# Patient Record
Sex: Female | Born: 1940
Health system: Southern US, Community
[De-identification: ages and names within clinical notes are randomized; demographics above are authoritative.]

## PROBLEM LIST (undated history)

## (undated) DIAGNOSIS — I219 Acute myocardial infarction, unspecified: Secondary | ICD-10-CM

## (undated) DIAGNOSIS — C799 Secondary malignant neoplasm of unspecified site: Secondary | ICD-10-CM

## (undated) DIAGNOSIS — M1990 Unspecified osteoarthritis, unspecified site: Secondary | ICD-10-CM

## (undated) DIAGNOSIS — I2510 Atherosclerotic heart disease of native coronary artery without angina pectoris: Secondary | ICD-10-CM

## (undated) DIAGNOSIS — C50412 Malignant neoplasm of upper-outer quadrant of left female breast: Secondary | ICD-10-CM

## (undated) DIAGNOSIS — I10 Essential (primary) hypertension: Secondary | ICD-10-CM

## (undated) DIAGNOSIS — E785 Hyperlipidemia, unspecified: Secondary | ICD-10-CM

## (undated) DIAGNOSIS — I739 Peripheral vascular disease, unspecified: Secondary | ICD-10-CM

## (undated) HISTORY — PX: KNEE ARTHROSCOPY: SUR90

## (undated) HISTORY — DX: Malignant neoplasm of upper-outer quadrant of left female breast: C50.412

## (undated) HISTORY — DX: Atherosclerotic heart disease of native coronary artery without angina pectoris: I25.10

## (undated) HISTORY — DX: Unspecified osteoarthritis, unspecified site: M19.90

---

## 1980-11-25 HISTORY — PX: ABDOMINAL HYSTERECTOMY: SHX81

## 1998-11-25 HISTORY — PX: NEPHRECTOMY LIVING DONOR: SUR877

## 2000-08-26 ENCOUNTER — Ambulatory Visit (HOSPITAL_COMMUNITY): Admission: RE | Admit: 2000-08-26 | Discharge: 2000-08-26 | Payer: Self-pay | Admitting: Gastroenterology

## 2000-08-26 ENCOUNTER — Encounter (INDEPENDENT_AMBULATORY_CARE_PROVIDER_SITE_OTHER): Payer: Self-pay

## 2003-09-26 ENCOUNTER — Emergency Department (HOSPITAL_COMMUNITY): Admission: EM | Admit: 2003-09-26 | Discharge: 2003-09-27 | Payer: Self-pay | Admitting: Emergency Medicine

## 2003-10-01 ENCOUNTER — Emergency Department (HOSPITAL_COMMUNITY): Admission: EM | Admit: 2003-10-01 | Discharge: 2003-10-01 | Payer: Self-pay | Admitting: Emergency Medicine

## 2003-11-25 ENCOUNTER — Inpatient Hospital Stay (HOSPITAL_COMMUNITY): Admission: EM | Admit: 2003-11-25 | Discharge: 2003-11-29 | Payer: Self-pay

## 2003-11-26 HISTORY — PX: PERCUTANEOUS CORONARY STENT INTERVENTION (PCI-S): SHX6016

## 2004-04-07 ENCOUNTER — Encounter: Admission: RE | Admit: 2004-04-07 | Discharge: 2004-04-07 | Payer: Self-pay | Admitting: Orthopedic Surgery

## 2004-11-22 DIAGNOSIS — I219 Acute myocardial infarction, unspecified: Secondary | ICD-10-CM

## 2004-11-22 HISTORY — DX: Acute myocardial infarction, unspecified: I21.9

## 2008-12-27 ENCOUNTER — Ambulatory Visit: Payer: Self-pay | Admitting: Vascular Surgery

## 2010-05-01 ENCOUNTER — Emergency Department (HOSPITAL_COMMUNITY): Admission: EM | Admit: 2010-05-01 | Discharge: 2010-05-01 | Payer: Self-pay | Admitting: Emergency Medicine

## 2011-01-07 ENCOUNTER — Other Ambulatory Visit (INDEPENDENT_AMBULATORY_CARE_PROVIDER_SITE_OTHER): Payer: BC Managed Care – PPO

## 2011-01-07 DIAGNOSIS — I6529 Occlusion and stenosis of unspecified carotid artery: Secondary | ICD-10-CM

## 2011-01-15 NOTE — Procedures (Unsigned)
CAROTID DUPLEX EXAM  INDICATION:  Followup carotid disease.  HISTORY: Diabetes:  No. Cardiac:  Yes. Hypertension:  Yes. Smoking:  Previous. Previous Surgery:  No. CV History: Amaurosis Fugax No, Paresthesias No, Hemiparesis No                                      RIGHT             LEFT Brachial systolic pressure:         108               104 Brachial Doppler waveforms:         WNL               WNL Vertebral direction of flow:        Antegrade         Antegrade DUPLEX VELOCITIES (cm/sec) CCA peak systolic                   84                83 ECA peak systolic                   295               297 ICA peak systolic                   95                79 ICA end diastolic                   32                27 PLAQUE MORPHOLOGY:                  Heterogeneous     Heterogeneous PLAQUE AMOUNT:                      Moderate          Moderate PLAQUE LOCATION:                    CCA/ECA           CCA/ECA  IMPRESSION: 1. 1% to 39% bilateral internal carotid artery plaquing. 2. Abnormal bilateral external carotid artery. 3. Bilateral vertebral arteries are within normal limits.  ___________________________________________ V. Charlena Cross, MD  LT/MEDQ  D:  01/07/2011  T:  01/07/2011  Job:  [id number]

## 2011-02-11 LAB — CBC
HCT: 43.1 % (ref 36.0–46.0)
Hemoglobin: 14.4 g/dL (ref 12.0–15.0)
MCHC: 33.5 g/dL (ref 30.0–36.0)
MCV: 90.5 fL (ref 78.0–100.0)
Platelets: 167 10*3/uL (ref 150–400)
RBC: 4.77 MIL/uL (ref 3.87–5.11)
RDW: 13.5 % (ref 11.5–15.5)
WBC: 6.1 10*3/uL (ref 4.0–10.5)

## 2011-02-11 LAB — DIFFERENTIAL
Basophils Absolute: 0.1 10*3/uL (ref 0.0–0.1)
Basophils Relative: 1 % (ref 0–1)
Eosinophils Absolute: 0.1 10*3/uL (ref 0.0–0.7)
Eosinophils Relative: 1 % (ref 0–5)
Lymphocytes Relative: 26 % (ref 12–46)
Lymphs Abs: 1.6 10*3/uL (ref 0.7–4.0)
Monocytes Absolute: 0.5 10*3/uL (ref 0.1–1.0)
Monocytes Relative: 9 % (ref 3–12)
Neutro Abs: 3.8 10*3/uL (ref 1.7–7.7)
Neutrophils Relative %: 64 % (ref 43–77)

## 2011-02-11 LAB — COMPREHENSIVE METABOLIC PANEL
ALT: 28 U/L (ref 0–35)
AST: 30 U/L (ref 0–37)
Albumin: 3.7 g/dL (ref 3.5–5.2)
Alkaline Phosphatase: 52 U/L (ref 39–117)
BUN: 25 mg/dL — ABNORMAL HIGH (ref 6–23)
CO2: 24 mEq/L (ref 19–32)
Calcium: 8.9 mg/dL (ref 8.4–10.5)
Chloride: 108 mEq/L (ref 96–112)
Creatinine, Ser: 0.97 mg/dL (ref 0.4–1.2)
GFR calc Af Amer: >60 mL/min (ref >60)
GFR calc non Af Amer: 57 mL/min — ABNORMAL LOW (ref >60)
Glucose, Bld: 115 mg/dL — ABNORMAL HIGH (ref 70–99)
Potassium: 4.1 mEq/L (ref 3.5–5.1)
Sodium: 138 mEq/L (ref 135–145)
Total Bilirubin: 1 mg/dL (ref 0.3–1.2)
Total Protein: 6.2 g/dL (ref 6.0–8.3)

## 2011-02-11 LAB — CK: Total CK: 174 U/L (ref 7–177)

## 2011-04-09 NOTE — Procedures (Signed)
CAROTID DUPLEX EXAM   INDICATION:  Follow-up evaluation of known carotid artery disease.   HISTORY:  Diabetes:  No.  Cardiac:  MI with PTCA in 11/2003.  Hypertension:  Medically controlled.  Smoking:  No.  Previous Surgery:  No.  CV History:  Previous duplex on 11/19/06 revealed less than 40% ICA  stenosis bilaterally.  Amaurosis Fugax No, Paresthesias No, Hemiparesis No.                                       RIGHT             LEFT  Brachial systolic pressure:         120               116  Brachial Doppler waveforms:         Triphasic         Triphasic  Vertebral direction of flow:        Antegrade         Antegrade  DUPLEX VELOCITIES (cm/sec)  CCA peak systolic                   66                140  ECA peak systolic                   360               202  ICA peak systolic                   104               127  ICA end diastolic                   42                43  PLAQUE MORPHOLOGY:                  Mixed, irregular  Calcified  PLAQUE AMOUNT:                      Mild-to-moderate  Moderate  PLAQUE LOCATION:                    Proximal ICA      Proximal ICA, ECA   IMPRESSION:  1. Bilateral external carotid artery stenosis, right worse than left.  2. 20-39% right internal carotid artery stenosis.  3. 40-59% left internal carotid artery stenosis.   ___________________________________________  Wendy [name] [place], M.D.   MC/MEDQ  D:  12/27/2008  T:  12/27/2008  Job:  [id number]

## 2011-04-12 NOTE — Cardiovascular Report (Signed)
Wendy [name], Wendy [name] NO.:  [id number]   MEDICAL RECORD NO.:  [id number]                   PATIENT TYPE:  OBV   LOCATION:  2023                                 FACILITY:  [facility]   PHYSICIAN:  W. Ashley Royalty., M.D.         DATE OF BIRTH:  October 03, 1941   DATE OF PROCEDURE:  11/28/2003  DATE OF DISCHARGE:                              CARDIAC CATHETERIZATION   HISTORY:  A 70 year old female with unstable angina.  She also has  significant peripheral vascular disease with previous hyperlipidemia and  coronary artery disease and hypertension.   PROCEDURE:  Left heart catheterization with coronary angiograms and left  ventriculogram, stenting of the right coronary artery.   DESCRIPTION OF PROCEDURE:  The patient was brought to the cath lab and was  prepped and draped in the usual manner.  After Xylocaine anesthesia, a 6-  French sheath was placed in the right femoral artery percutaneously.  Angiograms were made using 6-French catheters and a 30-mL ventriculogram was  performed.  She was found to have two severe stenoses present within the  right coronary artery and arrangements were made to do stenting.  The sheath  was upgraded to a 7-French sheath and a second IV was started when the  previously placed heparin well would not work.  She was treated with  Angiomax achieving an ACT of greater than 200.  IV nitroglycerin was  administered.  Catheters used were a 7-French JR-3.5 guiding catheter with  side holes, HTF __________ guide wire that crossed the lesion easily.  The  two tandem lesions in the right coronary artery were predilated with a 2.5 x  12-mm Voyager balloon.  A 2.75 x 32-mm stent was placed across both stenoses  and was dilated to 9 atmospheres twice.  She had significant chest  discomfort that persisted throughout this.  The vessel was patent at that  point and the lesions were predilated with a 3.25 x 8--mm balloon at the  proximal  portion of the stent.  A 3.25 x 15-mm balloon was deployed to only  7 atmospheres in the proximal  mid portion of the stent and then a 2.75 x 15-  mm balloon was deployed through the length of the vessel to 14 atmospheres.  She was then administered IV nitroglycerin.  Her pain improved and post  dilatation angiograms were obtained.  She was thought to have a small  occlusion of the very distal right coronary artery that was noticed on the  post dilatation angiograms which may have been due to an embolus and she was  treated with Integrilin with single bolus and then constant infusion  following the procedure.  300 mg of Plavix was administered prior to the  procedure.  She left the catheterization lab in stable condition.   HEMODYNAMIC DATA:  1. Aorta postcontrast:  117/60.  2. LV postcontrast:  117/11-14.   ANGIOGRAPHIC DATA:  Left ventriculogram:  Performed in the  30 degree RAO  projection.  The aortic valve was normal.  The mitral valve was normal.  The  left ventricle appears normal in size.  Estimated ejection fraction was 60%.  Coronary arteries arise and distribute normally.  Mild calcification is  noted at the ostium of the right coronary artery and very mild calcification  noted in the left coronary system.  Left main coronary artery is normal.   Left anterior descending:  There is  a segmental 70-80% stenosis in a  somewhat small vessel after the diagonal branch.  Distal vessel is somewhat  small and is likely less than 2.5 mm.   The circumflex coronary artery has two marginal branches which appear free  of disease and contains only scattered irregularities.   The right coronary artery is a dominant vessel and contains a severe  segmental 10-12 mm proximal stenosis estimated at 80%.  This is followed by  a tandem 95% stenosis.  There is mild disease noted in the distal vessel.   Post dilatation angiograms reveal very minimal 10% narrowing at the very  proximal portion of  the vessel where the stent was overdilated and 0%  residual of the distal vessel.  Previous lesions sites are also 0%.  There  appears to be a very distal occlusion of the posterior descending artery  very distal at the apex.   IMPRESSION:  1. Successful stenting of the right coronary artery tandem lesions with     stenosis going from 80% to 0% and 95% to 0%.  2. Normal left ventricular function.  3. Residual atherosclerotic disease involving the left anterior descending     which likely will be addressed at a later date.                                               Darden Palmer., M.D.    WST/MEDQ  D:  11/28/2003  T:  11/28/2003  Job:  [id number]

## 2011-04-12 NOTE — Op Note (Signed)
[facility]  Patient:    Wendy [name], TROUTEN                       MRN: [id number] Proc. Date: 08/26/00 Adm. Date:  [id number] Attending:  Deneen Harts CC:         Elfredia Nevins, M.D., [place]   Operative Report  PROCEDURE:  Colonoscopic polypectomy  ENDOSCOPIST:  Griffith Citron, M.D.  INDICATIONS:  A 70 year old white female undergoing colonoscopy for surveillance purposes.  She has a sister diagnosed with colon cancer 21 years ago at the age of 75.  The patient has not undergone surveillance, asymptomatic.  DESCRIPTION OF PROCEDURE:  After reviewing the nature of the procedure with the patient including potential risks and complications, and after discussing alternative methods of diagnosis and treatment, informed consent was signed.  The patient was premedicated, receiving IV sedation totalling Versed 12 mg, fentanyl 125 mcg administered in divided doses prior to and during the course of the procedure.  The procedure was initiated using an Olympus pediatric PCF-140L video colonoscope.  This as inserted in the rectum after normal digital examination which revealed no evidence of perianal or intrarectal pathology.   The scope was could not be advanced beyond the splenic flexure due to the very large caliber of the colon, the patients obesity, and looping.  After 15 minutes, it was elected to exchange the pediatric for a standard caliber CF-140 scope. The intubation was repeated, and the scope could be advanced around the entire length of the colon to the cecum, although it was extremely difficulty, again due to the large caliber of the colon lumen, difficulty applying external pressure because of obesity, and a very tortuous colon.  The scope was then slowly withdrawn.  Multiple polyps were identified, three sessile polyps located in the hepatic flexure, splenic flexure, and sigmoid colon, each approximately 8 mm in diameter, benign  adenomatous in appearance.  These were resected with electrocautery snare, recovered, and submitted to pathology. Three additional diminutive polyps, hyperplastic appearing, were resected using hot biopsy forceps.  These, too, were distributed in the hepatic and splenic flexures and sigmoid colon.  No additional abnormality was noted. Retroflex view of the rectal vault was normal.  The colon was decompressed, scope withdrawn.  Procedure time was 1 hour and 15 minutes.  The patient tolerated this without difficulty, being maintained on Datascope monitor and low-flow oxygen.  She returned to the recovery room in stable condition.  Time 3, technical 3, preparation 1, total score equal 7.  ASSESSMENT:  Polyps, benign appearing, all resected and sent to pathology.  RECOMMENDATIONS: 1. Post polypectomy instructions were reviewed with the patient. 2. Follow up pathology. 3. Repeat colonoscopy in three years if adenomatous polyp identified on    pathology. DD:  08/26/00 TD:  08/26/00 Job: [id number]

## 2011-04-12 NOTE — H&P (Signed)
NAME:  Wendy [name], Wendy [name] NO.:  [id number]   MEDICAL RECORD NO.:  [id number]                   PATIENT TYPE:  OBV   LOCATION:  6524                                 FACILITY:  [facility]   PHYSICIAN:  Georga Hacking, M.D.             DATE OF BIRTH:  03/11/1941   DATE OF ADMISSION:  11/24/2003  DATE OF DISCHARGE:                                HISTORY & PHYSICAL   HISTORY OF PRESENT ILLNESS:  Wendy [name] is a 70 year old white woman  who is admitted to [facility] for further evaluation of chest pain.   The patient, who has no past history of cardiac disease, presented to the  emergency department this evening after experiencing an episode of chest  pain.  This was actually her fifth episode of chest pain in the last two  weeks.  The first three episodes occurred during the last two weeks.  They  were precipitated by activity such as walking.  In each instance, she  stopped the activity and the chest pain resolved within 15 minutes.  Yesterday, she experienced an episode of chest pain at rest.  It resolved  within 15 minutes.  Tonight, she experienced a fifth episode of chest pain;  this also occurred at rest, but took approximately 45 minutes to resolve.  It was somewhat more intense than the other episodes.  The chest pain is  described as a pressure in the left anterior chest.  It radiates to the left  arm.  It is also associated with a discomfort in her teeth.  There are no  exacerbating or ameliorating factors.  It appears not to be related to  position, meals, or respirations.  She is free of chest pain at this time.  There has been no associated dyspnea, diaphoresis, or nausea.  Tonight's  episode was ameliorated by the two nitroglycerin tablets administered by  EMS.   As noted above, the patient has no past history of cardiac disease,  including no history of chest pain, myocardial infarction, coronary artery  disease, congestive heart  failure, or arrhythmia.  She has a history of  hypertension and is on hydrochlorothiazide and Diovan.  There is no history  of hyperlipidemia, diabetes mellitus, or smoking.  There is a family history  of early coronary artery disease; her father died of a myocardial infarction  in his early 23s.   MEDICATIONS:  The patient takes Diovan and hydrochlorothiazide as described  above.   ALLERGIES:  She is not allergic to any medications.   SOCIAL HISTORY:  The patient lives alone.  She works at [employer].  She does not smoke, as described above.  She drinks alcohol very  rarely.   PAST SURGICAL HISTORY:  She has previously undergone a hysterectomy.  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   SIGNIFICANT INJURIES:  None.   FAMILY HISTORY:  Heart disease  in her father, as described above.  Her  mother died of emphysema.  She has two sisters who have diabetes mellitus.   REVIEW OF SYSTEMS:  Review of systems reveals no problems related to her  head, eyes, ears, nose, mouth, throat, lungs, gastrointestinal system,  genitourinary system, or extremities.  There is no history of neurologic or  psychiatric disorder.  There is no history of fever, chills, or weight loss.   PHYSICAL EXAMINATION:  VITAL SIGNS:  Blood pressure 117/43.  Pulse 88 and  regular.  Respirations 20.  Temperature 97.1.  GENERAL:  The patient was an older white woman in no discomfort.  She was  alert, oriented, appropriate, and responsive.  HEENT:  Head, eyes, nose, and mouth were normal.  NECK:  The neck was without thyromegaly or adenopathy.  Carotid pulses were  palpable bilaterally; soft bruits were heard bilaterally.  CARDIAC:  Examination revealed a normal S1 and S2.  There was no S3, S4,  murmur, rub, or click.  Cardiac rhythm was regular.  No chest wall  tenderness was noted.  LUNGS:  The lungs were clear.  ABDOMEN:  The abdomen was soft and nontender.  There was no mass,   hepatosplenomegaly, bruit, distention, rebound, guarding or rigidity.  Bowel  sounds were normal.  BREASTS, PELVIC, AND RECTAL:  Examinations were not performed as they were  not pertinent to the reason for acute care hospitalization.  EXTREMITIES:  The extremities were without edema, deviation, or deformity.  Radial and dorsalis pedal pulses were palpable bilaterally.  NEUROLOGIC:  Brief screening neurologic survey was unremarkable.   LABORATORY AND ACCESSORY CLINICAL DATA:  The electrocardiogram was normal.   The chest radiograph, according to the radiologist, demonstrated no evidence  of acute disease.   The initial set of cardiac markers revealed a myoglobin of 80.4, CK-MB 2.8,  and troponin less than 0.05.  The second set revealed a myoglobin of 83.0,  CK-MB 2.6, and troponin less than 0.05.  Potassium was 3.4, BUN 20, and  creatinine 1.0.  White count was 5.3 with a hemoglobin of 13.0 and  hematocrit of 38.0.  The remaining studies were pending at the time of this  dictation.   IMPRESSION:  1. Chest pain, rule out unstable angina.  The history is consistent with     cardiac ischemia.  2. Bilateral carotid bruits.  3. Hypertension.   PLAN:  1. Telemetry.  2. Serial cardiac enzymes.  3. Aspirin.  4. Heparin.  5. Intravenous nitroglycerin.  6. Metoprolol.  7. Fasting lipid profile.  8. Carotid Dopplers.  9. Further measures per Dr. Lacretia Nicks. Viann Fish.      Quita Skye. Waldon Reining, MD                   Georga Hacking, M.D.    MSC/MEDQ  D:  11/25/2003  T:  11/25/2003  Job:  [id number]   cc:   W. Ashley Royalty., M.D.  [address]  [place]  [place] [address]  Fax: [phone]

## 2011-04-12 NOTE — Discharge Summary (Signed)
NAMEARIELLAH, Wendy [name]                        ACCOUNT NO.:  [id number]   MEDICAL RECORD NO.:  [id number]                   PATIENT TYPE:  INP   LOCATION:  6525                                 FACILITY:  [facility]   PHYSICIAN:  Darden Palmer., M.D.         DATE OF BIRTH:  November 06, 1941   DATE OF ADMISSION:  11/24/2003  DATE OF DISCHARGE:  11/29/2003                                 DISCHARGE SUMMARY   FINAL DIAGNOSES:  1. 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     a. Status post stenting of the right coronary artery with a 2.75 x 32 mm        TAXUS drug-eluting stent.     b. Residual disease involving the left anterior descending of 80% after        the first diagonal branch.  2. Hypertension.  3. Hyperlipidemia.  4. Glucose intolerance.  5. Obesity.  6. 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  7. Peripheral vascular disease with carotid bruits-carotid Dopplers done     this admission.   PROCEDURE:  Cardiac catheterization with stenting of the right coronary  artery.   HISTORY:  A 70 year old female who has had recently diagnosed hypertension.  She developed chest discomfort with exertion, described as pressure-type  with some heaviness and some radiation to the left arm and her teeth.  She  had discomfort at rest.  It took 45 minutes to resolve and was more intense  in other episodes and presented to the emergency room where she was admitted  by the covering doctor for our call group.  Please see the previously  dictated History and Physical for the remainder of the details.   LABORATORY DATA:  Cholesterol 203, HDL 31, LDL 119.  Glucohemoglobin was  5.8.  Hemoglobin 13, hematocrit 38, platelet count was normal.  Potassium  3.4 on admission with a glucose of 128.  Subsequent glucoses remained about  100 at fasting levels.  CPK was 253 on admission with an MB of 6.6, troponin  was 0.08.  Troponin rose to 0.19.   HOSPITAL COURSE:  She  was placed on Lovenox as well as IV nitroglycerin and  did have some recurrent chest discomfort.  EKG remains normal.  She was  observed in the hospital and underwent cardiac catheterization on November 28, 2003.  Left ventricular function was normal.  Left main coronary was normal.  Left anterior descending had a segmental 70% stenosis after the diagonal  branch.  The vessel was somewhat small.  The diagonal branch had a 50%  proximal stenosis.  Circumflex was free of disease.  Right coronary artery  had a segmental 80% proximal stenosis and a 95% midvessel stenosis.  It was  subtotal.  The artery was a large dominant vessel.  She had stenting with a  32 x 2.75 mm TAXUS drug-eluting stent and then was postdilated.  She had an  excellent angiographic result.  She was found to have occlusion of the very  distal portion of the posterior descending artery which likely was a very  small embolus occurring at the time of the stent or angioplasty which she  tolerated well and was pain-free at the end of the procedure.  She had very  minimal elevation of MB at 5 units the next day.  EKG remained normal.  She  was asymptomatic and ambulatory in the hall.  She was treated with Plavix  and continued on Diovan and begun on Lipitor while in the hospital.  It was  elected to discharge her home and continue medical therapy to the LAD  stenosis, but if she is ischemic or has recurrent symptoms, consider  stenting of this vessel also.  She was seen by the financial counselor and  the social worker as her insurance is unknown.   DISCHARGE CONDITION:  She was discharged in improved condition the next day.  Cauterization site was normal.   DISCHARGE INSTRUCTIONS:  She was to stay out of work for one week.  She is  to report any recurrence of angina and is to call if there are problems.   DISCHARGE MEDICATIONS:  1. Lipitor 20 mg daily.  2. Aspirin 81 mg daily.  3. Plavix 75 mg daily.  4. Diovan/HCTZ  80/12.5 mg daily.  5. Nitroglycerin 1/150 sublingual p.r.n.                                                W. Ashley Royalty., M.D.    WST/MEDQ  D:  11/29/2003  T:  11/29/2003  Job:  [id number]

## 2013-09-28 ENCOUNTER — Other Ambulatory Visit (HOSPITAL_COMMUNITY): Payer: Self-pay | Admitting: Family Medicine

## 2013-09-28 DIAGNOSIS — Z139 Encounter for screening, unspecified: Secondary | ICD-10-CM

## 2013-10-05 ENCOUNTER — Ambulatory Visit (HOSPITAL_COMMUNITY)
Admission: RE | Admit: 2013-10-05 | Discharge: 2013-10-05 | Disposition: A | Discharge status: Home / Self Care | Payer: 59 | Source: Ambulatory Visit | Attending: Family Medicine | Admitting: Family Medicine

## 2013-10-05 ENCOUNTER — Other Ambulatory Visit (HOSPITAL_COMMUNITY): Payer: Self-pay | Admitting: Family Medicine

## 2013-10-05 DIAGNOSIS — Z139 Encounter for screening, unspecified: Secondary | ICD-10-CM

## 2013-10-05 DIAGNOSIS — N63 Unspecified lump in unspecified breast: Secondary | ICD-10-CM

## 2013-10-27 ENCOUNTER — Other Ambulatory Visit (HOSPITAL_COMMUNITY): Payer: Self-pay | Admitting: Family Medicine

## 2013-10-27 ENCOUNTER — Ambulatory Visit (HOSPITAL_COMMUNITY)
Admission: RE | Admit: 2013-10-27 | Discharge: 2013-10-27 | Disposition: A | Discharge status: Home / Self Care | Payer: 59 | Source: Ambulatory Visit | Attending: Family Medicine | Admitting: Family Medicine

## 2013-10-27 ENCOUNTER — Encounter (HOSPITAL_COMMUNITY): Payer: Self-pay

## 2013-10-27 DIAGNOSIS — Z139 Encounter for screening, unspecified: Secondary | ICD-10-CM

## 2013-10-27 DIAGNOSIS — N6320 Unspecified lump in the left breast, unspecified quadrant: Secondary | ICD-10-CM

## 2013-10-27 DIAGNOSIS — C50919 Malignant neoplasm of unspecified site of unspecified female breast: Secondary | ICD-10-CM | POA: Insufficient documentation

## 2013-10-27 DIAGNOSIS — C773 Secondary and unspecified malignant neoplasm of axilla and upper limb lymph nodes: Secondary | ICD-10-CM | POA: Insufficient documentation

## 2013-10-27 DIAGNOSIS — D0590 Unspecified type of carcinoma in situ of unspecified breast: Secondary | ICD-10-CM | POA: Insufficient documentation

## 2013-10-27 DIAGNOSIS — N63 Unspecified lump in unspecified breast: Secondary | ICD-10-CM

## 2013-10-27 HISTORY — DX: Essential (primary) hypertension: I10

## 2013-10-27 MED ORDER — LIDOCAINE HCL (PF) 2 % IJ SOLN
INTRAMUSCULAR | Status: AC
  Filled 2013-10-27: qty 10

## 2013-10-27 MED ORDER — LIDOCAINE HCL (PF) 1 % IJ SOLN
INTRAMUSCULAR | Status: AC
  Administered 2013-10-27: 5 mL
  Filled 2013-10-27: qty 5

## 2013-10-27 MED ORDER — LIDOCAINE HCL 1 % IJ SOLN
5.0000 mL | Freq: Once | INTRAMUSCULAR | Status: AC
  Filled 2013-10-27: qty 5

## 2013-10-27 MED ORDER — LIDOCAINE HCL (PF) 2 % IJ SOLN
10.0000 mL | Freq: Once | INTRAMUSCULAR | Status: AC
  Administered 2013-10-27: 10 mL

## 2013-10-28 ENCOUNTER — Other Ambulatory Visit (HOSPITAL_COMMUNITY): Payer: 59

## 2013-10-29 ENCOUNTER — Telehealth: Payer: Self-pay | Admitting: *Deleted

## 2013-10-29 ENCOUNTER — Other Ambulatory Visit: Payer: Self-pay | Admitting: Family Medicine

## 2013-10-29 DIAGNOSIS — C50412 Malignant neoplasm of upper-outer quadrant of left female breast: Secondary | ICD-10-CM | POA: Insufficient documentation

## 2013-10-29 DIAGNOSIS — C50919 Malignant neoplasm of unspecified site of unspecified female breast: Secondary | ICD-10-CM

## 2013-10-29 DIAGNOSIS — Z170 Estrogen receptor positive status [ER+]: Secondary | ICD-10-CM | POA: Insufficient documentation

## 2013-10-29 NOTE — Telephone Encounter (Signed)
Spoke to pt and gave appt date and time to see Drs. Welton Flakes and [place] on 11/03/13 at 3:00pm.  Requested pt to be here at 2:30 for registration and lab.  Received verbal understanding.  Gave pt instructions and directions for cancer center.  Gave pt contact information.  Pt denies further needs at this time.

## 2013-11-01 ENCOUNTER — Encounter: Payer: Self-pay | Admitting: *Deleted

## 2013-11-01 NOTE — Progress Notes (Signed)
Received paperwork on my desk for pt to be seen Wed. 12/10 in a mini [facility].  Created charts for both Drs. Welton Flakes and [place].

## 2013-11-02 ENCOUNTER — Encounter (INDEPENDENT_AMBULATORY_CARE_PROVIDER_SITE_OTHER): Payer: 59 | Admitting: General Surgery

## 2013-11-03 ENCOUNTER — Ambulatory Visit: Payer: 59 | Admitting: Oncology

## 2013-11-03 ENCOUNTER — Other Ambulatory Visit (HOSPITAL_BASED_OUTPATIENT_CLINIC_OR_DEPARTMENT_OTHER): Payer: 59

## 2013-11-03 ENCOUNTER — Ambulatory Visit: Payer: 59

## 2013-11-03 ENCOUNTER — Encounter: Payer: Self-pay | Admitting: Oncology

## 2013-11-03 ENCOUNTER — Ambulatory Visit (HOSPITAL_BASED_OUTPATIENT_CLINIC_OR_DEPARTMENT_OTHER): Payer: 59 | Admitting: Oncology

## 2013-11-03 ENCOUNTER — Ambulatory Visit
Admission: RE | Admit: 2013-11-03 | Discharge: 2013-11-03 | Disposition: A | Discharge status: Home / Self Care | Payer: 59 | Source: Ambulatory Visit | Attending: Radiation Oncology | Admitting: Radiation Oncology

## 2013-11-03 VITALS — BP 119/66 | HR 89 | Temp 97.7°F | Resp 18 | Ht 68.0 in | Wt 203.0 lb

## 2013-11-03 DIAGNOSIS — C50412 Malignant neoplasm of upper-outer quadrant of left female breast: Secondary | ICD-10-CM

## 2013-11-03 DIAGNOSIS — C50419 Malignant neoplasm of upper-outer quadrant of unspecified female breast: Secondary | ICD-10-CM

## 2013-11-03 DIAGNOSIS — Z170 Estrogen receptor positive status [ER+]: Secondary | ICD-10-CM

## 2013-11-03 DIAGNOSIS — C7931 Secondary malignant neoplasm of brain: Secondary | ICD-10-CM

## 2013-11-03 DIAGNOSIS — C50919 Malignant neoplasm of unspecified site of unspecified female breast: Secondary | ICD-10-CM

## 2013-11-03 LAB — COMPREHENSIVE METABOLIC PANEL (CC13)
ALT: 45 U/L (ref 0–55)
AST: 38 U/L — ABNORMAL HIGH (ref 5–34)
Albumin: 4.2 g/dL (ref 3.5–5.0)
Alkaline Phosphatase: 59 U/L (ref 40–150)
Anion Gap: 13 mEq/L — ABNORMAL HIGH (ref 3–11)
BUN: 25.2 mg/dL (ref 7.0–26.0)
CO2: 27 mEq/L (ref 22–29)
Calcium: 10.4 mg/dL (ref 8.4–10.4)
Chloride: 103 mEq/L (ref 98–109)
Creatinine: 1.1 mg/dL (ref 0.6–1.1)
Glucose: 93 mg/dl (ref 70–140)
Potassium: 4.1 mEq/L (ref 3.5–5.1)
Sodium: 142 mEq/L (ref 136–145)
Total Bilirubin: 0.77 mg/dL (ref 0.20–1.20)
Total Protein: 6.9 g/dL (ref 6.4–8.3)

## 2013-11-03 LAB — CBC WITH DIFFERENTIAL/PLATELET
BASO%: 1 % (ref 0.0–2.0)
Basophils Absolute: 0.1 10*3/uL (ref 0.0–0.1)
EOS%: 4.6 % (ref 0.0–7.0)
Eosinophils Absolute: 0.3 10*3/uL (ref 0.0–0.5)
HCT: 38.3 % (ref 34.8–46.6)
HGB: 13 g/dL (ref 11.6–15.9)
LYMPH%: 33.4 % (ref 14.0–49.7)
MCH: 30.9 pg (ref 25.1–34.0)
MCHC: 34 g/dL (ref 31.5–36.0)
MCV: 91 fL (ref 79.5–101.0)
MONO#: 0.7 10*3/uL (ref 0.1–0.9)
MONO%: 12.3 % (ref 0.0–14.0)
NEUT#: 2.7 10*3/uL (ref 1.5–6.5)
NEUT%: 48.7 % (ref 38.4–76.8)
Platelets: 211 10*3/uL (ref 145–400)
RBC: 4.21 10*6/uL (ref 3.70–5.45)
RDW: 14.1 % (ref 11.2–14.5)
WBC: 5.5 10*3/uL (ref 3.9–10.3)
lymph#: 1.8 10*3/uL (ref 0.9–3.3)

## 2013-11-03 NOTE — Progress Notes (Signed)
Checked in new patient with no financial issues. She has appt card and I gave her a [facility] packet. °

## 2013-11-03 NOTE — Progress Notes (Signed)
Wendy [name] [id number] 23-Oct-1941 72 y.o. 11/03/2013 5:19 PM  CC  Delorse Lek, MD [address] [place] [address] Dr. Lurline Hare Dr. [name]  REASON FOR CONSULTATION:  72 year old female with new diagnosis of invasive ductal carcinoma of the left breast found on self examination. Patient is seen in the multidisciplinary breast clinic for discussion of treatment options.  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 [name]  HISTORY OF PRESENT ILLNESS:  Wendy [name] is a 73 y.o. female.  Without significant past medical history who palpated a mass in her left breast. Because of this she had a diagnostic mammogram and ultrasound performed on 10/27/2013. This showed a spiculated mass in the 6:00 position measuring approximately 3 cm. Ultrasound confirmed the hypoechoic mass at the 6:00 position measuring 2.6 x 2.1 x 1.5 cm. There was also noted to be a second mass in the 12:00 position measuring 2.1 x 1.8 x 2.1 cm. 2 abnormal lymph nodes were noted in the left axilla largest one measuring 1.2 cm. Patient had a biopsy of the primary breast masses. She also had a biopsy of the lymph node. All 3 biopsies were positive for invasive ductal carcinoma. Tumor was ER 100% PR 100% positive HER-2/neu negative with a proliferation marker Ki-67 low at 14% and low. Patient was seen in the multidisciplinary breast clinic for discussion of treatment options. She is otherwise without any complaints. Her pathology has been reviewed as well as radiology.   Past Medical History: Past Medical History  Diagnosis Date  . Hypertension     Past Surgical History: History reviewed. No pertinent past surgical history.  Family History: History reviewed. No pertinent family history.  Social History History  Substance Use Topics  . Smoking status: Never Smoker   .  Smokeless tobacco: Never Used  . Alcohol Use: No    Allergies: No Known Allergies  Current Medications: No current outpatient prescriptions on file.   No current facility-administered medications for this visit.    OB/GYN History: Menarche at age 5 patient has been on hormone replacement therapy for about 6 months in 1982. First live birth at 33 she said 3 term births. She did take oral contraceptive pills.  Fertility Discussion: Not applicable Prior History of Cancer: No  Health Maintenance:  Colonoscopy yes 2013 Bone Density no Last PAP smear 1982  ECOG PERFORMANCE STATUS: 0 - Asymptomatic  Genetic Counseling/testing: no  REVIEW OF SYSTEMS:  A comprehensive review of systems was negative.  PHYSICAL EXAMINATION: Blood pressure 119/66, pulse 89, temperature 97.7 F (36.5 C), temperature source Oral, resp. rate 18, height 5\' 8"  (1.727 m), weight 203 lb (92.08 kg).  NWG:NFAOZ, healthy, no distress, well nourished and well developed SKIN: skin color, texture, turgor are normal HEAD: Normocephalic EYES: PERRLA, EOMI, Conjunctiva are pink and non-injected EARS: External ears normal OROPHARYNX:no exudate, no erythema and lips, buccal mucosa, and tongue normal  NECK: supple, no adenopathy LYMPH:  no palpable lymphadenopathy, no hepatosplenomegaly BREAST:right breast normal without mass, skin or nipple changes or axillary nodes, abnormal mass palpable in the 6:00 position there is some tenderness as well secondary to the biopsy LUNGS: clear to auscultation and percussion HEART: regular rate & rhythm ABDOMEN:abdomen soft, non-tender, normal bowel sounds and no masses or organomegaly BACK: No CVA tenderness EXTREMITIES:less then 2 second capillary refill  NEURO: alert & oriented x 3 with fluent speech, no focal  motor/sensory deficits, gait normal, reflexes normal and symmetric     STUDIES/RESULTS: US Breast Left  10/30/13   CLINICAL DATA:  Patient with left breast  lump.  EXAM: DIGITAL DIAGNOSTIC  BILATERAL MAMMOGRAM WITH CAD  ULTRASOUND LEFT BREAST  COMPARISON:  08/08/2000  ACR Breast Density Category c: The breast tissue is heterogeneously dense, which may obscure small masses.  FINDINGS: A spiculated mass is identified at 6 o'clock in left breast cyst, measuring up to 3 cm. There is associated architectural distortion and nipple inversion. Additionally, the patient has bilateral circumscribed masses, which are similar to the prior examination.  Mammographic images were processed with CAD.  On physical exam,both nipples are inverted, which the patient states she has had for over 30 years. Firm tissue is palpated at the 6 o'clock, retroareolar, and 12 o'clock positions in the left breast.  Targeted ultrasound demonstrates an irregular hypoechoic mass at 6 o'clock, 2 cm from the nipple. This measures 2.6 x 2.1 x 1.5 cm. At 12 o'clock, 2 cm from the nipple, there is an ill-defined mixed echogenicity mass measuring 2.1 x 1.8 x 2.1 cm. Ultrasound examination of the left axilla demonstrates 2 morphologically abnormal lymph nodes, without fatty hila. The larger node measures up to 1.2 cm in short axis.  Following ultrasound guided biopsy of the left breast, the wing shaped clip at 12 o'clock an the ribbon shaped clip at 6 o'clock are in the expected locations.  IMPRESSION: 1. Suspicious left breast mass at 6:00. 2. Ill-defined mixed echogenicity mass in the left breast at 12 o'clock. 3. Abnormal left axillary lymph nodes. 4. Post biopsy clip images attached to this examination show the clips to be in the anticipated locations at 12:00 and 6:00 in the left breast.  RECOMMENDATION: Ultrasound guided core biopsy of the left breast and left axillary lymph node. This will be performed on 30-Oct-2013.  I have discussed the findings and recommendations with the patient. Results were also provided in writing at the conclusion of the visit.  BI-RADS CATEGORY  5: Highly suggestive of  malignancy - appropriate action should be taken.   Electronically Signed   By: Jerene Dilling M.D.   On: October 30, 2013 17:36   [place] Core Biopsy  10/29/2013   ADDENDUM REPORT: 10/29/2013 13:13  ADDENDUM: Pathology demonstrates the left breast mass at 6 o'clock is invasive ductal carcinoma and DCIS. 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 The biopsied left axillary lymph node is positive for metastatic disease. These results are concordant. Surgical consultation and a breast MRI are recommended. The patient is scheduled to see Dr. Abbey Chatters on 11/02/2013 at 10:30 a.m. The patient's breast MRI will be scheduled. The patient states that she has bruising and soreness of the left breast at the biopsy sites, which has improved.   Electronically Signed   By: Jerene Dilling M.D.   On: 10/29/2013 13:13   10/29/2013   CLINICAL DATA:  Left breast masses and abnormal left axillary lymph nodes.  EXAM: ULTRASOUND GUIDED LEFT BREAST CORE NEEDLE BIOPSY WITH VACUUM ASSIST  COMPARISON:  Previous exams.  PROCEDURE: I met with the patient and we discussed the procedure of ultrasound-guided biopsy, including benefits and alternatives. We discussed the high likelihood of a successful procedure. We discussed the risks of the procedure including infection, bleeding, tissue injury, clip migration, and inadequate sampling. Informed written consent was given. The usual time-out protocol was performed immediately prior to the procedure.  Using  sterile technique and 2% Lidocaine as local anesthetic, under direct ultrasound visualization, a 12 gauge vacuum-assisteddevice was used to perform biopsy of a left breast mass at 6 o'clock, a left breast mass at 12 o'clock, and a left axillary lymph node using lateral to medial approaches. At the conclusion of the procedure, a wing shaped clip was deployed in the biopsy cavity at 12 o'clock and a ribbon shaped clip was  deployed in the biopsy cavity at 6 o'clock. The post biopsy mammogram has been attached to the diagnostic bilateral mammogram of the same date.  IMPRESSION: Ultrasound-guided biopsy of 2 left breast masses and a left axillary lymph node. No apparent complications.  Electronically Signed: By: Jerene Dilling M.D. On: 10/27/2013 17:44   Mm Digital Diagnostic Bilat  10/27/2013   CLINICAL DATA:  Patient with left breast lump.  EXAM: DIGITAL DIAGNOSTIC  BILATERAL MAMMOGRAM WITH CAD  ULTRASOUND LEFT BREAST  COMPARISON:  08/08/2000  ACR Breast Density Category c: The breast tissue is heterogeneously dense, which may obscure small masses.  FINDINGS: A spiculated mass is identified at 6 o'clock in left breast cyst, measuring up to 3 cm. There is associated architectural distortion and nipple inversion. Additionally, the patient has bilateral circumscribed masses, which are similar to the prior examination.  Mammographic images were processed with CAD.  On physical exam,both nipples are inverted, which the patient states she has had for over 30 years. Firm tissue is palpated at the 6 o'clock, retroareolar, and 12 o'clock positions in the left breast.  Targeted ultrasound demonstrates an irregular hypoechoic mass at 6 o'clock, 2 cm from the nipple. This measures 2.6 x 2.1 x 1.5 cm. At 12 o'clock, 2 cm from the nipple, there is an ill-defined mixed echogenicity mass measuring 2.1 x 1.8 x 2.1 cm. Ultrasound examination of the left axilla demonstrates 2 morphologically abnormal lymph nodes, without fatty hila. The larger node measures up to 1.2 cm in short axis.  Following ultrasound guided biopsy of the left breast, the wing shaped clip at 12 o'clock an the ribbon shaped clip at 6 o'clock are in the expected locations.  IMPRESSION: 1. Suspicious left breast mass at 6:00. 2. Ill-defined mixed echogenicity mass in the left breast at 12 o'clock. 3. Abnormal left axillary lymph nodes. 4. Post biopsy clip images attached to  this examination show the clips to be in the anticipated locations at 12:00 and 6:00 in the left breast.  RECOMMENDATION: Ultrasound guided core biopsy of the left breast and left axillary lymph node. This will be performed on 10/27/2013.  I have discussed the findings and recommendations with the patient. Results were also provided in writing at the conclusion of the visit.  BI-RADS CATEGORY  5: Highly suggestive of malignancy - appropriate action should be taken.   Electronically Signed   By: Jerene Dilling M.D.   On: 10/27/2013 17:36   [place] Lt Breast Bx W Loc Dev 1st Lesion Img Bx Spec US Guide  10/29/2013   ADDENDUM REPORT: 10/29/2013 13:13  ADDENDUM: Pathology demonstrates the left breast mass at 6 o'clock is invasive ductal carcinoma and DCIS. 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 The biopsied left axillary lymph node is positive for metastatic disease. These results are concordant. Surgical consultation and a breast MRI are recommended. The patient is scheduled to see Dr. Abbey Chatters on 11/02/2013 at 10:30 a.m. The patient's breast MRI will be scheduled. The patient states that she has bruising  and soreness of the left breast at the biopsy sites, which has improved.   Electronically Signed   By: Jerene Dilling M.D.   On: 10/29/2013 13:13   10/29/2013   CLINICAL DATA:  Left breast masses and abnormal left axillary lymph nodes.  EXAM: ULTRASOUND GUIDED LEFT BREAST CORE NEEDLE BIOPSY WITH VACUUM ASSIST  COMPARISON:  Previous exams.  PROCEDURE: I met with the patient and we discussed the procedure of ultrasound-guided biopsy, including benefits and alternatives. We discussed the high likelihood of a successful procedure. We discussed the risks of the procedure including infection, bleeding, tissue injury, clip migration, and inadequate sampling. Informed written consent was given. The usual time-out protocol was performed  immediately prior to the procedure.  Using sterile technique and 2% Lidocaine as local anesthetic, under direct ultrasound visualization, a 12 gauge vacuum-assisteddevice was used to perform biopsy of a left breast mass at 6 o'clock, a left breast mass at 12 o'clock, and a left axillary lymph node using lateral to medial approaches. At the conclusion of the procedure, a wing shaped clip was deployed in the biopsy cavity at 12 o'clock and a ribbon shaped clip was deployed in the biopsy cavity at 6 o'clock. The post biopsy mammogram has been attached to the diagnostic bilateral mammogram of the same date.  IMPRESSION: Ultrasound-guided biopsy of 2 left breast masses and a left axillary lymph node. No apparent complications.  Electronically Signed: By: Jerene Dilling M.D. On: 10/27/2013 17:44   [place] Lt Breast Bx W Loc Dev Ea Add Lesion Img Bx Spec US Guide  10/29/2013   ADDENDUM REPORT: 10/29/2013 13:13  ADDENDUM: Pathology demonstrates the left breast mass at 6 o'clock is invasive ductal carcinoma and DCIS. 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 The biopsied left axillary lymph node is positive for metastatic disease. These results are concordant. Surgical consultation and a breast MRI are recommended. The patient is scheduled to see Dr. Abbey Chatters on 11/02/2013 at 10:30 a.m. The patient's breast MRI will be scheduled. The patient states that she has bruising and soreness of the left breast at the biopsy sites, which has improved.   Electronically Signed   By: Jerene Dilling M.D.   On: 10/29/2013 13:13   10/29/2013   CLINICAL DATA:  Left breast masses and abnormal left axillary lymph nodes.  EXAM: ULTRASOUND GUIDED LEFT BREAST CORE NEEDLE BIOPSY WITH VACUUM ASSIST  COMPARISON:  Previous exams.  PROCEDURE: I met with the patient and we discussed the procedure of ultrasound-guided biopsy, including benefits and alternatives. We  discussed the high likelihood of a successful procedure. We discussed the risks of the procedure including infection, bleeding, tissue injury, clip migration, and inadequate sampling. Informed written consent was given. The usual time-out protocol was performed immediately prior to the procedure.  Using sterile technique and 2% Lidocaine as local anesthetic, under direct ultrasound visualization, a 12 gauge vacuum-assisteddevice was used to perform biopsy of a left breast mass at 6 o'clock, a left breast mass at 12 o'clock, and a left axillary lymph node using lateral to medial approaches. At the conclusion of the procedure, a wing shaped clip was deployed in the biopsy cavity at 12 o'clock and a ribbon shaped clip was deployed in the biopsy cavity at 6 o'clock. The post biopsy mammogram has been attached to the diagnostic bilateral mammogram of the same date.  IMPRESSION: Ultrasound-guided biopsy of 2 left breast masses and a left axillary lymph node.  No apparent complications.  Electronically Signed: By: Jerene Dilling M.D. On: 10/27/2013 17:44     LABS:    Chemistry      Component Value Date/Time   NA 142 11/03/2013 1426   NA 138 05/01/2010 0215   K 4.1 11/03/2013 1426   K 4.1 05/01/2010 0215   CL 108 05/01/2010 0215   CO2 27 11/03/2013 1426   CO2 24 05/01/2010 0215   BUN 25.2 11/03/2013 1426   BUN 25* 05/01/2010 0215   CREATININE 1.1 11/03/2013 1426   CREATININE 0.97 05/01/2010 0215      Component Value Date/Time   CALCIUM 10.4 11/03/2013 1426   CALCIUM 8.9 05/01/2010 0215   ALKPHOS 59 11/03/2013 1426   ALKPHOS 52 05/01/2010 0215   AST 38* 11/03/2013 1426   AST 30 05/01/2010 0215   ALT 45 11/03/2013 1426   ALT 28 05/01/2010 0215   BILITOT 0.77 11/03/2013 1426   BILITOT 1.0 05/01/2010 0215      Lab Results  Component Value Date   WBC 5.5 11/03/2013   HGB 13.0 11/03/2013   HCT 38.3 11/03/2013   MCV 91.0 11/03/2013   PLT 211 11/03/2013       PATHOLOGY: ADDITIONAL INFORMATION: 1.  PROGNOSTIC INDICATORS - ACIS Results: IMMUNOHISTOCHEMICAL AND MORPHOMETRIC ANALYSIS BY THE AUTOMATED CELLULAR IMAGING SYSTEM (ACIS) Estrogen Receptor: 100%, POSITIVE, STRONG STAINING INTENSITY Progesterone Receptor: 100%, POSITIVE, STRONG STAINING INTENSITY Proliferation Marker Ki67: 14% REFERENCE RANGE ESTROGEN RECEPTOR NEGATIVE <1% POSITIVE =>1% PROGESTERONE RECEPTOR NEGATIVE <1% POSITIVE =>1% All controls stained appropriately Pecola Leisure MD Pathologist, Electronic Signature ( Signed 11/03/2013) 1. CHROMOGENIC IN-SITU HYBRIDIZATION Results: HER-2/NEU BY CISH - NO AMPLIFICATION OF HER-2 DETECTED. RESULT RATIO OF HER2: CEP 17 SIGNALS 1.55 AVERAGE HER2 COPY NUMBER PER CELL 1.70 REFERENCE RANGE 1 of 3 FINAL for Wendy [name], Wendy [name] ([id number]) ADDITIONAL INFORMATION:(continued) NEGATIVE HER2/Chr17 Ratio <2.0 and Average HER2 copy number <4.0 EQUIVOCAL HER2/Chr17 Ratio <2.0 and Average HER2 copy number 4.0 and <6.0 POSITIVE HER2/Chr17 Ratio >=2.0 and/or Average HER2 copy number >=6.0 Pecola Leisure MD Pathologist, Electronic Signature ( Signed 11/02/2013) FINAL DIAGNOSIS Diagnosis 1. Breast, left, needle core biopsy, @ 12:00; 2 cm from nipple - INVASIVE DUCTAL CARCINOMA. - DUCTAL CARCINOMA IN SITU WITH CALCIFICATIONS. - FIBROCYSTIC CHANGES WITH CALCIFICATIONS. - SEE COMMENT. 2. Breast, left, needle core biopsy, 6:00 2 cm from nipple - INVASIVE DUCTAL CARCINOMA. - DUCTAL CARCINOMA IN SITU WITH CALCIFICATIONS. - LYMPHOVASCULAR INVASION IS IDENTIFIED. - FIBROCYSTIC CHANGES WITH CALCIFICATIONS. - SEE COMMENT. 3. Lymph node, needle/core biopsy, left axilla - METASTATIC CARCINOMA IN 1 OF 1 LYMPH NODE (1/1), WITH EXTRACAPSULAR EXTENSION. Microscopic Comment 1. , 2. The carcinoma in parts 1 and 2 is morphologically similar and appears Grade I-II. A prognostic profile will only be performed on part 1 unless otherwise requested. The results were called to [facility] on 10/29/2013. (JBK:ecj 10/29/2013) Pecola Leisure MD Pathologist, Electronic Signature (Case signed 10/29/2013) Specimen Gross and Clinical Information Specimen(s) Obtained: 1. Breast, left, needle core biopsy, @ 12:00; 2 cm from nipple 2. Breast, left, needle core biopsy, 6:00 2 cm from nipple 3. Lymph node, needle/core biopsy, left axilla Specimen Clinical Information 1. mass 2. mass; ? IMC 3. left breast mass and abnormal left axilla  ASSESSMENT    72 year old female with new diagnosis of stage II (T2 N1) invasive ductal carcinoma of the left breast found on self breast examination area patient has multifocal disease. Because of this she has elected to proceed with a mastectomy and axillary  lymph node dissection. This will be performed by Dr. [name]. Patient does understand that. Patient understands that she may receive chemotherapy as well as antiestrogen therapy as well radiation therapy.  Patient and I discussed role of adjuvant therapy for prevention of breast cancer recurrence distally. She would be a good candidate for antiestrogen therapy as her tumor be estrogen receptor positive. I do think that she will need chemotherapy since she does have no positive breast cancer. We will discuss this further after her final surgery and final pathology are available.  Clinical Trial Eligibility:no Multidisciplinary conference discussion yes     PLAN:    #1 proceed with mastectomy and axillary lymph node dissection.  #2 I will plan on seeing the patient back after her surgery to discuss adjuvant therapy       Discussion: Patient is being treated per NCCN breast cancer care guidelines appropriate for stage.II   Thank you so much for allowing me to participate in the care of Wendy [name]. I will continue to follow up the patient with you and assist in her care.  All questions were answered. The patient knows to call the clinic with any problems, questions or  concerns. We can certainly see the patient much sooner if necessary.  I spent 30 minutes counseling the patient face to face. The total time spent in the appointment was 60 minutes.  Drue Second, MD Medical/Oncology [facility] [phone] (beeper) [phone] (Office)  11/03/2013, 5:19 PM

## 2013-11-04 ENCOUNTER — Encounter (INDEPENDENT_AMBULATORY_CARE_PROVIDER_SITE_OTHER): Payer: Self-pay | Admitting: General Surgery

## 2013-11-04 ENCOUNTER — Ambulatory Visit (HOSPITAL_BASED_OUTPATIENT_CLINIC_OR_DEPARTMENT_OTHER): Payer: 59 | Admitting: General Surgery

## 2013-11-04 DIAGNOSIS — C50419 Malignant neoplasm of upper-outer quadrant of unspecified female breast: Secondary | ICD-10-CM

## 2013-11-04 NOTE — Progress Notes (Signed)
Patient ID: Wendy [name], female   DOB: 06/30/1941, 72 y.o.   MRN: [id number]  Chief Complaint  Patient presents with  . Other    HPI Wendy [name] is a 72 y.o. female.  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  She palpated a left breast mass the size of a pea near areola recently that led her to undergo evaluation with mammography.  She has no nipple discharge presents or any other symptoms. She has undergone a mm that shows 2 masses in the left breast. Her density category is c.  She underwent u/s as well and there are two separate masses measuring over 2 cm at 6 oclock and 12 oclock.  There are also abnormal lymph nodes present. She has undergone three biopsies at this point.  One of node and of each breast mass.  The node is positive for metastatic carcinoma.  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  She comes in today to discuss her options. She is being seen with Dr Khan and Dr Wentworth today.  Past Medical History  Diagnosis Date  . Hypertension   . CAD (coronary artery disease)     Past Surgical History  Procedure Laterality Date  . Percutaneous coronary stent intervention (pci-s)    . Abdominal hysterectomy      History reviewed. No pertinent family history.  Social History History  Substance Use Topics  . Smoking status: Former Smoker  . Smokeless tobacco: Never Used  . Alcohol Use: No    No Known Allergies  Medications reviewed, is on aspirin  Review of Systems Review of Systems  Constitutional: Negative for fever, chills and unexpected weight change.  HENT: Negative for congestion, hearing loss, sore throat, trouble swallowing and voice change.   Eyes: Negative for visual disturbance.  Respiratory: Positive for cough. Negative for wheezing.   Cardiovascular: Negative for chest pain, palpitations and leg swelling.    Gastrointestinal: Negative for nausea, vomiting, abdominal pain, diarrhea, constipation, blood in stool, abdominal distention and anal bleeding.  Genitourinary: Negative for hematuria, vaginal bleeding and difficulty urinating.  Musculoskeletal: Positive for back pain. Negative for arthralgias.  Skin: Negative for rash and wound.  Neurological: Negative for seizures, syncope and headaches.  Hematological: Negative for adenopathy. Bruises/bleeds easily.  Psychiatric/Behavioral: Negative for confusion.    There were no vitals taken for this visit.  Physical Exam Physical Exam  Vitals reviewed. Constitutional: She appears well-developed and well-nourished.  Eyes: No scleral icterus.  Neck: Neck supple.  Cardiovascular: Normal rate, regular rhythm and normal heart sounds.   Pulmonary/Chest: Effort normal and breath sounds normal. She has no wheezes. She has no rales. 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 Right breast exhibits no mass, no nipple discharge, no skin change and no tenderness. Left breast exhibits inverted nipple (over 20 years) and mass. Left breast exhibits no nipple discharge, no skin change and no tenderness.    Abdominal: Soft.  Lymphadenopathy:    She has no cervical adenopathy.    She has axillary adenopathy.       Right axillary: No pectoral and no lateral adenopathy present.       Left axillary: Lateral adenopathy present. No pectoral adenopathy present.      Right: No supraclavicular adenopathy present.       Left: No supraclavicular adenopathy present.      Data REXAM:  DIGITAL DIAGNOSTIC BILATERAL MAMMOGRAM WITH CAD  ULTRASOUND LEFT BREAST  COMPARISON: 08/08/2000  ACR Breast Density Category c: The breast tissue is heterogeneously  dense, which may obscure small masses.  FINDINGS:  A spiculated mass is identified at 6 o'clock in left breast cyst,  measuring up to 3 cm. There is associated architectural distortion  and nipple inversion.  Additionally, the patient has bilateral  circumscribed masses, which are similar to the prior examination.  Mammographic images were processed with CAD.  On physical exam,both nipples are inverted, which the patient states  she has had for over 30 years. Firm tissue is palpated at the 6  o'clock, retroareolar, and 12 o'clock positions in the left breast.  Targeted ultrasound demonstrates an irregular hypoechoic mass at 6  o'clock, 2 cm from the nipple. This measures 2.6 x 2.1 x 1.5 cm. At  12 o'clock, 2 cm from the nipple, there is an ill-defined mixed  echogenicity mass measuring 2.1 x 1.8 x 2.1 cm. Ultrasound  examination of the left axilla demonstrates 2 morphologically  abnormal lymph nodes, without fatty hila. The larger node measures  up to 1.2 cm in short axis.  Following ultrasound guided biopsy of the left breast, the wing  shaped clip at 12 o'clock an the ribbon shaped clip at 6 o'clock are  in the expected locations.  IMPRESSION:  1. Suspicious left breast mass at 6:00.  2. Ill-defined mixed echogenicity mass in the left breast at 12  o'clock.  3. Abnormal left axillary lymph nodes.  4. Post biopsy clip images attached to this examination show the  clips to be in the anticipated locations at 12:00 and 6:00 in the  left breast. eviewed   Assessment    Stage 2 left breast cancer    Plan   Left MRM    We discussed the staging and pathophysiology of breast cancer. We discussed all of the different options for treatment for breast cancer including surgery, chemotherapy, radiation therapy, Herceptin, and antiestrogen therapy. We discussed all of these would be options pending surgery.  We discussed primary systemic therapy but decided against that as I don't think it will change her eventual therapy. Furthermore even with positive node she may not need chemotherapy.  We discussed her positive node and the studies for nodes currently underway.  I dont think she need primary  chemotherapy and the standard still for palpable nodes is an axillary dissection which I recommended to her today.  I discussed procedure and risks including chronic shoulder pain, numbness, risk of lymphedema which is up to 30-50%. I will have her see a pt/lymphedema specialist postop also.   We discussed the options for treatment of the breast cancer which included lumpectomy versus a mastectomy. We discussed the performance of the lumpectomy with a wire placement. I don't think she is good candidate for this given two areas.  I don't think she is also good candidate based on exam today or her breast size.  I think if enrolled in double lumpectomy study this would give a poor cosmetic result . We discussed the mastectomy and the postoperative care for that as well. I dont' think she is candidate for NSM.  Would be standard with removal of nipple and areola.  I also offered her to see plastic surgery but she declined.  I will plan on scheduling her for left mrm next week. We discussed the risks of operation including bleeding, infection, possible reoperation. She understands her further therapy will   be based on what her stages at the time of her operation.     Owain Eckerman 11/04/2013, 8:58 PM    

## 2013-11-04 NOTE — Progress Notes (Signed)
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 female  who palpated a left breast mass. She sought medical attention and received a diagnostic mammogram and ultrasound on 10/27/2013. This showed a spiculated mass in the 6:00 position of the left breast measuring 3 cm. Ultrasound confirmed a hypoechoic mass at the 6:00 position measuring 2.6 x 2.1 x 1.5 cm. A second mass was noted at the 12:00 position measuring 2.1 x 1.8 x 2.1 cm. Ultrasound of the left axilla showed 2 abnormal lymph nodes the larger measuring 1.2 cm. A biopsy was performed of the 12 and 6:00 masses as well as the lymph node. All were positive for invasive ductal carcinoma. The tumor was ER/PR positive at 100% HER-2 negative with a Ki-67 of 14%. The lymph node biopsy did show extracapsular extension. She was referred to see myself, Dr. Dwain Sarna, and Dr. Welton Flakes today. She has some soreness and bruising after her biopsy. She has no personal history of breast cancer. She is accompanied by family member today. She had menses at 11 and used hormone replacement for 6 months in 1982.   PREVIOUS RADIATION THERAPY: No  PAST MEDICAL HISTORY:  has a past medical history of Hypertension.    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 She is also status post stent in 2005 a hysterectomy in 1982. A arthroscopic knee surgery in 2004 and bilateral tubal ligation in 1967.  FAMILY HISTORY: She has a family history of a sister at 82 with colon cancer and a half brother with prostate cancer.  SOCIAL HISTORY: She works at a [employer] at [employer] of [place]. She is divorced. She smoked a half a pack a day but  quit 8 years ago. History  Substance Use Topics  . Smoking status: Never Smoker   . Smokeless tobacco: Never Used  . Alcohol Use: No    ALLERGIES: Review of patient's allergies indicates no known allergies.  MEDICATIONS:  No current outpatient prescriptions on file.   No current facility-administered medications for this encounter.    REVIEW OF SYSTEMS:  A 15 point review of systems is documented in the electronic medical record. This was obtained by the nursing staff. However, I reviewed this with the patient to discuss relevant findings and make appropriate changes.  Pertinent items are noted in HPI. she wears glasses she has 2 pillow orthopnea as well as a dry cough, heartburn, easy bruising, back pain, forgetfulness and bilateral chronically inverted nipples.  PHYSICAL EXAM: There were no vitals filed for this visit.. . She is a pleasant female in no distress sitting comfortably on examining room table. She has palpable masses in the superior to inferior aspect of the left breast associated with bruising over the most of the left breast. She has a palpable left axillary lymph node which is large and likely complicated by hematoma. She has no palpable abnormalities of the right breast. No palpable right axillary adenopathy.  LABORATORY DATA:  Lab Results  Component Value Date   WBC 5.5 11/03/2013   HGB 13.0 11/03/2013   HCT 38.3 11/03/2013   MCV 91.0 11/03/2013   PLT 211 11/03/2013   Lab Results  Component Value Date  NA 142 11/03/2013   K 4.1 11/03/2013   CL 108 05/01/2010   CO2 27 11/03/2013   Lab Results  Component Value Date   ALT 45 11/03/2013   AST 38* 11/03/2013   ALKPHOS 59 11/03/2013   BILITOT 0.77 11/03/2013     RADIOGRAPHY: US Breast Left  10/27/2013   CLINICAL DATA:  Patient with left breast lump.  EXAM: DIGITAL DIAGNOSTIC  BILATERAL MAMMOGRAM WITH CAD  ULTRASOUND LEFT BREAST  COMPARISON:  08/08/2000  ACR Breast Density Category c: The breast tissue is  heterogeneously dense, which may obscure small masses.  FINDINGS: A spiculated mass is identified at 6 o'clock in left breast cyst, measuring up to 3 cm. There is associated architectural distortion and nipple inversion. Additionally, the patient has bilateral circumscribed masses, which are similar to the prior examination.  Mammographic images were processed with CAD.  On physical exam,both nipples are inverted, which the patient states she has had for over 30 years. Firm tissue is palpated at the 6 o'clock, retroareolar, and 12 o'clock positions in the left breast.  Targeted ultrasound demonstrates an irregular hypoechoic mass at 6 o'clock, 2 cm from the nipple. This measures 2.6 x 2.1 x 1.5 cm. At 12 o'clock, 2 cm from the nipple, there is an ill-defined mixed echogenicity mass measuring 2.1 x 1.8 x 2.1 cm. Ultrasound examination of the left axilla demonstrates 2 morphologically abnormal lymph nodes, without fatty hila. The larger node measures up to 1.2 cm in short axis.  Following ultrasound guided biopsy of the left breast, the wing shaped clip at 12 o'clock an the ribbon shaped clip at 6 o'clock are in the expected locations.  IMPRESSION: 1. Suspicious left breast mass at 6:00. 2. Ill-defined mixed echogenicity mass in the left breast at 12 o'clock. 3. Abnormal left axillary lymph nodes. 4. Post biopsy clip images attached to this examination show the clips to be in the anticipated locations at 12:00 and 6:00 in the left breast.  RECOMMENDATION: Ultrasound guided core biopsy of the left breast and left axillary lymph node. This will be performed on 10/27/2013.  I have discussed the findings and recommendations with the patient. Results were also provided in writing at the conclusion of the visit.  BI-RADS CATEGORY  5: Highly suggestive of malignancy - appropriate action should be taken.   Electronically Signed   By: Jerene Dilling M.D.   On: 10/27/2013 17:36   [place] Core Biopsy  10/29/2013   ADDENDUM  REPORT: 10/29/2013 13:13  ADDENDUM: Pathology demonstrates the left breast mass at 6 o'clock is invasive ductal carcinoma and DCIS. 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 The biopsied left axillary lymph node is positive for metastatic disease. These results are concordant. Surgical consultation and a breast MRI are recommended. The patient is scheduled to see Dr. Abbey Chatters on 11/02/2013 at 10:30 a.m. The patient's breast MRI will be scheduled. The patient states that she has bruising and soreness of the left breast at the biopsy sites, which has improved.   Electronically Signed   By: Jerene Dilling M.D.   On: 10/29/2013 13:13   10/29/2013   CLINICAL DATA:  Left breast masses and abnormal left axillary lymph nodes.  EXAM: ULTRASOUND GUIDED LEFT BREAST CORE NEEDLE BIOPSY WITH VACUUM ASSIST  COMPARISON:  Previous exams.  PROCEDURE: I met with the patient and we discussed the procedure of ultrasound-guided biopsy, including benefits and alternatives. We discussed the high likelihood of  a successful procedure. We discussed the risks of the procedure including infection, bleeding, tissue injury, clip migration, and inadequate sampling. Informed written consent was given. The usual time-out protocol was performed immediately prior to the procedure.  Using sterile technique and 2% Lidocaine as local anesthetic, under direct ultrasound visualization, a 12 gauge vacuum-assisteddevice was used to perform biopsy of a left breast mass at 6 o'clock, a left breast mass at 12 o'clock, and a left axillary lymph node using lateral to medial approaches. At the conclusion of the procedure, a wing shaped clip was deployed in the biopsy cavity at 12 o'clock and a ribbon shaped clip was deployed in the biopsy cavity at 6 o'clock. The post biopsy mammogram has been attached to the diagnostic bilateral mammogram of the same date.  IMPRESSION:  Ultrasound-guided biopsy of 2 left breast masses and a left axillary lymph node. No apparent complications.  Electronically Signed: By: Jerene Dilling M.D. On: 10/27/2013 17:44   Mm Digital Diagnostic Bilat  10/27/2013   CLINICAL DATA:  Patient with left breast lump.  EXAM: DIGITAL DIAGNOSTIC  BILATERAL MAMMOGRAM WITH CAD  ULTRASOUND LEFT BREAST  COMPARISON:  08/08/2000  ACR Breast Density Category c: The breast tissue is heterogeneously dense, which may obscure small masses.  FINDINGS: A spiculated mass is identified at 6 o'clock in left breast cyst, measuring up to 3 cm. There is associated architectural distortion and nipple inversion. Additionally, the patient has bilateral circumscribed masses, which are similar to the prior examination.  Mammographic images were processed with CAD.  On physical exam,both nipples are inverted, which the patient states she has had for over 30 years. Firm tissue is palpated at the 6 o'clock, retroareolar, and 12 o'clock positions in the left breast.  Targeted ultrasound demonstrates an irregular hypoechoic mass at 6 o'clock, 2 cm from the nipple. This measures 2.6 x 2.1 x 1.5 cm. At 12 o'clock, 2 cm from the nipple, there is an ill-defined mixed echogenicity mass measuring 2.1 x 1.8 x 2.1 cm. Ultrasound examination of the left axilla demonstrates 2 morphologically abnormal lymph nodes, without fatty hila. The larger node measures up to 1.2 cm in short axis.  Following ultrasound guided biopsy of the left breast, the wing shaped clip at 12 o'clock an the ribbon shaped clip at 6 o'clock are in the expected locations.  IMPRESSION: 1. Suspicious left breast mass at 6:00. 2. Ill-defined mixed echogenicity mass in the left breast at 12 o'clock. 3. Abnormal left axillary lymph nodes. 4. Post biopsy clip images attached to this examination show the clips to be in the anticipated locations at 12:00 and 6:00 in the left breast.  RECOMMENDATION: Ultrasound guided core biopsy of the  left breast and left axillary lymph node. This will be performed on 10/27/2013.  I have discussed the findings and recommendations with the patient. Results were also provided in writing at the conclusion of the visit.  BI-RADS CATEGORY  5: Highly suggestive of malignancy - appropriate action should be taken.   Electronically Signed   By: Jerene Dilling M.D.   On: 10/27/2013 17:36   [place] Lt Breast Bx W Loc Dev 1st Lesion Img Bx Spec US Guide  10/29/2013   ADDENDUM REPORT: 10/29/2013 13:13  ADDENDUM: Pathology demonstrates the left breast mass at 6 o'clock is invasive ductal carcinoma and DCIS. 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 The biopsied left axillary lymph node is positive for metastatic disease. These  results are concordant. Surgical consultation and a breast MRI are recommended. The patient is scheduled to see Dr. Abbey Chatters on 11/02/2013 at 10:30 a.m. The patient's breast MRI will be scheduled. The patient states that she has bruising and soreness of the left breast at the biopsy sites, which has improved.   Electronically Signed   By: Jerene Dilling M.D.   On: 10/29/2013 13:13   10/29/2013   CLINICAL DATA:  Left breast masses and abnormal left axillary lymph nodes.  EXAM: ULTRASOUND GUIDED LEFT BREAST CORE NEEDLE BIOPSY WITH VACUUM ASSIST  COMPARISON:  Previous exams.  PROCEDURE: I met with the patient and we discussed the procedure of ultrasound-guided biopsy, including benefits and alternatives. We discussed the high likelihood of a successful procedure. We discussed the risks of the procedure including infection, bleeding, tissue injury, clip migration, and inadequate sampling. Informed written consent was given. The usual time-out protocol was performed immediately prior to the procedure.  Using sterile technique and 2% Lidocaine as local anesthetic, under direct ultrasound visualization, a 12 gauge  vacuum-assisteddevice was used to perform biopsy of a left breast mass at 6 o'clock, a left breast mass at 12 o'clock, and a left axillary lymph node using lateral to medial approaches. At the conclusion of the procedure, a wing shaped clip was deployed in the biopsy cavity at 12 o'clock and a ribbon shaped clip was deployed in the biopsy cavity at 6 o'clock. The post biopsy mammogram has been attached to the diagnostic bilateral mammogram of the same date.  IMPRESSION: Ultrasound-guided biopsy of 2 left breast masses and a left axillary lymph node. No apparent complications.  Electronically Signed: By: Jerene Dilling M.D. On: 10/27/2013 17:44   [place] Lt Breast Bx W Loc Dev Ea Add Lesion Img Bx Spec US Guide  10/29/2013   ADDENDUM REPORT: 10/29/2013 13:13  ADDENDUM: Pathology demonstrates the left breast mass at 6 o'clock is invasive ductal carcinoma and DCIS. 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 The biopsied left axillary lymph node is positive for metastatic disease. These results are concordant. Surgical consultation and a breast MRI are recommended. The patient is scheduled to see Dr. Abbey Chatters on 11/02/2013 at 10:30 a.m. The patient's breast MRI will be scheduled. The patient states that she has bruising and soreness of the left breast at the biopsy sites, which has improved.   Electronically Signed   By: Jerene Dilling M.D.   On: 10/29/2013 13:13   10/29/2013   CLINICAL DATA:  Left breast masses and abnormal left axillary lymph nodes.  EXAM: ULTRASOUND GUIDED LEFT BREAST CORE NEEDLE BIOPSY WITH VACUUM ASSIST  COMPARISON:  Previous exams.  PROCEDURE: I met with the patient and we discussed the procedure of ultrasound-guided biopsy, including benefits and alternatives. We discussed the high likelihood of a successful procedure. We discussed the risks of the procedure including infection, bleeding, tissue injury, clip  migration, and inadequate sampling. Informed written consent was given. The usual time-out protocol was performed immediately prior to the procedure.  Using sterile technique and 2% Lidocaine as local anesthetic, under direct ultrasound visualization, a 12 gauge vacuum-assisteddevice was used to perform biopsy of a left breast mass at 6 o'clock, a left breast mass at 12 o'clock, and a left axillary lymph node using lateral to medial approaches. At the conclusion of the procedure, a wing shaped clip was deployed in the biopsy cavity at 12 o'clock and a ribbon shaped clip was deployed  in the biopsy cavity at 6 o'clock. The post biopsy mammogram has been attached to the diagnostic bilateral mammogram of the same date.  IMPRESSION: Ultrasound-guided biopsy of 2 left breast masses and a left axillary lymph node. No apparent complications.  Electronically Signed: By: Jerene Dilling M.D. On: 10/27/2013 17:44      IMPRESSION: mT2 N1 invasive ductal carcinoma of the left breast  PLAN: After discussion with Dr. Dwain Sarna she has elected to proceed forward with mastectomy and axillary lymph node dissection. She understands she may need chemotherapy following this. We discussed postmastectomy radiation given her lymph node positivity. We discussed the process of simulation the placement tattoos. We discussed 6 weeks of treatment as an outpatient. I will meet with her after surgery and after she has met with Dr. Welton Flakes to discuss systemic treatment. We discussed skin redness and fatigue as possible side effects. We discussed the use of breath hold technique for cardiac sparing.  I spent 30 minutes  face to face with the patient and more than 50% of that time was spent in counseling and/or coordination of care.   ------------------------------------------------  Lurline Hare, MD

## 2013-11-08 ENCOUNTER — Encounter: Payer: Self-pay | Admitting: *Deleted

## 2013-11-08 ENCOUNTER — Encounter (HOSPITAL_COMMUNITY): Payer: Self-pay

## 2013-11-08 ENCOUNTER — Other Ambulatory Visit: Payer: 59

## 2013-11-08 ENCOUNTER — Other Ambulatory Visit (INDEPENDENT_AMBULATORY_CARE_PROVIDER_SITE_OTHER): Payer: Self-pay | Admitting: General Surgery

## 2013-11-08 NOTE — Progress Notes (Signed)
OV with EKG 1/14 Dr Donnie Aho on chart, CBC with diff, CMP 11/03/13 EPIC

## 2013-11-08 NOTE — Progress Notes (Signed)
Faxed Care Plan to Leigh at [facility] & PCP and took a copy to Med Rec to scan.

## 2013-11-08 NOTE — Progress Notes (Signed)
Dr Dwain Sarna-  NEED PRE OP ORDERS PLEASE---has appt PST 11/09/13 [facility]

## 2013-11-08 NOTE — Patient Instructions (Addendum)
20 Wendy [name]  11/08/2013   Your procedure is scheduled on:  11/12/13 FRIDAY  Report to [facility] at 1030      AM.  Call this number if you have problems the morning of surgery: [phone]       Remember:   Do not eat food :After Midnight. Thursday NIGHT-- MAY HAVE CLEAR LIQUIDS Friday MORNING UNTIL 0700 AM-  THEN NOTHING BY MOUTH   Take these medicines the morning of surgery with A SIP OF WATER: NO REGULAR MEDS-- MAY HAVE NITROGLYCERIN IF NEEDED   .  Contacts, dentures or partial plates can not be worn to surgery  Leave suitcase in the car. After surgery it may be brought to your room.  For patients admitted to the hospital, checkout time is 11:00 AM day of  discharge.             SPECIAL INSTRUCTIONS- SEE [facility] PREPARING FOR SURGERY INSTRUCTION SHEET-     DO NOT WEAR JEWELRY, LOTIONS, POWDERS, OR PERFUMES.  WOMEN-- DO NOT SHAVE LEGS OR UNDERARMS FOR 12 HOURS BEFORE SHOWERS. MEN MAY SHAVE FACE.  Patients discharged the day of surgery will not be allowed to drive home. IF going home the day of surgery, you must have a driver and someone to stay with you for the first 24 hours  Name and phone number of your driver:     son                                                                                                   Deliah Goody  [phone]                 FAILURE TO FOLLOW THESE INSTRUCTIONS MAY RESULT IN  CANCELLATION   OF YOUR SURGERY                                                  Patient Signature _____________________________

## 2013-11-09 ENCOUNTER — Encounter: Payer: Self-pay | Admitting: *Deleted

## 2013-11-09 ENCOUNTER — Encounter (HOSPITAL_COMMUNITY): Payer: Self-pay | Admitting: Pharmacy Technician

## 2013-11-09 ENCOUNTER — Ambulatory Visit (HOSPITAL_COMMUNITY)
Admission: RE | Admit: 2013-11-09 | Discharge: 2013-11-09 | Disposition: A | Discharge status: Home / Self Care | Payer: 59 | Source: Ambulatory Visit | Attending: General Surgery | Admitting: General Surgery

## 2013-11-09 ENCOUNTER — Encounter (HOSPITAL_COMMUNITY): Payer: Self-pay

## 2013-11-09 ENCOUNTER — Encounter (HOSPITAL_COMMUNITY)
Admission: RE | Admit: 2013-11-09 | Discharge: 2013-11-09 | Disposition: A | Discharge status: Home / Self Care | Payer: 59 | Source: Ambulatory Visit | Attending: General Surgery | Admitting: General Surgery

## 2013-11-09 DIAGNOSIS — Z01818 Encounter for other preprocedural examination: Secondary | ICD-10-CM | POA: Insufficient documentation

## 2013-11-09 DIAGNOSIS — C50919 Malignant neoplasm of unspecified site of unspecified female breast: Secondary | ICD-10-CM | POA: Insufficient documentation

## 2013-11-09 HISTORY — DX: Peripheral vascular disease, unspecified: I73.9

## 2013-11-09 HISTORY — DX: Hyperlipidemia, unspecified: E78.5

## 2013-11-09 NOTE — Progress Notes (Signed)
Dr Dwain Sarna-   At PST visit patient stated she was not aware of any right axillary nodes being removed during surgery 11/12/13.  Oper ative permit states left mastectomy with removal right axillary  nodes. Please do not think   I am not overstepping by bounds to ask you to clarify this-  I have read your office notes, and dont see any mention of the right nodes.  Her consent will be signed morning of surgery  Thank you much

## 2013-11-09 NOTE — Progress Notes (Signed)
Mailed after appt letter to pt. 

## 2013-11-11 ENCOUNTER — Other Ambulatory Visit (INDEPENDENT_AMBULATORY_CARE_PROVIDER_SITE_OTHER): Payer: Self-pay | Admitting: General Surgery

## 2013-11-11 ENCOUNTER — Telehealth: Payer: Self-pay | Admitting: Oncology

## 2013-11-11 NOTE — Progress Notes (Signed)
Spoke with Elease Hashimoto at Dr Kerry Dory  Office who stated she would clarify the consent order with him today

## 2013-11-12 ENCOUNTER — Encounter (HOSPITAL_COMMUNITY)
Admission: RE | Disposition: A | Discharge status: Home / Self Care | Payer: Self-pay | Source: Ambulatory Visit | Attending: General Surgery

## 2013-11-12 ENCOUNTER — Ambulatory Visit (HOSPITAL_COMMUNITY): Payer: 59 | Admitting: Anesthesiology

## 2013-11-12 ENCOUNTER — Encounter (HOSPITAL_COMMUNITY): Payer: Self-pay | Admitting: *Deleted

## 2013-11-12 ENCOUNTER — Encounter (HOSPITAL_COMMUNITY): Payer: 59 | Admitting: Anesthesiology

## 2013-11-12 ENCOUNTER — Encounter: Payer: Self-pay | Admitting: *Deleted

## 2013-11-12 ENCOUNTER — Observation Stay (HOSPITAL_COMMUNITY)
Admission: RE | Admit: 2013-11-12 | Discharge: 2013-11-13 | Disposition: A | Discharge status: Home / Self Care | Payer: 59 | Source: Ambulatory Visit | Attending: General Surgery | Admitting: General Surgery

## 2013-11-12 DIAGNOSIS — I10 Essential (primary) hypertension: Secondary | ICD-10-CM | POA: Insufficient documentation

## 2013-11-12 DIAGNOSIS — Z87891 Personal history of nicotine dependence: Secondary | ICD-10-CM | POA: Insufficient documentation

## 2013-11-12 DIAGNOSIS — C50919 Malignant neoplasm of unspecified site of unspecified female breast: Principal | ICD-10-CM | POA: Insufficient documentation

## 2013-11-12 DIAGNOSIS — C50412 Malignant neoplasm of upper-outer quadrant of left female breast: Secondary | ICD-10-CM

## 2013-11-12 DIAGNOSIS — R599 Enlarged lymph nodes, unspecified: Secondary | ICD-10-CM | POA: Insufficient documentation

## 2013-11-12 DIAGNOSIS — D0590 Unspecified type of carcinoma in situ of unspecified breast: Secondary | ICD-10-CM

## 2013-11-12 DIAGNOSIS — I2510 Atherosclerotic heart disease of native coronary artery without angina pectoris: Secondary | ICD-10-CM | POA: Insufficient documentation

## 2013-11-12 HISTORY — PX: MASTECTOMY MODIFIED RADICAL: SHX5962

## 2013-11-12 SURGERY — MASTECTOMY, MODIFIED RADICAL
Anesthesia: General | Site: Breast | Laterality: Left

## 2013-11-12 MED ORDER — MIDAZOLAM HCL 2 MG/2ML IJ SOLN
INTRAMUSCULAR | Status: AC
Start: 2013-11-12 — End: 2013-11-12
  Filled 2013-11-12: qty 2

## 2013-11-12 MED ORDER — VALSARTAN-HYDROCHLOROTHIAZIDE 80-12.5 MG PO TABS: 1.0000 | ORAL_TABLET | Freq: Every morning | ORAL | Status: DC

## 2013-11-12 MED ORDER — OXYCODONE HCL 5 MG PO TABS
5.0000 mg | ORAL_TABLET | ORAL | Status: DC | PRN
  Administered 2013-11-13 (×2): 5 mg via ORAL
  Filled 2013-11-12 (×2): qty 1

## 2013-11-12 MED ORDER — PROPOFOL 10 MG/ML IV BOLUS
INTRAVENOUS | Status: AC
  Filled 2013-11-12: qty 20

## 2013-11-12 MED ORDER — MORPHINE SULFATE 2 MG/ML IJ SOLN
2.0000 mg | INTRAMUSCULAR | Status: DC | PRN
  Administered 2013-11-12 (×2): 2 mg via INTRAVENOUS
  Filled 2013-11-12 (×2): qty 1

## 2013-11-12 MED ORDER — PROPOFOL INFUSION 10 MG/ML OPTIME
INTRAVENOUS | Status: DC | PRN
  Administered 2013-11-12: 50 mL via INTRAVENOUS

## 2013-11-12 MED ORDER — HYDROMORPHONE HCL PF 1 MG/ML IJ SOLN
INTRAMUSCULAR | Status: DC | PRN
  Administered 2013-11-12 (×2): 0.5 mg via INTRAVENOUS

## 2013-11-12 MED ORDER — CEFAZOLIN SODIUM 1-5 GM-% IV SOLN
1.0000 g | Freq: Three times a day (TID) | INTRAVENOUS | Status: AC
  Administered 2013-11-12 – 2013-11-13 (×2): 1 g via INTRAVENOUS
  Filled 2013-11-12 (×2): qty 50

## 2013-11-12 MED ORDER — ONDANSETRON HCL 4 MG/2ML IJ SOLN
INTRAMUSCULAR | Status: AC
  Filled 2013-11-12: qty 2

## 2013-11-12 MED ORDER — HYDROMORPHONE HCL PF 2 MG/ML IJ SOLN
INTRAMUSCULAR | Status: AC
  Filled 2013-11-12: qty 1

## 2013-11-12 MED ORDER — KETAMINE HCL 50 MG/ML IJ SOLN
INTRAMUSCULAR | Status: AC
  Filled 2013-11-12: qty 10

## 2013-11-12 MED ORDER — DEXAMETHASONE SODIUM PHOSPHATE 4 MG/ML IJ SOLN
INTRAMUSCULAR | Status: DC | PRN
  Administered 2013-11-12: 10 mg via INTRAVENOUS

## 2013-11-12 MED ORDER — SUCCINYLCHOLINE CHLORIDE 20 MG/ML IJ SOLN
INTRAMUSCULAR | Status: DC | PRN
  Administered 2013-11-12: 100 mg via INTRAVENOUS

## 2013-11-12 MED ORDER — FENTANYL CITRATE 0.05 MG/ML IJ SOLN
INTRAMUSCULAR | Status: DC | PRN
  Administered 2013-11-12: 125 ug via INTRAVENOUS
  Administered 2013-11-12: 25 ug via INTRAVENOUS
  Administered 2013-11-12 (×2): 50 ug via INTRAVENOUS

## 2013-11-12 MED ORDER — KETAMINE HCL 10 MG/ML IJ SOLN
INTRAMUSCULAR | Status: DC | PRN
  Administered 2013-11-12: 25 mg via INTRAVENOUS

## 2013-11-12 MED ORDER — ACETAMINOPHEN 325 MG PO TABS: 650.0000 mg | ORAL_TABLET | Freq: Four times a day (QID) | ORAL | Status: DC | PRN

## 2013-11-12 MED ORDER — LIDOCAINE HCL (CARDIAC) 20 MG/ML IV SOLN
INTRAVENOUS | Status: AC
  Filled 2013-11-12: qty 5

## 2013-11-12 MED ORDER — ONDANSETRON HCL 4 MG/2ML IJ SOLN: 4.0000 mg | Freq: Four times a day (QID) | INTRAMUSCULAR | Status: DC | PRN

## 2013-11-12 MED ORDER — LACTATED RINGERS IV SOLN
INTRAVENOUS | Status: DC | PRN
  Administered 2013-11-12 (×2): via INTRAVENOUS

## 2013-11-12 MED ORDER — SODIUM CHLORIDE 0.9 % IV SOLN
INTRAVENOUS | Status: DC
  Administered 2013-11-13: 02:00:00 via INTRAVENOUS

## 2013-11-12 MED ORDER — HYDROCHLOROTHIAZIDE 12.5 MG PO CAPS
12.5000 mg | ORAL_CAPSULE | Freq: Every day | ORAL | Status: DC
  Administered 2013-11-13: 12.5 mg via ORAL
  Filled 2013-11-12: qty 1

## 2013-11-12 MED ORDER — ONDANSETRON HCL 4 MG/2ML IJ SOLN
INTRAMUSCULAR | Status: DC | PRN
  Administered 2013-11-12 (×2): 2 mg via INTRAVENOUS

## 2013-11-12 MED ORDER — SODIUM CHLORIDE 0.9 % IJ SOLN
INTRAMUSCULAR | Status: AC
  Filled 2013-11-12: qty 10

## 2013-11-12 MED ORDER — LACTATED RINGERS IV SOLN: INTRAVENOUS | Status: DC

## 2013-11-12 MED ORDER — PHENYLEPHRINE 40 MCG/ML (10ML) SYRINGE FOR IV PUSH (FOR BLOOD PRESSURE SUPPORT)
PREFILLED_SYRINGE | INTRAVENOUS | Status: AC
Start: 2013-11-12 — End: 2013-11-12
  Filled 2013-11-12: qty 10

## 2013-11-12 MED ORDER — CEFAZOLIN SODIUM-DEXTROSE 2-3 GM-% IV SOLR
2.0000 g | INTRAVENOUS | Status: AC
  Administered 2013-11-12: 2 g via INTRAVENOUS

## 2013-11-12 MED ORDER — MIDAZOLAM HCL 5 MG/5ML IJ SOLN
INTRAMUSCULAR | Status: DC | PRN
  Administered 2013-11-12: 0.5 mg via INTRAVENOUS

## 2013-11-12 MED ORDER — HYDROMORPHONE HCL PF 1 MG/ML IJ SOLN
INTRAMUSCULAR | Status: AC
  Filled 2013-11-12: qty 1

## 2013-11-12 MED ORDER — SUCCINYLCHOLINE CHLORIDE 20 MG/ML IJ SOLN
INTRAMUSCULAR | Status: AC
  Filled 2013-11-12: qty 1

## 2013-11-12 MED ORDER — CEFAZOLIN SODIUM-DEXTROSE 2-3 GM-% IV SOLR
INTRAVENOUS | Status: AC
  Filled 2013-11-12: qty 50

## 2013-11-12 MED ORDER — EPHEDRINE SULFATE 50 MG/ML IJ SOLN
INTRAMUSCULAR | Status: DC | PRN
  Administered 2013-11-12: 10 mg via INTRAVENOUS
  Administered 2013-11-12: 5 mg via INTRAVENOUS
  Administered 2013-11-12: 10 mg via INTRAVENOUS

## 2013-11-12 MED ORDER — HYDROMORPHONE HCL PF 1 MG/ML IJ SOLN
0.2500 mg | INTRAMUSCULAR | Status: DC | PRN
  Administered 2013-11-12 (×2): 0.5 mg via INTRAVENOUS

## 2013-11-12 MED ORDER — FENTANYL CITRATE 0.05 MG/ML IJ SOLN
INTRAMUSCULAR | Status: AC
  Filled 2013-11-12: qty 5

## 2013-11-12 MED ORDER — PHENYLEPHRINE HCL 10 MG/ML IJ SOLN
INTRAMUSCULAR | Status: DC | PRN
  Administered 2013-11-12: 40 ug via INTRAVENOUS
  Administered 2013-11-12: 20 ug via INTRAVENOUS

## 2013-11-12 MED ORDER — LIDOCAINE HCL (CARDIAC) 20 MG/ML IV SOLN
INTRAVENOUS | Status: DC | PRN
  Administered 2013-11-12: 30 mg via INTRAVENOUS

## 2013-11-12 MED ORDER — IRBESARTAN 75 MG PO TABS
75.0000 mg | ORAL_TABLET | Freq: Every day | ORAL | Status: DC
  Administered 2013-11-13: 75 mg via ORAL
  Filled 2013-11-12: qty 1

## 2013-11-12 MED ORDER — ACETAMINOPHEN 650 MG RE SUPP: 650.0000 mg | Freq: Four times a day (QID) | RECTAL | Status: DC | PRN

## 2013-11-12 SURGICAL SUPPLY — 47 items
APPLIER CLIP 11 MED OPEN (CLIP)
BENZOIN TINCTURE PRP APPL 2/3 (GAUZE/BANDAGES/DRESSINGS) IMPLANT
BINDER BREAST LRG (GAUZE/BANDAGES/DRESSINGS) IMPLANT
BINDER BREAST XLRG (GAUZE/BANDAGES/DRESSINGS) ×2 IMPLANT
BIOPATCH WHT 1IN DISK W/4.0 H (GAUZE/BANDAGES/DRESSINGS) ×4 IMPLANT
BLADE HEX COATED 2.75 (ELECTRODE) ×2 IMPLANT
BNDG COHESIVE 4X5 TAN STRL (GAUZE/BANDAGES/DRESSINGS) IMPLANT
CANISTER SUCTION 2500CC (MISCELLANEOUS) IMPLANT
CHLORAPREP W/TINT 26ML (MISCELLANEOUS) ×2 IMPLANT
CLIP APPLIE 11 MED OPEN (CLIP) IMPLANT
COVER MAYO STAND STRL (DRAPES) IMPLANT
DERMABOND ADVANCED (GAUZE/BANDAGES/DRESSINGS) ×2
DERMABOND ADVANCED .7 DNX12 (GAUZE/BANDAGES/DRESSINGS) ×2 IMPLANT
DRAIN CHANNEL 19F RND (DRAIN) ×4 IMPLANT
DRAPE LAPAROSCOPIC ABDOMINAL (DRAPES) ×2 IMPLANT
DRAPE LG THREE QUARTER DISP (DRAPES) ×2 IMPLANT
DRAPE ORTHO SPLIT 77X108 STRL (DRAPES)
DRAPE SURG ORHT 6 SPLT 77X108 (DRAPES) IMPLANT
DRAPE UTILITY XL STRL (DRAPES) IMPLANT
ELECT BLADE TIP CTD 4 INCH (ELECTRODE) ×2 IMPLANT
ELECT REM PT RETURN 9FT ADLT (ELECTROSURGICAL) ×2
ELECTRODE REM PT RTRN 9FT ADLT (ELECTROSURGICAL) ×1 IMPLANT
EVACUATOR SILICONE 100CC (DRAIN) ×4 IMPLANT
GLOVE BIO SURGEON STRL SZ7 (GLOVE) ×12 IMPLANT
GLOVE BIOGEL PI IND STRL 7.5 (GLOVE) ×1 IMPLANT
GLOVE BIOGEL PI INDICATOR 7.5 (GLOVE) ×1
GOWN PREVENTION PLUS LG XLONG (DISPOSABLE) IMPLANT
GOWN STRL REIN XL XLG (GOWN DISPOSABLE) ×6 IMPLANT
KIT BASIN OR (CUSTOM PROCEDURE TRAY) ×2 IMPLANT
PACK GENERAL/GYN (CUSTOM PROCEDURE TRAY) ×2 IMPLANT
PAD ABD 8X10 STRL (GAUZE/BANDAGES/DRESSINGS) IMPLANT
SPONGE DRAIN TRACH 4X4 STRL 2S (GAUZE/BANDAGES/DRESSINGS) IMPLANT
SPONGE GAUZE 4X4 12PLY (GAUZE/BANDAGES/DRESSINGS) IMPLANT
SPONGE LAP 18X18 X RAY DECT (DISPOSABLE) IMPLANT
STAPLER VISISTAT 35W (STAPLE) ×2 IMPLANT
STOCKINETTE 6  STRL (DRAPES)
STOCKINETTE 6 STRL (DRAPES) IMPLANT
STOCKINETTE 8 INCH (MISCELLANEOUS) IMPLANT
STRIP CLOSURE SKIN 1/2X4 (GAUZE/BANDAGES/DRESSINGS) IMPLANT
SUT ETHILON 2 0 PS N (SUTURE) ×4 IMPLANT
SUT MNCRL AB 4-0 PS2 18 (SUTURE) ×4 IMPLANT
SUT MON AB 5-0 PS2 18 (SUTURE) ×2 IMPLANT
SUT VIC AB 2-0 BRD 54 (SUTURE) ×2 IMPLANT
SUT VIC AB 3-0 SH 8-18 (SUTURE) ×4 IMPLANT
TOWEL OR 17X26 10 PK STRL BLUE (TOWEL DISPOSABLE) ×2 IMPLANT
TOWEL OR NON WOVEN STRL DISP B (DISPOSABLE) ×2 IMPLANT
TRAY FOLEY CATH 14FRSI W/METER (CATHETERS) IMPLANT

## 2013-11-12 NOTE — H&P (View-Only) (Signed)
Patient ID: Wendy [name], female   DOB: July 14, 1941, 72 y.o.   MRN: [id number]  Chief Complaint  Patient presents with  . Other    HPI Wendy [name] is a 72 y.o. female.  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  She palpated a left breast mass the size of a pea near areola recently that led her to undergo evaluation with mammography.  She has no nipple discharge presents or any other symptoms. She has undergone a mm that shows 2 masses in the left breast. Her density category is c.  She underwent u/s as well and there are two separate masses measuring over 2 cm at 6 oclock and 12 oclock.  There are also abnormal lymph nodes present. She has undergone three biopsies at this point.  One of node and of each breast mass.  The node is positive for metastatic carcinoma.  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  She comes in today to discuss her options. She is being seen with Dr Welton Flakes and Dr Michell Heinrich today.  Past Medical History  Diagnosis Date  . Hypertension   . CAD (coronary artery disease)     Past Surgical History  Procedure Laterality Date  . Percutaneous coronary stent intervention (pci-s)    . Abdominal hysterectomy      History reviewed. No pertinent family history.  Social History History  Substance Use Topics  . Smoking status: Former Games developer  . Smokeless tobacco: Never Used  . Alcohol Use: No    No Known Allergies  Medications reviewed, is on aspirin  Review of Systems Review of Systems  Constitutional: Negative for fever, chills and unexpected weight change.  HENT: Negative for congestion, hearing loss, sore throat, trouble swallowing and voice change.   Eyes: Negative for visual disturbance.  Respiratory: Positive for cough. Negative for wheezing.   Cardiovascular: Negative for chest pain, palpitations and leg swelling.    Gastrointestinal: Negative for nausea, vomiting, abdominal pain, diarrhea, constipation, blood in stool, abdominal distention and anal bleeding.  Genitourinary: Negative for hematuria, vaginal bleeding and difficulty urinating.  Musculoskeletal: Positive for back pain. Negative for arthralgias.  Skin: Negative for rash and wound.  Neurological: Negative for seizures, syncope and headaches.  Hematological: Negative for adenopathy. Bruises/bleeds easily.  Psychiatric/Behavioral: Negative for confusion.    There were no vitals taken for this visit.  Physical Exam Physical Exam  Vitals reviewed. Constitutional: She appears well-developed and well-nourished.  Eyes: No scleral icterus.  Neck: Neck supple.  Cardiovascular: Normal rate, regular rhythm and normal heart sounds.   Pulmonary/Chest: Effort normal and breath sounds normal. She has no wheezes. She has no rales. 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 Right breast exhibits no mass, no nipple discharge, no skin change and no tenderness. Left breast exhibits inverted nipple (over 20 years) and mass. Left breast exhibits no nipple discharge, no skin change and no tenderness.    Abdominal: Soft.  Lymphadenopathy:    She has no cervical adenopathy.    She has axillary adenopathy.       Right axillary: No pectoral and no lateral adenopathy present.       Left axillary: Lateral adenopathy present. No pectoral adenopathy present.      Right: No supraclavicular adenopathy present.       Left: No supraclavicular adenopathy present.  Data REXAM:  DIGITAL DIAGNOSTIC BILATERAL MAMMOGRAM WITH CAD  ULTRASOUND LEFT BREAST  COMPARISON: 08/08/2000  ACR Breast Density Category c: The breast tissue is heterogeneously  dense, which may obscure small masses.  FINDINGS:  A spiculated mass is identified at 6 o'clock in left breast cyst,  measuring up to 3 cm. There is associated architectural distortion  and nipple inversion.  Additionally, the patient has bilateral  circumscribed masses, which are similar to the prior examination.  Mammographic images were processed with CAD.  On physical exam,both nipples are inverted, which the patient states  she has had for over 30 years. Firm tissue is palpated at the 6  o'clock, retroareolar, and 12 o'clock positions in the left breast.  Targeted ultrasound demonstrates an irregular hypoechoic mass at 6  o'clock, 2 cm from the nipple. This measures 2.6 x 2.1 x 1.5 cm. At  12 o'clock, 2 cm from the nipple, there is an ill-defined mixed  echogenicity mass measuring 2.1 x 1.8 x 2.1 cm. Ultrasound  examination of the left axilla demonstrates 2 morphologically  abnormal lymph nodes, without fatty hila. The larger node measures  up to 1.2 cm in short axis.  Following ultrasound guided biopsy of the left breast, the wing  shaped clip at 12 o'clock an the ribbon shaped clip at 6 o'clock are  in the expected locations.  IMPRESSION:  1. Suspicious left breast mass at 6:00.  2. Ill-defined mixed echogenicity mass in the left breast at 12  o'clock.  3. Abnormal left axillary lymph nodes.  4. Post biopsy clip images attached to this examination show the  clips to be in the anticipated locations at 12:00 and 6:00 in the  left breast. eviewed   Assessment    Stage 2 left breast cancer    Plan   Left MRM    We discussed the staging and pathophysiology of breast cancer. We discussed all of the different options for treatment for breast cancer including surgery, chemotherapy, radiation therapy, Herceptin, and antiestrogen therapy. We discussed all of these would be options pending surgery.  We discussed primary systemic therapy but decided against that as I don't think it will change her eventual therapy. Furthermore even with positive node she may not need chemotherapy.  We discussed her positive node and the studies for nodes currently underway.  I dont think she need primary  chemotherapy and the standard still for palpable nodes is an axillary dissection which I recommended to her today.  I discussed procedure and risks including chronic shoulder pain, numbness, risk of lymphedema which is up to 30-50%. I will have her see a pt/lymphedema specialist postop also.   We discussed the options for treatment of the breast cancer which included lumpectomy versus a mastectomy. We discussed the performance of the lumpectomy with a wire placement. I don't think she is good candidate for this given two areas.  I don't think she is also good candidate based on exam today or her breast size.  I think if enrolled in double lumpectomy study this would give a poor cosmetic result . We discussed the mastectomy and the postoperative care for that as well. I dont' think she is candidate for NSM.  Would be standard with removal of nipple and areola.  I also offered her to see plastic surgery but she declined.  I will plan on scheduling her for left mrm next week. We discussed the risks of operation including bleeding, infection, possible reoperation. She understands her further therapy will  be based on what her stages at the time of her operation.     Donnivan Villena 11/04/2013, 8:58 PM

## 2013-11-12 NOTE — Anesthesia Postprocedure Evaluation (Signed)
  Anesthesia Post-op Note  Patient: Wendy [name]  Procedure(s) Performed: Procedure(s) (LRB): MASTECTOMY MODIFIED RADICAL (Left)  Patient Location: PACU  Anesthesia Type: General  Level of Consciousness: awake and alert   Airway and Oxygen Therapy: Patient Spontanous Breathing  Post-op Pain: mild  Post-op Assessment: Post-op Vital signs reviewed, Patient's Cardiovascular Status Stable, Respiratory Function Stable, Patent Airway and No signs of Nausea or vomiting  Last Vitals:  Filed Vitals:   11/12/13 1545  BP: 152/77  Pulse: 87  Temp:   Resp: 12    Post-op Vital Signs: stable   Complications: No apparent anesthesia complications

## 2013-11-12 NOTE — Progress Notes (Signed)
Mailed after appt letter to pt. 

## 2013-11-12 NOTE — Interval H&P Note (Signed)
History and Physical Interval Note:  11/12/2013 12:45 PM  Wendy [name]  has presented today for surgery, with the diagnosis of left breast cancer   The various methods of treatment have been discussed with the patient and family. After consideration of risks, benefits and other options for treatment, the patient has consented to  Procedure(s): MASTECTOMY MODIFIED RADICAL (Left) as a surgical intervention .  The patient's history has been reviewed, patient examined, no change in status, stable for surgery.  I have reviewed the patient's chart and labs.  Questions were answered to the patient's satisfaction.     [name]

## 2013-11-12 NOTE — Op Note (Signed)
Preoperative diagnosis: Clinical stage II left breast cancer Postoperative diagnosis: Same as above Procedure: Left modified radical mastectomy Surgeon: Dr. Harden Mo Anesthesia: Gen. 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 She underwent evaluation with mammography and ultrasound that showed 2 distinct left breast masses they were both biopsied and shown to be invasive ductal carcinoma. She also had an axillary lymph node that was biopsied and was ductal carcinoma as well. We discussed all of her options in a multidisciplinary fashion. After discussion about all of the different options she elected to undergo left modified radical mastectomy.I offered her a visit for possible reconstruction which she declined.  Procedure: After informed consent was obtained the patient was taken to the operating room. She was given cefazolin. Sequential compression devices were on her legs. She was then placed under general anesthesia without complication. Her left breast and axilla were then prepped and draped in the standard sterile surgical fashion. A surgical timeout was then performed.  I made an elliptical incision encompassing the nipple areolar complex as well as the tumors that were palpable. I then created flaps superiorly to near the clavicle, medially to the sternum inferiorly to the inframammary crease and laterally to the latissimus. I then removed the breast from the pectoralis muscle including the pectoralis fascia. This was then rolled laterally. She had a large hematoma and multiple palpable lymph nodes in her axilla. I entered  into the axilla and was able to identify her axillary vein. I then swept the contents of the axilla caudad from the axillary vein after identifying the long thoracic nerve and the thoracodorsal bundle. These were not injured. I used a combination of clips as well as ties to divide the attachements. Again there were several large lymph nodes that were present. I then released this laterally. This was then passed off the table as a specimen. I placed several stitches on some small pectoral bleeders. Hemostasis was then obtained. I irrigated. I inserted 2 19 [place] Blake drains secured these with 2-0 nylon suture. I then closed the dermis with multiple 3-0 Vicryl sutures. The skin was closed with 4 Monocryl. Dermabond and Steri-Strips are placed. A breast binder was placed. She tolerated this well was extubated and transferred to recovery stable.

## 2013-11-12 NOTE — Anesthesia Preprocedure Evaluation (Addendum)
Anesthesia Evaluation  Patient identified by MRN, date of birth, ID band Patient awake    Reviewed: Allergy & Precautions, H&P , NPO status , Patient's Chart, lab work & pertinent test results  Airway Mallampati: II TM Distance: >3 FB Neck ROM: full    Dental no notable dental hx. (+) Teeth Intact and Dental Advisory Given   Pulmonary neg pulmonary ROS, former smoker,  breath sounds clear to auscultation  Pulmonary exam normal       Cardiovascular Exercise Tolerance: Good hypertension, Pt. on medications + CAD and + Cardiac Stents Rhythm:regular Rate:Normal     Neuro/Psych negative neurological ROS  negative psych ROS   GI/Hepatic negative GI ROS, Neg liver ROS,   Endo/Other  negative endocrine ROS  Renal/GU negative Renal ROS  negative genitourinary   Musculoskeletal   Abdominal   Peds  Hematology negative hematology ROS (+)   Anesthesia Other Findings Breast cancer  Reproductive/Obstetrics negative OB ROS                         Anesthesia Physical Anesthesia Plan  ASA: III  Anesthesia Plan: General   Post-op Pain Management:    Induction: Intravenous  Airway Management Planned: Oral ETT  Additional Equipment:   Intra-op Plan:   Post-operative Plan: Extubation in OR  Informed Consent: I have reviewed the patients History and Physical, chart, labs and discussed the procedure including the risks, benefits and alternatives for the proposed anesthesia with the patient or authorized representative who has indicated his/her understanding and acceptance.   Dental Advisory Given  Plan Discussed with: CRNA and Surgeon  Anesthesia Plan Comments:         Anesthesia Quick Evaluation

## 2013-11-12 NOTE — Transfer of Care (Signed)
Immediate Anesthesia Transfer of Care Note  Patient: Wendy [name]  Procedure(s) Performed: Procedure(s) (LRB): MASTECTOMY MODIFIED RADICAL (Left)  Patient Location: PACU  Anesthesia Type: General  Level of Consciousness: sedated, patient cooperative and responds to stimulation  Airway & Oxygen Therapy: Patient Spontanous Breathing and Patient connected to face mask oxgen  Post-op Assessment: Report given to PACU RN and Post -op Vital signs reviewed and stable  Post vital signs: Reviewed and stable  Complications: No apparent anesthesia complications

## 2013-11-13 LAB — BASIC METABOLIC PANEL
BUN: 16 mg/dL (ref 6–23)
CO2: 25 mEq/L (ref 19–32)
Calcium: 8.6 mg/dL (ref 8.4–10.5)
Chloride: 101 mEq/L (ref 96–112)
Creatinine, Ser: 0.83 mg/dL (ref 0.50–1.10)
GFR calc Af Amer: 80 mL/min — ABNORMAL LOW (ref >90)
GFR calc non Af Amer: 69 mL/min — ABNORMAL LOW (ref >90)
Glucose, Bld: 137 mg/dL — ABNORMAL HIGH (ref 70–99)
Potassium: 4.4 mEq/L (ref 3.5–5.1)
Sodium: 134 mEq/L — ABNORMAL LOW (ref 135–145)

## 2013-11-13 MED ORDER — OXYCODONE HCL 5 MG PO TABS: 5.0000 mg | ORAL_TABLET | ORAL | Status: DC | PRN

## 2013-11-13 NOTE — Progress Notes (Signed)
Patient ID: Wendy [name], female   DOB: [birth year], 72 y.o.   MRN: [id number] 1 Day Post-Op  Subjective: No complaints. Denies pain or any other concerns.  Objective: Vital signs in last 24 hours: Temp:  [97.3 F (36.3 C)-98.2 F (36.8 C)] 97.6 F (36.4 C) (12/20 0449) Pulse Rate:  [75-90] 83 (12/20 0449) Resp:  [11-24] 18 (12/20 0449) BP: (112-172)/(67-87) 112/67 mmHg (12/20 0449) SpO2:  [97 %-100 %] 98 % (12/20 0449) Weight:  [201 lb (91.173 kg)] 201 lb (91.173 kg) (12/19 1619) Last BM Date: 11/11/13  Intake/Output from previous day: 12/19 0701 - 12/20 0700 In: 1493.8 [I.V.:1393.8; IV Piggyback:100] Out: 185 [Drains:135; Blood:50] Intake/Output this shift:    General appearance: alert, cooperative and no distress Incision/Wound: incision clean and dry without swelling or bruising. JP drainage is minimal and serosanguineous  Lab Results:  No results found for this basename: WBC, HGB, HCT, PLT,  in the last 72 hours BMET No results found for this basename: NA, K, CL, CO2, GLUCOSE, BUN, CREATININE, CALCIUM,  in the last 72 hours   Studies/Results: No results found.  Anti-infectives: Anti-infectives   Start     Dose/Rate Route Frequency Ordered Stop   11/12/13 2200  ceFAZolin (ANCEF) IVPB 1 g/50 mL premix     1 g 100 mL/hr over 30 Minutes Intravenous 3 times per day 11/12/13 1623 11/13/13 0613   11/12/13 1039  ceFAZolin (ANCEF) IVPB 2 g/50 mL premix     2 g 100 mL/hr over 30 Minutes Intravenous On call to O.R. 11/12/13 1039 11/12/13 1245      Assessment/Plan: s/p Procedure(s): MASTECTOMY MODIFIED RADICAL Doing well postoperatively. Ready for discharge.   LOS: 1 day    Jurell Basista T 11/13/2013

## 2013-11-13 NOTE — Progress Notes (Signed)
Utilization Review completed.  

## 2013-11-15 ENCOUNTER — Encounter (HOSPITAL_COMMUNITY): Payer: Self-pay | Admitting: General Surgery

## 2013-11-15 NOTE — Discharge Summary (Signed)
Physician Discharge Summary  Patient ID: Wendy [name] MRN: [id number] DOB/AGE: 1941-02-26 72 y.o.  Admit date: 11/12/2013 Discharge date: 11/13/2013  Admission Diagnoses: Clinical stage II left breast cancer Discharge Diagnoses:  Active Problems:   Breast cancer   Discharged Condition: good  Hospital Course: 61 yof underwent left modified radical mastectomy which was uneventful.  She was discharged home following day doing well  Consults: none  Significant Diagnostic Studies: none  Treatments: surgery: left mrm    Disposition: 01-Home or Self Care  Discharge Orders   Future Appointments Provider Department Dept Phone   11/22/2013 11:45 AM [name], MD [facility], [place] [phone]   12/06/2013 1:30 PM [name], MD [facility] [phone]   Future Orders Complete By Expires   Discharge patient  As directed    Comments:     Please instruct the patient and family in JP care prior to discharge       Medication List         aspirin EC 81 MG tablet  Take 81 mg by mouth daily.     atorvastatin 40 MG tablet  Commonly known as:  LIPITOR  Take 20 mg by mouth at bedtime.     CALCIUM 600 + D PO  Take 1 tablet by mouth daily.     multivitamin with minerals Tabs tablet  Take 1 tablet by mouth daily.     niacin 500 MG CR tablet  Commonly known as:  NIASPAN  Take 500 mg by mouth every other day. At bedtime     omega-3 acid ethyl esters 1 G capsule  Commonly known as:  LOVAZA  Take 1 g by mouth daily.     oxyCODONE 5 MG immediate release tablet  Commonly known as:  Oxy IR/ROXICODONE  Take 1 tablet (5 mg total) by mouth every 4 (four) hours as needed for moderate pain.     Potassium 99 MG Tabs  Take 1 tablet by mouth daily.     valsartan-hydrochlorothiazide 80-12.5 MG per tablet  Commonly known as:  DIOVAN-HCT  Take 1 tablet by mouth every morning.           Follow-up Information   Follow up  with [facility], [place]. Schedule an appointment as soon as possible for a visit on 11/22/2013.   Specialty:  General Surgery   Contact information:   [address] [place] [address] [phone]       Signed: [name] 11/15/2013, 8:29 PM

## 2013-11-22 ENCOUNTER — Encounter (INDEPENDENT_AMBULATORY_CARE_PROVIDER_SITE_OTHER): Payer: Self-pay | Admitting: General Surgery

## 2013-11-22 ENCOUNTER — Ambulatory Visit (INDEPENDENT_AMBULATORY_CARE_PROVIDER_SITE_OTHER): Payer: 59 | Admitting: General Surgery

## 2013-11-22 VITALS — BP 120/64 | HR 81 | Temp 98.0°F | Resp 18 | Ht 65.0 in | Wt 195.0 lb

## 2013-11-22 DIAGNOSIS — Z09 Encounter for follow-up examination after completed treatment for conditions other than malignant neoplasm: Secondary | ICD-10-CM

## 2013-11-22 NOTE — Progress Notes (Signed)
Subjective:     Patient ID: Wendy [name], female   DOB: [birth year], 72 y.o.   MRN: [id number]  HPI 72 year old female status post a left modified radical mastectomy. It was thought that she had 2 separate tumors that ends up on her final pathology she has a single 9 cm tumor with 5 of 10 nodes are positive for metastatic carcinoma. This is ER PR positive at 100%. This is HER-2/neu nonamplified. Her proliferation markers 14%. She's doing well and comes in today without any real complaints. She has over 30 cc per day out of drain #2 but drain #1 as been less than 30 for several days.  Review of Systems     Objective:   Physical Exam Incision is clean without evidence of infection, flaps are viable, drains both have some serous fluid present    Assessment:     Stage III left breast cancer status post left modified radical mastectomy     Plan:     We discussed her pathology today. I do think she will end up needing chemotherapy now. I will wait for her to see medical oncology before we discussed placing a port. I removed one of her drains. I think it should be ready by next week and I will see her back in one week.

## 2013-11-23 ENCOUNTER — Encounter: Payer: Self-pay | Admitting: *Deleted

## 2013-11-23 ENCOUNTER — Other Ambulatory Visit (INDEPENDENT_AMBULATORY_CARE_PROVIDER_SITE_OTHER): Payer: Self-pay | Admitting: *Deleted

## 2013-11-23 MED ORDER — UNABLE TO FIND: Status: DC

## 2013-11-23 NOTE — Progress Notes (Signed)
Per Dawn - No Oncotype Dx is needed to be ordered.

## 2013-11-29 ENCOUNTER — Encounter (INDEPENDENT_AMBULATORY_CARE_PROVIDER_SITE_OTHER): Payer: Self-pay | Admitting: General Surgery

## 2013-11-29 ENCOUNTER — Ambulatory Visit (INDEPENDENT_AMBULATORY_CARE_PROVIDER_SITE_OTHER): Payer: 59 | Admitting: General Surgery

## 2013-11-29 VITALS — BP 160/94 | HR 72 | Temp 98.7°F | Resp 14 | Ht 68.0 in | Wt 199.4 lb

## 2013-11-29 DIAGNOSIS — C50919 Malignant neoplasm of unspecified site of unspecified female breast: Secondary | ICD-10-CM

## 2013-11-29 DIAGNOSIS — Z09 Encounter for follow-up examination after completed treatment for conditions other than malignant neoplasm: Secondary | ICD-10-CM

## 2013-11-29 DIAGNOSIS — C50912 Malignant neoplasm of unspecified site of left female breast: Secondary | ICD-10-CM

## 2013-11-29 NOTE — Progress Notes (Signed)
Subjective:     Patient ID: Wendy [name], female   DOB: Nov 01, 1941, 73 y.o.   MRN: [id number]  HPI 73 year old female status post a left modified radical mastectomy. It was thought that she had 2 separate tumors that ends up on her final pathology she has a single 9 cm tumor with 5 of 10 nodes are positive for metastatic carcinoma. This is ER and PR positive at 100%. This is HER-2/neu nonamplified. Her proliferation marker is 14%. I have removed one drain. She returns today doing well. Her other drain has been putting out less than 30 for several days.   Review of Systems     Objective:   Physical Exam Left mastectomy incision healing well. No infection. There is some swelling and lateral portion it should go down some time. Drain is just some serous fluid.    Assessment:     Status post left MRM for stage III left breast cancer     Plan:     Are removed her remaining drain today. I referred her to physical therapy. I did give her prescription for postmastectomy supplies. We discussed her pathology a little bit again today. I think she is certainly going to need chemotherapy. This is due to the size as well as the nodes. I discussed with her a port today and I will schedule her for port placement based on the day when she is due to see Dr. Humphrey Rolls with medical oncology. I will wait for her to confirm this.

## 2013-11-30 ENCOUNTER — Ambulatory Visit: Payer: 59 | Attending: General Surgery | Admitting: Physical Therapy

## 2013-11-30 DIAGNOSIS — M24519 Contracture, unspecified shoulder: Secondary | ICD-10-CM | POA: Insufficient documentation

## 2013-11-30 DIAGNOSIS — M25519 Pain in unspecified shoulder: Secondary | ICD-10-CM | POA: Insufficient documentation

## 2013-11-30 DIAGNOSIS — I10 Essential (primary) hypertension: Secondary | ICD-10-CM | POA: Insufficient documentation

## 2013-11-30 DIAGNOSIS — IMO0001 Reserved for inherently not codable concepts without codable children: Secondary | ICD-10-CM | POA: Insufficient documentation

## 2013-11-30 DIAGNOSIS — I2510 Atherosclerotic heart disease of native coronary artery without angina pectoris: Secondary | ICD-10-CM | POA: Insufficient documentation

## 2013-11-30 DIAGNOSIS — Z853 Personal history of malignant neoplasm of breast: Secondary | ICD-10-CM | POA: Insufficient documentation

## 2013-12-01 NOTE — Progress Notes (Signed)
Dr. [name] - Please enter preop orders in Epic for [facility].  Thanks.

## 2013-12-02 ENCOUNTER — Encounter: Payer: Medicare Other | Admitting: Physical Therapy

## 2013-12-06 ENCOUNTER — Ambulatory Visit (HOSPITAL_BASED_OUTPATIENT_CLINIC_OR_DEPARTMENT_OTHER): Payer: 59 | Admitting: Oncology

## 2013-12-06 ENCOUNTER — Telehealth: Payer: Self-pay | Admitting: Oncology

## 2013-12-06 ENCOUNTER — Other Ambulatory Visit (INDEPENDENT_AMBULATORY_CARE_PROVIDER_SITE_OTHER): Payer: Self-pay | Admitting: General Surgery

## 2013-12-06 ENCOUNTER — Encounter: Payer: Self-pay | Admitting: Oncology

## 2013-12-06 VITALS — BP 157/82 | HR 85 | Temp 98.3°F | Resp 20 | Ht 68.0 in | Wt 197.6 lb

## 2013-12-06 DIAGNOSIS — Z170 Estrogen receptor positive status [ER+]: Secondary | ICD-10-CM

## 2013-12-06 DIAGNOSIS — C50919 Malignant neoplasm of unspecified site of unspecified female breast: Secondary | ICD-10-CM

## 2013-12-06 DIAGNOSIS — C50412 Malignant neoplasm of upper-outer quadrant of left female breast: Secondary | ICD-10-CM

## 2013-12-06 MED ORDER — LORAZEPAM 0.5 MG PO TABS: 0.5000 mg | ORAL_TABLET | Freq: Four times a day (QID) | ORAL | Status: DC | PRN

## 2013-12-06 MED ORDER — LIDOCAINE-PRILOCAINE 2.5-2.5 % EX CREA: TOPICAL_CREAM | CUTANEOUS | Status: DC | PRN

## 2013-12-06 MED ORDER — ONDANSETRON HCL 8 MG PO TABS: 8.0000 mg | ORAL_TABLET | Freq: Two times a day (BID) | ORAL | Status: DC

## 2013-12-06 MED ORDER — PROCHLORPERAZINE MALEATE 10 MG PO TABS: 10.0000 mg | ORAL_TABLET | Freq: Four times a day (QID) | ORAL | Status: DC | PRN

## 2013-12-06 MED ORDER — DEXAMETHASONE 4 MG PO TABS: 8.0000 mg | ORAL_TABLET | Freq: Two times a day (BID) | ORAL | Status: DC

## 2013-12-07 ENCOUNTER — Telehealth: Payer: Self-pay | Admitting: *Deleted

## 2013-12-07 ENCOUNTER — Ambulatory Visit: Payer: 59

## 2013-12-07 NOTE — Telephone Encounter (Signed)
Per staff message and POF I have scheduled appts.  JMW  

## 2013-12-07 NOTE — Telephone Encounter (Signed)
Per staff message and POF I have tried to schedule appts. Patient's first treatment unable to schedule due to late MD appt, scheduler notified that we need earlier appt. Second treatment scheduled

## 2013-12-08 ENCOUNTER — Telehealth: Payer: Self-pay | Admitting: *Deleted

## 2013-12-08 ENCOUNTER — Encounter (HOSPITAL_COMMUNITY): Payer: Self-pay | Admitting: Pharmacy Technician

## 2013-12-08 ENCOUNTER — Telehealth: Payer: Self-pay | Admitting: Oncology

## 2013-12-08 NOTE — Telephone Encounter (Signed)
Per staff message and POF I have scheduled appts.  JMW  

## 2013-12-09 ENCOUNTER — Ambulatory Visit: Payer: 59 | Admitting: Physical Therapy

## 2013-12-10 ENCOUNTER — Encounter (HOSPITAL_COMMUNITY): Payer: Self-pay

## 2013-12-10 ENCOUNTER — Other Ambulatory Visit: Payer: Self-pay | Admitting: Emergency Medicine

## 2013-12-10 ENCOUNTER — Encounter (HOSPITAL_COMMUNITY)
Admission: RE | Admit: 2013-12-10 | Discharge: 2013-12-10 | Disposition: A | Discharge status: Home / Self Care | Payer: 59 | Source: Ambulatory Visit | Attending: General Surgery | Admitting: General Surgery

## 2013-12-10 DIAGNOSIS — Z01810 Encounter for preprocedural cardiovascular examination: Secondary | ICD-10-CM | POA: Insufficient documentation

## 2013-12-10 DIAGNOSIS — Z01812 Encounter for preprocedural laboratory examination: Secondary | ICD-10-CM | POA: Insufficient documentation

## 2013-12-10 LAB — COMPREHENSIVE METABOLIC PANEL
ALT: 39 U/L — ABNORMAL HIGH (ref 0–35)
AST: 26 U/L (ref 0–37)
Albumin: 3.9 g/dL (ref 3.5–5.2)
Alkaline Phosphatase: 60 U/L (ref 39–117)
BUN: 23 mg/dL (ref 6–23)
CO2: 28 mEq/L (ref 19–32)
Calcium: 9.6 mg/dL (ref 8.4–10.5)
Chloride: 102 mEq/L (ref 96–112)
Creatinine, Ser: 0.99 mg/dL (ref 0.50–1.10)
GFR calc Af Amer: 64 mL/min — ABNORMAL LOW (ref >90)
GFR calc non Af Amer: 56 mL/min — ABNORMAL LOW (ref >90)
Glucose, Bld: 90 mg/dL (ref 70–99)
Potassium: 3.7 mEq/L (ref 3.7–5.3)
Sodium: 143 mEq/L (ref 137–147)
Total Bilirubin: 0.3 mg/dL (ref 0.3–1.2)
Total Protein: 6.6 g/dL (ref 6.0–8.3)

## 2013-12-10 LAB — CBC WITH DIFFERENTIAL/PLATELET
Basophils Absolute: 0 10*3/uL (ref 0.0–0.1)
Basophils Relative: 1 % (ref 0–1)
Eosinophils Absolute: 0.1 10*3/uL (ref 0.0–0.7)
Eosinophils Relative: 3 % (ref 0–5)
HCT: 39.7 % (ref 36.0–46.0)
Hemoglobin: 13.5 g/dL (ref 12.0–15.0)
Lymphocytes Relative: 35 % (ref 12–46)
Lymphs Abs: 1.6 10*3/uL (ref 0.7–4.0)
MCH: 31 pg (ref 26.0–34.0)
MCHC: 34 g/dL (ref 30.0–36.0)
MCV: 91.1 fL (ref 78.0–100.0)
Monocytes Absolute: 0.6 10*3/uL (ref 0.1–1.0)
Monocytes Relative: 14 % — ABNORMAL HIGH (ref 3–12)
Neutro Abs: 2.1 10*3/uL (ref 1.7–7.7)
Neutrophils Relative %: 47 % (ref 43–77)
Platelets: 191 10*3/uL (ref 150–400)
RBC: 4.36 MIL/uL (ref 3.87–5.11)
RDW: 13.6 % (ref 11.5–15.5)
WBC: 4.5 10*3/uL (ref 4.0–10.5)

## 2013-12-10 NOTE — Pre-Procedure Instructions (Addendum)
12-10-13 1130 Spoke with "Ailene Ravel" -Dr. Laurelyn Sickle RN- to coordinate pre-Chemo labs with pre surgical labs. Pt. Will need repeat CBC/d AM of 12-16-13 on arrival to Short Stay. Pt. Has appointment with Dr. Humphrey Rolls 12-16-13 1400. Pt. Aware. EKG done today.

## 2013-12-10 NOTE — Patient Instructions (Signed)
[place]  12/10/2013   Your procedure is scheduled on:  1-22 -2015  Report to [facility] at    [place]    AM .  Call this number if you have problems the morning of surgery: [phone]  Or Presurgical Testing [phone](Lyza Houseworth) For Living Will and/or Health Care Power Attorney Forms: please provide copy for your medical record,may bring AM of surgery(Forms should be already notarized -we do not provide this service).      Do not eat food:After Midnight.    Take these medicines the morning of surgery with A SIP OF WATER: none   Do not wear jewelry, make-up or nail polish.  Do not wear lotions, powders, or perfumes. You may wear deodorant.  Do not shave 12 hours prior to first CHG shower(legs and under arms).(face and neck okay.)  Do not bring valuables to the hospital.(Hospital is not responsible for lost valuables).  Contacts, dentures or removable bridgework, body piercing, hair pins may not be worn into surgery.  Leave suitcase in the car. After surgery it may be brought to your room.  For patients admitted to the hospital, checkout time is 11:00 AM the day of discharge.(Restricted visitors-Persons, age 16 or younger - may not visit at this time.)    Patients discharged the day of surgery will not be allowed to drive home. Must have responsible person with you x 24 hours once discharged.  Name and phone number of your driver: Windell Moment -daughter-in-law [phone] cell  Special Instructions: CHG(Chlorhedine 4%-"Hibiclens","Betasept","Aplicare") Shower Use Special Wash: see special instructions.(avoid face and genitals)   Failure to follow these instructions may result in Cancellation of your surgery.   Patient signature_______________________________________________________

## 2013-12-12 ENCOUNTER — Encounter: Payer: Self-pay | Admitting: Oncology

## 2013-12-12 ENCOUNTER — Telehealth: Payer: Self-pay | Admitting: Oncology

## 2013-12-12 NOTE — Progress Notes (Signed)
OFFICE PROGRESS NOTE  CC**  Stephens Shire, MD [address] [name] [address] Dr. Ty Hilts Dr. Thea Silversmith  DIAGNOSIS:  73 year old female with new diagnosis of invasive ductal carcinoma of the left breast found on self examination  Breast cancer of upper-outer quadrant of left female breast   Primary site: Breast   Staging method: AJCC 7th Edition   Clinical: Stage IIB (T2, N1, cM0) signed by Thea Silversmith, MD on 11/04/2013 11:07 AM   Summary: Stage IIB (T2, N1, cM0)  PRIOR THERAPY: 1. palpated a mass in her left breast. Because of this she had a diagnostic mammogram and ultrasound performed on 10/27/2013. This showed a spiculated mass in the 6:00 position measuring approximately 3 cm. Ultrasound confirmed the hypoechoic mass at the 6:00 position measuring 2.6 x 2.1 x 1.5 cm. There was also noted to be a second mass in the 12:00 position measuring 2.1 x 1.8 x 2.1 cm. 2 abnormal lymph nodes were noted in the left axilla largest one measuring 1.2 cm. Patient had a biopsy of the primary breast masses. She also had a biopsy of the lymph node. All 3 biopsies were positive for invasive ductal carcinoma. Tumor was ER 100% PR 100% positive HER-2/neu negative with a proliferation marker Ki-67 low at 14% and low  #2 patient is status post left mastectomy  Performed on 11/12/2013. The final pathology revealed: Breast, modified radical mastectomy , left - INVASIVE DUCTAL CARCINOMA. - DUCTAL CARCINOMA IN SITU. - MARGINS NOT INVOLVED. 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 - METASTATIC CARCINOMA IN 5 OF 10 LYMPH NODES (5/10).  #3 adjuvant chemotherapy consisting of Taxotere Cytoxan every 3 weeks for a total of 6 cycles curative intent beginning 12/17/2013  CURRENT THERAPY:  Taxotere Cytoxan cycle 1 beginning 12/17/2013  INTERVAL HISTORY: Wendy [name] 73 y.o. female returns for followup visit after mastectomy. We went over her final pathology in  detail. She is at high-risk for recurrence. I have recommended adjuvant chemotherapy followed by radiation and then antiestrogen therapy. We discussed side effects of chemotherapy in detail. We discussed rationale. Clinically today patient seems to be doing well she has minimal tenderness. All of her drains are out. She does not have a Port-A-Cath in but this will be done in the next few weeks. She denies any headaches double vision fevers chills night sweats shortness of breath chest pains palpitations no myalgias and arthralgias. Remainder of the 10 point review of systems is negative.  MEDICAL HISTORY: Past Medical History  Diagnosis Date  . Hypertension   . CAD (coronary artery disease)   . Hyperlipidemia   . Peripheral vascular disease     atherosclerosis carotid artery-mild  . History of blood transfusion   . Cancer     left breast    ALLERGIES:  has No Known Allergies.  MEDICATIONS:  Current Outpatient Prescriptions  Medication Sig Dispense Refill  . aspirin EC 81 MG tablet Take 81 mg by mouth daily.      [name] atorvastatin (LIPITOR) 40 MG tablet Take 20 mg by mouth at bedtime.      . Calcium Carb-Cholecalciferol (CALCIUM 600 + D PO) Take 1 tablet by mouth daily.      [name] dexamethasone (DECADRON) 4 MG tablet Take 8 mg by mouth 2 (two) times daily with a meal. Take two times a day the day before Taxotere. Then take two times a day starting the day after chemo for 3 days.      [name]  lidocaine-prilocaine (EMLA) cream Apply topically as needed (port).      . LORazepam (ATIVAN) 0.5 MG tablet Take 0.5 mg by mouth every 6 (six) hours as needed (Nausea or vomiting).      . Multiple Vitamin (MULTIVITAMIN WITH MINERALS) TABS tablet Take 1 tablet by mouth daily.      . niacin (NIASPAN) 500 MG CR tablet Take 500 mg by mouth every other day. At bedtime      . omega-3 acid ethyl esters (LOVAZA) 1 G capsule Take 1 g by mouth daily.      . ondansetron (ZOFRAN) 8 MG tablet Take 8 mg by mouth 2 (two) times  daily. Take two times a day starting the day after chemo for 3 days. Then take two times a day as needed for nausea or vomiting.      . Potassium 99 MG TABS Take 1 tablet by mouth daily.      . prochlorperazine (COMPAZINE) 10 MG tablet Take 10 mg by mouth every 6 (six) hours as needed for nausea or vomiting.      [name] UNABLE TO FIND Rx: L8000- Mastectomy Bra (Quantity: 2) Dx: 174.9; Left Mastectomy      . valsartan-hydrochlorothiazide (DIOVAN-HCT) 80-12.5 MG per tablet Take 1 tablet by mouth every morning.       No current facility-administered medications for this visit.    SURGICAL HISTORY:  Past Surgical History  Procedure Laterality Date  . Percutaneous coronary stent intervention (pci-s)    . Abdominal hysterectomy    . Nephrectomy living donor Left 2000  . Knee arthroscopy      knee  . Mastectomy modified radical Left 11/12/2013    Procedure: MASTECTOMY MODIFIED RADICAL;  Surgeon: Rolm Bookbinder, MD;  Location: [facility];  Service: General;  Laterality: Left;    REVIEW OF SYSTEMS:  Pertinent items are noted in HPI.   HEALTH MAINTENANCE:    PHYSICAL EXAMINATION: Blood pressure 157/82, pulse 85, temperature 98.3 F (36.8 C), temperature source Oral, resp. rate 20, height 5' 8"  (1.727 m), weight 197 lb 9.6 oz (89.631 kg). Body mass index is 30.05 kg/(m^2). ECOG PERFORMANCE STATUS: 0 - Asymptomatic   General appearance: alert, cooperative and appears stated age Lymph nodes: Cervical, supraclavicular, and axillary nodes normal. Resp: clear to auscultation bilaterally Back: symmetric, no curvature. ROM normal. No CVA tenderness. Cardio: regular rate and rhythm GI: soft, non-tender; bowel sounds normal; no masses,  no organomegaly Extremities: extremities normal, atraumatic, no cyanosis or edema Neurologic: Grossly normal   LABORATORY DATA: Lab Results  Component Value Date   WBC 4.5 12/10/2013   HGB 13.5 12/10/2013   HCT 39.7 12/10/2013   MCV 91.1 12/10/2013   PLT 191  12/10/2013      Chemistry      Component Value Date/Time   NA 143 12/10/2013 1150   NA 142 11/03/2013 1426   K 3.7 12/10/2013 1150   K 4.1 11/03/2013 1426   CL 102 12/10/2013 1150   CO2 28 12/10/2013 1150   CO2 27 11/03/2013 1426   BUN 23 12/10/2013 1150   BUN 25.2 11/03/2013 1426   CREATININE 0.99 12/10/2013 1150   CREATININE 1.1 11/03/2013 1426      Component Value Date/Time   CALCIUM 9.6 12/10/2013 1150   CALCIUM 10.4 11/03/2013 1426   ALKPHOS 60 12/10/2013 1150   ALKPHOS 59 11/03/2013 1426   AST 26 12/10/2013 1150   AST 38* 11/03/2013 1426   ALT 39* 12/10/2013 1150   ALT 45 11/03/2013 1426  BILITOT 0.3 12/10/2013 1150   BILITOT 0.77 11/03/2013 1426       RADIOGRAPHIC STUDIES:  No results found.  ASSESSMENT: 73 year old female with  #1 stage II invasive ductal carcinoma node positive status post left mastectomy on 11/12/2013. Tumor was ER positive. Patient is a good candidate for adjuvant chemotherapy. I discussed rationale for this. We discussed treatment with Taxotere Cytoxan given every 3 weeks with day 2 Neulasta for a total of 6 cycles. She understands this is curative intent.  #2 because patient is no positive she would be a candidate for postmastectomy radiation therapy. She will be referred back to Dr. Thea Silversmith once she has had her chemotherapy completed.  #3 patient will need a Port-A-Cath placed as well as chemotherapy class and I will get her set up for this. For the port she will need to see Dr. Rolm Bookbinder. We discussed the port in detail.  #4 patient will need staging scans to define better she has any evidence of distant rate disease. We discussed this. She is agreeable to this   PLAN:   #1 proceed with staging scans.  #2 proceed with Port-A-Cath placement, chemotherapy teaching class.  #3 I will plan on starting her on chemotherapy on 12/17/2013.   All questions were answered. The patient knows to call the clinic with any problems,  questions or concerns. We can certainly see the patient much sooner if necessary.  I spent 40 minutes counseling the patient face to face. The total time spent in the appointment was 59 minutes   Marcy Panning, MD Medical/Oncology [facility] [phone] (beeper) [phone] (Office)

## 2013-12-12 NOTE — Telephone Encounter (Signed)
[facility] scheduling will contact pt re pet scan per 1/18 pof. order in epic. no other orders.

## 2013-12-13 ENCOUNTER — Ambulatory Visit: Payer: 59

## 2013-12-14 ENCOUNTER — Other Ambulatory Visit: Payer: 59

## 2013-12-14 ENCOUNTER — Encounter: Payer: Self-pay | Admitting: *Deleted

## 2013-12-15 ENCOUNTER — Ambulatory Visit: Payer: 59 | Admitting: Physical Therapy

## 2013-12-16 ENCOUNTER — Encounter: Payer: Self-pay | Admitting: Oncology

## 2013-12-16 ENCOUNTER — Ambulatory Visit (HOSPITAL_COMMUNITY): Payer: 59

## 2013-12-16 ENCOUNTER — Other Ambulatory Visit: Payer: Medicare Other

## 2013-12-16 ENCOUNTER — Encounter (HOSPITAL_COMMUNITY): Payer: 59 | Admitting: Certified Registered Nurse Anesthetist

## 2013-12-16 ENCOUNTER — Encounter (HOSPITAL_COMMUNITY)
Admission: RE | Disposition: A | Discharge status: Home / Self Care | Payer: Self-pay | Source: Ambulatory Visit | Attending: General Surgery

## 2013-12-16 ENCOUNTER — Encounter (HOSPITAL_COMMUNITY): Payer: Self-pay

## 2013-12-16 ENCOUNTER — Encounter: Payer: Medicare Other | Admitting: Physical Therapy

## 2013-12-16 ENCOUNTER — Ambulatory Visit (HOSPITAL_COMMUNITY): Payer: 59 | Admitting: Certified Registered Nurse Anesthetist

## 2013-12-16 ENCOUNTER — Ambulatory Visit (HOSPITAL_COMMUNITY)
Admission: RE | Admit: 2013-12-16 | Discharge: 2013-12-16 | Disposition: A | Discharge status: Home / Self Care | Payer: 59 | Source: Ambulatory Visit | Attending: General Surgery | Admitting: General Surgery

## 2013-12-16 ENCOUNTER — Ambulatory Visit (HOSPITAL_BASED_OUTPATIENT_CLINIC_OR_DEPARTMENT_OTHER): Payer: 59 | Admitting: Oncology

## 2013-12-16 VITALS — BP 153/76 | HR 75 | Temp 97.3°F | Resp 18 | Ht 68.0 in | Wt 199.7 lb

## 2013-12-16 DIAGNOSIS — Z79899 Other long term (current) drug therapy: Secondary | ICD-10-CM | POA: Insufficient documentation

## 2013-12-16 DIAGNOSIS — Z7982 Long term (current) use of aspirin: Secondary | ICD-10-CM | POA: Insufficient documentation

## 2013-12-16 DIAGNOSIS — Z170 Estrogen receptor positive status [ER+]: Secondary | ICD-10-CM

## 2013-12-16 DIAGNOSIS — I739 Peripheral vascular disease, unspecified: Secondary | ICD-10-CM | POA: Insufficient documentation

## 2013-12-16 DIAGNOSIS — I10 Essential (primary) hypertension: Secondary | ICD-10-CM | POA: Insufficient documentation

## 2013-12-16 DIAGNOSIS — C773 Secondary and unspecified malignant neoplasm of axilla and upper limb lymph nodes: Secondary | ICD-10-CM

## 2013-12-16 DIAGNOSIS — I2510 Atherosclerotic heart disease of native coronary artery without angina pectoris: Secondary | ICD-10-CM

## 2013-12-16 DIAGNOSIS — C50412 Malignant neoplasm of upper-outer quadrant of left female breast: Secondary | ICD-10-CM

## 2013-12-16 DIAGNOSIS — C50919 Malignant neoplasm of unspecified site of unspecified female breast: Secondary | ICD-10-CM

## 2013-12-16 DIAGNOSIS — Z9010 Acquired absence of unspecified breast and nipple: Secondary | ICD-10-CM | POA: Insufficient documentation

## 2013-12-16 HISTORY — PX: PORTACATH PLACEMENT: SHX2246

## 2013-12-16 LAB — CBC WITH DIFFERENTIAL/PLATELET
Basophils Absolute: 0 10*3/uL (ref 0.0–0.1)
Basophils Relative: 1 % (ref 0–1)
Eosinophils Absolute: 0.2 10*3/uL (ref 0.0–0.7)
Eosinophils Relative: 4 % (ref 0–5)
HCT: 40.8 % (ref 36.0–46.0)
Hemoglobin: 13.1 g/dL (ref 12.0–15.0)
Lymphocytes Relative: 41 % (ref 12–46)
Lymphs Abs: 1.9 10*3/uL (ref 0.7–4.0)
MCH: 29.6 pg (ref 26.0–34.0)
MCHC: 32.1 g/dL (ref 30.0–36.0)
MCV: 92.3 fL (ref 78.0–100.0)
Monocytes Absolute: 0.6 10*3/uL (ref 0.1–1.0)
Monocytes Relative: 13 % — ABNORMAL HIGH (ref 3–12)
Neutro Abs: 1.9 10*3/uL (ref 1.7–7.7)
Neutrophils Relative %: 41 % — ABNORMAL LOW (ref 43–77)
Platelets: 169 10*3/uL (ref 150–400)
RBC: 4.42 MIL/uL (ref 3.87–5.11)
RDW: 13.8 % (ref 11.5–15.5)
WBC: 4.6 10*3/uL (ref 4.0–10.5)

## 2013-12-16 SURGERY — INSERTION, TUNNELED CENTRAL VENOUS DEVICE, WITH PORT
Anesthesia: General | Site: Chest | Laterality: Right

## 2013-12-16 MED ORDER — LACTATED RINGERS IV SOLN
INTRAVENOUS | Status: DC | PRN
  Administered 2013-12-16 (×2): via INTRAVENOUS

## 2013-12-16 MED ORDER — LIDOCAINE HCL (CARDIAC) 20 MG/ML IV SOLN
INTRAVENOUS | Status: DC | PRN
  Administered 2013-12-16: 50 mg via INTRAVENOUS

## 2013-12-16 MED ORDER — MEPERIDINE HCL 50 MG/ML IJ SOLN: 6.2500 mg | INTRAMUSCULAR | Status: DC | PRN

## 2013-12-16 MED ORDER — FENTANYL CITRATE 0.05 MG/ML IJ SOLN: 25.0000 ug | INTRAMUSCULAR | Status: DC | PRN

## 2013-12-16 MED ORDER — FENTANYL CITRATE 0.05 MG/ML IJ SOLN
INTRAMUSCULAR | Status: DC | PRN
  Administered 2013-12-16 (×2): 25 ug via INTRAVENOUS

## 2013-12-16 MED ORDER — HEPARIN SOD (PORK) LOCK FLUSH 100 UNIT/ML IV SOLN
INTRAVENOUS | Status: AC
  Filled 2013-12-16: qty 10

## 2013-12-16 MED ORDER — OXYCODONE-ACETAMINOPHEN 10-325 MG PO TABS: 1.0000 | ORAL_TABLET | Freq: Four times a day (QID) | ORAL | Status: DC | PRN

## 2013-12-16 MED ORDER — PROPOFOL 10 MG/ML IV BOLUS
INTRAVENOUS | Status: AC
  Filled 2013-12-16: qty 20

## 2013-12-16 MED ORDER — FENTANYL CITRATE 0.05 MG/ML IJ SOLN
INTRAMUSCULAR | Status: AC
  Filled 2013-12-16: qty 2

## 2013-12-16 MED ORDER — SODIUM CHLORIDE 0.9 % IR SOLN
Status: DC | PRN
  Administered 2013-12-16: 1

## 2013-12-16 MED ORDER — ONDANSETRON HCL 4 MG/2ML IJ SOLN
INTRAMUSCULAR | Status: DC | PRN
  Administered 2013-12-16: 4 mg via INTRAVENOUS

## 2013-12-16 MED ORDER — BUPIVACAINE HCL (PF) 0.25 % IJ SOLN
INTRAMUSCULAR | Status: DC | PRN
  Administered 2013-12-16: 10 mL

## 2013-12-16 MED ORDER — SODIUM CHLORIDE 0.9 % IR SOLN
Freq: Once | Status: AC
  Administered 2013-12-16: 08:00:00
  Filled 2013-12-16: qty 1.2

## 2013-12-16 MED ORDER — LIDOCAINE HCL (CARDIAC) 20 MG/ML IV SOLN
INTRAVENOUS | Status: AC
  Filled 2013-12-16: qty 5

## 2013-12-16 MED ORDER — PROMETHAZINE HCL 25 MG/ML IJ SOLN: 6.2500 mg | INTRAMUSCULAR | Status: DC | PRN

## 2013-12-16 MED ORDER — CEFAZOLIN SODIUM-DEXTROSE 2-3 GM-% IV SOLR
2.0000 g | INTRAVENOUS | Status: AC
  Administered 2013-12-16: 2 g via INTRAVENOUS

## 2013-12-16 MED ORDER — LIDOCAINE HCL 1 % IJ SOLN
INTRAMUSCULAR | Status: AC
  Filled 2013-12-16: qty 20

## 2013-12-16 MED ORDER — CEFAZOLIN SODIUM-DEXTROSE 2-3 GM-% IV SOLR
INTRAVENOUS | Status: AC
  Filled 2013-12-16: qty 50

## 2013-12-16 MED ORDER — HEPARIN SOD (PORK) LOCK FLUSH 100 UNIT/ML IV SOLN
INTRAVENOUS | Status: DC | PRN
  Administered 2013-12-16: 500 [IU]

## 2013-12-16 MED ORDER — PROPOFOL 10 MG/ML IV BOLUS
INTRAVENOUS | Status: DC | PRN
  Administered 2013-12-16: 150 mg via INTRAVENOUS

## 2013-12-16 SURGICAL SUPPLY — 44 items
BAG DECANTER FOR FLEXI CONT (MISCELLANEOUS) ×2 IMPLANT
BENZOIN TINCTURE PRP APPL 2/3 (GAUZE/BANDAGES/DRESSINGS) IMPLANT
BLADE HEX COATED 2.75 (ELECTRODE) ×2 IMPLANT
BLADE SURG 15 STRL LF DISP TIS (BLADE) ×1 IMPLANT
BLADE SURG 15 STRL SS (BLADE) ×1
BLADE SURG SZ11 CARB STEEL (BLADE) ×2 IMPLANT
CHLORAPREP W/TINT 26ML (MISCELLANEOUS) ×4 IMPLANT
DECANTER SPIKE VIAL GLASS SM (MISCELLANEOUS) ×2 IMPLANT
DERMABOND ADVANCED (GAUZE/BANDAGES/DRESSINGS) ×1
DERMABOND ADVANCED .7 DNX12 (GAUZE/BANDAGES/DRESSINGS) ×1 IMPLANT
DRAPE C-ARM 42X120 X-RAY (DRAPES) ×2 IMPLANT
DRAPE LAPAROSCOPIC ABDOMINAL (DRAPES) ×2 IMPLANT
DRSG TEGADERM 2-3/8X2-3/4 SM (GAUZE/BANDAGES/DRESSINGS) ×2 IMPLANT
DRSG TEGADERM 4X4.75 (GAUZE/BANDAGES/DRESSINGS) ×2 IMPLANT
ELECT REM PT RETURN 9FT ADLT (ELECTROSURGICAL) ×2
ELECTRODE REM PT RTRN 9FT ADLT (ELECTROSURGICAL) ×1 IMPLANT
GAUZE SPONGE 4X4 16PLY XRAY LF (GAUZE/BANDAGES/DRESSINGS) ×2 IMPLANT
GLOVE BIO SURGEON STRL SZ7 (GLOVE) ×2 IMPLANT
GLOVE BIOGEL PI IND STRL 7.0 (GLOVE) IMPLANT
GLOVE BIOGEL PI IND STRL 7.5 (GLOVE) ×1 IMPLANT
GLOVE BIOGEL PI INDICATOR 7.0 (GLOVE)
GLOVE BIOGEL PI INDICATOR 7.5 (GLOVE) ×1
GOWN STRL REUS W/ TWL XL LVL3 (GOWN DISPOSABLE) IMPLANT
GOWN STRL REUS W/TWL LRG LVL3 (GOWN DISPOSABLE) ×4 IMPLANT
GOWN STRL REUS W/TWL XL LVL3 (GOWN DISPOSABLE) IMPLANT
KIT BASIN OR (CUSTOM PROCEDURE TRAY) ×2 IMPLANT
KIT PORT POWER 8FR ISP CVUE (Catheter) ×2 IMPLANT
KIT PORT POWER ISP 8FR (Catheter) IMPLANT
KIT POWER CATH 8FR (Catheter) IMPLANT
NEEDLE HYPO 25X1 1.5 SAFETY (NEEDLE) ×2 IMPLANT
NS IRRIG 1000ML POUR BTL (IV SOLUTION) IMPLANT
PACK BASIC VI WITH GOWN DISP (CUSTOM PROCEDURE TRAY) ×2 IMPLANT
PENCIL BUTTON HOLSTER BLD 10FT (ELECTRODE) ×2 IMPLANT
SPONGE GAUZE 4X4 12PLY (GAUZE/BANDAGES/DRESSINGS) ×2 IMPLANT
STRIP CLOSURE SKIN 1/2X4 (GAUZE/BANDAGES/DRESSINGS) ×2 IMPLANT
SUT MNCRL AB 4-0 PS2 18 (SUTURE) ×2 IMPLANT
SUT PROLENE 2 0 SH DA (SUTURE) ×2 IMPLANT
SUT SILK 2 0 (SUTURE)
SUT SILK 2-0 30XBRD TIE 12 (SUTURE) IMPLANT
SUT VIC AB 3-0 SH 27 (SUTURE) ×1
SUT VIC AB 3-0 SH 27XBRD (SUTURE) ×1 IMPLANT
SYR CONTROL 10ML LL (SYRINGE) ×2 IMPLANT
SYRINGE 10CC LL (SYRINGE) ×2 IMPLANT
TOWEL OR 17X26 10 PK STRL BLUE (TOWEL DISPOSABLE) ×2 IMPLANT

## 2013-12-16 NOTE — Progress Notes (Signed)
Portable Upright Chest X-ray done.

## 2013-12-16 NOTE — Anesthesia Preprocedure Evaluation (Signed)
Anesthesia Evaluation  Patient identified by MRN, date of birth, ID band Patient awake    Reviewed: Allergy & Precautions, H&P , NPO status , Patient's Chart, lab work & pertinent test results  Airway Mallampati: II TM Distance: >3 FB Neck ROM: full    Dental no notable dental hx. (+) Teeth Intact and Dental Advisory Given   Pulmonary neg pulmonary ROS, former smoker,  breath sounds clear to auscultation  Pulmonary exam normal       Cardiovascular Exercise Tolerance: Good hypertension, Pt. on medications + CAD and + Cardiac Stents Rhythm:regular Rate:Normal     Neuro/Psych negative neurological ROS  negative psych ROS   GI/Hepatic negative GI ROS, Neg liver ROS,   Endo/Other  negative endocrine ROS  Renal/GU negative Renal ROS  negative genitourinary   Musculoskeletal   Abdominal   Peds  Hematology negative hematology ROS (+)   Anesthesia Other Findings Breast cancer  Reproductive/Obstetrics negative OB ROS                           Anesthesia Physical  Anesthesia Plan  ASA: III  Anesthesia Plan: General   Post-op Pain Management:    Induction: Intravenous  Airway Management Planned: LMA  Additional Equipment:   Intra-op Plan:   Post-operative Plan: Extubation in OR  Informed Consent: I have reviewed the patients History and Physical, chart, labs and discussed the procedure including the risks, benefits and alternatives for the proposed anesthesia with the patient or authorized representative who has indicated his/her understanding and acceptance.   Dental Advisory Given  Plan Discussed with: CRNA and Surgeon  Anesthesia Plan Comments:         Anesthesia Quick Evaluation

## 2013-12-16 NOTE — Progress Notes (Signed)
OFFICE PROGRESS NOTE  CC**  Stephens Shire, MD [address] [name] [address] Dr. Ty Hilts Dr. Thea Silversmith  DIAGNOSIS:  73 year old female with new diagnosis of invasive ductal carcinoma of the left breast found on self examination  Breast cancer of upper-outer quadrant of left female breast   Primary site: Breast   Staging method: AJCC 7th Edition   Clinical: Stage IIB (T2, N1, cM0) signed by Thea Silversmith, MD on 11/04/2013 11:07 AM   Summary: Stage IIB (T2, N1, cM0)  PRIOR THERAPY: 1. palpated a mass in her left breast. Because of this she had a diagnostic mammogram and ultrasound performed on 10/27/2013. This showed a spiculated mass in the 6:00 position measuring approximately 3 cm. Ultrasound confirmed the hypoechoic mass at the 6:00 position measuring 2.6 x 2.1 x 1.5 cm. There was also noted to be a second mass in the 12:00 position measuring 2.1 x 1.8 x 2.1 cm. 2 abnormal lymph nodes were noted in the left axilla largest one measuring 1.2 cm. Patient had a biopsy of the primary breast masses. She also had a biopsy of the lymph node. All 3 biopsies were positive for invasive ductal carcinoma. Tumor was ER 100% PR 100% positive HER-2/neu negative with a proliferation marker Ki-67 low at 14% and low  #2 patient is status post left mastectomy  Performed on 11/12/2013. The final pathology revealed: Breast, modified radical mastectomy , left - INVASIVE DUCTAL CARCINOMA. - DUCTAL CARCINOMA IN SITU. - MARGINS NOT INVOLVED. 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 - METASTATIC CARCINOMA IN 5 OF 10 LYMPH NODES (5/10).  #3 adjuvant chemotherapy consisting of Taxotere Cytoxan every 3 weeks for a total of 6 cycles curative intent beginning 12/17/2013  CURRENT THERAPY:  Taxotere/ Cytoxan cycle 1DAY1  INTERVAL HISTORY: [name] 73 y.o. female returns for followup visit for cycle 1 day 1 of her chemotherapy. Overall she feels well. She has  minimal tenderness at the surgical site. She has no nausea or vomiting no abdominal pain no diarrhea or constipation no rashes. She did take her Decadron prior to getting Taxotere. She denies any headaches double vision fevers chills night sweats shortness of breath chest pains palpitations no myalgias and arthralgias. Remainder of the 10 point review of systems is negative.  MEDICAL HISTORY: Past Medical History  Diagnosis Date  . Hypertension   . CAD (coronary artery disease)   . Hyperlipidemia   . Peripheral vascular disease     atherosclerosis carotid artery-mild  . History of blood transfusion   . Cancer     left breast    ALLERGIES:  has No Known Allergies.  MEDICATIONS:  No current facility-administered medications for this visit.   Current Outpatient Prescriptions  Medication Sig Dispense Refill  . oxyCODONE-acetaminophen (PERCOCET) 10-325 MG per tablet Take 1 tablet by mouth every 6 (six) hours as needed for pain.  20 tablet  0   Facility-Administered Medications Ordered in Other Visits  Medication Dose Route Frequency Provider Last Rate Last Dose  . fentaNYL (SUBLIMAZE) injection 25-50 mcg  25-50 mcg Intravenous Q5 min PRN Montez Hageman, MD      . meperidine (DEMEROL) injection 6.25 mg  6.25 mg Intravenous Q5 min PRN Montez Hageman, MD      . promethazine (PHENERGAN) injection 6.25 mg  6.25 mg Intravenous Q15 min PRN Montez Hageman, MD        SURGICAL HISTORY:  Past Surgical History  Procedure Laterality Date  . Percutaneous coronary stent  intervention (pci-s)    . Abdominal hysterectomy    . Nephrectomy living donor Left 2000  . Knee arthroscopy      knee  . Mastectomy modified radical Left 11/12/2013    Procedure: MASTECTOMY MODIFIED RADICAL;  Surgeon: Rolm Bookbinder, MD;  Location: [facility];  Service: General;  Laterality: Left;    REVIEW OF SYSTEMS:  Pertinent items are noted in HPI.   HEALTH MAINTENANCE:    PHYSICAL EXAMINATION: Blood pressure  153/76, pulse 75, temperature 97.3 F (36.3 C), temperature source Oral, resp. rate 18, height 5' 8"  (1.727 m), weight 199 lb 11.2 oz (90.583 kg). Body mass index is 30.37 kg/(m^2). ECOG PERFORMANCE STATUS: 0 - Asymptomatic   General appearance: alert, cooperative and appears stated age Lymph nodes: Cervical, supraclavicular, and axillary nodes normal. Resp: clear to auscultation bilaterally Back: symmetric, no curvature. ROM normal. No CVA tenderness. Cardio: regular rate and rhythm GI: soft, non-tender; bowel sounds normal; no masses,  no organomegaly Extremities: extremities normal, atraumatic, no cyanosis or edema Neurologic: Grossly normal   LABORATORY DATA: Lab Results  Component Value Date   WBC 4.6 12/16/2013   HGB 13.1 12/16/2013   HCT 40.8 12/16/2013   MCV 92.3 12/16/2013   PLT 169 12/16/2013      Chemistry      Component Value Date/Time   NA 143 12/10/2013 1150   NA 142 11/03/2013 1426   K 3.7 12/10/2013 1150   K 4.1 11/03/2013 1426   CL 102 12/10/2013 1150   CO2 28 12/10/2013 1150   CO2 27 11/03/2013 1426   BUN 23 12/10/2013 1150   BUN 25.2 11/03/2013 1426   CREATININE 0.99 12/10/2013 1150   CREATININE 1.1 11/03/2013 1426      Component Value Date/Time   CALCIUM 9.6 12/10/2013 1150   CALCIUM 10.4 11/03/2013 1426   ALKPHOS 60 12/10/2013 1150   ALKPHOS 59 11/03/2013 1426   AST 26 12/10/2013 1150   AST 38* 11/03/2013 1426   ALT 39* 12/10/2013 1150   ALT 45 11/03/2013 1426   BILITOT 0.3 12/10/2013 1150   BILITOT 0.77 11/03/2013 1426       RADIOGRAPHIC STUDIES:  No results found.  ASSESSMENT/PLAN: 73 year old female with  #1 stage II invasive ductal carcinoma node positive status post left mastectomy on 11/12/2013. Tumor was ER positive. Patient is a good candidate for adjuvant chemotherapy. I discussed rationale for this. We discussed treatment with Taxotere Cytoxan given every 3 weeks with day 2 Neulasta for a total of 6 cycles. She understands this is curative  intent.  #2 patient will proceed with cycle 1 day 1 of Taxotere Cytoxan. She has taken her dexamethasone premedications as recommended. She has all of her antiemetics as well.  #3 patient has a PET scan scheduled for 12/20/2013. We will discuss the results when she returns in 1 week.  #4 patient will be seen back in one week followup for interim lab and office visit. However she knows to call with any problems questions or concerns in the meantime.  All questions were answered. The patient knows to call the clinic with any problems, questions or concerns. We can certainly see the patient much sooner if necessary.  I spent 20 minutes counseling the patient face to face. The total time spent in the appointment was 95 minutes   Marcy Panning, MD Medical/Oncology [facility] [phone] (beeper) [phone] (Office)

## 2013-12-16 NOTE — Anesthesia Postprocedure Evaluation (Signed)
  Anesthesia Post-op Note  Patient: Wendy [name]  Procedure(s) Performed: Procedure(s) (LRB): INSERTION PORT-A-CATH (Right)  Patient Location: PACU  Anesthesia Type: General  Level of Consciousness: awake and alert   Airway and Oxygen Therapy: Patient Spontanous Breathing  Post-op Pain: mild  Post-op Assessment: Post-op Vital signs reviewed, Patient's Cardiovascular Status Stable, Respiratory Function Stable, Patent Airway and No signs of Nausea or vomiting  Last Vitals:  Filed Vitals:   12/16/13 1027  BP: 151/73  Pulse:   Temp:   Resp: 16    Post-op Vital Signs: stable   Complications: No apparent anesthesia complications

## 2013-12-16 NOTE — Discharge Instructions (Signed)
    PORT-A-CATH: POST OP INSTRUCTIONS  Always review your discharge instruction sheet given to you by the facility where your surgery was performed.   1. A prescription for pain medication may be given to you upon discharge. Take your pain medication as prescribed, if needed. If narcotic pain medicine is not needed, then you make take acetaminophen (Tylenol) or ibuprofen (Advil) as needed.  2. Take your usually prescribed medications unless otherwise directed. 3. If you need a refill on your pain medication, please contact our office. All narcotic pain medicine now requires a paper prescription.  Phoned in and fax refills are no longer allowed by law.  Prescriptions will not be filled after 5 pm or on weekends.  4. You should follow a light diet for the remainder of the day after your procedure. 5. Most patients will experience some mild swelling and/or bruising in the area of the incision. It may take several days to resolve. 6. It is common to experience some constipation if taking pain medication after surgery. Increasing fluid intake and taking a stool softener (such as Colace) will usually help or prevent this problem from occurring. A mild laxative (Milk of Magnesia or Miralax) should be taken according to package directions if there are no bowel movements after 48 hours.  7. Unless discharge instructions indicate otherwise, you may remove your bandages 48 hours after surgery, and you may shower at that time. You may have steri-strips (small white skin tapes) in place directly over the incision.  These strips should be left on the skin for 7-10 days.  If your surgeon used Dermabond (skin glue) on the incision, you may shower in 24 hours.  The glue will flake off over the next 2-3 weeks.  8. If your port is left accessed at the end of surgery (needle left in port), the dressing cannot get wet and should only by changed by a healthcare professional. When the port is no longer accessed (when the  needle has been removed), follow step 7.   9. ACTIVITIES:  Limit activity involving your arms for the next 72 hours. Do no strenuous exercise or activity for 1 week. You may drive when you are no longer taking prescription pain medication, you can comfortably wear a seatbelt, and you can maneuver your car. 10.You may need to see your doctor in the office for a follow-up appointment.  Please       check with your doctor.  11.When you receive a new Port-a-Cath, you will get a product guide and        ID card.  Please keep them in case you need them.  WHEN TO CALL YOUR DOCTOR ([phone]): 1. Fever over 101.0 2. Chills 3. Continued bleeding from incision 4. Increased redness and tenderness at the site 5. Shortness of breath, difficulty breathing   The clinic staff is available to answer your questions during regular business hours. Please don't hesitate to call and ask to speak to one of the nurses or medical assistants for clinical concerns. If you have a medical emergency, go to the nearest emergency room or call 911.  A surgeon from [facility] is always on call at the hospital.     For further information, please visit [email or web address]      

## 2013-12-16 NOTE — Transfer of Care (Signed)
Immediate Anesthesia Transfer of Care Note  Patient: Wendy [name]  Procedure(s) Performed: Procedure(s): INSERTION PORT-A-CATH (Right)  Patient Location: PACU  Anesthesia Type:General  Level of Consciousness: awake and oriented  Airway & Oxygen Therapy: Patient Spontanous Breathing and Patient connected to face mask oxygen  Post-op Assessment: Report given to PACU RN and Post -op Vital signs reviewed and stable  Post vital signs: Reviewed and stable  Complications: No apparent anesthesia complications

## 2013-12-16 NOTE — H&P (Signed)
Wendy [name] is an 73 y.o. female.   Chief Complaint: breast cancer HPI: 47 yof s/p mrm for breast cancer, now needs port for access during chemotherapy.    Past Medical History  Diagnosis Date  . Hypertension   . CAD (coronary artery disease)   . Hyperlipidemia   . Peripheral vascular disease     atherosclerosis carotid artery-mild  . History of blood transfusion   . Cancer     left breast    Past Surgical History  Procedure Laterality Date  . Percutaneous coronary stent intervention (pci-s)    . Abdominal hysterectomy    . Nephrectomy living donor Left 2000  . Knee arthroscopy      knee  . Mastectomy modified radical Left 11/12/2013    Procedure: MASTECTOMY MODIFIED RADICAL;  Surgeon: Rolm Bookbinder, MD;  Location: [facility];  Service: General;  Laterality: Left;    History reviewed. No pertinent family history. Social History:  reports that she quit smoking about 30 years ago. She has never used smokeless tobacco. She reports that she drinks alcohol. She reports that she does not use illicit drugs.  Allergies: No Known Allergies  Medications Prior to Admission  Medication Sig Dispense Refill  . aspirin EC 81 MG tablet Take 81 mg by mouth daily.      [name] atorvastatin (LIPITOR) 40 MG tablet Take 20 mg by mouth at bedtime.      . Calcium Carb-Cholecalciferol (CALCIUM 600 + D PO) Take 1 tablet by mouth daily.      [name] dexamethasone (DECADRON) 4 MG tablet Take 8 mg by mouth 2 (two) times daily with a meal. Take two times a day the day before Taxotere. Then take two times a day starting the day after chemo for 3 days.      [name] lidocaine-prilocaine (EMLA) cream Apply topically as needed (port).      . LORazepam (ATIVAN) 0.5 MG tablet Take 0.5 mg by mouth every 6 (six) hours as needed (Nausea or vomiting).      . Multiple Vitamin (MULTIVITAMIN WITH MINERALS) TABS tablet Take 1 tablet by mouth daily.      . niacin (NIASPAN) 500 MG CR tablet Take 500 mg by mouth every other day. At  bedtime      . omega-3 acid ethyl esters (LOVAZA) 1 G capsule Take 1 g by mouth daily.      . ondansetron (ZOFRAN) 8 MG tablet Take 8 mg by mouth 2 (two) times daily. Take two times a day starting the day after chemo for 3 days. Then take two times a day as needed for nausea or vomiting.      . Potassium 99 MG TABS Take 1 tablet by mouth daily.      . prochlorperazine (COMPAZINE) 10 MG tablet Take 10 mg by mouth every 6 (six) hours as needed for nausea or vomiting.      [name] UNABLE TO FIND Rx: L8000- Mastectomy Bra (Quantity: 2) Dx: 174.9; Left Mastectomy      . valsartan-hydrochlorothiazide (DIOVAN-HCT) 80-12.5 MG per tablet Take 1 tablet by mouth every morning.        Results for orders placed during the hospital encounter of 12/16/13 (from the past 48 hour(s))  CBC WITH DIFFERENTIAL     Status: Abnormal   Collection Time    12/16/13  5:50 AM      Result Value Range   WBC 4.6  4.0 - 10.5 K/uL   RBC 4.42  3.87 - 5.11 MIL/uL  Hemoglobin 13.1  12.0 - 15.0 g/dL   HCT 40.8  36.0 - 46.0 %   MCV 92.3  78.0 - 100.0 fL   MCH 29.6  26.0 - 34.0 pg   MCHC 32.1  30.0 - 36.0 g/dL   RDW 13.8  11.5 - 15.5 %   Platelets 169  150 - 400 K/uL   Neutrophils Relative % 41 (*) 43 - 77 %   Neutro Abs 1.9  1.7 - 7.7 K/uL   Lymphocytes Relative 41  12 - 46 %   Lymphs Abs 1.9  0.7 - 4.0 K/uL   Monocytes Relative 13 (*) 3 - 12 %   Monocytes Absolute 0.6  0.1 - 1.0 K/uL   Eosinophils Relative 4  0 - 5 %   Eosinophils Absolute 0.2  0.0 - 0.7 K/uL   Basophils Relative 1  0 - 1 %   Basophils Absolute 0.0  0.0 - 0.1 K/uL   No results found.  Review of Systems  Constitutional: Negative for fever.  Respiratory: Negative for shortness of breath.   Cardiovascular: Negative for chest pain.    Blood pressure 142/60, pulse 80, temperature 97.4 F (36.3 C), temperature source Oral, resp. rate 18, SpO2 98.00%. Physical Exam  Vitals reviewed. Constitutional: She appears well-developed and well-nourished.   Cardiovascular: Normal rate, regular rhythm and normal heart sounds.   Respiratory: Effort normal and breath sounds normal.  Surgical site without infection, healing well   Lymphadenopathy:    She has no cervical adenopathy.     Assessment/Plan Stage III breast cancer, port placement  [facility] 12/16/2013, 7:27 AM

## 2013-12-16 NOTE — Op Note (Signed)
Preoperative diagnosis: Clinical stage III left breast cancer status post modified radical mastectomy Postoperative diagnosis: Same as above Procedure: Right subclavian power port insertion Surgeon: Dr. Serita Grammes Anesthesia: Gen. Estimated blood loss: Minimal Complications: None Drains: None Specimens: None Sponge needle count was correct at completion Disposition to recovery in stable condition  Indications: This is a 73 year old female is recently undergone a left modified radical mastectomy for stage II breast cancer. She is due to get chemotherapy we discussed port placement.  Procedure: After informed consent was obtained the patient was taken to the operating room. She was given cefazolin. Sequential compression devices were on her legs. She was placed under anesthesia with an LMA. She then was prepped and draped in the standard sterile surgical fashion. A surgical timeout was then performed.  I infiltrated Marcaine throughout her right chest. I then accessed the subclavian vein on the third pass. The wire was placed. This was confirmed to be in correct position by fluoroscopy. I then made an incision and developed a pocket inferior to this. I then tunneled the line between the 2 spots. I then dilated the tract. I then inserted the dilator assembly under fluoroscopic vision. I then removed the wire. The line was placed. The peel-away sheath was removed. I then pulled the line to be back in the distal cava. I then attached the port. I then sutured this into 2 positions with 2-0 Prolene suture. This flushed easily and aspirated blood. I then accessed this and she is due to begin chemotherapy. I flushed this again. I then placed heparin in the port. I then closed with 3-0 Vicryl, 4-0 Monocryl, and Dermabond, and Steri-Strips. Dressing was placed over this. She tolerated this well was extubated and transferred to recovery stable.

## 2013-12-16 NOTE — Progress Notes (Signed)
X-ray  Results noted.

## 2013-12-17 ENCOUNTER — Other Ambulatory Visit: Payer: Medicare Other

## 2013-12-17 ENCOUNTER — Ambulatory Visit (HOSPITAL_BASED_OUTPATIENT_CLINIC_OR_DEPARTMENT_OTHER): Payer: 59

## 2013-12-17 ENCOUNTER — Ambulatory Visit: Payer: Medicare Other | Admitting: Oncology

## 2013-12-17 ENCOUNTER — Encounter (HOSPITAL_COMMUNITY): Payer: Self-pay | Admitting: General Surgery

## 2013-12-17 VITALS — BP 117/58 | HR 75 | Temp 97.8°F | Resp 18

## 2013-12-17 DIAGNOSIS — C50412 Malignant neoplasm of upper-outer quadrant of left female breast: Secondary | ICD-10-CM

## 2013-12-17 DIAGNOSIS — Z5111 Encounter for antineoplastic chemotherapy: Secondary | ICD-10-CM

## 2013-12-17 DIAGNOSIS — C50919 Malignant neoplasm of unspecified site of unspecified female breast: Secondary | ICD-10-CM

## 2013-12-17 MED ORDER — SODIUM CHLORIDE 0.9 % IJ SOLN
10.0000 mL | INTRAMUSCULAR | Status: DC | PRN
  Administered 2013-12-17: 10 mL
  Filled 2013-12-17: qty 10

## 2013-12-17 MED ORDER — SODIUM CHLORIDE 0.9 % IV SOLN
Freq: Once | INTRAVENOUS | Status: AC
  Administered 2013-12-17: 09:00:00 via INTRAVENOUS

## 2013-12-17 MED ORDER — ONDANSETRON 16 MG/50ML IVPB (CHCC)
16.0000 mg | Freq: Once | INTRAVENOUS | Status: AC
  Administered 2013-12-17: 16 mg via INTRAVENOUS

## 2013-12-17 MED ORDER — SODIUM CHLORIDE 0.9 % IV SOLN
75.0000 mg/m2 | Freq: Once | INTRAVENOUS | Status: AC
  Administered 2013-12-17: 160 mg via INTRAVENOUS
  Filled 2013-12-17: qty 16

## 2013-12-17 MED ORDER — DEXAMETHASONE SODIUM PHOSPHATE 20 MG/5ML IJ SOLN
20.0000 mg | Freq: Once | INTRAMUSCULAR | Status: AC
  Administered 2013-12-17: 20 mg via INTRAVENOUS

## 2013-12-17 MED ORDER — HEPARIN SOD (PORK) LOCK FLUSH 100 UNIT/ML IV SOLN
500.0000 [IU] | Freq: Once | INTRAVENOUS | Status: AC | PRN
  Administered 2013-12-17: 500 [IU]
  Filled 2013-12-17: qty 5

## 2013-12-17 MED ORDER — CYCLOPHOSPHAMIDE CHEMO INJECTION 1 GM
600.0000 mg/m2 | Freq: Once | INTRAMUSCULAR | Status: AC
  Administered 2013-12-17: 1240 mg via INTRAVENOUS
  Filled 2013-12-17: qty 62

## 2013-12-17 MED ORDER — ONDANSETRON 16 MG/50ML IVPB (CHCC)
INTRAVENOUS | Status: AC
  Filled 2013-12-17: qty 16

## 2013-12-17 MED ORDER — DEXAMETHASONE SODIUM PHOSPHATE 20 MG/5ML IJ SOLN
INTRAMUSCULAR | Status: AC
  Filled 2013-12-17: qty 5

## 2013-12-17 NOTE — Patient Instructions (Signed)
[facility] Discharge Instructions for Patients Receiving Chemotherapy  Today you received the following chemotherapy agents taxotere and cytoxan.  To help prevent nausea and vomiting after your treatment, we encourage you to take your nausea medication zofran and compazine.   If you develop nausea and vomiting that is not controlled by your nausea medication, call the clinic.   BELOW ARE SYMPTOMS THAT SHOULD BE REPORTED IMMEDIATELY:  *FEVER GREATER THAN 100.5 F  *CHILLS WITH OR WITHOUT FEVER  NAUSEA AND VOMITING THAT IS NOT CONTROLLED WITH YOUR NAUSEA MEDICATION  *UNUSUAL SHORTNESS OF BREATH  *UNUSUAL BRUISING OR BLEEDING  TENDERNESS IN MOUTH AND THROAT WITH OR WITHOUT PRESENCE OF ULCERS  *URINARY PROBLEMS  *BOWEL PROBLEMS  UNUSUAL RASH Items with * indicate a potential emergency and should be followed up as soon as possible.  Feel free to call the clinic you have any questions or concerns. The clinic phone number is [phone].

## 2013-12-18 ENCOUNTER — Ambulatory Visit (HOSPITAL_BASED_OUTPATIENT_CLINIC_OR_DEPARTMENT_OTHER): Payer: 59

## 2013-12-18 VITALS — BP 110/85 | HR 78 | Temp 97.4°F | Resp 18

## 2013-12-18 DIAGNOSIS — C50412 Malignant neoplasm of upper-outer quadrant of left female breast: Secondary | ICD-10-CM

## 2013-12-18 DIAGNOSIS — C50919 Malignant neoplasm of unspecified site of unspecified female breast: Secondary | ICD-10-CM

## 2013-12-18 DIAGNOSIS — Z5189 Encounter for other specified aftercare: Secondary | ICD-10-CM

## 2013-12-18 MED ORDER — PEGFILGRASTIM INJECTION 6 MG/0.6ML
6.0000 mg | Freq: Once | SUBCUTANEOUS | Status: AC
  Administered 2013-12-18: 6 mg via SUBCUTANEOUS

## 2013-12-18 NOTE — Patient Instructions (Signed)

## 2013-12-20 ENCOUNTER — Telehealth: Payer: Self-pay

## 2013-12-20 ENCOUNTER — Ambulatory Visit (HOSPITAL_COMMUNITY)
Admission: RE | Admit: 2013-12-20 | Discharge: 2013-12-20 | Disposition: A | Discharge status: Home / Self Care | Payer: 59 | Source: Ambulatory Visit | Attending: Oncology | Admitting: Oncology

## 2013-12-20 DIAGNOSIS — Z9010 Acquired absence of unspecified breast and nipple: Secondary | ICD-10-CM | POA: Insufficient documentation

## 2013-12-20 DIAGNOSIS — C50412 Malignant neoplasm of upper-outer quadrant of left female breast: Secondary | ICD-10-CM

## 2013-12-20 DIAGNOSIS — R911 Solitary pulmonary nodule: Secondary | ICD-10-CM | POA: Insufficient documentation

## 2013-12-20 DIAGNOSIS — C50919 Malignant neoplasm of unspecified site of unspecified female breast: Secondary | ICD-10-CM | POA: Insufficient documentation

## 2013-12-20 DIAGNOSIS — I700 Atherosclerosis of aorta: Secondary | ICD-10-CM | POA: Insufficient documentation

## 2013-12-20 DIAGNOSIS — Z905 Acquired absence of kidney: Secondary | ICD-10-CM | POA: Insufficient documentation

## 2013-12-20 DIAGNOSIS — I059 Rheumatic mitral valve disease, unspecified: Secondary | ICD-10-CM | POA: Insufficient documentation

## 2013-12-20 DIAGNOSIS — I2510 Atherosclerotic heart disease of native coronary artery without angina pectoris: Secondary | ICD-10-CM | POA: Insufficient documentation

## 2013-12-20 DIAGNOSIS — Z90710 Acquired absence of both cervix and uterus: Secondary | ICD-10-CM | POA: Insufficient documentation

## 2013-12-20 LAB — GLUCOSE, CAPILLARY: Glucose-Capillary: 101 mg/dL — ABNORMAL HIGH (ref 70–99)

## 2013-12-20 MED ORDER — FLUDEOXYGLUCOSE F - 18 (FDG) INJECTION
18.9000 | Freq: Once | INTRAVENOUS | Status: AC | PRN
  Administered 2013-12-20: 18.9 via INTRAVENOUS

## 2013-12-20 NOTE — Telephone Encounter (Signed)
Message copied by Baruch Merl on Mon Dec 20, 2013  3:18 PM ------      Message from: Ignacia Felling      Created: Fri Dec 17, 2013 10:41 AM      Regarding: chemo follow up call       1st taxotere/cytoxan   Dr. Humphrey Rolls   [phone] ------

## 2013-12-20 NOTE — Telephone Encounter (Signed)
Spoke with Wendy [name].  She is doing very well after her treatment Friday. No nausea, vomiting , diarrhea or body aches from neulasta.  She know to call the office at [phone] if she has any issues arise prior to appointment 12-24-13.

## 2013-12-22 NOTE — Progress Notes (Signed)
Quick Note:  Please call patient: PET scan looks good ______

## 2013-12-23 ENCOUNTER — Ambulatory Visit: Payer: 59 | Admitting: Physical Therapy

## 2013-12-24 ENCOUNTER — Encounter: Payer: Self-pay | Admitting: Oncology

## 2013-12-24 ENCOUNTER — Ambulatory Visit (HOSPITAL_BASED_OUTPATIENT_CLINIC_OR_DEPARTMENT_OTHER): Payer: 59 | Admitting: Oncology

## 2013-12-24 ENCOUNTER — Telehealth: Payer: Self-pay | Admitting: *Deleted

## 2013-12-24 ENCOUNTER — Telehealth: Payer: Self-pay | Admitting: Oncology

## 2013-12-24 ENCOUNTER — Other Ambulatory Visit (HOSPITAL_BASED_OUTPATIENT_CLINIC_OR_DEPARTMENT_OTHER): Payer: 59

## 2013-12-24 ENCOUNTER — Ambulatory Visit (HOSPITAL_BASED_OUTPATIENT_CLINIC_OR_DEPARTMENT_OTHER): Payer: 59

## 2013-12-24 VITALS — BP 95/57 | HR 106 | Temp 98.1°F | Resp 18 | Ht 68.0 in | Wt 193.5 lb

## 2013-12-24 DIAGNOSIS — C50919 Malignant neoplasm of unspecified site of unspecified female breast: Secondary | ICD-10-CM

## 2013-12-24 DIAGNOSIS — C50412 Malignant neoplasm of upper-outer quadrant of left female breast: Secondary | ICD-10-CM

## 2013-12-24 DIAGNOSIS — E860 Dehydration: Secondary | ICD-10-CM

## 2013-12-24 DIAGNOSIS — C773 Secondary and unspecified malignant neoplasm of axilla and upper limb lymph nodes: Secondary | ICD-10-CM

## 2013-12-24 DIAGNOSIS — Z170 Estrogen receptor positive status [ER+]: Secondary | ICD-10-CM

## 2013-12-24 LAB — CBC WITH DIFFERENTIAL/PLATELET
BASO%: 0.6 % (ref 0.0–2.0)
Basophils Absolute: 0.1 10*3/uL (ref 0.0–0.1)
EOS%: 0.3 % (ref 0.0–7.0)
Eosinophils Absolute: 0 10*3/uL (ref 0.0–0.5)
HCT: 42 % (ref 34.8–46.6)
HGB: 14 g/dL (ref 11.6–15.9)
LYMPH%: 14.5 % (ref 14.0–49.7)
MCH: 30.2 pg (ref 25.1–34.0)
MCHC: 33.3 g/dL (ref 31.5–36.0)
MCV: 90.7 fL (ref 79.5–101.0)
MONO#: 3.8 10*3/uL — ABNORMAL HIGH (ref 0.1–0.9)
MONO%: 30.3 % — ABNORMAL HIGH (ref 0.0–14.0)
NEUT#: 6.8 10*3/uL — ABNORMAL HIGH (ref 1.5–6.5)
NEUT%: 54.3 % (ref 38.4–76.8)
Platelets: 164 10*3/uL (ref 145–400)
RBC: 4.63 10*6/uL (ref 3.70–5.45)
RDW: 14 % (ref 11.2–14.5)
WBC: 12.5 10*3/uL — ABNORMAL HIGH (ref 3.9–10.3)
lymph#: 1.8 10*3/uL (ref 0.9–3.3)

## 2013-12-24 LAB — COMPREHENSIVE METABOLIC PANEL (CC13)
ALT: 33 U/L (ref 0–55)
AST: 23 U/L (ref 5–34)
Albumin: 3.7 g/dL (ref 3.5–5.0)
Alkaline Phosphatase: 73 U/L (ref 40–150)
Anion Gap: 13 mEq/L — ABNORMAL HIGH (ref 3–11)
BUN: 22.3 mg/dL (ref 7.0–26.0)
CO2: 25 mEq/L (ref 22–29)
Calcium: 10.3 mg/dL (ref 8.4–10.4)
Chloride: 100 mEq/L (ref 98–109)
Creatinine: 1.2 mg/dL — ABNORMAL HIGH (ref 0.6–1.1)
Glucose: 112 mg/dl (ref 70–140)
Potassium: 4.5 mEq/L (ref 3.5–5.1)
Sodium: 138 mEq/L (ref 136–145)
Total Bilirubin: 0.49 mg/dL (ref 0.20–1.20)
Total Protein: 6.4 g/dL (ref 6.4–8.3)

## 2013-12-24 MED ORDER — ONDANSETRON 16 MG/50ML IVPB (CHCC)
INTRAVENOUS | Status: AC
  Filled 2013-12-24: qty 16

## 2013-12-24 MED ORDER — ONDANSETRON 16 MG/50ML IVPB (CHCC)
16.0000 mg | Freq: Once | INTRAVENOUS | Status: AC
  Administered 2013-12-24: 16 mg via INTRAVENOUS

## 2013-12-24 MED ORDER — HEPARIN SOD (PORK) LOCK FLUSH 100 UNIT/ML IV SOLN
500.0000 [IU] | Freq: Once | INTRAVENOUS | Status: AC
  Administered 2013-12-24: 500 [IU] via INTRAVENOUS
  Filled 2013-12-24: qty 5

## 2013-12-24 MED ORDER — SODIUM CHLORIDE 0.9 % IV SOLN
Freq: Once | INTRAVENOUS | Status: AC
  Administered 2013-12-24: 14:00:00 via INTRAVENOUS

## 2013-12-24 MED ORDER — ONDANSETRON 8 MG/NS 50 ML IVPB
INTRAVENOUS | Status: AC
  Filled 2013-12-24: qty 8

## 2013-12-24 MED ORDER — SODIUM CHLORIDE 0.9 % IJ SOLN
10.0000 mL | Freq: Once | INTRAMUSCULAR | Status: AC
  Administered 2013-12-24: 10 mL via INTRAVENOUS
  Filled 2013-12-24: qty 10

## 2013-12-24 NOTE — Telephone Encounter (Signed)
Per staff message and POF I have scheduled appts.  JMW  

## 2013-12-24 NOTE — Patient Instructions (Signed)
Dehydration, Adult Dehydration is when you lose more fluids from the body than you take in. Vital organs like the kidneys, brain, and heart cannot function without a proper amount of fluids and salt. Any loss of fluids from the body can cause dehydration.  CAUSES   Vomiting.  Diarrhea.  Excessive sweating.  Excessive urine output.  Fever. SYMPTOMS  Mild dehydration  Thirst.  Dry lips.  Slightly dry mouth. Moderate dehydration  Very dry mouth.  Sunken eyes.  Skin does not bounce back quickly when lightly pinched and released.  Dark urine and decreased urine production.  Decreased tear production.  Headache. Severe dehydration  Very dry mouth.  Extreme thirst.  Rapid, weak pulse (more than 100 beats per minute at rest).  Cold hands and feet.  Not able to sweat in spite of heat and temperature.  Rapid breathing.  Blue lips.  Confusion and lethargy.  Difficulty being awakened.  Minimal urine production.  No tears. DIAGNOSIS  Your caregiver will diagnose dehydration based on your symptoms and your exam. Blood and urine tests will help confirm the diagnosis. The diagnostic evaluation should also identify the cause of dehydration. TREATMENT  Treatment of mild or moderate dehydration can often be done at home by increasing the amount of fluids that you drink. It is best to drink small amounts of fluid more often. Drinking too much at one time can make vomiting worse. Refer to the home care instructions below. Severe dehydration needs to be treated at the hospital where you will probably be given intravenous (IV) fluids that contain water and electrolytes. HOME CARE INSTRUCTIONS   Ask your caregiver about specific rehydration instructions.  Drink enough fluids to keep your urine clear or pale yellow.  Drink small amounts frequently if you have nausea and vomiting.  Eat as you normally do.  Avoid:  Foods or drinks high in sugar.  Carbonated  drinks.  Juice.  Extremely hot or cold fluids.  Drinks with caffeine.  Fatty, greasy foods.  Alcohol.  Tobacco.  Overeating.  Gelatin desserts.  Wash your hands well to avoid spreading bacteria and viruses.  Only take over-the-counter or prescription medicines for pain, discomfort, or fever as directed by your caregiver.  Ask your caregiver if you should continue all prescribed and over-the-counter medicines.  Keep all follow-up appointments with your caregiver. SEEK MEDICAL CARE IF:  You have abdominal pain and it increases or stays in one area (localizes).  You have a rash, stiff neck, or severe headache.  You are irritable, sleepy, or difficult to awaken.  You are weak, dizzy, or extremely thirsty. SEEK IMMEDIATE MEDICAL CARE IF:   You are unable to keep fluids down or you get worse despite treatment.  You have frequent episodes of vomiting or diarrhea.  You have blood or green matter (bile) in your vomit.  You have blood in your stool or your stool looks black and tarry.  You have not urinated in 6 to 8 hours, or you have only urinated a small amount of very dark urine.  You have a fever.  You faint. MAKE SURE YOU:   Understand these instructions.  Will watch your condition.  Will get help right away if you are not doing well or get worse. Document Released: 11/11/2005 Document Revised: 02/03/2012 Document Reviewed: 07/01/2011 [employer] Patient Information 2014 [employer].  

## 2013-12-24 NOTE — Progress Notes (Signed)
OFFICE PROGRESS NOTE  CC**  Stephens Shire, MD [address] [name] [address] Dr. Ty Hilts Dr. Thea Silversmith  DIAGNOSIS:  73 year old female with new diagnosis of invasive ductal carcinoma of the left breast found on self examination  Breast cancer of upper-outer quadrant of left female breast   Primary site: Breast   Staging method: AJCC 7th Edition   Clinical: Stage IIB (T2, N1, cM0) signed by Thea Silversmith, MD on 11/04/2013 11:07 AM   Summary: Stage IIB (T2, N1, cM0)  PRIOR THERAPY: 1. palpated a mass in her left breast. Because of this she had a diagnostic mammogram and ultrasound performed on 10/27/2013. This showed a spiculated mass in the 6:00 position measuring approximately 3 cm. Ultrasound confirmed the hypoechoic mass at the 6:00 position measuring 2.6 x 2.1 x 1.5 cm. There was also noted to be a second mass in the 12:00 position measuring 2.1 x 1.8 x 2.1 cm. 2 abnormal lymph nodes were noted in the left axilla largest one measuring 1.2 cm. Patient had a biopsy of the primary breast masses. She also had a biopsy of the lymph node. All 3 biopsies were positive for invasive ductal carcinoma. Tumor was ER 100% PR 100% positive HER-2/neu negative with a proliferation marker Ki-67 low at 14% and low  #2 patient is status post left mastectomy  Performed on 11/12/2013. The final pathology revealed: Breast, modified radical mastectomy , left - INVASIVE DUCTAL CARCINOMA. - DUCTAL CARCINOMA IN SITU. - MARGINS NOT INVOLVED. 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 - METASTATIC CARCINOMA IN 5 OF 10 LYMPH NODES (5/10).  #3 adjuvant chemotherapy consisting of Taxotere Cytoxan every 3 weeks for a total of 6 cycles curative intent beginning 12/17/2013  CURRENT THERAPY:  Taxotere Cytoxan cycle 1 DAY8  INTERVAL HISTORY: Wendy [name] 73 y.o. female returns for followup visit  She denies any headaches double vision fevers chills night sweats shortness  of breath chest pains palpitations no myalgias and arthralgias. She is weak tired and has some dizziness. She is somewhat dehydrated. Urine output has slowed down.Remainder of the 10 point review of systems is negative.  MEDICAL HISTORY: Past Medical History  Diagnosis Date  . Hypertension   . CAD (coronary artery disease)   . Hyperlipidemia   . Peripheral vascular disease     atherosclerosis carotid artery-mild  . History of blood transfusion   . Cancer     left breast    ALLERGIES:  has No Known Allergies.  MEDICATIONS:  Current Outpatient Prescriptions  Medication Sig Dispense Refill  . aspirin EC 81 MG tablet Take 81 mg by mouth daily.      [name] atorvastatin (LIPITOR) 40 MG tablet Take 20 mg by mouth at bedtime.      . Calcium Carb-Cholecalciferol (CALCIUM 600 + D PO) Take 1 tablet by mouth daily.      [name] dexamethasone (DECADRON) 4 MG tablet Take 8 mg by mouth 2 (two) times daily with a meal. Take two times a day the day before Taxotere. Then take two times a day starting the day after chemo for 3 days.      [name] lidocaine-prilocaine (EMLA) cream Apply topically as needed (port).      . LORazepam (ATIVAN) 0.5 MG tablet Take 0.5 mg by mouth every 6 (six) hours as needed (Nausea or vomiting).      . Multiple Vitamin (MULTIVITAMIN WITH MINERALS) TABS tablet Take 1 tablet by mouth daily.      [name]  niacin (NIASPAN) 500 MG CR tablet Take 500 mg by mouth every other day. At bedtime      . omega-3 acid ethyl esters (LOVAZA) 1 G capsule Take 1 g by mouth daily.      . ondansetron (ZOFRAN) 8 MG tablet Take 8 mg by mouth 2 (two) times daily. Take two times a day starting the day after chemo for 3 days. Then take two times a day as needed for nausea or vomiting.      [name] oxyCODONE (OXY IR/ROXICODONE) 5 MG immediate release tablet       . oxyCODONE-acetaminophen (PERCOCET) 10-325 MG per tablet Take 1 tablet by mouth every 6 (six) hours as needed for pain.  20 tablet  0  . Potassium 99 MG TABS Take 1  tablet by mouth daily.      . prochlorperazine (COMPAZINE) 10 MG tablet Take 10 mg by mouth every 6 (six) hours as needed for nausea or vomiting.      [name] UNABLE TO FIND Rx: L8000- Mastectomy Bra (Quantity: 2) Dx: 174.9; Left Mastectomy      . valsartan-hydrochlorothiazide (DIOVAN-HCT) 80-12.5 MG per tablet Take 1 tablet by mouth every morning.       No current facility-administered medications for this visit.    SURGICAL HISTORY:  Past Surgical History  Procedure Laterality Date  . Percutaneous coronary stent intervention (pci-s)    . Abdominal hysterectomy    . Nephrectomy living donor Left 2000  . Knee arthroscopy      knee  . Mastectomy modified radical Left 11/12/2013    Procedure: MASTECTOMY MODIFIED RADICAL;  Surgeon: Rolm Bookbinder, MD;  Location: [facility];  Service: General;  Laterality: Left;  . Portacath placement Right 12/16/2013    Procedure: INSERTION PORT-A-CATH;  Surgeon: Rolm Bookbinder, MD;  Location: [facility];  Service: General;  Laterality: Right;    REVIEW OF SYSTEMS:  Pertinent items are noted in HPI.   HEALTH MAINTENANCE:    PHYSICAL EXAMINATION: Blood pressure 95/57, pulse 106, temperature 98.1 F (36.7 C), temperature source Oral, resp. rate 18, height _0  (1.727 m), weight 193 lb 8 oz (87.771 kg). Body mass index is 29.43 kg/(m^2). ECOG PERFORMANCE STATUS: 0 - Asymptomatic   General appearance: alert, cooperative and appears stated age Lymph nodes: Cervical, supraclavicular, and axillary nodes normal. Resp: clear to auscultation bilaterally Back: symmetric, no curvature. ROM normal. No CVA tenderness. Cardio: regular rate and rhythm GI: soft, non-tender; bowel sounds normal; no masses,  no organomegaly Extremities: extremities normal, atraumatic, no cyanosis or edema Neurologic: Grossly normal   LABORATORY DATA: Lab Results  Component Value Date   WBC 12.5* 12/24/2013   HGB 14.0 12/24/2013   HCT 42.0 12/24/2013   MCV 90.7 12/24/2013   PLT 164  12/24/2013      Chemistry      Component Value Date/Time   NA 143 12/10/2013 1150   NA 142 11/03/2013 1426   K 3.7 12/10/2013 1150   K 4.1 11/03/2013 1426   CL 102 12/10/2013 1150   CO2 28 12/10/2013 1150   CO2 27 11/03/2013 1426   BUN 23 12/10/2013 1150   BUN 25.2 11/03/2013 1426   CREATININE 0.99 12/10/2013 1150   CREATININE 1.1 11/03/2013 1426      Component Value Date/Time   CALCIUM 9.6 12/10/2013 1150   CALCIUM 10.4 11/03/2013 1426   ALKPHOS 60 12/10/2013 1150   ALKPHOS 59 11/03/2013 1426   AST 26 12/10/2013 1150   AST 38* 11/03/2013 1426  ALT 39* 12/10/2013 1150   ALT 45 11/03/2013 1426   BILITOT 0.3 12/10/2013 1150   BILITOT 0.77 11/03/2013 1426       RADIOGRAPHIC STUDIES:  No results found.  ASSESSMENT/PLAN: 73 year old female with  #1 stage II invasive ductal carcinoma node positive status post left mastectomy on 11/12/2013. Tumor was ER positive, node positive.  #2 patient had PET/CT performed which revealed no evidence of distant metastatic disease.  #3 Patient receive cycle 1 day 1 of Taxotere Cytoxan 1 week ago. Overall she tolerated it well.  #4 dehydration: We will give her IV fluids today. She is encouraged to continue good oral intake.  #5 patient will be seen back in 2 weeks' time for cycle 2 of chemotherapy   All questions were answered. The patient knows to call the clinic with any problems, questions or concerns. We can certainly see the patient much sooner if necessary.  I spent 20 minutes counseling the patient face to face. The total time spent in the appointment was 62 minutes   Marcy Panning, MD Medical/Oncology [facility] [phone] (beeper) [phone] (Office)

## 2013-12-25 ENCOUNTER — Ambulatory Visit: Payer: Medicare Other

## 2013-12-26 ENCOUNTER — Encounter: Payer: Self-pay | Admitting: Oncology

## 2014-01-06 ENCOUNTER — Encounter: Payer: Self-pay | Admitting: Cardiology

## 2014-01-06 DIAGNOSIS — I739 Peripheral vascular disease, unspecified: Secondary | ICD-10-CM

## 2014-01-06 DIAGNOSIS — I10 Essential (primary) hypertension: Secondary | ICD-10-CM | POA: Insufficient documentation

## 2014-01-06 DIAGNOSIS — I779 Disorder of arteries and arterioles, unspecified: Secondary | ICD-10-CM | POA: Insufficient documentation

## 2014-01-06 DIAGNOSIS — Z9861 Coronary angioplasty status: Secondary | ICD-10-CM

## 2014-01-06 DIAGNOSIS — E669 Obesity, unspecified: Secondary | ICD-10-CM | POA: Insufficient documentation

## 2014-01-06 DIAGNOSIS — I2510 Atherosclerotic heart disease of native coronary artery without angina pectoris: Secondary | ICD-10-CM | POA: Insufficient documentation

## 2014-01-06 DIAGNOSIS — E785 Hyperlipidemia, unspecified: Secondary | ICD-10-CM | POA: Insufficient documentation

## 2014-01-06 NOTE — Progress Notes (Unsigned)
Patient ID: [name], female   DOB: 1941/10/23, 73 y.o.   MRN: [id number]   Radha, Coggins  Date of visit:  01/06/2014 DOB:  [birth year]    Age:  73 yrs. Medical record number:  [id number]     Account number:  [id number] Primary Care Provider: Jerilynn Birkenhead ____________________________ CURRENT DIAGNOSES  1. CAD,Native  2. Hypertension-Essential (Benign)  3. Atherosclerosis-Carotid Artery  4. Malignant Neoplasm Of Central Portion Of Left Female Breast  5. Stent Placement  6. Hyperlipidemia ____________________________ ALLERGIES  No Known Allergies ____________________________ MEDICATIONS  1. Calcium 600 600 mg (1,500 mg) tablet, 1 p.o. q.d.  2. Fish Oil 300-1,000 mg capsule, 1 p.o. q.d.  3. multivitamin tablet, 1 p.o. q.d.  4. potassium 99 mg tablet, 1 p.o. daily  5. vitamin E 400 unit capsule, 1 p.o. daily  6. Omega 3 350-400 mg capsule, 1 p.o. daily  7. atorvastatin 40 mg tablet, 1 p.o. daily  8. Niaspan Extended-Release 500 mg tablet extended release 24 hr, 1 p.o. daily  9. Diovan HCT 80-12.5 mg tablet, 1 p.o. q.d.  10. aspirin 81 mg chewable tablet, 1 p.o. daily  11. dexamethasone 4 mg tablet, Take as directed  12. lidocaine-tetracaine 7 %-7 % topical cream, PRN  13. lorazepam 0.5 mg tablet, PRN  14. Zofran 8 mg tablet, Take as directed  15. oxycodone 5 mg tablet, PRN  16. Compazine 10 mg tablet, PRN  17. nitroglycerin 0.4 mg sublingual tablet, PRN ____________________________ CHIEF COMPLAINTS  Followup of CAD,Native  Followup of Hyperlipidemia  Followup of Hypertension-Essential (Benign) ____________________________ HISTORY OF PRESENT ILLNESS Patient seen for cardiac followup. Since she was here last year she was diagnosed with breast cancer and had a mastectomy in December. She is now undergoing chemotherapy with Taxotere and Cytoxan and will have radiation therapy. She is tolerating this fairly well but has lost her hair and has significant nausea. She denies  angina and has no PND, orthopnea, syncope, palpitations, or claudication. Her lipids were evaluated today and are under good control. ____________________________ PAST HISTORY  Past Medical Illnesses:  hypertension, hyperlipidemia, peripheral vascular disease, obesity, breast cancer treated ith chemo radiation and surgery;  Cardiovascular Illnesses:  CAD, Carotid artery disease;  Surgical Procedures:  hysterectomy, Kidney donated to pts daughter, arthroscopic rt knee surgery, breast mastectomy left;  Cardiology Procedures-Invasive:  cardiac cath (left), Taxus stent RCA January 2005;  Cardiology Procedures-Noninvasive:  treadmill cardiolite January 2008;  Cardiac Cath Results:  normal Left main, 70% stenosis proximal LAD, scattered irregularities CFX, 80% stenosis proximal RCA, 95% stenosis mid RCA;  Peripheral Vascular Procedures:  carotid doppler January 2010 (22-02% RICA, 54-27% LICA);  LVEF of 06% documented via nuclear study on 11/28/2007,   ____________________________ CARDIO-PULMONARY TEST DATES EKG Date:  12/22/2012;   Cardiac Cath Date:  11/28/2003;  Stent Placement Date: 11/28/2003;  Nuclear Study Date:  11/27/2006;   ____________________________ SOCIAL HISTORY Alcohol Use:  socially;  Smoking:  used to smoke but quit 1984;  Diet:  regular diet;  Lifestyle:  divorced, 2 sons and 1 daughter;  Exercise:  no regular exercise;  Occupation:  Surveyor, 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 Respiratory: denies dyspnea, cough, wheezing or hemoptysis. Cardiovascular:  please review HPI Abdominal: nausea unrelated to mealsGenitourinary-Female: no dysuria, urgency, frequency, UTIs, or  stress incontinence Musculoskeletal:  denies arthritis,  venous insufficiency, or muscle weakness. Neurological:  denies headaches, stroke, or TIA  ____________________________ PHYSICAL EXAMINATION VITAL SIGNS  Blood Pressure:  132/70 Sitting, Right arm, regular cuff  , 128/72 Standing, Right arm and regular cuff   Pulse:  100/min. Weight:  195.00 lbs. Height:  68"BMI: 29  Constitutional:  pleasant white female, in no acute distress, mildly obese Skin:  warm and dry to touch, no apparent skin lesions, or masses noted. Head:  normocephalic, normal hair pattern, no masses or tenderness ENT:  ears, nose and throat reveal no gross abnormalities.  Dentition good. Neck:  bilateral carotid bruits, R>L, no JVD, no masses, non-tender Chest:  normal symmetry, clear to auscultation., left mastectomy Cardiac:  regular rhythm, normal S1 and S2, No S3 or S4, no murmurs, gallops or rubs detected. Peripheral Pulses:  the femoral,dorsalis pedis, and posterior tibial pulses are full and equal bilaterally with no bruits auscultated. Extremities & Back:  no deformities, clubbing, cyanosis, erythema or edema observed. Normal muscle strength and tone. Neurological:  no gross motor or sensory deficits noted, affect appropriate, oriented x3. ____________________________ MOST RECENT LIPID PANEL 01/06/14  CHOL TOTL 160 mg/dl, LDL 82 NM, HDL 43 mg/dl, TRIGLYCER 174 mg/dl and CHOL/HDL 3.7 (Calc) ____________________________ IMPRESSIONS/PLAN  1. Coronary artery disease with previous stenting of the right coronary artery and residual LAD disease 2. Hypertension controlled 3. Hyperlipidemia under treatment 4. Obesity with need to lose weight  5. Breast cancer currently undergoing chemotherapy following mastectomy  Recommendations:  She clinically looks good today. She is doing well from a cardiac viewpoint and recommended followup in one year. Talked about importance of weight loss with her. ____________________________ TODAYS ORDERS  1. Return Visit: 1 year  2. 12  Lead EKG: 1 year                       ____________________________ Cardiology Physician:  Kerry Hough MD [facility]

## 2014-01-07 ENCOUNTER — Ambulatory Visit (HOSPITAL_BASED_OUTPATIENT_CLINIC_OR_DEPARTMENT_OTHER): Payer: 59

## 2014-01-07 ENCOUNTER — Telehealth: Payer: Self-pay | Admitting: Oncology

## 2014-01-07 ENCOUNTER — Encounter: Payer: Self-pay | Admitting: Oncology

## 2014-01-07 ENCOUNTER — Ambulatory Visit (HOSPITAL_BASED_OUTPATIENT_CLINIC_OR_DEPARTMENT_OTHER): Payer: 59 | Admitting: Oncology

## 2014-01-07 ENCOUNTER — Other Ambulatory Visit (HOSPITAL_BASED_OUTPATIENT_CLINIC_OR_DEPARTMENT_OTHER): Payer: 59

## 2014-01-07 VITALS — BP 121/66 | HR 91 | Temp 98.0°F | Resp 18 | Ht 68.0 in | Wt 194.5 lb

## 2014-01-07 DIAGNOSIS — C50412 Malignant neoplasm of upper-outer quadrant of left female breast: Secondary | ICD-10-CM

## 2014-01-07 DIAGNOSIS — Z5111 Encounter for antineoplastic chemotherapy: Secondary | ICD-10-CM

## 2014-01-07 DIAGNOSIS — C773 Secondary and unspecified malignant neoplasm of axilla and upper limb lymph nodes: Secondary | ICD-10-CM

## 2014-01-07 DIAGNOSIS — I10 Essential (primary) hypertension: Secondary | ICD-10-CM

## 2014-01-07 DIAGNOSIS — E860 Dehydration: Secondary | ICD-10-CM

## 2014-01-07 DIAGNOSIS — C50919 Malignant neoplasm of unspecified site of unspecified female breast: Secondary | ICD-10-CM

## 2014-01-07 LAB — COMPREHENSIVE METABOLIC PANEL (CC13)
ALT: 40 U/L (ref 0–55)
AST: 23 U/L (ref 5–34)
Albumin: 4.2 g/dL (ref 3.5–5.0)
Alkaline Phosphatase: 69 U/L (ref 40–150)
Anion Gap: 11 mEq/L (ref 3–11)
BUN: 27.8 mg/dL — ABNORMAL HIGH (ref 7.0–26.0)
CO2: 25 mEq/L (ref 22–29)
Calcium: 10.5 mg/dL — ABNORMAL HIGH (ref 8.4–10.4)
Chloride: 104 mEq/L (ref 98–109)
Creatinine: 0.9 mg/dL (ref 0.6–1.1)
Glucose: 106 mg/dl (ref 70–140)
Potassium: 4 mEq/L (ref 3.5–5.1)
Sodium: 140 mEq/L (ref 136–145)
Total Bilirubin: 0.35 mg/dL (ref 0.20–1.20)
Total Protein: 7.1 g/dL (ref 6.4–8.3)

## 2014-01-07 LAB — CBC WITH DIFFERENTIAL/PLATELET
BASO%: 0.1 % (ref 0.0–2.0)
Basophils Absolute: 0 10*3/uL (ref 0.0–0.1)
EOS%: 0.1 % (ref 0.0–7.0)
Eosinophils Absolute: 0 10*3/uL (ref 0.0–0.5)
HCT: 38.5 % (ref 34.8–46.6)
HGB: 12.7 g/dL (ref 11.6–15.9)
LYMPH%: 8.2 % — ABNORMAL LOW (ref 14.0–49.7)
MCH: 30 pg (ref 25.1–34.0)
MCHC: 33 g/dL (ref 31.5–36.0)
MCV: 90.8 fL (ref 79.5–101.0)
MONO#: 1.7 10*3/uL — ABNORMAL HIGH (ref 0.1–0.9)
MONO%: 12 % (ref 0.0–14.0)
NEUT#: 11.4 10*3/uL — ABNORMAL HIGH (ref 1.5–6.5)
NEUT%: 79.6 % — ABNORMAL HIGH (ref 38.4–76.8)
Platelets: 304 10*3/uL (ref 145–400)
RBC: 4.24 10*6/uL (ref 3.70–5.45)
RDW: 14.7 % — ABNORMAL HIGH (ref 11.2–14.5)
WBC: 14.4 10*3/uL — ABNORMAL HIGH (ref 3.9–10.3)
lymph#: 1.2 10*3/uL (ref 0.9–3.3)

## 2014-01-07 MED ORDER — SODIUM CHLORIDE 0.9 % IV SOLN
1000.0000 mL | INTRAVENOUS | Status: AC
  Administered 2014-01-07: 1000 mL via INTRAVENOUS

## 2014-01-07 MED ORDER — DEXAMETHASONE SODIUM PHOSPHATE 20 MG/5ML IJ SOLN
INTRAMUSCULAR | Status: AC
  Filled 2014-01-07: qty 5

## 2014-01-07 MED ORDER — DEXAMETHASONE SODIUM PHOSPHATE 20 MG/5ML IJ SOLN
20.0000 mg | Freq: Once | INTRAMUSCULAR | Status: AC
  Administered 2014-01-07: 20 mg via INTRAVENOUS

## 2014-01-07 MED ORDER — SODIUM CHLORIDE 0.9 % IV SOLN
Freq: Once | INTRAVENOUS | Status: AC
  Administered 2014-01-07: 12:00:00 via INTRAVENOUS

## 2014-01-07 MED ORDER — SODIUM CHLORIDE 0.9 % IV SOLN
600.0000 mg/m2 | Freq: Once | INTRAVENOUS | Status: AC
  Administered 2014-01-07: 1240 mg via INTRAVENOUS
  Filled 2014-01-07: qty 62

## 2014-01-07 MED ORDER — ONDANSETRON 16 MG/50ML IVPB (CHCC)
16.0000 mg | Freq: Once | INTRAVENOUS | Status: AC
  Administered 2014-01-07: 16 mg via INTRAVENOUS

## 2014-01-07 MED ORDER — SODIUM CHLORIDE 0.9 % IJ SOLN
10.0000 mL | INTRAMUSCULAR | Status: DC | PRN
  Administered 2014-01-07: 10 mL
  Filled 2014-01-07: qty 10

## 2014-01-07 MED ORDER — DOCETAXEL CHEMO INJECTION 160 MG/16ML
75.0000 mg/m2 | Freq: Once | INTRAVENOUS | Status: AC
  Administered 2014-01-07: 160 mg via INTRAVENOUS
  Filled 2014-01-07: qty 16

## 2014-01-07 MED ORDER — HEPARIN SOD (PORK) LOCK FLUSH 100 UNIT/ML IV SOLN
500.0000 [IU] | Freq: Once | INTRAVENOUS | Status: AC | PRN
  Administered 2014-01-07: 500 [IU]
  Filled 2014-01-07: qty 5

## 2014-01-07 MED ORDER — ONDANSETRON 16 MG/50ML IVPB (CHCC)
INTRAVENOUS | Status: AC
  Filled 2014-01-07: qty 16

## 2014-01-07 NOTE — Patient Instructions (Signed)
[facility] Discharge Instructions for Patients Receiving Chemotherapy  Today you received the following chemotherapy agents:  Taxotere, cytoxan  To help prevent nausea and vomiting after your treatment, we encourage you to take your nausea medication.  Take it as often as prescribed.     If you develop nausea and vomiting that is not controlled by your nausea medication, call the clinic. If it is after clinic hours your family physician or the after hours number for the clinic or go to the Emergency Department.   BELOW ARE SYMPTOMS THAT SHOULD BE REPORTED IMMEDIATELY:  *FEVER GREATER THAN 100.5 F  *CHILLS WITH OR WITHOUT FEVER  NAUSEA AND VOMITING THAT IS NOT CONTROLLED WITH YOUR NAUSEA MEDICATION  *UNUSUAL SHORTNESS OF BREATH  *UNUSUAL BRUISING OR BLEEDING  TENDERNESS IN MOUTH AND THROAT WITH OR WITHOUT PRESENCE OF ULCERS  *URINARY PROBLEMS  *BOWEL PROBLEMS  UNUSUAL RASH Items with * indicate a potential emergency and should be followed up as soon as possible.  Feel free to call the clinic you have any questions or concerns. The clinic phone number is [phone].   I have been informed and understand all the instructions given to me. I know to contact the clinic, my physician, or go to the Emergency Department if any problems should occur. I do not have any questions at this time, but understand that I may call the clinic during office hours   should I have any questions or need assistance in obtaining follow up care.    __________________________________________  _____________  __________ Signature of Patient or Authorized Representative            Date                   Time    __________________________________________ Nurse's Signature

## 2014-01-07 NOTE — Telephone Encounter (Signed)
added IVF's for 2/20 after f/u - start time has not changed. pt already on schedule for tomorrow.

## 2014-01-07 NOTE — Progress Notes (Signed)
OFFICE PROGRESS NOTE  CC**  Stephens Shire, MD [address] [name] [address] Dr. Ty Hilts Dr. Thea Silversmith  DIAGNOSIS:  73 year old female with new diagnosis of invasive ductal carcinoma of the left breast found on self examination  Breast cancer of upper-outer quadrant of left female breast   Primary site: Breast   Staging method: AJCC 7th Edition   Clinical: Stage IIB (T2, N1, cM0) signed by Thea Silversmith, MD on 11/04/2013 11:07 AM   Summary: Stage IIB (T2, N1, cM0)  PRIOR THERAPY: 1. palpated a mass in her left breast. Because of this she had a diagnostic mammogram and ultrasound performed on 10/27/2013. This showed a spiculated mass in the 6:00 position measuring approximately 3 cm. Ultrasound confirmed the hypoechoic mass at the 6:00 position measuring 2.6 x 2.1 x 1.5 cm. There was also noted to be a second mass in the 12:00 position measuring 2.1 x 1.8 x 2.1 cm. 2 abnormal lymph nodes were noted in the left axilla largest one measuring 1.2 cm. Patient had a biopsy of the primary breast masses. She also had a biopsy of the lymph node. All 3 biopsies were positive for invasive ductal carcinoma. Tumor was ER 100% PR 100% positive HER-2/neu negative with a proliferation marker Ki-67 low at 14% and low  #2 patient is status post left mastectomy  Performed on 11/12/2013. The final pathology revealed: Breast, modified radical mastectomy , left - INVASIVE DUCTAL CARCINOMA. - DUCTAL CARCINOMA IN SITU. - MARGINS NOT INVOLVED. 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 - METASTATIC CARCINOMA IN 5 OF 10 LYMPH NODES (5/10).  #3 adjuvant chemotherapy consisting of Taxotere Cytoxan every 3 weeks for a total of 6 cycles curative intent beginning 12/17/2013  CURRENT THERAPY:  Taxotere Cytoxan cycle 2 DAY1  INTERVAL HISTORY: [name] 73 y.o. female returns for followup visit  She denies any headaches double vision fevers chills night sweats shortness  of breath chest pains palpitations no myalgias and arthralgias. She is weak tired and has some dizziness. She is somewhat dehydrated. Urine output has slowed down.Remainder of the 10 point review of systems is negative.  MEDICAL HISTORY: Past Medical History  Diagnosis Date  . Hypertension   . CAD (coronary artery disease)   . Hyperlipidemia   . Peripheral vascular disease     atherosclerosis carotid artery-mild  . Breast cancer     left breast    ALLERGIES:  has No Known Allergies.  MEDICATIONS:  Current Outpatient Prescriptions  Medication Sig Dispense Refill  . aspirin EC 81 MG tablet Take 81 mg by mouth daily.      [name] atorvastatin (LIPITOR) 40 MG tablet Take 20 mg by mouth at bedtime.      . Calcium Carb-Cholecalciferol (CALCIUM 600 + D PO) Take 1 tablet by mouth daily.      [name] dexamethasone (DECADRON) 4 MG tablet Take 8 mg by mouth 2 (two) times daily with a meal. Take two times a day the day before Taxotere. Then take two times a day starting the day after chemo for 3 days.      [name] lidocaine-prilocaine (EMLA) cream Apply topically as needed (port).      . Multiple Vitamin (MULTIVITAMIN WITH MINERALS) TABS tablet Take 1 tablet by mouth daily.      . niacin (NIASPAN) 500 MG CR tablet Take 500 mg by mouth every other day. At bedtime      . omega-3 acid ethyl esters (LOVAZA) 1 G capsule Take  1 g by mouth daily.      . Potassium 99 MG TABS Take 1 tablet by mouth daily.      [name] UNABLE TO FIND Rx: L8000- Mastectomy Bra (Quantity: 2) Dx: 174.9; Left Mastectomy      . valsartan-hydrochlorothiazide (DIOVAN-HCT) 80-12.5 MG per tablet Take 1 tablet by mouth every morning.      [name] LORazepam (ATIVAN) 0.5 MG tablet Take 0.5 mg by mouth every 6 (six) hours as needed (Nausea or vomiting).      [name] NITROSTAT 0.4 MG SL tablet       . ondansetron (ZOFRAN) 8 MG tablet Take 8 mg by mouth 2 (two) times daily. Take two times a day starting the day after chemo for 3 days. Then take two times a day as needed  for nausea or vomiting.      [name] oxyCODONE (OXY IR/ROXICODONE) 5 MG immediate release tablet       . oxyCODONE-acetaminophen (PERCOCET) 10-325 MG per tablet Take 1 tablet by mouth every 6 (six) hours as needed for pain.  20 tablet  0  . prochlorperazine (COMPAZINE) 10 MG tablet Take 10 mg by mouth every 6 (six) hours as needed for nausea or vomiting.       No current facility-administered medications for this visit.    SURGICAL HISTORY:  Past Surgical History  Procedure Laterality Date  . Percutaneous coronary stent intervention (pci-s)    . Abdominal hysterectomy    . Nephrectomy living donor Left 2000  . Knee arthroscopy      knee  . Mastectomy modified radical Left 11/12/2013    Procedure: MASTECTOMY MODIFIED RADICAL;  Surgeon: Rolm Bookbinder, MD;  Location: [facility];  Service: General;  Laterality: Left;  . Portacath placement Right 12/16/2013    Procedure: INSERTION PORT-A-CATH;  Surgeon: Rolm Bookbinder, MD;  Location: [facility];  Service: General;  Laterality: Right;    REVIEW OF SYSTEMS:  Pertinent items are noted in HPI.   HEALTH MAINTENANCE:    PHYSICAL EXAMINATION: Blood pressure 121/66, pulse 91, temperature 98 F (36.7 C), temperature source Oral, resp. rate 18, height 5' 8"  (1.727 m), weight 194 lb 8 oz (88.225 kg). Body mass index is 29.58 kg/(m^2). ECOG PERFORMANCE STATUS: 0 - Asymptomatic   General appearance: alert, cooperative and appears stated age Lymph nodes: Cervical, supraclavicular, and axillary nodes normal. Resp: clear to auscultation bilaterally Back: symmetric, no curvature. ROM normal. No CVA tenderness. Cardio: regular rate and rhythm GI: soft, non-tender; bowel sounds normal; no masses,  no organomegaly Extremities: extremities normal, atraumatic, no cyanosis or edema Neurologic: Grossly normal   LABORATORY DATA: Lab Results  Component Value Date   WBC 14.4* 01/07/2014   HGB 12.7 01/07/2014   HCT 38.5 01/07/2014   MCV 90.8 01/07/2014   PLT  304 01/07/2014      Chemistry      Component Value Date/Time   NA 138 12/24/2013 1118   NA 143 12/10/2013 1150   K 4.5 12/24/2013 1118   K 3.7 12/10/2013 1150   CL 102 12/10/2013 1150   CO2 25 12/24/2013 1118   CO2 28 12/10/2013 1150   BUN 22.3 12/24/2013 1118   BUN 23 12/10/2013 1150   CREATININE 1.2* 12/24/2013 1118   CREATININE 0.99 12/10/2013 1150      Component Value Date/Time   CALCIUM 10.3 12/24/2013 1118   CALCIUM 9.6 12/10/2013 1150   ALKPHOS 73 12/24/2013 1118   ALKPHOS 60 12/10/2013 1150   AST 23 12/24/2013 1118  AST 26 12/10/2013 1150   ALT 33 12/24/2013 1118   ALT 39* 12/10/2013 1150   BILITOT 0.49 12/24/2013 1118   BILITOT 0.3 12/10/2013 1150       RADIOGRAPHIC STUDIES:  No results found.  ASSESSMENT/PLAN: 73 year old female with  #1 stage II invasive ductal carcinoma node positive status post left mastectomy on 11/12/2013. Tumor was ER positive, node positive.  #2 patient had PET/CT performed which revealed no evidence of distant metastatic disease.  #3 Here for cycle 2 day 1 of Taxotere Cytoxan 1 week ago.   #4 dehydration: we will give her IVF today and tomorrow as well as on day 8 of treatment  #5 patient will be seen back in 1 weeks' time for follow up and blood work   All questions were answered. The patient knows to call the clinic with any problems, questions or concerns. We can certainly see the patient much sooner if necessary.  I spent 20 minutes counseling the patient face to face. The total time spent in the appointment was 44 minutes   Marcy Panning, MD Medical/Oncology [facility] [phone] (beeper) [phone] (Office)

## 2014-01-08 ENCOUNTER — Ambulatory Visit (HOSPITAL_BASED_OUTPATIENT_CLINIC_OR_DEPARTMENT_OTHER): Payer: 59

## 2014-01-08 ENCOUNTER — Ambulatory Visit: Payer: 59

## 2014-01-08 VITALS — BP 135/65 | HR 74 | Temp 97.3°F | Resp 18

## 2014-01-08 DIAGNOSIS — Z5189 Encounter for other specified aftercare: Secondary | ICD-10-CM

## 2014-01-08 DIAGNOSIS — C773 Secondary and unspecified malignant neoplasm of axilla and upper limb lymph nodes: Secondary | ICD-10-CM

## 2014-01-08 DIAGNOSIS — C50919 Malignant neoplasm of unspecified site of unspecified female breast: Secondary | ICD-10-CM

## 2014-01-08 DIAGNOSIS — C50412 Malignant neoplasm of upper-outer quadrant of left female breast: Secondary | ICD-10-CM

## 2014-01-08 DIAGNOSIS — E860 Dehydration: Secondary | ICD-10-CM

## 2014-01-08 MED ORDER — SODIUM CHLORIDE 0.9 % IV SOLN
INTRAVENOUS | Status: DC
  Administered 2014-01-08: 10:00:00 via INTRAVENOUS

## 2014-01-08 MED ORDER — HEPARIN SOD (PORK) LOCK FLUSH 100 UNIT/ML IV SOLN
500.0000 [IU] | Freq: Once | INTRAVENOUS | Status: AC
  Administered 2014-01-08: 500 [IU]
  Filled 2014-01-08: qty 5

## 2014-01-08 MED ORDER — SODIUM CHLORIDE 0.9 % IJ SOLN
10.0000 mL | Freq: Once | INTRAMUSCULAR | Status: AC
  Administered 2014-01-08: 10 mL
  Filled 2014-01-08: qty 10

## 2014-01-08 MED ORDER — PEGFILGRASTIM INJECTION 6 MG/0.6ML
6.0000 mg | Freq: Once | SUBCUTANEOUS | Status: AC
  Administered 2014-01-08: 6 mg via SUBCUTANEOUS

## 2014-01-08 NOTE — Progress Notes (Signed)
Duplicate appt.

## 2014-01-08 NOTE — Patient Instructions (Signed)
Dehydration, Adult Dehydration is when you lose more fluids from the body than you take in. Vital organs like the kidneys, brain, and heart cannot function without a proper amount of fluids and salt. Any loss of fluids from the body can cause dehydration.  CAUSES   Vomiting.  Diarrhea.  Excessive sweating.  Excessive urine output.  Fever. SYMPTOMS  Mild dehydration  Thirst.  Dry lips.  Slightly dry mouth. Moderate dehydration  Very dry mouth.  Sunken eyes.  Skin does not bounce back quickly when lightly pinched and released.  Dark urine and decreased urine production.  Decreased tear production.  Headache. Severe dehydration  Very dry mouth.  Extreme thirst.  Rapid, weak pulse (more than 100 beats per minute at rest).  Cold hands and feet.  Not able to sweat in spite of heat and temperature.  Rapid breathing.  Blue lips.  Confusion and lethargy.  Difficulty being awakened.  Minimal urine production.  No tears. DIAGNOSIS  Your caregiver will diagnose dehydration based on your symptoms and your exam. Blood and urine tests will help confirm the diagnosis. The diagnostic evaluation should also identify the cause of dehydration. TREATMENT  Treatment of mild or moderate dehydration can often be done at home by increasing the amount of fluids that you drink. It is best to drink small amounts of fluid more often. Drinking too much at one time can make vomiting worse. Refer to the home care instructions below. Severe dehydration needs to be treated at the hospital where you will probably be given intravenous (IV) fluids that contain water and electrolytes. HOME CARE INSTRUCTIONS   Ask your caregiver about specific rehydration instructions.  Drink enough fluids to keep your urine clear or pale yellow.  Drink small amounts frequently if you have nausea and vomiting.  Eat as you normally do.  Avoid:  Foods or drinks high in sugar.  Carbonated  drinks.  Juice.  Extremely hot or cold fluids.  Drinks with caffeine.  Fatty, greasy foods.  Alcohol.  Tobacco.  Overeating.  Gelatin desserts.  Wash your hands well to avoid spreading bacteria and viruses.  Only take over-the-counter or prescription medicines for pain, discomfort, or fever as directed by your caregiver.  Ask your caregiver if you should continue all prescribed and over-the-counter medicines.  Keep all follow-up appointments with your caregiver. SEEK MEDICAL CARE IF:  You have abdominal pain and it increases or stays in one area (localizes).  You have a rash, stiff neck, or severe headache.  You are irritable, sleepy, or difficult to awaken.  You are weak, dizzy, or extremely thirsty. SEEK IMMEDIATE MEDICAL CARE IF:   You are unable to keep fluids down or you get worse despite treatment.  You have frequent episodes of vomiting or diarrhea.  You have blood or green matter (bile) in your vomit.  You have blood in your stool or your stool looks black and tarry.  You have not urinated in 6 to 8 hours, or you have only urinated a small amount of very dark urine.  You have a fever.  You faint. MAKE SURE YOU:   Understand these instructions.  Will watch your condition.  Will get help right away if you are not doing well or get worse. Document Released: 11/11/2005 Document Revised: 02/03/2012 Document Reviewed: 07/01/2011 [employer] Patient Information 2014 [employer].  

## 2014-01-14 ENCOUNTER — Ambulatory Visit (HOSPITAL_BASED_OUTPATIENT_CLINIC_OR_DEPARTMENT_OTHER): Payer: 59

## 2014-01-14 ENCOUNTER — Encounter: Payer: Self-pay | Admitting: Oncology

## 2014-01-14 ENCOUNTER — Ambulatory Visit (HOSPITAL_BASED_OUTPATIENT_CLINIC_OR_DEPARTMENT_OTHER): Payer: 59 | Admitting: Oncology

## 2014-01-14 ENCOUNTER — Other Ambulatory Visit (HOSPITAL_BASED_OUTPATIENT_CLINIC_OR_DEPARTMENT_OTHER): Payer: 59

## 2014-01-14 VITALS — BP 117/45 | HR 88 | Temp 97.4°F | Resp 18

## 2014-01-14 VITALS — BP 132/70 | HR 100 | Temp 97.9°F | Resp 20 | Ht 68.0 in | Wt 189.6 lb

## 2014-01-14 DIAGNOSIS — C50919 Malignant neoplasm of unspecified site of unspecified female breast: Secondary | ICD-10-CM

## 2014-01-14 DIAGNOSIS — E860 Dehydration: Secondary | ICD-10-CM

## 2014-01-14 DIAGNOSIS — C773 Secondary and unspecified malignant neoplasm of axilla and upper limb lymph nodes: Secondary | ICD-10-CM

## 2014-01-14 DIAGNOSIS — C50412 Malignant neoplasm of upper-outer quadrant of left female breast: Secondary | ICD-10-CM

## 2014-01-14 DIAGNOSIS — Z170 Estrogen receptor positive status [ER+]: Secondary | ICD-10-CM

## 2014-01-14 LAB — CBC WITH DIFFERENTIAL/PLATELET
BASO%: 0.5 % (ref 0.0–2.0)
Basophils Absolute: 0.1 10*3/uL (ref 0.0–0.1)
EOS%: 0.8 % (ref 0.0–7.0)
Eosinophils Absolute: 0.1 10*3/uL (ref 0.0–0.5)
HCT: 38.6 % (ref 34.8–46.6)
HGB: 12.9 g/dL (ref 11.6–15.9)
LYMPH%: 10.9 % — ABNORMAL LOW (ref 14.0–49.7)
MCH: 29.9 pg (ref 25.1–34.0)
MCHC: 33.3 g/dL (ref 31.5–36.0)
MCV: 89.8 fL (ref 79.5–101.0)
MONO#: 1.5 10*3/uL — ABNORMAL HIGH (ref 0.1–0.9)
MONO%: 14.8 % — ABNORMAL HIGH (ref 0.0–14.0)
NEUT#: 7.6 10*3/uL — ABNORMAL HIGH (ref 1.5–6.5)
NEUT%: 73 % (ref 38.4–76.8)
Platelets: 210 10*3/uL (ref 145–400)
RBC: 4.3 10*6/uL (ref 3.70–5.45)
RDW: 14.5 % (ref 11.2–14.5)
WBC: 10.4 10*3/uL — ABNORMAL HIGH (ref 3.9–10.3)
lymph#: 1.1 10*3/uL (ref 0.9–3.3)

## 2014-01-14 LAB — COMPREHENSIVE METABOLIC PANEL (CC13)
ALT: 33 U/L (ref 0–55)
AST: 24 U/L (ref 5–34)
Albumin: 3.7 g/dL (ref 3.5–5.0)
Alkaline Phosphatase: 79 U/L (ref 40–150)
Anion Gap: 11 mEq/L (ref 3–11)
BUN: 30.3 mg/dL — ABNORMAL HIGH (ref 7.0–26.0)
CO2: 24 mEq/L (ref 22–29)
Calcium: 9.5 mg/dL (ref 8.4–10.4)
Chloride: 103 mEq/L (ref 98–109)
Creatinine: 1 mg/dL (ref 0.6–1.1)
Glucose: 105 mg/dl (ref 70–140)
Potassium: 3.6 mEq/L (ref 3.5–5.1)
Sodium: 137 mEq/L (ref 136–145)
Total Bilirubin: 0.54 mg/dL (ref 0.20–1.20)
Total Protein: 6.2 g/dL — ABNORMAL LOW (ref 6.4–8.3)

## 2014-01-14 MED ORDER — SODIUM CHLORIDE 0.9 % IV SOLN
Freq: Once | INTRAVENOUS | Status: AC
  Administered 2014-01-14: 14:00:00 via INTRAVENOUS

## 2014-01-14 MED ORDER — HEPARIN SOD (PORK) LOCK FLUSH 100 UNIT/ML IV SOLN
500.0000 [IU] | Freq: Once | INTRAVENOUS | Status: AC
  Administered 2014-01-14: 500 [IU] via INTRAVENOUS
  Filled 2014-01-14: qty 5

## 2014-01-14 MED ORDER — SODIUM CHLORIDE 0.9 % IJ SOLN
10.0000 mL | Freq: Once | INTRAMUSCULAR | Status: AC
  Administered 2014-01-14: 10 mL via INTRAVENOUS
  Filled 2014-01-14: qty 10

## 2014-01-14 NOTE — Progress Notes (Signed)
OFFICE PROGRESS NOTE  CC**  Stephens Shire, MD [address] [name] [address] Dr. Ty Hilts Dr. Thea Silversmith  DIAGNOSIS:  73 year old female with new diagnosis of invasive ductal carcinoma of the left breast found on self examination  Breast cancer of upper-outer quadrant of left female breast   Primary site: Breast   Staging method: AJCC 7th Edition   Clinical: Stage IIB (T2, N1, cM0) signed by Thea Silversmith, MD on 11/04/2013 11:07 AM   Summary: Stage IIB (T2, N1, cM0)  PRIOR THERAPY: 1. palpated a mass in her left breast. Because of this she had a diagnostic mammogram and ultrasound performed on 10/27/2013. This showed a spiculated mass in the 6:00 position measuring approximately 3 cm. Ultrasound confirmed the hypoechoic mass at the 6:00 position measuring 2.6 x 2.1 x 1.5 cm. There was also noted to be a second mass in the 12:00 position measuring 2.1 x 1.8 x 2.1 cm. 2 abnormal lymph nodes were noted in the left axilla largest one measuring 1.2 cm. Patient had a biopsy of the primary breast masses. She also had a biopsy of the lymph node. All 3 biopsies were positive for invasive ductal carcinoma. Tumor was ER 100% PR 100% positive HER-2/neu negative with a proliferation marker Ki-67 low at 14% and low  #2 patient is status post left mastectomy  Performed on 11/12/2013. The final pathology revealed: Breast, modified radical mastectomy , left - INVASIVE DUCTAL CARCINOMA. - DUCTAL CARCINOMA IN SITU. - MARGINS NOT INVOLVED. 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 - METASTATIC CARCINOMA IN 5 OF 10 LYMPH NODES (5/10).  #3 adjuvant chemotherapy consisting of Taxotere Cytoxan every 3 weeks for a total of 6 cycles curative intent beginning 12/17/2013  CURRENT THERAPY:  S/p Taxotere Cytoxan cycle 2 DAY 8  INTERVAL HISTORY: [name] 73 y.o. female returns for followup visit  She denies any headaches double vision fevers chills night sweats  shortness of breath chest pains palpitations no myalgias and arthralgias. She is weak tired and has some dizziness. She is somewhat dehydrated. Urine output has slowed down. I will plan to give her IVF today. Remainder of the 10 point review of systems is negative.  MEDICAL HISTORY: Past Medical History  Diagnosis Date  . Hypertension   . CAD (coronary artery disease)   . Hyperlipidemia   . Peripheral vascular disease     atherosclerosis carotid artery-mild  . Breast cancer     left breast    ALLERGIES:  has No Known Allergies.  MEDICATIONS:  Current Outpatient Prescriptions  Medication Sig Dispense Refill  . aspirin EC 81 MG tablet Take 81 mg by mouth daily.      [name] atorvastatin (LIPITOR) 40 MG tablet Take 20 mg by mouth at bedtime.      . Calcium Carb-Cholecalciferol (CALCIUM 600 + D PO) Take 1 tablet by mouth daily.      [name] dexamethasone (DECADRON) 4 MG tablet Take 8 mg by mouth 2 (two) times daily with a meal. Take two times a day the day before Taxotere. Then take two times a day starting the day after chemo for 3 days.      [name] lidocaine-prilocaine (EMLA) cream Apply topically as needed (port).      . LORazepam (ATIVAN) 0.5 MG tablet Take 0.5 mg by mouth every 6 (six) hours as needed (Nausea or vomiting).      . Multiple Vitamin (MULTIVITAMIN WITH MINERALS) TABS tablet Take 1 tablet by mouth daily.      [name]  niacin (NIASPAN) 500 MG CR tablet Take 500 mg by mouth every other day. At bedtime      . NITROSTAT 0.4 MG SL tablet       . omega-3 acid ethyl esters (LOVAZA) 1 G capsule Take 1 g by mouth daily.      . ondansetron (ZOFRAN) 8 MG tablet Take 8 mg by mouth 2 (two) times daily. Take two times a day starting the day after chemo for 3 days. Then take two times a day as needed for nausea or vomiting.      [name] oxyCODONE (OXY IR/ROXICODONE) 5 MG immediate release tablet       . oxyCODONE-acetaminophen (PERCOCET) 10-325 MG per tablet Take 1 tablet by mouth every 6 (six) hours as needed for  pain.  20 tablet  0  . Potassium 99 MG TABS Take 1 tablet by mouth daily.      . prochlorperazine (COMPAZINE) 10 MG tablet Take 10 mg by mouth every 6 (six) hours as needed for nausea or vomiting.      [name] UNABLE TO FIND Rx: L8000- Mastectomy Bra (Quantity: 2) Dx: 174.9; Left Mastectomy      . valsartan-hydrochlorothiazide (DIOVAN-HCT) 80-12.5 MG per tablet Take 1 tablet by mouth every morning.       No current facility-administered medications for this visit.    SURGICAL HISTORY:  Past Surgical History  Procedure Laterality Date  . Percutaneous coronary stent intervention (pci-s)    . Abdominal hysterectomy    . Nephrectomy living donor Left 2000  . Knee arthroscopy      knee  . Mastectomy modified radical Left 11/12/2013    Procedure: MASTECTOMY MODIFIED RADICAL;  Surgeon: Rolm Bookbinder, MD;  Location: [facility];  Service: General;  Laterality: Left;  . Portacath placement Right 12/16/2013    Procedure: INSERTION PORT-A-CATH;  Surgeon: Rolm Bookbinder, MD;  Location: [facility];  Service: General;  Laterality: Right;    REVIEW OF SYSTEMS:  Pertinent items are noted in HPI.   HEALTH MAINTENANCE:    PHYSICAL EXAMINATION: Blood pressure 132/70, pulse 100, temperature 97.9 F (36.6 C), temperature source Oral, resp. rate 20, height 5' 8"  (1.727 m), weight 189 lb 9.6 oz (86.002 kg). Body mass index is 28.84 kg/(m^2). ECOG PERFORMANCE STATUS: 0 - Asymptomatic   General appearance: alert, cooperative and appears stated age Lymph nodes: Cervical, supraclavicular, and axillary nodes normal. Resp: clear to auscultation bilaterally Back: symmetric, no curvature. ROM normal. No CVA tenderness. Cardio: regular rate and rhythm GI: soft, non-tender; bowel sounds normal; no masses,  no organomegaly Extremities: extremities normal, atraumatic, no cyanosis or edema Neurologic: Grossly normal Left mastectomy site: looks healed, no evidence of infection or inflammation Breasts: right breast  normal without mass, skin or nipple changes or axillary nodes.    LABORATORY DATA: Lab Results  Component Value Date   WBC 10.4* 01/14/2014   HGB 12.9 01/14/2014   HCT 38.6 01/14/2014   MCV 89.8 01/14/2014   PLT 210 01/14/2014      Chemistry      Component Value Date/Time   NA 140 01/07/2014 1048   NA 143 12/10/2013 1150   K 4.0 01/07/2014 1048   K 3.7 12/10/2013 1150   CL 102 12/10/2013 1150   CO2 25 01/07/2014 1048   CO2 28 12/10/2013 1150   BUN 27.8* 01/07/2014 1048   BUN 23 12/10/2013 1150   CREATININE 0.9 01/07/2014 1048   CREATININE 0.99 12/10/2013 1150      Component Value Date/Time  CALCIUM 10.5* 01/07/2014 1048   CALCIUM 9.6 12/10/2013 1150   ALKPHOS 69 01/07/2014 1048   ALKPHOS 60 12/10/2013 1150   AST 23 01/07/2014 1048   AST 26 12/10/2013 1150   ALT 40 01/07/2014 1048   ALT 39* 12/10/2013 1150   BILITOT 0.35 01/07/2014 1048   BILITOT 0.3 12/10/2013 1150       RADIOGRAPHIC STUDIES:  No results found.  ASSESSMENT/PLAN: 73 year old female with  #1 stage II invasive ductal carcinoma node positive status post left mastectomy on 11/12/2013. Tumor was ER positive, node positive.  #2 patient had PET/CT performed which revealed no evidence of distant metastatic disease.  #3 s/p cycle 2 day 8 of Taxotere Cytoxan overall tolerated well. Blood counts look terrific she is not neutropenic  #4 Dehydration: we will give her IVF today.  #5 follow up: patient will be seen back in 2 weeks' time for follow up and blood work and cycle 3 of TC   All questions were answered. The patient knows to call the clinic with any problems, questions or concerns. We can certainly see the patient much sooner if necessary.  I spent 20 minutes counseling the patient face to face. The total time spent in the appointment was 48 minutes   Marcy Panning, MD Medical/Oncology [facility] [phone] (beeper) [phone] (Office)

## 2014-01-14 NOTE — Patient Instructions (Signed)
Dehydration, Adult Dehydration is when you lose more fluids from the body than you take in. Vital organs like the kidneys, brain, and heart cannot function without a proper amount of fluids and salt. Any loss of fluids from the body can cause dehydration.  CAUSES   Vomiting.  Diarrhea.  Excessive sweating.  Excessive urine output.  Fever. SYMPTOMS  Mild dehydration  Thirst.  Dry lips.  Slightly dry mouth. Moderate dehydration  Very dry mouth.  Sunken eyes.  Skin does not bounce back quickly when lightly pinched and released.  Dark urine and decreased urine production.  Decreased tear production.  Headache. Severe dehydration  Very dry mouth.  Extreme thirst.  Rapid, weak pulse (more than 100 beats per minute at rest).  Cold hands and feet.  Not able to sweat in spite of heat and temperature.  Rapid breathing.  Blue lips.  Confusion and lethargy.  Difficulty being awakened.  Minimal urine production.  No tears. DIAGNOSIS  Your caregiver will diagnose dehydration based on your symptoms and your exam. Blood and urine tests will help confirm the diagnosis. The diagnostic evaluation should also identify the cause of dehydration. TREATMENT  Treatment of mild or moderate dehydration can often be done at home by increasing the amount of fluids that you drink. It is best to drink small amounts of fluid more often. Drinking too much at one time can make vomiting worse. Refer to the home care instructions below. Severe dehydration needs to be treated at the hospital where you will probably be given intravenous (IV) fluids that contain water and electrolytes. HOME CARE INSTRUCTIONS   Ask your caregiver about specific rehydration instructions.  Drink enough fluids to keep your urine clear or pale yellow.  Drink small amounts frequently if you have nausea and vomiting.  Eat as you normally do.  Avoid:  Foods or drinks high in sugar.  Carbonated  drinks.  Juice.  Extremely hot or cold fluids.  Drinks with caffeine.  Fatty, greasy foods.  Alcohol.  Tobacco.  Overeating.  Gelatin desserts.  Wash your hands well to avoid spreading bacteria and viruses.  Only take over-the-counter or prescription medicines for pain, discomfort, or fever as directed by your caregiver.  Ask your caregiver if you should continue all prescribed and over-the-counter medicines.  Keep all follow-up appointments with your caregiver. SEEK MEDICAL CARE IF:  You have abdominal pain and it increases or stays in one area (localizes).  You have a rash, stiff neck, or severe headache.  You are irritable, sleepy, or difficult to awaken.  You are weak, dizzy, or extremely thirsty. SEEK IMMEDIATE MEDICAL CARE IF:   You are unable to keep fluids down or you get worse despite treatment.  You have frequent episodes of vomiting or diarrhea.  You have blood or green matter (bile) in your vomit.  You have blood in your stool or your stool looks black and tarry.  You have not urinated in 6 to 8 hours, or you have only urinated a small amount of very dark urine.  You have a fever.  You faint. MAKE SURE YOU:   Understand these instructions.  Will watch your condition.  Will get help right away if you are not doing well or get worse. Document Released: 11/11/2005 Document Revised: 02/03/2012 Document Reviewed: 07/01/2011 [employer] Patient Information 2014 [employer].  

## 2014-01-28 ENCOUNTER — Encounter: Payer: Self-pay | Admitting: Oncology

## 2014-01-28 ENCOUNTER — Ambulatory Visit (HOSPITAL_BASED_OUTPATIENT_CLINIC_OR_DEPARTMENT_OTHER): Payer: 59 | Admitting: Oncology

## 2014-01-28 ENCOUNTER — Other Ambulatory Visit (HOSPITAL_BASED_OUTPATIENT_CLINIC_OR_DEPARTMENT_OTHER): Payer: Medicare Other

## 2014-01-28 ENCOUNTER — Ambulatory Visit (HOSPITAL_BASED_OUTPATIENT_CLINIC_OR_DEPARTMENT_OTHER): Payer: Medicare Other

## 2014-01-28 ENCOUNTER — Telehealth: Payer: Self-pay | Admitting: Oncology

## 2014-01-28 ENCOUNTER — Telehealth: Payer: Self-pay | Admitting: *Deleted

## 2014-01-28 VITALS — BP 101/63 | HR 92 | Temp 97.6°F | Resp 18 | Ht 68.0 in | Wt 189.4 lb

## 2014-01-28 DIAGNOSIS — E860 Dehydration: Secondary | ICD-10-CM

## 2014-01-28 DIAGNOSIS — Z5111 Encounter for antineoplastic chemotherapy: Secondary | ICD-10-CM

## 2014-01-28 DIAGNOSIS — C50412 Malignant neoplasm of upper-outer quadrant of left female breast: Secondary | ICD-10-CM

## 2014-01-28 DIAGNOSIS — C50919 Malignant neoplasm of unspecified site of unspecified female breast: Secondary | ICD-10-CM

## 2014-01-28 DIAGNOSIS — Z171 Estrogen receptor negative status [ER-]: Secondary | ICD-10-CM

## 2014-01-28 LAB — CBC WITH DIFFERENTIAL/PLATELET
BASO%: 0.5 % (ref 0.0–2.0)
Basophils Absolute: 0.1 10*3/uL (ref 0.0–0.1)
EOS%: 0.1 % (ref 0.0–7.0)
Eosinophils Absolute: 0 10*3/uL (ref 0.0–0.5)
HCT: 37.7 % (ref 34.8–46.6)
HGB: 12.4 g/dL (ref 11.6–15.9)
LYMPH%: 6.5 % — ABNORMAL LOW (ref 14.0–49.7)
MCH: 29.8 pg (ref 25.1–34.0)
MCHC: 33 g/dL (ref 31.5–36.0)
MCV: 90.4 fL (ref 79.5–101.0)
MONO#: 1.2 10*3/uL — ABNORMAL HIGH (ref 0.1–0.9)
MONO%: 7.8 % (ref 0.0–14.0)
NEUT#: 13 10*3/uL — ABNORMAL HIGH (ref 1.5–6.5)
NEUT%: 85.1 % — ABNORMAL HIGH (ref 38.4–76.8)
Platelets: 326 10*3/uL (ref 145–400)
RBC: 4.17 10*6/uL (ref 3.70–5.45)
RDW: 15.5 % — ABNORMAL HIGH (ref 11.2–14.5)
WBC: 15.3 10*3/uL — ABNORMAL HIGH (ref 3.9–10.3)
lymph#: 1 10*3/uL (ref 0.9–3.3)

## 2014-01-28 LAB — COMPREHENSIVE METABOLIC PANEL (CC13)
ALT: 34 U/L (ref 0–55)
AST: 22 U/L (ref 5–34)
Albumin: 3.8 g/dL (ref 3.5–5.0)
Alkaline Phosphatase: 75 U/L (ref 40–150)
Anion Gap: 11 mEq/L (ref 3–11)
BUN: 23.2 mg/dL (ref 7.0–26.0)
CO2: 24 mEq/L (ref 22–29)
Calcium: 10.2 mg/dL (ref 8.4–10.4)
Chloride: 107 mEq/L (ref 98–109)
Creatinine: 0.9 mg/dL (ref 0.6–1.1)
Glucose: 102 mg/dl (ref 70–140)
Potassium: 4.1 mEq/L (ref 3.5–5.1)
Sodium: 142 mEq/L (ref 136–145)
Total Bilirubin: 0.38 mg/dL (ref 0.20–1.20)
Total Protein: 6.9 g/dL (ref 6.4–8.3)

## 2014-01-28 MED ORDER — HEPARIN SOD (PORK) LOCK FLUSH 100 UNIT/ML IV SOLN
500.0000 [IU] | Freq: Once | INTRAVENOUS | Status: AC | PRN
  Administered 2014-01-28: 500 [IU]
  Filled 2014-01-28: qty 5

## 2014-01-28 MED ORDER — SODIUM CHLORIDE 0.9 % IV SOLN
600.0000 mg/m2 | Freq: Once | INTRAVENOUS | Status: AC
  Administered 2014-01-28: 1240 mg via INTRAVENOUS
  Filled 2014-01-28: qty 62

## 2014-01-28 MED ORDER — ONDANSETRON 16 MG/50ML IVPB (CHCC)
INTRAVENOUS | Status: AC
  Filled 2014-01-28: qty 16

## 2014-01-28 MED ORDER — SODIUM CHLORIDE 0.9 % IJ SOLN
10.0000 mL | INTRAMUSCULAR | Status: DC | PRN
  Administered 2014-01-28: 10 mL
  Filled 2014-01-28: qty 10

## 2014-01-28 MED ORDER — ONDANSETRON 16 MG/50ML IVPB (CHCC)
16.0000 mg | Freq: Once | INTRAVENOUS | Status: AC
  Administered 2014-01-28: 16 mg via INTRAVENOUS

## 2014-01-28 MED ORDER — SODIUM CHLORIDE 0.9 % IV SOLN
Freq: Once | INTRAVENOUS | Status: AC
  Administered 2014-01-28: 13:00:00 via INTRAVENOUS

## 2014-01-28 MED ORDER — SODIUM CHLORIDE 0.9 % IV SOLN
75.0000 mg/m2 | Freq: Once | INTRAVENOUS | Status: AC
  Administered 2014-01-28: 160 mg via INTRAVENOUS
  Filled 2014-01-28: qty 16

## 2014-01-28 MED ORDER — DEXAMETHASONE SODIUM PHOSPHATE 20 MG/5ML IJ SOLN
INTRAMUSCULAR | Status: AC
  Filled 2014-01-28: qty 5

## 2014-01-28 MED ORDER — DEXAMETHASONE SODIUM PHOSPHATE 20 MG/5ML IJ SOLN
20.0000 mg | Freq: Once | INTRAMUSCULAR | Status: AC
  Administered 2014-01-28: 20 mg via INTRAVENOUS

## 2014-01-28 NOTE — Telephone Encounter (Signed)
, °

## 2014-01-28 NOTE — Patient Instructions (Signed)
[facility] Discharge Instructions for Patients Receiving Chemotherapy  Today you received the following chemotherapy agents: taxotere, cytoxan  To help prevent nausea and vomiting after your treatment, we encourage you to take your nausea medication as prescribed.   If you develop nausea and vomiting that is not controlled by your nausea medication, call the clinic.   BELOW ARE SYMPTOMS THAT SHOULD BE REPORTED IMMEDIATELY:  *FEVER GREATER THAN 100.5 F  *CHILLS WITH OR WITHOUT FEVER  NAUSEA AND VOMITING THAT IS NOT CONTROLLED WITH YOUR NAUSEA MEDICATION  *UNUSUAL SHORTNESS OF BREATH  *UNUSUAL BRUISING OR BLEEDING  TENDERNESS IN MOUTH AND THROAT WITH OR WITHOUT PRESENCE OF ULCERS  *URINARY PROBLEMS  *BOWEL PROBLEMS  UNUSUAL RASH Items with * indicate a potential emergency and should be followed up as soon as possible.  Feel free to call the clinic you have any questions or concerns. The clinic phone number is [phone].

## 2014-01-28 NOTE — Telephone Encounter (Signed)
Per staff message and POF I have scheduled appts.  JMW  

## 2014-01-28 NOTE — Patient Instructions (Signed)
Proceed with chemotherapy cycle 3 of TC  IVF on 3/7 and 3/13  I will see you back on 3/13

## 2014-01-29 ENCOUNTER — Ambulatory Visit: Payer: Medicare Other

## 2014-01-29 ENCOUNTER — Ambulatory Visit (HOSPITAL_BASED_OUTPATIENT_CLINIC_OR_DEPARTMENT_OTHER): Payer: 59

## 2014-01-29 VITALS — BP 101/62 | HR 80 | Temp 95.0°F | Resp 18

## 2014-01-29 DIAGNOSIS — C50919 Malignant neoplasm of unspecified site of unspecified female breast: Secondary | ICD-10-CM

## 2014-01-29 DIAGNOSIS — Z5189 Encounter for other specified aftercare: Secondary | ICD-10-CM

## 2014-01-29 DIAGNOSIS — C50412 Malignant neoplasm of upper-outer quadrant of left female breast: Secondary | ICD-10-CM

## 2014-01-29 DIAGNOSIS — E860 Dehydration: Secondary | ICD-10-CM

## 2014-01-29 MED ORDER — SODIUM CHLORIDE 0.9 % IV SOLN
INTRAVENOUS | Status: DC
  Administered 2014-01-29: 10:00:00 via INTRAVENOUS

## 2014-01-29 MED ORDER — PEGFILGRASTIM INJECTION 6 MG/0.6ML
6.0000 mg | Freq: Once | SUBCUTANEOUS | Status: AC
  Administered 2014-01-29: 6 mg via SUBCUTANEOUS

## 2014-01-31 NOTE — Progress Notes (Signed)
OFFICE PROGRESS NOTE  CC**  Stephens Shire, MD [address] [name] [address] Dr. Ty Hilts Dr. Thea Silversmith  DIAGNOSIS:  73 year old female with new diagnosis of invasive ductal carcinoma of the left breast found on self examination  Breast cancer of upper-outer quadrant of left female breast   Primary site: Breast   Staging method: AJCC 7th Edition   Clinical: Stage IIB (T2, N1, cM0) signed by Thea Silversmith, MD on 11/04/2013 11:07 AM   Summary: Stage IIB (T2, N1, cM0)  PRIOR THERAPY: 1. palpated a mass in her left breast. Because of this she had a diagnostic mammogram and ultrasound performed on 10/27/2013. This showed a spiculated mass in the 6:00 position measuring approximately 3 cm. Ultrasound confirmed the hypoechoic mass at the 6:00 position measuring 2.6 x 2.1 x 1.5 cm. There was also noted to be a second mass in the 12:00 position measuring 2.1 x 1.8 x 2.1 cm. 2 abnormal lymph nodes were noted in the left axilla largest one measuring 1.2 cm. Patient had a biopsy of the primary breast masses. She also had a biopsy of the lymph node. All 3 biopsies were positive for invasive ductal carcinoma. Tumor was ER 100% PR 100% positive HER-2/neu negative with a proliferation marker Ki-67 low at 14% and low  #2 patient is status post left mastectomy  Performed on 11/12/2013. The final pathology revealed: Breast, modified radical mastectomy , left - INVASIVE DUCTAL CARCINOMA. - DUCTAL CARCINOMA IN SITU. - MARGINS NOT INVOLVED. 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 - METASTATIC CARCINOMA IN 5 OF 10 LYMPH NODES (5/10).  #3 adjuvant chemotherapy consisting of Taxotere Cytoxan every 3 weeks for a total of 6 cycles curative intent beginning 12/17/2013  CURRENT THERAPY:   Taxotere Cytoxan cycle 3 DAY 1 on 01/28/14  INTERVAL HISTORY: Drucilla Chalet Ohman 73 y.o. female returns for followup visit prior to cycle 3 of TC. She looks good today. She states that  this is the best she has felt in such a long team. Today she denies any headaches double vision blurring of vision fevers chills night sweats. No shortness of breath chest pains palpitations. No abdominal pain no diarrhea or constipation. She has no easy bruising or bleeding. She has no myalgias and arthralgias. No peripheral paresthesias or gait disturbances. Remainder of the 10 point review of systems is negative.   MEDICAL HISTORY: Past Medical History  Diagnosis Date  . Hypertension   . CAD (coronary artery disease)   . Hyperlipidemia   . Peripheral vascular disease     atherosclerosis carotid artery-mild  . Breast cancer     left breast    ALLERGIES:  has No Known Allergies.  MEDICATIONS:  Current Outpatient Prescriptions  Medication Sig Dispense Refill  . aspirin EC 81 MG tablet Take 81 mg by mouth daily.      [name] atorvastatin (LIPITOR) 40 MG tablet Take 20 mg by mouth at bedtime.      . Calcium Carb-Cholecalciferol (CALCIUM 600 + D PO) Take 1 tablet by mouth daily.      [name] dexamethasone (DECADRON) 4 MG tablet Take 8 mg by mouth 2 (two) times daily with a meal. Take two times a day the day before Taxotere. Then take two times a day starting the day after chemo for 3 days.      [name] lidocaine-prilocaine (EMLA) cream Apply topically as needed (port).      . LORazepam (ATIVAN) 0.5 MG tablet Take 0.5 mg by mouth  every 6 (six) hours as needed (Nausea or vomiting).      . Multiple Vitamin (MULTIVITAMIN WITH MINERALS) TABS tablet Take 1 tablet by mouth daily.      . niacin (NIASPAN) 500 MG CR tablet Take 500 mg by mouth every other day. At bedtime      . NITROSTAT 0.4 MG SL tablet       . omega-3 acid ethyl esters (LOVAZA) 1 G capsule Take 1 g by mouth daily.      . ondansetron (ZOFRAN) 8 MG tablet Take 8 mg by mouth 2 (two) times daily. Take two times a day starting the day after chemo for 3 days. Then take two times a day as needed for nausea or vomiting.      [name] oxyCODONE (OXY  IR/ROXICODONE) 5 MG immediate release tablet       . oxyCODONE-acetaminophen (PERCOCET) 10-325 MG per tablet Take 1 tablet by mouth every 6 (six) hours as needed for pain.  20 tablet  0  . Potassium 99 MG TABS Take 1 tablet by mouth daily.      . prochlorperazine (COMPAZINE) 10 MG tablet Take 10 mg by mouth every 6 (six) hours as needed for nausea or vomiting.      [name] UNABLE TO FIND Rx: L8000- Mastectomy Bra (Quantity: 2) Dx: 174.9; Left Mastectomy      . valsartan-hydrochlorothiazide (DIOVAN-HCT) 80-12.5 MG per tablet Take 1 tablet by mouth every morning.       No current facility-administered medications for this visit.    SURGICAL HISTORY:  Past Surgical History  Procedure Laterality Date  . Percutaneous coronary stent intervention (pci-s)    . Abdominal hysterectomy    . Nephrectomy living donor Left 2000  . Knee arthroscopy      knee  . Mastectomy modified radical Left 11/12/2013    Procedure: MASTECTOMY MODIFIED RADICAL;  Surgeon: Rolm Bookbinder, MD;  Location: [facility];  Service: General;  Laterality: Left;  . Portacath placement Right 12/16/2013    Procedure: INSERTION PORT-A-CATH;  Surgeon: Rolm Bookbinder, MD;  Location: [facility];  Service: General;  Laterality: Right;    REVIEW OF SYSTEMS:  Pertinent items are noted in HPI.   HEALTH MAINTENANCE:    PHYSICAL EXAMINATION: Blood pressure 101/63, pulse 92, temperature 97.6 F (36.4 C), temperature source Oral, resp. rate 18, height 5' 8"  (1.727 m), weight 189 lb 6.4 oz (85.911 kg). Body mass index is 28.8 kg/(m^2). ECOG PERFORMANCE STATUS: 0 - Asymptomatic   General appearance: alert, cooperative and appears stated age Lymph nodes: Cervical, supraclavicular, and axillary nodes normal. Resp: clear to auscultation bilaterally Back: symmetric, no curvature. ROM normal. No CVA tenderness. Cardio: regular rate and rhythm GI: soft, non-tender; bowel sounds normal; no masses,  no organomegaly Extremities: extremities normal,  atraumatic, no cyanosis or edema Neurologic: Grossly normal Left mastectomy site: looks healed, no evidence of infection or inflammation Breasts: right breast normal without mass, skin or nipple changes or axillary nodes.    LABORATORY DATA: Lab Results  Component Value Date   WBC 15.3* 01/28/2014   HGB 12.4 01/28/2014   HCT 37.7 01/28/2014   MCV 90.4 01/28/2014   PLT 326 01/28/2014      Chemistry      Component Value Date/Time   NA 142 01/28/2014 1211   NA 143 12/10/2013 1150   K 4.1 01/28/2014 1211   K 3.7 12/10/2013 1150   CL 102 12/10/2013 1150   CO2 24 01/28/2014 1211   CO2 28 12/10/2013  1150   BUN 23.2 01/28/2014 1211   BUN 23 12/10/2013 1150   CREATININE 0.9 01/28/2014 1211   CREATININE 0.99 12/10/2013 1150      Component Value Date/Time   CALCIUM 10.2 01/28/2014 1211   CALCIUM 9.6 12/10/2013 1150   ALKPHOS 75 01/28/2014 1211   ALKPHOS 60 12/10/2013 1150   AST 22 01/28/2014 1211   AST 26 12/10/2013 1150   ALT 34 01/28/2014 1211   ALT 39* 12/10/2013 1150   BILITOT 0.38 01/28/2014 1211   BILITOT 0.3 12/10/2013 1150       RADIOGRAPHIC STUDIES:  No results found.  ASSESSMENT/PLAN: 73 year old female with  #1 stage II invasive ductal carcinoma node positive status post left mastectomy on 11/12/2013. Tumor was ER positive, node positive.  #2 patient had PET/CT performed which revealed no evidence of distant metastatic disease.  #3 Here for  cycle 3 day 1 of Taxotere Cytoxan overall tolerated well. Blood counts look terrific. She will proceed with her scheduled treatment. She will return in 1 day for neulasta and IVF.  #4 Dehydration: patient does tend to get dehydrated so we will plan on giving her IVF pre-emptively to prevent this and keep the patient on schedule.  #5 follow up: patient will be seen back in 1 weeks' time for follow up and blood work and chemotherapy toxicity check as well as IVF if needed.   All questions were answered. The patient knows to call the clinic with any problems,  questions or concerns. We can certainly see the patient much sooner if necessary.  I spent 20 minutes counseling the patient face to face. The total time spent in the appointment was 34 minutes   Marcy Panning, MD Medical/Oncology [facility] [phone] (beeper) [phone] (Office)

## 2014-02-02 ENCOUNTER — Telehealth: Payer: Self-pay | Admitting: *Deleted

## 2014-02-02 ENCOUNTER — Ambulatory Visit (HOSPITAL_BASED_OUTPATIENT_CLINIC_OR_DEPARTMENT_OTHER): Payer: 59

## 2014-02-02 ENCOUNTER — Telehealth: Payer: Self-pay

## 2014-02-02 ENCOUNTER — Ambulatory Visit (HOSPITAL_BASED_OUTPATIENT_CLINIC_OR_DEPARTMENT_OTHER): Payer: 59 | Admitting: Adult Health

## 2014-02-02 ENCOUNTER — Encounter: Payer: Self-pay | Admitting: Adult Health

## 2014-02-02 VITALS — BP 91/51 | HR 88 | Temp 97.7°F | Resp 18 | Ht 68.0 in | Wt 184.4 lb

## 2014-02-02 DIAGNOSIS — C50412 Malignant neoplasm of upper-outer quadrant of left female breast: Secondary | ICD-10-CM

## 2014-02-02 DIAGNOSIS — L0390 Cellulitis, unspecified: Secondary | ICD-10-CM

## 2014-02-02 DIAGNOSIS — I959 Hypotension, unspecified: Secondary | ICD-10-CM

## 2014-02-02 DIAGNOSIS — C50419 Malignant neoplasm of upper-outer quadrant of unspecified female breast: Secondary | ICD-10-CM

## 2014-02-02 DIAGNOSIS — L988 Other specified disorders of the skin and subcutaneous tissue: Secondary | ICD-10-CM

## 2014-02-02 DIAGNOSIS — E860 Dehydration: Secondary | ICD-10-CM

## 2014-02-02 DIAGNOSIS — Z170 Estrogen receptor positive status [ER+]: Secondary | ICD-10-CM

## 2014-02-02 LAB — CBC WITH DIFFERENTIAL/PLATELET
BASO%: 0.7 % (ref 0.0–2.0)
Basophils Absolute: 0 10*3/uL (ref 0.0–0.1)
EOS%: 3.4 % (ref 0.0–7.0)
Eosinophils Absolute: 0.2 10*3/uL (ref 0.0–0.5)
HCT: 37.4 % (ref 34.8–46.6)
HGB: 12.5 g/dL (ref 11.6–15.9)
LYMPH%: 14.6 % (ref 14.0–49.7)
MCH: 29.9 pg (ref 25.1–34.0)
MCHC: 33.4 g/dL (ref 31.5–36.0)
MCV: 89.5 fL (ref 79.5–101.0)
MONO#: 0.2 10*3/uL (ref 0.1–0.9)
MONO%: 3.1 % (ref 0.0–14.0)
NEUT#: 4 10*3/uL (ref 1.5–6.5)
NEUT%: 78.2 % — ABNORMAL HIGH (ref 38.4–76.8)
Platelets: 184 10*3/uL (ref 145–400)
RBC: 4.18 10*6/uL (ref 3.70–5.45)
RDW: 15.7 % — ABNORMAL HIGH (ref 11.2–14.5)
WBC: 5.1 10*3/uL (ref 3.9–10.3)
lymph#: 0.8 10*3/uL — ABNORMAL LOW (ref 0.9–3.3)

## 2014-02-02 LAB — COMPREHENSIVE METABOLIC PANEL (CC13)
ALT: 33 U/L (ref 0–55)
AST: 20 U/L (ref 5–34)
Albumin: 3.6 g/dL (ref 3.5–5.0)
Alkaline Phosphatase: 98 U/L (ref 40–150)
Anion Gap: 9 mEq/L (ref 3–11)
BUN: 37.7 mg/dL — ABNORMAL HIGH (ref 7.0–26.0)
CO2: 26 mEq/L (ref 22–29)
Calcium: 9.6 mg/dL (ref 8.4–10.4)
Chloride: 103 mEq/L (ref 98–109)
Creatinine: 1 mg/dL (ref 0.6–1.1)
Glucose: 98 mg/dl (ref 70–140)
Potassium: 4.2 mEq/L (ref 3.5–5.1)
Sodium: 138 mEq/L (ref 136–145)
Total Bilirubin: 0.93 mg/dL (ref 0.20–1.20)
Total Protein: 6.1 g/dL — ABNORMAL LOW (ref 6.4–8.3)

## 2014-02-02 MED ORDER — SODIUM CHLORIDE 0.9 % IV SOLN
INTRAVENOUS | Status: DC
  Administered 2014-02-02: 14:00:00 via INTRAVENOUS

## 2014-02-02 MED ORDER — CEPHALEXIN 500 MG PO CAPS: 500.0000 mg | ORAL_CAPSULE | Freq: Three times a day (TID) | ORAL | Status: DC

## 2014-02-02 NOTE — Patient Instructions (Signed)
Dehydration, Adult  Dehydration means your body does not have as much fluid as it needs. Your kidneys, brain, and heart will not work properly without the right amount of fluids and salt.   HOME CARE   Ask your doctor how to replace body fluid losses (rehydrate).   Drink enough fluids to keep your pee (urine) clear or pale yellow.   Drink small amounts of fluids often if you feel sick to your stomach (nauseous) or throw up (vomit).   Eat like you normally do.   Avoid:   Foods or drinks high in sugar.   Bubbly (carbonated) drinks.   Juice.   Very hot or cold fluids.   Drinks with caffeine.   Fatty, greasy foods.   Alcohol.   Tobacco.   Eating too much.   Gelatin desserts.   Wash your hands to avoid spreading germs (bacteria, viruses).   Only take medicine as told by your doctor.   Keep all doctor visits as told.  GET HELP RIGHT AWAY IF:    You cannot drink something without throwing up.   You get worse even with treatment.   Your vomit has blood in it or looks greenish.   Your poop (stool) has blood in it or looks black and tarry.   You have not peed in 6 to 8 hours.   You pee a small amount of very dark pee.   You have a fever.   You pass out (faint).   You have belly (abdominal) pain that gets worse or stays in one spot (localizes).   You have a rash, stiff neck, or bad headache.   You get easily annoyed, sleepy, or are hard to wake up.   You feel weak, dizzy, or very thirsty.  MAKE SURE YOU:    Understand these instructions.   Will watch your condition.   Will get help right away if you are not doing well or get worse.  Document Released: 09/07/2009 Document Revised: 02/03/2012 Document Reviewed: 07/01/2011  [employer] Patient Information 2014 [employer].

## 2014-02-02 NOTE — Telephone Encounter (Signed)
appts made and printed...td 

## 2014-02-02 NOTE — Telephone Encounter (Signed)
Pt reports inner upper left arm is red and splotchy.  No swelling or pain.  Per LC - pt needs to be seen today.  POF sent.  Pt notified.

## 2014-02-02 NOTE — Progress Notes (Signed)
OFFICE PROGRESS NOTE  CC**  Stephens Shire, MD [address] [name] [address] Dr. Ty Hilts Dr. Thea Silversmith  DIAGNOSIS:  73 year old female with diagnosis of invasive ductal carcinoma of the left breast found on self examination  Breast cancer of upper-outer quadrant of left female breast   Primary site: Breast   Staging method: AJCC 7th Edition   Clinical: Stage IIB (T2, N1, cM0) signed by Thea Silversmith, MD on 11/04/2013 11:07 AM   Summary: Stage IIB (T2, N1, cM0)  PRIOR THERAPY: 1. palpated a mass in her left breast. Because of this she had a diagnostic mammogram and ultrasound performed on 10/27/2013. This showed a spiculated mass in the 6:00 position measuring approximately 3 cm. Ultrasound confirmed the hypoechoic mass at the 6:00 position measuring 2.6 x 2.1 x 1.5 cm. There was also noted to be a second mass in the 12:00 position measuring 2.1 x 1.8 x 2.1 cm. 2 abnormal lymph nodes were noted in the left axilla largest one measuring 1.2 cm. Patient had a biopsy of the primary breast masses. She also had a biopsy of the lymph node. All 3 biopsies were positive for invasive ductal carcinoma. Tumor was ER 100% PR 100% positive HER-2/neu negative with a proliferation marker Ki-67 low at 14% and low  #2 patient is status post left mastectomy  Performed on 11/12/2013. The final pathology revealed: Breast, modified radical mastectomy , left - INVASIVE DUCTAL CARCINOMA. - DUCTAL CARCINOMA IN SITU. - MARGINS NOT INVOLVED. 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 - METASTATIC CARCINOMA IN 5 OF 10 LYMPH NODES (5/10).  #3 adjuvant chemotherapy consisting of Taxotere Cytoxan every 3 weeks for a total of 6 cycles curative intent beginning 12/17/2013  CURRENT THERAPY:   Taxotere Cytoxan cycle 3 day 6 on 01/28/14  INTERVAL HISTORY: CAMYRA VAETH 73 y.o. female returns for an urgent visit today.  She received her third cycles of taxotere/cytoxan last  week.  She called today and reported red splotches on her left arm, the side of her surgery and axillary node dissection.  This started this morning.  Her arm is slightly swollen, red, and she denies pain, in the area or itching.  She denies fevers, chills, dizziness, chest pain, palpitations, nausea, vomiting diarrhea, or any other concerns.  A 10 point ROS is otherwise negative.    MEDICAL HISTORY: Past Medical History  Diagnosis Date  . Hypertension   . CAD (coronary artery disease)   . Hyperlipidemia   . Peripheral vascular disease     atherosclerosis carotid artery-mild  . Breast cancer     left breast    ALLERGIES:  has No Known Allergies.  MEDICATIONS:  Current Outpatient Prescriptions  Medication Sig Dispense Refill  . aspirin EC 81 MG tablet Take 81 mg by mouth daily.      [name] atorvastatin (LIPITOR) 40 MG tablet Take 20 mg by mouth at bedtime.      . Calcium Carb-Cholecalciferol (CALCIUM 600 + D PO) Take 1 tablet by mouth daily.      [name] lidocaine-prilocaine (EMLA) cream Apply topically as needed (port).      . Multiple Vitamin (MULTIVITAMIN WITH MINERALS) TABS tablet Take 1 tablet by mouth daily.      . niacin (NIASPAN) 500 MG CR tablet Take 500 mg by mouth every other day. At bedtime      . omega-3 acid ethyl esters (LOVAZA) 1 G capsule Take 1 g by mouth daily.      [name]  Potassium 99 MG TABS Take 1 tablet by mouth daily.      [name] UNABLE TO FIND Rx: L8000- Mastectomy Bra (Quantity: 2) Dx: 174.9; Left Mastectomy      . valsartan-hydrochlorothiazide (DIOVAN-HCT) 80-12.5 MG per tablet Take 1 tablet by mouth every morning.      [name] dexamethasone (DECADRON) 4 MG tablet Take 8 mg by mouth 2 (two) times daily with a meal. Take two times a day the day before Taxotere. Then take two times a day starting the day after chemo for 3 days.      [name] LORazepam (ATIVAN) 0.5 MG tablet Take 0.5 mg by mouth every 6 (six) hours as needed (Nausea or vomiting).      [name] NITROSTAT 0.4 MG SL tablet       .  ondansetron (ZOFRAN) 8 MG tablet Take 8 mg by mouth 2 (two) times daily. Take two times a day starting the day after chemo for 3 days. Then take two times a day as needed for nausea or vomiting.      [name] oxyCODONE (OXY IR/ROXICODONE) 5 MG immediate release tablet       . oxyCODONE-acetaminophen (PERCOCET) 10-325 MG per tablet Take 1 tablet by mouth every 6 (six) hours as needed for pain.  20 tablet  0  . prochlorperazine (COMPAZINE) 10 MG tablet Take 10 mg by mouth every 6 (six) hours as needed for nausea or vomiting.       No current facility-administered medications for this visit.    SURGICAL HISTORY:  Past Surgical History  Procedure Laterality Date  . Percutaneous coronary stent intervention (pci-s)    . Abdominal hysterectomy    . Nephrectomy living donor Left 2000  . Knee arthroscopy      knee  . Mastectomy modified radical Left 11/12/2013    Procedure: MASTECTOMY MODIFIED RADICAL;  Surgeon: Rolm Bookbinder, MD;  Location: [facility];  Service: General;  Laterality: Left;  . Portacath placement Right 12/16/2013    Procedure: INSERTION PORT-A-CATH;  Surgeon: Rolm Bookbinder, MD;  Location: [facility];  Service: General;  Laterality: Right;    REVIEW OF SYSTEMS:  A 10 point review of systems was conducted and is otherwise negative except for what is noted above.     PHYSICAL EXAMINATION: Blood pressure 91/51, pulse 88, temperature 97.7 F (36.5 C), temperature source Oral, resp. rate 18, height _0  (1.727 m), weight 184 lb 6.4 oz (83.643 kg). Body mass index is 28.04 kg/(m^2). Rechecked BP at 90/50 GENERAL: Patient is a well appearing female in no acute distress HEENT:  Sclerae anicteric.  Oropharynx clear and moist. No ulcerations or evidence of oropharyngeal candidiasis. Neck is supple.  NODES:  No cervical, supraclavicular, or axillary lymphadenopathy palpated.  BREAST EXAM:  Deferred. LUNGS:  Clear to auscultation bilaterally.  No wheezes or rhonchi. HEART:  Regular rate and  rhythm. No murmur appreciated. ABDOMEN:  Soft, nontender.  Positive, normoactive bowel sounds. No organomegaly palpated. MSK:  No focal spinal tenderness to palpation. Full range of motion bilaterally in the upper extremities. EXTREMITIES:  No peripheral edema.   SKIN:  Left upper arm with confluent erythema, slight swelling and warmth on the interior.  No tenderness. No nail dyscrasia. NEURO:  Nonfocal. Well oriented.  Appropriate affect. ECOG PERFORMANCE STATUS: 0 - Asymptomatic       LABORATORY DATA: Lab Results  Component Value Date   WBC 15.3* 01/28/2014   HGB 12.4 01/28/2014   HCT 37.7 01/28/2014   MCV 90.4 01/28/2014  PLT 326 01/28/2014      Chemistry      Component Value Date/Time   NA 142 01/28/2014 1211   NA 143 12/10/2013 1150   K 4.1 01/28/2014 1211   K 3.7 12/10/2013 1150   CL 102 12/10/2013 1150   CO2 24 01/28/2014 1211   CO2 28 12/10/2013 1150   BUN 23.2 01/28/2014 1211   BUN 23 12/10/2013 1150   CREATININE 0.9 01/28/2014 1211   CREATININE 0.99 12/10/2013 1150      Component Value Date/Time   CALCIUM 10.2 01/28/2014 1211   CALCIUM 9.6 12/10/2013 1150   ALKPHOS 75 01/28/2014 1211   ALKPHOS 60 12/10/2013 1150   AST 22 01/28/2014 1211   AST 26 12/10/2013 1150   ALT 34 01/28/2014 1211   ALT 39* 12/10/2013 1150   BILITOT 0.38 01/28/2014 1211   BILITOT 0.3 12/10/2013 1150       RADIOGRAPHIC STUDIES:  No results found.  ASSESSMENT/PLAN: 73 year old female with  #1 stage II invasive ductal carcinoma node positive status post left mastectomy on 11/12/2013. Tumor was ER positive, node positive.  #2 patient had PET/CT performed which revealed no evidence of distant metastatic disease.  #3 Patient has likely cellulitis of the left upper arm.  I prescribed Keflex TID for the patient to take x 10 days.  I gave her detailed information about this medication in her AVS.  I also reviewed additional reasons for her to call/contact us.  I added on a CBC and CMP to today's visit.  This is pending.    #4  Due to her slightly decreased BP she will receive IV fluids today.  She denies dizziness.  I encouraged her to increase her PO intake.    #5  She will return on Friday, 02/04/14 for f/u with Dr. Humphrey Rolls and IV fluids as well.     All questions were answered. The patient knows to call the clinic with any problems, questions or concerns. We can certainly see the patient much sooner if necessary.  I spent 25 minutes counseling the patient face to face. The total time spent in the appointment was 30 minutes  Minette Headland, [place] [phone]

## 2014-02-02 NOTE — Patient Instructions (Signed)
Cephalexin tablets or capsules What is this medicine? CEPHALEXIN (sef a LEX in) is a cephalosporin antibiotic. It is used to treat certain kinds of bacterial infections It will not work for colds, flu, or other viral infections. This medicine may be used for other purposes; ask your health care provider or pharmacist if you have questions. COMMON BRAND NAME(S): Biocef, Keflex, Keftab What should I tell my health care provider before I take this medicine? They need to know if you have any of these conditions: -kidney disease -stomach or intestine problems, especially colitis -an unusual or allergic reaction to cephalexin, other cephalosporins, penicillins, other antibiotics, medicines, foods, dyes or preservatives -pregnant or trying to get pregnant -breast-feeding How should I use this medicine? Take this medicine by mouth with a full glass of water. Follow the directions on the prescription label. This medicine can be taken with or without food. Take your medicine at regular intervals. Do not take your medicine more often than directed. Take all of your medicine as directed even if you think you are better. Do not skip doses or stop your medicine early. Talk to your pediatrician regarding the use of this medicine in children. While this drug may be prescribed for selected conditions, precautions do apply. Overdosage: If you think you have taken too much of this medicine contact a poison control center or emergency room at once. NOTE: This medicine is only for you. Do not share this medicine with others. What if I miss a dose? If you miss a dose, take it as soon as you can. If it is almost time for your next dose, take only that dose. Do not take double or extra doses. There should be at least 4 to 6 hours between doses. What may interact with this medicine? -probenecid -some other antibiotics This list may not describe all possible interactions. Give your health care provider a list of all the  medicines, herbs, non-prescription drugs, or dietary supplements you use. Also tell them if you smoke, drink alcohol, or use illegal drugs. Some items may interact with your medicine. What should I watch for while using this medicine? Tell your doctor or health care professional if your symptoms do not begin to improve in a few days. Do not treat diarrhea with over the counter products. Contact your doctor if you have diarrhea that lasts more than 2 days or if it is severe and watery. If you have diabetes, you may get a false-positive result for sugar in your urine. Check with your doctor or health care professional. What side effects may I notice from receiving this medicine? Side effects that you should report to your doctor or health care professional as soon as possible: -allergic reactions like skin rash, itching or hives, swelling of the face, lips, or tongue -breathing problems -pain or trouble passing urine -redness, blistering, peeling or loosening of the skin, including inside the mouth -severe or watery diarrhea -unusually weak or tired -yellowing of the eyes, skin Side effects that usually do not require medical attention (report to your doctor or health care professional if they continue or are bothersome): -gas or heartburn -genital or anal irritation -headache -joint or muscle pain -nausea, vomiting This list may not describe all possible side effects. Call your doctor for medical advice about side effects. You may report side effects to FDA at 1-800-FDA-1088. Where should I keep my medicine? Keep out of the reach of children. Store at room temperature between 59 and 86 degrees F (15 and 30  degrees C). Throw away any unused medicine after the expiration date. NOTE: This sheet is a summary. It may not cover all possible information. If you have questions about this medicine, talk to your doctor, pharmacist, or health care provider.  2014, [employer]. (2008-02-15  17:09:13)

## 2014-02-04 ENCOUNTER — Other Ambulatory Visit (HOSPITAL_BASED_OUTPATIENT_CLINIC_OR_DEPARTMENT_OTHER): Payer: Medicare Other

## 2014-02-04 ENCOUNTER — Ambulatory Visit (HOSPITAL_BASED_OUTPATIENT_CLINIC_OR_DEPARTMENT_OTHER): Payer: 59 | Admitting: Oncology

## 2014-02-04 ENCOUNTER — Encounter: Payer: Self-pay | Admitting: Oncology

## 2014-02-04 ENCOUNTER — Ambulatory Visit (HOSPITAL_BASED_OUTPATIENT_CLINIC_OR_DEPARTMENT_OTHER): Payer: 59

## 2014-02-04 VITALS — BP 95/56 | HR 101 | Temp 97.6°F | Resp 20 | Ht 68.0 in | Wt 184.9 lb

## 2014-02-04 VITALS — BP 102/40 | HR 92 | Resp 18

## 2014-02-04 DIAGNOSIS — I959 Hypotension, unspecified: Secondary | ICD-10-CM

## 2014-02-04 DIAGNOSIS — C50419 Malignant neoplasm of upper-outer quadrant of unspecified female breast: Secondary | ICD-10-CM

## 2014-02-04 DIAGNOSIS — R5381 Other malaise: Secondary | ICD-10-CM

## 2014-02-04 DIAGNOSIS — C50412 Malignant neoplasm of upper-outer quadrant of left female breast: Secondary | ICD-10-CM

## 2014-02-04 DIAGNOSIS — C50919 Malignant neoplasm of unspecified site of unspecified female breast: Secondary | ICD-10-CM

## 2014-02-04 DIAGNOSIS — R5383 Other fatigue: Secondary | ICD-10-CM

## 2014-02-04 DIAGNOSIS — E860 Dehydration: Secondary | ICD-10-CM

## 2014-02-04 DIAGNOSIS — R112 Nausea with vomiting, unspecified: Secondary | ICD-10-CM

## 2014-02-04 DIAGNOSIS — N972 Female infertility of uterine origin: Secondary | ICD-10-CM

## 2014-02-04 DIAGNOSIS — Z170 Estrogen receptor positive status [ER+]: Secondary | ICD-10-CM

## 2014-02-04 DIAGNOSIS — C773 Secondary and unspecified malignant neoplasm of axilla and upper limb lymph nodes: Secondary | ICD-10-CM

## 2014-02-04 LAB — CBC WITH DIFFERENTIAL/PLATELET
BASO%: 0.6 % (ref 0.0–2.0)
Basophils Absolute: 0 10*3/uL (ref 0.0–0.1)
EOS%: 1.1 % (ref 0.0–7.0)
Eosinophils Absolute: 0.1 10*3/uL (ref 0.0–0.5)
HCT: 36.1 % (ref 34.8–46.6)
HGB: 12 g/dL (ref 11.6–15.9)
LYMPH%: 20.7 % (ref 14.0–49.7)
MCH: 29.7 pg (ref 25.1–34.0)
MCHC: 33.2 g/dL (ref 31.5–36.0)
MCV: 89.4 fL (ref 79.5–101.0)
MONO#: 1.6 10*3/uL — ABNORMAL HIGH (ref 0.1–0.9)
MONO%: 29.5 % — ABNORMAL HIGH (ref 0.0–14.0)
NEUT#: 2.6 10*3/uL (ref 1.5–6.5)
NEUT%: 48.1 % (ref 38.4–76.8)
Platelets: 196 10*3/uL (ref 145–400)
RBC: 4.04 10*6/uL (ref 3.70–5.45)
RDW: 15.9 % — ABNORMAL HIGH (ref 11.2–14.5)
WBC: 5.4 10*3/uL (ref 3.9–10.3)
lymph#: 1.1 10*3/uL (ref 0.9–3.3)
nRBC: 1 % — ABNORMAL HIGH (ref 0–0)

## 2014-02-04 LAB — COMPREHENSIVE METABOLIC PANEL (CC13)
ALT: 28 U/L (ref 0–55)
AST: 18 U/L (ref 5–34)
Albumin: 3.4 g/dL — ABNORMAL LOW (ref 3.5–5.0)
Alkaline Phosphatase: 80 U/L (ref 40–150)
Anion Gap: 12 mEq/L — ABNORMAL HIGH (ref 3–11)
BUN: 20.7 mg/dL (ref 7.0–26.0)
CO2: 25 mEq/L (ref 22–29)
Calcium: 9.1 mg/dL (ref 8.4–10.4)
Chloride: 103 mEq/L (ref 98–109)
Creatinine: 1 mg/dL (ref 0.6–1.1)
Glucose: 125 mg/dl (ref 70–140)
Potassium: 3.8 mEq/L (ref 3.5–5.1)
Sodium: 140 mEq/L (ref 136–145)
Total Bilirubin: 0.52 mg/dL (ref 0.20–1.20)
Total Protein: 5.9 g/dL — ABNORMAL LOW (ref 6.4–8.3)

## 2014-02-04 MED ORDER — SODIUM CHLORIDE 0.9 % IJ SOLN
10.0000 mL | Freq: Once | INTRAMUSCULAR | Status: AC
  Administered 2014-02-04: 10 mL via INTRAVENOUS
  Filled 2014-02-04: qty 10

## 2014-02-04 MED ORDER — SODIUM CHLORIDE 0.9 % IV SOLN
INTRAVENOUS | Status: DC
  Administered 2014-02-04: 13:00:00 via INTRAVENOUS

## 2014-02-04 MED ORDER — HEPARIN SOD (PORK) LOCK FLUSH 100 UNIT/ML IV SOLN
500.0000 [IU] | Freq: Once | INTRAVENOUS | Status: AC
  Administered 2014-02-04: 500 [IU] via INTRAVENOUS
  Filled 2014-02-04: qty 5

## 2014-02-04 NOTE — Progress Notes (Signed)
1 L NS complete, BP 102/40, patient states she is feeling better and denies lightheadedness or dizziness. She drank 2 cups po fluids while receiving IVF today. Patient instructed to continue to drink po fluids and to call clinic if unable to drink or if she notices changes in mental status. Pt verbalizes understanding.

## 2014-02-04 NOTE — Patient Instructions (Signed)
Hypotension As your heart beats, it forces blood through your arteries. This force is your blood pressure. If your blood pressure is too low for you to go about your normal activities or to support the organs of your body, you have hypotension. Hypotension is also referred to as low blood pressure. When your blood pressure becomes too low, you may not get enough blood to your brain. As a result, you may feel weak, feel lightheaded, or develop a rapid heart rate. In a more severe case, you may faint. CAUSES Various conditions can cause hypotension. These include:  Blood loss.  Dehydration.  Heart or endocrine problems.  Pregnancy.  Severe infection.  Not having a well-balanced diet filled with needed nutrients.  Severe allergic reactions (anaphylaxis). Some medicines, such as blood pressure medicine or water pills (diuretics), may lower your blood pressure below normal. Sometimes taking too much medicine or taking medicine not as directed can cause hypotension. TREATMENT  Hospitalization is sometimes required for hypotension if fluid or blood replacement is needed, if time is needed for medicines to wear off, or if further monitoring is needed. Treatment might include changing your diet, changing your medicines (including medicines aimed at raising your blood pressure), and use of support stockings. HOME CARE INSTRUCTIONS   Drink enough fluids to keep your urine clear or pale yellow.  Take your medicines as directed by your health care provider.  Get up slowly from reclining or sitting positions. This gives your blood pressure a chance to adjust.  Wear support stockings as directed by your health care provider.  Maintain a healthy diet by including nutritious food, such as fruits, vegetables, nuts, whole grains, and lean meats. SEEK MEDICAL CARE IF:  You have vomiting or diarrhea.  You have a fever for more than 2 3 days.  You feel more thirsty than usual.  You feel weak and  tired. SEEK IMMEDIATE MEDICAL CARE IF:   You have chest pain or a fast or irregular heartbeat.  You have a loss of feeling in some part of your body, or you lose movement in your arms or legs.  You have trouble speaking.  You become sweaty or feel lightheaded.  You faint. MAKE SURE YOU:   Understand these instructions.  Will watch your condition.  Will get help right away if you are not doing well or get worse. Document Released: 11/11/2005 Document Revised: 09/01/2013 Document Reviewed: 05/14/2013 [facility] Patient Information 2014 [place].

## 2014-02-09 NOTE — Progress Notes (Signed)
OFFICE PROGRESS NOTE  CC**  Stephens Shire, MD [address] [name] [address] Dr. Ty Hilts Dr. Thea Silversmith  DIAGNOSIS:  73 year old female with diagnosis of invasive ductal carcinoma of the left breast found on self examination  Breast cancer of upper-outer quadrant of left female breast   Primary site: Breast   Staging method: AJCC 7th Edition   Clinical: Stage IIB (T2, N1, cM0) signed by Thea Silversmith, MD on 11/04/2013 11:07 AM   Summary: Stage IIB (T2, N1, cM0)  PRIOR THERAPY: 1. palpated a mass in her left breast. Because of this she had a diagnostic mammogram and ultrasound performed on 10/27/2013. This showed a spiculated mass in the 6:00 position measuring approximately 3 cm. Ultrasound confirmed the hypoechoic mass at the 6:00 position measuring 2.6 x 2.1 x 1.5 cm. There was also noted to be a second mass in the 12:00 position measuring 2.1 x 1.8 x 2.1 cm. 2 abnormal lymph nodes were noted in the left axilla largest one measuring 1.2 cm. Patient had a biopsy of the primary breast masses. She also had a biopsy of the lymph node. All 3 biopsies were positive for invasive ductal carcinoma. Tumor was ER 100% PR 100% positive HER-2/neu negative with a proliferation marker Ki-67 low at 14% and low  #2 patient is status post left mastectomy  Performed on 11/12/2013. The final pathology revealed: Breast, modified radical mastectomy , left - INVASIVE DUCTAL CARCINOMA. - DUCTAL CARCINOMA IN SITU. - MARGINS NOT INVOLVED. 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 - METASTATIC CARCINOMA IN 5 OF 10 LYMPH NODES (5/10).  #3 adjuvant chemotherapy consisting of Taxotere Cytoxan every 3 weeks for a total of 6 cycles curative intent beginning 12/17/2013  CURRENT THERAPY:   Status post Taxotere Cytoxan cycle 3 day  on 01/28/14  INTERVAL HISTORY: Wendy [name] 73 y.o. female returns for followup today. Today is day 8 post cycle 3 of chemotherapy. She tells  me that she has not done well at all. She had significant nausea vomiting and diarrhea off and on. But the most significant was the nausea that she experienced with cycle 3 of chemotherapy. She could hardly get to do anything. She is not working. She is feeling fatigued. I have recommended that we give her IV fluids. I do think that this is gong to continue for quite some time. She is having a little bit of referral paresthesias but is not incapacitated with this. She has no shortness of breath or chest pains. She does get aches and pains with the Neulasta injection. Her right upper extremity cellulitis is improving she is on Keflex still. Remainder of the 10 point review of systems is negative  MEDICAL HISTORY: Past Medical History  Diagnosis Date  . Hypertension   . CAD (coronary artery disease)   . Hyperlipidemia   . Peripheral vascular disease     atherosclerosis carotid artery-mild  . Breast cancer     left breast    ALLERGIES:  has No Known Allergies.  MEDICATIONS:  Current Outpatient Prescriptions  Medication Sig Dispense Refill  . aspirin EC 81 MG tablet Take 81 mg by mouth daily.      [name] atorvastatin (LIPITOR) 40 MG tablet Take 20 mg by mouth at bedtime.      . Calcium Carb-Cholecalciferol (CALCIUM 600 + D PO) Take 1 tablet by mouth daily.      . cephALEXin (KEFLEX) 500 MG capsule Take 1 capsule (500 mg total) by mouth 3 (  three) times daily.  30 capsule  0  . dexamethasone (DECADRON) 4 MG tablet Take 8 mg by mouth 2 (two) times daily with a meal. Take two times a day the day before Taxotere. Then take two times a day starting the day after chemo for 3 days.      [name] lidocaine-prilocaine (EMLA) cream Apply topically as needed (port).      . LORazepam (ATIVAN) 0.5 MG tablet Take 0.5 mg by mouth every 6 (six) hours as needed (Nausea or vomiting).      . Multiple Vitamin (MULTIVITAMIN WITH MINERALS) TABS tablet Take 1 tablet by mouth daily.      . niacin (NIASPAN) 500 MG CR tablet Take  500 mg by mouth every other day. At bedtime      . NITROSTAT 0.4 MG SL tablet       . omega-3 acid ethyl esters (LOVAZA) 1 G capsule Take 1 g by mouth daily.      . ondansetron (ZOFRAN) 8 MG tablet Take 8 mg by mouth 2 (two) times daily. Take two times a day starting the day after chemo for 3 days. Then take two times a day as needed for nausea or vomiting.      [name] oxyCODONE (OXY IR/ROXICODONE) 5 MG immediate release tablet       . oxyCODONE-acetaminophen (PERCOCET) 10-325 MG per tablet Take 1 tablet by mouth every 6 (six) hours as needed for pain.  20 tablet  0  . Potassium 99 MG TABS Take 1 tablet by mouth daily.      . prochlorperazine (COMPAZINE) 10 MG tablet Take 10 mg by mouth every 6 (six) hours as needed for nausea or vomiting.      [name] UNABLE TO FIND Rx: L8000- Mastectomy Bra (Quantity: 2) Dx: 174.9; Left Mastectomy      . valsartan-hydrochlorothiazide (DIOVAN-HCT) 80-12.5 MG per tablet Take 1 tablet by mouth every morning.       No current facility-administered medications for this visit.    SURGICAL HISTORY:  Past Surgical History  Procedure Laterality Date  . Percutaneous coronary stent intervention (pci-s)    . Abdominal hysterectomy    . Nephrectomy living donor Left 2000  . Knee arthroscopy      knee  . Mastectomy modified radical Left 11/12/2013    Procedure: MASTECTOMY MODIFIED RADICAL;  Surgeon: Rolm Bookbinder, MD;  Location: [facility];  Service: General;  Laterality: Left;  . Portacath placement Right 12/16/2013    Procedure: INSERTION PORT-A-CATH;  Surgeon: Rolm Bookbinder, MD;  Location: [facility];  Service: General;  Laterality: Right;    REVIEW OF SYSTEMS:  A 10 point review of systems was conducted and is otherwise negative except for what is noted above.     PHYSICAL EXAMINATION: Blood pressure 95/56, pulse 101, temperature 97.6 F (36.4 C), temperature source Oral, resp. rate 20, height 5' 8" (1.727 m), weight 184 lb 14.4 oz (83.87 kg). Body mass index is 28.12  kg/(m^2). Rechecked BP at 90/50 GENERAL: Patient is a well appearing female in no acute distress HEENT:  Sclerae anicteric.  Oropharynx clear and moist. No ulcerations or evidence of oropharyngeal candidiasis. Neck is supple.  NODES:  No cervical, supraclavicular, or axillary lymphadenopathy palpated.  BREAST EXAM:  Deferred. LUNGS:  Clear to auscultation bilaterally.  No wheezes or rhonchi. HEART:  Regular rate and rhythm. No murmur appreciated. ABDOMEN:  Soft, nontender.  Positive, normoactive bowel sounds. No organomegaly palpated. MSK:  No focal spinal tenderness to palpation. Full range  of motion bilaterally in the upper extremities. EXTREMITIES:  No peripheral edema.   SKIN:  Left upper arm with confluent erythema, slight swelling and warmth on the interior.  No tenderness. No nail dyscrasia. NEURO:  Nonfocal. Well oriented.  Appropriate affect. ECOG PERFORMANCE STATUS: 0 - Asymptomatic       LABORATORY DATA: Lab Results  Component Value Date   WBC 5.4 02/04/2014   HGB 12.0 02/04/2014   HCT 36.1 02/04/2014   MCV 89.4 02/04/2014   PLT 196 02/04/2014      Chemistry      Component Value Date/Time   NA 140 02/04/2014 1107   NA 143 12/10/2013 1150   K 3.8 02/04/2014 1107   K 3.7 12/10/2013 1150   CL 102 12/10/2013 1150   CO2 25 02/04/2014 1107   CO2 28 12/10/2013 1150   BUN 20.7 02/04/2014 1107   BUN 23 12/10/2013 1150   CREATININE 1.0 02/04/2014 1107   CREATININE 0.99 12/10/2013 1150      Component Value Date/Time   CALCIUM 9.1 02/04/2014 1107   CALCIUM 9.6 12/10/2013 1150   ALKPHOS 80 02/04/2014 1107   ALKPHOS 60 12/10/2013 1150   AST 18 02/04/2014 1107   AST 26 12/10/2013 1150   ALT 28 02/04/2014 1107   ALT 39* 12/10/2013 1150   BILITOT 0.52 02/04/2014 1107   BILITOT 0.3 12/10/2013 1150       RADIOGRAPHIC STUDIES:  No results found.  ASSESSMENT/PLAN: 73 year old female with  #1 stage II invasive ductal carcinoma node positive status post left mastectomy on 11/12/2013. Tumor  was ER positive, node positive.  #2 patient had PET/CT performed which revealed no evidence of distant metastatic disease.  #3  cellulitis of the left upper arm:  She will continue the Keflex. Her arm is getting better.   #4  she will receive IV fluids today.  #5 she'll return on 02/18/2014 for cycle 4 of her chemotherapy. I do think that due to to significant nausea and vomiting she will need Aloxi and Emend. She will also possibly need to change her chemotherapy day to a Monday so that she can be aggressively hydrated throughout the week.  All questions were answered. The patient knows to call the clinic with any problems, questions or concerns. We can certainly see the patient much sooner if necessary.  I spent 20 minutes counseling the patient face to face. The total time spent in the appointment was 23 minutes  Marcy Panning, MD Medical/Oncology [facility] [phone] (beeper) [phone] (Office)

## 2014-02-18 ENCOUNTER — Ambulatory Visit (HOSPITAL_BASED_OUTPATIENT_CLINIC_OR_DEPARTMENT_OTHER): Payer: 59

## 2014-02-18 ENCOUNTER — Encounter: Payer: Self-pay | Admitting: Oncology

## 2014-02-18 ENCOUNTER — Ambulatory Visit (HOSPITAL_BASED_OUTPATIENT_CLINIC_OR_DEPARTMENT_OTHER): Payer: 59 | Admitting: Oncology

## 2014-02-18 ENCOUNTER — Other Ambulatory Visit (HOSPITAL_BASED_OUTPATIENT_CLINIC_OR_DEPARTMENT_OTHER): Payer: 59

## 2014-02-18 ENCOUNTER — Telehealth: Payer: Self-pay | Admitting: Oncology

## 2014-02-18 ENCOUNTER — Telehealth: Payer: Self-pay | Admitting: *Deleted

## 2014-02-18 VITALS — BP 123/67 | HR 92 | Temp 97.3°F | Resp 18 | Ht 68.0 in | Wt 190.4 lb

## 2014-02-18 DIAGNOSIS — C50412 Malignant neoplasm of upper-outer quadrant of left female breast: Secondary | ICD-10-CM

## 2014-02-18 DIAGNOSIS — G589 Mononeuropathy, unspecified: Secondary | ICD-10-CM

## 2014-02-18 DIAGNOSIS — C50919 Malignant neoplasm of unspecified site of unspecified female breast: Secondary | ICD-10-CM

## 2014-02-18 DIAGNOSIS — C773 Secondary and unspecified malignant neoplasm of axilla and upper limb lymph nodes: Secondary | ICD-10-CM

## 2014-02-18 DIAGNOSIS — E860 Dehydration: Secondary | ICD-10-CM

## 2014-02-18 DIAGNOSIS — Z5111 Encounter for antineoplastic chemotherapy: Secondary | ICD-10-CM

## 2014-02-18 DIAGNOSIS — Z170 Estrogen receptor positive status [ER+]: Secondary | ICD-10-CM

## 2014-02-18 LAB — CBC WITH DIFFERENTIAL/PLATELET
BASO%: 0.2 % (ref 0.0–2.0)
Basophils Absolute: 0 10*3/uL (ref 0.0–0.1)
EOS%: 0 % (ref 0.0–7.0)
Eosinophils Absolute: 0 10*3/uL (ref 0.0–0.5)
HCT: 36.3 % (ref 34.8–46.6)
HGB: 12 g/dL (ref 11.6–15.9)
LYMPH%: 5.7 % — ABNORMAL LOW (ref 14.0–49.7)
MCH: 29.9 pg (ref 25.1–34.0)
MCHC: 33 g/dL (ref 31.5–36.0)
MCV: 90.8 fL (ref 79.5–101.0)
MONO#: 0.8 10*3/uL (ref 0.1–0.9)
MONO%: 7.2 % (ref 0.0–14.0)
NEUT#: 9.1 10*3/uL — ABNORMAL HIGH (ref 1.5–6.5)
NEUT%: 86.9 % — ABNORMAL HIGH (ref 38.4–76.8)
Platelets: 240 10*3/uL (ref 145–400)
RBC: 4 10*6/uL (ref 3.70–5.45)
RDW: 17.3 % — ABNORMAL HIGH (ref 11.2–14.5)
WBC: 10.5 10*3/uL — ABNORMAL HIGH (ref 3.9–10.3)
lymph#: 0.6 10*3/uL — ABNORMAL LOW (ref 0.9–3.3)

## 2014-02-18 LAB — COMPREHENSIVE METABOLIC PANEL (CC13)
ALT: 39 U/L (ref 0–55)
AST: 23 U/L (ref 5–34)
Albumin: 3.7 g/dL (ref 3.5–5.0)
Alkaline Phosphatase: 76 U/L (ref 40–150)
Anion Gap: 16 mEq/L — ABNORMAL HIGH (ref 3–11)
BUN: 13.2 mg/dL (ref 7.0–26.0)
CO2: 19 mEq/L — ABNORMAL LOW (ref 22–29)
Calcium: 9.3 mg/dL (ref 8.4–10.4)
Chloride: 109 mEq/L (ref 98–109)
Creatinine: 0.9 mg/dL (ref 0.6–1.1)
Glucose: 172 mg/dl — ABNORMAL HIGH (ref 70–140)
Potassium: 3.8 mEq/L (ref 3.5–5.1)
Sodium: 144 mEq/L (ref 136–145)
Total Bilirubin: 0.48 mg/dL (ref 0.20–1.20)
Total Protein: 6.3 g/dL — ABNORMAL LOW (ref 6.4–8.3)

## 2014-02-18 MED ORDER — SODIUM CHLORIDE 0.9 % IV SOLN
75.0000 mg/m2 | Freq: Once | INTRAVENOUS | Status: AC
  Administered 2014-02-18: 160 mg via INTRAVENOUS
  Filled 2014-02-18: qty 16

## 2014-02-18 MED ORDER — SODIUM CHLORIDE 0.9 % IV SOLN
600.0000 mg/m2 | Freq: Once | INTRAVENOUS | Status: AC
  Administered 2014-02-18: 1240 mg via INTRAVENOUS
  Filled 2014-02-18: qty 62

## 2014-02-18 MED ORDER — DEXAMETHASONE SODIUM PHOSPHATE 20 MG/5ML IJ SOLN
INTRAMUSCULAR | Status: AC
  Filled 2014-02-18: qty 5

## 2014-02-18 MED ORDER — DEXAMETHASONE SODIUM PHOSPHATE 20 MG/5ML IJ SOLN
20.0000 mg | Freq: Once | INTRAMUSCULAR | Status: AC
  Administered 2014-02-18: 20 mg via INTRAVENOUS

## 2014-02-18 MED ORDER — SODIUM CHLORIDE 0.9 % IV SOLN
Freq: Once | INTRAVENOUS | Status: AC
  Administered 2014-02-18: 14:00:00 via INTRAVENOUS

## 2014-02-18 MED ORDER — SODIUM CHLORIDE 0.9 % IJ SOLN
10.0000 mL | INTRAMUSCULAR | Status: DC | PRN
  Administered 2014-02-18: 10 mL
  Filled 2014-02-18: qty 10

## 2014-02-18 MED ORDER — ONDANSETRON 16 MG/50ML IVPB (CHCC)
INTRAVENOUS | Status: AC
  Filled 2014-02-18: qty 16

## 2014-02-18 MED ORDER — HEPARIN SOD (PORK) LOCK FLUSH 100 UNIT/ML IV SOLN
500.0000 [IU] | Freq: Once | INTRAVENOUS | Status: AC | PRN
  Administered 2014-02-18: 500 [IU]
  Filled 2014-02-18: qty 5

## 2014-02-18 MED ORDER — ONDANSETRON 16 MG/50ML IVPB (CHCC)
16.0000 mg | Freq: Once | INTRAVENOUS | Status: AC
  Administered 2014-02-18: 16 mg via INTRAVENOUS

## 2014-02-18 NOTE — Telephone Encounter (Signed)
Moved appt on Monday from 8am to 830am. Appt amoved due to called staff meeting.

## 2014-02-18 NOTE — Patient Instructions (Signed)
[facility] Discharge Instructions for Patients Receiving Chemotherapy  Today you received the following chemotherapy agents: taxotere, cytoxan  To help prevent nausea and vomiting after your treatment, we encourage you to take your nausea medication.  Take it as often as prescribed.     If you develop nausea and vomiting that is not controlled by your nausea medication, call the clinic. If it is after clinic hours your family physician or the after hours number for the clinic or go to the Emergency Department.   BELOW ARE SYMPTOMS THAT SHOULD BE REPORTED IMMEDIATELY:  *FEVER GREATER THAN 100.5 F  *CHILLS WITH OR WITHOUT FEVER  NAUSEA AND VOMITING THAT IS NOT CONTROLLED WITH YOUR NAUSEA MEDICATION  *UNUSUAL SHORTNESS OF BREATH  *UNUSUAL BRUISING OR BLEEDING  TENDERNESS IN MOUTH AND THROAT WITH OR WITHOUT PRESENCE OF ULCERS  *URINARY PROBLEMS  *BOWEL PROBLEMS  UNUSUAL RASH Items with * indicate a potential emergency and should be followed up as soon as possible.  Feel free to call the clinic you have any questions or concerns. The clinic phone number is [phone].   I have been informed and understand all the instructions given to me. I know to contact the clinic, my physician, or go to the Emergency Department if any problems should occur. I do not have any questions at this time, but understand that I may call the clinic during office hours   should I have any questions or need assistance in obtaining follow up care.    __________________________________________  _____________  __________ Signature of Patient or Authorized Representative            Date                   Time    __________________________________________ Nurse's Signature

## 2014-02-18 NOTE — Telephone Encounter (Signed)
, °

## 2014-02-18 NOTE — Patient Instructions (Signed)
Proceed with chemotherapy today  Return on 3/28 for neulasta injection and IVF  IVF on 3/30, 3/31  Follow up on 02/25/14

## 2014-02-18 NOTE — Telephone Encounter (Signed)
Per staff message and POF I have scheduled appts.  JMW  

## 2014-02-19 ENCOUNTER — Ambulatory Visit (HOSPITAL_BASED_OUTPATIENT_CLINIC_OR_DEPARTMENT_OTHER): Payer: 59

## 2014-02-19 ENCOUNTER — Ambulatory Visit: Payer: Medicare Other

## 2014-02-19 VITALS — BP 147/79 | HR 64 | Temp 97.6°F | Resp 16

## 2014-02-19 DIAGNOSIS — C773 Secondary and unspecified malignant neoplasm of axilla and upper limb lymph nodes: Secondary | ICD-10-CM

## 2014-02-19 DIAGNOSIS — C50412 Malignant neoplasm of upper-outer quadrant of left female breast: Secondary | ICD-10-CM

## 2014-02-19 DIAGNOSIS — E860 Dehydration: Secondary | ICD-10-CM

## 2014-02-19 DIAGNOSIS — C50919 Malignant neoplasm of unspecified site of unspecified female breast: Secondary | ICD-10-CM

## 2014-02-19 DIAGNOSIS — Z5189 Encounter for other specified aftercare: Secondary | ICD-10-CM

## 2014-02-19 MED ORDER — HEPARIN SOD (PORK) LOCK FLUSH 100 UNIT/ML IV SOLN
500.0000 [IU] | Freq: Once | INTRAVENOUS | Status: AC
  Administered 2014-02-19: 500 [IU] via INTRAVENOUS
  Filled 2014-02-19: qty 5

## 2014-02-19 MED ORDER — SODIUM CHLORIDE 0.9 % IV SOLN
INTRAVENOUS | Status: DC
  Administered 2014-02-19: 10:00:00 via INTRAVENOUS

## 2014-02-19 MED ORDER — SODIUM CHLORIDE 0.9 % IJ SOLN
10.0000 mL | INTRAMUSCULAR | Status: DC | PRN
  Administered 2014-02-19: 10 mL via INTRAVENOUS
  Filled 2014-02-19: qty 10

## 2014-02-19 MED ORDER — PEGFILGRASTIM INJECTION 6 MG/0.6ML
6.0000 mg | Freq: Once | SUBCUTANEOUS | Status: AC
  Administered 2014-02-19: 6 mg via SUBCUTANEOUS

## 2014-02-19 NOTE — Patient Instructions (Signed)
Dehydration, Adult Dehydration is when you lose more fluids from the body than you take in. Vital organs like the kidneys, brain, and heart cannot function without a proper amount of fluids and salt. Any loss of fluids from the body can cause dehydration.  CAUSES   Vomiting.  Diarrhea.  Excessive sweating.  Excessive urine output.  Fever. SYMPTOMS  Mild dehydration  Thirst.  Dry lips.  Slightly dry mouth. Moderate dehydration  Very dry mouth.  Sunken eyes.  Skin does not bounce back quickly when lightly pinched and released.  Dark urine and decreased urine production.  Decreased tear production.  Headache. Severe dehydration  Very dry mouth.  Extreme thirst.  Rapid, weak pulse (more than 100 beats per minute at rest).  Cold hands and feet.  Not able to sweat in spite of heat and temperature.  Rapid breathing.  Blue lips.  Confusion and lethargy.  Difficulty being awakened.  Minimal urine production.  No tears. DIAGNOSIS  Your caregiver will diagnose dehydration based on your symptoms and your exam. Blood and urine tests will help confirm the diagnosis. The diagnostic evaluation should also identify the cause of dehydration. TREATMENT  Treatment of mild or moderate dehydration can often be done at home by increasing the amount of fluids that you drink. It is best to drink small amounts of fluid more often. Drinking too much at one time can make vomiting worse. Refer to the home care instructions below. Severe dehydration needs to be treated at the hospital where you will probably be given intravenous (IV) fluids that contain water and electrolytes. HOME CARE INSTRUCTIONS   Ask your caregiver about specific rehydration instructions.  Drink enough fluids to keep your urine clear or pale yellow.  Drink small amounts frequently if you have nausea and vomiting.  Eat as you normally do.  Avoid:  Foods or drinks high in sugar.  Carbonated  drinks.  Juice.  Extremely hot or cold fluids.  Drinks with caffeine.  Fatty, greasy foods.  Alcohol.  Tobacco.  Overeating.  Gelatin desserts.  Wash your hands well to avoid spreading bacteria and viruses.  Only take over-the-counter or prescription medicines for pain, discomfort, or fever as directed by your caregiver.  Ask your caregiver if you should continue all prescribed and over-the-counter medicines.  Keep all follow-up appointments with your caregiver. SEEK MEDICAL CARE IF:  You have abdominal pain and it increases or stays in one area (localizes).  You have a rash, stiff neck, or severe headache.  You are irritable, sleepy, or difficult to awaken.  You are weak, dizzy, or extremely thirsty. SEEK IMMEDIATE MEDICAL CARE IF:   You are unable to keep fluids down or you get worse despite treatment.  You have frequent episodes of vomiting or diarrhea.  You have blood or green matter (bile) in your vomit.  You have blood in your stool or your stool looks black and tarry.  You have not urinated in 6 to 8 hours, or you have only urinated a small amount of very dark urine.  You have a fever.  You faint. MAKE SURE YOU:   Understand these instructions.  Will watch your condition.  Will get help right away if you are not doing well or get worse. Document Released: 11/11/2005 Document Revised: 02/03/2012 Document Reviewed: 07/01/2011 [facility] Patient Information 2014 [place], [place].  Pegfilgrastim injection What is this medicine? PEGFILGRASTIM (peg fil GRA stim) helps the body make more white blood cells. It is used to prevent infection in people  with low amounts of white blood cells following cancer treatment. This medicine may be used for other purposes; ask your health care provider or pharmacist if you have questions. COMMON BRAND NAME(S): Neulasta What should I tell my health care provider before I take this medicine? They need to know if you have  any of these conditions: -sickle cell disease -an unusual or allergic reaction to pegfilgrastim, filgrastim, E.coli protein, other medicines, foods, dyes, or preservatives -pregnant or trying to get pregnant -breast-feeding How should I use this medicine? This medicine is for injection under the skin. It is usually given by a health care professional in a hospital or clinic setting. If you get this medicine at home, you will be taught how to prepare and give this medicine. Do not shake this medicine. Use exactly as directed. Take your medicine at regular intervals. Do not take your medicine more often than directed. It is important that you put your used needles and syringes in a special sharps container. Do not put them in a trash can. If you do not have a sharps container, call your pharmacist or healthcare provider to get one. Talk to your pediatrician regarding the use of this medicine in children. While this drug may be prescribed for children who weigh more than 45 kg for selected conditions, precautions do apply Overdosage: If you think you have taken too much of this medicine contact a poison control center or emergency room at once. NOTE: This medicine is only for you. Do not share this medicine with others. What if I miss a dose? If you miss a dose, take it as soon as you can. If it is almost time for your next dose, take only that dose. Do not take double or extra doses. What may interact with this medicine? -lithium -medicines for growth therapy This list may not describe all possible interactions. Give your health care provider a list of all the medicines, herbs, non-prescription drugs, or dietary supplements you use. Also tell them if you smoke, drink alcohol, or use illegal drugs. Some items may interact with your medicine. What should I watch for while using this medicine? Visit your doctor for regular check ups. You will need important blood work done while you are taking this  medicine. What side effects may I notice from receiving this medicine? Side effects that you should report to your doctor or health care professional as soon as possible: -allergic reactions like skin rash, itching or hives, swelling of the face, lips, or tongue -breathing problems -fever -pain, redness, or swelling where injected -shoulder pain -stomach or side pain Side effects that usually do not require medical attention (report to your doctor or health care professional if they continue or are bothersome): -aches, pains -headache -loss of appetite -nausea, vomiting -unusually tired This list may not describe all possible side effects. Call your doctor for medical advice about side effects. You may report side effects to FDA at 1-800-FDA-1088. Where should I keep my medicine? Keep out of the reach of children. Store in a refrigerator between 2 and 8 degrees C (36 and 46 degrees F). Do not freeze. Keep in carton to protect from light. Throw away this medicine if it is left out of the refrigerator for more than 48 hours. Throw away any unused medicine after the expiration date. NOTE: This sheet is a summary. It may not cover all possible information. If you have questions about this medicine, talk to your doctor, pharmacist, or health care provider.  2014, [employer]. (2008-06-13 15:41:44)

## 2014-02-19 NOTE — Progress Notes (Signed)
[facility] OFFICE PROGRESS NOTE  Patient Care Team: Stephens Shire, MD as PCP - General (Family Medicine) Dr. Ty Hilts  Dr. Thea Silversmith  DIAGNOSIS: 73 year old female with diagnosis of invasive ductal carcinoma of the left breast found on self examination  Breast cancer of upper-outer quadrant of left female breast  Primary site: Breast  Staging method: AJCC 7th Edition  Clinical: Stage IIB (T2, N1, cM0) signed by Thea Silversmith, MD on 11/04/2013 11:07 AM  Summary: Stage IIB (T2, N1, cM0)     SUMMARY OF ONCOLOGIC HISTORY: 1. palpated a mass in her left breast. Because of this she had a diagnostic mammogram and ultrasound performed on 10/27/2013. This showed a spiculated mass in the 6:00 position measuring approximately 3 cm. Ultrasound confirmed the hypoechoic mass at the 6:00 position measuring 2.6 x 2.1 x 1.5 cm. There was also noted to be a second mass in the 12:00 position measuring 2.1 x 1.8 x 2.1 cm. 2 abnormal lymph nodes were noted in the left axilla largest one measuring 1.2 cm. Patient had a biopsy of the primary breast masses. She also had a biopsy of the lymph node. All 3 biopsies were positive for invasive ductal carcinoma. Tumor was ER 100% PR 100% positive HER-2/neu negative with a proliferation marker Ki-67 low at 14% and low  #2 patient is status post left mastectomy Performed on 11/12/2013. The final pathology revealed:  Breast, modified radical mastectomy , left  - INVASIVE DUCTAL CARCINOMA.  - DUCTAL CARCINOMA IN SITU.  - MARGINS NOT INVOLVED.  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  - METASTATIC CARCINOMA IN 5 OF 10 LYMPH NODES (5/10).  #3 adjuvant chemotherapy consisting of Taxotere Cytoxan every 3 weeks for a total of 6 cycles curative intent beginning 12/17/2013   CURRENT THERAPY: Status post Taxotere Cytoxan cycle 4 day 1 on 02/18/14   INTERVAL HISTORY: [name] 73 y.o. female returns for followup visit for adjuvant  curative intent Taxotere Cytoxan cycle 4. She feels much better. She and I discussed the possibility of changing her chemotherapy to Mondays however patient feels that she wants to continue on Fridays instead. She is feeling much stronger. She thinks she tolerated the chemotherapy a whole lot better. She also feels that the IV fluids certainly do help her. She is encouraged to continue to come in on Saturday Monday and Tuesday of next week to receive IV fluids. I think she is doing well she has no headaches no double vision no nausea today. No fevers chills or night sweats no difficulty swallowing. She has very minimal numbness in her fingertips. It is not interfering with activities of daily living. Remainder of the 10 point review of systems is negative and as below.  I have reviewed the past medical history, past surgical history, social history and family history with the patient and they are unchanged from previous note.  ALLERGIES:  has No Known Allergies.  MEDICATIONS:  Current Outpatient Prescriptions  Medication Sig Dispense Refill  . aspirin EC 81 MG tablet Take 81 mg by mouth daily.      [name] atorvastatin (LIPITOR) 40 MG tablet Take 20 mg by mouth at bedtime.      . Calcium Carb-Cholecalciferol (CALCIUM 600 + D PO) Take 1 tablet by mouth daily.      [name] dexamethasone (DECADRON) 4 MG tablet Take 8 mg by mouth 2 (two) times daily with a meal. Take two times a day the day before Taxotere. Then take two times  a day starting the day after chemo for 3 days.      [name] lidocaine-prilocaine (EMLA) cream Apply topically as needed (port).      . LORazepam (ATIVAN) 0.5 MG tablet Take 0.5 mg by mouth every 6 (six) hours as needed (Nausea or vomiting).      . Multiple Vitamin (MULTIVITAMIN WITH MINERALS) TABS tablet Take 1 tablet by mouth daily.      . niacin (NIASPAN) 500 MG CR tablet Take 500 mg by mouth every other day. At bedtime      . NITROSTAT 0.4 MG SL tablet       . omega-3 acid ethyl esters (LOVAZA)  1 G capsule Take 1 g by mouth daily.      . ondansetron (ZOFRAN) 8 MG tablet Take 8 mg by mouth 2 (two) times daily. Take two times a day starting the day after chemo for 3 days. Then take two times a day as needed for nausea or vomiting.      [name] oxyCODONE (OXY IR/ROXICODONE) 5 MG immediate release tablet       . oxyCODONE-acetaminophen (PERCOCET) 10-325 MG per tablet Take 1 tablet by mouth every 6 (six) hours as needed for pain.  20 tablet  0  . Potassium 99 MG TABS Take 1 tablet by mouth daily.      . prochlorperazine (COMPAZINE) 10 MG tablet Take 10 mg by mouth every 6 (six) hours as needed for nausea or vomiting.      [name] UNABLE TO FIND Rx: L8000- Mastectomy Bra (Quantity: 2) Dx: 174.9; Left Mastectomy      . valsartan-hydrochlorothiazide (DIOVAN-HCT) 80-12.5 MG per tablet Take 1 tablet by mouth every morning.       No current facility-administered medications for this visit.    REVIEW OF SYSTEMS:   Constitutional: Denies fevers, chills or abnormal weight loss Eyes: Denies blurriness of vision Ears, nose, mouth, throat, and face: Denies mucositis or sore throat Respiratory: Denies cough, dyspnea or wheezes Cardiovascular: Denies palpitation, chest discomfort or lower extremity swelling Gastrointestinal:  Denies nausea, heartburn or change in bowel habits Skin: Denies abnormal skin rashes Lymphatics: Denies new lymphadenopathy or easy bruising Neurological:minimum numbness, tingling, no  weaknesses Behavioral/Psych: Mood is stable, no new changes  All other systems were reviewed with the patient and are negative.  PHYSICAL EXAMINATION: ECOG PERFORMANCE STATUS: 1 - Symptomatic but completely ambulatory  Filed Vitals:   02/18/14 1133  BP: 123/67  Pulse: 92  Temp: 97.3 F (36.3 C)  Resp: 18   Filed Weights   02/18/14 1133  Weight: 190 lb 6.4 oz (86.365 kg)    GENERAL:alert, no distress and comfortable SKIN: skin color, texture, turgor are normal, no rashes or significant  lesions EYES: normal, Conjunctiva are pink and non-injected, sclera clear OROPHARYNX:no exudate, no erythema and lips, buccal mucosa, and tongue normal  NECK: supple, thyroid normal size, non-tender, without nodularity LYMPH:  no palpable lymphadenopathy in the cervical, axillary or inguinal LUNGS: clear to auscultation and percussion with normal breathing effort HEART: regular rate & rhythm and no murmurs and no lower extremity edema ABDOMEN:abdomen soft, non-tender and normal bowel sounds Musculoskeletal:no cyanosis of digits and no clubbing  NEURO: alert & oriented x 3 with fluent speech, no focal motor/sensory deficits  LABORATORY DATA:  I have reviewed the data as listed    Component Value Date/Time   NA 144 02/18/2014 1120   NA 143 12/10/2013 1150   K 3.8 02/18/2014 1120   K 3.7 12/10/2013 1150  CL 102 12/10/2013 1150   CO2 19* 02/18/2014 1120   CO2 28 12/10/2013 1150   GLUCOSE 172* 02/18/2014 1120   GLUCOSE 90 12/10/2013 1150   BUN 13.2 02/18/2014 1120   BUN 23 12/10/2013 1150   CREATININE 0.9 02/18/2014 1120   CREATININE 0.99 12/10/2013 1150   CALCIUM 9.3 02/18/2014 1120   CALCIUM 9.6 12/10/2013 1150   PROT 6.3* 02/18/2014 1120   PROT 6.6 12/10/2013 1150   ALBUMIN 3.7 02/18/2014 1120   ALBUMIN 3.9 12/10/2013 1150   AST 23 02/18/2014 1120   AST 26 12/10/2013 1150   ALT 39 02/18/2014 1120   ALT 39* 12/10/2013 1150   ALKPHOS 76 02/18/2014 1120   ALKPHOS 60 12/10/2013 1150   BILITOT 0.48 02/18/2014 1120   BILITOT 0.3 12/10/2013 1150   GFRNONAA 56* 12/10/2013 1150   GFRAA 64* 12/10/2013 1150    No results found for this basename: SPEP, UPEP,  kappa and lambda light chains    Lab Results  Component Value Date   WBC 10.5* 02/18/2014   NEUTROABS 9.1* 02/18/2014   HGB 12.0 02/18/2014   HCT 36.3 02/18/2014   MCV 90.8 02/18/2014   PLT 240 02/18/2014      Chemistry      Component Value Date/Time   NA 144 02/18/2014 1120   NA 143 12/10/2013 1150   K 3.8 02/18/2014 1120   K 3.7 12/10/2013 1150    CL 102 12/10/2013 1150   CO2 19* 02/18/2014 1120   CO2 28 12/10/2013 1150   BUN 13.2 02/18/2014 1120   BUN 23 12/10/2013 1150   CREATININE 0.9 02/18/2014 1120   CREATININE 0.99 12/10/2013 1150      Component Value Date/Time   CALCIUM 9.3 02/18/2014 1120   CALCIUM 9.6 12/10/2013 1150   ALKPHOS 76 02/18/2014 1120   ALKPHOS 60 12/10/2013 1150   AST 23 02/18/2014 1120   AST 26 12/10/2013 1150   ALT 39 02/18/2014 1120   ALT 39* 12/10/2013 1150   BILITOT 0.48 02/18/2014 1120   BILITOT 0.3 12/10/2013 1150       RADIOGRAPHIC STUDIES: I have personally reviewed the radiological images as listed and agreed with the findings in the report. No results found.    ASSESSMENT & PLAN:  73 year old female with  #1 stage II invasive ductal carcinoma node positive status post left mastectomy on 11/12/2013. Tumor was ER positive HER-2/neu negative. Patient did have staging studies performed which showed no evidence of distant disease.  #2 patient was begun on adjuvant chemotherapy with curative intent consisting of Taxotere Cytoxan  beginning 12/17/2013. Thus far she has completed 3 cycles. She is here for cycle 4 day 1 of her treatment.  her blood work looks good we will proceed with scheduled chemotherapy. She does receive G-CSF support with Neulasta.   #3 Neulasta:  She is instructed to take Claritin and Tylenol to prevent aches and pains from the Neulasta.  #4 grade 1 neuropathy: If the neuropathy progresses we certainly can start around super B complex as well as Neurontin.  #5 dehydration: Patient will receive IV fluids on Saturday, Monday, Tuesday. She will also be scheduled for IV fluids on the day of her visit in 1 week for interim lab and chemotherapy toxicity evaluation. Certainly all of these interventions will help to keep the patient out of the hospital.   No orders of the defined types were placed in this encounter.   All questions were answered. The patient knows to call the clinic with  any  problems, questions or concerns. No barriers to learning was detected. I spent 20 minutes counseling the patient face to face. The total time spent in the appointment was 30 minutes and more than 50% was on counseling and review of test results     Marcy Panning, MD 02/19/2014 10:43 PM

## 2014-02-19 NOTE — Patient Instructions (Signed)
Dehydration, Adult Dehydration is when you lose more fluids from the body than you take in. Vital organs like the kidneys, brain, and heart cannot function without a proper amount of fluids and salt. Any loss of fluids from the body can cause dehydration.  CAUSES   Vomiting.  Diarrhea.  Excessive sweating.  Excessive urine output.  Fever. SYMPTOMS  Mild dehydration  Thirst.  Dry lips.  Slightly dry mouth. Moderate dehydration  Very dry mouth.  Sunken eyes.  Skin does not bounce back quickly when lightly pinched and released.  Dark urine and decreased urine production.  Decreased tear production.  Headache. Severe dehydration  Very dry mouth.  Extreme thirst.  Rapid, weak pulse (more than 100 beats per minute at rest).  Cold hands and feet.  Not able to sweat in spite of heat and temperature.  Rapid breathing.  Blue lips.  Confusion and lethargy.  Difficulty being awakened.  Minimal urine production.  No tears. DIAGNOSIS  Your caregiver will diagnose dehydration based on your symptoms and your exam. Blood and urine tests will help confirm the diagnosis. The diagnostic evaluation should also identify the cause of dehydration. TREATMENT  Treatment of mild or moderate dehydration can often be done at home by increasing the amount of fluids that you drink. It is best to drink small amounts of fluid more often. Drinking too much at one time can make vomiting worse. Refer to the home care instructions below. Severe dehydration needs to be treated at the hospital where you will probably be given intravenous (IV) fluids that contain water and electrolytes. HOME CARE INSTRUCTIONS   Ask your caregiver about specific rehydration instructions.  Drink enough fluids to keep your urine clear or pale yellow.  Drink small amounts frequently if you have nausea and vomiting.  Eat as you normally do.  Avoid:  Foods or drinks high in sugar.  Carbonated  drinks.  Juice.  Extremely hot or cold fluids.  Drinks with caffeine.  Fatty, greasy foods.  Alcohol.  Tobacco.  Overeating.  Gelatin desserts.  Wash your hands well to avoid spreading bacteria and viruses.  Only take over-the-counter or prescription medicines for pain, discomfort, or fever as directed by your caregiver.  Ask your caregiver if you should continue all prescribed and over-the-counter medicines.  Keep all follow-up appointments with your caregiver. SEEK MEDICAL CARE IF:  You have abdominal pain and it increases or stays in one area (localizes).  You have a rash, stiff neck, or severe headache.  You are irritable, sleepy, or difficult to awaken.  You are weak, dizzy, or extremely thirsty. SEEK IMMEDIATE MEDICAL CARE IF:   You are unable to keep fluids down or you get worse despite treatment.  You have frequent episodes of vomiting or diarrhea.  You have blood or green matter (bile) in your vomit.  You have blood in your stool or your stool looks black and tarry.  You have not urinated in 6 to 8 hours, or you have only urinated a small amount of very dark urine.  You have a fever.  You faint. MAKE SURE YOU:   Understand these instructions.  Will watch your condition.  Will get help right away if you are not doing well or get worse. Document Released: 11/11/2005 Document Revised: 02/03/2012 Document Reviewed: 07/01/2011 [facility] Patient Information 2014 [place], [place].  Pegfilgrastim injection What is this medicine? PEGFILGRASTIM (peg fil GRA stim) helps the body make more white blood cells. It is used to prevent infection in people  with low amounts of white blood cells following cancer treatment. This medicine may be used for other purposes; ask your health care provider or pharmacist if you have questions. COMMON BRAND NAME(S): Neulasta What should I tell my health care provider before I take this medicine? They need to know if you have  any of these conditions: -sickle cell disease -an unusual or allergic reaction to pegfilgrastim, filgrastim, E.coli protein, other medicines, foods, dyes, or preservatives -pregnant or trying to get pregnant -breast-feeding How should I use this medicine? This medicine is for injection under the skin. It is usually given by a health care professional in a hospital or clinic setting. If you get this medicine at home, you will be taught how to prepare and give this medicine. Do not shake this medicine. Use exactly as directed. Take your medicine at regular intervals. Do not take your medicine more often than directed. It is important that you put your used needles and syringes in a special sharps container. Do not put them in a trash can. If you do not have a sharps container, call your pharmacist or healthcare provider to get one. Talk to your pediatrician regarding the use of this medicine in children. While this drug may be prescribed for children who weigh more than 45 kg for selected conditions, precautions do apply Overdosage: If you think you have taken too much of this medicine contact a poison control center or emergency room at once. NOTE: This medicine is only for you. Do not share this medicine with others. What if I miss a dose? If you miss a dose, take it as soon as you can. If it is almost time for your next dose, take only that dose. Do not take double or extra doses. What may interact with this medicine? -lithium -medicines for growth therapy This list may not describe all possible interactions. Give your health care provider a list of all the medicines, herbs, non-prescription drugs, or dietary supplements you use. Also tell them if you smoke, drink alcohol, or use illegal drugs. Some items may interact with your medicine. What should I watch for while using this medicine? Visit your doctor for regular check ups. You will need important blood work done while you are taking this  medicine. What side effects may I notice from receiving this medicine? Side effects that you should report to your doctor or health care professional as soon as possible: -allergic reactions like skin rash, itching or hives, swelling of the face, lips, or tongue -breathing problems -fever -pain, redness, or swelling where injected -shoulder pain -stomach or side pain Side effects that usually do not require medical attention (report to your doctor or health care professional if they continue or are bothersome): -aches, pains -headache -loss of appetite -nausea, vomiting -unusually tired This list may not describe all possible side effects. Call your doctor for medical advice about side effects. You may report side effects to FDA at 1-800-FDA-1088. Where should I keep my medicine? Keep out of the reach of children. Store in a refrigerator between 2 and 8 degrees C (36 and 46 degrees F). Do not freeze. Keep in carton to protect from light. Throw away this medicine if it is left out of the refrigerator for more than 48 hours. Throw away any unused medicine after the expiration date. NOTE: This sheet is a summary. It may not cover all possible information. If you have questions about this medicine, talk to your doctor, pharmacist, or health care provider.  2014, [employer] Standard. (2008-06-13 15:41:44)

## 2014-02-21 ENCOUNTER — Ambulatory Visit (HOSPITAL_BASED_OUTPATIENT_CLINIC_OR_DEPARTMENT_OTHER): Payer: 59

## 2014-02-21 VITALS — BP 117/59 | HR 79 | Temp 97.4°F | Resp 18

## 2014-02-21 DIAGNOSIS — E860 Dehydration: Secondary | ICD-10-CM

## 2014-02-21 DIAGNOSIS — C50919 Malignant neoplasm of unspecified site of unspecified female breast: Secondary | ICD-10-CM

## 2014-02-21 DIAGNOSIS — C773 Secondary and unspecified malignant neoplasm of axilla and upper limb lymph nodes: Secondary | ICD-10-CM

## 2014-02-21 MED ORDER — SODIUM CHLORIDE 0.9 % IV SOLN: INTRAVENOUS | Status: DC

## 2014-02-21 MED ORDER — SODIUM CHLORIDE 0.9 % IV SOLN
Freq: Once | INTRAVENOUS | Status: AC
  Administered 2014-02-21: 1 mL via INTRAVENOUS
  Administered 2014-02-21: 10:00:00 via INTRAVENOUS

## 2014-02-22 ENCOUNTER — Ambulatory Visit (HOSPITAL_BASED_OUTPATIENT_CLINIC_OR_DEPARTMENT_OTHER): Payer: 59

## 2014-02-22 VITALS — BP 150/70 | HR 75 | Temp 97.5°F | Resp 20

## 2014-02-22 DIAGNOSIS — E860 Dehydration: Secondary | ICD-10-CM

## 2014-02-22 DIAGNOSIS — C50919 Malignant neoplasm of unspecified site of unspecified female breast: Secondary | ICD-10-CM

## 2014-02-22 DIAGNOSIS — C773 Secondary and unspecified malignant neoplasm of axilla and upper limb lymph nodes: Secondary | ICD-10-CM

## 2014-02-22 MED ORDER — SODIUM CHLORIDE 0.9 % IJ SOLN
10.0000 mL | INTRAMUSCULAR | Status: DC | PRN
  Administered 2014-02-22: 10 mL via INTRAVENOUS
  Filled 2014-02-22: qty 10

## 2014-02-22 MED ORDER — HEPARIN SOD (PORK) LOCK FLUSH 100 UNIT/ML IV SOLN
500.0000 [IU] | Freq: Once | INTRAVENOUS | Status: AC
  Administered 2014-02-22: 500 [IU] via INTRAVENOUS
  Filled 2014-02-22: qty 5

## 2014-02-22 MED ORDER — SODIUM CHLORIDE 0.9 % IV SOLN
Freq: Once | INTRAVENOUS | Status: AC
  Administered 2014-02-22: 14:00:00 via INTRAVENOUS

## 2014-02-22 NOTE — Patient Instructions (Signed)
Dehydration, Adult  Dehydration means your body does not have as much fluid as it needs. Your kidneys, brain, and heart will not work properly without the right amount of fluids and salt.   HOME CARE   Ask your doctor how to replace body fluid losses (rehydrate).   Drink enough fluids to keep your pee (urine) clear or pale yellow.   Drink small amounts of fluids often if you feel sick to your stomach (nauseous) or throw up (vomit).   Eat like you normally do.   Avoid:   Foods or drinks high in sugar.   Bubbly (carbonated) drinks.   Juice.   Very hot or cold fluids.   Drinks with caffeine.   Fatty, greasy foods.   Alcohol.   Tobacco.   Eating too much.   Gelatin desserts.   Wash your hands to avoid spreading germs (bacteria, viruses).   Only take medicine as told by your doctor.   Keep all doctor visits as told.  GET HELP RIGHT AWAY IF:    You cannot drink something without throwing up.   You get worse even with treatment.   Your vomit has blood in it or looks greenish.   Your poop (stool) has blood in it or looks black and tarry.   You have not peed in 6 to 8 hours.   You pee a small amount of very dark pee.   You have a fever.   You pass out (faint).   You have belly (abdominal) pain that gets worse or stays in one spot (localizes).   You have a rash, stiff neck, or bad headache.   You get easily annoyed, sleepy, or are hard to wake up.   You feel weak, dizzy, or very thirsty.  MAKE SURE YOU:    Understand these instructions.   Will watch your condition.   Will get help right away if you are not doing well or get worse.  Document Released: 09/07/2009 Document Revised: 02/03/2012 Document Reviewed: 07/01/2011  [employer] Patient Information 2014 [employer].

## 2014-02-24 NOTE — Progress Notes (Signed)
Hematology and Oncology Follow Up Visit  Wendy [name] [id number] 10/28/1941 73 y.o. 02/26/2014 12:39 PM     Principle Diagnosis:Wendy [name] 73 y.o. 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     Prior Therapy:  1.  The patient palpated a mass in her left breast. Because of this she had a diagnostic mammogram and ultrasound performed on 10/27/2013. This showed a spiculated mass in the 6:00 position measuring approximately 3 cm. Ultrasound confirmed the hypoechoic mass at the 6:00 position measuring 2.6 x 2.1 x 1.5 cm. There was also noted to be a second mass in the 12:00 position measuring 2.1 x 1.8 x 2.1 cm. 2 abnormal lymph nodes were noted in the left axilla largest one measuring 1.2 cm. Patient had a biopsy of the primary breast masses. She also had a biopsy of the lymph node. All 3 biopsies were positive for invasive ductal carcinoma. Tumor was ER 100% PR 100% positive HER-2/neu negative with a proliferation marker Ki-67 low at 14% and low   #2 patient is status post left mastectomy Performed on 11/12/2013. The final pathology revealed:  Breast, modified radical mastectomy , left  - INVASIVE DUCTAL CARCINOMA.  - DUCTAL CARCINOMA IN SITU.  - MARGINS NOT INVOLVED.  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  - METASTATIC CARCINOMA IN 5 OF 10 LYMPH NODES (5/10).   #3 adjuvant chemotherapy consisting of Taxotere Cytoxan every 3 weeks for a total of 6 cycles curative intent beginning 12/17/2013    Current therapy:  Taxotere/Cytoxan cycle 4 day 8  Interim History: Wendy [name] 73 y.o. female with stage IIB ER/PR positive invasive ductal carcinoma of the left breast.  She receives Adjuvant Taxotere/Cytoxan given on day 1 of a 21 day cycle.  She receives Neulasta support on day 2.  She feels moderately well today.  She is experiencing fatigue, decreased appetite, nasal drainage, decreased ability to sleep, nausea  and weakness.  Her nasal drainage is clear.  She thinks its related to her allergies.  It usually occurs the first week following chemotherapy and resolves by the Sunday of the second week following treatment.  Her sleeplessness occurs occasionally.  She did sleep better last night with taking Lorazepam.  She has had some nausea that is intermittent.  She hasn't thrown up.  It started on Sunday evening and was worse on Monday and Tuesday.  She does endorse taste changes.  She is drinking 64 ounces of fluid per day at least.  Her fingernails are currently painful and are getting a bluish tent.  She denies fevers, chills, constipation, diarrhea, numbness tingling, mouth pain,or any further concerns.    Medications:  Current Outpatient Prescriptions  Medication Sig Dispense Refill  . aspirin EC 81 MG tablet Take 81 mg by mouth daily.      [name] atorvastatin (LIPITOR) 40 MG tablet Take 20 mg by mouth at bedtime.      . Calcium Carb-Cholecalciferol (CALCIUM 600 + D PO) Take 1 tablet by mouth daily.      [name] lidocaine-prilocaine (EMLA) cream Apply topically as needed (port).      . LORazepam (ATIVAN) 0.5 MG tablet Take 0.5 mg by mouth every 6 (six) hours as needed (Nausea or vomiting).      . Multiple Vitamin (MULTIVITAMIN WITH MINERALS) TABS tablet Take 1 tablet by mouth daily.      . niacin (NIASPAN) 500 MG CR tablet Take 500 mg by mouth every  other day. At bedtime      . NITROSTAT 0.4 MG SL tablet       . omega-3 acid ethyl esters (LOVAZA) 1 G capsule Take 1 g by mouth daily.      [name] UNABLE TO FIND Rx: L8000- Mastectomy Bra (Quantity: 2) Dx: 174.9; Left Mastectomy      . dexamethasone (DECADRON) 4 MG tablet Take 8 mg by mouth 2 (two) times daily with a meal. Take two times a day the day before Taxotere. Then take two times a day starting the day after chemo for 3 days.      . ondansetron (ZOFRAN) 8 MG tablet Take 8 mg by mouth 2 (two) times daily. Take two times a day starting the day after chemo for 3 days.  Then take two times a day as needed for nausea or vomiting.      [name] oxyCODONE (OXY IR/ROXICODONE) 5 MG immediate release tablet       . oxyCODONE-acetaminophen (PERCOCET) 10-325 MG per tablet Take 1 tablet by mouth every 6 (six) hours as needed for pain.  20 tablet  0  . Potassium 99 MG TABS Take 1 tablet by mouth daily.      . prochlorperazine (COMPAZINE) 10 MG tablet Take 10 mg by mouth every 6 (six) hours as needed for nausea or vomiting.      . valsartan-hydrochlorothiazide (DIOVAN-HCT) 80-12.5 MG per tablet Take 1 tablet by mouth every morning.       No current facility-administered medications for this visit.     Allergies: No Known Allergies  Medical History: Past Medical History  Diagnosis Date  . Hypertension   . CAD (coronary artery disease)   . Hyperlipidemia   . Peripheral vascular disease     atherosclerosis carotid artery-mild  . Breast cancer     left breast    Surgical History:  Past Surgical History  Procedure Laterality Date  . Percutaneous coronary stent intervention (pci-s)    . Abdominal hysterectomy    . Nephrectomy living donor Left 2000  . Knee arthroscopy      knee  . Mastectomy modified radical Left 11/12/2013    Procedure: MASTECTOMY MODIFIED RADICAL;  Surgeon: Rolm Bookbinder, MD;  Location: [facility];  Service: General;  Laterality: Left;  . Portacath placement Right 12/16/2013    Procedure: INSERTION PORT-A-CATH;  Surgeon: Rolm Bookbinder, MD;  Location: [facility];  Service: General;  Laterality: Right;     Review of Systems: A 10 point review of systems was conducted and is otherwise negative except for what is noted above.     Physical Exam: Blood pressure 114/65, pulse 93, temperature 98 F (36.7 C), temperature source Oral, resp. rate 18, height 5' 8"  (1.727 m), weight 184 lb 1.6 oz (83.507 kg), SpO2 98.00%. GENERAL: Patient is a well appearing female in no acute distress HEENT:  Sclerae anicteric.  Oropharynx clear and moist. No  ulcerations or evidence of oropharyngeal candidiasis. Neck is supple.  NODES:  No cervical, supraclavicular, or axillary lymphadenopathy palpated.  BREAST EXAM:  Deferred. LUNGS:  Clear to auscultation bilaterally.  No wheezes or rhonchi. HEART:  Regular rate and rhythm. No murmur appreciated. ABDOMEN:  Soft, nontender.  Positive, normoactive bowel sounds. No organomegaly palpated. MSK:  No focal spinal tenderness to palpation. Full range of motion bilaterally in the upper extremities. EXTREMITIES:  No peripheral edema.   SKIN:  Clear with no obvious rashes or skin changes. Slightly brittle fingernails.  No discoloration noted.  NEURO:  Nonfocal. Well oriented.  Appropriate affect. ECOG PERFORMANCE STATUS: 1 - Symptomatic but completely ambulatory   Lab Results: Lab Results  Component Value Date   WBC 6.9 02/25/2014   HGB 12.1 02/25/2014   HCT 36.3 02/25/2014   MCV 89.9 02/25/2014   PLT 149 02/25/2014     Chemistry      Component Value Date/Time   NA 139 02/25/2014 1113   NA 143 12/10/2013 1150   K 3.5 02/25/2014 1113   K 3.7 12/10/2013 1150   CL 102 12/10/2013 1150   CO2 25 02/25/2014 1113   CO2 28 12/10/2013 1150   BUN 16.0 02/25/2014 1113   BUN 23 12/10/2013 1150   CREATININE 0.8 02/25/2014 1113   CREATININE 0.99 12/10/2013 1150      Component Value Date/Time   CALCIUM 9.6 02/25/2014 1113   CALCIUM 9.6 12/10/2013 1150   ALKPHOS 79 02/25/2014 1113   ALKPHOS 60 12/10/2013 1150   AST 18 02/25/2014 1113   AST 26 12/10/2013 1150   ALT 25 02/25/2014 1113   ALT 39* 12/10/2013 1150   BILITOT 0.73 02/25/2014 1113   BILITOT 0.3 12/10/2013 1150      Assessment and Plan: Wendy [name] 73 y.o. female with  1. Stage IIB ER/PR positive invasive ductal carcinoma of the left breast.  The patient is s/p mastectomy.  She is now receiving adjuvant Taxotere and Cytoxan given on day 1 of a 21 day cycle with Neulasta support given on day 2.  She is currently cycle 4 day 8 of 6 planned treatment cycles.  Her labs are  stable today.  She does not feel dehydrated and wants to cancel her IV hydration appointment which I did.    2. Nail dyscrasia.  The patient has the beginnings of brittle fingernails.  I recommended tea tree oil soaks daily.    The patient will return in 2 weeks for labs, evaluation, and cycle 5 day 1 of Taxotere/Cytoxan chemotherapy.  She knows to call us in the interim should she have any questions or concerns.  We can certainly see her sooner if needed.   I spent 25 minutes counseling the patient face to face.  The total time spent in the appointment was 30 minutes.  Minette Headland, [place] [phone] 02/26/2014 12:39 PM

## 2014-02-25 ENCOUNTER — Other Ambulatory Visit (HOSPITAL_BASED_OUTPATIENT_CLINIC_OR_DEPARTMENT_OTHER): Payer: 59

## 2014-02-25 ENCOUNTER — Ambulatory Visit (HOSPITAL_BASED_OUTPATIENT_CLINIC_OR_DEPARTMENT_OTHER): Payer: 59 | Admitting: Adult Health

## 2014-02-25 ENCOUNTER — Encounter: Payer: Self-pay | Admitting: Adult Health

## 2014-02-25 ENCOUNTER — Ambulatory Visit: Payer: 59

## 2014-02-25 ENCOUNTER — Other Ambulatory Visit: Payer: Self-pay

## 2014-02-25 VITALS — BP 114/65 | HR 93 | Temp 98.0°F | Resp 18 | Ht 68.0 in | Wt 184.1 lb

## 2014-02-25 DIAGNOSIS — C773 Secondary and unspecified malignant neoplasm of axilla and upper limb lymph nodes: Secondary | ICD-10-CM

## 2014-02-25 DIAGNOSIS — Z170 Estrogen receptor positive status [ER+]: Secondary | ICD-10-CM

## 2014-02-25 DIAGNOSIS — C50412 Malignant neoplasm of upper-outer quadrant of left female breast: Secondary | ICD-10-CM

## 2014-02-25 DIAGNOSIS — C50919 Malignant neoplasm of unspecified site of unspecified female breast: Secondary | ICD-10-CM

## 2014-02-25 LAB — CBC WITH DIFFERENTIAL/PLATELET
BASO%: 0.4 % (ref 0.0–2.0)
Basophils Absolute: 0 10*3/uL (ref 0.0–0.1)
EOS%: 1 % (ref 0.0–7.0)
Eosinophils Absolute: 0.1 10*3/uL (ref 0.0–0.5)
HCT: 36.3 % (ref 34.8–46.6)
HGB: 12.1 g/dL (ref 11.6–15.9)
LYMPH%: 17.5 % (ref 14.0–49.7)
MCH: 30 pg (ref 25.1–34.0)
MCHC: 33.3 g/dL (ref 31.5–36.0)
MCV: 89.9 fL (ref 79.5–101.0)
MONO#: 2.1 10*3/uL — ABNORMAL HIGH (ref 0.1–0.9)
MONO%: 30.4 % — ABNORMAL HIGH (ref 0.0–14.0)
NEUT#: 3.5 10*3/uL (ref 1.5–6.5)
NEUT%: 50.7 % (ref 38.4–76.8)
Platelets: 149 10*3/uL (ref 145–400)
RBC: 4.04 10*6/uL (ref 3.70–5.45)
RDW: 17.4 % — ABNORMAL HIGH (ref 11.2–14.5)
WBC: 6.9 10*3/uL (ref 3.9–10.3)
lymph#: 1.2 10*3/uL (ref 0.9–3.3)
nRBC: 0 % (ref 0–0)

## 2014-02-25 LAB — COMPREHENSIVE METABOLIC PANEL (CC13)
ALT: 25 U/L (ref 0–55)
AST: 18 U/L (ref 5–34)
Albumin: 3.6 g/dL (ref 3.5–5.0)
Alkaline Phosphatase: 79 U/L (ref 40–150)
Anion Gap: 10 mEq/L (ref 3–11)
BUN: 16 mg/dL (ref 7.0–26.0)
CO2: 25 mEq/L (ref 22–29)
Calcium: 9.6 mg/dL (ref 8.4–10.4)
Chloride: 103 mEq/L (ref 98–109)
Creatinine: 0.8 mg/dL (ref 0.6–1.1)
Glucose: 83 mg/dl (ref 70–140)
Potassium: 3.5 mEq/L (ref 3.5–5.1)
Sodium: 139 mEq/L (ref 136–145)
Total Bilirubin: 0.73 mg/dL (ref 0.20–1.20)
Total Protein: 5.9 g/dL — ABNORMAL LOW (ref 6.4–8.3)

## 2014-02-25 MED ORDER — SODIUM CHLORIDE 0.9 % IJ SOLN
10.0000 mL | INTRAMUSCULAR | Status: DC | PRN
  Filled 2014-02-25: qty 10

## 2014-02-25 MED ORDER — HEPARIN SOD (PORK) LOCK FLUSH 100 UNIT/ML IV SOLN
500.0000 [IU] | Freq: Once | INTRAVENOUS | Status: DC
  Filled 2014-02-25: qty 5

## 2014-02-28 ENCOUNTER — Telehealth: Payer: Self-pay | Admitting: Dietician

## 2014-02-28 ENCOUNTER — Other Ambulatory Visit: Payer: Self-pay

## 2014-02-28 NOTE — Telephone Encounter (Signed)
Brief Outpatient Oncology Nutrition Note  Patient has been identified to be at risk on malnutrition screen.  Wt Readings from Last 10 Encounters:  02/25/14 184 lb 1.6 oz (83.507 kg)  02/18/14 190 lb 6.4 oz (86.365 kg)  02/04/14 184 lb 14.4 oz (83.87 kg)  02/02/14 184 lb 6.4 oz (83.643 kg)  01/28/14 189 lb 6.4 oz (85.911 kg)  01/14/14 189 lb 9.6 oz (86.002 kg)  01/07/14 194 lb 8 oz (88.225 kg)  12/24/13 193 lb 8 oz (87.771 kg)  12/16/13 199 lb 11.2 oz (90.583 kg)  12/10/13 198 lb (89.812 kg)    Dx:  Breast Cancer   Patient of Dr. Humphrey Rolls.    Called patient due to weight loss.  Patient reports that she is doing better today and has eaten 2 meals.  "First week of chemo is always the worst." Experiences taste changes, decreased appetite and nausea which are now improved on week 2.    Patient had no questions at this time.  Has been drinking gatorade to maintain hydration and protein shakes as able when unable to eat well.  Lyndon RD available as needed.  Antonieta Iba, RD, LDN

## 2014-03-08 ENCOUNTER — Other Ambulatory Visit: Payer: Self-pay | Admitting: *Deleted

## 2014-03-08 DIAGNOSIS — C50419 Malignant neoplasm of upper-outer quadrant of unspecified female breast: Secondary | ICD-10-CM

## 2014-03-08 MED ORDER — DEXAMETHASONE 4 MG PO TABS: 8.0000 mg | ORAL_TABLET | Freq: Two times a day (BID) | ORAL | Status: DC

## 2014-03-10 ENCOUNTER — Encounter: Payer: Self-pay | Admitting: Oncology

## 2014-03-10 NOTE — Progress Notes (Signed)
Insurance is paying Neulasta and Cytoxan.

## 2014-03-11 ENCOUNTER — Other Ambulatory Visit (HOSPITAL_BASED_OUTPATIENT_CLINIC_OR_DEPARTMENT_OTHER): Payer: 59

## 2014-03-11 ENCOUNTER — Ambulatory Visit (HOSPITAL_BASED_OUTPATIENT_CLINIC_OR_DEPARTMENT_OTHER): Payer: 59 | Admitting: Adult Health

## 2014-03-11 ENCOUNTER — Other Ambulatory Visit: Payer: Self-pay | Admitting: *Deleted

## 2014-03-11 ENCOUNTER — Ambulatory Visit (HOSPITAL_BASED_OUTPATIENT_CLINIC_OR_DEPARTMENT_OTHER): Payer: 59

## 2014-03-11 ENCOUNTER — Encounter: Payer: Self-pay | Admitting: Adult Health

## 2014-03-11 VITALS — BP 101/63 | HR 102 | Temp 98.4°F | Resp 18 | Ht 68.0 in | Wt 187.2 lb

## 2014-03-11 DIAGNOSIS — C773 Secondary and unspecified malignant neoplasm of axilla and upper limb lymph nodes: Secondary | ICD-10-CM

## 2014-03-11 DIAGNOSIS — C50412 Malignant neoplasm of upper-outer quadrant of left female breast: Secondary | ICD-10-CM

## 2014-03-11 DIAGNOSIS — L609 Nail disorder, unspecified: Secondary | ICD-10-CM

## 2014-03-11 DIAGNOSIS — C50919 Malignant neoplasm of unspecified site of unspecified female breast: Secondary | ICD-10-CM

## 2014-03-11 DIAGNOSIS — Z170 Estrogen receptor positive status [ER+]: Secondary | ICD-10-CM

## 2014-03-11 DIAGNOSIS — Z5111 Encounter for antineoplastic chemotherapy: Secondary | ICD-10-CM

## 2014-03-11 LAB — COMPREHENSIVE METABOLIC PANEL (CC13)
ALT: 28 U/L (ref 0–55)
AST: 21 U/L (ref 5–34)
Albumin: 4 g/dL (ref 3.5–5.0)
Alkaline Phosphatase: 76 U/L (ref 40–150)
Anion Gap: 11 mEq/L (ref 3–11)
BUN: 16.5 mg/dL (ref 7.0–26.0)
CO2: 26 mEq/L (ref 22–29)
Calcium: 10.5 mg/dL — ABNORMAL HIGH (ref 8.4–10.4)
Chloride: 108 mEq/L (ref 98–109)
Creatinine: 0.9 mg/dL (ref 0.6–1.1)
Glucose: 185 mg/dl — ABNORMAL HIGH (ref 70–140)
Potassium: 4.1 mEq/L (ref 3.5–5.1)
Sodium: 144 mEq/L (ref 136–145)
Total Bilirubin: 0.41 mg/dL (ref 0.20–1.20)
Total Protein: 6.7 g/dL (ref 6.4–8.3)

## 2014-03-11 LAB — CBC WITH DIFFERENTIAL/PLATELET
BASO%: 0.1 % (ref 0.0–2.0)
Basophils Absolute: 0 10*3/uL (ref 0.0–0.1)
EOS%: 0 % (ref 0.0–7.0)
Eosinophils Absolute: 0 10*3/uL (ref 0.0–0.5)
HCT: 36.4 % (ref 34.8–46.6)
HGB: 11.8 g/dL (ref 11.6–15.9)
LYMPH%: 6.2 % — ABNORMAL LOW (ref 14.0–49.7)
MCH: 29.7 pg (ref 25.1–34.0)
MCHC: 32.4 g/dL (ref 31.5–36.0)
MCV: 91.7 fL (ref 79.5–101.0)
MONO#: 1.1 10*3/uL — ABNORMAL HIGH (ref 0.1–0.9)
MONO%: 7.9 % (ref 0.0–14.0)
NEUT#: 11.3 10*3/uL — ABNORMAL HIGH (ref 1.5–6.5)
NEUT%: 85.8 % — ABNORMAL HIGH (ref 38.4–76.8)
Platelets: 264 10*3/uL (ref 145–400)
RBC: 3.97 10*6/uL (ref 3.70–5.45)
RDW: 17.8 % — ABNORMAL HIGH (ref 11.2–14.5)
WBC: 13.2 10*3/uL — ABNORMAL HIGH (ref 3.9–10.3)
lymph#: 0.8 10*3/uL — ABNORMAL LOW (ref 0.9–3.3)

## 2014-03-11 MED ORDER — HEPARIN SOD (PORK) LOCK FLUSH 100 UNIT/ML IV SOLN
500.0000 [IU] | Freq: Once | INTRAVENOUS | Status: AC | PRN
  Administered 2014-03-11: 500 [IU]
  Filled 2014-03-11: qty 5

## 2014-03-11 MED ORDER — ONDANSETRON 16 MG/50ML IVPB (CHCC)
16.0000 mg | Freq: Once | INTRAVENOUS | Status: AC
  Administered 2014-03-11: 16 mg via INTRAVENOUS

## 2014-03-11 MED ORDER — SODIUM CHLORIDE 0.9 % IJ SOLN
10.0000 mL | INTRAMUSCULAR | Status: DC | PRN
  Administered 2014-03-11: 10 mL
  Filled 2014-03-11: qty 10

## 2014-03-11 MED ORDER — DEXAMETHASONE SODIUM PHOSPHATE 20 MG/5ML IJ SOLN
INTRAMUSCULAR | Status: AC
  Filled 2014-03-11: qty 5

## 2014-03-11 MED ORDER — SODIUM CHLORIDE 0.9 % IV SOLN
75.0000 mg/m2 | Freq: Once | INTRAVENOUS | Status: AC
  Administered 2014-03-11: 160 mg via INTRAVENOUS
  Filled 2014-03-11: qty 16

## 2014-03-11 MED ORDER — DEXAMETHASONE SODIUM PHOSPHATE 20 MG/5ML IJ SOLN
20.0000 mg | Freq: Once | INTRAMUSCULAR | Status: AC
  Administered 2014-03-11: 20 mg via INTRAVENOUS

## 2014-03-11 MED ORDER — ONDANSETRON 16 MG/50ML IVPB (CHCC)
INTRAVENOUS | Status: AC
  Filled 2014-03-11: qty 16

## 2014-03-11 MED ORDER — SODIUM CHLORIDE 0.9 % IV SOLN
Freq: Once | INTRAVENOUS | Status: AC
  Administered 2014-03-11: 15:00:00 via INTRAVENOUS

## 2014-03-11 MED ORDER — CYCLOPHOSPHAMIDE CHEMO INJECTION 1 GM
600.0000 mg/m2 | Freq: Once | INTRAMUSCULAR | Status: AC
  Administered 2014-03-11: 1240 mg via INTRAVENOUS
  Filled 2014-03-11: qty 62

## 2014-03-11 NOTE — Progress Notes (Signed)
Hematology and Oncology Follow Up Visit  Wendy [name] [id number] 10/03/1941 73 y.o. 03/13/2014 9:29 AM     Principle Diagnosis:Wendy [name] 73 y.o. 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     Prior Therapy:  1.  The patient palpated a mass in her left breast. Because of this she had a diagnostic mammogram and ultrasound performed on 10/27/2013. This showed a spiculated mass in the 6:00 position measuring approximately 3 cm. Ultrasound confirmed the hypoechoic mass at the 6:00 position measuring 2.6 x 2.1 x 1.5 cm. There was also noted to be a second mass in the 12:00 position measuring 2.1 x 1.8 x 2.1 cm. 2 abnormal lymph nodes were noted in the left axilla largest one measuring 1.2 cm. Patient had a biopsy of the primary breast masses. She also had a biopsy of the lymph node. All 3 biopsies were positive for invasive ductal carcinoma. Tumor was ER 100% PR 100% positive HER-2/neu negative with a proliferation marker Ki-67 low at 14% and low   #2 patient is status post left mastectomy Performed on 11/12/2013. The final pathology revealed:  Breast, modified radical mastectomy , left  - INVASIVE DUCTAL CARCINOMA.  - DUCTAL CARCINOMA IN SITU.  - MARGINS NOT INVOLVED.  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  - METASTATIC CARCINOMA IN 5 OF 10 LYMPH NODES (5/10).   #3 adjuvant chemotherapy consisting of Taxotere Cytoxan every 3 weeks for a total of 6 cycles curative intent beginning 12/17/2013    Current therapy:  Taxotere/Cytoxan cycle 5 day 1  Interim History: Wendy [name] 73 y.o. female with stage IIB ER/PR positive invasive ductal carcinoma of the left breast.  She receives Adjuvant Taxotere/Cytoxan given on day 1 of a 21 day cycle.  She receives Neulasta support on day 2.   She is doing well today.  She has had some significant seasonal allergies this past week and she is taking her allergy medication.   She does have some mild dry skin, brittle nails.  She couldn't tolerate the smell of tea tree oil after she used it.  Otherwise, she denies fevers, chills, nuasea, vomiting, constipation, diarrhea, numbness/tingling or any other concerns.  A 10 point ROS is neg.    Medications:  Current Outpatient Prescriptions  Medication Sig Dispense Refill  . aspirin EC 81 MG tablet Take 81 mg by mouth daily.      [name] atorvastatin (LIPITOR) 40 MG tablet Take 20 mg by mouth at bedtime.      . Calcium Carb-Cholecalciferol (CALCIUM 600 + D PO) Take 1 tablet by mouth daily.      [name] dexamethasone (DECADRON) 4 MG tablet Take 2 tablets (8 mg total) by mouth 2 (two) times daily with a meal. Take two times a day the day before Taxotere. Then take two times a day starting the day after chemo for 3 days.  30 tablet  1  . lidocaine-prilocaine (EMLA) cream Apply topically as needed (port).      . Multiple Vitamin (MULTIVITAMIN WITH MINERALS) TABS tablet Take 1 tablet by mouth daily.      . niacin (NIASPAN) 500 MG CR tablet Take 500 mg by mouth every other day. At bedtime      . omega-3 acid ethyl esters (LOVAZA) 1 G capsule Take 1 g by mouth daily.      . Potassium 99 MG TABS Take 1 tablet by mouth daily.      [name]  UNABLE TO FIND Rx: L8000- Mastectomy Bra (Quantity: 2) Dx: 174.9; Left Mastectomy      . LORazepam (ATIVAN) 0.5 MG tablet Take 0.5 mg by mouth every 6 (six) hours as needed (Nausea or vomiting).      [name] NITROSTAT 0.4 MG SL tablet       . ondansetron (ZOFRAN) 8 MG tablet Take 8 mg by mouth 2 (two) times daily. Take two times a day starting the day after chemo for 3 days. Then take two times a day as needed for nausea or vomiting.      [name] oxyCODONE (OXY IR/ROXICODONE) 5 MG immediate release tablet       . oxyCODONE-acetaminophen (PERCOCET) 10-325 MG per tablet Take 1 tablet by mouth every 6 (six) hours as needed for pain.  20 tablet  0  . prochlorperazine (COMPAZINE) 10 MG tablet Take 10 mg by mouth every 6 (six) hours  as needed for nausea or vomiting.      . valsartan-hydrochlorothiazide (DIOVAN-HCT) 80-12.5 MG per tablet Take 1 tablet by mouth every morning.       No current facility-administered medications for this visit.     Allergies: No Known Allergies  Medical History: Past Medical History  Diagnosis Date  . Hypertension   . CAD (coronary artery disease)   . Hyperlipidemia   . Peripheral vascular disease     atherosclerosis carotid artery-mild  . Breast cancer     left breast    Surgical History:  Past Surgical History  Procedure Laterality Date  . Percutaneous coronary stent intervention (pci-s)    . Abdominal hysterectomy    . Nephrectomy living donor Left 2000  . Knee arthroscopy      knee  . Mastectomy modified radical Left 11/12/2013    Procedure: MASTECTOMY MODIFIED RADICAL;  Surgeon: Rolm Bookbinder, MD;  Location: [facility];  Service: General;  Laterality: Left;  . Portacath placement Right 12/16/2013    Procedure: INSERTION PORT-A-CATH;  Surgeon: Rolm Bookbinder, MD;  Location: [facility];  Service: General;  Laterality: Right;     Review of Systems: A 10 point review of systems was conducted and is otherwise negative except for what is noted above.     Physical Exam: Blood pressure 101/63, pulse 102, temperature 98.4 F (36.9 C), temperature source Oral, resp. rate 18, height 5' 8"  (1.727 m), weight 187 lb 3.2 oz (84.913 kg). GENERAL: Patient is a well appearing female in no acute distress HEENT:  Sclerae anicteric.  Oropharynx clear and moist. No ulcerations or evidence of oropharyngeal candidiasis. Neck is supple.  NODES:  No cervical, supraclavicular, or axillary lymphadenopathy palpated.  BREAST EXAM:  Deferred. LUNGS:  Clear to auscultation bilaterally.  No wheezes or rhonchi. HEART:  Regular rate and rhythm. No murmur appreciated. ABDOMEN:  Soft, nontender.  Positive, normoactive bowel sounds. No organomegaly palpated. MSK:  No focal spinal tenderness to  palpation. Full range of motion bilaterally in the upper extremities. EXTREMITIES:  No peripheral edema.   SKIN:  Clear with no obvious rashes or skin changes. Slightly brittle fingernails.  No discoloration noted.  NEURO:  Nonfocal. Well oriented.  Appropriate affect. ECOG PERFORMANCE STATUS: 1 - Symptomatic but completely ambulatory   Lab Results: Lab Results  Component Value Date   WBC 13.2* 03/11/2014   HGB 11.8 03/11/2014   HCT 36.4 03/11/2014   MCV 91.7 03/11/2014   PLT 264 03/11/2014     Chemistry      Component Value Date/Time   NA 144 03/11/2014 1301  NA 143 12/10/2013 1150   K 4.1 03/11/2014 1301   K 3.7 12/10/2013 1150   CL 102 12/10/2013 1150   CO2 26 03/11/2014 1301   CO2 28 12/10/2013 1150   BUN 16.5 03/11/2014 1301   BUN 23 12/10/2013 1150   CREATININE 0.9 03/11/2014 1301   CREATININE 0.99 12/10/2013 1150      Component Value Date/Time   CALCIUM 10.5* 03/11/2014 1301   CALCIUM 9.6 12/10/2013 1150   ALKPHOS 76 03/11/2014 1301   ALKPHOS 60 12/10/2013 1150   AST 21 03/11/2014 1301   AST 26 12/10/2013 1150   ALT 28 03/11/2014 1301   ALT 39* 12/10/2013 1150   BILITOT 0.41 03/11/2014 1301   BILITOT 0.3 12/10/2013 1150      Assessment and Plan: Wendy [name] 73 y.o. female with  1. Stage IIB ER/PR positive invasive ductal carcinoma of the left breast.  She has underwent left mastectomy and began adjuvant chemotherapy consisting of Taxotere and Cytoxan on 12/17/13.  She is currently cycle 5 day 1 of chemotherapy.  She will proceed with treatment today.  Her CBC is stable, I reviewed this with her in detail. Her CMP is pending.    2. Nail dyscrasia.  She will continue to moisturize her nails and use cuticle cream BID.  She couldn't tolerate soaking her nails in tea tree oil.    The patient will return tomorrow for Neulasta and in 1 week for labs and evaluation for chemotoxicities.  She knows to call us in the interim should she have any questions or concerns.  We can certainly see  her sooner if needed.   I spent 25 minutes counseling the patient face to face.  The total time spent in the appointment was 30 minutes.  Wendy [name], [place] [phone] 03/13/2014 9:29 AM

## 2014-03-11 NOTE — Patient Instructions (Signed)
[facility] Discharge Instructions for Patients Receiving Chemotherapy  Today you received the following chemotherapy agents taxotere/cytoxan.   To help prevent nausea and vomiting after your treatment, we encourage you to take your nausea medication as directed.    If you develop nausea and vomiting that is not controlled by your nausea medication, call the clinic.   BELOW ARE SYMPTOMS THAT SHOULD BE REPORTED IMMEDIATELY:  *FEVER GREATER THAN 100.5 F  *CHILLS WITH OR WITHOUT FEVER  NAUSEA AND VOMITING THAT IS NOT CONTROLLED WITH YOUR NAUSEA MEDICATION  *UNUSUAL SHORTNESS OF BREATH  *UNUSUAL BRUISING OR BLEEDING  TENDERNESS IN MOUTH AND THROAT WITH OR WITHOUT PRESENCE OF ULCERS  *URINARY PROBLEMS  *BOWEL PROBLEMS  UNUSUAL RASH Items with * indicate a potential emergency and should be followed up as soon as possible.  Feel free to call the clinic you have any questions or concerns. The clinic phone number is [phone].

## 2014-03-12 ENCOUNTER — Ambulatory Visit (HOSPITAL_BASED_OUTPATIENT_CLINIC_OR_DEPARTMENT_OTHER): Payer: 59

## 2014-03-12 VITALS — BP 132/61 | HR 77 | Temp 97.6°F | Resp 18

## 2014-03-12 DIAGNOSIS — Z5189 Encounter for other specified aftercare: Secondary | ICD-10-CM

## 2014-03-12 DIAGNOSIS — C773 Secondary and unspecified malignant neoplasm of axilla and upper limb lymph nodes: Secondary | ICD-10-CM

## 2014-03-12 DIAGNOSIS — C50919 Malignant neoplasm of unspecified site of unspecified female breast: Secondary | ICD-10-CM

## 2014-03-12 MED ORDER — PEGFILGRASTIM INJECTION 6 MG/0.6ML
6.0000 mg | Freq: Once | SUBCUTANEOUS | Status: AC
  Administered 2014-03-12: 6 mg via SUBCUTANEOUS

## 2014-03-18 ENCOUNTER — Other Ambulatory Visit (HOSPITAL_BASED_OUTPATIENT_CLINIC_OR_DEPARTMENT_OTHER): Payer: 59

## 2014-03-18 ENCOUNTER — Ambulatory Visit (HOSPITAL_BASED_OUTPATIENT_CLINIC_OR_DEPARTMENT_OTHER): Payer: 59 | Admitting: Adult Health

## 2014-03-18 ENCOUNTER — Ambulatory Visit (HOSPITAL_BASED_OUTPATIENT_CLINIC_OR_DEPARTMENT_OTHER): Payer: 59

## 2014-03-18 ENCOUNTER — Encounter: Payer: Self-pay | Admitting: Adult Health

## 2014-03-18 VITALS — BP 113/68 | HR 92 | Temp 98.5°F | Resp 18 | Ht 68.0 in | Wt 181.3 lb

## 2014-03-18 VITALS — BP 122/51 | HR 88 | Temp 97.5°F | Resp 18

## 2014-03-18 DIAGNOSIS — C50419 Malignant neoplasm of upper-outer quadrant of unspecified female breast: Secondary | ICD-10-CM

## 2014-03-18 DIAGNOSIS — R110 Nausea: Secondary | ICD-10-CM

## 2014-03-18 DIAGNOSIS — C50919 Malignant neoplasm of unspecified site of unspecified female breast: Secondary | ICD-10-CM

## 2014-03-18 DIAGNOSIS — C50412 Malignant neoplasm of upper-outer quadrant of left female breast: Secondary | ICD-10-CM

## 2014-03-18 DIAGNOSIS — C773 Secondary and unspecified malignant neoplasm of axilla and upper limb lymph nodes: Secondary | ICD-10-CM

## 2014-03-18 DIAGNOSIS — D702 Other drug-induced agranulocytosis: Secondary | ICD-10-CM

## 2014-03-18 DIAGNOSIS — E860 Dehydration: Secondary | ICD-10-CM

## 2014-03-18 LAB — CBC WITH DIFFERENTIAL/PLATELET
BASO%: 0.9 % (ref 0.0–2.0)
Basophils Absolute: 0 10*3/uL (ref 0.0–0.1)
EOS%: 2.3 % (ref 0.0–7.0)
Eosinophils Absolute: 0.1 10*3/uL (ref 0.0–0.5)
HCT: 36.6 % (ref 34.8–46.6)
HGB: 12 g/dL (ref 11.6–15.9)
LYMPH%: 25 % (ref 14.0–49.7)
MCH: 30.1 pg (ref 25.1–34.0)
MCHC: 32.8 g/dL (ref 31.5–36.0)
MCV: 91.7 fL (ref 79.5–101.0)
MONO#: 0.8 10*3/uL (ref 0.1–0.9)
MONO%: 35.5 % — ABNORMAL HIGH (ref 0.0–14.0)
NEUT#: 0.8 10*3/uL — ABNORMAL LOW (ref 1.5–6.5)
NEUT%: 36.3 % — ABNORMAL LOW (ref 38.4–76.8)
Platelets: 161 10*3/uL (ref 145–400)
RBC: 3.99 10*6/uL (ref 3.70–5.45)
RDW: 17.5 % — ABNORMAL HIGH (ref 11.2–14.5)
WBC: 2.2 10*3/uL — ABNORMAL LOW (ref 3.9–10.3)
lymph#: 0.6 10*3/uL — ABNORMAL LOW (ref 0.9–3.3)
nRBC: 1 % — ABNORMAL HIGH (ref 0–0)

## 2014-03-18 LAB — COMPREHENSIVE METABOLIC PANEL (CC13)
ALT: 28 U/L (ref 0–55)
AST: 20 U/L (ref 5–34)
Albumin: 3.5 g/dL (ref 3.5–5.0)
Alkaline Phosphatase: 88 U/L (ref 40–150)
Anion Gap: 11 mEq/L (ref 3–11)
BUN: 21 mg/dL (ref 7.0–26.0)
CO2: 24 mEq/L (ref 22–29)
Calcium: 9.3 mg/dL (ref 8.4–10.4)
Chloride: 107 mEq/L (ref 98–109)
Creatinine: 1 mg/dL (ref 0.6–1.1)
Glucose: 132 mg/dl (ref 70–140)
Potassium: 3.7 mEq/L (ref 3.5–5.1)
Sodium: 142 mEq/L (ref 136–145)
Total Bilirubin: 0.69 mg/dL (ref 0.20–1.20)
Total Protein: 5.9 g/dL — ABNORMAL LOW (ref 6.4–8.3)

## 2014-03-18 MED ORDER — SODIUM CHLORIDE 0.9 % IJ SOLN
10.0000 mL | INTRAMUSCULAR | Status: DC | PRN
  Administered 2014-03-18: 10 mL via INTRAVENOUS
  Filled 2014-03-18: qty 10

## 2014-03-18 MED ORDER — ONDANSETRON 16 MG/50ML IVPB (CHCC)
16.0000 mg | Freq: Once | INTRAVENOUS | Status: AC
  Administered 2014-03-18: 16 mg via INTRAVENOUS

## 2014-03-18 MED ORDER — CIPROFLOXACIN HCL 500 MG PO TABS: 500.0000 mg | ORAL_TABLET | Freq: Two times a day (BID) | ORAL | Status: DC

## 2014-03-18 MED ORDER — ONDANSETRON 16 MG/50ML IVPB (CHCC)
INTRAVENOUS | Status: AC
  Filled 2014-03-18: qty 16

## 2014-03-18 MED ORDER — HEPARIN SOD (PORK) LOCK FLUSH 100 UNIT/ML IV SOLN
500.0000 [IU] | Freq: Once | INTRAVENOUS | Status: AC
  Administered 2014-03-18: 500 [IU] via INTRAVENOUS
  Filled 2014-03-18: qty 5

## 2014-03-18 MED ORDER — SODIUM CHLORIDE 0.9 % IV SOLN
Freq: Once | INTRAVENOUS | Status: AC
  Administered 2014-03-18: 13:00:00 via INTRAVENOUS

## 2014-03-18 NOTE — Patient Instructions (Signed)
  Patient Neutropenia Instruction Sheet  Diagnosis: Breast Cancer      Treating Physician: Marcy Panning, MD  Treatment: 1. Type of chemotherapy: Taxotere, Cytoxan 2. Date of last treatment: 03/11/14  Last Blood Counts: Lab Results  Component Value Date   WBC 2.2* 03/18/2014   HGB 12.0 03/18/2014   HCT 36.6 03/18/2014   MCV 91.7 03/18/2014   PLT 161 03/18/2014        Prophylactic Antibiotics: Cipro 500 mg by mouth twice a day Instructions: 1. Monitor temperature and call if fever  greater than 100.5, chills, shaking chills ([place]) 2. Call Physician on-call at [phone] 3. Give him/her symptoms and list of medications that you are taking and your last blood count.

## 2014-03-18 NOTE — Progress Notes (Signed)
Hematology and Oncology Follow Up Visit  ALENE BERGERSON [id number] Aug 10, 1941 73 y.o. 03/18/2014 12:22 PM     Principle Diagnosis:Laycie L Sonnenberg 73 y.o. 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     Prior Therapy:  1.  The patient palpated a mass in her left breast. Because of this she had a diagnostic mammogram and ultrasound performed on 10/27/2013. This showed a spiculated mass in the 6:00 position measuring approximately 3 cm. Ultrasound confirmed the hypoechoic mass at the 6:00 position measuring 2.6 x 2.1 x 1.5 cm. There was also noted to be a second mass in the 12:00 position measuring 2.1 x 1.8 x 2.1 cm. 2 abnormal lymph nodes were noted in the left axilla largest one measuring 1.2 cm. Patient had a biopsy of the primary breast masses. She also had a biopsy of the lymph node. All 3 biopsies were positive for invasive ductal carcinoma. Tumor was ER 100% PR 100% positive HER-2/neu negative with a proliferation marker Ki-67 low at 14% and low   #2 patient is status post left mastectomy Performed on 11/12/2013. The final pathology revealed:  Breast, modified radical mastectomy , left  - INVASIVE DUCTAL CARCINOMA.  - DUCTAL CARCINOMA IN SITU.  - MARGINS NOT INVOLVED.  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  - METASTATIC CARCINOMA IN 5 OF 10 LYMPH NODES (5/10).   #3 adjuvant chemotherapy consisting of Taxotere Cytoxan every 3 weeks for a total of 6 cycles curative intent beginning 12/17/2013    Current therapy:  Taxotere/Cytoxan cycle 5 day 8  Interim History: [name] 73 y.o. female with stage IIB ER/PR positive invasive ductal carcinoma of the left breast.  She receives Adjuvant Taxotere/Cytoxan given on day 1 of a 21 day cycle.  She receives Neulasta support on day 2.   Ms. Amero is here for evaluation on cycle 5 day 8 of taxotere and cytoxan.  She is feeling very dehydrated toeday.  She would  like IV fluids.  She has been nauseated, increased taste changes and has decreased PO intake due to this.  She is taking Compazine and queasy drops for her nausea.  The queasy drops in fact work for her nausea.  However, she still does feel weak and tiered, and has decreased PO intake.  Otherwise she is well and a 10 point ROS is neg.     Medications:  Current Outpatient Prescriptions  Medication Sig Dispense Refill  . aspirin EC 81 MG tablet Take 81 mg by mouth daily.      [name] atorvastatin (LIPITOR) 40 MG tablet Take 20 mg by mouth at bedtime.      . Calcium Carb-Cholecalciferol (CALCIUM 600 + D PO) Take 1 tablet by mouth daily.      [name] lidocaine-prilocaine (EMLA) cream Apply topically as needed (port).      . Multiple Vitamin (MULTIVITAMIN WITH MINERALS) TABS tablet Take 1 tablet by mouth daily.      . niacin (NIASPAN) 500 MG CR tablet Take 500 mg by mouth every other day. At bedtime      . omega-3 acid ethyl esters (LOVAZA) 1 G capsule Take 1 g by mouth daily.      . Potassium 99 MG TABS Take 1 tablet by mouth daily.      [name] UNABLE TO FIND Rx: L8000- Mastectomy Bra (Quantity: 2) Dx: 174.9; Left Mastectomy      . valsartan-hydrochlorothiazide (DIOVAN-HCT) 80-12.5 MG per tablet Take 1  tablet by mouth every morning.      [name] dexamethasone (DECADRON) 4 MG tablet Take 2 tablets (8 mg total) by mouth 2 (two) times daily with a meal. Take two times a day the day before Taxotere. Then take two times a day starting the day after chemo for 3 days.  30 tablet  1  . LORazepam (ATIVAN) 0.5 MG tablet Take 0.5 mg by mouth every 6 (six) hours as needed (Nausea or vomiting).      [name] NITROSTAT 0.4 MG SL tablet       . ondansetron (ZOFRAN) 8 MG tablet Take 8 mg by mouth 2 (two) times daily. Take two times a day starting the day after chemo for 3 days. Then take two times a day as needed for nausea or vomiting.      [name] oxyCODONE (OXY IR/ROXICODONE) 5 MG immediate release tablet       . oxyCODONE-acetaminophen  (PERCOCET) 10-325 MG per tablet Take 1 tablet by mouth every 6 (six) hours as needed for pain.  20 tablet  0  . prochlorperazine (COMPAZINE) 10 MG tablet Take 10 mg by mouth every 6 (six) hours as needed for nausea or vomiting.       No current facility-administered medications for this visit.     Allergies: No Known Allergies  Medical History: Past Medical History  Diagnosis Date  . Hypertension   . CAD (coronary artery disease)   . Hyperlipidemia   . Peripheral vascular disease     atherosclerosis carotid artery-mild  . Breast cancer     left breast    Surgical History:  Past Surgical History  Procedure Laterality Date  . Percutaneous coronary stent intervention (pci-s)    . Abdominal hysterectomy    . Nephrectomy living donor Left 2000  . Knee arthroscopy      knee  . Mastectomy modified radical Left 11/12/2013    Procedure: MASTECTOMY MODIFIED RADICAL;  Surgeon: Rolm Bookbinder, MD;  Location: [facility];  Service: General;  Laterality: Left;  . Portacath placement Right 12/16/2013    Procedure: INSERTION PORT-A-CATH;  Surgeon: Rolm Bookbinder, MD;  Location: [facility];  Service: General;  Laterality: Right;     Review of Systems: A 10 point review of systems was conducted and is otherwise negative except for what is noted above.     Physical Exam: Blood pressure 113/68, pulse 92, temperature 98.5 F (36.9 C), temperature source Oral, resp. rate 18, height _0  (1.727 m), weight 181 lb 4.8 oz (82.237 kg). GENERAL: Patient is a well appearing female in no acute distress HEENT:  Sclerae anicteric.  Oropharynx clear and slightly dry. No ulcerations or evidence of oropharyngeal candidiasis. Neck is supple.  NODES:  No cervical, supraclavicular, or axillary lymphadenopathy palpated.  BREAST EXAM:  Deferred. LUNGS:  Clear to auscultation bilaterally.  No wheezes or rhonchi. HEART:  Regular rate and rhythm. No murmur appreciated. ABDOMEN:  Soft, nontender.  Positive,  normoactive bowel sounds. No organomegaly palpated. MSK:  No focal spinal tenderness to palpation. Full range of motion bilaterally in the upper extremities. EXTREMITIES:  No peripheral edema.   SKIN:  Clear with no obvious rashes or skin changes. Slightly brittle fingernails.  No discoloration noted.  NEURO:  Nonfocal. Well oriented.  Appropriate affect. ECOG PERFORMANCE STATUS: 1 - Symptomatic but completely ambulatory   Lab Results: Lab Results  Component Value Date   WBC 2.2* 03/18/2014   HGB 12.0 03/18/2014   HCT 36.6 03/18/2014   MCV 91.7 03/18/2014  PLT 161 03/18/2014     Chemistry      Component Value Date/Time   NA 142 03/18/2014 1051   NA 143 12/10/2013 1150   K 3.7 03/18/2014 1051   K 3.7 12/10/2013 1150   CL 102 12/10/2013 1150   CO2 24 03/18/2014 1051   CO2 28 12/10/2013 1150   BUN 21.0 03/18/2014 1051   BUN 23 12/10/2013 1150   CREATININE 1.0 03/18/2014 1051   CREATININE 0.99 12/10/2013 1150      Component Value Date/Time   CALCIUM 9.3 03/18/2014 1051   CALCIUM 9.6 12/10/2013 1150   ALKPHOS 88 03/18/2014 1051   ALKPHOS 60 12/10/2013 1150   AST 20 03/18/2014 1051   AST 26 12/10/2013 1150   ALT 28 03/18/2014 1051   ALT 39* 12/10/2013 1150   BILITOT 0.69 03/18/2014 1051   BILITOT 0.3 12/10/2013 1150      Assessment and Plan: Drucilla Chalet Nile 73 y.o. female with  1. Stage IIB ER/PR positive invasive ductal carcinoma of the left breast.  She has underwent left mastectomy and began adjuvant chemotherapy consisting of Taxotere and Cytoxan on 12/17/13.  She is currently cycle 5 day 8.  Her labs are stable.  I reviewed them with her in detail.    2. Nail dyscrasia.  She will continue to moisturize her nails and use cuticle cream BID.  She couldn't tolerate soaking her nails in tea tree oil.    3. Dehydration and nausea:  The patient will receive IV fluids today and Zofran IV.  I reviewed her anti-emetics with her in detail.  I also encouraged increased PO intake.    The patient will  return in 2 weeks for labs, evaluation, and her final cycle of Taxotere/Cytoxan.  She knows to call us in the interim should she have any questions or concerns.  We can certainly see her sooner if needed.   I spent 25 minutes counseling the patient face to face.  The total time spent in the appointment was 30 minutes.  Minette Headland, [place] [phone] 03/18/2014 12:22 PM

## 2014-03-18 NOTE — Patient Instructions (Signed)
Dehydration, Adult Dehydration is when you lose more fluids from the body than you take in. Vital organs like the kidneys, brain, and heart cannot function without a proper amount of fluids and salt. Any loss of fluids from the body can cause dehydration.  CAUSES   Vomiting.  Diarrhea.  Excessive sweating.  Excessive urine output.  Fever. SYMPTOMS  Mild dehydration  Thirst.  Dry lips.  Slightly dry mouth. Moderate dehydration  Very dry mouth.  Sunken eyes.  Skin does not bounce back quickly when lightly pinched and released.  Dark urine and decreased urine production.  Decreased tear production.  Headache. Severe dehydration  Very dry mouth.  Extreme thirst.  Rapid, weak pulse (more than 100 beats per minute at rest).  Cold hands and feet.  Not able to sweat in spite of heat and temperature.  Rapid breathing.  Blue lips.  Confusion and lethargy.  Difficulty being awakened.  Minimal urine production.  No tears. DIAGNOSIS  Your caregiver will diagnose dehydration based on your symptoms and your exam. Blood and urine tests will help confirm the diagnosis. The diagnostic evaluation should also identify the cause of dehydration. TREATMENT  Treatment of mild or moderate dehydration can often be done at home by increasing the amount of fluids that you drink. It is best to drink small amounts of fluid more often. Drinking too much at one time can make vomiting worse. Refer to the home care instructions below. Severe dehydration needs to be treated at the hospital where you will probably be given intravenous (IV) fluids that contain water and electrolytes. HOME CARE INSTRUCTIONS   Ask your caregiver about specific rehydration instructions.  Drink enough fluids to keep your urine clear or pale yellow.  Drink small amounts frequently if you have nausea and vomiting.  Eat as you normally do.  Avoid:  Foods or drinks high in sugar.  Carbonated  drinks.  Juice.  Extremely hot or cold fluids.  Drinks with caffeine.  Fatty, greasy foods.  Alcohol.  Tobacco.  Overeating.  Gelatin desserts.  Wash your hands well to avoid spreading bacteria and viruses.  Only take over-the-counter or prescription medicines for pain, discomfort, or fever as directed by your caregiver.  Ask your caregiver if you should continue all prescribed and over-the-counter medicines.  Keep all follow-up appointments with your caregiver. SEEK MEDICAL CARE IF:  You have abdominal pain and it increases or stays in one area (localizes).  You have a rash, stiff neck, or severe headache.  You are irritable, sleepy, or difficult to awaken.  You are weak, dizzy, or extremely thirsty. SEEK IMMEDIATE MEDICAL CARE IF:   You are unable to keep fluids down or you get worse despite treatment.  You have frequent episodes of vomiting or diarrhea.  You have blood or green matter (bile) in your vomit.  You have blood in your stool or your stool looks black and tarry.  You have not urinated in 6 to 8 hours, or you have only urinated a small amount of very dark urine.  You have a fever.  You faint. MAKE SURE YOU:   Understand these instructions.  Will watch your condition.  Will get help right away if you are not doing well or get worse. Document Released: 11/11/2005 Document Revised: 02/03/2012 Document Reviewed: 07/01/2011 [employer] Patient Information 2014 [employer].  

## 2014-03-18 NOTE — Progress Notes (Signed)
Pt nearing end of IVF infusion. BP 122/51, pt denies lightheadedness or dizziness. Pt states "i don't feel near as yucky as i did when i came in." she tolerated food and drink while in infusion room. Patient discharged to home in stable condition. Pt instructed to call [facility] with any further questions or concerns. She verbalizes understanding.

## 2014-03-21 ENCOUNTER — Other Ambulatory Visit: Payer: Self-pay | Admitting: *Deleted

## 2014-04-01 ENCOUNTER — Other Ambulatory Visit (HOSPITAL_BASED_OUTPATIENT_CLINIC_OR_DEPARTMENT_OTHER): Payer: 59

## 2014-04-01 ENCOUNTER — Other Ambulatory Visit: Payer: Self-pay | Admitting: *Deleted

## 2014-04-01 ENCOUNTER — Encounter: Payer: Self-pay | Admitting: Adult Health

## 2014-04-01 ENCOUNTER — Other Ambulatory Visit: Payer: Self-pay | Admitting: Oncology

## 2014-04-01 ENCOUNTER — Ambulatory Visit (HOSPITAL_BASED_OUTPATIENT_CLINIC_OR_DEPARTMENT_OTHER): Payer: 59 | Admitting: Adult Health

## 2014-04-01 ENCOUNTER — Ambulatory Visit (HOSPITAL_BASED_OUTPATIENT_CLINIC_OR_DEPARTMENT_OTHER): Payer: 59

## 2014-04-01 VITALS — BP 144/68 | HR 78 | Temp 97.8°F | Resp 18 | Ht 68.0 in | Wt 186.1 lb

## 2014-04-01 DIAGNOSIS — C773 Secondary and unspecified malignant neoplasm of axilla and upper limb lymph nodes: Secondary | ICD-10-CM

## 2014-04-01 DIAGNOSIS — C50919 Malignant neoplasm of unspecified site of unspecified female breast: Secondary | ICD-10-CM

## 2014-04-01 DIAGNOSIS — Z5111 Encounter for antineoplastic chemotherapy: Secondary | ICD-10-CM

## 2014-04-01 DIAGNOSIS — C50412 Malignant neoplasm of upper-outer quadrant of left female breast: Secondary | ICD-10-CM

## 2014-04-01 DIAGNOSIS — Z170 Estrogen receptor positive status [ER+]: Secondary | ICD-10-CM

## 2014-04-01 LAB — CBC WITH DIFFERENTIAL/PLATELET
BASO%: 0.6 % (ref 0.0–2.0)
Basophils Absolute: 0 10*3/uL (ref 0.0–0.1)
EOS%: 1.7 % (ref 0.0–7.0)
Eosinophils Absolute: 0.1 10*3/uL (ref 0.0–0.5)
HCT: 36.3 % (ref 34.8–46.6)
HGB: 11.6 g/dL (ref 11.6–15.9)
LYMPH%: 16.3 % (ref 14.0–49.7)
MCH: 30.1 pg (ref 25.1–34.0)
MCHC: 32 g/dL (ref 31.5–36.0)
MCV: 94.3 fL (ref 79.5–101.0)
MONO#: 0.8 10*3/uL (ref 0.1–0.9)
MONO%: 14.2 % — ABNORMAL HIGH (ref 0.0–14.0)
NEUT#: 3.6 10*3/uL (ref 1.5–6.5)
NEUT%: 67.2 % (ref 38.4–76.8)
Platelets: 182 10*3/uL (ref 145–400)
RBC: 3.85 10*6/uL (ref 3.70–5.45)
RDW: 18.6 % — ABNORMAL HIGH (ref 11.2–14.5)
WBC: 5.4 10*3/uL (ref 3.9–10.3)
lymph#: 0.9 10*3/uL (ref 0.9–3.3)

## 2014-04-01 LAB — COMPREHENSIVE METABOLIC PANEL (CC13)
ALT: 31 U/L (ref 0–55)
AST: 30 U/L (ref 5–34)
Albumin: 3.6 g/dL (ref 3.5–5.0)
Alkaline Phosphatase: 71 U/L (ref 40–150)
Anion Gap: 8 mEq/L (ref 3–11)
BUN: 13.5 mg/dL (ref 7.0–26.0)
CO2: 25 mEq/L (ref 22–29)
Calcium: 9.8 mg/dL (ref 8.4–10.4)
Chloride: 110 mEq/L — ABNORMAL HIGH (ref 98–109)
Creatinine: 0.9 mg/dL (ref 0.6–1.1)
Glucose: 91 mg/dl (ref 70–140)
Potassium: 3.9 mEq/L (ref 3.5–5.1)
Sodium: 143 mEq/L (ref 136–145)
Total Bilirubin: 0.53 mg/dL (ref 0.20–1.20)
Total Protein: 6.1 g/dL — ABNORMAL LOW (ref 6.4–8.3)

## 2014-04-01 MED ORDER — DOCETAXEL CHEMO INJECTION 160 MG/16ML
75.0000 mg/m2 | Freq: Once | INTRAVENOUS | Status: AC
  Administered 2014-04-01: 160 mg via INTRAVENOUS
  Filled 2014-04-01: qty 16

## 2014-04-01 MED ORDER — SODIUM CHLORIDE 0.9 % IV SOLN
Freq: Once | INTRAVENOUS | Status: AC
  Administered 2014-04-01: 13:00:00 via INTRAVENOUS

## 2014-04-01 MED ORDER — ONDANSETRON 16 MG/50ML IVPB (CHCC)
INTRAVENOUS | Status: AC
  Filled 2014-04-01: qty 16

## 2014-04-01 MED ORDER — SODIUM CHLORIDE 0.9 % IV SOLN
600.0000 mg/m2 | Freq: Once | INTRAVENOUS | Status: AC
  Administered 2014-04-01: 1240 mg via INTRAVENOUS
  Filled 2014-04-01: qty 62

## 2014-04-01 MED ORDER — DEXAMETHASONE SODIUM PHOSPHATE 20 MG/5ML IJ SOLN
20.0000 mg | Freq: Once | INTRAMUSCULAR | Status: AC
  Administered 2014-04-01: 20 mg via INTRAVENOUS

## 2014-04-01 MED ORDER — DEXAMETHASONE SODIUM PHOSPHATE 20 MG/5ML IJ SOLN
INTRAMUSCULAR | Status: AC
  Filled 2014-04-01: qty 5

## 2014-04-01 MED ORDER — ONDANSETRON 16 MG/50ML IVPB (CHCC)
16.0000 mg | Freq: Once | INTRAVENOUS | Status: AC
  Administered 2014-04-01: 16 mg via INTRAVENOUS

## 2014-04-01 MED ORDER — HEPARIN SOD (PORK) LOCK FLUSH 100 UNIT/ML IV SOLN
500.0000 [IU] | Freq: Once | INTRAVENOUS | Status: AC | PRN
  Administered 2014-04-01: 500 [IU]
  Filled 2014-04-01: qty 5

## 2014-04-01 MED ORDER — SODIUM CHLORIDE 0.9 % IJ SOLN
10.0000 mL | INTRAMUSCULAR | Status: DC | PRN
  Administered 2014-04-01: 10 mL
  Filled 2014-04-01: qty 10

## 2014-04-01 NOTE — Progress Notes (Signed)
Hematology and Oncology Follow Up Visit  Wendy [name] [id number] [birth year] 73 y.o. 04/05/2014 10:23 PM     Principal Diagnosis:Wendy [name] 73 y.o. 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     Prior Therapy:  1.  The patient palpated a mass in her left breast. Because of this she had a diagnostic mammogram and ultrasound performed on 10/27/2013. This showed a spiculated mass in the 6:00 position measuring approximately 3 cm. Ultrasound confirmed the hypoechoic mass at the 6:00 position measuring 2.6 x 2.1 x 1.5 cm. There was also noted to be a second mass in the 12:00 position measuring 2.1 x 1.8 x 2.1 cm. 2 abnormal lymph nodes were noted in the left axilla largest one measuring 1.2 cm. Patient had a biopsy of the primary breast masses. She also had a biopsy of the lymph node. All 3 biopsies were positive for invasive ductal carcinoma. Tumor was ER 100% PR 100% positive HER-2/neu negative with a proliferation marker Ki-67 low at 14% and low   #2 patient is status post left mastectomy Performed on 11/12/2013. The final pathology revealed:  Breast, modified radical mastectomy , left  - INVASIVE DUCTAL CARCINOMA.  - DUCTAL CARCINOMA IN SITU.  - MARGINS NOT INVOLVED.  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  - METASTATIC CARCINOMA IN 5 OF 10 LYMPH NODES (5/10).   #3 adjuvant chemotherapy consisting of Taxotere Cytoxan every 3 weeks for a total of 6 cycles curative intent beginning 12/17/2013    Current therapy:  Taxotere/Cytoxan cycle 6 day 1  Interim History: Wendy [name] 73 y.o. female with stage IIB ER/PR positive invasive ductal carcinoma of the left breast.  She receives Adjuvant Taxotere/Cytoxan given on day 1 of a 21 day cycle.  She receives Neulasta support on day 2.   [name] is here for evaluation on cycle 6 day 1 of taxotere and cytoxan. She is very happy to be proceeding with her final  cycle of chemotherapy today.  She denies fevers, chills, nausea, vomiting, constipation, diarrhea, numbness/tingling, skin changes, or any further concerns.  A 10 point ROS is neg.    Medications:  Current Outpatient Prescriptions  Medication Sig Dispense Refill  . aspirin EC 81 MG tablet Take 81 mg by mouth daily.      [name] atorvastatin (LIPITOR) 40 MG tablet Take 20 mg by mouth at bedtime.      . Calcium Carb-Cholecalciferol (CALCIUM 600 + D PO) Take 1 tablet by mouth daily.      [name] dexamethasone (DECADRON) 4 MG tablet Take 2 tablets (8 mg total) by mouth 2 (two) times daily with a meal. Take two times a day the day before Taxotere. Then take two times a day starting the day after chemo for 3 days.  30 tablet  1  . lidocaine-prilocaine (EMLA) cream Apply topically as needed (port).      . Multiple Vitamin (MULTIVITAMIN WITH MINERALS) TABS tablet Take 1 tablet by mouth daily.      . niacin (NIASPAN) 500 MG CR tablet Take 500 mg by mouth every other day. At bedtime      . omega-3 acid ethyl esters (LOVAZA) 1 G capsule Take 1 g by mouth daily.      . Potassium 99 MG TABS Take 1 tablet by mouth daily.      [name] UNABLE TO FIND Rx: L8000- Mastectomy Bra (Quantity: 2) Dx: 174.9; Left Mastectomy      .  LORazepam (ATIVAN) 0.5 MG tablet Take 0.5 mg by mouth every 6 (six) hours as needed (Nausea or vomiting).      [name] NITROSTAT 0.4 MG SL tablet       . ondansetron (ZOFRAN) 8 MG tablet Take 8 mg by mouth 2 (two) times daily. Take two times a day starting the day after chemo for 3 days. Then take two times a day as needed for nausea or vomiting.      [name] oxyCODONE (OXY IR/ROXICODONE) 5 MG immediate release tablet       . oxyCODONE-acetaminophen (PERCOCET) 10-325 MG per tablet Take 1 tablet by mouth every 6 (six) hours as needed for pain.  20 tablet  0  . prochlorperazine (COMPAZINE) 10 MG tablet Take 10 mg by mouth every 6 (six) hours as needed for nausea or vomiting.       No current facility-administered  medications for this visit.     Allergies: No Known Allergies  Medical History: Past Medical History  Diagnosis Date  . Hypertension   . CAD (coronary artery disease)   . Hyperlipidemia   . Peripheral vascular disease     atherosclerosis carotid artery-mild  . Breast cancer     left breast    Surgical History:  Past Surgical History  Procedure Laterality Date  . Percutaneous coronary stent intervention (pci-s)    . Abdominal hysterectomy    . Nephrectomy living donor Left 2000  . Knee arthroscopy      knee  . Mastectomy modified radical Left 11/12/2013    Procedure: MASTECTOMY MODIFIED RADICAL;  Surgeon: Rolm Bookbinder, MD;  Location: [facility];  Service: General;  Laterality: Left;  . Portacath placement Right 12/16/2013    Procedure: INSERTION PORT-A-CATH;  Surgeon: Rolm Bookbinder, MD;  Location: [facility];  Service: General;  Laterality: Right;     Review of Systems: A 10 point review of systems was conducted and is otherwise negative except for what is noted above.     Physical Exam: Blood pressure 144/68, pulse 78, temperature 97.8 F (36.6 C), temperature source Oral, resp. rate 18, height 5' 8"  (1.727 m), weight 186 lb 1.6 oz (84.414 kg). GENERAL: Patient is a well appearing female in no acute distress HEENT:  Sclerae anicteric.  Oropharynx clear and slightly dry. No ulcerations or evidence of oropharyngeal candidiasis. Neck is supple.  NODES:  No cervical, supraclavicular, or axillary lymphadenopathy palpated.  BREAST EXAM:  Deferred. LUNGS:  Clear to auscultation bilaterally.  No wheezes or rhonchi. HEART:  Regular rate and rhythm. No murmur appreciated. ABDOMEN:  Soft, nontender.  Positive, normoactive bowel sounds. No organomegaly palpated. MSK:  No focal spinal tenderness to palpation. Full range of motion bilaterally in the upper extremities. EXTREMITIES:  No peripheral edema.   SKIN:  Clear with no obvious rashes or skin changes. Slightly brittle  fingernails.  No discoloration noted.  NEURO:  Nonfocal. Well oriented.  Appropriate affect. ECOG PERFORMANCE STATUS: 1 - Symptomatic but completely ambulatory   Lab Results: Lab Results  Component Value Date   WBC 5.4 04/01/2014   HGB 11.6 04/01/2014   HCT 36.3 04/01/2014   MCV 94.3 04/01/2014   PLT 182 04/01/2014     Chemistry      Component Value Date/Time   NA 143 04/01/2014 1031   NA 143 12/10/2013 1150   K 3.9 04/01/2014 1031   K 3.7 12/10/2013 1150   CL 102 12/10/2013 1150   CO2 25 04/01/2014 1031   CO2 28 12/10/2013 1150  BUN 13.5 04/01/2014 1031   BUN 23 12/10/2013 1150   CREATININE 0.9 04/01/2014 1031   CREATININE 0.99 12/10/2013 1150      Component Value Date/Time   CALCIUM 9.8 04/01/2014 1031   CALCIUM 9.6 12/10/2013 1150   ALKPHOS 71 04/01/2014 1031   ALKPHOS 60 12/10/2013 1150   AST 30 04/01/2014 1031   AST 26 12/10/2013 1150   ALT 31 04/01/2014 1031   ALT 39* 12/10/2013 1150   BILITOT 0.53 04/01/2014 1031   BILITOT 0.3 12/10/2013 1150      Assessment and Plan: Wendy [name] 73 y.o. female with  1. Stage IIB ER/PR positive invasive ductal carcinoma of the left breast.  She has underwent left mastectomy and began adjuvant chemotherapy consisting of Taxotere and Cytoxan on 12/17/13.  She is currently cycle 6 day 1.  Her labs are stable today.  She will proceed with treatment.  The patient will return tomorrow for Neulasta and in one week for labs and evaluation.  She knows to call us in the interim should she have any questions or concerns.  We can certainly see her sooner if needed.   I spent 15 minutes counseling the patient face to face.  The total time spent in the appointment was 30 minutes.  Minette Headland, NP Medical Oncology [facility] [phone] 04/05/2014 10:23 PM

## 2014-04-01 NOTE — Patient Instructions (Signed)
[facility] Discharge Instructions for Patients Receiving Chemotherapy  Today you received the following chemotherapy agents taxotere/cytoxan.   To help prevent nausea and vomiting after your treatment, we encourage you to take your nausea medication as directed.    If you develop nausea and vomiting that is not controlled by your nausea medication, call the clinic.   BELOW ARE SYMPTOMS THAT SHOULD BE REPORTED IMMEDIATELY:  *FEVER GREATER THAN 100.5 F  *CHILLS WITH OR WITHOUT FEVER  NAUSEA AND VOMITING THAT IS NOT CONTROLLED WITH YOUR NAUSEA MEDICATION  *UNUSUAL SHORTNESS OF BREATH  *UNUSUAL BRUISING OR BLEEDING  TENDERNESS IN MOUTH AND THROAT WITH OR WITHOUT PRESENCE OF ULCERS  *URINARY PROBLEMS  *BOWEL PROBLEMS  UNUSUAL RASH Items with * indicate a potential emergency and should be followed up as soon as possible.  Feel free to call the clinic you have any questions or concerns. The clinic phone number is [phone].

## 2014-04-02 ENCOUNTER — Other Ambulatory Visit: Payer: Self-pay | Admitting: Hematology and Oncology

## 2014-04-02 ENCOUNTER — Ambulatory Visit (HOSPITAL_BASED_OUTPATIENT_CLINIC_OR_DEPARTMENT_OTHER): Payer: 59

## 2014-04-02 ENCOUNTER — Telehealth: Payer: Self-pay | Admitting: Adult Health

## 2014-04-02 VITALS — BP 126/58 | HR 99 | Temp 97.5°F | Resp 20

## 2014-04-02 DIAGNOSIS — Z5189 Encounter for other specified aftercare: Secondary | ICD-10-CM

## 2014-04-02 DIAGNOSIS — C773 Secondary and unspecified malignant neoplasm of axilla and upper limb lymph nodes: Secondary | ICD-10-CM

## 2014-04-02 DIAGNOSIS — C50919 Malignant neoplasm of unspecified site of unspecified female breast: Secondary | ICD-10-CM

## 2014-04-02 MED ORDER — PEGFILGRASTIM INJECTION 6 MG/0.6ML
6.0000 mg | Freq: Once | SUBCUTANEOUS | Status: AC
  Administered 2014-04-02: 6 mg via SUBCUTANEOUS

## 2014-04-07 ENCOUNTER — Ambulatory Visit
Admission: RE | Admit: 2014-04-07 | Discharge: 2014-04-07 | Disposition: A | Discharge status: Home / Self Care | Payer: Medicare Other | Source: Ambulatory Visit | Attending: Radiation Oncology | Admitting: Radiation Oncology

## 2014-04-07 ENCOUNTER — Encounter: Payer: Self-pay | Admitting: Radiation Oncology

## 2014-04-07 VITALS — BP 111/54 | HR 85 | Temp 98.3°F | Wt 181.5 lb

## 2014-04-07 DIAGNOSIS — Z510 Encounter for antineoplastic radiation therapy: Secondary | ICD-10-CM | POA: Diagnosis not present

## 2014-04-07 DIAGNOSIS — C50412 Malignant neoplasm of upper-outer quadrant of left female breast: Secondary | ICD-10-CM

## 2014-04-07 DIAGNOSIS — C50419 Malignant neoplasm of upper-outer quadrant of unspecified female breast: Secondary | ICD-10-CM | POA: Insufficient documentation

## 2014-04-07 HISTORY — DX: Acute myocardial infarction, unspecified: I21.9

## 2014-04-07 NOTE — Progress Notes (Signed)
Location of Breast Cancer:Left breast, invasive ductal ca @ 12:00.   Histology per Pathology Report: 11/12/13 Breast, modified radical mastectomy , left - INVASIVE DUCTAL CARCINOMA. - DUCTAL CARCINOMA IN SITU. - MARGINS NOT INVOLVED. 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 - METASTATIC CARCINOMA IN 5 OF 10 LYMPH NODES (5/10). Microscopic Comment BREAST, INVASIVE  Receptor Status: ER(+), PR (+), Her2-neu (-)  Did patient present with symptoms (if so, please note symptoms) or was this found on screening mammography?:Patient palpated nodule during self exam.  Past/Anticipated interventions by surgeon, if WVP:XTGG  Modified radical mastectomy on 11/12/13 by Dr.Wakefield.  Past/Anticipated interventions by medical oncology, if any: Chemotherapy:Last chemotherpy treatment on Apr 01, 2014.  Lymphedema issues, if any:No  Pain issues, if any:No  SAFETY ISSUES:  Prior radiation?No  Pacemaker/ICD?No  Possible current pregnancy?No  Is the patient on methotrexate? No  Current Complaints / other details: Single, menarche age age 73.First full term birth at age 70.Hysterectomy.Took premarin about 6 months .3 vaginal birhts.    Stewart Sasaki Edison Nasuti, RN 04/07/2014,10:36 AM

## 2014-04-07 NOTE — Progress Notes (Signed)
Department of Radiation Oncology  Phone:  [phone] Fax:        [phone]   Name: Wendy [name] MRN: [id number]  DOB: 03/09/1941  Date: 04/07/2014  Follow Up Visit Note  Diagnosis: T3N2 Invasive Ductal Carcinoma of the Left Breast  Interval History: Wendy [name] presents today for routine followup.  She has done well with chemotherapy. She received her dose last week. She states for the week after her chemotherapy she feels pretty down and then is back to normal by the third week. She is ready to proceed on with radiation. She has good range of motion of her left arm. She has no lymphedema. She would like to proceed forward as soon as possible.  Allergies: No Known Allergies  Medications:  Current Outpatient Prescriptions  Medication Sig Dispense Refill  . aspirin EC 81 MG tablet Take 81 mg by mouth daily.      [name] atorvastatin (LIPITOR) 40 MG tablet Take 20 mg by mouth at bedtime.      . Calcium Carb-Cholecalciferol (CALCIUM 600 + D PO) Take 1 tablet by mouth daily.      [name] lidocaine-prilocaine (EMLA) cream Apply topically as needed (port).      . LORazepam (ATIVAN) 0.5 MG tablet Take 0.5 mg by mouth every 6 (six) hours as needed (Nausea or vomiting).      . Multiple Vitamin (MULTIVITAMIN WITH MINERALS) TABS tablet Take 1 tablet by mouth daily.      . niacin (NIASPAN) 500 MG CR tablet Take 500 mg by mouth every other day. At bedtime      . NITROSTAT 0.4 MG SL tablet       . omega-3 acid ethyl esters (LOVAZA) 1 G capsule Take 1 g by mouth daily.      . ondansetron (ZOFRAN) 8 MG tablet Take 8 mg by mouth 2 (two) times daily. Take two times a day starting the day after chemo for 3 days. Then take two times a day as needed for nausea or vomiting.      . prochlorperazine (COMPAZINE) 10 MG tablet Take 10 mg by mouth every 6 (six) hours as needed for nausea or vomiting.      [name] dexamethasone (DECADRON) 4 MG tablet Take 2 tablets (8 mg total) by mouth 2 (two) times daily with a meal. Take  two times a day the day before Taxotere. Then take two times a day starting the day after chemo for 3 days.  30 tablet  1  . oxyCODONE (OXY IR/ROXICODONE) 5 MG immediate release tablet       . oxyCODONE-acetaminophen (PERCOCET) 10-325 MG per tablet Take 1 tablet by mouth every 6 (six) hours as needed for pain.  20 tablet  0  . Potassium 99 MG TABS Take 1 tablet by mouth daily.      [name] UNABLE TO FIND Rx: L8000- Mastectomy Bra (Quantity: 2) Dx: 174.9; Left Mastectomy       No current facility-administered medications for this encounter.    Physical Exam:  Filed Vitals:   04/07/14 1028  BP: 111/54  Pulse: 85  Temp: 98.3 F (36.8 C)  Weight: 181 lb 8 oz (82.328 kg)   pleasant female in no distress sitting comfortably on examining room chair  IMPRESSION: Wendy [name] is a 73 y.o. female with postmastectomy radiation indications including tumor size greater than 5 cm and multiple positive lymph nodes  PLAN:  I discussed with Wendy [name] the indications for radiation in postmastectomy setting. We discussed the process  of simulation the placement tattoos. We discussed 6 weeks of treatment as an outpatient. We discussed treatment of the left chest wall and left supraclavicular fossa. We discussed skin redness and fatigue as possible side effects. We discussed the use of breath will technique for cardiac sparing. We discussed the increased complications that can be seen after radiation if the patient chooses reconstruction. She is not interested in that at this time. I will go ahead and set her up for simulation the couple weeks to give her time to recover from her last dose of chemotherapy. She has not yet decided whether she would like to wait and begin work or if she would like to go back at this time. I encouraged her to just wait and see how she did with treatment and she can always go back part-time.Wendy Silversmith, MD

## 2014-04-07 NOTE — Progress Notes (Signed)
Please see the Nurse Progress Note in the MD Initial Consult Encounter for this patient. 

## 2014-04-08 ENCOUNTER — Ambulatory Visit (HOSPITAL_BASED_OUTPATIENT_CLINIC_OR_DEPARTMENT_OTHER): Payer: 59

## 2014-04-08 ENCOUNTER — Telehealth: Payer: Self-pay | Admitting: Adult Health

## 2014-04-08 ENCOUNTER — Ambulatory Visit (HOSPITAL_BASED_OUTPATIENT_CLINIC_OR_DEPARTMENT_OTHER): Payer: 59 | Admitting: Adult Health

## 2014-04-08 ENCOUNTER — Encounter: Payer: Self-pay | Admitting: Adult Health

## 2014-04-08 ENCOUNTER — Other Ambulatory Visit (HOSPITAL_BASED_OUTPATIENT_CLINIC_OR_DEPARTMENT_OTHER): Payer: 59

## 2014-04-08 VITALS — BP 108/66 | HR 90 | Temp 98.3°F | Resp 18 | Ht 68.0 in | Wt 181.2 lb

## 2014-04-08 DIAGNOSIS — Z9010 Acquired absence of unspecified breast and nipple: Secondary | ICD-10-CM

## 2014-04-08 DIAGNOSIS — C50919 Malignant neoplasm of unspecified site of unspecified female breast: Secondary | ICD-10-CM

## 2014-04-08 DIAGNOSIS — R110 Nausea: Secondary | ICD-10-CM

## 2014-04-08 DIAGNOSIS — C50412 Malignant neoplasm of upper-outer quadrant of left female breast: Secondary | ICD-10-CM

## 2014-04-08 DIAGNOSIS — E860 Dehydration: Secondary | ICD-10-CM

## 2014-04-08 DIAGNOSIS — Z170 Estrogen receptor positive status [ER+]: Secondary | ICD-10-CM

## 2014-04-08 LAB — CBC WITH DIFFERENTIAL/PLATELET
BASO%: 0.8 % (ref 0.0–2.0)
Basophils Absolute: 0 10*3/uL (ref 0.0–0.1)
EOS%: 1.8 % (ref 0.0–7.0)
Eosinophils Absolute: 0 10*3/uL (ref 0.0–0.5)
HCT: 36.5 % (ref 34.8–46.6)
HGB: 12 g/dL (ref 11.6–15.9)
LYMPH%: 23.1 % (ref 14.0–49.7)
MCH: 30.4 pg (ref 25.1–34.0)
MCHC: 32.8 g/dL (ref 31.5–36.0)
MCV: 92.6 fL (ref 79.5–101.0)
MONO#: 1 10*3/uL — ABNORMAL HIGH (ref 0.1–0.9)
MONO%: 39.7 % — ABNORMAL HIGH (ref 0.0–14.0)
NEUT#: 0.9 10*3/uL — ABNORMAL LOW (ref 1.5–6.5)
NEUT%: 34.6 % — ABNORMAL LOW (ref 38.4–76.8)
Platelets: 151 10*3/uL (ref 145–400)
RBC: 3.94 10*6/uL (ref 3.70–5.45)
RDW: 17.5 % — ABNORMAL HIGH (ref 11.2–14.5)
WBC: 2.6 10*3/uL — ABNORMAL LOW (ref 3.9–10.3)
lymph#: 0.6 10*3/uL — ABNORMAL LOW (ref 0.9–3.3)

## 2014-04-08 LAB — COMPREHENSIVE METABOLIC PANEL (CC13)
ALT: 25 U/L (ref 0–55)
AST: 20 U/L (ref 5–34)
Albumin: 3.6 g/dL (ref 3.5–5.0)
Alkaline Phosphatase: 83 U/L (ref 40–150)
Anion Gap: 10 mEq/L (ref 3–11)
BUN: 15.6 mg/dL (ref 7.0–26.0)
CO2: 25 mEq/L (ref 22–29)
Calcium: 10.1 mg/dL (ref 8.4–10.4)
Chloride: 105 mEq/L (ref 98–109)
Creatinine: 0.9 mg/dL (ref 0.6–1.1)
Glucose: 99 mg/dl (ref 70–140)
Potassium: 4.1 mEq/L (ref 3.5–5.1)
Sodium: 140 mEq/L (ref 136–145)
Total Bilirubin: 0.82 mg/dL (ref 0.20–1.20)
Total Protein: 6 g/dL — ABNORMAL LOW (ref 6.4–8.3)

## 2014-04-08 MED ORDER — ONDANSETRON 16 MG/50ML IVPB (CHCC)
INTRAVENOUS | Status: AC
  Filled 2014-04-08: qty 16

## 2014-04-08 MED ORDER — ONDANSETRON 16 MG/50ML IVPB (CHCC)
16.0000 mg | Freq: Once | INTRAVENOUS | Status: AC
  Administered 2014-04-08: 16 mg via INTRAVENOUS

## 2014-04-08 MED ORDER — SODIUM CHLORIDE 0.9 % IJ SOLN
10.0000 mL | INTRAMUSCULAR | Status: DC | PRN
  Administered 2014-04-08: 10 mL via INTRAVENOUS
  Filled 2014-04-08: qty 10

## 2014-04-08 MED ORDER — HEPARIN SOD (PORK) LOCK FLUSH 100 UNIT/ML IV SOLN
500.0000 [IU] | Freq: Once | INTRAVENOUS | Status: AC
  Administered 2014-04-08: 500 [IU] via INTRAVENOUS
  Filled 2014-04-08: qty 5

## 2014-04-08 MED ORDER — SODIUM CHLORIDE 0.9 % IV SOLN
Freq: Once | INTRAVENOUS | Status: AC
  Administered 2014-04-08: 14:00:00 via INTRAVENOUS

## 2014-04-08 NOTE — Telephone Encounter (Signed)
per pof to sch appt-gave pt copy of sch °

## 2014-04-08 NOTE — Patient Instructions (Signed)
Dehydration, Adult Dehydration is when you lose more fluids from the body than you take in. Vital organs like the kidneys, brain, and heart cannot function without a proper amount of fluids and salt. Any loss of fluids from the body can cause dehydration.  CAUSES   Vomiting.  Diarrhea.  Excessive sweating.  Excessive urine output.  Fever. SYMPTOMS  Mild dehydration  Thirst.  Dry lips.  Slightly dry mouth. Moderate dehydration  Very dry mouth.  Sunken eyes.  Skin does not bounce back quickly when lightly pinched and released.  Dark urine and decreased urine production.  Decreased tear production.  Headache. Severe dehydration  Very dry mouth.  Extreme thirst.  Rapid, weak pulse (more than 100 beats per minute at rest).  Cold hands and feet.  Not able to sweat in spite of heat and temperature.  Rapid breathing.  Blue lips.  Confusion and lethargy.  Difficulty being awakened.  Minimal urine production.  No tears. DIAGNOSIS  Your caregiver will diagnose dehydration based on your symptoms and your exam. Blood and urine tests will help confirm the diagnosis. The diagnostic evaluation should also identify the cause of dehydration. TREATMENT  Treatment of mild or moderate dehydration can often be done at home by increasing the amount of fluids that you drink. It is best to drink small amounts of fluid more often. Drinking too much at one time can make vomiting worse. Refer to the home care instructions below. Severe dehydration needs to be treated at the hospital where you will probably be given intravenous (IV) fluids that contain water and electrolytes. HOME CARE INSTRUCTIONS   Ask your caregiver about specific rehydration instructions.  Drink enough fluids to keep your urine clear or pale yellow.  Drink small amounts frequently if you have nausea and vomiting.  Eat as you normally do.  Avoid:  Foods or drinks high in sugar.  Carbonated  drinks.  Juice.  Extremely hot or cold fluids.  Drinks with caffeine.  Fatty, greasy foods.  Alcohol.  Tobacco.  Overeating.  Gelatin desserts.  Wash your hands well to avoid spreading bacteria and viruses.  Only take over-the-counter or prescription medicines for pain, discomfort, or fever as directed by your caregiver.  Ask your caregiver if you should continue all prescribed and over-the-counter medicines.  Keep all follow-up appointments with your caregiver. SEEK MEDICAL CARE IF:  You have abdominal pain and it increases or stays in one area (localizes).  You have a rash, stiff neck, or severe headache.  You are irritable, sleepy, or difficult to awaken.  You are weak, dizzy, or extremely thirsty. SEEK IMMEDIATE MEDICAL CARE IF:   You are unable to keep fluids down or you get worse despite treatment.  You have frequent episodes of vomiting or diarrhea.  You have blood or green matter (bile) in your vomit.  You have blood in your stool or your stool looks black and tarry.  You have not urinated in 6 to 8 hours, or you have only urinated a small amount of very dark urine.  You have a fever.  You faint. MAKE SURE YOU:   Understand these instructions.  Will watch your condition.  Will get help right away if you are not doing well or get worse. Document Released: 11/11/2005 Document Revised: 02/03/2012 Document Reviewed: 07/01/2011 [employer] Patient Information 2014 [employer].  

## 2014-04-08 NOTE — Progress Notes (Signed)
Hematology and Oncology Follow Up Visit  Wendy [name] [id number] [birth year] 73 y.o. 04/09/2014 8:16 AM     Principal Diagnosis:Wendy [name] 73 y.o. 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     Prior Therapy:  1.  The patient palpated a mass in her left breast. Because of this she had a diagnostic mammogram and ultrasound performed on 10/27/2013. This showed a spiculated mass in the 6:00 position measuring approximately 3 cm. Ultrasound confirmed the hypoechoic mass at the 6:00 position measuring 2.6 x 2.1 x 1.5 cm. There was also noted to be a second mass in the 12:00 position measuring 2.1 x 1.8 x 2.1 cm. 2 abnormal lymph nodes were noted in the left axilla largest one measuring 1.2 cm. Patient had a biopsy of the primary breast masses. She also had a biopsy of the lymph node. All 3 biopsies were positive for invasive ductal carcinoma. Tumor was ER 100% PR 100% positive HER-2/neu negative with a proliferation marker Ki-67 low at 14% and low   #2 patient is status post left mastectomy Performed on 11/12/2013. The final pathology revealed:  Breast, modified radical mastectomy , left  - INVASIVE DUCTAL CARCINOMA.  - DUCTAL CARCINOMA IN SITU.  - MARGINS NOT INVOLVED.  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  - METASTATIC CARCINOMA IN 5 OF 10 LYMPH NODES (5/10).   #3 adjuvant chemotherapy consisting of Taxotere Cytoxan every 3 weeks for a total of 6 cycles curative intent beginning 12/17/2013    Current therapy:  Taxotere/Cytoxan cycle 6 day 8  Interim History: Wendy [name] 73 y.o. female with stage IIB ER/PR positive invasive ductal carcinoma of the left breast.  She receives Adjuvant Taxotere/Cytoxan given on day 1 of a 21 day cycle.  She receives Neulasta support on day 2.   [name] is here for evaluation on cycle 6 day 8 of taxotere and cytoxan. She is feeling weak, tired, and nauseated today.  She  has had a rough week.  She has had a decreased appetite and fluid intake as well.  Otherwise, she denies fevers, chills, constipation, diarrhea, numbness/tingling, or any further concerns.     Medications:  Current Outpatient Prescriptions  Medication Sig Dispense Refill  . aspirin EC 81 MG tablet Take 81 mg by mouth daily.      [name] atorvastatin (LIPITOR) 40 MG tablet Take 20 mg by mouth at bedtime.      . Calcium Carb-Cholecalciferol (CALCIUM 600 + D PO) Take 1 tablet by mouth daily.      . Multiple Vitamin (MULTIVITAMIN WITH MINERALS) TABS tablet Take 1 tablet by mouth daily.      . niacin (NIASPAN) 500 MG CR tablet Take 500 mg by mouth every other day. At bedtime      . omega-3 acid ethyl esters (LOVAZA) 1 G capsule Take 1 g by mouth daily.      . Potassium 99 MG TABS Take 1 tablet by mouth daily.      [name] UNABLE TO FIND Rx: L8000- Mastectomy Bra (Quantity: 2) Dx: 174.9; Left Mastectomy      . lidocaine-prilocaine (EMLA) cream Apply topically as needed (port).      . LORazepam (ATIVAN) 0.5 MG tablet Take 0.5 mg by mouth every 6 (six) hours as needed (Nausea or vomiting).      [name] NITROSTAT 0.4 MG SL tablet       . ondansetron (ZOFRAN) 8 MG tablet Take 8  mg by mouth 2 (two) times daily. Take two times a day starting the day after chemo for 3 days. Then take two times a day as needed for nausea or vomiting.      [name] oxyCODONE (OXY IR/ROXICODONE) 5 MG immediate release tablet       . prochlorperazine (COMPAZINE) 10 MG tablet Take 10 mg by mouth every 6 (six) hours as needed for nausea or vomiting.       Current Facility-Administered Medications  Medication Dose Route Frequency Provider Last Rate Last Dose  . sodium chloride 0.9 % injection 10 mL  10 mL Intravenous PRN Ladell Pier, MD   10 mL at 04/08/14 1542     Allergies: No Known Allergies  Medical History: Past Medical History  Diagnosis Date  . Hypertension   . CAD (coronary artery disease)   . Hyperlipidemia   . Peripheral  vascular disease     atherosclerosis carotid artery-mild  . Breast cancer     left breast  . Myocardial infarction 11/22/04    Surgical History:  Past Surgical History  Procedure Laterality Date  . Percutaneous coronary stent intervention (pci-s)  2005  . Abdominal hysterectomy  1982  . 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 Knee arthroscopy      knee  . Mastectomy modified radical Left 11/12/2013    Procedure: MASTECTOMY MODIFIED RADICAL;  Surgeon: Rolm Bookbinder, MD;  Location: [facility];  Service: General;  Laterality: Left;  . Portacath placement Right 12/16/2013    Procedure: INSERTION PORT-A-CATH;  Surgeon: Rolm Bookbinder, MD;  Location: [facility];  Service: General;  Laterality: Right;     Review of Systems: A 10 point review of systems was conducted and is otherwise negative except for what is noted above.     Physical Exam: Blood pressure 108/66, pulse 90, temperature 98.3 F (36.8 C), temperature source Oral, resp. rate 18, height _0  (1.727 m), weight 181 lb 3.2 oz (82.192 kg). GENERAL: Patient is a well appearing female in no acute distress HEENT:  Sclerae anicteric.  Oropharynx clear and slightly dry. No ulcerations or evidence of oropharyngeal candidiasis. Neck is supple.  NODES:  No cervical, supraclavicular, or axillary lymphadenopathy palpated.  BREAST EXAM:  Deferred. LUNGS:  Clear to auscultation bilaterally.  No wheezes or rhonchi. HEART:  Regular rate and rhythm. No murmur appreciated. ABDOMEN:  Soft, nontender.  Positive, normoactive bowel sounds. No organomegaly palpated. MSK:  No focal spinal tenderness to palpation. Full range of motion bilaterally in the upper extremities. EXTREMITIES:  No peripheral edema.   SKIN:  Clear with no obvious rashes or skin changes. Slightly brittle fingernails.  No discoloration noted.  NEURO:  Nonfocal. Well oriented.  Appropriate affect. ECOG PERFORMANCE STATUS: 1 - Symptomatic but completely  ambulatory   Lab Results: Lab Results  Component Value Date   WBC 2.6* 04/08/2014   HGB 12.0 04/08/2014   HCT 36.5 04/08/2014   MCV 92.6 04/08/2014   PLT 151 04/08/2014     Chemistry      Component Value Date/Time   NA 140 04/08/2014 1053   NA 143 12/10/2013 1150   K 4.1 04/08/2014 1053   K 3.7 12/10/2013 1150   CL 102 12/10/2013 1150   CO2 25 04/08/2014 1053   CO2 28 12/10/2013 1150   BUN 15.6 04/08/2014 1053   BUN 23 12/10/2013 1150   CREATININE 0.9 04/08/2014 1053   CREATININE 0.99 12/10/2013 1150  Component Value Date/Time   CALCIUM 10.1 04/08/2014 1053   CALCIUM 9.6 12/10/2013 1150   ALKPHOS 83 04/08/2014 1053   ALKPHOS 60 12/10/2013 1150   AST 20 04/08/2014 1053   AST 26 12/10/2013 1150   ALT 25 04/08/2014 1053   ALT 39* 12/10/2013 1150   BILITOT 0.82 04/08/2014 1053   BILITOT 0.3 12/10/2013 1150      Assessment and Plan: Drucilla Chalet Sindoni 73 y.o. female with  1. Stage IIB ER/PR positive invasive ductal carcinoma of the left breast.  She has underwent 6 cycles of adjuvant taxotere and cytoxan and tolerated treatment well.  Her labs are stable, I reviewed them with her in detail.  She has seen Dr. Pablo Ledger in radiation oncology, and will proceed with adjuvant radiation.  Her CT simulation is planned for 05/15/14.   2. Dehydration:  The patient is subsequently dehydrated following treatment and she will receive IV fluids and anti-emetics today.    The patient will return in two to three months for labs and follow up.  She knows to call us in the interim should she have any questions or concerns.  We can certainly see her sooner if needed.   I spent 25 minutes counseling the patient face to face.  The total time spent in the appointment was 30 minutes.  Minette Headland, [place] [phone] 04/09/2014 8:16 AM

## 2014-04-24 NOTE — Addendum Note (Signed)
Encounter addended by: Deirdre Evener, RN on: 04/24/2014  9:05 PM<BR>     Documentation filed: Inpatient Patient Education

## 2014-05-03 ENCOUNTER — Ambulatory Visit
Admission: RE | Admit: 2014-05-03 | Discharge: 2014-05-03 | Disposition: A | Discharge status: Home / Self Care | Payer: Medicare Other | Source: Ambulatory Visit | Attending: Radiation Oncology | Admitting: Radiation Oncology

## 2014-05-03 DIAGNOSIS — Z510 Encounter for antineoplastic radiation therapy: Secondary | ICD-10-CM | POA: Diagnosis not present

## 2014-05-03 DIAGNOSIS — C50412 Malignant neoplasm of upper-outer quadrant of left female breast: Secondary | ICD-10-CM

## 2014-05-03 NOTE — Progress Notes (Signed)
Radiation Oncology         (336) [phone] ________________________________  Name: Wendy [name]      MRN: [id number]          Date: 05/03/2014              DOB: 1941-03-05  Optical Surface Tracking Plan:  Since intensity modulated radiotherapy (IMRT) and 3D conformal radiation treatment methods are predicated on accurate and precise positioning for treatment, intrafraction motion monitoring is medically necessary to ensure accurate and safe treatment delivery.  The ability to quantify intrafraction motion without excessive ionizing radiation dose can only be performed with optical surface tracking. Accordingly, surface imaging offers the opportunity to obtain 3D measurements of patient position throughout IMRT and 3D treatments without excessive radiation exposure.  I am ordering optical surface tracking for this patient's upcoming course of radiotherapy. ________________________________ Signature   Reference:   Ursula Alert, J, et al. Surface imaging-based analysis of intrafraction motion for breast radiotherapy patients.Journal of [place], n. 6, nov. 2014. ISSN [id number].   Available at: <http://www.jacmp.org/index.php/jacmp/article/view/4957>.

## 2014-05-03 NOTE — Progress Notes (Signed)
Name: KYLIEANN EAGLES   MRN: [id number]  Date:  05/03/2014  DOB: September 25, 1941  Status:outpatient   DIAGNOSIS: Left Breast cancer.  CONSENT VERIFIED: yes SET UP: Patient is setup supine  IMMOBILIZATION:  The following immobilization was used:Custom Moldable Pillow, breast board.  NARRATIVE: Ms. Bolinger was brought to the [facility].  Identity was confirmed.  All relevant records and images related to the planned course of therapy were reviewed.  Then, the patient was positioned in a stable reproducible clinical set-up for radiation therapy.  Wires were placed to delineate the clinical extent of breast tissue. A wire was placed on the scar as well.  CT images were obtained.  An isocenter was placed. Skin markings were placed.  The position of the heart was then analyzed.  Due to the proximity of the heart to the chest wall, I felt she would benefit from deep inspiration breath hold for cardiac sparing.  She was then coached and rescanned in the breath hold position.  Acceptable cardiac sparing was achieved. The CT images were loaded into the planning software where the target and avoidance structures were contoured.  The radiation prescription was entered and confirmed. The patient was discharged in stable condition and tolerated simulation well.    TREATMENT PLANNING NOTE/3D Simulation Note Treatment planning then occurred. I have requested : MLC's, isodose plan, basic dose calculation  3D simulation was performed.  I personally supervised and oversaw the construction of 5 medically necessary complex treatment devices in the form of MLCs which will be used for beam modification purposes and to protect critical structures including the heart and lung as well as the immobilization devices which will be used to ensure reproducible set up.  I have requested a dose volume histogram of the heart lung and tumor cavity.    RESPIRATORY MOTION MANAGEMENT SIMULATION - Deep Inspiration Breath  Hold  NARRATIVE:  In order to account for effect of respiratory motion on target structures and other organs in the planning and delivery of radiotherapy, this patient underwent respiratory motion management simulation.  To accomplish this, when the patient was brought to the CT simulation planning suite, a bellows was placed on the her abdomen.  Wave forms of the patient's breathing were obtained. Coaching was performed and practice sessions initiated to monitor her ability to obtain and maintain deep inspiration breath hold.  The CT images were loaded into the planning software and fused with her free breathing images by physics.  Acceptable cardiac sparing was achieved through the use of deep inspiration breath hold.  Planning will be performed on her breath hold scan

## 2014-05-05 DIAGNOSIS — Z510 Encounter for antineoplastic radiation therapy: Secondary | ICD-10-CM | POA: Diagnosis not present

## 2014-05-10 ENCOUNTER — Ambulatory Visit
Admission: RE | Admit: 2014-05-10 | Discharge: 2014-05-10 | Disposition: A | Discharge status: Home / Self Care | Payer: Medicare Other | Source: Ambulatory Visit | Attending: Radiation Oncology | Admitting: Radiation Oncology

## 2014-05-10 DIAGNOSIS — C50412 Malignant neoplasm of upper-outer quadrant of left female breast: Secondary | ICD-10-CM

## 2014-05-10 DIAGNOSIS — Z510 Encounter for antineoplastic radiation therapy: Secondary | ICD-10-CM | POA: Diagnosis not present

## 2014-05-10 NOTE — Progress Notes (Signed)
  Radiation Oncology         (336) [phone] ________________________________  Name: Wendy [name] MRN: [id number]  Date: 05/10/2014  DOB: 31-Dec-1940  Simulation Verification Note  Status: outpatient  NARRATIVE: The patient was brought to the treatment unit and placed in the planned treatment position. The clinical setup was verified. Then port films were obtained and uploaded to the radiation oncology medical record software.  The treatment beams were carefully compared against the planned radiation fields. The position location and shape of the radiation fields was reviewed. The targeted volume of tissue appears appropriately covered by the radiation beams. Organs at risk appear to be excluded as planned.  Based on my personal review, I approved the simulation verification. The patient's treatment will proceed as planned.  ------------------------------------------------  Thea Silversmith, MD

## 2014-05-11 ENCOUNTER — Ambulatory Visit
Admission: RE | Admit: 2014-05-11 | Discharge: 2014-05-11 | Disposition: A | Discharge status: Home / Self Care | Payer: Medicare Other | Source: Ambulatory Visit | Attending: Radiation Oncology | Admitting: Radiation Oncology

## 2014-05-11 DIAGNOSIS — Z510 Encounter for antineoplastic radiation therapy: Secondary | ICD-10-CM | POA: Diagnosis not present

## 2014-05-12 ENCOUNTER — Ambulatory Visit
Admission: RE | Admit: 2014-05-12 | Discharge: 2014-05-12 | Disposition: A | Discharge status: Home / Self Care | Payer: Medicare Other | Source: Ambulatory Visit | Attending: Radiation Oncology | Admitting: Radiation Oncology

## 2014-05-12 DIAGNOSIS — Z510 Encounter for antineoplastic radiation therapy: Secondary | ICD-10-CM | POA: Diagnosis not present

## 2014-05-13 ENCOUNTER — Ambulatory Visit
Admission: RE | Admit: 2014-05-13 | Discharge: 2014-05-13 | Disposition: A | Discharge status: Home / Self Care | Payer: Medicare Other | Source: Ambulatory Visit | Attending: Radiation Oncology | Admitting: Radiation Oncology

## 2014-05-13 DIAGNOSIS — Z510 Encounter for antineoplastic radiation therapy: Secondary | ICD-10-CM | POA: Diagnosis not present

## 2014-05-16 ENCOUNTER — Ambulatory Visit
Admission: RE | Admit: 2014-05-16 | Discharge: 2014-05-16 | Disposition: A | Discharge status: Home / Self Care | Payer: Medicare Other | Source: Ambulatory Visit | Attending: Radiation Oncology | Admitting: Radiation Oncology

## 2014-05-16 DIAGNOSIS — C50412 Malignant neoplasm of upper-outer quadrant of left female breast: Secondary | ICD-10-CM

## 2014-05-16 DIAGNOSIS — Z510 Encounter for antineoplastic radiation therapy: Secondary | ICD-10-CM | POA: Diagnosis not present

## 2014-05-16 MED ORDER — RADIAPLEXRX EX GEL
Freq: Once | CUTANEOUS | Status: AC
  Administered 2014-05-16: 12:00:00 via TOPICAL

## 2014-05-16 MED ORDER — ALRA NON-METALLIC DEODORANT (RAD-ONC)
1.0000 | Freq: Once | TOPICAL | Status: AC
Start: 2014-05-16 — End: 2014-05-16
  Administered 2014-05-16: 1 via TOPICAL

## 2014-05-16 NOTE — Progress Notes (Signed)
Patient given the Radiation Therapy and You book.  Discussed potential side effects including pain, fatigue and skin changes.  She was also given the skin care handout.  She was given alra Deoderant and radiaplex gel. She was instructed to apply the radiaplex to the treatment area twice a day after treatment and at bedtime.  She was educated about under treat day with Dr. Pablo Ledger on Tuesdays.  She was advised to call with any questions or concerns.

## 2014-05-17 ENCOUNTER — Ambulatory Visit
Admission: RE | Admit: 2014-05-17 | Discharge: 2014-05-17 | Disposition: A | Discharge status: Home / Self Care | Payer: Medicare Other | Source: Ambulatory Visit | Attending: Radiation Oncology | Admitting: Radiation Oncology

## 2014-05-17 DIAGNOSIS — C50412 Malignant neoplasm of upper-outer quadrant of left female breast: Secondary | ICD-10-CM

## 2014-05-17 DIAGNOSIS — Z510 Encounter for antineoplastic radiation therapy: Secondary | ICD-10-CM | POA: Diagnosis not present

## 2014-05-17 NOTE — Progress Notes (Signed)
Weekly Management Note Current Dose:  9 Gy  Projected Dose:61  Gy   Narrative:  The patient presents for routine under treatment assessment.  CBCT/MVCT images/Port film x-rays were reviewed.  The chart was checked. Doing well. No irritation . Needs port flush.   Physical Findings: No skin irritation.  Impression:  The patient is tolerating radiation.  Plan:  Continue treatment as planned. Using radiaplex. Will arrange port flush.

## 2014-05-17 NOTE — Progress Notes (Signed)
Weekly assessment of radiation to left chestwall and supraclavicular region.Completed 5 radiation treatments.No skin changes.Continue application of radiation twice daily.States she has had a dull ache over left chest wall at times that lasts about 1 hour then goes away.Noticed this on last 2 Sundays.Blood pressure low today 97/46, heart rate 87.Patient to hold medication today and come for blood pressure check on Thursday.Patient resumd taking blood pressure medications about 2 weeks ago after being on hold wile taking chemotherapy.I will call to schedule flush of port as it was flushed last on Apr 01, 2014.

## 2014-05-18 ENCOUNTER — Ambulatory Visit
Admission: RE | Admit: 2014-05-18 | Discharge: 2014-05-18 | Disposition: A | Discharge status: Home / Self Care | Payer: Medicare Other | Source: Ambulatory Visit | Attending: Radiation Oncology | Admitting: Radiation Oncology

## 2014-05-18 DIAGNOSIS — Z510 Encounter for antineoplastic radiation therapy: Secondary | ICD-10-CM | POA: Diagnosis not present

## 2014-05-19 ENCOUNTER — Ambulatory Visit
Admission: RE | Admit: 2014-05-19 | Discharge: 2014-05-19 | Disposition: A | Discharge status: Home / Self Care | Payer: Medicare Other | Source: Ambulatory Visit | Attending: Radiation Oncology | Admitting: Radiation Oncology

## 2014-05-19 ENCOUNTER — Ambulatory Visit (HOSPITAL_BASED_OUTPATIENT_CLINIC_OR_DEPARTMENT_OTHER): Payer: Medicare Other

## 2014-05-19 VITALS — BP 119/56 | HR 85 | Temp 97.0°F

## 2014-05-19 DIAGNOSIS — Z95828 Presence of other vascular implants and grafts: Secondary | ICD-10-CM

## 2014-05-19 DIAGNOSIS — Z452 Encounter for adjustment and management of vascular access device: Secondary | ICD-10-CM

## 2014-05-19 DIAGNOSIS — Z510 Encounter for antineoplastic radiation therapy: Secondary | ICD-10-CM | POA: Diagnosis not present

## 2014-05-19 DIAGNOSIS — C50919 Malignant neoplasm of unspecified site of unspecified female breast: Secondary | ICD-10-CM

## 2014-05-19 MED ORDER — SODIUM CHLORIDE 0.9 % IJ SOLN
10.0000 mL | INTRAMUSCULAR | Status: DC | PRN
  Administered 2014-05-19: 10 mL via INTRAVENOUS
  Filled 2014-05-19: qty 10

## 2014-05-19 MED ORDER — HEPARIN SOD (PORK) LOCK FLUSH 100 UNIT/ML IV SOLN
500.0000 [IU] | Freq: Once | INTRAVENOUS | Status: AC
  Administered 2014-05-19: 500 [IU] via INTRAVENOUS
  Filled 2014-05-19: qty 5

## 2014-05-19 NOTE — Patient Instructions (Signed)

## 2014-05-20 ENCOUNTER — Ambulatory Visit
Admission: RE | Admit: 2014-05-20 | Discharge: 2014-05-20 | Disposition: A | Discharge status: Home / Self Care | Payer: Medicare Other | Source: Ambulatory Visit | Attending: Radiation Oncology | Admitting: Radiation Oncology

## 2014-05-20 DIAGNOSIS — Z510 Encounter for antineoplastic radiation therapy: Secondary | ICD-10-CM | POA: Diagnosis not present

## 2014-05-23 ENCOUNTER — Ambulatory Visit
Admission: RE | Admit: 2014-05-23 | Discharge: 2014-05-23 | Disposition: A | Discharge status: Home / Self Care | Payer: Medicare Other | Source: Ambulatory Visit | Attending: Radiation Oncology | Admitting: Radiation Oncology

## 2014-05-23 DIAGNOSIS — Z510 Encounter for antineoplastic radiation therapy: Secondary | ICD-10-CM | POA: Diagnosis not present

## 2014-05-24 ENCOUNTER — Ambulatory Visit
Admission: RE | Admit: 2014-05-24 | Discharge: 2014-05-24 | Disposition: A | Discharge status: Home / Self Care | Payer: Medicare Other | Source: Ambulatory Visit | Attending: Radiation Oncology | Admitting: Radiation Oncology

## 2014-05-24 VITALS — BP 108/55 | HR 83 | Temp 97.6°F | Wt 187.5 lb

## 2014-05-24 DIAGNOSIS — C50412 Malignant neoplasm of upper-outer quadrant of left female breast: Secondary | ICD-10-CM

## 2014-05-24 DIAGNOSIS — Z510 Encounter for antineoplastic radiation therapy: Secondary | ICD-10-CM | POA: Diagnosis not present

## 2014-05-24 NOTE — Progress Notes (Signed)
Weekly assessment of radiation to left breast.Completed 10 treatments.Denies pain.Skin is pink in mid chest otherwise no changes.Nail beds with ridges and discoloration is growing out.Patient questions if nails are going to come off.Has some numbness of toes which I informed isn't unusual post completion of chemotherapy.Mild fatigue.

## 2014-05-24 NOTE — Progress Notes (Signed)
Weekly Management Note Current Dose:  18 Gy  Projected Dose: 60.4 Gy   Narrative:  The patient presents for routine under treatment assessment.  CBCT/MVCT images/Port film x-rays were reviewed.  The chart was checked. Nail changes. No treatment related complaints. Using radiaplex.  Physical Findings: Weight: 187 lb 8 oz (85.049 kg). Slightly pink chest wall.  Impression:  The patient is tolerating radiation.  Plan:  Continue treatment as planned. Continue RT.

## 2014-05-25 ENCOUNTER — Ambulatory Visit
Admission: RE | Admit: 2014-05-25 | Discharge: 2014-05-25 | Disposition: A | Discharge status: Home / Self Care | Payer: Medicare Other | Source: Ambulatory Visit | Attending: Radiation Oncology | Admitting: Radiation Oncology

## 2014-05-25 DIAGNOSIS — Z510 Encounter for antineoplastic radiation therapy: Secondary | ICD-10-CM | POA: Diagnosis not present

## 2014-05-26 ENCOUNTER — Ambulatory Visit
Admission: RE | Admit: 2014-05-26 | Discharge: 2014-05-26 | Disposition: A | Discharge status: Home / Self Care | Payer: Medicare Other | Source: Ambulatory Visit | Attending: Radiation Oncology | Admitting: Radiation Oncology

## 2014-05-26 DIAGNOSIS — Z510 Encounter for antineoplastic radiation therapy: Secondary | ICD-10-CM | POA: Diagnosis not present

## 2014-05-30 ENCOUNTER — Ambulatory Visit
Admission: RE | Admit: 2014-05-30 | Discharge: 2014-05-30 | Disposition: A | Discharge status: Home / Self Care | Payer: Medicare Other | Source: Ambulatory Visit | Attending: Radiation Oncology | Admitting: Radiation Oncology

## 2014-05-30 DIAGNOSIS — Z510 Encounter for antineoplastic radiation therapy: Secondary | ICD-10-CM | POA: Diagnosis not present

## 2014-05-31 ENCOUNTER — Telehealth: Payer: Self-pay | Admitting: Adult Health

## 2014-05-31 ENCOUNTER — Ambulatory Visit
Admission: RE | Admit: 2014-05-31 | Discharge: 2014-05-31 | Disposition: A | Discharge status: Home / Self Care | Payer: Medicare Other | Source: Ambulatory Visit | Attending: Radiation Oncology | Admitting: Radiation Oncology

## 2014-05-31 VITALS — BP 127/62 | HR 75 | Temp 97.5°F | Wt 187.5 lb

## 2014-05-31 DIAGNOSIS — C50412 Malignant neoplasm of upper-outer quadrant of left female breast: Secondary | ICD-10-CM

## 2014-05-31 DIAGNOSIS — Z510 Encounter for antineoplastic radiation therapy: Secondary | ICD-10-CM | POA: Diagnosis not present

## 2014-05-31 NOTE — Progress Notes (Signed)
Weekly Management Note Current Dose:  25.2 Gy  Projected Dose: 60.4 Gy   Narrative:  The patient presents for routine under treatment assessment.  CBCT/MVCT images/Port film x-rays were reviewed.  The chart was checked. Skin irritated. Wondering when she can go back to work  Physical Findings: Weight: 187 lb 8 oz (85.049 kg). Dermatitis medially and along SCLV fossa No moist desquamation  Impression:  The patient is tolerating radiation.  Plan:  Continue treatment as planned. Continue radiaplex. Discussed differences in job performance activities and what she would need to do. Discusse d"easing" back in to work. Discussed increasing her activity slowly. It sounds like she needs to be fully functional to go back to work so she may require a month or so of recuperation after RT to allow her skin time to heal.

## 2014-05-31 NOTE — Progress Notes (Signed)
Weekly assessment of radiation to left chestwall and supraclavicular region.Mild redness with rash but no itching.Continue application of radiaplex.tolerating radiation well.

## 2014-05-31 NOTE — Telephone Encounter (Signed)
, °

## 2014-06-01 ENCOUNTER — Ambulatory Visit
Admission: RE | Admit: 2014-06-01 | Discharge: 2014-06-01 | Disposition: A | Discharge status: Home / Self Care | Payer: Medicare Other | Source: Ambulatory Visit | Attending: Radiation Oncology | Admitting: Radiation Oncology

## 2014-06-01 DIAGNOSIS — Z510 Encounter for antineoplastic radiation therapy: Secondary | ICD-10-CM | POA: Diagnosis not present

## 2014-06-02 ENCOUNTER — Ambulatory Visit
Admission: RE | Admit: 2014-06-02 | Discharge: 2014-06-02 | Disposition: A | Discharge status: Home / Self Care | Payer: Medicare Other | Source: Ambulatory Visit | Attending: Radiation Oncology | Admitting: Radiation Oncology

## 2014-06-02 DIAGNOSIS — Z510 Encounter for antineoplastic radiation therapy: Secondary | ICD-10-CM | POA: Diagnosis not present

## 2014-06-03 ENCOUNTER — Ambulatory Visit
Admission: RE | Admit: 2014-06-03 | Discharge: 2014-06-03 | Disposition: A | Discharge status: Home / Self Care | Payer: Medicare Other | Source: Ambulatory Visit | Attending: Radiation Oncology | Admitting: Radiation Oncology

## 2014-06-03 DIAGNOSIS — Z510 Encounter for antineoplastic radiation therapy: Secondary | ICD-10-CM | POA: Diagnosis not present

## 2014-06-06 ENCOUNTER — Ambulatory Visit
Admission: RE | Admit: 2014-06-06 | Discharge: 2014-06-06 | Disposition: A | Discharge status: Home / Self Care | Payer: Medicare Other | Source: Ambulatory Visit | Attending: Radiation Oncology | Admitting: Radiation Oncology

## 2014-06-06 DIAGNOSIS — Z510 Encounter for antineoplastic radiation therapy: Secondary | ICD-10-CM | POA: Diagnosis not present

## 2014-06-07 ENCOUNTER — Ambulatory Visit
Admission: RE | Admit: 2014-06-07 | Discharge: 2014-06-07 | Disposition: A | Discharge status: Home / Self Care | Payer: Medicare Other | Source: Ambulatory Visit | Attending: Radiation Oncology | Admitting: Radiation Oncology

## 2014-06-07 VITALS — BP 148/65 | HR 70 | Temp 97.4°F | Wt 187.9 lb

## 2014-06-07 DIAGNOSIS — C50412 Malignant neoplasm of upper-outer quadrant of left female breast: Secondary | ICD-10-CM

## 2014-06-07 DIAGNOSIS — Z510 Encounter for antineoplastic radiation therapy: Secondary | ICD-10-CM | POA: Diagnosis not present

## 2014-06-07 MED ORDER — BIAFINE EX EMUL
CUTANEOUS | Status: DC | PRN
  Administered 2014-06-07: 11:00:00 via TOPICAL

## 2014-06-07 NOTE — Progress Notes (Signed)
Weekly Management Note Current Dose:  34.2 Gy  Projected Dose: 60.4 Gy   Narrative:  The patient presents for routine under treatment assessment.  CBCT/MVCT images/Port film x-rays were reviewed.  The chart was checked. Itchy red rash over clavicle. No pain. Questions about follow up and imaging tests/survivorship.   Physical Findings: Weight: 187 lb 14.4 oz (85.231 kg). Dermatitis medially and in supraclavicular fossa.  Impression:  The patient is tolerating radiation.  Plan:  Continue treatment as planned. Add biafene. Discussed [facility]

## 2014-06-07 NOTE — Progress Notes (Signed)
Weekly assessment of radiation to left chest wall.Has red rash more prominent over clavicle.Will give biafine to apply 2 to 3 times daily.Mild fatigue.

## 2014-06-08 ENCOUNTER — Ambulatory Visit
Admission: RE | Admit: 2014-06-08 | Discharge: 2014-06-08 | Disposition: A | Discharge status: Home / Self Care | Payer: Medicare Other | Source: Ambulatory Visit | Attending: Radiation Oncology | Admitting: Radiation Oncology

## 2014-06-08 DIAGNOSIS — Z510 Encounter for antineoplastic radiation therapy: Secondary | ICD-10-CM | POA: Diagnosis not present

## 2014-06-09 ENCOUNTER — Ambulatory Visit
Admission: RE | Admit: 2014-06-09 | Discharge: 2014-06-09 | Disposition: A | Discharge status: Home / Self Care | Payer: Medicare Other | Source: Ambulatory Visit | Attending: Radiation Oncology | Admitting: Radiation Oncology

## 2014-06-09 DIAGNOSIS — Z510 Encounter for antineoplastic radiation therapy: Secondary | ICD-10-CM | POA: Diagnosis not present

## 2014-06-10 ENCOUNTER — Ambulatory Visit
Admission: RE | Admit: 2014-06-10 | Discharge: 2014-06-10 | Disposition: A | Discharge status: Home / Self Care | Payer: Medicare Other | Source: Ambulatory Visit | Attending: Radiation Oncology | Admitting: Radiation Oncology

## 2014-06-10 DIAGNOSIS — Z510 Encounter for antineoplastic radiation therapy: Secondary | ICD-10-CM | POA: Diagnosis not present

## 2014-06-13 ENCOUNTER — Ambulatory Visit
Admission: RE | Admit: 2014-06-13 | Discharge: 2014-06-13 | Disposition: A | Discharge status: Home / Self Care | Payer: Medicare Other | Source: Ambulatory Visit | Attending: Radiation Oncology | Admitting: Radiation Oncology

## 2014-06-13 DIAGNOSIS — Z510 Encounter for antineoplastic radiation therapy: Secondary | ICD-10-CM | POA: Diagnosis not present

## 2014-06-14 ENCOUNTER — Ambulatory Visit
Admission: RE | Admit: 2014-06-14 | Discharge: 2014-06-14 | Disposition: A | Discharge status: Home / Self Care | Payer: Medicare Other | Source: Ambulatory Visit | Attending: Radiation Oncology | Admitting: Radiation Oncology

## 2014-06-14 ENCOUNTER — Encounter: Payer: Self-pay | Admitting: Radiation Oncology

## 2014-06-14 ENCOUNTER — Ambulatory Visit: Payer: 59 | Admitting: Adult Health

## 2014-06-14 VITALS — BP 134/71 | HR 71 | Temp 97.5°F | Resp 20 | Wt 189.3 lb

## 2014-06-14 DIAGNOSIS — C50412 Malignant neoplasm of upper-outer quadrant of left female breast: Secondary | ICD-10-CM

## 2014-06-14 DIAGNOSIS — Z510 Encounter for antineoplastic radiation therapy: Secondary | ICD-10-CM | POA: Diagnosis not present

## 2014-06-14 NOTE — Progress Notes (Signed)
Hydro Gel pads  box given along with 2 mesh tube tops with instructions of use pf gel pads, patiwnt gave verbal understqanding 10:07 AM

## 2014-06-14 NOTE — Progress Notes (Signed)
Weekly Management Note Current Dose: 43.2  Gy  Projected Dose: 60.4 Gy   Narrative:  The patient presents for routine under treatment assessment.  CBCT/MVCT images/Port film x-rays were reviewed.  The chart was checked. Saw on tx machine to markout electron boost. Pain in axilla due to skin irritation.   Physical Findings: Skin is pink and irritated.  Impression:  The patient is tolerating radiation.  Plan:  Continue treatment as planned. Continue baifene. Add hydrogel.

## 2014-06-14 NOTE — Progress Notes (Addendum)
Weekly rad txs left chest wall, 22 completed, rash,red over subclavicular ,dermatitis , , using biafine cream bid, did say at times burns and itchy on subclavicular region and under left chest area, mild fatigue 9:55 AM

## 2014-06-14 NOTE — Progress Notes (Signed)
Weekly Management Note Current Dose:  43.2 Gy  Projected Dose: 60.4 Gy   Narrative:  The patient presents for routine under treatment assessment.  CBCT/MVCT images/Port film x-rays were reviewed.  The chart was checked. Doing well. Irritation in axilla.   Physical Findings: Weight: 189 lb 4.8 oz (85.866 kg). Skin is pink. More irritation medially and laterally. No moist desquamation.   Impression:  The patient is tolerating radiation.  Plan:  Continue treatment as planned.continue RT. Continue biafene. Add hydrogel in axilla.

## 2014-06-15 ENCOUNTER — Ambulatory Visit
Admission: RE | Admit: 2014-06-15 | Discharge: 2014-06-15 | Disposition: A | Discharge status: Home / Self Care | Payer: Medicare Other | Source: Ambulatory Visit | Attending: Radiation Oncology | Admitting: Radiation Oncology

## 2014-06-15 DIAGNOSIS — Z510 Encounter for antineoplastic radiation therapy: Secondary | ICD-10-CM | POA: Diagnosis not present

## 2014-06-16 ENCOUNTER — Ambulatory Visit
Admission: RE | Admit: 2014-06-16 | Discharge: 2014-06-16 | Disposition: A | Discharge status: Home / Self Care | Payer: Medicare Other | Source: Ambulatory Visit | Attending: Radiation Oncology | Admitting: Radiation Oncology

## 2014-06-16 DIAGNOSIS — Z510 Encounter for antineoplastic radiation therapy: Secondary | ICD-10-CM | POA: Diagnosis not present

## 2014-06-17 ENCOUNTER — Ambulatory Visit
Admission: RE | Admit: 2014-06-17 | Discharge: 2014-06-17 | Disposition: A | Discharge status: Home / Self Care | Payer: Medicare Other | Source: Ambulatory Visit | Attending: Radiation Oncology | Admitting: Radiation Oncology

## 2014-06-17 DIAGNOSIS — Z510 Encounter for antineoplastic radiation therapy: Secondary | ICD-10-CM | POA: Diagnosis not present

## 2014-06-20 ENCOUNTER — Ambulatory Visit
Admission: RE | Admit: 2014-06-20 | Discharge: 2014-06-20 | Disposition: A | Discharge status: Home / Self Care | Payer: Medicare Other | Source: Ambulatory Visit | Attending: Radiation Oncology | Admitting: Radiation Oncology

## 2014-06-20 DIAGNOSIS — Z510 Encounter for antineoplastic radiation therapy: Secondary | ICD-10-CM | POA: Diagnosis not present

## 2014-06-21 ENCOUNTER — Ambulatory Visit
Admission: RE | Admit: 2014-06-21 | Discharge: 2014-06-21 | Disposition: A | Discharge status: Home / Self Care | Payer: Medicare Other | Source: Ambulatory Visit | Attending: Radiation Oncology | Admitting: Radiation Oncology

## 2014-06-21 VITALS — BP 103/43 | HR 56 | Temp 97.5°F | Wt 189.0 lb

## 2014-06-21 DIAGNOSIS — C50412 Malignant neoplasm of upper-outer quadrant of left female breast: Secondary | ICD-10-CM

## 2014-06-21 DIAGNOSIS — Z510 Encounter for antineoplastic radiation therapy: Secondary | ICD-10-CM | POA: Diagnosis not present

## 2014-06-21 NOTE — Progress Notes (Signed)
  Radiation Oncology         (336) [phone] ________________________________  Name: Wendy [name] MRN: [id number]  Date: 06/21/2014  DOB: 1941/09/27  Weekly Radiation Therapy Management  Breast cancer of upper-outer quadrant of left female breast   Primary site: Breast   Staging method: AJCC 7th Edition   Clinical: Stage IIB (T2, N1, cM0)    Summary: Stage IIB (T2, N1, cM0)   Current Dose: 52.4 Gy     Planned Dose:  60.4 Gy  Narrative . . . . . . . . The patient presents for routine under treatment assessment.                                   The patient is without complaint except for soreness in the treatment area where she has the most significant skin reaction                                 Set-up films were reviewed.                                 The chart was checked. Physical Findings. . .  weight is 189 lb (85.73 kg). Her temperature is 97.5 F (36.4 C). Her blood pressure is 103/43 and her pulse is 56. . Weight essentially stable. The lungs are clear. The heart has regular rhythm and rate. Patient has erythema and dry desquamation in the supraclavicular region. She has brisk erythema in the chest wall area but no moist desquamation at this time. Impression . . . . . . . The patient is tolerating radiation. Plan . . . . . . . . . . . . Continue treatment as planned. Advised patient to obtain triple antibiotic ointment as she may need this in the near future.  ________________________________   Blair Promise, PhD, MD

## 2014-06-21 NOTE — Progress Notes (Signed)
Weekly assessment of radiation to left chest wall.Patient has more treatments of boost remaining.Increased redness and discomfort of left supraclavicular region.Will give another tube of biafine today.Stopped niaspan x 1 week as caused redness to start in face and go down body and cause a "prickly pain.Patient see Dr.Tilley for cardiac isssues.

## 2014-06-22 ENCOUNTER — Ambulatory Visit
Admission: RE | Admit: 2014-06-22 | Discharge: 2014-06-22 | Disposition: A | Discharge status: Home / Self Care | Payer: Medicare Other | Source: Ambulatory Visit | Attending: Radiation Oncology | Admitting: Radiation Oncology

## 2014-06-22 DIAGNOSIS — Z510 Encounter for antineoplastic radiation therapy: Secondary | ICD-10-CM | POA: Diagnosis not present

## 2014-06-23 ENCOUNTER — Ambulatory Visit
Admission: RE | Admit: 2014-06-23 | Discharge: 2014-06-23 | Disposition: A | Discharge status: Home / Self Care | Payer: Medicare Other | Source: Ambulatory Visit | Attending: Radiation Oncology | Admitting: Radiation Oncology

## 2014-06-23 DIAGNOSIS — Z510 Encounter for antineoplastic radiation therapy: Secondary | ICD-10-CM | POA: Diagnosis not present

## 2014-06-24 ENCOUNTER — Ambulatory Visit
Admission: RE | Admit: 2014-06-24 | Discharge: 2014-06-24 | Disposition: A | Discharge status: Home / Self Care | Payer: Medicare Other | Source: Ambulatory Visit | Attending: Radiation Oncology | Admitting: Radiation Oncology

## 2014-06-24 DIAGNOSIS — Z510 Encounter for antineoplastic radiation therapy: Secondary | ICD-10-CM | POA: Diagnosis not present

## 2014-06-27 ENCOUNTER — Encounter: Payer: Self-pay | Admitting: Radiation Oncology

## 2014-06-27 ENCOUNTER — Ambulatory Visit
Admission: RE | Admit: 2014-06-27 | Discharge: 2014-06-27 | Disposition: A | Discharge status: Home / Self Care | Payer: Medicare Other | Source: Ambulatory Visit | Attending: Radiation Oncology | Admitting: Radiation Oncology

## 2014-06-27 VITALS — BP 113/54 | HR 83 | Temp 97.5°F | Wt 190.7 lb

## 2014-06-27 DIAGNOSIS — Z510 Encounter for antineoplastic radiation therapy: Secondary | ICD-10-CM | POA: Diagnosis not present

## 2014-06-27 DIAGNOSIS — C50412 Malignant neoplasm of upper-outer quadrant of left female breast: Secondary | ICD-10-CM

## 2014-06-27 NOTE — Progress Notes (Signed)
   Weekly Management Note  Outpatient   ICD-9-CM  1. Breast cancer of upper-outer quadrant of left female breast 174.4    Completed Radiotherapy. Total Dose:60.4 Gy   Narrative:  The patient presents for routine under treatment assessment on last day of radiotherapy.  CBCT/MVCT images/Port film x-rays were reviewed.  The chart was checked.    Physical Findings:  weight is 190 lb 11.2 oz (86.501 kg). Her temperature is 97.5 F (36.4 C). Her blood pressure is 113/54 and her pulse is 83.   Skin over left chest wall erythematous with moist desquamation in skin folds, no signs of infection  Impression:  The patient has tolerated radiotherapy.  Plan:  Routine follow-up in 2 weeks - card given to schedule.  Continue neosporin and Biafine as directed ________________________________   Eppie Gibson, M.D.

## 2014-06-27 NOTE — Progress Notes (Signed)
Patient has completed radiation to left chest wall and supraclavicular region for breast cancer.Skin is red with a few areas of wet desquamation.to continue applying neosporin to open area and biafine over there remaining treatment field.Will return in 2 weeks for skin check.Patient has an appointment in med/onc next week and knows she may come down to see me if she has questions regarding her skin.Generalized fatigue.

## 2014-06-30 NOTE — Progress Notes (Signed)
  Radiation Oncology         [phone]) [phone] ________________________________  Name: Wendy [name] MRN: [id number]  Date: 06/27/2014  DOB: April 25, 1941  End of Treatment Note  Diagnosis:   T2N1 Invasive Ductal Carcinoma of the Left Breast     Indication for treatment:  Curative       Radiation treatment dates:  06/10/2014-06/27/2014  Site/dose:   Left chest wall / 50.4 Gray @ 1.8 [place] Cables per fraction x 28 fractions Left Supraclavicular fossa / 45 Gray @1 .8 Gray per fraction x 25 fractions Left PAB / 45 Gy at 1.8 Gray per fraction x 25 fractions Left scar / 10 Gray at [employer] per fraction x 5 fractions  Beams/Energies: Opposed tangents/ 6 and 10 MV photons RAO / 15 MV photons LPO / 6 MV photons En face with 6 MeV electrons  Narrative: The patient tolerated radiation treatment relatively well.   She had skin irritation in the treatment field with moist desquamation in the skin folds of her scar and axilla.   Plan: The patient has completed radiation treatment. The patient will return to radiation oncology clinic for routine followup in one month. I advised them to call or return sooner if they have any questions or concerns related to their recovery or treatment.  ------------------------------------------------  Thea Silversmith, MD

## 2014-07-06 ENCOUNTER — Telehealth: Payer: Self-pay | Admitting: Adult Health

## 2014-07-06 ENCOUNTER — Ambulatory Visit (HOSPITAL_BASED_OUTPATIENT_CLINIC_OR_DEPARTMENT_OTHER): Payer: Medicare Other | Admitting: Adult Health

## 2014-07-06 ENCOUNTER — Encounter: Payer: Self-pay | Admitting: Adult Health

## 2014-07-06 VITALS — BP 124/57 | HR 79 | Temp 97.7°F | Resp 18 | Ht 68.0 in | Wt 190.4 lb

## 2014-07-06 DIAGNOSIS — C50412 Malignant neoplasm of upper-outer quadrant of left female breast: Secondary | ICD-10-CM

## 2014-07-06 DIAGNOSIS — Z170 Estrogen receptor positive status [ER+]: Secondary | ICD-10-CM

## 2014-07-06 DIAGNOSIS — C50919 Malignant neoplasm of unspecified site of unspecified female breast: Secondary | ICD-10-CM

## 2014-07-06 DIAGNOSIS — E2839 Other primary ovarian failure: Secondary | ICD-10-CM

## 2014-07-06 MED ORDER — ANASTROZOLE 1 MG PO TABS: 1.0000 mg | ORAL_TABLET | Freq: Every day | ORAL | Status: DC

## 2014-07-06 NOTE — Patient Instructions (Signed)

## 2014-07-06 NOTE — Progress Notes (Addendum)
Hematology and Oncology Follow Up Visit  ZI SEK [id number] 09-23-1941 73 y.o. 07/06/2014 11:52 AM     Principal Diagnosis:Wendy [name] 73 y.o. 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     Prior Oncologic History:  1.  The patient palpated a mass in her left breast. Because of this she had a diagnostic mammogram and ultrasound performed on 10/27/2013. This showed a spiculated mass in the 6:00 position measuring approximately 3 cm. Ultrasound confirmed the hypoechoic mass at the 6:00 position measuring 2.6 x 2.1 x 1.5 cm. There was also noted to be a second mass in the 12:00 position measuring 2.1 x 1.8 x 2.1 cm. 2 abnormal lymph nodes were noted in the left axilla largest one measuring 1.2 cm. Patient had a biopsy of the primary breast masses. She also had a biopsy of the lymph node. All 3 biopsies were positive for invasive ductal carcinoma. Tumor was ER 100% PR 100% positive HER-2/neu negative with a proliferation marker Ki-67 low at 14% and low   2. patient is status post left mastectomy Performed by Dr. [name] on 11/12/2013. The final pathology revealed: T3 N2, stage IIIA invasive ductal carcinoma, grade I, ER 100%, PR 100%, Ki-67 14%, HER-2/neu negative. PET scan on 12/20/2013 was negative for metastases.    3. Patient received adjuvant chemotherapy consisting of Taxotere Cytoxan every 3 weeks for a total of 6 cycles curative intent from 12/17/2013 through 04/02/2014.  4. Adjuvant radiation under the care of Dr. Pablo Ledger from 05/11/2014 through 06/27/2014  5. Arimidex 36m started on 07/06/14.   Interval History: Wendy ROLFSON89y.o. female with stage IIB ER/PR positive invasive ductal carcinoma of the left breast.  She is here today following adjuvant radiation therapy with Dr. WPablo Ledgerto discuss adjuvant anti estrogen therapy.  She is feeling well today.  She does still have some residual peeling on  her left breast but otherwise generally has no questions or concerns.  She continues to have some mild numbness underneath her left axilla that has been present since surgery.  During the physical exam, she did report difficulty with ROM of her left arm.  Seh was seen by PT following surgery and given a list of exercises to perform, she just hasn't performed them.  She has had She denies fevers, chills, nausea, vomiting, bowel/bladder changes, headaches, new pain, peripheral neuropathy, or any further concerns.    Medications:  Current Outpatient Prescriptions  Medication Sig Dispense Refill  . aspirin EC 81 MG tablet Take 81 mg by mouth daily.      .Marland Kitchenatorvastatin (LIPITOR) 40 MG tablet Take 20 mg by mouth at bedtime.      . Calcium Carb-Cholecalciferol (CALCIUM 600 + D PO) Take 1 tablet by mouth daily.      .Marland Kitchenemollient (BIAFINE) cream Apply 1 application topically 2 (two) times daily. Apply to affected area after rad txs and bedtime daily and on weekends,& prn      . Multiple Vitamin (MULTIVITAMIN WITH MINERALS) TABS tablet Take 1 tablet by mouth daily.      . niacin (NIASPAN) 500 MG CR tablet Take 500 mg by mouth every other day. At bedtime      . NITROSTAT 0.4 MG SL tablet       . omega-3 acid ethyl esters (LOVAZA) 1 G capsule Take 1 g by mouth daily.      . Potassium 99 MG TABS Take 1 tablet  by mouth daily.      [name] UNABLE TO FIND Rx: L8000- Mastectomy Bra (Quantity: 2) Dx: 174.9; Left Mastectomy       No current facility-administered medications for this visit.     Allergies: No Known Allergies  Medical History: Past Medical History  Diagnosis Date  . Hypertension   . CAD (coronary artery disease)   . Hyperlipidemia   . Peripheral vascular disease     atherosclerosis carotid artery-mild  . Breast cancer     left breast  . Myocardial infarction 11/22/04    Surgical History:  Past Surgical History  Procedure Laterality Date  . Percutaneous coronary stent intervention (pci-s)   2005  . Abdominal hysterectomy  1982  . 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 Knee arthroscopy      knee  . Mastectomy modified radical Left 11/12/2013    Procedure: MASTECTOMY MODIFIED RADICAL;  Surgeon: Rolm Bookbinder, MD;  Location: [facility];  Service: General;  Laterality: Left;  . Portacath placement Right 12/16/2013    Procedure: INSERTION PORT-A-CATH;  Surgeon: Rolm Bookbinder, MD;  Location: [facility];  Service: General;  Laterality: Right;     Review of Systems: A 10 point review of systems was conducted and is otherwise negative except for what is noted above.    Health Maintenance  Mammogram: 10/2013 Colonoscopy: has had one, however cannot recall date or physician name Bone Density Scan: never Pap Smear: "a long time ago" s/p TAH Eye Exam: 05/2014 Vitamin D Level: has never been checked Lipid Panel: followed by Dr. Wynonia Lawman annually  Physical Exam: Blood pressure 124/57, pulse 79, temperature 97.7 F (36.5 C), temperature source Oral, resp. rate 18, height _0  (1.727 m), weight 190 lb 6.4 oz (86.365 kg). GENERAL: Patient is a well appearing female in no acute distress HEENT:  Sclerae anicteric.  Oropharynx clear and slightly dry. No ulcerations or evidence of oropharyngeal candidiasis. Neck is supple.  NODES:  No cervical, supraclavicular, or axillary lymphadenopathy palpated.  BREAST EXAM: left chest wall with desquamation and a small scab, erythematous present, no sign of warmth, tenderness, or infection, right breast with longstanding inverted nipple, no masses, nodules or skin changes. LUNGS:  Clear to auscultation bilaterally.  No wheezes or rhonchi. HEART:  Regular rate and rhythm. No murmur appreciated. ABDOMEN:  Soft, nontender.  Positive, normoactive bowel sounds. No organomegaly palpated. MSK:  No focal spinal tenderness to palpation. Full range of motion bilaterally in the upper extremities. EXTREMITIES:  No peripheral edema.   SKIN:   Clear with no obvious rashes or skin changes. Slightly brittle fingernails.  No discoloration noted.  NEURO:  Nonfocal. Well oriented.  Appropriate affect. ECOG PERFORMANCE STATUS: 1 - Symptomatic but completely ambulatory   Lab Results: Lab Results  Component Value Date   WBC 2.6* 04/08/2014   HGB 12.0 04/08/2014   HCT 36.5 04/08/2014   MCV 92.6 04/08/2014   PLT 151 04/08/2014     Chemistry      Component Value Date/Time   NA 140 04/08/2014 1053   NA 143 12/10/2013 1150   K 4.1 04/08/2014 1053   K 3.7 12/10/2013 1150   CL 102 12/10/2013 1150   CO2 25 04/08/2014 1053   CO2 28 12/10/2013 1150   BUN 15.6 04/08/2014 1053   BUN 23 12/10/2013 1150   CREATININE 0.9 04/08/2014 1053   CREATININE 0.99 12/10/2013 1150      Component Value Date/Time   CALCIUM 10.1 04/08/2014 1053  CALCIUM 9.6 12/10/2013 1150   ALKPHOS 83 04/08/2014 1053   ALKPHOS 60 12/10/2013 1150   AST 20 04/08/2014 1053   AST 26 12/10/2013 1150   ALT 25 04/08/2014 1053   ALT 39* 12/10/2013 1150   BILITOT 0.82 04/08/2014 1053   BILITOT 0.3 12/10/2013 1150      Assessment: Wendy [name] 73 y.o. female with T3 N2, stage IIIA invasive ductal carcinoma, grade I, ER 100%, PR 100%, Ki-67 14%, HER-2/neu negative. PET scan in 11/2013 was neg. For mets.  1.  Patient was started on adjuvant chemotherapy consisting of Taxotere Cytoxan every 3 weeks for a total of 6 cycles curative intent from 12/17/2013 through 04/02/2014.  2.  Adjuvant radiation was performed from 05/11/14 through 06/27/2014.    3. Arimidex 76m started on 07/06/2014.  Plan:   OCassideeis doing well today.  She has just completed radiation and tolerated it relatively well.  She will continue to apply the biafine cream to her skin and f/u with Dr. WPablo Ledgernext week.    We discussed her breast cancer history, staging, and ER/PR positivity in detail today.  We discussed starting anti-estrogen therapy in detail today.  We updated her health maintenance above and she will take  Arimidex 140mdaily.  I reviewed this medication with her in detail and gave her detailed information about it in her AVS.  She will need a bone density and this has been ordered.  We discussed bone health including calcium and vitamin d intake, along with weight bearing exercises.    She still has her port from chemotherapy, and she can have this removed.  I sent Dr. WaDonne Hazel message regarding this today.   OtKarolill return in one month for a CBC, CMP and Vitamin D level along with f/u with Dr. GuLindi Adie The bone density report should have resulted by then for him to review with the patient.  Should her port not be removed by this time, she will need her port flushed.  The above plan was reviewed with the patient in detail and she was in agreement.   She knows to call usKorean the interim for any questions or concerns.  We can certainly see her sooner if needed.  I spent 25 minutes counseling the patient face to face.  The total time spent in the appointment was 30 minutes.  LiMinette [phone]/10/2014 11:52 AM

## 2014-07-06 NOTE — Telephone Encounter (Signed)
, °

## 2014-07-11 ENCOUNTER — Other Ambulatory Visit (HOSPITAL_COMMUNITY): Payer: Medicare Other

## 2014-07-14 ENCOUNTER — Ambulatory Visit: Payer: Medicare Other | Admitting: Radiation Oncology

## 2014-07-15 ENCOUNTER — Encounter: Payer: Self-pay | Admitting: Radiation Oncology

## 2014-07-15 NOTE — Progress Notes (Signed)
On 7/23 Ms. Luckenbaugh was brought to the treatment unit and placed in the planned treatment position. The clinical setup was verified. Then port films were obtained and uploaded to the radiation oncology medical record software.  The treatment beams were carefully compared against the planned radiation fields. The position location and shape of the radiation fields was reviewed. The targeted volume of tissue appears appropriately covered by the radiation beams. Organs at risk appear to be excluded as planned.  Based on my personal review, I approved the simulation verification.   ------------------------------------------------  Thea Silversmith, MD

## 2014-07-20 ENCOUNTER — Telehealth (INDEPENDENT_AMBULATORY_CARE_PROVIDER_SITE_OTHER): Payer: Self-pay

## 2014-07-20 NOTE — Telephone Encounter (Signed)
Message copied by Illene Regulus on Wed Jul 20, 2014  2:22 PM ------      Message from: [employer], MATTHEW      Created: Wed Jul 06, 2014 12:29 PM       She can either come in or schedule. Local or sedation. Not emergency.       ----- Message -----         From: Minette Headland, NP         Sent: 07/06/2014  12:19 PM           To: Rolm Bookbinder, MD            Patient can have port removed.  Thanks!!!!!            Hope you are doing well.            LC       ------

## 2014-07-20 NOTE — Telephone Encounter (Signed)
LMOM at home and work # for pt to call me back so I can figure out about the [facility] removal.

## 2014-07-21 ENCOUNTER — Ambulatory Visit
Admission: RE | Admit: 2014-07-21 | Discharge: 2014-07-21 | Disposition: A | Discharge status: Home / Self Care | Payer: Medicare Other | Source: Ambulatory Visit | Attending: Radiation Oncology | Admitting: Radiation Oncology

## 2014-07-21 VITALS — BP 127/58 | HR 75 | Temp 97.5°F | Wt 189.9 lb

## 2014-07-21 DIAGNOSIS — C50412 Malignant neoplasm of upper-outer quadrant of left female breast: Secondary | ICD-10-CM

## 2014-07-21 NOTE — Progress Notes (Signed)
Patient for routine one month follow up for left chest wall radiation (breast)Skin is pink.continue application of lotion with vitamin e.Given number to call Forestine Na to reschedule bone density test.Started  arimidex on 07/08/14.States she had a tingling sensation of head on the first day but that has dissipated.Denies pain.

## 2014-07-21 NOTE — Telephone Encounter (Signed)
Returned pt's call. The pt would like the PAC removal under just local as out pt at the hospital. The pt is fine with just scheduling the procedure she does not want an appt to see Dr [name] to discuss the [facility] removal. i advised pt that I would get surgical orders completed and turned into coding. Pt advised me that she no longer has [employer] as primary it's Medicare now as her Primary.

## 2014-07-22 NOTE — Progress Notes (Signed)
   Department of Radiation Oncology  Phone:  [phone] Fax:        [phone]   Name: Wendy [name] MRN: [id number]  DOB: 1941-08-12  Date: 07/21/2014  Follow Up Visit Note  Diagnosis: T2N1 Left Breast  Summary and Interval since last radiation: 1 month from 60.4 Gy to the left chest wall   Interval History: Wendy [name] presents today for routine followup.  She is doing well. She is taking her Arimidex and tolerating that well. She has contacted Dr. [name] regarding removal of her portacath. Her skin has healed up well. She has not returned to work but is thinking about doing that in the next few weeks.   Allergies: No Known Allergies  Medications:  Current Outpatient Prescriptions  Medication Sig Dispense Refill  . anastrozole (ARIMIDEX) 1 MG tablet Take 1 tablet (1 mg total) by mouth daily.  30 tablet  1  . aspirin EC 81 MG tablet Take 81 mg by mouth daily.      [name] atorvastatin (LIPITOR) 40 MG tablet Take 20 mg by mouth at bedtime.      . Calcium Carb-Cholecalciferol (CALCIUM 600 + D PO) Take 1 tablet by mouth daily.      . Multiple Vitamin (MULTIVITAMIN WITH MINERALS) TABS tablet Take 1 tablet by mouth daily.      . niacin (NIASPAN) 500 MG CR tablet Take 500 mg by mouth every other day. At bedtime      . NITROSTAT 0.4 MG SL tablet       . omega-3 acid ethyl esters (LOVAZA) 1 G capsule Take 1 g by mouth daily.      . Potassium 99 MG TABS Take 1 tablet by mouth daily.      [name] UNABLE TO FIND Rx: L8000- Mastectomy Bra (Quantity: 2) Dx: 174.9; Left Mastectomy       No current facility-administered medications for this encounter.    Physical Exam:  Filed Vitals:   07/21/14 1509  BP: 127/58  Pulse: 75  Temp: 97.5 F (36.4 C)  Weight: 189 lb 14.4 oz (86.138 kg)   Pink skin over left chest well with some skin darkening. Good range of motion of left arm  IMPRESSION: Wendy [name] is a 73 y.o. female s/p radiaiton with resolving acute effects of radiation  PLAN:  She is doing  well. We discussed the need for follow up every 4-6 months which she has scheduled.  She should continue her AI. We discussed the need for yearly mammograms of the right breast which she can schedule with her OBGYN or with medical oncology. We discussed the need for sun protection in the treated area.  She can always call me with questions.  I will follow up with her on an as needed basis.     Thea Silversmith, MD

## 2014-07-26 ENCOUNTER — Ambulatory Visit (HOSPITAL_COMMUNITY)
Admission: RE | Admit: 2014-07-26 | Discharge: 2014-07-26 | Disposition: A | Discharge status: Home / Self Care | Payer: Medicare Other | Source: Ambulatory Visit | Attending: Adult Health | Admitting: Adult Health

## 2014-07-26 DIAGNOSIS — E2839 Other primary ovarian failure: Secondary | ICD-10-CM | POA: Diagnosis not present

## 2014-07-27 ENCOUNTER — Telehealth: Payer: Self-pay | Admitting: *Deleted

## 2014-07-27 NOTE — Telephone Encounter (Signed)
Called pt unable to reach. LMOVM with results, recommended pt begin Calcium and Vitamin D and weight bearing exercise. Request pt call back to confirm message was received.

## 2014-07-27 NOTE — Telephone Encounter (Signed)
Message copied by GARNER, Aletha Halim on Wed Jul 27, 2014 10:12 AM ------      Message from: Minette Headland      Created: Tue Jul 26, 2014 10:10 AM       Please call patient with results.  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  I recommend Calcium Vitamin D and weight bearing exercise.            Thanks,       Dooly      ----- Message -----         From: Rad Results In Interface         Sent: 07/26/2014   9:59 AM           To: Minette Headland, NP                   ------

## 2014-08-08 ENCOUNTER — Other Ambulatory Visit: Payer: Self-pay

## 2014-08-08 ENCOUNTER — Encounter: Payer: Self-pay | Admitting: Hematology and Oncology

## 2014-08-08 ENCOUNTER — Telehealth: Payer: Self-pay | Admitting: Hematology and Oncology

## 2014-08-08 ENCOUNTER — Other Ambulatory Visit (HOSPITAL_BASED_OUTPATIENT_CLINIC_OR_DEPARTMENT_OTHER): Payer: Medicare Other

## 2014-08-08 ENCOUNTER — Ambulatory Visit (HOSPITAL_BASED_OUTPATIENT_CLINIC_OR_DEPARTMENT_OTHER): Payer: Medicare Other | Admitting: Hematology and Oncology

## 2014-08-08 VITALS — BP 137/56 | HR 70 | Temp 97.5°F | Resp 18 | Ht 68.0 in | Wt 190.0 lb

## 2014-08-08 DIAGNOSIS — C50419 Malignant neoplasm of upper-outer quadrant of unspecified female breast: Secondary | ICD-10-CM

## 2014-08-08 DIAGNOSIS — C50412 Malignant neoplasm of upper-outer quadrant of left female breast: Secondary | ICD-10-CM

## 2014-08-08 DIAGNOSIS — Z170 Estrogen receptor positive status [ER+]: Secondary | ICD-10-CM

## 2014-08-08 DIAGNOSIS — C50919 Malignant neoplasm of unspecified site of unspecified female breast: Secondary | ICD-10-CM

## 2014-08-08 DIAGNOSIS — C773 Secondary and unspecified malignant neoplasm of axilla and upper limb lymph nodes: Secondary | ICD-10-CM

## 2014-08-08 DIAGNOSIS — M899 Disorder of bone, unspecified: Secondary | ICD-10-CM

## 2014-08-08 DIAGNOSIS — M949 Disorder of cartilage, unspecified: Secondary | ICD-10-CM

## 2014-08-08 LAB — CBC WITH DIFFERENTIAL/PLATELET
BASO%: 0.8 % (ref 0.0–2.0)
Basophils Absolute: 0 10*3/uL (ref 0.0–0.1)
EOS%: 3.7 % (ref 0.0–7.0)
Eosinophils Absolute: 0.1 10*3/uL (ref 0.0–0.5)
HCT: 42.7 % (ref 34.8–46.6)
HGB: 14 g/dL (ref 11.6–15.9)
LYMPH%: 17.9 % (ref 14.0–49.7)
MCH: 29.2 pg (ref 25.1–34.0)
MCHC: 32.7 g/dL (ref 31.5–36.0)
MCV: 89.3 fL (ref 79.5–101.0)
MONO#: 0.4 10*3/uL (ref 0.1–0.9)
MONO%: 14.3 % — ABNORMAL HIGH (ref 0.0–14.0)
NEUT#: 2 10*3/uL (ref 1.5–6.5)
NEUT%: 63.3 % (ref 38.4–76.8)
Platelets: 168 10*3/uL (ref 145–400)
RBC: 4.78 10*6/uL (ref 3.70–5.45)
RDW: 15.6 % — ABNORMAL HIGH (ref 11.2–14.5)
WBC: 3.1 10*3/uL — ABNORMAL LOW (ref 3.9–10.3)
lymph#: 0.6 10*3/uL — ABNORMAL LOW (ref 0.9–3.3)

## 2014-08-08 LAB — COMPREHENSIVE METABOLIC PANEL (CC13)
ALT: 32 U/L (ref 0–55)
AST: 24 U/L (ref 5–34)
Albumin: 3.9 g/dL (ref 3.5–5.0)
Alkaline Phosphatase: 74 U/L (ref 40–150)
Anion Gap: 9 mEq/L (ref 3–11)
BUN: 17.6 mg/dL (ref 7.0–26.0)
CO2: 26 mEq/L (ref 22–29)
Calcium: 9.6 mg/dL (ref 8.4–10.4)
Chloride: 106 mEq/L (ref 98–109)
Creatinine: 0.9 mg/dL (ref 0.6–1.1)
Glucose: 102 mg/dl (ref 70–140)
Potassium: 4.4 mEq/L (ref 3.5–5.1)
Sodium: 141 mEq/L (ref 136–145)
Total Bilirubin: 0.4 mg/dL (ref 0.20–1.20)
Total Protein: 6.6 g/dL (ref 6.4–8.3)

## 2014-08-08 NOTE — Telephone Encounter (Signed)
, °

## 2014-08-08 NOTE — Assessment & Plan Note (Signed)
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 She had radiation therapy and has been on antiestrogen therapy with Arimidex since 06/28/2014. She has been tolerating the antiestrogen therapy very well without any major problems or concerns. She had recent bone density test which showed osteopenia involving the femur but normal bones and the radius. She had previous hysterectomy and hence she does not need Pap test.  Return to clinic in December of undergoing surveillance mammograms. Today's breast exam did not reveal any abnormalities.  Discussed the importance of physical exercise in decreasing the likelihood of breast cancer recurrence. Recommended 30 mins daily 6 days a week of either brisk walking or cycling or swimming. Encouraged patient to eat more fruits and vegetables and decrease red meat.  Patient does not do much physical activity but will stop to increase her activity and try to exercise for 30 minutes every day.

## 2014-08-08 NOTE — Progress Notes (Signed)
Patient Care Team: Stephens Shire, MD as PCP - General (Family Medicine)  DIAGNOSIS: Breast cancer of upper-outer quadrant of left female breast   Primary site: Breast   Staging method: AJCC 7th Edition   Clinical: Stage IIB (T2, N1, cM0) signed by Thea Silversmith, MD on 11/04/2013 11:07 AM   Summary: Stage IIB (T2, N1, cM0)   SUMMARY OF ONCOLOGIC HISTORY:   Breast cancer of upper-outer quadrant of left female breast   07/28/2013 Mammogram Spiculated mass at 6:00 position 3 cm, ultrasound revealed 2.6 x 2.1 x 1.5 cm second mass at 12:00 2.1 x 1.8 x 2.1 cm: 2 abnormal lymph nodes left axilla   10/29/2013 Initial Diagnosis 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 [name], E3 10 2 stage IIIa IDC grade 1, ER/PR 100%, TSH 14% HER-2 negative PET/CT 1/26 of 15 negative for metastases   12/17/2013 - 04/02/2014 Chemotherapy Taxotere Cytoxan every 3 weeks for 6 cycles   05/11/2014 - 06/27/2014 Radiation Therapy Adjuvant radiation therapy to chest wall and lymph nodes   07/06/2014 -  Anti-estrogen oral therapy Arimidex 1 mg daily    CHIEF COMPLIANT: Six-month followup of history of breast cancer  INTERVAL HISTORY: Wendy [name] is a 73 year old Caucasian lady with above-mentioned history  of left-sided breast cancer treated with mastectomy. She has been on antiestrogen therapy but with Arimidex since early August 2015. She has been tolerating it very well without any major problems or concerns. She does not have any hot flashes or muscle aches or pains. She had previous hysterectomy so she does not need Pap test. She has not been exercising or doing any outdoor activities since radiation therapy because of fatigue and issues. But mostly because she reports that she is "lazy".    REVIEW OF SYSTEMS:   Constitutional: Denies fevers, chills or abnormal weight loss Eyes: Denies  blurriness of vision Ears, nose, mouth, throat, and face: Denies mucositis or sore throat Respiratory: Denies cough, dyspnea or wheezes Cardiovascular: Denies palpitation, chest discomfort or lower extremity swelling Gastrointestinal:  Denies nausea, heartburn or change in bowel habits Skin: Denies abnormal skin rashes Lymphatics: Denies new lymphadenopathy or easy bruising Neurological:Denies numbness, tingling or new weaknesses Behavioral/Psych: Mood is stable, no new changes  Breast:  denies any pain or lumps or nodules in right breast  All other systems were reviewed with the patient and are negative.  I have reviewed the past medical history, past surgical history, social history and family history with the patient and they are unchanged from previous note.  ALLERGIES:  has No Known Allergies.  MEDICATIONS:  Current Outpatient Prescriptions  Medication Sig Dispense Refill  . anastrozole (ARIMIDEX) 1 MG tablet Take 1 tablet (1 mg total) by mouth daily.  30 tablet  1  . aspirin EC 81 MG tablet Take 81 mg by mouth daily.      [name] atorvastatin (LIPITOR) 40 MG tablet Take 20 mg by mouth at bedtime.      . Calcium Carb-Cholecalciferol (CALCIUM 600 + D PO) Take 1 tablet by mouth daily.      . Multiple Vitamin (MULTIVITAMIN WITH MINERALS) TABS tablet Take 1 tablet by mouth daily.      . niacin (NIASPAN) 500 MG CR tablet Take 500 mg by mouth every other day. At bedtime      . NITROSTAT 0.4 MG SL tablet       .  omega-3 acid ethyl esters (LOVAZA) 1 G capsule Take 1 g by mouth daily.      . Potassium 99 MG TABS Take 1 tablet by mouth daily.      [name] UNABLE TO FIND Rx: L8000- Mastectomy Bra (Quantity: 2) Dx: 174.9; Left Mastectomy       No current facility-administered medications for this visit.    PHYSICAL EXAMINATION: ECOG PERFORMANCE STATUS: 0 - Asymptomatic  Filed Vitals:   08/08/14 0946  BP: 137/56  Pulse: 70  Temp: 97.5 F (36.4 C)  Resp: 18   Filed Weights   08/08/14 0946   Weight: 190 lb (86.183 kg)    GENERAL:alert, no distress and comfortable SKIN: skin color, texture, turgor are normal, no rashes or significant lesions EYES: normal, Conjunctiva are pink and non-injected, sclera clear OROPHARYNX:no exudate, no erythema and lips, buccal mucosa, and tongue normal  NECK: supple, thyroid normal size, non-tender, without nodularity LYMPH:  no palpable lymphadenopathy in the cervical, axillary or inguinal LUNGS: clear to auscultation and percussion with normal breathing effort HEART: regular rate & rhythm and no murmurs and no lower extremity edema ABDOMEN:abdomen soft, non-tender and normal bowel sounds Musculoskeletal:no cyanosis of digits and no clubbing  NEURO: alert & oriented x 3 with fluent speech, no focal motor/sensory deficits BREAST: No palpable masses or nodules in either right breast. No palpable axillary supraclavicular or infraclavicular adenopathy no breast tenderness or nipple discharge. left breast scar is intact.    LABORATORY DATA:  I have reviewed the data as listed   Chemistry      Component Value Date/Time   NA 141 08/08/2014 0939   NA 143 12/10/2013 1150   K 4.4 08/08/2014 0939   K 3.7 12/10/2013 1150   CL 102 12/10/2013 1150   CO2 26 08/08/2014 0939   CO2 28 12/10/2013 1150   BUN 17.6 08/08/2014 0939   BUN 23 12/10/2013 1150   CREATININE 0.9 08/08/2014 0939   CREATININE 0.99 12/10/2013 1150      Component Value Date/Time   CALCIUM 9.6 08/08/2014 0939   CALCIUM 9.6 12/10/2013 1150   ALKPHOS 74 08/08/2014 0939   ALKPHOS 60 12/10/2013 1150   AST 24 08/08/2014 0939   AST 26 12/10/2013 1150   ALT 32 08/08/2014 0939   ALT 39* 12/10/2013 1150   BILITOT 0.40 08/08/2014 0939   BILITOT 0.3 12/10/2013 1150       Lab Results  Component Value Date   WBC 3.1* 08/08/2014   HGB 14.0 08/08/2014   HCT 42.7 08/08/2014   MCV 89.3 08/08/2014   PLT 168 08/08/2014   NEUTROABS 2.0 08/08/2014     RADIOGRAPHIC STUDIES: I have personally reviewed the  radiology reports and agreed with their findings. No results found.   ASSESSMENT & PLAN:  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 She had radiation therapy and has been on antiestrogen therapy with Arimidex since 06/28/2014. She has been tolerating the antiestrogen therapy very well without any major problems or concerns. She had recent bone density test which showed osteopenia involving the femur but normal bones and the radius. She had previous hysterectomy and hence she does not need Pap test.  Return to clinic in December of undergoing surveillance mammograms. Today's breast exam did not reveal any abnormalities.  Discussed the importance of  physical exercise in decreasing the likelihood of breast cancer recurrence. Recommended 30 mins daily 6 days a week of either brisk walking or cycling or swimming. Encouraged patient to eat more fruits and vegetables and decrease red meat.  Patient does not do much physical activity but will stop to increase her activity and try to exercise for 30 minutes every day.   Orders Placed This Encounter  Procedures  . MM Digital Diagnostic Unilat R    Standing Status: Future     Number of Occurrences:      Standing Expiration Date: 08/08/2015    Order Specific Question:  Reason for Exam (SYMPTOM  OR DIAGNOSIS REQUIRED)    Answer:  H/o left breast cancer; mastectomy; Annual follow up of Right breast    Order Specific Question:  Preferred imaging location?    Answer:  [facility]  . CBC with Differential    Standing Status: Future     Number of Occurrences:      Standing Expiration Date: 08/08/2015   The patient has a good understanding of the overall plan. she agrees with it. She will call with any problems that may develop before her  next visit here.  I spent 25 minutes counseling the patient face to face. The total time spent in the appointment was 30 minutes and more than 50% was on counseling and review of test results    Wendy Eisenmenger, MD 08/08/2014 10:36 AM

## 2014-08-24 ENCOUNTER — Telehealth: Payer: Self-pay

## 2014-08-24 DIAGNOSIS — C50412 Malignant neoplasm of upper-outer quadrant of left female breast: Secondary | ICD-10-CM

## 2014-08-24 NOTE — Telephone Encounter (Signed)
Pt called requesting prescription for lymphedema sleeve.  Intermittent swelling in left arm only.  Denies pain, change in temp or color.  Referral order sent to lymphedema cancer rehab,

## 2014-08-26 ENCOUNTER — Ambulatory Visit: Payer: Medicare Other | Attending: Hematology and Oncology | Admitting: Physical Therapy

## 2014-08-26 DIAGNOSIS — C969 Malignant neoplasm of lymphoid, hematopoietic and related tissue, unspecified: Secondary | ICD-10-CM | POA: Insufficient documentation

## 2014-08-26 DIAGNOSIS — Z923 Personal history of irradiation: Secondary | ICD-10-CM | POA: Diagnosis not present

## 2014-08-26 DIAGNOSIS — I890 Lymphedema, not elsewhere classified: Secondary | ICD-10-CM | POA: Diagnosis not present

## 2014-08-26 DIAGNOSIS — Z5189 Encounter for other specified aftercare: Secondary | ICD-10-CM | POA: Diagnosis not present

## 2014-08-26 DIAGNOSIS — Z9012 Acquired absence of left breast and nipple: Secondary | ICD-10-CM | POA: Diagnosis not present

## 2014-08-26 DIAGNOSIS — C50919 Malignant neoplasm of unspecified site of unspecified female breast: Secondary | ICD-10-CM | POA: Diagnosis not present

## 2014-08-26 DIAGNOSIS — Z9221 Personal history of antineoplastic chemotherapy: Secondary | ICD-10-CM | POA: Insufficient documentation

## 2014-08-30 ENCOUNTER — Ambulatory Visit: Payer: Medicare Other | Admitting: Physical Therapy

## 2014-09-02 ENCOUNTER — Ambulatory Visit: Payer: Medicare Other | Admitting: Physical Therapy

## 2014-09-02 DIAGNOSIS — Z5189 Encounter for other specified aftercare: Secondary | ICD-10-CM | POA: Diagnosis not present

## 2014-09-05 ENCOUNTER — Ambulatory Visit: Payer: Medicare Other | Admitting: Physical Therapy

## 2014-09-05 DIAGNOSIS — Z5189 Encounter for other specified aftercare: Secondary | ICD-10-CM | POA: Diagnosis not present

## 2014-09-07 ENCOUNTER — Ambulatory Visit: Payer: Medicare Other | Admitting: Physical Therapy

## 2014-09-07 DIAGNOSIS — Z5189 Encounter for other specified aftercare: Secondary | ICD-10-CM | POA: Diagnosis not present

## 2014-09-13 ENCOUNTER — Ambulatory Visit: Payer: Medicare Other | Admitting: Physical Therapy

## 2014-09-13 DIAGNOSIS — Z5189 Encounter for other specified aftercare: Secondary | ICD-10-CM | POA: Diagnosis not present

## 2014-09-14 ENCOUNTER — Encounter: Payer: Medicare Other | Admitting: Physical Therapy

## 2014-09-19 ENCOUNTER — Ambulatory Visit: Payer: Medicare Other

## 2014-09-20 ENCOUNTER — Telehealth: Payer: Self-pay

## 2014-09-20 NOTE — Telephone Encounter (Signed)
Discharge summary rcvd from [facility] outpatient rehab.  Copy to Dr Verdie Drown.  Original to scan.

## 2014-09-22 ENCOUNTER — Other Ambulatory Visit: Payer: Self-pay | Admitting: *Deleted

## 2014-09-22 DIAGNOSIS — C50412 Malignant neoplasm of upper-outer quadrant of left female breast: Secondary | ICD-10-CM

## 2014-09-22 MED ORDER — ANASTROZOLE 1 MG PO TABS: 1.0000 mg | ORAL_TABLET | Freq: Every day | ORAL | Status: DC

## 2014-10-11 ENCOUNTER — Telehealth: Payer: Self-pay

## 2014-10-11 NOTE — Telephone Encounter (Signed)
Office notes rcvd from [facility] dtd 10/07/14 Dr [name].  Copy to Dr. [name].  Original to scan.

## 2014-11-01 ENCOUNTER — Other Ambulatory Visit (HOSPITAL_COMMUNITY): Payer: Self-pay | Admitting: Family Medicine

## 2014-11-01 DIAGNOSIS — Z1231 Encounter for screening mammogram for malignant neoplasm of breast: Secondary | ICD-10-CM

## 2014-11-08 ENCOUNTER — Encounter (HOSPITAL_COMMUNITY): Payer: Medicare Other

## 2014-11-09 ENCOUNTER — Ambulatory Visit (HOSPITAL_COMMUNITY)
Admission: RE | Admit: 2014-11-09 | Discharge: 2014-11-09 | Disposition: A | Discharge status: Home / Self Care | Payer: Medicare Other | Source: Ambulatory Visit | Attending: Family Medicine | Admitting: Family Medicine

## 2014-11-09 DIAGNOSIS — Z1231 Encounter for screening mammogram for malignant neoplasm of breast: Secondary | ICD-10-CM

## 2014-11-09 DIAGNOSIS — Z9012 Acquired absence of left breast and nipple: Secondary | ICD-10-CM | POA: Insufficient documentation

## 2014-11-10 ENCOUNTER — Telehealth: Payer: Self-pay | Admitting: Hematology and Oncology

## 2014-11-10 ENCOUNTER — Ambulatory Visit (HOSPITAL_BASED_OUTPATIENT_CLINIC_OR_DEPARTMENT_OTHER): Payer: Medicare Other | Admitting: Hematology and Oncology

## 2014-11-10 ENCOUNTER — Other Ambulatory Visit (HOSPITAL_BASED_OUTPATIENT_CLINIC_OR_DEPARTMENT_OTHER): Payer: Medicare Other

## 2014-11-10 DIAGNOSIS — C50412 Malignant neoplasm of upper-outer quadrant of left female breast: Secondary | ICD-10-CM | POA: Diagnosis not present

## 2014-11-10 DIAGNOSIS — Z170 Estrogen receptor positive status [ER+]: Secondary | ICD-10-CM | POA: Diagnosis not present

## 2014-11-10 DIAGNOSIS — C773 Secondary and unspecified malignant neoplasm of axilla and upper limb lymph nodes: Secondary | ICD-10-CM

## 2014-11-10 DIAGNOSIS — C50812 Malignant neoplasm of overlapping sites of left female breast: Secondary | ICD-10-CM

## 2014-11-10 LAB — CBC WITH DIFFERENTIAL/PLATELET
BASO%: 1.1 % (ref 0.0–2.0)
Basophils Absolute: 0 10*3/uL (ref 0.0–0.1)
EOS%: 3.9 % (ref 0.0–7.0)
Eosinophils Absolute: 0.2 10*3/uL (ref 0.0–0.5)
HCT: 42.3 % (ref 34.8–46.6)
HGB: 13.8 g/dL (ref 11.6–15.9)
LYMPH%: 21.3 % (ref 14.0–49.7)
MCH: 29.5 pg (ref 25.1–34.0)
MCHC: 32.6 g/dL (ref 31.5–36.0)
MCV: 90.5 fL (ref 79.5–101.0)
MONO#: 0.6 10*3/uL (ref 0.1–0.9)
MONO%: 13.4 % (ref 0.0–14.0)
NEUT#: 2.5 10*3/uL (ref 1.5–6.5)
NEUT%: 60.3 % (ref 38.4–76.8)
Platelets: 184 10*3/uL (ref 145–400)
RBC: 4.67 10*6/uL (ref 3.70–5.45)
RDW: 14.5 % (ref 11.2–14.5)
WBC: 4.2 10*3/uL (ref 3.9–10.3)
lymph#: 0.9 10*3/uL (ref 0.9–3.3)

## 2014-11-10 NOTE — Telephone Encounter (Signed)
per pof to sch pt appt-gave pt copy of sch °

## 2014-11-10 NOTE — Assessment & Plan Note (Signed)
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 She had radiation therapy and has been on antiestrogen therapy with Arimidex since 06/28/2014  Arimidex toxicities: Occasional hot flashes but otherwise no problems or symptoms.  Breast cancer surveillance: 1. Breast exam 08/08/2014 normal 2. Mammogram 11/09/2014 normal  Follow-up in 6 months with labs.

## 2014-11-10 NOTE — Progress Notes (Signed)
Patient Care Team: Stephens Shire, MD as PCP - General (Family Medicine)  DIAGNOSIS: Breast cancer of upper-outer quadrant of left female breast   Staging form: Breast, AJCC 7th Edition     Clinical: Stage IIB (T2, N1, cM0) - Signed by Thea Silversmith, MD on 11/04/2013   SUMMARY OF ONCOLOGIC HISTORY:   Breast cancer of upper-outer quadrant of left female breast   07/28/2013 Mammogram Spiculated mass at 6:00 position 3 cm, ultrasound revealed 2.6 x 2.1 x 1.5 cm second mass at 12:00 2.1 x 1.8 x 2.1 cm: 2 abnormal lymph nodes left axilla   10/29/2013 Initial Diagnosis 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 [name], E3 10 2 stage IIIa IDC grade 1, ER/PR 100%, TSH 14% HER-2 negative PET/CT 1/26 of 15 negative for metastases   12/17/2013 - 04/02/2014 Chemotherapy Taxotere Cytoxan every 3 weeks for 6 cycles   05/11/2014 - 06/27/2014 Radiation Therapy Adjuvant radiation therapy to chest wall and lymph nodes   07/06/2014 -  Anti-estrogen oral therapy Arimidex 1 mg daily    CHIEF COMPLIANT: Follow-up on Arimidex  INTERVAL HISTORY: Wendy [name] is a 73 year old Caucasian female with above-mentioned history of left-sided breast cancer treated with mastectomy followed by adjuvant chemotherapy and radiation started Arimidex in August 2015. She is tolerating Arimidex extremely well without any major problems. This is a three-month follow-up. She reports no hot flashes or muscle aches or pains.  REVIEW OF SYSTEMS:   Constitutional: Denies fevers, chills or abnormal weight loss Eyes: Denies blurriness of vision Ears, nose, mouth, throat, and face: Denies mucositis or sore throat Respiratory: Denies cough, dyspnea or wheezes Cardiovascular: Denies palpitation, chest discomfort or lower extremity swelling Gastrointestinal:  Denies nausea, heartburn or change in bowel  habits Skin: Denies abnormal skin rashes Lymphatics: Denies new lymphadenopathy or easy bruising Neurological:Denies numbness, tingling or new weaknesses Behavioral/Psych: Mood is stable, no new changes  Breast:  denies any pain or lumps or nodules in either breasts All other systems were reviewed with the patient and are negative.  I have reviewed the past medical history, past surgical history, social history and family history with the patient and they are unchanged from previous note.  ALLERGIES:  has No Known Allergies.  MEDICATIONS:  Current Outpatient Prescriptions  Medication Sig Dispense Refill  . anastrozole (ARIMIDEX) 1 MG tablet Take 1 tablet (1 mg total) by mouth daily. 30 tablet 12  . aspirin EC 81 MG tablet Take 81 mg by mouth daily.    [name] atorvastatin (LIPITOR) 40 MG tablet Take 20 mg by mouth at bedtime.    . Calcium Carb-Cholecalciferol (CALCIUM 600 + D PO) Take 1 tablet by mouth daily.    . Multiple Vitamin (MULTIVITAMIN WITH MINERALS) TABS tablet Take 1 tablet by mouth daily.    . niacin (NIASPAN) 500 MG CR tablet Take 500 mg by mouth every other day. At bedtime    . NITROSTAT 0.4 MG SL tablet     . omega-3 acid ethyl esters (LOVAZA) 1 G capsule Take 1 g by mouth daily.    . Potassium 99 MG TABS Take 1 tablet by mouth daily.    [name] UNABLE TO FIND Rx: L8000- Mastectomy Bra (Quantity: 2) Dx: 174.9; Left Mastectomy     No current facility-administered medications for this visit.    PHYSICAL EXAMINATION: ECOG PERFORMANCE STATUS: 0 - Asymptomatic  Filed Vitals:  11/10/14 1114  BP: 140/97  Pulse: 87  Temp: 98.2 F (36.8 C)  Resp: 18   Filed Weights   11/10/14 1114  Weight: 189 lb 3.2 oz (85.821 kg)    GENERAL:alert, no distress and comfortable SKIN: skin color, texture, turgor are normal, no rashes or significant lesions EYES: normal, Conjunctiva are pink and non-injected, sclera clear OROPHARYNX:no exudate, no erythema and lips, buccal mucosa, and tongue  normal  NECK: supple, thyroid normal size, non-tender, without nodularity LYMPH:  no palpable lymphadenopathy in the cervical, axillary or inguinal LUNGS: clear to auscultation and percussion with normal breathing effort HEART: regular rate & rhythm and no murmurs and no lower extremity edema ABDOMEN:abdomen soft, non-tender and normal bowel sounds Musculoskeletal:no cyanosis of digits and no clubbing  NEURO: alert & oriented x 3 with fluent speech, no focal motor/sensory deficits  LABORATORY DATA:  I have reviewed the data as listed   Chemistry      Component Value Date/Time   NA 141 08/08/2014 0939   NA 143 12/10/2013 1150   K 4.4 08/08/2014 0939   K 3.7 12/10/2013 1150   CL 102 12/10/2013 1150   CO2 26 08/08/2014 0939   CO2 28 12/10/2013 1150   BUN 17.6 08/08/2014 0939   BUN 23 12/10/2013 1150   CREATININE 0.9 08/08/2014 0939   CREATININE 0.99 12/10/2013 1150      Component Value Date/Time   CALCIUM 9.6 08/08/2014 0939   CALCIUM 9.6 12/10/2013 1150   ALKPHOS 74 08/08/2014 0939   ALKPHOS 60 12/10/2013 1150   AST 24 08/08/2014 0939   AST 26 12/10/2013 1150   ALT 32 08/08/2014 0939   ALT 39* 12/10/2013 1150   BILITOT 0.40 08/08/2014 0939   BILITOT 0.3 12/10/2013 1150       Lab Results  Component Value Date   WBC 4.2 11/10/2014   HGB 13.8 11/10/2014   HCT 42.3 11/10/2014   MCV 90.5 11/10/2014   PLT 184 11/10/2014   NEUTROABS 2.5 11/10/2014     RADIOGRAPHIC STUDIES: I have personally reviewed the radiology reports and agreed with their findings. Mm Digital Screening Unilat R  11/09/2014   CLINICAL DATA:  Screening.  EXAM: DIGITAL SCREENING UNILATERAL RIGHT MAMMOGRAM WITH CAD  COMPARISON:  Previous exam(s)  ACR Breast Density Category c: The breast tissue is heterogeneously dense, which may obscure small masses.  FINDINGS: The patient has had a left mastectomy. There are no findings suspicious for malignancy. Images were processed with CAD.  IMPRESSION: No  mammographic evidence of malignancy. A result letter of this screening mammogram will be mailed directly to the patient.  RECOMMENDATION: Screening mammogram in one year. (Code:SM-B-01Y)  BI-RADS CATEGORY  1: Negative.   Electronically Signed   By: Pamelia Hoit M.D.   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 She had radiation therapy and has been on antiestrogen therapy with Arimidex since 06/28/2014  Arimidex toxicities: Occasional hot flashes but otherwise no problems or symptoms.  Breast cancer surveillance: 1. Breast exam 08/08/2014 normal 2. Mammogram 11/09/2014 normal  Follow-up in 6 months with labs.   Orders Placed This Encounter  Procedures  . CBC with Differential    Standing Status: Future     Number of Occurrences:  Standing Expiration Date: 11/10/2015  . Comprehensive metabolic panel (Cmet) - [facility]    Standing Status: Future     Number of Occurrences:      Standing Expiration Date: 11/10/2015   The patient has a good understanding of the overall plan. she agrees with it. She will call with any problems that may develop before her next visit here.   Rulon Eisenmenger, MD 11/10/2014 12:50 PM

## 2015-05-15 ENCOUNTER — Ambulatory Visit (HOSPITAL_BASED_OUTPATIENT_CLINIC_OR_DEPARTMENT_OTHER): Payer: Medicare Other | Admitting: Hematology and Oncology

## 2015-05-15 ENCOUNTER — Other Ambulatory Visit (HOSPITAL_BASED_OUTPATIENT_CLINIC_OR_DEPARTMENT_OTHER): Payer: Medicare Other

## 2015-05-15 ENCOUNTER — Telehealth: Payer: Self-pay | Admitting: Hematology and Oncology

## 2015-05-15 VITALS — BP 153/71 | HR 74 | Temp 97.7°F | Resp 18 | Ht 68.0 in | Wt 187.1 lb

## 2015-05-15 DIAGNOSIS — Z79811 Long term (current) use of aromatase inhibitors: Secondary | ICD-10-CM | POA: Diagnosis not present

## 2015-05-15 DIAGNOSIS — C50412 Malignant neoplasm of upper-outer quadrant of left female breast: Secondary | ICD-10-CM

## 2015-05-15 DIAGNOSIS — I10 Essential (primary) hypertension: Secondary | ICD-10-CM | POA: Diagnosis not present

## 2015-05-15 LAB — COMPREHENSIVE METABOLIC PANEL (CC13)
ALT: 48 U/L (ref 0–55)
AST: 36 U/L — ABNORMAL HIGH (ref 5–34)
Albumin: 4.2 g/dL (ref 3.5–5.0)
Alkaline Phosphatase: 82 U/L (ref 40–150)
Anion Gap: 7 mEq/L (ref 3–11)
BUN: 16.7 mg/dL (ref 7.0–26.0)
CO2: 28 mEq/L (ref 22–29)
Calcium: 10.3 mg/dL (ref 8.4–10.4)
Chloride: 106 mEq/L (ref 98–109)
Creatinine: 0.9 mg/dL (ref 0.6–1.1)
EGFR: 64 mL/min/{1.73_m2} — ABNORMAL LOW (ref >90)
Glucose: 88 mg/dl (ref 70–140)
Potassium: 4.3 mEq/L (ref 3.5–5.1)
Sodium: 142 mEq/L (ref 136–145)
Total Bilirubin: 0.66 mg/dL (ref 0.20–1.20)
Total Protein: 6.6 g/dL (ref 6.4–8.3)

## 2015-05-15 LAB — CBC WITH DIFFERENTIAL/PLATELET
BASO%: 1.1 % (ref 0.0–2.0)
Basophils Absolute: 0 10*3/uL (ref 0.0–0.1)
EOS%: 4.5 % (ref 0.0–7.0)
Eosinophils Absolute: 0.2 10*3/uL (ref 0.0–0.5)
HCT: 42 % (ref 34.8–46.6)
HGB: 13.9 g/dL (ref 11.6–15.9)
LYMPH%: 20.5 % (ref 14.0–49.7)
MCH: 30.1 pg (ref 25.1–34.0)
MCHC: 33.1 g/dL (ref 31.5–36.0)
MCV: 90.9 fL (ref 79.5–101.0)
MONO#: 0.6 10*3/uL (ref 0.1–0.9)
MONO%: 13.9 % (ref 0.0–14.0)
NEUT#: 2.5 10*3/uL (ref 1.5–6.5)
NEUT%: 60 % (ref 38.4–76.8)
Platelets: 164 10*3/uL (ref 145–400)
RBC: 4.62 10*6/uL (ref 3.70–5.45)
RDW: 14.4 % (ref 11.2–14.5)
WBC: 4.1 10*3/uL (ref 3.9–10.3)
lymph#: 0.8 10*3/uL — ABNORMAL LOW (ref 0.9–3.3)

## 2015-05-15 NOTE — Telephone Encounter (Signed)
Appointments made and avs printed for patient °

## 2015-05-15 NOTE — Assessment & Plan Note (Signed)
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 She had radiation therapy and has been on antiestrogen therapy with Arimidex since 06/28/2014  Arimidex toxicities: Occasional hot flashes but otherwise no problems or symptoms.  Breast cancer surveillance: 1. Breast exam 05/15/2015 normal, left mastectomy scar and axilla were palpated left arm has lymphedema sleeve 2. Mammogram 11/09/2014 normal on the right breast 3. Bone density 2015: Normal bone density  Hypertension: Patient will discuss with her primary care physician Dr. Tollie Pizza whether she should go back to taking her antihypertensives. 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  Follow-up in 6 months.

## 2015-05-15 NOTE — Progress Notes (Signed)
Patient Care Team: Stephens Shire, MD as PCP - General (Family Medicine)  DIAGNOSIS: Breast cancer of upper-outer quadrant of left female breast   Staging form: Breast, AJCC 7th Edition     Clinical: Stage IIB (T2, N1, cM0) - Signed by Thea Silversmith, MD on 11/04/2013   SUMMARY OF ONCOLOGIC HISTORY:   Breast cancer of upper-outer quadrant of left female breast   07/28/2013 Mammogram Spiculated mass at 6:00 position 3 cm, ultrasound revealed 2.6 x 2.1 x 1.5 cm second mass at 12:00 2.1 x 1.8 x 2.1 cm: 2 abnormal lymph nodes left axilla   10/29/2013 Initial Diagnosis 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 [name], E3 10 2 stage IIIa IDC grade 1, ER/PR 100%, TSH 14% HER-2 negative PET/CT 1/26 of 15 negative for metastases   12/17/2013 - 04/02/2014 Chemotherapy Taxotere Cytoxan every 3 weeks for 6 cycles   05/11/2014 - 06/27/2014 Radiation Therapy Adjuvant radiation therapy to chest wall and lymph nodes   07/06/2014 -  Anti-estrogen oral therapy Arimidex 1 mg daily    CHIEF COMPLIANT: Follow-up on Arimidex therapy  INTERVAL HISTORY: Wendy [name] is a 74 year old with above-mentioned history of left breast cancer treated with mastectomy followed by adjuvant chemotherapy radiation and is currently on Arimidex. She is tolerating Arimidex extremely well without any major problems. Denies any hot flashes or myalgias. She does have some tenderness in the left mastectomy area because it feels very tight. Apart from that she has no other complaints.  REVIEW OF SYSTEMS:   Constitutional: Denies fevers, chills or abnormal weight loss Eyes: Denies blurriness of vision Ears, nose, mouth, throat, and face: Denies mucositis or sore throat Respiratory: Denies cough, dyspnea or wheezes Cardiovascular: Denies palpitation, chest discomfort or lower extremity  swelling Gastrointestinal:  Denies nausea, heartburn or change in bowel habits Skin: Denies abnormal skin rashes Lymphatics: Denies new lymphadenopathy or easy bruising Neurological:Denies numbness, tingling or new weaknesses Behavioral/Psych: Mood is stable, no new changes  Breast: Tightness in the left chest wall All other systems were reviewed with the patient and are negative.  I have reviewed the past medical history, past surgical history, social history and family history with the patient and they are unchanged from previous note.  ALLERGIES:  has No Known Allergies.  MEDICATIONS:  Current Outpatient Prescriptions  Medication Sig Dispense Refill  . anastrozole (ARIMIDEX) 1 MG tablet Take 1 tablet (1 mg total) by mouth daily. 30 tablet 12  . aspirin EC 81 MG tablet Take 81 mg by mouth daily.    [name] atorvastatin (LIPITOR) 40 MG tablet Take 20 mg by mouth at bedtime.    . Calcium Carb-Cholecalciferol (CALCIUM 600 + D PO) Take 1 tablet by mouth daily.    . Multiple Vitamin (MULTIVITAMIN WITH MINERALS) TABS tablet Take 1 tablet by mouth daily.    . niacin (NIASPAN) 500 MG CR tablet Take 500 mg by mouth every other day. At bedtime    . NITROSTAT 0.4 MG SL tablet     . omega-3 acid ethyl esters (LOVAZA) 1 G capsule Take 1 g by mouth daily.    . Potassium 99 MG TABS Take 1 tablet by mouth daily.    [name] UNABLE TO FIND Rx: L8000- Mastectomy Bra (Quantity: 2) Dx: 174.9; Left Mastectomy     No current facility-administered medications for this visit.    PHYSICAL EXAMINATION: ECOG PERFORMANCE STATUS: 0 -  Asymptomatic  Filed Vitals:   05/15/15 1327  BP: 153/71  Pulse: 74  Temp: 97.7 F (36.5 C)  Resp: 18   Filed Weights   05/15/15 1327  Weight: 187 lb 1.6 oz (84.868 kg)    GENERAL:alert, no distress and comfortable SKIN: skin color, texture, turgor are normal, no rashes or significant lesions EYES: normal, Conjunctiva are pink and non-injected, sclera clear OROPHARYNX:no  exudate, no erythema and lips, buccal mucosa, and tongue normal  NECK: supple, thyroid normal size, non-tender, without nodularity LYMPH:  no palpable lymphadenopathy in the cervical, axillary or inguinal LUNGS: clear to auscultation and percussion with normal breathing effort HEART: regular rate & rhythm and no murmurs and no lower extremity edema ABDOMEN:abdomen soft, non-tender and normal bowel sounds Musculoskeletal:no cyanosis of digits and no clubbing  NEURO: alert & oriented x 3 with fluent speech, no focal motor/sensory deficits BREAST no palpable lumps or nodules in the right breast or axilla, left chest wall scar is intact without any nodularity nor left axillary lymphadenopathy. (exam performed in the presence of a chaperone)  LABORATORY DATA:  I have reviewed the data as listed   Chemistry      Component Value Date/Time   NA 142 05/15/2015 1308   NA 143 12/10/2013 1150   K 4.3 05/15/2015 1308   K 3.7 12/10/2013 1150   CL 102 12/10/2013 1150   CO2 28 05/15/2015 1308   CO2 28 12/10/2013 1150   BUN 16.7 05/15/2015 1308   BUN 23 12/10/2013 1150   CREATININE 0.9 05/15/2015 1308   CREATININE 0.99 12/10/2013 1150      Component Value Date/Time   CALCIUM 10.3 05/15/2015 1308   CALCIUM 9.6 12/10/2013 1150   ALKPHOS 82 05/15/2015 1308   ALKPHOS 60 12/10/2013 1150   AST 36* 05/15/2015 1308   AST 26 12/10/2013 1150   ALT 48 05/15/2015 1308   ALT 39* 12/10/2013 1150   BILITOT 0.66 05/15/2015 1308   BILITOT 0.3 12/10/2013 1150       Lab Results  Component Value Date   WBC 4.1 05/15/2015   HGB 13.9 05/15/2015   HCT 42.0 05/15/2015   MCV 90.9 05/15/2015   PLT 164 05/15/2015   NEUTROABS 2.5 05/15/2015   ASSESSMENT & PLAN:  Breast cancer of upper-outer quadrant of left female breast Left breast cancer T3, N2, 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 She had radiation therapy and has been on antiestrogen therapy with Arimidex since 06/28/2014  Arimidex toxicities: Occasional hot flashes but otherwise no problems or symptoms.  Breast cancer surveillance: 1. Breast exam 05/15/2015 normal, left mastectomy scar and axilla were palpated left arm has lymphedema sleeve 2. Mammogram 11/09/2014 normal on the right breast 3. Bone density 2015: Normal bone density  Hypertension: Patient will discuss with her primary care physician Dr. Tollie Pizza whether she should go back to taking her antihypertensives. 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  Follow-up in 6 months.    No orders of the defined types were placed in this encounter.   The patient has a good understanding of the overall plan. she agrees with it. she will call with any problems that may develop before the next visit here.   Rulon Eisenmenger, MD

## 2015-05-15 NOTE — Addendum Note (Signed)
Addended by: Prentiss Bells on: 05/15/2015 02:18 PM   Modules accepted: Orders, Medications

## 2015-10-10 ENCOUNTER — Other Ambulatory Visit: Payer: Self-pay | Admitting: Hematology and Oncology

## 2015-10-10 DIAGNOSIS — Z1231 Encounter for screening mammogram for malignant neoplasm of breast: Secondary | ICD-10-CM

## 2015-10-11 NOTE — Telephone Encounter (Signed)
Chart reviewed.

## 2015-11-12 NOTE — Assessment & Plan Note (Signed)
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 She had radiation therapy and has been on antiestrogen therapy with Arimidex since 06/28/2014  Arimidex toxicities: Occasional hot flashes but otherwise no problems or symptoms.  Breast cancer surveillance: 1. Breast exam 05/15/2015 normal, left mastectomy scar and axilla were palpated left arm has lymphedema sleeve 2. Mammogram 11/09/2014 normal on the right breast 3. Bone density 2015: Normal bone density  Hypertension: Patient will discuss with her primary care physician Dr. Tollie Pizza whether she should go back to taking her antihypertensives. 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  Follow-up in 6 months.

## 2015-11-13 ENCOUNTER — Encounter: Payer: Self-pay | Admitting: Hematology and Oncology

## 2015-11-13 ENCOUNTER — Telehealth: Payer: Self-pay | Admitting: Hematology and Oncology

## 2015-11-13 ENCOUNTER — Ambulatory Visit (HOSPITAL_COMMUNITY)
Admission: RE | Admit: 2015-11-13 | Discharge: 2015-11-13 | Disposition: A | Discharge status: Home / Self Care | Payer: Medicare Other | Source: Ambulatory Visit | Attending: Hematology and Oncology | Admitting: Hematology and Oncology

## 2015-11-13 ENCOUNTER — Ambulatory Visit (HOSPITAL_BASED_OUTPATIENT_CLINIC_OR_DEPARTMENT_OTHER): Payer: Medicare Other | Admitting: Hematology and Oncology

## 2015-11-13 VITALS — BP 153/64 | HR 73 | Temp 98.1°F | Resp 18 | Ht 68.0 in | Wt 188.8 lb

## 2015-11-13 DIAGNOSIS — I10 Essential (primary) hypertension: Secondary | ICD-10-CM | POA: Diagnosis not present

## 2015-11-13 DIAGNOSIS — C50412 Malignant neoplasm of upper-outer quadrant of left female breast: Secondary | ICD-10-CM | POA: Diagnosis present

## 2015-11-13 DIAGNOSIS — Z853 Personal history of malignant neoplasm of breast: Secondary | ICD-10-CM | POA: Insufficient documentation

## 2015-11-13 DIAGNOSIS — Z1231 Encounter for screening mammogram for malignant neoplasm of breast: Secondary | ICD-10-CM | POA: Diagnosis not present

## 2015-11-13 DIAGNOSIS — Z9012 Acquired absence of left breast and nipple: Secondary | ICD-10-CM | POA: Diagnosis not present

## 2015-11-13 NOTE — Progress Notes (Signed)
Patient Care Team: Stephens Shire, MD as PCP - General (Family Medicine)  DIAGNOSIS: Breast cancer of upper-outer quadrant of left female breast [facility])   Staging form: Breast, AJCC 7th Edition     Clinical: Stage IIB (T2, N1, cM0) - Signed by Thea Silversmith, MD on 11/04/2013   SUMMARY OF ONCOLOGIC HISTORY:   Breast cancer of upper-outer quadrant of left female breast ([place])   07/28/2013 Mammogram Spiculated mass at 6:00 position 3 cm, ultrasound revealed 2.6 x 2.1 x 1.5 cm second mass at 12:00 2.1 x 1.8 x 2.1 cm: 2 abnormal lymph nodes left axilla   10/29/2013 Initial Diagnosis 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 [name], E3 10 2 stage IIIa IDC grade 1, ER/PR 100%, TSH 14% HER-2 negative PET/CT 1/26 of 15 negative for metastases   12/17/2013 - 04/02/2014 Chemotherapy Taxotere Cytoxan every 3 weeks for 6 cycles   05/11/2014 - 06/27/2014 Radiation Therapy Adjuvant radiation therapy to chest wall and lymph nodes   07/06/2014 -  Anti-estrogen oral therapy Arimidex 1 mg daily    CHIEF COMPLIANT: Follow-up on Arimidex  INTERVAL HISTORY: Wendy [name] is a 74 year old with above-mentioned left breast cancer currently on Arimidex therapy and appears to be tolerating extremely well. She does not have any major side effects from it. She has occasional hot flashes. Denies any lumps or nodules in the breast. She has mild tenderness in the left breast.  REVIEW OF SYSTEMS:   Constitutional: Denies fevers, chills or abnormal weight loss Eyes: Denies blurriness of vision Ears, nose, mouth, throat, and face: Denies mucositis or sore throat Respiratory: Denies cough, dyspnea or wheezes Cardiovascular: Denies palpitation, chest discomfort or lower extremity swelling Gastrointestinal:  Denies nausea, heartburn or change in bowel habits Skin: Denies abnormal skin  rashes Lymphatics: Denies new lymphadenopathy or easy bruising Neurological:Denies numbness, tingling or new weaknesses Behavioral/Psych: Mood is stable, no new changes  Breast: Mild left breast tenderness All other systems were reviewed with the patient and are negative.  I have reviewed the past medical history, past surgical history, social history and family history with the patient and they are unchanged from previous note.  ALLERGIES:  has No Known Allergies.  MEDICATIONS:  Current Outpatient Prescriptions  Medication Sig Dispense Refill  . anastrozole (ARIMIDEX) 1 MG tablet take 1 tablet by mouth once daily 30 tablet 11  . aspirin EC 81 MG tablet Take 81 mg by mouth daily.    [name] atorvastatin (LIPITOR) 40 MG tablet Take 20 mg by mouth at bedtime.    . Calcium Carb-Cholecalciferol (CALCIUM 600 + D PO) Take 1 tablet by mouth daily.    . Multiple Vitamin (MULTIVITAMIN WITH MINERALS) TABS tablet Take 1 tablet by mouth daily.    [name] NITROSTAT 0.4 MG SL tablet     . omega-3 acid ethyl esters (LOVAZA) 1 G capsule Take 1 g by mouth daily.    . Potassium 99 MG TABS Take 1 tablet by mouth daily.    [name] UNABLE TO FIND Rx: L8000- Mastectomy Bra (Quantity: 2) Dx: 174.9; Left Mastectomy     No current facility-administered medications for this visit.    PHYSICAL EXAMINATION: ECOG PERFORMANCE STATUS: 1 - Symptomatic but completely ambulatory  Filed Vitals:   11/13/15 1115  BP: 153/64  Pulse: 73  Temp: 98.1 F (36.7 C)  Resp: 18   Filed Weights   11/13/15  1115  Weight: 188 lb 12.8 oz (85.639 kg)    GENERAL:alert, no distress and comfortable SKIN: skin color, texture, turgor are normal, no rashes or significant lesions EYES: normal, Conjunctiva are pink and non-injected, sclera clear OROPHARYNX:no exudate, no erythema and lips, buccal mucosa, and tongue normal  NECK: supple, thyroid normal size, non-tender, without nodularity LYMPH:  no palpable lymphadenopathy in the cervical,  axillary or inguinal LUNGS: clear to auscultation and percussion with normal breathing effort HEART: regular rate & rhythm and no murmurs and no lower extremity edema ABDOMEN:abdomen soft, non-tender and normal bowel sounds Musculoskeletal:no cyanosis of digits and no clubbing  NEURO: alert & oriented x 3 with fluent speech, no focal motor/sensory deficits BREAST: No palpable masses or nodules in either right or left breasts. No palpable axillary supraclavicular or infraclavicular adenopathy no breast tenderness or nipple discharge. (exam performed in the presence of a chaperone)  LABORATORY DATA:  I have reviewed the data as listed   Chemistry      Component Value Date/Time   NA 142 05/15/2015 1308   NA 143 12/10/2013 1150   K 4.3 05/15/2015 1308   K 3.7 12/10/2013 1150   CL 102 12/10/2013 1150   CO2 28 05/15/2015 1308   CO2 28 12/10/2013 1150   BUN 16.7 05/15/2015 1308   BUN 23 12/10/2013 1150   CREATININE 0.9 05/15/2015 1308   CREATININE 0.99 12/10/2013 1150      Component Value Date/Time   CALCIUM 10.3 05/15/2015 1308   CALCIUM 9.6 12/10/2013 1150   ALKPHOS 82 05/15/2015 1308   ALKPHOS 60 12/10/2013 1150   AST 36* 05/15/2015 1308   AST 26 12/10/2013 1150   ALT 48 05/15/2015 1308   ALT 39* 12/10/2013 1150   BILITOT 0.66 05/15/2015 1308   BILITOT 0.3 12/10/2013 1150       Lab Results  Component Value Date   WBC 4.1 05/15/2015   HGB 13.9 05/15/2015   HCT 42.0 05/15/2015   MCV 90.9 05/15/2015   PLT 164 05/15/2015   NEUTROABS 2.5 05/15/2015    ASSESSMENT & PLAN:  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 She had radiation therapy and has been on antiestrogen therapy with Arimidex since 06/28/2014  Arimidex toxicities: Occasional hot flashes but  otherwise no problems or symptoms.  Breast cancer surveillance: 1. Breast exam 11/13/2015 normal, left mastectomy scar and axilla were palpated left arm has lymphedema sleeve 2. Mammogram 11/13/2015 (it was just done today and I do not have the report) 3. Bone density 2015: Normal bone density  Hypertension:  Blood pressure today is 152/71 Patient stays very busy working with hospice and I instructed her that she would benefit from regular exercise.  Follow-up in 6 months  And after that we will see her once a year.   No orders of the defined types were placed in this encounter.   The patient has a good understanding of the overall plan. she agrees with it. she will call with any problems that may develop before the next visit here.   Rulon Eisenmenger, MD 11/13/2015

## 2015-11-13 NOTE — Telephone Encounter (Signed)
Appointments made and avs printed for patient °

## 2016-01-31 ENCOUNTER — Ambulatory Visit (HOSPITAL_COMMUNITY)
Admission: RE | Admit: 2016-01-31 | Discharge: 2016-01-31 | Disposition: A | Discharge status: Home / Self Care | Payer: Medicare Other | Source: Ambulatory Visit | Attending: Vascular Surgery | Admitting: Vascular Surgery

## 2016-01-31 ENCOUNTER — Other Ambulatory Visit: Payer: Self-pay | Admitting: Cardiology

## 2016-01-31 DIAGNOSIS — I6523 Occlusion and stenosis of bilateral carotid arteries: Secondary | ICD-10-CM | POA: Insufficient documentation

## 2016-01-31 DIAGNOSIS — I10 Essential (primary) hypertension: Secondary | ICD-10-CM | POA: Diagnosis not present

## 2016-01-31 DIAGNOSIS — E785 Hyperlipidemia, unspecified: Secondary | ICD-10-CM | POA: Insufficient documentation

## 2016-05-14 ENCOUNTER — Ambulatory Visit (HOSPITAL_BASED_OUTPATIENT_CLINIC_OR_DEPARTMENT_OTHER): Payer: Medicare Other | Admitting: Hematology and Oncology

## 2016-05-14 ENCOUNTER — Encounter: Payer: Self-pay | Admitting: Hematology and Oncology

## 2016-05-14 ENCOUNTER — Telehealth: Payer: Self-pay | Admitting: Hematology and Oncology

## 2016-05-14 VITALS — BP 125/58 | HR 86 | Temp 97.8°F | Resp 18 | Wt 189.9 lb

## 2016-05-14 DIAGNOSIS — I10 Essential (primary) hypertension: Secondary | ICD-10-CM | POA: Diagnosis not present

## 2016-05-14 DIAGNOSIS — C50412 Malignant neoplasm of upper-outer quadrant of left female breast: Secondary | ICD-10-CM

## 2016-05-14 NOTE — Assessment & Plan Note (Signed)
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 She had radiation therapy and has been on antiestrogen therapy with Arimidex since 06/28/2014  Arimidex toxicities: Occasional hot flashes but otherwise no problems or symptoms.  Breast cancer surveillance: 1. Breast exam 05/14/2016 benign, left mastectomy scar and axilla were palpated left arm has lymphedema sleeve 2. Mammogram 11/13/2015: Benign 3. Bone density 2015: Normal bone density  Hypertension: Blood pressure today is 152/71 Patient stays very busy working with hospice and I instructed her that she would benefit from regular exercise.  Follow-up in 1 year

## 2016-05-14 NOTE — Telephone Encounter (Signed)
appt made and avs printed °

## 2016-05-14 NOTE — Progress Notes (Signed)
Patient Care Team: Stephens Shire, MD as PCP - General (Family Medicine)  DIAGNOSIS: Breast cancer of upper-outer quadrant of left female breast [facility])   Staging form: Breast, AJCC 7th Edition     Clinical: Stage IIB (T2, N1, cM0) - Signed by Thea Silversmith, MD on 11/04/2013   SUMMARY OF ONCOLOGIC HISTORY:   Breast cancer of upper-outer quadrant of left female breast ([place])   07/28/2013 Mammogram Spiculated mass at 6:00 position 3 cm, ultrasound revealed 2.6 x 2.1 x 1.5 cm second mass at 12:00 2.1 x 1.8 x 2.1 cm: 2 abnormal lymph nodes left axilla   10/29/2013 Initial Diagnosis 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 [name], E3 10 2 stage IIIa IDC grade 1, ER/PR 100%, TSH 14% HER-2 negative PET/CT 1/26 of 15 negative for metastases   12/17/2013 - 04/02/2014 Chemotherapy Taxotere Cytoxan every 3 weeks for 6 cycles   05/11/2014 - 06/27/2014 Radiation Therapy Adjuvant radiation therapy to chest wall and lymph nodes   07/06/2014 -  Anti-estrogen oral therapy Arimidex 1 mg daily    CHIEF COMPLIANT: Follow-up on Arimidex therapy  INTERVAL HISTORY: Wendy [name] is a 16 year with above-mentioned history of left breast cancer currently on Arimidex therapy. She appears to be tolerating Arimidex extremely well. She does not have any muscle aches or pains. Denies any lumps or nodules in the breasts. Denies any hot flashes.  REVIEW OF SYSTEMS:   Constitutional: Denies fevers, chills or abnormal weight loss Eyes: Denies blurriness of vision Ears, nose, mouth, throat, and face: Denies mucositis or sore throat Respiratory: Denies cough, dyspnea or wheezes Cardiovascular: Denies palpitation, chest discomfort Gastrointestinal:  Denies nausea, heartburn or change in bowel habits Skin: Denies abnormal skin rashes Lymphatics: Denies new lymphadenopathy or easy  bruising Neurological:Denies numbness, tingling or new weaknesses Behavioral/Psych: Mood is stable, no new changes  Extremities: No lower extremity edema Breast:  denies any pain or lumps or nodules in either breasts All other systems were reviewed with the patient and are negative.  I have reviewed the past medical history, past surgical history, social history and family history with the patient and they are unchanged from previous note.  ALLERGIES:  has No Known Allergies.  MEDICATIONS:  Current Outpatient Prescriptions  Medication Sig Dispense Refill  . anastrozole (ARIMIDEX) 1 MG tablet take 1 tablet by mouth once daily 30 tablet 11  . aspirin EC 81 MG tablet Take 81 mg by mouth daily.    [name] atorvastatin (LIPITOR) 40 MG tablet Take 20 mg by mouth at bedtime.    . Calcium Carb-Cholecalciferol (CALCIUM 600 + D PO) Take 1 tablet by mouth daily.    . Multiple Vitamin (MULTIVITAMIN WITH MINERALS) TABS tablet Take 1 tablet by mouth daily.    [name] NITROSTAT 0.4 MG SL tablet     . omega-3 acid ethyl esters (LOVAZA) 1 G capsule Take 1 g by mouth daily.    . Potassium 99 MG TABS Take 1 tablet by mouth daily.    [name] UNABLE TO FIND Rx: L8000- Mastectomy Bra (Quantity: 2) Dx: 174.9; Left Mastectomy     No current facility-administered medications for this visit.    PHYSICAL EXAMINATION: ECOG PERFORMANCE STATUS: 0 - Asymptomatic  Filed Vitals:   05/14/16 0911  BP: 125/58  Pulse: 86  Temp: 97.8 F (36.6 C)  Resp: 18   Filed Weights   05/14/16  0321  Weight: 189 lb 14.4 oz (86.138 kg)    GENERAL:alert, no distress and comfortable SKIN: skin color, texture, turgor are normal, no rashes or significant lesions EYES: normal, Conjunctiva are pink and non-injected, sclera clear OROPHARYNX:no exudate, no erythema and lips, buccal mucosa, and tongue normal  NECK: supple, thyroid normal size, non-tender, without nodularity LYMPH:  no palpable lymphadenopathy in the cervical, axillary or  inguinal LUNGS: clear to auscultation and percussion with normal breathing effort HEART: regular rate & rhythm and no murmurs and no lower extremity edema ABDOMEN:abdomen soft, non-tender and normal bowel sounds MUSCULOSKELETAL:no cyanosis of digits and no clubbing  NEURO: alert & oriented x 3 with fluent speech, no focal motor/sensory deficits EXTREMITIES: No lower extremity edema BREAST: No palpable masses or nodules in either right or left breasts. No palpable axillary supraclavicular or infraclavicular adenopathy no breast tenderness or nipple discharge. (exam performed in the presence of a chaperone)  LABORATORY DATA:  I have reviewed the data as listed   Chemistry      Component Value Date/Time   NA 142 05/15/2015 1308   NA 143 12/10/2013 1150   K 4.3 05/15/2015 1308   K 3.7 12/10/2013 1150   CL 102 12/10/2013 1150   CO2 28 05/15/2015 1308   CO2 28 12/10/2013 1150   BUN 16.7 05/15/2015 1308   BUN 23 12/10/2013 1150   CREATININE 0.9 05/15/2015 1308   CREATININE 0.99 12/10/2013 1150      Component Value Date/Time   CALCIUM 10.3 05/15/2015 1308   CALCIUM 9.6 12/10/2013 1150   ALKPHOS 82 05/15/2015 1308   ALKPHOS 60 12/10/2013 1150   AST 36* 05/15/2015 1308   AST 26 12/10/2013 1150   ALT 48 05/15/2015 1308   ALT 39* 12/10/2013 1150   BILITOT 0.66 05/15/2015 1308   BILITOT 0.3 12/10/2013 1150       Lab Results  Component Value Date   WBC 4.1 05/15/2015   HGB 13.9 05/15/2015   HCT 42.0 05/15/2015   MCV 90.9 05/15/2015   PLT 164 05/15/2015   NEUTROABS 2.5 05/15/2015     ASSESSMENT & PLAN:  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 She had radiation therapy and has been on antiestrogen therapy with Arimidex since 06/28/2014  Arimidex toxicities:  Occasional hot flashes but otherwise no problems or symptoms.  Breast cancer surveillance: 1. Breast exam 05/14/2016 benign, left mastectomy scar and axilla were palpated left arm has lymphedema sleeve 2. Mammogram 11/13/2015: Benign 3. Bone density 2015: Normal bone density  Hypertension: Blood pressure Is under good control Patient stays very busy working with hospice and I instructed her that she would benefit from regular exercise.  Follow-up in 1 year   No orders of the defined types were placed in this encounter.   The patient has a good understanding of the overall plan. she agrees with it. she will call with any problems that may develop before the next visit here.   Rulon Eisenmenger, MD 05/14/2016

## 2016-08-07 ENCOUNTER — Ambulatory Visit (HOSPITAL_BASED_OUTPATIENT_CLINIC_OR_DEPARTMENT_OTHER): Payer: Medicare Other | Admitting: Hematology and Oncology

## 2016-08-07 ENCOUNTER — Ambulatory Visit (HOSPITAL_COMMUNITY)
Admission: RE | Admit: 2016-08-07 | Discharge: 2016-08-07 | Disposition: A | Discharge status: Home / Self Care | Payer: Medicare Other | Source: Ambulatory Visit | Attending: Hematology and Oncology | Admitting: Hematology and Oncology

## 2016-08-07 ENCOUNTER — Other Ambulatory Visit: Payer: Self-pay | Admitting: *Deleted

## 2016-08-07 ENCOUNTER — Encounter: Payer: Self-pay | Admitting: Hematology and Oncology

## 2016-08-07 VITALS — BP 147/68 | HR 73 | Temp 97.9°F | Resp 18 | Ht 68.0 in | Wt 189.4 lb

## 2016-08-07 DIAGNOSIS — Z79811 Long term (current) use of aromatase inhibitors: Secondary | ICD-10-CM | POA: Diagnosis not present

## 2016-08-07 DIAGNOSIS — C50412 Malignant neoplasm of upper-outer quadrant of left female breast: Secondary | ICD-10-CM

## 2016-08-07 DIAGNOSIS — I10 Essential (primary) hypertension: Secondary | ICD-10-CM

## 2016-08-07 DIAGNOSIS — M7989 Other specified soft tissue disorders: Secondary | ICD-10-CM | POA: Diagnosis not present

## 2016-08-07 DIAGNOSIS — M25112 Fistula, left shoulder: Secondary | ICD-10-CM

## 2016-08-07 NOTE — Progress Notes (Signed)
*  PRELIMINARY RESULTS* Vascular Ultrasound Left upper extremity venous duplex has been completed.  Preliminary findings: No evidence of DVT or superficial thrombosis.  Called results to Tammy.  Landry Mellow, RDMS, RVT  08/07/2016, 3:03 PM

## 2016-08-07 NOTE — Progress Notes (Signed)
Patient Care Team: Stephens Shire, MD as PCP - General (Family Medicine)  DIAGNOSIS: Breast cancer of upper-outer quadrant of left female breast [facility])   Staging form: Breast, AJCC 7th Edition   - Clinical: Stage IIB (T2, N1, cM0) - Signed by Thea Silversmith, MD on 11/04/2013  SUMMARY OF ONCOLOGIC HISTORY:   Breast cancer of upper-outer quadrant of left female breast ([place])   07/28/2013 Mammogram    Spiculated mass at 6:00 position 3 cm, ultrasound revealed 2.6 x 2.1 x 1.5 cm second mass at 12:00 2.1 x 1.8 x 2.1 cm: 2 abnormal lymph nodes left axilla      10/29/2013 Initial Diagnosis    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 [name], T3N2 (5/10 LN positive) stage IIIa IDC grade 1, ER/PR 100%, TSH 14% HER-2 negative PET/CT 12/20/2013 negative for metastases      12/17/2013 - 04/02/2014 Chemotherapy    Taxotere Cytoxan every 3 weeks for 6 cycles      05/11/2014 - 06/27/2014 Radiation Therapy    Adjuvant radiation therapy to chest wall and lymph nodes      07/06/2014 -  Anti-estrogen oral therapy    Arimidex 1 mg daily       CHIEF COMPLIANT: Rash on the left arm  INTERVAL HISTORY: Wendy [name] is a 75 year old with above-mentioned history of left breast cancer who underwent left axillary lymph node dissection and had 5 positive lymph nodes. She is here for an urgent visit because of left arm suddenly became red and swollen and she called Dr. Talmadge Chad who called me and we are seeing her today in our office. The arm is not painful. It is not hot to touch and she does not have any fevers or chills. She has pain in the left shoulder to elevate the arm above the shoulder level.  REVIEW OF SYSTEMS:   Constitutional: Denies fevers, chills or abnormal weight loss Eyes: Denies blurriness of vision Ears, nose, mouth, throat, and face: Denies  mucositis or sore throat Respiratory: Denies cough, dyspnea or wheezes Cardiovascular: Denies palpitation, chest discomfort Gastrointestinal:  Denies nausea, heartburn or change in bowel habits Skin: Denies abnormal skin rashes Lymphatics: Denies new lymphadenopathy or easy bruising Neurological:Denies numbness, tingling or new weaknesses Behavioral/Psych: Mood is stable, no new changes  Extremities: Left arm lymphedema, and edema on the left arm Breast: Left chest wall and axilla no palpable lumps or nodules. All other systems were reviewed with the patient and are negative.  I have reviewed the past medical history, past surgical history, social history and family history with the patient and they are unchanged from previous note.  ALLERGIES:  has No Known Allergies.  MEDICATIONS:  Current Outpatient Prescriptions  Medication Sig Dispense Refill  . anastrozole (ARIMIDEX) 1 MG tablet take 1 tablet by mouth once daily 30 tablet 11  . aspirin EC 81 MG tablet Take 81 mg by mouth daily.    [name] atorvastatin (LIPITOR) 40 MG tablet Take 20 mg by mouth at bedtime.    . Calcium Carb-Cholecalciferol (CALCIUM 600 + D PO) Take 1 tablet by mouth daily.    . Multiple Vitamin (MULTIVITAMIN WITH MINERALS) TABS tablet Take 1 tablet by mouth daily.    [name] NITROSTAT 0.4 MG SL tablet     . omega-3 acid ethyl esters (LOVAZA) 1 G capsule Take 1  g by mouth daily.    . Potassium 99 MG TABS Take 1 tablet by mouth daily.    [name] UNABLE TO FIND Rx: L8000- Mastectomy Bra (Quantity: 2) Dx: 174.9; Left Mastectomy     No current facility-administered medications for this visit.     PHYSICAL EXAMINATION: ECOG PERFORMANCE STATUS: 1 - Symptomatic but completely ambulatory  Vitals:   08/07/16 1359  BP: (!) 147/68  Pulse: 73  Resp: 18  Temp: 97.9 F (36.6 C)   Filed Weights   08/07/16 1359  Weight: 189 lb 6.4 oz (85.9 kg)    GENERAL:alert, no distress and comfortable SKIN: skin color, texture, turgor are  normal, no rashes or significant lesions EYES: normal, Conjunctiva are pink and non-injected, sclera clear OROPHARYNX:no exudate, no erythema and lips, buccal mucosa, and tongue normal  NECK: supple, thyroid normal size, non-tender, without nodularity LYMPH:  no palpable lymphadenopathy in the cervical, axillary or inguinal LUNGS: clear to auscultation and percussion with normal breathing effort HEART: regular rate & rhythm and no murmurs and no lower extremity edema ABDOMEN:abdomen soft, non-tender and normal bowel sounds MUSCULOSKELETAL:no cyanosis of digits and no clubbing  NEURO: alert & oriented x 3 with fluent speech, no focal motor/sensory deficits EXTREMITIES: No lower extremity edema BREAST:No palpable lumps or nodules in the left chest wall or axilla. (exam performed in the presence of a chaperone)  LABORATORY DATA:  I have reviewed the data as listed   Chemistry      Component Value Date/Time   NA 142 05/15/2015 1308   K 4.3 05/15/2015 1308   CL 102 12/10/2013 1150   CO2 28 05/15/2015 1308   BUN 16.7 05/15/2015 1308   CREATININE 0.9 05/15/2015 1308      Component Value Date/Time   CALCIUM 10.3 05/15/2015 1308   ALKPHOS 82 05/15/2015 1308   AST 36 (H) 05/15/2015 1308   ALT 48 05/15/2015 1308   BILITOT 0.66 05/15/2015 1308       Lab Results  Component Value Date   WBC 4.1 05/15/2015   HGB 13.9 05/15/2015   HCT 42.0 05/15/2015   MCV 90.9 05/15/2015   PLT 164 05/15/2015   NEUTROABS 2.5 05/15/2015     ASSESSMENT & PLAN:  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 She had radiation therapy and has been on antiestrogen therapy with Arimidex since 06/28/2014  Arimidex toxicities: Occasional hot flashes but otherwise no problems or symptoms.  Breast  cancer surveillance: 1. Breast exam 08/07/2016 benign, left mastectomy scar and axilla were palpated left arm 2. Mammogram 11/13/2015: Benign 3. Bone density 2015: Normal bone density  Left arm swelling and redness started 08/06/2016: Ultrasound of the left arm did not show any DVT. I suspect this is related to lymphedema.  Left shoulder pain: I would like to obtain a bone scan to evaluate for bone metastases because of her high risk diagnosis.  Hypertension: Blood pressure continues to be elevated. She will discuss with her PCP.  Patient stays very busy working with hospice.  Follow-up at her next regularly scheduled appointment or sooner if the bone scan shows any abnormalities that are of concern. I will call her with the result of the bone scan.   Orders Placed This Encounter  Procedures  . NM Bone Scan Whole Body  Standing Status:   Future    Standing Expiration Date:   08/07/2017    Order Specific Question:   Reason for Exam (SYMPTOM  OR DIAGNOSIS REQUIRED)    Answer:   Left shoulder pain    Order Specific Question:   Preferred imaging location?    Answer:   [facility]    Order Specific Question:   If indicated for the ordered procedure, I authorize the administration of a radiopharmaceutical per Radiology protocol    Answer:   Yes  . [place] Extrem Up Left Comp    Standing Status:   Future    Standing Expiration Date:   10/07/2017    Order Specific Question:   Reason for Exam (SYMPTOM  OR DIAGNOSIS REQUIRED)    Answer:   Left upper extremity swelling    Order Specific Question:   Preferred imaging location?    Answer:   [facility]   The patient has a good understanding of the overall plan. she agrees with it. she will call with any problems that may develop before the next visit here.   Rulon Eisenmenger, MD 08/07/16

## 2016-08-07 NOTE — Assessment & Plan Note (Signed)
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 She had radiation therapy and has been on antiestrogen therapy with Arimidex since 06/28/2014  Arimidex toxicities: Occasional hot flashes but otherwise no problems or symptoms.  Breast cancer surveillance: 1. Breast exam 08/07/2016 benign, left mastectomy scar and axilla were palpated left arm 2. Mammogram 11/13/2015: Benign 3. Bone density 2015: Normal bone density  Left arm swelling and redness started 08/06/2016: Ultrasound of the left arm did not show any DVT. I suspect this is related to lymphedema.  Left shoulder pain: I would like to obtain a bone scan to evaluate for bone metastases because of her high risk diagnosis.  Hypertension: Blood pressure continues to be elevated. She will discuss with her PCP.  Patient stays very busy working with hospice.  Follow-up in 1 year

## 2016-08-15 ENCOUNTER — Encounter (HOSPITAL_COMMUNITY)
Admission: RE | Admit: 2016-08-15 | Discharge: 2016-08-15 | Disposition: A | Discharge status: Home / Self Care | Payer: Medicare Other | Source: Ambulatory Visit | Attending: Hematology and Oncology | Admitting: Hematology and Oncology

## 2016-08-15 DIAGNOSIS — C50412 Malignant neoplasm of upper-outer quadrant of left female breast: Secondary | ICD-10-CM | POA: Diagnosis not present

## 2016-08-15 MED ORDER — TECHNETIUM TC 99M MEDRONATE IV KIT: 25.0000 | PACK | Freq: Once | INTRAVENOUS | Status: DC | PRN

## 2016-08-19 ENCOUNTER — Telehealth: Payer: Self-pay | Admitting: Hematology and Oncology

## 2016-08-19 NOTE — Telephone Encounter (Signed)
I called and discussed the result of the bone scan with the patient. She has extensive osteoarthritis. There is no evidence of metastatic disease. Patient had previous trauma to the right rib cage.

## 2016-11-01 ENCOUNTER — Other Ambulatory Visit: Payer: Self-pay | Admitting: Hematology and Oncology

## 2016-11-20 ENCOUNTER — Other Ambulatory Visit: Payer: Self-pay | Admitting: Hematology and Oncology

## 2016-11-20 DIAGNOSIS — Z1231 Encounter for screening mammogram for malignant neoplasm of breast: Secondary | ICD-10-CM

## 2016-11-21 ENCOUNTER — Ambulatory Visit (HOSPITAL_COMMUNITY)
Admission: RE | Admit: 2016-11-21 | Discharge: 2016-11-21 | Disposition: A | Discharge status: Home / Self Care | Payer: Medicare Other | Source: Ambulatory Visit | Attending: Hematology and Oncology | Admitting: Hematology and Oncology

## 2016-11-21 DIAGNOSIS — Z1231 Encounter for screening mammogram for malignant neoplasm of breast: Secondary | ICD-10-CM | POA: Diagnosis not present

## 2017-02-18 DIAGNOSIS — J0100 Acute maxillary sinusitis, unspecified: Secondary | ICD-10-CM | POA: Diagnosis not present

## 2017-02-28 DIAGNOSIS — I2510 Atherosclerotic heart disease of native coronary artery without angina pectoris: Secondary | ICD-10-CM | POA: Diagnosis not present

## 2017-02-28 DIAGNOSIS — I10 Essential (primary) hypertension: Secondary | ICD-10-CM | POA: Diagnosis not present

## 2017-02-28 DIAGNOSIS — E785 Hyperlipidemia, unspecified: Secondary | ICD-10-CM | POA: Diagnosis not present

## 2017-02-28 DIAGNOSIS — C50112 Malignant neoplasm of central portion of left female breast: Secondary | ICD-10-CM | POA: Diagnosis not present

## 2017-02-28 DIAGNOSIS — E663 Overweight: Secondary | ICD-10-CM | POA: Diagnosis not present

## 2017-02-28 DIAGNOSIS — Z955 Presence of coronary angioplasty implant and graft: Secondary | ICD-10-CM | POA: Diagnosis not present

## 2017-02-28 DIAGNOSIS — I6529 Occlusion and stenosis of unspecified carotid artery: Secondary | ICD-10-CM | POA: Diagnosis not present

## 2017-04-16 DIAGNOSIS — J22 Unspecified acute lower respiratory infection: Secondary | ICD-10-CM | POA: Diagnosis not present

## 2017-05-05 DIAGNOSIS — H35372 Puckering of macula, left eye: Secondary | ICD-10-CM | POA: Diagnosis not present

## 2017-05-05 DIAGNOSIS — H2513 Age-related nuclear cataract, bilateral: Secondary | ICD-10-CM | POA: Diagnosis not present

## 2017-05-05 DIAGNOSIS — H04123 Dry eye syndrome of bilateral lacrimal glands: Secondary | ICD-10-CM | POA: Diagnosis not present

## 2017-05-05 DIAGNOSIS — H5203 Hypermetropia, bilateral: Secondary | ICD-10-CM | POA: Diagnosis not present

## 2017-05-07 ENCOUNTER — Telehealth: Payer: Self-pay | Admitting: Hematology and Oncology

## 2017-05-07 NOTE — Telephone Encounter (Signed)
Left message re new appointments for 7/2 - moved from 6/19 due to PAL. Schedule mailed.

## 2017-05-13 ENCOUNTER — Ambulatory Visit: Payer: Medicare Other | Admitting: Hematology and Oncology

## 2017-05-20 DIAGNOSIS — L219 Seborrheic dermatitis, unspecified: Secondary | ICD-10-CM | POA: Diagnosis not present

## 2017-05-20 DIAGNOSIS — M542 Cervicalgia: Secondary | ICD-10-CM | POA: Diagnosis not present

## 2017-05-20 DIAGNOSIS — R21 Rash and other nonspecific skin eruption: Secondary | ICD-10-CM | POA: Diagnosis not present

## 2017-05-26 ENCOUNTER — Ambulatory Visit: Payer: Medicare Other | Admitting: Hematology and Oncology

## 2017-05-26 DIAGNOSIS — M542 Cervicalgia: Secondary | ICD-10-CM | POA: Diagnosis not present

## 2017-05-26 DIAGNOSIS — M47812 Spondylosis without myelopathy or radiculopathy, cervical region: Secondary | ICD-10-CM | POA: Diagnosis not present

## 2017-05-29 ENCOUNTER — Encounter: Payer: Self-pay | Admitting: Hematology and Oncology

## 2017-05-29 ENCOUNTER — Ambulatory Visit (HOSPITAL_BASED_OUTPATIENT_CLINIC_OR_DEPARTMENT_OTHER): Payer: Medicare Other | Admitting: Hematology and Oncology

## 2017-05-29 DIAGNOSIS — I890 Lymphedema, not elsewhere classified: Secondary | ICD-10-CM | POA: Diagnosis not present

## 2017-05-29 DIAGNOSIS — Z170 Estrogen receptor positive status [ER+]: Secondary | ICD-10-CM

## 2017-05-29 DIAGNOSIS — N951 Menopausal and female climacteric states: Secondary | ICD-10-CM

## 2017-05-29 DIAGNOSIS — C50412 Malignant neoplasm of upper-outer quadrant of left female breast: Secondary | ICD-10-CM | POA: Diagnosis not present

## 2017-05-29 DIAGNOSIS — I10 Essential (primary) hypertension: Secondary | ICD-10-CM | POA: Diagnosis not present

## 2017-05-29 MED ORDER — VALSARTAN-HYDROCHLOROTHIAZIDE 160-12.5 MG PO TABS: 1.0000 | ORAL_TABLET | Freq: Every day | ORAL | Status: DC

## 2017-05-29 MED ORDER — ANASTROZOLE 1 MG PO TABS: 1.0000 mg | ORAL_TABLET | Freq: Every day | ORAL | 11 refills | Status: DC

## 2017-05-29 NOTE — Progress Notes (Signed)
Patient Care Team: Stephens Shire, MD as PCP - General (Family Medicine)  DIAGNOSIS:  Encounter Diagnosis  Name Primary?  . 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 [name], T3N2 (5/10 LN positive) stage IIIa IDC grade 1, ER/PR 100%, TSH 14% HER-2 negative PET/CT 12/20/2013 negative for metastases      12/17/2013 - 04/02/2014 Chemotherapy    Taxotere Cytoxan every 3 weeks for 6 cycles      05/11/2014 - 06/27/2014 Radiation Therapy    Adjuvant radiation therapy to chest wall and lymph nodes      07/06/2014 -  Anti-estrogen oral therapy    Arimidex 1 mg daily       CHIEF COMPLIANT: Follow-up on Arimidex therapy  INTERVAL HISTORY: Wendy [name] is a 76 year old with above-mentioned history of left breast cancer treated with lumpectomy followed by adjuvant chemotherapy and radiation and is currently on Arimidex therapy. She is tolerating Arimidex fairly well except for occasional hot flashes. She has had them left arm lymphedema. Denies any other new lumps or nodules in the breast. Over the past several weeks she was having intense pain in the neck that would radiate down into the shoulder. She was seen by her primary care physician obtain x-rays of the neck and showed arthritis. The deep pain has resolved but she now has nagging aching sensation that area. Apparently she used ice at that  location and may have irritated the skin which led to a bruise.  REVIEW OF SYSTEMS:   Constitutional: Denies fevers, chills or abnormal weight loss Eyes: Denies blurriness of vision Ears, nose, mouth, throat, and face: Denies mucositis or sore throat Respiratory: Denies cough, dyspnea or wheezes Cardiovascular: Denies palpitation, chest discomfort Gastrointestinal:  Denies nausea, heartburn or change in bowel habits Skin: Denies abnormal skin rashes Lymphatics: Denies new lymphadenopathy or easy bruising Neurological:Denies numbness, tingling or new weaknesses, neck pain Behavioral/Psych: Mood is stable, no new changes  Extremities: No lower extremity edema Breast:  denies any pain or lumps or nodules in either breasts All other systems were reviewed with the patient and are negative.  I have reviewed the past medical history, past surgical history, social history and family history with the patient and they are unchanged from previous note.  ALLERGIES:  has No Known Allergies.  MEDICATIONS:  Current Outpatient Prescriptions  Medication Sig Dispense Refill  . anastrozole (ARIMIDEX) 1 MG tablet take 1 tablet by mouth once daily 30 tablet 11  . aspirin EC 81 MG tablet Take 81 mg by mouth daily.    [name] atorvastatin (LIPITOR) 40 MG tablet Take 20 mg by mouth at bedtime.    . Calcium Carb-Cholecalciferol (CALCIUM 600 + D PO) Take 1 tablet by mouth daily.    . Multiple Vitamin (MULTIVITAMIN WITH MINERALS) TABS tablet Take 1 tablet by mouth daily.    [name]  NITROSTAT 0.4 MG SL tablet     . omega-3 acid ethyl esters (LOVAZA) 1 G capsule Take 1 g by mouth daily.    . Potassium 99 MG TABS Take 1 tablet by mouth daily.    [name] UNABLE TO FIND Rx: L8000- Mastectomy Bra (Quantity: 2) Dx: 174.9; Left Mastectomy     No current facility-administered medications for this visit.     PHYSICAL EXAMINATION: ECOG PERFORMANCE STATUS: 1 - Symptomatic but completely ambulatory  Vitals:   05/29/17 0838  BP:  (!) 116/37  Pulse: 75  Resp: 18  Temp: 97.7 F (36.5 C)   Filed Weights   05/29/17 0838  Weight: 184 lb (83.5 kg)    GENERAL:alert, no distress and comfortable SKIN: skin color, texture, turgor are normal, no rashes or significant lesions EYES: normal, Conjunctiva are pink and non-injected, sclera clear OROPHARYNX:no exudate, no erythema and lips, buccal mucosa, and tongue normal  NECK: supple, thyroid normal size, non-tender, without nodularity LYMPH:  no palpable lymphadenopathy in the cervical, axillary or inguinal LUNGS: clear to auscultation and percussion with normal breathing effort HEART: regular rate & rhythm and no murmurs and no lower extremity edema ABDOMEN:abdomen soft, non-tender and normal bowel sounds MUSCULOSKELETAL:no cyanosis of digits and no clubbing  NEURO: alert & oriented x 3 with fluent speech, no focal motor/sensory deficits EXTREMITIES: No lower extremity edema BREAST: No palpable masses or nodules in either right or left breasts. No palpable axillary supraclavicular or infraclavicular adenopathy no breast tenderness or nipple discharge. (exam performed in the presence of a chaperone)  LABORATORY DATA:  I have reviewed the data as listed   Chemistry      Component Value Date/Time   NA 142 05/15/2015 1308   K 4.3 05/15/2015 1308   CL 102 12/10/2013 1150   CO2 28 05/15/2015 1308   BUN 16.7 05/15/2015 1308   CREATININE 0.9 05/15/2015 1308      Component Value Date/Time   CALCIUM 10.3 05/15/2015 1308   ALKPHOS 82 05/15/2015 1308   AST 36 (H) 05/15/2015 1308   ALT 48 05/15/2015 1308   BILITOT 0.66 05/15/2015 1308       Lab Results  Component Value Date   WBC 4.1 05/15/2015   HGB 13.9 05/15/2015   HCT 42.0 05/15/2015   MCV 90.9 05/15/2015   PLT 164 05/15/2015   NEUTROABS 2.5 05/15/2015    ASSESSMENT & PLAN:  Malignant neoplasm of upper-outer quadrant of left breast in female, estrogen receptor positive ([facility]) Left breast cancer T3, N2, M0  stage IIIa invasive ductal carcinoma ER 100% PR 100% gases on 40% HER-2 negative status post left mastectomy and adjuvant chemotherapy with Taxotere Cytoxan x6 cycles. She had radiation therapy and has been on antiestrogen therapy with Arimidex since 06/28/2014  Arimidex toxicities: Occasional hot flashes but otherwise no problems or symptoms.  Breast cancer surveillance: 1. Breast exam 05/29/2017  benign, left mastectomy scar and axilla were palpated left arm 2. Mammogram 11/22/2016: Benign 3. Bone density 2015: Normal bone density  Left arm swelling and redness started 08/06/2016: Ultrasound of the left arm did not show any DVT. I suspect this is related to lymphedema. Left shoulder pain: Bone scan 08/15/2016: Degenerative changes. Some uptake in the greater trochanters bilaterally asymmetrically greater on the right. Patient does not have any pain in that area and hence further imaging was not done. Neck pain: I discussed with her that if the pain does not improve then we will need an MRI of her neck.  Hypertension: Follows her PCP. Patient stays very busy working with hospice.  Return to clinic in 1 year for follow-up  I spent 25 minutes talking to the patient of which more than half was spent in counseling and coordination of care.  No orders of the defined types were placed in this encounter.  The patient has a good understanding of the overall plan. she agrees with it. she will call with any problems that may develop before the next visit here.   Rulon Eisenmenger, MD 05/29/17

## 2017-05-29 NOTE — Assessment & Plan Note (Signed)
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 She had radiation therapy and has been on antiestrogen therapy with Arimidex since 06/28/2014  Arimidex toxicities: Occasional hot flashes but otherwise no problems or symptoms.  Breast cancer surveillance: 1. Breast exam 05/29/2017  benign, left mastectomy scar and axilla were palpated left arm 2. Mammogram 11/22/2016: Benign 3. Bone density 2015: Normal bone density  Left arm swelling and redness started 08/06/2016: Ultrasound of the left arm did not show any DVT. I suspect this is related to lymphedema.  Left shoulder pain: Bone scan 08/15/2016: Degenerative changes. Some uptake in the greater trochanters bilaterally asymmetrically greater on the right. Patient does not have any pain in that area and hence further imaging was not done.  Hypertension: Blood pressure continues to be elevated. She will discuss with her PCP.  Patient stays very busy working with hospice.  Return to clinic in 1 year for follow-up

## 2017-06-01 ENCOUNTER — Emergency Department (HOSPITAL_COMMUNITY)
Admission: EM | Admit: 2017-06-01 | Discharge: 2017-06-01 | Disposition: A | Discharge status: Home / Self Care | Payer: Medicare Other | Attending: Emergency Medicine | Admitting: Emergency Medicine

## 2017-06-01 ENCOUNTER — Emergency Department (HOSPITAL_COMMUNITY): Payer: Medicare Other

## 2017-06-01 ENCOUNTER — Encounter (HOSPITAL_COMMUNITY): Payer: Self-pay | Admitting: *Deleted

## 2017-06-01 DIAGNOSIS — Z79811 Long term (current) use of aromatase inhibitors: Secondary | ICD-10-CM | POA: Insufficient documentation

## 2017-06-01 DIAGNOSIS — N189 Chronic kidney disease, unspecified: Secondary | ICD-10-CM | POA: Diagnosis not present

## 2017-06-01 DIAGNOSIS — Z79899 Other long term (current) drug therapy: Secondary | ICD-10-CM | POA: Diagnosis not present

## 2017-06-01 DIAGNOSIS — I129 Hypertensive chronic kidney disease with stage 1 through stage 4 chronic kidney disease, or unspecified chronic kidney disease: Secondary | ICD-10-CM | POA: Insufficient documentation

## 2017-06-01 DIAGNOSIS — R918 Other nonspecific abnormal finding of lung field: Secondary | ICD-10-CM | POA: Diagnosis not present

## 2017-06-01 DIAGNOSIS — I252 Old myocardial infarction: Secondary | ICD-10-CM | POA: Insufficient documentation

## 2017-06-01 DIAGNOSIS — Z87891 Personal history of nicotine dependence: Secondary | ICD-10-CM | POA: Insufficient documentation

## 2017-06-01 DIAGNOSIS — D696 Thrombocytopenia, unspecified: Secondary | ICD-10-CM | POA: Insufficient documentation

## 2017-06-01 DIAGNOSIS — Z7982 Long term (current) use of aspirin: Secondary | ICD-10-CM | POA: Insufficient documentation

## 2017-06-01 DIAGNOSIS — I2510 Atherosclerotic heart disease of native coronary artery without angina pectoris: Secondary | ICD-10-CM | POA: Insufficient documentation

## 2017-06-01 DIAGNOSIS — M542 Cervicalgia: Secondary | ICD-10-CM | POA: Diagnosis present

## 2017-06-01 DIAGNOSIS — N3000 Acute cystitis without hematuria: Secondary | ICD-10-CM

## 2017-06-01 DIAGNOSIS — N289 Disorder of kidney and ureter, unspecified: Secondary | ICD-10-CM

## 2017-06-01 DIAGNOSIS — R509 Fever, unspecified: Secondary | ICD-10-CM | POA: Diagnosis not present

## 2017-06-01 LAB — COMPREHENSIVE METABOLIC PANEL
ALT: 36 U/L (ref 14–54)
AST: 28 U/L (ref 15–41)
Albumin: 3.9 g/dL (ref 3.5–5.0)
Alkaline Phosphatase: 63 U/L (ref 38–126)
Anion gap: 7 (ref 5–15)
BUN: 38 mg/dL — ABNORMAL HIGH (ref 6–20)
CO2: 28 mmol/L (ref 22–32)
Calcium: 9.4 mg/dL (ref 8.9–10.3)
Chloride: 104 mmol/L (ref 101–111)
Creatinine, Ser: 1.26 mg/dL — ABNORMAL HIGH (ref 0.44–1.00)
GFR calc Af Amer: 47 mL/min — ABNORMAL LOW (ref >60)
GFR calc non Af Amer: 41 mL/min — ABNORMAL LOW (ref >60)
Glucose, Bld: 109 mg/dL — ABNORMAL HIGH (ref 65–99)
Potassium: 4 mmol/L (ref 3.5–5.1)
Sodium: 139 mmol/L (ref 135–145)
Total Bilirubin: 1.7 mg/dL — ABNORMAL HIGH (ref 0.3–1.2)
Total Protein: 6.9 g/dL (ref 6.5–8.1)

## 2017-06-01 LAB — CBC WITH DIFFERENTIAL/PLATELET
Basophils Absolute: 0 10*3/uL (ref 0.0–0.1)
Basophils Relative: 0 %
Eosinophils Absolute: 0 10*3/uL (ref 0.0–0.7)
Eosinophils Relative: 0 %
HCT: 40.6 % (ref 36.0–46.0)
Hemoglobin: 13.7 g/dL (ref 12.0–15.0)
Lymphocytes Relative: 3 %
Lymphs Abs: 0.4 10*3/uL — ABNORMAL LOW (ref 0.7–4.0)
MCH: 30.9 pg (ref 26.0–34.0)
MCHC: 33.7 g/dL (ref 30.0–36.0)
MCV: 91.6 fL (ref 78.0–100.0)
Monocytes Absolute: 0.9 10*3/uL (ref 0.1–1.0)
Monocytes Relative: 7 %
Neutro Abs: 12.6 10*3/uL — ABNORMAL HIGH (ref 1.7–7.7)
Neutrophils Relative %: 91 %
Platelets: 104 10*3/uL — ABNORMAL LOW (ref 150–400)
RBC: 4.43 MIL/uL (ref 3.87–5.11)
RDW: 15.3 % (ref 11.5–15.5)
WBC: 13.9 10*3/uL — ABNORMAL HIGH (ref 4.0–10.5)

## 2017-06-01 LAB — URINALYSIS, ROUTINE W REFLEX MICROSCOPIC
Bilirubin Urine: NEGATIVE
Glucose, UA: NEGATIVE mg/dL
Ketones, ur: NEGATIVE mg/dL
Nitrite: NEGATIVE
Protein, ur: 100 mg/dL — AB
Specific Gravity, Urine: 1.016 (ref 1.005–1.030)
pH: 5 (ref 5.0–8.0)

## 2017-06-01 MED ORDER — CEFTRIAXONE SODIUM 1 G IJ SOLR
1.0000 g | Freq: Once | INTRAMUSCULAR | Status: AC
  Administered 2017-06-01: 1 g via INTRAVENOUS
  Filled 2017-06-01: qty 10

## 2017-06-01 MED ORDER — CEPHALEXIN 250 MG PO CAPS
250.0000 mg | ORAL_CAPSULE | Freq: Four times a day (QID) | ORAL | 0 refills | Status: DC
Start: 2017-06-01 — End: 2017-09-23

## 2017-06-01 MED ORDER — SODIUM CHLORIDE 0.9 % IV BOLUS (SEPSIS)
500.0000 mL | Freq: Once | INTRAVENOUS | Status: AC
  Administered 2017-06-01: 500 mL via INTRAVENOUS

## 2017-06-01 NOTE — Discharge Instructions (Signed)
Take the antibiotics as prescribed for the urinary tract infection, follow-up with your doctor next week to make sure your symptoms are improving, return to the emergency room for fever vomiting worsening symptoms

## 2017-06-01 NOTE — ED Notes (Signed)
Pt is alert, conversant, Dr Raliegh Ip at bedside to discuss  Pt family at bedside

## 2017-06-01 NOTE — ED Provider Notes (Signed)
[facility] DEPT Provider Note   CSN: [id number] Arrival date & time: 06/01/17  9678     History   Chief Complaint Chief Complaint  Patient presents with  . Emesis    HPI Wendy [name] is a 76 y.o. female.  HPI Patient presents to the emergency room for evaluation of neck pain, aches, fevers and chills. Patient states her symptoms initially started a few weeks ago. She had the sudden onset of neck pain that was very sharp and severe. She felt like if she moved her head she might become paralyzed. She called her daughter who is a Marine scientist who evaluated her and reassured her. She ended up waiting to see her doctor the next morning. At the doctor's office she told she most likely had a pinched nerve. Patient has seen her doctor a few times since that time because her symptoms have been improving but not completely resolved. At one point she started developing some redness in her neck. She had some follow-up x-rays and blood tests showed no worrisome findings. The patient was last seen in the office on July 2.  Patient states since that time she's felt some low-grade fevers up to 100.1. She continues to have some mild aching in her neck. She also has a mild headache. She denies any vomiting although she said some intermittent nausea over the last few weeks. She denies any coughing. No trouble urinating. She denies any rashes. No recent travel. She did have a tick bite several weeks ago. Past Medical History:  Diagnosis Date  . Breast cancer    left breast  . CAD (coronary artery disease)   . Hyperlipidemia   . Hypertension   . Myocardial infarction ([place]) 11/22/04  . Peripheral vascular disease ([place])    atherosclerosis carotid artery-mild    Patient Active Problem List   Diagnosis Date Noted  . Obesity (BMI 30-39.9) 01/06/2014  . Hyperlipidemia 01/06/2014  . CAD (coronary artery disease)   . Essential hypertension   . Carotid artery disease ([place])   . Malignant neoplasm of  upper-outer quadrant of left breast in female, estrogen receptor positive ([place]) 10/29/2013    Past Surgical History:  Procedure Laterality Date  . ABDOMINAL HYSTERECTOMY  1982  . KNEE ARTHROSCOPY     knee  . MASTECTOMY MODIFIED RADICAL Left 11/12/2013   Procedure: MASTECTOMY MODIFIED RADICAL;  Surgeon: Rolm Bookbinder, MD;  Location: [facility];  Service: General;  Laterality: Left;  . 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 PERCUTANEOUS CORONARY STENT INTERVENTION (PCI-S)  2005  . PORTACATH PLACEMENT Right 12/16/2013   Procedure: INSERTION PORT-A-CATH;  Surgeon: Rolm Bookbinder, MD;  Location: [facility];  Service: General;  Laterality: Right;    OB History    No data available       Home Medications    Prior to Admission medications   Medication Sig Start Date End Date Taking? Authorizing Provider  anastrozole (ARIMIDEX) 1 MG tablet Take 1 tablet (1 mg total) by mouth daily. 05/29/17  Yes [name] Lose, MD  aspirin EC 81 MG tablet Take 81 mg by mouth daily.   Yes [provider]  atorvastatin (LIPITOR) 40 MG tablet Take 20 mg by mouth 2 (two) times daily.    Yes [provider]  Calcium Carb-Cholecalciferol (CALCIUM 600 + D PO) Take 1 tablet by mouth daily.   Yes [provider]  Multiple Vitamin (MULTIVITAMIN WITH MINERALS) TABS tablet Take 1 tablet by mouth daily.  Yes [provider]  NITROSTAT 0.4 MG SL tablet  01/06/14  Yes [provider]  omega-3 acid ethyl esters (LOVAZA) 1 G capsule Take 1 g by mouth daily.   Yes [provider]  valsartan-hydrochlorothiazide (DIOVAN HCT) 160-12.5 MG tablet Take 1 tablet by mouth daily. 1/2 tab twice daily 05/29/17  Yes [name] Lose, MD  cephALEXin (KEFLEX) 250 MG capsule Take 1 capsule (250 mg total) by mouth 4 (four) times daily. 06/01/17   Dorie Rank, MD  UNABLE TO FIND Rx: [id number]- Mastectomy Bra (Quantity: 2) Dx: 174.9; Left Mastectomy 11/23/13   Rolm Bookbinder, MD      Family History Family History  Problem Relation Age of Onset  . Emphysema Mother   . Heart attack Father     Social History Social History  Substance Use Topics  . Smoking status: Former Smoker    Packs/day: 0.30    Types: Cigarettes    Quit date: 11/09/1983  . Smokeless tobacco: Never Used  . Alcohol use Yes     Comment: socially     Allergies   Patient has no known allergies.   Review of Systems Review of Systems  All other systems reviewed and are negative.    Physical Exam Updated Vital Signs BP (!) 123/57 (BP Location: Right Arm)   Pulse (!) 107   Temp 98.5 F (36.9 C) (Oral)   Resp 18   Ht 1.727 m (5\' 8" )   Wt 83.5 kg (184 lb)   SpO2 98%   BMI 27.98 kg/m   Physical Exam  Constitutional: She appears well-developed and well-nourished. No distress.  HENT:  Head: Normocephalic and atraumatic.  Right Ear: External ear normal.  Left Ear: External ear normal.  Mouth/Throat: No oropharyngeal exudate.  Eyes: Conjunctivae are normal. Right eye exhibits no discharge. Left eye exhibits no discharge. No scleral icterus.  Neck: Normal range of motion. Neck supple. No tracheal deviation present.  Cardiovascular: Normal rate, regular rhythm and intact distal pulses.   Pulmonary/Chest: Effort normal and breath sounds normal. No stridor. No respiratory distress. She has no wheezes. She has no rales.  Abdominal: Soft. Bowel sounds are normal. She exhibits no distension. There is no tenderness. There is no rebound and no guarding.  Musculoskeletal: She exhibits no edema or tenderness.  Lymphadenopathy:    She has no cervical adenopathy.  Neurological: She is alert. She has normal strength. No cranial nerve deficit (no facial droop, extraocular movements intact, no slurred speech) or sensory deficit. She exhibits normal muscle tone. She displays no seizure activity. Coordination normal.  Skin: Skin is warm and dry. No rash noted.  Psychiatric: She has a normal mood  and affect.  Nursing note and vitals reviewed.    ED Treatments / Results  Labs (all labs ordered are listed, but only abnormal results are displayed) Labs Reviewed  CBC WITH DIFFERENTIAL/PLATELET - Abnormal; Notable for the following:       Result Value   WBC 13.9 (*)    Platelets 104 (*)    Neutro Abs 12.6 (*)    Lymphs Abs 0.4 (*)    All other components within normal limits  COMPREHENSIVE METABOLIC PANEL - Abnormal; Notable for the following:    Glucose, Bld 109 (*)    BUN 38 (*)    Creatinine, Ser 1.26 (*)    Total Bilirubin 1.7 (*)    GFR calc non Af Amer 41 (*)    GFR calc Af Amer 47 (*)    All  other components within normal limits  URINALYSIS, ROUTINE W REFLEX MICROSCOPIC - Abnormal; Notable for the following:    APPearance CLOUDY (*)    Hgb urine dipstick SMALL (*)    Protein, ur 100 (*)    Leukocytes, UA LARGE (*)    Bacteria, UA RARE (*)    Squamous Epithelial / LPF 0-5 (*)    All other components within normal limits  URINE CULTURE     Radiology Dg Chest 2 View  Result Date: 06/01/2017 CLINICAL DATA:  Patient with fever and body aches for 3 weeks. EXAM: CHEST  2 VIEW COMPARISON:  Chest radiograph 12/16/2013 FINDINGS: Stable cardiac and mediastinal contours. Mild heterogeneous opacities right lung base. No pleural effusion or pneumothorax. Thoracic spine degenerative changes. IMPRESSION: Heterogeneous opacities right lung base may be secondary to overlapping soft tissue structures. Underlying infectious process not excluded. Electronically Signed   By: Lovey Newcomer M.D.   On: 06/01/2017 09:51    Procedures Procedures (including critical care time)  Medications Ordered in ED Medications  cefTRIAXone (ROCEPHIN) 1 g in dextrose 5 % 50 mL IVPB (not administered)  sodium chloride 0.9 % bolus 500 mL (not administered)     Initial Impression / Assessment and Plan / ED Course  I have reviewed the triage vital signs and the nursing notes.  Pertinent labs &  imaging results that were available during my care of the patient were reviewed by me and considered in my medical decision making (see chart for details).   patient's laboratory tests do show a urinary tract infection.  I suspect this is the cause of her fever and generalized malaise. She is not having any significant neck pain now. I doubt this is related to her infectious symptoms. She was previously told it could be radicular neck pain .   Plan on discharge home with antibiotics. Discussed follow-up with her primary doctor. Renal insufficiency and mild thrombocytopenia noted. This is new compared to laboratory test 2 years ago.  I doubt this is clinically significant I recommend she follow up with her primary care doctor to have this rechecked.  Final Clinical Impressions(s) / ED Diagnoses   Final diagnoses:  Acute cystitis without hematuria  Renal insufficiency  Thrombocytopenia (HCC)    New Prescriptions New Prescriptions   CEPHALEXIN (KEFLEX) 250 MG CAPSULE    Take 1 capsule (250 mg total) by mouth 4 (four) times daily.     Dorie Rank, MD 06/01/17 1049

## 2017-06-01 NOTE — ED Triage Notes (Signed)
Pt states she began having n/v on Thursday accompanied by fevers. This is her primary reason for coming in.   In addition to this, pt adds that she was recently being treated for right neck pain that she was concerned about. States those symptoms have resolved.

## 2017-06-04 DIAGNOSIS — D696 Thrombocytopenia, unspecified: Secondary | ICD-10-CM | POA: Diagnosis not present

## 2017-06-04 DIAGNOSIS — N390 Urinary tract infection, site not specified: Secondary | ICD-10-CM | POA: Diagnosis not present

## 2017-06-04 LAB — URINE CULTURE: Culture: >=100000 — AB

## 2017-06-05 ENCOUNTER — Telehealth: Payer: Self-pay | Admitting: Emergency Medicine

## 2017-06-05 NOTE — Telephone Encounter (Signed)
Post ED Visit - Positive Culture Follow-up  Culture report reviewed by antimicrobial stewardship pharmacist:  []  Elenor Quinones, Pharm.D. []  Heide Guile, Pharm.D., BCPS AQ-ID []  Parks Neptune, Pharm.D., BCPS [x]  Alycia Rossetti, Pharm.D., BCPS []  [place], Pharm.D., BCPS, AAHIVP []  Legrand Como, Pharm.D., BCPS, AAHIVP []  Salome Arnt, PharmD, BCPS []  Dimitri Ped, PharmD, BCPS []  Vincenza Hews, PharmD, BCPS  Positive urine culture Treated with cephalexin, organism sensitive to the same and no further patient follow-up is required at this time.  Hazle Nordmann 06/05/2017, 11:31 AM

## 2017-06-11 DIAGNOSIS — R399 Unspecified symptoms and signs involving the genitourinary system: Secondary | ICD-10-CM | POA: Diagnosis not present

## 2017-09-02 DIAGNOSIS — Z23 Encounter for immunization: Secondary | ICD-10-CM | POA: Diagnosis not present

## 2017-09-15 DIAGNOSIS — J22 Unspecified acute lower respiratory infection: Secondary | ICD-10-CM | POA: Diagnosis not present

## 2017-09-22 ENCOUNTER — Emergency Department (HOSPITAL_COMMUNITY): Payer: Medicare Other

## 2017-09-22 ENCOUNTER — Inpatient Hospital Stay (HOSPITAL_COMMUNITY)
Admission: EM | Admit: 2017-09-22 | Discharge: 2017-09-26 | DRG: 872 | Disposition: A | Discharge status: Home / Self Care | Payer: Medicare Other | Attending: Family Medicine | Admitting: Family Medicine

## 2017-09-22 ENCOUNTER — Encounter (HOSPITAL_COMMUNITY): Payer: Self-pay | Admitting: Emergency Medicine

## 2017-09-22 DIAGNOSIS — I252 Old myocardial infarction: Secondary | ICD-10-CM

## 2017-09-22 DIAGNOSIS — Z9012 Acquired absence of left breast and nipple: Secondary | ICD-10-CM | POA: Diagnosis not present

## 2017-09-22 DIAGNOSIS — R8271 Bacteriuria: Secondary | ICD-10-CM | POA: Diagnosis not present

## 2017-09-22 DIAGNOSIS — Z79899 Other long term (current) drug therapy: Secondary | ICD-10-CM | POA: Diagnosis not present

## 2017-09-22 DIAGNOSIS — R110 Nausea: Secondary | ICD-10-CM | POA: Diagnosis not present

## 2017-09-22 DIAGNOSIS — A419 Sepsis, unspecified organism: Secondary | ICD-10-CM | POA: Diagnosis not present

## 2017-09-22 DIAGNOSIS — B9620 Unspecified Escherichia coli [E. coli] as the cause of diseases classified elsewhere: Secondary | ICD-10-CM | POA: Diagnosis present

## 2017-09-22 DIAGNOSIS — L539 Erythematous condition, unspecified: Secondary | ICD-10-CM | POA: Diagnosis not present

## 2017-09-22 DIAGNOSIS — N183 Chronic kidney disease, stage 3 unspecified: Secondary | ICD-10-CM | POA: Diagnosis present

## 2017-09-22 DIAGNOSIS — Z66 Do not resuscitate: Secondary | ICD-10-CM | POA: Diagnosis present

## 2017-09-22 DIAGNOSIS — Z170 Estrogen receptor positive status [ER+]: Secondary | ICD-10-CM

## 2017-09-22 DIAGNOSIS — R0789 Other chest pain: Secondary | ICD-10-CM | POA: Diagnosis present

## 2017-09-22 DIAGNOSIS — B955 Unspecified streptococcus as the cause of diseases classified elsewhere: Secondary | ICD-10-CM | POA: Diagnosis not present

## 2017-09-22 DIAGNOSIS — Z87891 Personal history of nicotine dependence: Secondary | ICD-10-CM | POA: Diagnosis not present

## 2017-09-22 DIAGNOSIS — R072 Precordial pain: Secondary | ICD-10-CM | POA: Diagnosis not present

## 2017-09-22 DIAGNOSIS — Z6830 Body mass index (BMI) 30.0-30.9, adult: Secondary | ICD-10-CM | POA: Diagnosis not present

## 2017-09-22 DIAGNOSIS — N189 Chronic kidney disease, unspecified: Secondary | ICD-10-CM | POA: Diagnosis not present

## 2017-09-22 DIAGNOSIS — I2510 Atherosclerotic heart disease of native coronary artery without angina pectoris: Secondary | ICD-10-CM | POA: Diagnosis present

## 2017-09-22 DIAGNOSIS — I890 Lymphedema, not elsewhere classified: Secondary | ICD-10-CM | POA: Diagnosis present

## 2017-09-22 DIAGNOSIS — I739 Peripheral vascular disease, unspecified: Secondary | ICD-10-CM | POA: Diagnosis present

## 2017-09-22 DIAGNOSIS — M7989 Other specified soft tissue disorders: Secondary | ICD-10-CM | POA: Diagnosis not present

## 2017-09-22 DIAGNOSIS — L03114 Cellulitis of left upper limb: Secondary | ICD-10-CM | POA: Diagnosis not present

## 2017-09-22 DIAGNOSIS — C50412 Malignant neoplasm of upper-outer quadrant of left female breast: Secondary | ICD-10-CM | POA: Diagnosis present

## 2017-09-22 DIAGNOSIS — R011 Cardiac murmur, unspecified: Secondary | ICD-10-CM | POA: Diagnosis not present

## 2017-09-22 DIAGNOSIS — E669 Obesity, unspecified: Secondary | ICD-10-CM | POA: Diagnosis present

## 2017-09-22 DIAGNOSIS — Z905 Acquired absence of kidney: Secondary | ICD-10-CM | POA: Diagnosis not present

## 2017-09-22 DIAGNOSIS — I129 Hypertensive chronic kidney disease with stage 1 through stage 4 chronic kidney disease, or unspecified chronic kidney disease: Secondary | ICD-10-CM | POA: Diagnosis present

## 2017-09-22 DIAGNOSIS — I25118 Atherosclerotic heart disease of native coronary artery with other forms of angina pectoris: Secondary | ICD-10-CM | POA: Diagnosis not present

## 2017-09-22 DIAGNOSIS — Z955 Presence of coronary angioplasty implant and graft: Secondary | ICD-10-CM | POA: Diagnosis not present

## 2017-09-22 DIAGNOSIS — R7881 Bacteremia: Secondary | ICD-10-CM

## 2017-09-22 DIAGNOSIS — R079 Chest pain, unspecified: Secondary | ICD-10-CM | POA: Diagnosis present

## 2017-09-22 DIAGNOSIS — Z9861 Coronary angioplasty status: Secondary | ICD-10-CM

## 2017-09-22 DIAGNOSIS — I10 Essential (primary) hypertension: Secondary | ICD-10-CM

## 2017-09-22 DIAGNOSIS — N390 Urinary tract infection, site not specified: Secondary | ICD-10-CM | POA: Diagnosis present

## 2017-09-22 DIAGNOSIS — Z7982 Long term (current) use of aspirin: Secondary | ICD-10-CM

## 2017-09-22 DIAGNOSIS — R2232 Localized swelling, mass and lump, left upper limb: Secondary | ICD-10-CM | POA: Diagnosis not present

## 2017-09-22 DIAGNOSIS — E785 Hyperlipidemia, unspecified: Secondary | ICD-10-CM | POA: Diagnosis present

## 2017-09-22 DIAGNOSIS — A46 Erysipelas: Secondary | ICD-10-CM | POA: Diagnosis not present

## 2017-09-22 DIAGNOSIS — Z853 Personal history of malignant neoplasm of breast: Secondary | ICD-10-CM | POA: Diagnosis not present

## 2017-09-22 DIAGNOSIS — I340 Nonrheumatic mitral (valve) insufficiency: Secondary | ICD-10-CM | POA: Diagnosis not present

## 2017-09-22 LAB — CBC WITH DIFFERENTIAL/PLATELET
Basophils Absolute: 0 10*3/uL (ref 0.0–0.1)
Basophils Relative: 0 %
Eosinophils Absolute: 0.1 10*3/uL (ref 0.0–0.7)
Eosinophils Relative: 1 %
HCT: 37.7 % (ref 36.0–46.0)
Hemoglobin: 12.5 g/dL (ref 12.0–15.0)
Lymphocytes Relative: 6 %
Lymphs Abs: 0.6 10*3/uL — ABNORMAL LOW (ref 0.7–4.0)
MCH: 30.6 pg (ref 26.0–34.0)
MCHC: 33.2 g/dL (ref 30.0–36.0)
MCV: 92.2 fL (ref 78.0–100.0)
Monocytes Absolute: 0.4 10*3/uL (ref 0.1–1.0)
Monocytes Relative: 4 %
Neutro Abs: 9 10*3/uL — ABNORMAL HIGH (ref 1.7–7.7)
Neutrophils Relative %: 89 %
Platelets: 185 10*3/uL (ref 150–400)
RBC: 4.09 MIL/uL (ref 3.87–5.11)
RDW: 13.9 % (ref 11.5–15.5)
WBC: 10.1 10*3/uL (ref 4.0–10.5)

## 2017-09-22 LAB — COMPREHENSIVE METABOLIC PANEL
ALT: 31 U/L (ref 14–54)
AST: 37 U/L (ref 15–41)
Albumin: 3.9 g/dL (ref 3.5–5.0)
Alkaline Phosphatase: 66 U/L (ref 38–126)
Anion gap: 12 (ref 5–15)
BUN: 22 mg/dL — ABNORMAL HIGH (ref 6–20)
CO2: 22 mmol/L (ref 22–32)
Calcium: 9.2 mg/dL (ref 8.9–10.3)
Chloride: 103 mmol/L (ref 101–111)
Creatinine, Ser: 1.25 mg/dL — ABNORMAL HIGH (ref 0.44–1.00)
GFR calc Af Amer: 47 mL/min — ABNORMAL LOW (ref >60)
GFR calc non Af Amer: 41 mL/min — ABNORMAL LOW (ref >60)
Glucose, Bld: 124 mg/dL — ABNORMAL HIGH (ref 65–99)
Potassium: 3.7 mmol/L (ref 3.5–5.1)
Sodium: 137 mmol/L (ref 135–145)
Total Bilirubin: 0.6 mg/dL (ref 0.3–1.2)
Total Protein: 6.6 g/dL (ref 6.5–8.1)

## 2017-09-22 LAB — PROTIME-INR
INR: 1.01
Prothrombin Time: 13.2 seconds (ref 11.4–15.2)

## 2017-09-22 LAB — URINALYSIS, ROUTINE W REFLEX MICROSCOPIC
Bilirubin Urine: NEGATIVE
Glucose, UA: NEGATIVE mg/dL
Hgb urine dipstick: NEGATIVE
Ketones, ur: NEGATIVE mg/dL
Nitrite: POSITIVE — AB
Protein, ur: NEGATIVE mg/dL
Specific Gravity, Urine: 1.017 (ref 1.005–1.030)
Squamous Epithelial / LPF: NONE SEEN
pH: 5 (ref 5.0–8.0)

## 2017-09-22 LAB — I-STAT CG4 LACTIC ACID, ED: Lactic Acid, Venous: 2.72 mmol/L (ref 0.5–1.9)

## 2017-09-22 LAB — I-STAT TROPONIN, ED: Troponin i, poc: 0.01 ng/mL (ref 0.00–0.08)

## 2017-09-22 MED ORDER — SODIUM CHLORIDE 0.9 % IV BOLUS (SEPSIS)
1000.0000 mL | Freq: Once | INTRAVENOUS | Status: AC
  Administered 2017-09-22: 1000 mL via INTRAVENOUS

## 2017-09-22 MED ORDER — CEFAZOLIN SODIUM-DEXTROSE 1-4 GM/50ML-% IV SOLN
1.0000 g | Freq: Once | INTRAVENOUS | Status: AC
  Administered 2017-09-22: 1 g via INTRAVENOUS
  Filled 2017-09-22: qty 50

## 2017-09-22 MED ORDER — SODIUM CHLORIDE 0.9 % IV BOLUS (SEPSIS)
500.0000 mL | Freq: Once | INTRAVENOUS | Status: AC
  Administered 2017-09-22: 500 mL via INTRAVENOUS

## 2017-09-22 NOTE — H&P (Signed)
History and Physical    Wendy [name] OPF:[id number] DOB: [birth year] DOA: 09/22/2017  Referring MD/NP/PA:   PCP: [facility]   Patient coming from:  The patient is coming from home.  At baseline, pt is independent for most of ADL.  Chief Complaint: fever, left arm erythema and swelling, urinary urgency, chest pain  HPI: Wendy [name] is a 76 y.o. female with medical history significant of hypertension, hyperlipidemia, CAD, stent placement, PVD, CKD-3, left breast cancer (s/p of mastectomy), who presents with fever, left arm erythema and swelling, urinary urgency, chest pain.  Pt states that she started having chest pain at about 5:00 PM. It was located in the left side of chest, 5 out of 10 in safety, sharp, radiating to the left arm, left shoulder and left neck. She has dry cough, but no SOB. The chest pain is not pleuritic, not aggravated by deep breath. She states that she had URI and complicated a course of Z-Pak recently. Pt states that her chest pain has resolved currently. She was given one dose of aspirin 325 mg.  Patient states that she noted her left arm is swelling and erythematous today. She states that her left arm has been intermittently swelling after she had left mastectomy for breast cancer, but today it is worse. She has fever, chills, but no significant pain in left arm. She states that she has urinary urgency, but no dysuria or burning on urination. She has nausea and vomited once, denies abdominal pain or diarrhea. No unilateral weakness.  ED Course: pt was found to have WBC 10.1, lactic acid 2.72, positive urinalysis with small amount of leukocytes and positive nitrite, INR 1.01, stable renal function, temperature 100.3, tachycardia, tachypnea, O2 sat 97% on room air, negative chest x-ray. Patient is admitted to telemetry bed as inpatient.  Review of Systems:   General: has fevers, chills, no body weight gain, has poor appetite, has  fatigue HEENT: no blurry vision, hearing changes or sore throat Respiratory: no dyspnea, has mild coughing, no wheezing CV: has chest pain, no palpitations GI: had nausea, vomiting, no abdominal pain, diarrhea, constipation GU: no dysuria, burning on urination, increased urinary frequency, hematuria. Has urgency. Ext: no leg edema Neuro: no unilateral weakness, numbness, or tingling, no vision change or hearing loss Skin: has left arm swelling and erythema. MSK: No muscle spasm, no deformity, no limitation of range of movement in spin Heme: No easy bruising.  Travel history: No recent long distant travel.  Allergy: No Known Allergies  Past Medical History:  Diagnosis Date  . Breast cancer    left breast  . CAD (coronary artery disease)   . Hyperlipidemia   . Hypertension   . Myocardial infarction ([place]) 11/22/04  . Peripheral vascular disease ([place])    atherosclerosis carotid artery-mild    Past Surgical History:  Procedure Laterality Date  . ABDOMINAL HYSTERECTOMY  1982  . KNEE ARTHROSCOPY     knee  . MASTECTOMY MODIFIED RADICAL Left 11/12/2013   Procedure: MASTECTOMY MODIFIED RADICAL;  Surgeon: Rolm Bookbinder, MD;  Location: [facility];  Service: General;  Laterality: Left;  . 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 PERCUTANEOUS CORONARY STENT INTERVENTION (PCI-S)  2005  . PORTACATH PLACEMENT Right 12/16/2013   Procedure: INSERTION PORT-A-CATH;  Surgeon: Rolm Bookbinder, MD;  Location: [facility];  Service: General;  Laterality: Right;    Social History:  reports that she quit smoking about 33 years ago. Her smoking use  included Cigarettes. She smoked 0.30 packs per day. She has never used smokeless tobacco. She reports that she drinks alcohol. She reports that she does not use drugs.  Family History:  Family History  Problem Relation Age of Onset  . Emphysema Mother   . Heart attack Father      Prior to Admission medications   Medication Sig Start  Date End Date Taking? Authorizing Provider  anastrozole (ARIMIDEX) 1 MG tablet Take 1 tablet (1 mg total) by mouth daily. 05/29/17   [name] Lose, MD  aspirin EC 81 MG tablet Take 81 mg by mouth daily.    [provider]  atorvastatin (LIPITOR) 40 MG tablet Take 20 mg by mouth 2 (two) times daily.     [provider]  Calcium Carb-Cholecalciferol (CALCIUM 600 + D PO) Take 1 tablet by mouth daily.    [provider]  cephALEXin (KEFLEX) 250 MG capsule Take 1 capsule (250 mg total) by mouth 4 (four) times daily. 06/01/17   Dorie Rank, MD  Multiple Vitamin (MULTIVITAMIN WITH MINERALS) TABS tablet Take 1 tablet by mouth daily.    [provider]  NITROSTAT 0.4 MG SL tablet  01/06/14   [provider]  omega-3 acid ethyl esters (LOVAZA) 1 G capsule Take 1 g by mouth daily.    [provider]  UNABLE TO FIND Rx: [id number]- Mastectomy Bra (Quantity: 2) Dx: 174.9; Left Mastectomy 11/23/13   Rolm Bookbinder, MD  valsartan-hydrochlorothiazide (DIOVAN HCT) 160-12.5 MG tablet Take 1 tablet by mouth daily. 1/2 tab twice daily 05/29/17   [name] Lose, MD    Physical Exam: Vitals:   09/22/17 2230 09/22/17 2300 09/22/17 2330 09/23/17 0000  BP: (!) 115/55 (!) 123/54 114/60 (!) 100/46  Pulse: 96 95 94 90  Resp: (!) 21 (!) 25 (!) 23 15  Temp:      TempSrc:      SpO2: 99% 98% 98% 96%   General: Not in acute distress HEENT:       Eyes: PERRL, EOMI, no scleral icterus.       ENT: No discharge from the ears and nose, no pharynx injection, no tonsillar enlargement.        Neck: No JVD, no bruit, no mass felt. Heme: No neck lymph node enlargement. Cardiac: S1/S2, RRR, No murmurs, No gallops or rubs. Respiratory: No rales, wheezing, rhonchi or rubs. GI: Soft, nondistended, nontender, no rebound pain, no organomegaly, BS present. GU: No hematuria Ext: No pitting leg edema bilaterally. 2+DP/PT pulse bilaterally. Musculoskeletal: No joint deformities, No joint  redness or warmth, no limitation of ROM in spin. Skin: left arm is erythematous, swelling and warmth Neuro: Alert, oriented X3, cranial nerves II-XII grossly intact, moves all extremities normally. Psych: Patient is not psychotic, no suicidal or hemocidal ideation.  Labs on Admission: I have personally reviewed following labs and imaging studies  CBC:  Recent Labs Lab 09/22/17 2003  WBC 10.1  NEUTROABS 9.0*  HGB 12.5  HCT 37.7  MCV 92.2  PLT 562   Basic Metabolic Panel:  Recent Labs Lab 09/22/17 2003  NA 137  K 3.7  CL 103  CO2 22  GLUCOSE 124*  BUN 22*  CREATININE 1.25*  CALCIUM 9.2   GFR: CrCl cannot be calculated (Unknown ideal weight.). Liver Function Tests:  Recent Labs Lab 09/22/17 2003  AST 37  ALT 31  ALKPHOS 66  BILITOT 0.6  PROT 6.6  ALBUMIN 3.9   No results for input(s): LIPASE, AMYLASE in the last  168 hours. No results for input(s): AMMONIA in the last 168 hours. Coagulation Profile:  Recent Labs Lab 09/22/17 2003  INR 1.01   Cardiac Enzymes:  Recent Labs Lab 09/23/17 0046  TROPONINI 0.04*   BNP (last 3 results) No results for input(s): PROBNP in the last 8760 hours. HbA1C:  Recent Labs  09/23/17 0337  HGBA1C 5.5   CBG: No results for input(s): GLUCAP in the last 168 hours. Lipid Profile: No results for input(s): CHOL, HDL, LDLCALC, TRIG, CHOLHDL, LDLDIRECT in the last 72 hours. Thyroid Function Tests: No results for input(s): TSH, T4TOTAL, FREET4, T3FREE, THYROIDAB in the last 72 hours. Anemia Panel: No results for input(s): VITAMINB12, FOLATE, FERRITIN, TIBC, IRON, RETICCTPCT in the last 72 hours. Urine analysis:    Component Value Date/Time   COLORURINE YELLOW 09/22/2017 2243   APPEARANCEUR CLEAR 09/22/2017 2243   LABSPEC 1.017 09/22/2017 2243   PHURINE 5.0 09/22/2017 2243   GLUCOSEU NEGATIVE 09/22/2017 2243   HGBUR NEGATIVE 09/22/2017 2243   BILIRUBINUR NEGATIVE 09/22/2017 2243   KETONESUR NEGATIVE 09/22/2017  2243   PROTEINUR NEGATIVE 09/22/2017 2243   NITRITE POSITIVE (A) 09/22/2017 2243   LEUKOCYTESUR SMALL (A) 09/22/2017 2243   Sepsis Labs: @LABRCNTIP (procalcitonin:4,lacticidven:4) )No results found for this or any previous visit (from the past 240 hour(s)).   Radiological Exams on Admission: Dg Chest 2 View  Result Date: 09/22/2017 CLINICAL DATA:  76 year old female with chest pain. EXAM: CHEST  2 VIEW COMPARISON:  Chest radiograph dated 06/01/2017 FINDINGS: Minimal left lung base atelectatic changes. There is no focal consolidation, pleural effusion, or pneumothorax. The cardiac silhouette is within normal limits. There is mild atherosclerotic calcification of the aortic arch. Surgical clips noted in the left axilla. No acute osseous pathology. IMPRESSION: No active cardiopulmonary disease. Electronically Signed   By: Anner Crete M.D.   On: 09/22/2017 21:11     EKG: Independently reviewed.  Sinus rhythm, QTC 439, tachycardia, no ischemic change.   Assessment/Plan Principal Problem:   Sepsis ([place]) Active Problems:   Malignant neoplasm of upper-outer quadrant of left breast in female, estrogen receptor positive (HCC)   CAD (coronary artery disease)   Essential hypertension   Obesity (BMI 30-39.9)   Hyperlipidemia   CKD (chronic kidney disease), stage III ([place])   Lower urinary tract infectious disease   Chest pain   Left arm cellulitis   Sepsis due to left arm cellulitis and possible UTI: Patient meets criteria for sepsis with fever, tachycardia, tachypnea. Lactic acid is elevated at 2.72-->2.3. Currently hemodynamically stable.  - will admit to tele bed as inpt - Abx: pt received one dose of Ancef, will switch to Rocephin IV - PRN Zofran for nausea, morphine and Percocet for pain - Blood cultures x 2 and Ux - ESR and CRP - will get Procalcitonin and trend lactic acid levels per sepsis protocol. - IVF: 3.0 L of NS bolus in ED, followed by 125 cc/h - F/U Left UE doppler  to r/o DVT  CAD and chest pain:  S/p of stent. Her chest pain has resolved currently. Troponin 0.04. May be due to demand ischemia secondary to sepsis. - cycle CE q6 x3 and repeat EKG in the am  - prn Nitroglycerin, Morphine, and aspirin, lipitor  - Risk factor stratification: will check FLP and A1C  - 2d echo  Malignant neoplasm of upper-outer quadrant of left breast in female, estrogen receptor positive ([place]): s/p of L mastectomy -Continue anastrozole  HLD: -Lipitor  Essential hypertension: -hold home Diovan-HCTZ since patient  is at risk of developing hypotension due to sepsis -IV hydralazine when necessary  CKD (chronic kidney disease), stage III ([place]): Stable. Baseline creatinine 1.26, her creatinine is 1.025, BUN 22 on admission. -Follow-up renal function by BMP  Lower urinary tract infectious disease: -on rocephin -f/u Ux   DVT ppx: SQ Lovenox Code Status: DNR (I discussed with pt, and explained the meaning of CODE STATUS. Patient wants to be DNR) Family Communication: None at bed side.  Disposition Plan:  Anticipate discharge back to previous home environment Consults called: none  Admission status: inpatient/tele    Date of Service 09/23/2017    Ivor Costa [facility] Pager [phone]  If 7PM-7AM, please contact night-coverage [email or web address] Password [email or web address] 09/23/2017, 4:36 AM

## 2017-09-22 NOTE — ED Triage Notes (Signed)
Per [facility], Pt from home with onset of Nausea and chest pain at 4 pm this afternoon. Pt reports chest pain that radiates to her L arm, L shoulder and side of neck. Pt had 1 episode of vomiting en route. Pt received 324 ASA and 1 nitro en route. Pt took 1000mg  of tylenol at home at around 5:30 pm today. Pt's rectal temp is 103.0. Nitro decreased pain. Pt alert and oriented x4.

## 2017-09-22 NOTE — ED Provider Notes (Signed)
[facility] EMERGENCY DEPARTMENT Provider Note   CSN: [id number] Arrival date & time: 09/22/17  1943     History   Chief Complaint Chief Complaint  Patient presents with  . Chest Pain  . Nausea    HPI Wendy [name] is a 76 y.o. female.  The history is provided by the patient.   76 year old female who presents with chest pain and vomiting. She has a history of hypertension, hyperlipidemia, CAD and peripheral vascular disease. Reports that one week ago she was treated for upper respiratory infection with Z-Pak and Mucinex, and her symptoms improved. Today states that she had a decreased appetite since this morning, around 4:00 develop chills and sweats. States that she then developed left-sided chest pain that radiated to the jaw and the back. It was associated with one episode of vomiting, which subsequently improved her pain. Now currently denies any chest pain. Denies abdominal pain, diarrhea, cough, shortness of breath, dysuria or urinary frequency. No known sick contacts. EMS called, and she received nitroglycerin and aspirin. She had a rectal temperature of 103 Fahrenheit and received 1 g of Tylenol.   Past Medical History:  Diagnosis Date  . Breast cancer    left breast  . CAD (coronary artery disease)   . Hyperlipidemia   . Hypertension   . Myocardial infarction ([place]) 11/22/04  . Peripheral vascular disease ([place])    atherosclerosis carotid artery-mild    Patient Active Problem List   Diagnosis Date Noted  . CKD (chronic kidney disease), stage III ([place]) 09/22/2017  . Sepsis ([place]) 09/22/2017  . UTI (urinary tract infection) 09/22/2017  . Chest pain 09/22/2017  . Left arm cellulitis 09/22/2017  . Obesity (BMI 30-39.9) 01/06/2014  . Hyperlipidemia 01/06/2014  . CAD (coronary artery disease)   . Essential hypertension   . Carotid artery disease ([place])   . Malignant neoplasm of upper-outer quadrant of left breast in female, estrogen receptor  positive ([place]) 10/29/2013    Past Surgical History:  Procedure Laterality Date  . ABDOMINAL HYSTERECTOMY  1982  . KNEE ARTHROSCOPY     knee  . MASTECTOMY MODIFIED RADICAL Left 11/12/2013   Procedure: MASTECTOMY MODIFIED RADICAL;  Surgeon: Rolm Bookbinder, MD;  Location: [facility];  Service: General;  Laterality: Left;  . 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 PERCUTANEOUS CORONARY STENT INTERVENTION (PCI-S)  2005  . PORTACATH PLACEMENT Right 12/16/2013   Procedure: INSERTION PORT-A-CATH;  Surgeon: Rolm Bookbinder, MD;  Location: [facility];  Service: General;  Laterality: Right;    OB History    No data available       Home Medications    Prior to Admission medications   Medication Sig Start Date End Date Taking? Authorizing Provider  anastrozole (ARIMIDEX) 1 MG tablet Take 1 tablet (1 mg total) by mouth daily. 05/29/17   [name] Lose, MD  aspirin EC 81 MG tablet Take 81 mg by mouth daily.    [provider]  atorvastatin (LIPITOR) 40 MG tablet Take 20 mg by mouth 2 (two) times daily.     [provider]  Calcium Carb-Cholecalciferol (CALCIUM 600 + D PO) Take 1 tablet by mouth daily.    [provider]  cephALEXin (KEFLEX) 250 MG capsule Take 1 capsule (250 mg total) by mouth 4 (four) times daily. 06/01/17   Dorie Rank, MD  Multiple Vitamin (MULTIVITAMIN WITH MINERALS) TABS tablet Take 1 tablet by mouth daily.    [provider]  Delilah Shan  0.4 MG SL tablet  01/06/14   [provider]  omega-3 acid ethyl esters (LOVAZA) 1 G capsule Take 1 g by mouth daily.    [provider]  UNABLE TO FIND Rx: W0981- Mastectomy Bra (Quantity: 2) Dx: 174.9; Left Mastectomy 11/23/13   Rolm Bookbinder, MD  valsartan-hydrochlorothiazide (DIOVAN HCT) 160-12.5 MG tablet Take 1 tablet by mouth daily. 1/2 tab twice daily 05/29/17   [name] Lose, MD    Family History Family History  Problem Relation Age of Onset  . Emphysema Mother    . Heart attack Father     Social History Social History  Substance Use Topics  . Smoking status: Former Smoker    Packs/day: 0.30    Types: Cigarettes    Quit date: 11/09/1983  . Smokeless tobacco: Never Used  . Alcohol use Yes     Comment: socially     Allergies   Patient has no known allergies.   Review of Systems Review of Systems  Constitutional: Negative for fever.  Respiratory: Negative for shortness of breath.   Cardiovascular: Positive for chest pain. Negative for leg swelling.  Gastrointestinal: Negative for abdominal pain.  All other systems reviewed and are negative.    Physical Exam Updated Vital Signs BP (!) 123/54   Pulse 95   Temp 100.3 F (37.9 C) (Oral)   Resp (!) 25   SpO2 98%   Physical Exam Physical Exam  Nursing note and vitals reviewed. Constitutional: non-toxic, and in no acute distress Head: Normocephalic and atraumatic.  Mouth/Throat: Oropharynx is clear and moist.  Neck: Normal range of motion. Neck supple.  Cardiovascular: Normal rate and regular rhythm.   Pulmonary/Chest: Effort normal and breath sounds normal.  Abdominal: Soft. There is no tenderness. There is no rebound and no guarding.  Musculoskeletal: Warmth induration erythema to the left upper extremity without significant tenderness Neurological: Alert, no facial droop, fluent speech, moves all extremities symmetrically Skin: Skin is warm and dry.  Psychiatric: Cooperative   ED Treatments / Results  Labs (all labs ordered are listed, but only abnormal results are displayed) Labs Reviewed  COMPREHENSIVE METABOLIC PANEL - Abnormal; Notable for the following:       Result Value   Glucose, Bld 124 (*)    BUN 22 (*)    Creatinine, Ser 1.25 (*)    GFR calc non Af Amer 41 (*)    GFR calc Af Amer 47 (*)    All other components within normal limits  CBC WITH DIFFERENTIAL/PLATELET - Abnormal; Notable for the following:    Neutro Abs 9.0 (*)    Lymphs Abs 0.6 (*)    All  other components within normal limits  URINALYSIS, ROUTINE W REFLEX MICROSCOPIC - Abnormal; Notable for the following:    Nitrite POSITIVE (*)    Leukocytes, UA SMALL (*)    Bacteria, UA MANY (*)    All other components within normal limits  I-STAT CG4 LACTIC ACID, ED - Abnormal; Notable for the following:    Lactic Acid, Venous 2.72 (*)    All other components within normal limits  CULTURE, BLOOD (ROUTINE X 2)  CULTURE, BLOOD (ROUTINE X 2)  URINE CULTURE  PROTIME-INR  I-STAT TROPONIN, ED  I-STAT CG4 LACTIC ACID, ED    EKG  EKG Interpretation None       Radiology Dg Chest 2 View  Result Date: 09/22/2017 CLINICAL DATA:  76 year old female with chest pain. EXAM: CHEST  2 VIEW COMPARISON:  Chest radiograph dated 06/01/2017 FINDINGS:  Minimal left lung base atelectatic changes. There is no focal consolidation, pleural effusion, or pneumothorax. The cardiac silhouette is within normal limits. There is mild atherosclerotic calcification of the aortic arch. Surgical clips noted in the left axilla. No acute osseous pathology. IMPRESSION: No active cardiopulmonary disease. Electronically Signed   By: Anner Crete M.D.   On: 09/22/2017 21:11    Procedures Procedures (including critical care time) CRITICAL CARE Performed by: Forde Dandy   Total critical care time: 31 minutes  Critical care time was exclusive of separately billable procedures and treating other patients.  Critical care was necessary to treat or prevent imminent or life-threatening deterioration.  Critical care was time spent personally by me on the following activities: development of treatment plan with patient and/or surrogate as well as nursing, discussions with consultants, evaluation of patient's response to treatment, examination of patient, obtaining history from patient or surrogate, ordering and performing treatments and interventions, ordering and review of laboratory studies, ordering and review of  radiographic studies, pulse oximetry and re-evaluation of patient's condition.  Medications Ordered in ED Medications  sodium chloride 0.9 % bolus 1,000 mL (0 mLs Intravenous Stopped 09/22/17 2307)  sodium chloride 0.9 % bolus 1,000 mL (0 mLs Intravenous Stopped 09/22/17 2307)  ceFAZolin (ANCEF) IVPB 1 g/50 mL premix (1 g Intravenous New Bag/Given 09/22/17 2310)  sodium chloride 0.9 % bolus 500 mL (500 mLs Intravenous New Bag/Given 09/22/17 2308)     Initial Impression / Assessment and Plan / ED Course  I have reviewed the triage vital signs and the nursing notes.  Pertinent labs & imaging results that were available during my care of the patient were reviewed by me and considered in my medical decision making (see chart for details).     76 year old female who presents with episode of chest pain, nausea and vomiting.  I am concerned about sepsis that she did have fever prior to arrival of 103F, and she's been mildly tachycardic. Normotensive in no respiratory distress.  Source of infection could be UTI versus cellulitis of the left upper extremity. She does have redness, erythema and warmth of the left upper extremity without significant tenderness. UA was nitrite positive and leukocyte. Blood cultures urine cultures are obtained. She did receive 2.5 L of IV fluids per protocol with Ancef as antibiotic coverage. Chest x-ray visualized and shows no evidence of infection.   EKG non-ischemic. Troponin normal. Chest pain free since in ED.   Discussed with Dr. Blaine Hamper who will admit.   Final Clinical Impressions(s) / ED Diagnoses   Final diagnoses:  Left arm cellulitis  Lower urinary tract infectious disease    New Prescriptions New Prescriptions   No medications on file     Forde Dandy, MD 09/22/17 2343

## 2017-09-22 NOTE — ED Notes (Signed)
PT taken to xray

## 2017-09-23 ENCOUNTER — Inpatient Hospital Stay (HOSPITAL_COMMUNITY): Payer: Medicare Other

## 2017-09-23 ENCOUNTER — Inpatient Hospital Stay (HOSPITAL_COMMUNITY)
Admit: 2017-09-23 | Discharge: 2017-09-23 | Disposition: A | Discharge status: Home / Self Care | Payer: Medicare Other | Attending: Internal Medicine | Admitting: Internal Medicine

## 2017-09-23 DIAGNOSIS — R072 Precordial pain: Secondary | ICD-10-CM

## 2017-09-23 DIAGNOSIS — M7989 Other specified soft tissue disorders: Secondary | ICD-10-CM

## 2017-09-23 DIAGNOSIS — I25118 Atherosclerotic heart disease of native coronary artery with other forms of angina pectoris: Secondary | ICD-10-CM

## 2017-09-23 LAB — BLOOD CULTURE ID PANEL (REFLEXED)

## 2017-09-23 LAB — BASIC METABOLIC PANEL
Anion gap: 8 (ref 5–15)
BUN: 16 mg/dL (ref 6–20)
CO2: 21 mmol/L — ABNORMAL LOW (ref 22–32)
Calcium: 7.8 mg/dL — ABNORMAL LOW (ref 8.9–10.3)
Chloride: 109 mmol/L (ref 101–111)
Creatinine, Ser: 0.98 mg/dL (ref 0.44–1.00)
GFR calc Af Amer: >60 mL/min (ref >60)
GFR calc non Af Amer: 55 mL/min — ABNORMAL LOW (ref >60)
Glucose, Bld: 111 mg/dL — ABNORMAL HIGH (ref 65–99)
Potassium: 3.6 mmol/L (ref 3.5–5.1)
Sodium: 138 mmol/L (ref 135–145)

## 2017-09-23 LAB — GLUCOSE, CAPILLARY: Glucose-Capillary: 115 mg/dL — ABNORMAL HIGH (ref 65–99)

## 2017-09-23 LAB — CBC
HCT: 32.5 % — ABNORMAL LOW (ref 36.0–46.0)
HCT: 30.5 % — ABNORMAL LOW (ref 36.0–46.0)
Hemoglobin: 10.4 g/dL — ABNORMAL LOW (ref 12.0–15.0)
Hemoglobin: 9.8 g/dL — ABNORMAL LOW (ref 12.0–15.0)
MCH: 29.7 pg (ref 26.0–34.0)
MCH: 29.6 pg (ref 26.0–34.0)
MCHC: 32 g/dL (ref 30.0–36.0)
MCHC: 32.1 g/dL (ref 30.0–36.0)
MCV: 92.9 fL (ref 78.0–100.0)
MCV: 92.1 fL (ref 78.0–100.0)
Platelets: 170 10*3/uL (ref 150–400)
Platelets: 153 10*3/uL (ref 150–400)
RBC: 3.5 MIL/uL — ABNORMAL LOW (ref 3.87–5.11)
RBC: 3.31 MIL/uL — ABNORMAL LOW (ref 3.87–5.11)
RDW: 14 % (ref 11.5–15.5)
RDW: 14.2 % (ref 11.5–15.5)
WBC: 12.9 10*3/uL — ABNORMAL HIGH (ref 4.0–10.5)
WBC: 11.2 10*3/uL — ABNORMAL HIGH (ref 4.0–10.5)

## 2017-09-23 LAB — TROPONIN I
Troponin I: 0.04 ng/mL (ref <0.03)
Troponin I: 0.03 ng/mL (ref <0.03)
Troponin I: 0.03 ng/mL (ref <0.03)

## 2017-09-23 LAB — PROCALCITONIN: Procalcitonin: 0.94 ng/mL

## 2017-09-23 LAB — SEDIMENTATION RATE: Sed Rate: 21 mm/hr (ref 0–22)

## 2017-09-23 LAB — LIPID PANEL
Cholesterol: 88 mg/dL (ref 0–200)
HDL: 35 mg/dL — ABNORMAL LOW (ref >40)
LDL Cholesterol: 42 mg/dL (ref 0–99)
Total CHOL/HDL Ratio: 2.5 RATIO
Triglycerides: 55 mg/dL (ref <150)
VLDL: 11 mg/dL (ref 0–40)

## 2017-09-23 LAB — ECHOCARDIOGRAM COMPLETE
Height: 68 in
Weight: 2932.8 oz

## 2017-09-23 LAB — LACTIC ACID, PLASMA
Lactic Acid, Venous: 2.3 mmol/L (ref 0.5–1.9)
Lactic Acid, Venous: 1.4 mmol/L (ref 0.5–1.9)

## 2017-09-23 LAB — C-REACTIVE PROTEIN: CRP: 2.4 mg/dL — ABNORMAL HIGH (ref <1.0)

## 2017-09-23 LAB — HEMOGLOBIN A1C
Hgb A1c MFr Bld: 5.5 % (ref 4.8–5.6)
Mean Plasma Glucose: 111.15 mg/dL

## 2017-09-23 MED ORDER — ONDANSETRON HCL 4 MG PO TABS: 4.0000 mg | ORAL_TABLET | Freq: Four times a day (QID) | ORAL | Status: DC | PRN

## 2017-09-23 MED ORDER — HYDRALAZINE HCL 20 MG/ML IJ SOLN: 5.0000 mg | INTRAMUSCULAR | Status: DC | PRN

## 2017-09-23 MED ORDER — VANCOMYCIN HCL 10 G IV SOLR
1500.0000 mg | INTRAVENOUS | Status: DC
  Administered 2017-09-23 – 2017-09-24 (×2): 1500 mg via INTRAVENOUS
  Filled 2017-09-23 (×2): qty 1500

## 2017-09-23 MED ORDER — SODIUM CHLORIDE 0.9 % IV SOLN
INTRAVENOUS | Status: DC
  Administered 2017-09-23: 01:00:00 via INTRAVENOUS

## 2017-09-23 MED ORDER — MORPHINE SULFATE (PF) 4 MG/ML IV SOLN
1.0000 mg | INTRAVENOUS | Status: DC | PRN
Start: 2017-09-23 — End: 2017-09-24

## 2017-09-23 MED ORDER — ANASTROZOLE 1 MG PO TABS
1.0000 mg | ORAL_TABLET | Freq: Every day | ORAL | Status: DC
  Administered 2017-09-23 – 2017-09-26 (×4): 1 mg via ORAL
  Filled 2017-09-23 (×4): qty 1

## 2017-09-23 MED ORDER — IRBESARTAN 150 MG PO TABS: 150.0000 mg | ORAL_TABLET | Freq: Every day | ORAL | Status: DC

## 2017-09-23 MED ORDER — ACETAMINOPHEN 325 MG PO TABS
650.0000 mg | ORAL_TABLET | Freq: Four times a day (QID) | ORAL | Status: DC | PRN
  Administered 2017-09-23 – 2017-09-24 (×3): 650 mg via ORAL
  Filled 2017-09-23 (×3): qty 2

## 2017-09-23 MED ORDER — SODIUM CHLORIDE 0.9 % IV BOLUS (SEPSIS)
500.0000 mL | Freq: Once | INTRAVENOUS | Status: AC
  Administered 2017-09-23: 500 mL via INTRAVENOUS

## 2017-09-23 MED ORDER — DEXTROSE 5 % IV SOLN
1.0000 g | INTRAVENOUS | Status: DC
  Administered 2017-09-23: 1 g via INTRAVENOUS
  Filled 2017-09-23: qty 10

## 2017-09-23 MED ORDER — ASPIRIN EC 81 MG PO TBEC
81.0000 mg | DELAYED_RELEASE_TABLET | Freq: Every day | ORAL | Status: DC
  Administered 2017-09-23 – 2017-09-26 (×4): 81 mg via ORAL
  Filled 2017-09-23 (×4): qty 1

## 2017-09-23 MED ORDER — OXYCODONE-ACETAMINOPHEN 5-325 MG PO TABS
1.0000 | ORAL_TABLET | ORAL | Status: DC | PRN
Start: 2017-09-23 — End: 2017-09-26
  Administered 2017-09-23: 1 via ORAL
  Filled 2017-09-23: qty 1

## 2017-09-23 MED ORDER — NITROGLYCERIN 0.4 MG SL SUBL: 0.4000 mg | SUBLINGUAL_TABLET | SUBLINGUAL | Status: DC | PRN

## 2017-09-23 MED ORDER — CEFTRIAXONE SODIUM 2 G IJ SOLR
2.0000 g | INTRAMUSCULAR | Status: DC
  Administered 2017-09-24 – 2017-09-26 (×3): 2 g via INTRAVENOUS
  Filled 2017-09-23 (×4): qty 2

## 2017-09-23 MED ORDER — ONDANSETRON HCL 4 MG/2ML IJ SOLN
4.0000 mg | Freq: Four times a day (QID) | INTRAMUSCULAR | Status: DC | PRN
  Administered 2017-09-23: 4 mg via INTRAVENOUS
  Filled 2017-09-23: qty 2

## 2017-09-23 MED ORDER — ATORVASTATIN CALCIUM 20 MG PO TABS
20.0000 mg | ORAL_TABLET | Freq: Every day | ORAL | Status: DC
  Administered 2017-09-23 – 2017-09-25 (×3): 20 mg via ORAL
  Filled 2017-09-23 (×3): qty 1

## 2017-09-23 MED ORDER — ENOXAPARIN SODIUM 40 MG/0.4ML ~~LOC~~ SOLN
40.0000 mg | Freq: Every day | SUBCUTANEOUS | Status: DC
  Administered 2017-09-23 – 2017-09-26 (×4): 40 mg via SUBCUTANEOUS
  Filled 2017-09-23 (×4): qty 0.4

## 2017-09-23 MED ORDER — CALCIUM CARBONATE-VITAMIN D 500-200 MG-UNIT PO TABS
1.0000 | ORAL_TABLET | Freq: Every day | ORAL | Status: DC
  Administered 2017-09-23 – 2017-09-26 (×4): 1 via ORAL
  Filled 2017-09-23 (×4): qty 1

## 2017-09-23 MED ORDER — ACETAMINOPHEN 650 MG RE SUPP: 650.0000 mg | Freq: Four times a day (QID) | RECTAL | Status: DC | PRN

## 2017-09-23 MED ORDER — OMEGA-3-ACID ETHYL ESTERS 1 G PO CAPS
1.0000 g | ORAL_CAPSULE | Freq: Every day | ORAL | Status: DC
  Administered 2017-09-23 – 2017-09-26 (×4): 1 g via ORAL
  Filled 2017-09-23 (×4): qty 1

## 2017-09-23 MED ORDER — ADULT MULTIVITAMIN W/MINERALS CH
1.0000 | ORAL_TABLET | Freq: Every day | ORAL | Status: DC
  Administered 2017-09-23 – 2017-09-26 (×4): 1 via ORAL
  Filled 2017-09-23 (×4): qty 1

## 2017-09-23 MED ORDER — ZOLPIDEM TARTRATE 5 MG PO TABS: 5.0000 mg | ORAL_TABLET | Freq: Every evening | ORAL | Status: DC | PRN

## 2017-09-23 NOTE — Plan of Care (Signed)
Problem: Skin Integrity: Goal: Skin integrity will improve Outcome: Not Met (add Reason) New admission

## 2017-09-23 NOTE — Progress Notes (Signed)
Patient provided written material on cellulitis at bedside.

## 2017-09-23 NOTE — Progress Notes (Signed)
Left Upper Extremity Venous Duplex complete, please see CV Proc or Results Review tab for preliminary report. [place], RVT 9:26 AM  09/23/2017

## 2017-09-23 NOTE — ED Notes (Signed)
No chest pain at this time

## 2017-09-23 NOTE — Progress Notes (Addendum)
Patient ID: Wendy [name], female   DOB: February 09, 1941, 76 y.o.   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 She was found to have elevated lactic acid with positive urinalysis and was started on IV antibiotics.   Assessment & Plan:   Principal Problem:   Sepsis ([place]) Active Problems:   Malignant neoplasm of upper-outer quadrant of left breast in female, estrogen receptor positive ([place])   CAD (coronary artery disease)   Essential hypertension   Obesity (BMI 30-39.9)   Hyperlipidemia   CKD (chronic kidney disease), stage III ([place])   Lower urinary tract infectious disease   Chest pain   Left arm cellulitis   Sepsis  - Probably secondary to cellulitis and UTI.  - Hemodynamically improving. Decrease normal saline to 75 mL an hour and if tolerates diet will discontinue IV fluids.  - Follow cultures. Continue Rocephin. Add vancomycin - Lactic acid improved  Left upper extremity cellulitis - In a patient with history of chronic lymphedema. Continue Rocephin. Follow left upper extremity Doppler to rule out DVT - Follow cultures  Probable UTI - Continue Rocephin. Follow cultures  Leukocytosis - Probably secondary to above. Plan as above. Repeat a.m. labs  chest pain in a patient with history of coronary artery disease and stent:   - Currently chest pain-free. Follow troponins. 2-D echo.  - Might need outpatient cardiology follow-up  - prn Nitroglycerin, Morphine; continue aspirin, lipitor   Malignant neoplasm of  upper-outer quadrant of left breast in female, estrogen receptor positive : s/p of L mastectomy -Continue anastrozole - Outpatient follow-up with oncology  HLD: -Continue Lipitor  Essential hypertension: -Monitor blood pressure. Restart Diovan-HCTZ once hemodynamics improve.  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 She denies current chest pain. No current nausea or vomiting. She feels slightly better.  Objective: Vitals:   09/23/17 0613 09/23/17 0630 09/23/17 0720 09/23/17 0806  BP: (!) 122/47 (!) 121/97 (!) 117/52 (!) 121/42  Pulse: 93 87 82 84  Resp: 19 (!) 32 15 17  Temp:  98.1 F (36.7 C)  98.6 F (37 C)  TempSrc:  Oral  Oral  SpO2: 99% 100% 98% 100%  Weight:    83.1 kg (183 lb 4.8 oz)  Height:    5\' 8"  (1.727 m)    Intake/Output Summary (Last 24 hours) at 09/23/17 1011 Last data filed at 09/23/17 0535  Gross per 24 hour  Intake             3050 ml  Output                0 ml  Net             3050 ml   Filed Weights   09/23/17 0806  Weight:  83.1 kg (183 lb 4.8 oz)    Examination:  General exam: Appears calm and comfortable  Respiratory system: Bilateral decreased breath sound at bases Cardiovascular system: S1 & S2 heard, rate controlled  Gastrointestinal system: Abdomen is nondistended, soft and nontender. Normal bowel sounds heard. Extremities: No cyanosis, clubbing; left arm and forearm have extensive erythema, swelling and warmth   Data Reviewed: I have personally reviewed following labs and imaging studies  CBC:  Recent Labs Lab 09/22/17 2003  09/23/17 0647 09/23/17 0828  WBC 10.1 12.9* 11.2*  NEUTROABS 9.0*  --   --   HGB 12.5 10.4* 9.8*  HCT 37.7 32.5* 30.5*  MCV 92.2 92.9 92.1  PLT 185 170 202   Basic Metabolic Panel:  Recent Labs Lab 09/22/17 2003 09/23/17 0828  NA 137 138  K 3.7 3.6  CL 103 109  CO2 22 21*  GLUCOSE 124* 111*  BUN 22* 16  CREATININE 1.25* 0.98  CALCIUM 9.2 7.8*   GFR: Estimated Creatinine Clearance: 55.2 mL/min (by C-G formula based on SCr of 0.98 mg/dL). Liver Function Tests:  Recent Labs Lab 09/22/17 2003  AST 37  ALT 31  ALKPHOS 66  BILITOT 0.6  PROT 6.6  ALBUMIN 3.9   No results for input(s): LIPASE, AMYLASE in the last 168 hours. No results for input(s): AMMONIA in the last 168 hours. Coagulation Profile:  Recent Labs Lab 09/22/17 2003  INR 1.01   Cardiac Enzymes:  Recent Labs Lab 09/23/17 0046 09/23/17 0828  TROPONINI 0.04* 0.03*   BNP (last 3 results) No results for input(s): PROBNP in the last 8760 hours. HbA1C:  Recent Labs  09/23/17 0337  HGBA1C 5.5   CBG:  Recent Labs Lab 09/23/17 0815  GLUCAP 115*   Lipid Profile:  Recent Labs  09/23/17 0337  CHOL 88  HDL 35*  LDLCALC 42  TRIG 55  CHOLHDL 2.5   Thyroid Function Tests: No results for input(s): TSH, T4TOTAL, FREET4, T3FREE, THYROIDAB in the last 72 hours. Anemia Panel: No results for input(s): VITAMINB12, FOLATE, FERRITIN, TIBC, IRON, RETICCTPCT in the last 72 hours. Sepsis Labs:  Recent Labs Lab 09/22/17 2148 09/23/17 0046 09/23/17 0357  PROCALCITON  --  0.94  --   LATICACIDVEN 2.72* 2.3* 1.4    No results found for this or any previous visit (from the past 240 hour(s)).       Radiology Studies: Dg Chest 2 View  Result Date: 09/22/2017 CLINICAL DATA:  76 year old female with chest pain. EXAM: CHEST  2 VIEW COMPARISON:  Chest radiograph dated 06/01/2017 FINDINGS: Minimal left lung base atelectatic changes. There is no focal consolidation, pleural effusion, or  pneumothorax. The cardiac silhouette is within normal limits. There is mild atherosclerotic calcification of the aortic arch. Surgical clips noted in the left axilla. No acute osseous pathology. IMPRESSION: No active cardiopulmonary disease. Electronically Signed   By: Anner Crete M.D.   On: 09/22/2017 21:11        Scheduled Meds: . anastrozole  1 mg Oral Daily  . aspirin EC  81 mg Oral Daily  . atorvastatin  20 mg Oral q1800  . calcium-vitamin D  1 tablet Oral Q breakfast  . enoxaparin (LOVENOX) injection  40 mg Subcutaneous Daily  . multivitamin with minerals  1 tablet Oral Daily  . omega-3 acid ethyl esters  1 g Oral Daily   Continuous Infusions: . sodium chloride 125 mL/hr at 09/23/17 0115  . cefTRIAXone (ROCEPHIN)  IV Stopped (09/23/17 0903)     LOS:  1 day        Aline August, MD [facility] Pager [phone]  If 7PM-7AM, please contact night-coverage [email or web address] Password [email or web address] 09/23/2017, 10:11 AM

## 2017-09-23 NOTE — Progress Notes (Signed)
Pharmacy Antibiotic Note  Wendy [name] is a 76 y.o. female admitted on 09/22/2017 with cellulitis.  Pharmacy has been consulted for Vancomycin  dosing.  Plan: Vancomycin 1500 mg iv Q 24 hours Follow Scr, progress, LOT  Height: 5\' 8"  (172.7 cm) Weight: 183 lb 4.8 oz (83.1 kg) IBW/kg (Calculated) : 63.9  Temp (24hrs), Avg:99 F (37.2 C), Min:98.1 F (36.7 C), Max:100.3 F (37.9 C)   Recent Labs Lab 09/22/17 2003 09/22/17 2148 09/23/17 0046 09/23/17 0357 09/23/17 0647 09/23/17 0828  WBC 10.1  --   --   --  12.9* 11.2*  CREATININE 1.25*  --   --   --   --  0.98  LATICACIDVEN  --  2.72* 2.3* 1.4  --   --     Estimated Creatinine Clearance: 55.2 mL/min (by C-G formula based on SCr of 0.98 mg/dL).    No Known Allergies   Thank you for allowing pharmacy to be a part of this patient's care. Anette Guarneri, PharmD [phone]  09/23/2017 10:33 AM

## 2017-09-23 NOTE — ED Notes (Signed)
Pt arm feels hot to touch and very red. EDP notified

## 2017-09-23 NOTE — Progress Notes (Signed)
PHARMACY - PHYSICIAN COMMUNICATION CRITICAL VALUE ALERT - BLOOD CULTURE IDENTIFICATION (BCID)  Results for orders placed or performed during the hospital encounter of 09/22/17  Blood Culture ID Panel (Reflexed) (Collected: 09/22/2017  8:08 PM)  Result Value Ref Range   Enterococcus species NOT DETECTED NOT DETECTED   Listeria monocytogenes NOT DETECTED NOT DETECTED   Staphylococcus species NOT DETECTED NOT DETECTED   Staphylococcus aureus NOT DETECTED NOT DETECTED   Streptococcus species DETECTED (A) NOT DETECTED   Streptococcus agalactiae NOT DETECTED NOT DETECTED   Streptococcus pneumoniae NOT DETECTED NOT DETECTED   Streptococcus pyogenes NOT DETECTED NOT DETECTED   Acinetobacter baumannii NOT DETECTED NOT DETECTED   Enterobacteriaceae species NOT DETECTED NOT DETECTED   Enterobacter cloacae complex NOT DETECTED NOT DETECTED   Escherichia coli NOT DETECTED NOT DETECTED   Klebsiella oxytoca NOT DETECTED NOT DETECTED   Klebsiella pneumoniae NOT DETECTED NOT DETECTED   Proteus species NOT DETECTED NOT DETECTED   Serratia marcescens NOT DETECTED NOT DETECTED   Haemophilus influenzae NOT DETECTED NOT DETECTED   Neisseria meningitidis NOT DETECTED NOT DETECTED   Pseudomonas aeruginosa NOT DETECTED NOT DETECTED   Candida albicans NOT DETECTED NOT DETECTED   Candida glabrata NOT DETECTED NOT DETECTED   Candida krusei NOT DETECTED NOT DETECTED   Candida parapsilosis NOT DETECTED NOT DETECTED   Candida tropicalis NOT DETECTED NOT DETECTED    Name of physician (or Provider) Contacted: Alekh  Changes to prescribed antibiotics required: No changes required at this time, follow up Cx results.  Carnella Gu[place]aj[place] 09/23/2017  1:59 PM

## 2017-09-23 NOTE — ED Notes (Signed)
Date and time results received: 09/23/17 1:42 AM    Test: lactic acid Critical Value: 2.3  Name of Provider Notified: Blaine Hamper, MD  Orders Received? Or Actions Taken?:

## 2017-09-23 NOTE — Progress Notes (Signed)
  Echocardiogram 2D Echocardiogram has been performed.  Wendy [name] 09/23/2017, 2:50 PM

## 2017-09-24 DIAGNOSIS — N390 Urinary tract infection, site not specified: Secondary | ICD-10-CM

## 2017-09-24 DIAGNOSIS — L03114 Cellulitis of left upper limb: Secondary | ICD-10-CM

## 2017-09-24 DIAGNOSIS — B955 Unspecified streptococcus as the cause of diseases classified elsewhere: Secondary | ICD-10-CM

## 2017-09-24 DIAGNOSIS — R7881 Bacteremia: Secondary | ICD-10-CM

## 2017-09-24 DIAGNOSIS — N183 Chronic kidney disease, stage 3 (moderate): Secondary | ICD-10-CM

## 2017-09-24 LAB — CBC
HCT: 32.5 % — ABNORMAL LOW (ref 36.0–46.0)
Hemoglobin: 10.3 g/dL — ABNORMAL LOW (ref 12.0–15.0)
MCH: 29.6 pg (ref 26.0–34.0)
MCHC: 31.7 g/dL (ref 30.0–36.0)
MCV: 93.4 fL (ref 78.0–100.0)
Platelets: 148 10*3/uL — ABNORMAL LOW (ref 150–400)
RBC: 3.48 MIL/uL — ABNORMAL LOW (ref 3.87–5.11)
RDW: 14.2 % (ref 11.5–15.5)
WBC: 6.6 10*3/uL (ref 4.0–10.5)

## 2017-09-24 LAB — COMPREHENSIVE METABOLIC PANEL
ALT: 21 U/L (ref 14–54)
AST: 25 U/L (ref 15–41)
Albumin: 3 g/dL — ABNORMAL LOW (ref 3.5–5.0)
Alkaline Phosphatase: 51 U/L (ref 38–126)
Anion gap: 7 (ref 5–15)
BUN: 10 mg/dL (ref 6–20)
CO2: 22 mmol/L (ref 22–32)
Calcium: 8.3 mg/dL — ABNORMAL LOW (ref 8.9–10.3)
Chloride: 107 mmol/L (ref 101–111)
Creatinine, Ser: 1.01 mg/dL — ABNORMAL HIGH (ref 0.44–1.00)
GFR calc Af Amer: >60 mL/min (ref >60)
GFR calc non Af Amer: 53 mL/min — ABNORMAL LOW (ref >60)
Glucose, Bld: 112 mg/dL — ABNORMAL HIGH (ref 65–99)
Potassium: 3.6 mmol/L (ref 3.5–5.1)
Sodium: 136 mmol/L (ref 135–145)
Total Bilirubin: 0.6 mg/dL (ref 0.3–1.2)
Total Protein: 5.5 g/dL — ABNORMAL LOW (ref 6.5–8.1)

## 2017-09-24 LAB — GLUCOSE, CAPILLARY: Glucose-Capillary: 104 mg/dL — ABNORMAL HIGH (ref 65–99)

## 2017-09-24 MED ORDER — BISACODYL 5 MG PO TBEC
5.0000 mg | DELAYED_RELEASE_TABLET | Freq: Once | ORAL | Status: AC
  Administered 2017-09-24: 5 mg via ORAL
  Filled 2017-09-24: qty 1

## 2017-09-24 NOTE — Evaluation (Signed)
Physical Therapy Evaluation Patient Details Name: Wendy [name] MRN: [id number] DOB: [birth year] Today's Date: 09/24/2017   History of Present Illness  76 y.o. female with medical history significant of hypertension, hyperlipidemia, CAD, stent placement, PVD, CKD-3, left breast cancer (s/p of mastectomy), who presents with fever, left arm erythema and swelling, urinary urgency, chest pain.pt was found to have WBC 10.1, lactic acid 2.72, positive urinalysis with small amount of leukocytes and positive nitrite, INR 1.01, stable renal function, temperature 100.3, tachycardia, tachypnea, O2 sat 97% on room air, negative chest x-ray.    Past Medical History:  Diagnosis Date  . Breast cancer    left breast  . CAD (coronary artery disease)   . Hyperlipidemia   . Hypertension   . Myocardial infarction ([place]) 11/22/04  . Peripheral vascular disease ([name])    atherosclerosis carotid artery-mild     Clinical Impression  Patient evaluated by Physical Therapy with no further acute PT needs identified. All education has been completed and the patient has no further questions. Pt is independent with mobility. See below for any follow-up Physical Therapy or equipment needs. PT is signing off. Thank you for this referral.     Follow Up Recommendations No PT follow up    Equipment Recommendations  None recommended by PT       Precautions / Restrictions Precautions Precautions: None Restrictions Weight Bearing Restrictions: No      Mobility  Bed Mobility Overal bed mobility: Independent                Transfers Overall transfer level: Independent Equipment used: None Transfers: Sit to/from Stand Sit to Stand: Independent         General transfer comment: good power up and steadying in standing  Ambulation/Gait Ambulation/Gait assistance: Modified independent (Device/Increase time) Ambulation Distance (Feet): 350 Feet Assistive device:  (pushed IV pole but not needed for  steadying) Gait Pattern/deviations: WFL(Within Functional Limits) Gait velocity: WFL Gait velocity interpretation: >2.62 ft/sec, indicative of independent community ambulator General Gait Details: steady gait with no instability, slightly slower cadence         Balance Overall balance assessment: No apparent balance deficits (not formally assessed)                                           Pertinent Vitals/Pain Pain Assessment: 0-10 Pain Score: 5  Pain Location: L arm Pain Descriptors / Indicators: Aching;Sore Pain Intervention(s): Limited activity within patient's tolerance;Monitored during session    [place] expects to be discharged to:: Private residence Living Arrangements: Alone Available Help at Discharge: Family;Friend(s);Available PRN/intermittently Type of Home: House Home Access: Stairs to enter Entrance Stairs-Rails: None Entrance Stairs-Number of Steps: 2 Home Layout: One level Home Equipment: Crutches;Cane - single point      Prior Function Level of Independence: Independent         Comments: works for [employer] [place] Hand: Right    Extremity/Trunk Assessment   Upper Extremity Assessment Upper Extremity Assessment: LUE deficits/detail LUE Deficits / Details: ROM limited by pain and cellulitis edema LUE: Unable to fully assess due to pain    Lower Extremity Assessment Lower Extremity Assessment: Overall WFL for tasks assessed    Cervical / Trunk Assessment Cervical / Trunk Assessment: Normal  Communication   Communication: No difficulties  Cognition Arousal/Alertness: Awake/alert Behavior  During Therapy: WFL for tasks assessed/performed Overall Cognitive Status: Within Functional Limits for tasks assessed                                        General Comments General comments (skin integrity, edema, etc.): L UE edema from cellulitis, painful  to touch        Assessment/Plan    PT Assessment [place] does not need any further PT services         PT Goals (Current goals can be found in the Care Plan section)  Acute Rehab PT Goals Patient Stated Goal: go home PT Goal Formulation: With patient     AM-PAC PT "6 Clicks" Daily Activity  Outcome Measure Difficulty turning over in bed (including adjusting bedclothes, sheets and blankets)?: None Difficulty moving from lying on back to sitting on the side of the bed? : None Difficulty sitting down on and standing up from a chair with arms (e.g., wheelchair, bedside commode, etc,.)?: None Help needed moving to and from a bed to chair (including a wheelchair)?: None Help needed walking in hospital room?: None Help needed climbing 3-5 steps with a railing? : None 6 Click Score: 24    End of Session Equipment Utilized During Treatment: Gait belt Activity Tolerance: Patient tolerated treatment well Patient left: in bed;with call bell/phone within reach Nurse Communication: Mobility status PT Visit Diagnosis: Pain Pain - Right/Left: Left Pain - part of body: Arm    Time: 6440-3474 PT Time Calculation (min) (ACUTE ONLY): 20 min   Charges:   PT Evaluation $PT Eval Low Complexity: 1 Low     PT G Codes:        Rama Mcclintock B. Migdalia Dk PT, DPT Acute Rehabilitation  [phone] Pager [phone]    [place] 09/24/2017, 11:05 AM

## 2017-09-24 NOTE — Progress Notes (Signed)
Spoke with Pt earlier this am regarding a bath. She wanted to wait and see if she could be disconnected from IV first. Spoke with RN regarding matter. Rechecked with patient and she stated that she would like a bath, but not a this time. Tech suggested an after lunch bath. Pt agreed.

## 2017-09-24 NOTE — Progress Notes (Signed)
PROGRESS NOTE  Wendy [name] OEV:[id number] DOB: [birth year] DOA: 09/22/2017 PCP: [facility]  Brief Narrative: 76 year old woman PMH essential hypertension, coronary artery disease, chronic kidney disease, left-sided breast cancer status post mastectomy, presented with fever, left arm erythema, chest pain.  Admitted for sepsis secondary to left arm cellulitis, possible UTI  Assessment/Plan Left upper extremity cellulitis.  Noted history of mastectomy on that side with intermittent swelling. -No significant improvement yet.  Continue empiric antibiotics.  No evidence of complicating features. -Left upper extremity venous Doppler was negative for DVT.  Streptococcus mitis/oralis bacteremia 2/2 bottles - Etiology unclear.  Murmur on examination. -Request transesophageal echocardiogram to rule out endocarditis. -Infectious disease consultation 11/1  Chest pain.  With radiation to left arm, shoulder, neck.  No shortness of breath.  Not pleuritic.  Recently treated for an upper respiratory tract infection with azithromycin. -Resolved.  Troponins flat.  No further evaluation.  Continue aspirin, Lipitor  E coli UTI. - Sensitivity pending.  Continue ceftriaxone.  Essential hypertension -Stable.  Continue Diovan, hydrochlorothiazide  PMH CAD status post stent.  LDL 42.  PMH left-sided breast cancer, status post mastectomy -Continue anastrozole  DVT prophylaxis: enoxparin Code Status: DNR Family Communication: none  Disposition Plan: home with no PT follow-up    Murray Hodgkins, MD  [facility] Direct contact: [phone] --[place]ia [place]  --[email or web address]; password [email or web address]  7PM-7AM contact night coverage as above 09/24/2017, 6:26 PM  LOS: 2 days   Consultants:    Procedures:  2-d echo Study Conclusions  - Left ventricle: The cavity size was normal. Wall thickness was   normal. Systolic function was vigorous. The estimated  ejection   fraction was in the range of 65% to 70%. Wall motion was normal;   there were no regional wall motion abnormalities. Peak velocity   through outflow tract was 2.43m/s. No significant outflow tract   obstruction. Doppler parameters are consistent with abnormal left   ventricular relaxation (grade 1 diastolic dysfunction). - Aortic valve: Trileaflet; mildly thickened, mildly calcified   leaflets. - Mitral valve: Calcified annulus. - Tricuspid valve: There was mild regurgitation.  Antimicrobials:  Ceftriaxone 10/30 >>  Vancomycin 10/30 >>  Interval history/Subjective: Overall feels okay.  No chest pain.  However her left arm has had no significant improvement.  Tender to touch.  Objective: Vitals: Temperature maximum 100.9.  97.4, 18, 76, 112/61, 100% on room air.  Exam:     Constitutional: Appears calm, comfortable.  Cardiovascular.  Regular rate and rhythm.  2/6 systolic murmur best heard right upper sternal border.  No rub or gallop.  No lower extremity edema.  Respiratory.  Clear to auscultation bilaterally.  No wheezes, rales or rhonchi.  Normal respiratory effort.  Skin.  Left upper extremity erythema notable over the bicep extending down to the antecubital fossa and forearm in patchy distribution.  More prominent anteriorly than posteriorly.  Warm to touch, somewhat tender.  No fluctuance or fluid collection noted.  No open wounds or drainage.  Radial pulses 2+.  Hand with normal perfusion.  Right upper extremity is edematous, 2+.  Psychiatric.  Grossly normal mood and affect.  Speech fluent and appropriate.   I have personally reviewed the following:   Labs:  Complete metabolic panel unremarkable.  Hemoglobin stable 10.3.  No leukocytosis.  Troponins flat, 0.03, 0.04  Imaging studies:  CXR no acute disease  Medical tests:  Left upper extremity venous ultrasound negative for DVT.   Scheduled Meds: . anastrozole  1 mg  Oral Daily  . aspirin EC   81 mg Oral Daily  . atorvastatin  20 mg Oral q1800  . calcium-vitamin D  1 tablet Oral Q breakfast  . enoxaparin (LOVENOX) injection  40 mg Subcutaneous Daily  . multivitamin with minerals  1 tablet Oral Daily  . omega-3 acid ethyl esters  1 g Oral Daily   Continuous Infusions: . cefTRIAXone (ROCEPHIN)  IV Stopped (09/24/17 1050)    Principal Problem:   Streptococcal bacteremia Active Problems:   Malignant neoplasm of upper-outer quadrant of left breast in female, estrogen receptor positive (HCC)   CAD (coronary artery disease)   Essential hypertension   Obesity (BMI 30-39.9)   CKD (chronic kidney disease), stage III ([place])   Lower urinary tract infectious disease   Chest pain   Left arm cellulitis   LOS: 2 days

## 2017-09-25 DIAGNOSIS — R8271 Bacteriuria: Secondary | ICD-10-CM

## 2017-09-25 DIAGNOSIS — I129 Hypertensive chronic kidney disease with stage 1 through stage 4 chronic kidney disease, or unspecified chronic kidney disease: Secondary | ICD-10-CM

## 2017-09-25 DIAGNOSIS — N189 Chronic kidney disease, unspecified: Secondary | ICD-10-CM

## 2017-09-25 DIAGNOSIS — Z9012 Acquired absence of left breast and nipple: Secondary | ICD-10-CM

## 2017-09-25 DIAGNOSIS — R011 Cardiac murmur, unspecified: Secondary | ICD-10-CM

## 2017-09-25 DIAGNOSIS — I2510 Atherosclerotic heart disease of native coronary artery without angina pectoris: Secondary | ICD-10-CM

## 2017-09-25 DIAGNOSIS — Z853 Personal history of malignant neoplasm of breast: Secondary | ICD-10-CM

## 2017-09-25 DIAGNOSIS — L539 Erythematous condition, unspecified: Secondary | ICD-10-CM

## 2017-09-25 LAB — CULTURE, BLOOD (ROUTINE X 2): Special Requests: ADEQUATE

## 2017-09-25 LAB — URINE CULTURE: Culture: >=100000 — AB

## 2017-09-25 NOTE — Anesthesia Preprocedure Evaluation (Addendum)
Anesthesia Evaluation  Patient identified by MRN, date of birth, ID band Patient awake    Reviewed: Allergy & Precautions, NPO status , Patient's Chart, lab work & pertinent test results  Airway Mallampati: II  TM Distance: >3 FB Neck ROM: Full    Dental   Pulmonary former smoker,    breath sounds clear to auscultation       Cardiovascular hypertension, Pt. on medications + CAD, + Past MI, + Cardiac Stents and + Peripheral Vascular Disease   Rhythm:Regular Rate:Normal     Neuro/Psych negative neurological ROS     GI/Hepatic negative GI ROS, Neg liver ROS,   Endo/Other  negative endocrine ROS  Renal/GU Renal disease     Musculoskeletal   Abdominal   Peds  Hematology  (+) anemia ,   Anesthesia Other Findings   Reproductive/Obstetrics                            Lab Results  Component Value Date   WBC 6.6 09/24/2017   HGB 10.3 (L) 09/24/2017   HCT 32.5 (L) 09/24/2017   MCV 93.4 09/24/2017   PLT 148 (L) 09/24/2017   Lab Results  Component Value Date   CREATININE 1.01 (H) 09/24/2017   BUN 10 09/24/2017   NA 136 09/24/2017   K 3.6 09/24/2017   CL 107 09/24/2017   CO2 22 09/24/2017    Anesthesia Physical Anesthesia Plan  ASA: III  Anesthesia Plan: MAC   Post-op Pain Management:    Induction: Intravenous  PONV Risk Score and Plan: 2 and Ondansetron, Propofol infusion and Treatment may vary due to age or medical condition  Airway Management Planned: Natural Airway and Nasal Cannula  Additional Equipment:   Intra-op Plan:   Post-operative Plan:   Informed Consent: I have reviewed the patients History and Physical, chart, labs and discussed the procedure including the risks, benefits and alternatives for the proposed anesthesia with the patient or authorized representative who has indicated his/her understanding and acceptance.     Plan Discussed with: CRNA  Anesthesia  Plan Comments:        Anesthesia Quick Evaluation

## 2017-09-25 NOTE — Progress Notes (Signed)
PROGRESS NOTE  Wendy [name]:[id number] DOB: [birth year] DOA: 09/22/2017 PCP: [facility]  Brief Narrative: 76 year old woman PMH essential hypertension, coronary artery disease, chronic kidney disease, left-sided breast cancer status post mastectomy, presented with fever, left arm erythema, chest pain.  Admitted for sepsis secondary to left arm cellulitis, possible UTI  Assessment/Plan Left upper extremity cellulitis.  Noted history of mastectomy on that side with intermittent swelling. Left upper extremity venous Doppler was negative for DVT. -improving slowly. Continue ceftriaxone. No evidence of complicating features  Streptococcus mitis/oralis bacteremia 2/2 bottles - no recent dental work or bites. Dentition on exam appears good - murmur noted. TEE requested to r/o endocarditis. - ID consulted  Chest pain.  With radiation to left arm, shoulder, neck.  No shortness of breath.  Not pleuritic.  Recently treated for an upper respiratory tract infection with azithromycin. -resolved, troponins flat. No further evaluation. Continue aspirin, Lipitor  E coli UTI, pansensitive. - continue ceftriaxone  Essential hypertension -remains stable. Continue Diovan, hydrochlorothiazide  PMH CAD status post stent.  LDL 42.  PMH left-sided breast cancer, status post mastectomy -Continue anastrozole  DVT prophylaxis: enoxparin Code Status: DNR Family Communication: none  Disposition Plan: home with no PT follow-up    Murray Hodgkins, MD  [facility] Direct contact: [phone] --Via [place]  --[email or web address]; password [email or web address]  7PM-7AM contact night coverage as above 09/25/2017, 11:42 AM  LOS: 3 days   Consultants:    Procedures:  2-d echo Study Conclusions  - Left ventricle: The cavity size was normal. Wall thickness was   normal. Systolic function was vigorous. The estimated ejection   fraction was in the range of 65% to 70%.  Wall motion was normal;   there were no regional wall motion abnormalities. Peak velocity   through outflow tract was 2.74m/s. No significant outflow tract   obstruction. Doppler parameters are consistent with abnormal left   ventricular relaxation (grade 1 diastolic dysfunction). - Aortic valve: Trileaflet; mildly thickened, mildly calcified   leaflets. - Mitral valve: Calcified annulus. - Tricuspid valve: There was mild regurgitation.  Antimicrobials:  Ceftriaxone 10/30 >>  Vancomycin 10/30 >>  Interval history/Subjective: Feels better. Left arm feels better, less painful, less warm.  Objective: Vitals:   Vitals:   09/25/17 0046 09/25/17 0613  BP: 128/68 136/68  Pulse: 93 82  Resp: 20 18  Temp: 98.2 F (36.8 C) 98.6 F (37 C)  SpO2: 100% 100%     Exam: Constitutional:  . Appears calm and comfortable Eyes:  . pupils and irises appear normal ENMT:  . grossly normal hearing  . Oropharynx: mucosa, tongue, dentition appears normal Respiratory:  . CTA bilaterally, no w/r/r.  . Respiratory effort normal.  Cardiovascular:  . RRR, no m/r/g . No LE extremity edema   Musculoskeletal:  . LUE o strength and tone normal, no atrophy, no abnormal movements o Less tenderness to palpation today\ 2+ radial pulse LUE, perfusion of hand appears intact Skin:  . Decreasing erythema, warm and edema LUE Psychiatric:  . Mental status o Mood, affect appropriate  I have personally reviewed the following:   Labs:  CBG stable   Imaging studies:     Medical tests:  Left upper extremity venous ultrasound negative for DVT.   Scheduled Meds: . anastrozole  1 mg Oral Daily  . aspirin EC  81 mg Oral Daily  . atorvastatin  20 mg Oral q1800  . calcium-vitamin D  1 tablet Oral Q breakfast  . enoxaparin (  LOVENOX) injection  40 mg Subcutaneous Daily  . multivitamin with minerals  1 tablet Oral Daily  . omega-3 acid ethyl esters  1 g Oral Daily   Continuous Infusions: .  cefTRIAXone (ROCEPHIN)  IV Stopped (09/25/17 1101)    Principal Problem:   Streptococcal bacteremia Active Problems:   Malignant neoplasm of upper-outer quadrant of left breast in female, estrogen receptor positive (HCC)   CAD (coronary artery disease)   Essential hypertension   Obesity (BMI 30-39.9)   CKD (chronic kidney disease), stage III ([place])   Lower urinary tract infectious disease   Chest pain   Left arm cellulitis   LOS: 3 days

## 2017-09-25 NOTE — Consult Note (Signed)
[facility] for Infectious Disease    Date of Admission:  09/22/2017     Total days of antibiotics 3  Ceftriaxone 10/29 >>  Vancomycin 10/30/ >> 10/31              Reason for Consult: Streptococcus bacteremia / Cellulitis / UTI    Referring Provider: Sarajane Jews Primary Care Provider: [facility]   Assessment: 76 y/o female with PMH of HTN, CAD, CKD, left sided breast cancer post mastectomy addmitted with sepsis likely related to Cellulitis, E. Coli urinary tract infection and streptococcus mitis/oralis bacteremia Repeat blood cultures from 10/31 in process and TEE is pending. Current antibiotic is ceftriaxone.   Plan:  E. Coli Bacturia = Likely asymptomatic bacturia given no urinary symptoms and previous UTI's being more symptomatic. Will continue ceftriaxone for Strep bacteremia which will cover.   Streptococcus mitis/oralis bacteremia = Continue current dosage of ceftriaxone. Repeat blood cultures are pending with no growth to date. Agree with TEE to rule out endocarditis. Will obtain CBC w/diff in the morning.   Cellulitis/Erysipelas  = Findings are consistent with possible erysipelas given sudden onset and would be reasonable given the streptococcal bacteremia.  Does appear to be improving with current ceftriaxone. Consider additional imaging if symptoms do not continue to improve.    Principal Problem:   Streptococcal bacteremia Active Problems:   Malignant neoplasm of upper-outer quadrant of left breast in female, estrogen receptor positive (HCC)   CAD (coronary artery disease)   Essential hypertension   Obesity (BMI 30-39.9)   CKD (chronic kidney disease), stage III (HCC)   Lower urinary tract infectious disease   Chest pain   Left arm cellulitis   Scheduled Meds: . anastrozole  1 mg Oral Daily  . aspirin EC  81 mg Oral Daily  . atorvastatin  20 mg Oral q1800  . calcium-vitamin D  1 tablet Oral Q breakfast  . enoxaparin  (LOVENOX) injection  40 mg Subcutaneous Daily  . multivitamin with minerals  1 tablet Oral Daily  . omega-3 acid ethyl esters  1 g Oral Daily   Continuous Infusions: . cefTRIAXone (ROCEPHIN)  IV Stopped (09/25/17 1101)   PRN Meds:.acetaminophen **OR** acetaminophen, hydrALAZINE, nitroGLYCERIN, ondansetron **OR** ondansetron (ZOFRAN) IV, oxyCODONE-acetaminophen, zolpidem  HPI: Wendy [name] is a 76 y.o. female  With PMH of HTN, CAD, CKD, and left sided breast cancer s/p mastectomy presenting with fever, left arm errythemia, and chest pain. Per chart review she had urinary urgency, however during interview she denies any urinary symptoms prior to admission. She was previously treated by her PCP in the clinic for an URI and treated with prednisone and azithromycin. Denies any oral treatments/infections. Was in her normal state of health morning prior to admission.   Hospital Course: Evaluated in the ED and noted to have a WBC of 10.1, lactic acid of 2.72 and a UA that was positive for a small amount of leukocytes and nitrites. Temperature was 100.3 and she was tachypenic and tachycardic. Imaging with no active cardiopulmonary disease. She received 1 dosage of Ancef and was changed to ceftriaxone. Blood cultures were obtained and positive for streptococcus mitis/oralis. Urine culture was positive for E. Coli. TTE showed no evidence of vegetation. Venous doppler of the upper extremity was negative for deep or superficial vein thrombosis. TEE has been requested to rule out endocarditis.     Review of Systems: Review of Systems  Constitutional: Negative for chills and fever.  Cardiovascular: Negative for  chest pain.  Gastrointestinal: Negative for constipation, diarrhea, nausea and vomiting.  Genitourinary: Negative for dysuria, flank pain, frequency, hematuria and urgency.  Skin: Positive for rash.  Neurological: Negative for dizziness, weakness and headaches.    Past Medical History:    Diagnosis Date  . Breast cancer    left breast  . CAD (coronary artery disease)   . Hyperlipidemia   . Hypertension   . Myocardial infarction ([place]) 11/22/04  . Peripheral vascular disease ([place])    atherosclerosis carotid artery-mild    Social History  Substance Use Topics  . Smoking status: Former Smoker    Packs/day: 0.30    Types: Cigarettes    Quit date: 11/09/1983  . Smokeless tobacco: Never Used  . Alcohol use Yes     Comment: socially    Family History  Problem Relation Age of Onset  . Emphysema Mother   . Heart attack Father    No Known Allergies  OBJECTIVE: Blood pressure 136/68, pulse 82, temperature 98.6 F (37 C), temperature source Oral, resp. rate 18, height 5\' 8"  (1.727 m), weight 183 lb 14.4 oz (83.4 kg), SpO2 100 %.  Physical Exam  Constitutional: She is well-developed, well-nourished, and in no distress. No distress.  Cardiovascular: Normal rate, regular rhythm and intact distal pulses.  Exam reveals no gallop and no friction rub.   Murmur heard. Pulmonary/Chest: Effort normal and breath sounds normal. No respiratory distress. She has no wheezes. She has no rales. She exhibits no tenderness.  Skin: Skin is warm and dry. Rash noted.  Rash of the left upper arm with red/purple maculopapular formation.       Lab Results Lab Results  Component Value Date   WBC 6.6 09/24/2017   HGB 10.3 (L) 09/24/2017   HCT 32.5 (L) 09/24/2017   MCV 93.4 09/24/2017   PLT 148 (L) 09/24/2017    Lab Results  Component Value Date   CREATININE 1.01 (H) 09/24/2017   BUN 10 09/24/2017   NA 136 09/24/2017   K 3.6 09/24/2017   CL 107 09/24/2017   CO2 22 09/24/2017    Lab Results  Component Value Date   ALT 21 09/24/2017   AST 25 09/24/2017   ALKPHOS 51 09/24/2017   BILITOT 0.6 09/24/2017     Microbiology: Recent Results (from the past 240 hour(s))  Culture, blood (Routine x 2)     Status: Abnormal   Collection Time: 09/22/17  8:08 PM  Result Value Ref  Range Status   Specimen Description BLOOD RIGHT ANTECUBITAL  Final   Special Requests   Final    BOTTLES DRAWN AEROBIC AND ANAEROBIC Blood Culture adequate volume   Culture  Setup Time   Final    GRAM POSITIVE COCCI IN CHAINS AEROBIC BOTTLE ONLY CRITICAL RESULT CALLED TO, READ BACK BY AND VERIFIED WITH: PHARMD J ORIET [id number] MLM    Culture STREPTOCOCCUS MITIS/ORALIS (A)  Final   Report Status 09/25/2017 FINAL  Final   Organism ID, Bacteria STREPTOCOCCUS MITIS/ORALIS  Final      Susceptibility   Streptococcus mitis/oralis - MIC*    ERYTHROMYCIN 2 RESISTANT Resistant     TETRACYCLINE 0.5 SENSITIVE Sensitive     VANCOMYCIN 0.5 SENSITIVE Sensitive     CLINDAMYCIN <=0.25 SENSITIVE Sensitive     PENICILLIN <=0.06 SENSITIVE Sensitive     CEFTRIAXONE <=0.12 SENSITIVE Sensitive     LEVOFLOXACIN 0.5 SENSITIVE Sensitive     * STREPTOCOCCUS MITIS/ORALIS  Blood Culture ID Panel (Reflexed)  Status: Abnormal   Collection Time: 09/22/17  8:08 PM  Result Value Ref Range Status   Enterococcus species NOT DETECTED NOT DETECTED Final   Listeria monocytogenes NOT DETECTED NOT DETECTED Final   Staphylococcus species NOT DETECTED NOT DETECTED Final   Staphylococcus aureus NOT DETECTED NOT DETECTED Final   Streptococcus species DETECTED (A) NOT DETECTED Final    Comment: Not Enterococcus species, Streptococcus agalactiae, Streptococcus pyogenes, or Streptococcus pneumoniae. CRITICAL RESULT CALLED TO, READ BACK BY AND VERIFIED WITH: PHARMD J ORIET [phone] MLM    Streptococcus agalactiae NOT DETECTED NOT DETECTED Final   Streptococcus pneumoniae NOT DETECTED NOT DETECTED Final   Streptococcus pyogenes NOT DETECTED NOT DETECTED Final   Acinetobacter baumannii NOT DETECTED NOT DETECTED Final   Enterobacteriaceae species NOT DETECTED NOT DETECTED Final   Enterobacter cloacae complex NOT DETECTED NOT DETECTED Final   Escherichia coli NOT DETECTED NOT DETECTED Final   Klebsiella oxytoca NOT  DETECTED NOT DETECTED Final   Klebsiella pneumoniae NOT DETECTED NOT DETECTED Final   Proteus species NOT DETECTED NOT DETECTED Final   Serratia marcescens NOT DETECTED NOT DETECTED Final   Haemophilus influenzae NOT DETECTED NOT DETECTED Final   Neisseria meningitidis NOT DETECTED NOT DETECTED Final   Pseudomonas aeruginosa NOT DETECTED NOT DETECTED Final   Candida albicans NOT DETECTED NOT DETECTED Final   Candida glabrata NOT DETECTED NOT DETECTED Final   Candida krusei NOT DETECTED NOT DETECTED Final   Candida parapsilosis NOT DETECTED NOT DETECTED Final   Candida tropicalis NOT DETECTED NOT DETECTED Final  Culture, blood (Routine x 2)     Status: Abnormal   Collection Time: 09/22/17  8:14 PM  Result Value Ref Range Status   Specimen Description BLOOD RIGHT HAND  Final   Special Requests   Final    BOTTLES DRAWN AEROBIC AND ANAEROBIC Blood Culture results may not be optimal due to an excessive volume of blood received in culture bottles   Culture  Setup Time   Final    GRAM POSITIVE COCCI AEROBIC BOTTLE ONLY CRITICAL VALUE NOTED.  VALUE IS CONSISTENT WITH PREVIOUSLY REPORTED AND CALLED VALUE.    Culture (A)  Final    STREPTOCOCCUS MITIS/ORALIS SUSCEPTIBILITIES PERFORMED ON PREVIOUS CULTURE WITHIN THE LAST 5 DAYS.    Report Status 09/25/2017 FINAL  Final  Urine culture     Status: Abnormal   Collection Time: 09/22/17 10:48 PM  Result Value Ref Range Status   Specimen Description URINE, RANDOM  Final   Special Requests NONE  Final   Culture >=100,000 COLONIES/mL ESCHERICHIA COLI (A)  Final   Report Status 09/25/2017 FINAL  Final   Organism ID, Bacteria ESCHERICHIA COLI (A)  Final      Susceptibility   Escherichia coli - MIC*    AMPICILLIN <=2 SENSITIVE Sensitive     CEFAZOLIN <=4 SENSITIVE Sensitive     CEFTRIAXONE <=1 SENSITIVE Sensitive     CIPROFLOXACIN <=0.25 SENSITIVE Sensitive     GENTAMICIN <=1 SENSITIVE Sensitive     IMIPENEM <=0.25 SENSITIVE Sensitive      NITROFURANTOIN <=16 SENSITIVE Sensitive     TRIMETH/SULFA <=20 SENSITIVE Sensitive     AMPICILLIN/SULBACTAM <=2 SENSITIVE Sensitive     PIP/TAZO <=4 SENSITIVE Sensitive     Extended ESBL NEGATIVE Sensitive     * >=100,000 COLONIES/mL ESCHERICHIA COLI   Terri Piedra, NP [facility] [place] [phone] Pager [phone] Cell   09/25/2017, 2:13 PM

## 2017-09-26 ENCOUNTER — Encounter (HOSPITAL_COMMUNITY): Payer: Self-pay | Admitting: *Deleted

## 2017-09-26 ENCOUNTER — Inpatient Hospital Stay (HOSPITAL_COMMUNITY): Payer: Medicare Other | Admitting: Anesthesiology

## 2017-09-26 ENCOUNTER — Inpatient Hospital Stay (HOSPITAL_COMMUNITY): Payer: Medicare Other

## 2017-09-26 ENCOUNTER — Encounter (HOSPITAL_COMMUNITY)
Admission: EM | Disposition: A | Discharge status: Home / Self Care | Payer: Self-pay | Source: Home / Self Care | Attending: Family Medicine

## 2017-09-26 DIAGNOSIS — I340 Nonrheumatic mitral (valve) insufficiency: Secondary | ICD-10-CM

## 2017-09-26 DIAGNOSIS — R079 Chest pain, unspecified: Secondary | ICD-10-CM

## 2017-09-26 DIAGNOSIS — A46 Erysipelas: Secondary | ICD-10-CM

## 2017-09-26 HISTORY — PX: TEE WITHOUT CARDIOVERSION: SHX5443

## 2017-09-26 LAB — CBC WITH DIFFERENTIAL/PLATELET
Basophils Absolute: 0 10*3/uL (ref 0.0–0.1)
Basophils Relative: 0 %
Eosinophils Absolute: 0.3 10*3/uL (ref 0.0–0.7)
Eosinophils Relative: 7 %
HCT: 30.9 % — ABNORMAL LOW (ref 36.0–46.0)
Hemoglobin: 10.1 g/dL — ABNORMAL LOW (ref 12.0–15.0)
Lymphocytes Relative: 26 %
Lymphs Abs: 1 10*3/uL (ref 0.7–4.0)
MCH: 29.6 pg (ref 26.0–34.0)
MCHC: 32.7 g/dL (ref 30.0–36.0)
MCV: 90.6 fL (ref 78.0–100.0)
Monocytes Absolute: 0.5 10*3/uL (ref 0.1–1.0)
Monocytes Relative: 12 %
Neutro Abs: 2 10*3/uL (ref 1.7–7.7)
Neutrophils Relative %: 55 %
Platelets: 151 10*3/uL (ref 150–400)
RBC: 3.41 MIL/uL — ABNORMAL LOW (ref 3.87–5.11)
RDW: 13.4 % (ref 11.5–15.5)
WBC: 3.7 10*3/uL — ABNORMAL LOW (ref 4.0–10.5)

## 2017-09-26 SURGERY — ECHOCARDIOGRAM, TRANSESOPHAGEAL
Anesthesia: Monitor Anesthesia Care

## 2017-09-26 MED ORDER — SODIUM CHLORIDE 0.9 % IV SOLN: INTRAVENOUS | Status: DC

## 2017-09-26 MED ORDER — AMOXICILLIN 500 MG PO CAPS: 500.0000 mg | ORAL_CAPSULE | Freq: Three times a day (TID) | ORAL | 0 refills | Status: DC

## 2017-09-26 MED ORDER — PROPOFOL 500 MG/50ML IV EMUL
INTRAVENOUS | Status: DC | PRN
  Administered 2017-09-26: 50 ug/kg/min via INTRAVENOUS

## 2017-09-26 MED ORDER — PROPOFOL 10 MG/ML IV BOLUS
INTRAVENOUS | Status: DC | PRN
  Administered 2017-09-26: 20 mg via INTRAVENOUS

## 2017-09-26 MED ORDER — LACTATED RINGERS IV SOLN
INTRAVENOUS | Status: DC | PRN
  Administered 2017-09-26: 08:00:00 via INTRAVENOUS

## 2017-09-26 MED ORDER — BUTAMBEN-TETRACAINE-BENZOCAINE 2-2-14 % EX AERO
INHALATION_SPRAY | CUTANEOUS | Status: DC | PRN
  Administered 2017-09-26: 2 via TOPICAL

## 2017-09-26 NOTE — Progress Notes (Signed)
  Echocardiogram Echocardiogram Transesophageal has been performed.  Jannett Celestine 09/26/2017, 9:02 AM

## 2017-09-26 NOTE — Transfer of Care (Signed)
Immediate Anesthesia Transfer of Care Note  Patient: [name]  Procedure(s) Performed: TRANSESOPHAGEAL ECHOCARDIOGRAM (TEE) (N/A )  Patient Location: Endoscopy Unit  Anesthesia Type:MAC  Level of Consciousness: awake, alert , oriented and patient cooperative  Airway & Oxygen Therapy: Patient Spontanous Breathing and Patient connected to nasal cannula oxygen  Post-op Assessment: Report given to RN and Post -op Vital signs reviewed and stable  Post vital signs: Reviewed and stable  Last Vitals:  Vitals:   09/26/17 0607 09/26/17 0710  BP: 133/60 (!) 161/62  Pulse: 72 69  Resp: 20 12  Temp: 36.6 C 36.4 C  SpO2: 100% 96%    Last Pain:  Vitals:   09/26/17 0710  TempSrc: Oral  PainSc:          Complications: No apparent anesthesia complications

## 2017-09-26 NOTE — CV Procedure (Signed)
   Transesophageal Echocardiogram Note  Wendy [name] [id number] [birth year]  Procedure: Transesophageal Echocardiogram Indications: bacteremia  Procedure Details Consent: Obtained Time Out: Verified patient identification, verified procedure, site/side was marked, verified correct patient position, special equipment/implants available, Radiology Safety Procedures followed,  medications/allergies/relevent history reviewed, required imaging and test results available.  Performed  Medications: Iv lidocain and propofol administered by anesthesia staff.   No endocarditis, see TEE report for full dictation.    Complications: No apparent complications Patient did tolerate procedure well.  Ena Dawley, MD, [facility] 09/26/2017, 9:46 AM

## 2017-09-26 NOTE — H&P (View-Only) (Signed)
PROGRESS NOTE  Wendy [name] INO:[id number] DOB: 03/24/1941 DOA: 09/22/2017 PCP: [facility]  Brief Narrative: 76 year old woman PMH essential hypertension, coronary artery disease, chronic kidney disease, left-sided breast cancer status post mastectomy, presented with fever, left arm erythema, chest pain.  Admitted for sepsis secondary to left arm cellulitis, possible UTI  Assessment/Plan Left upper extremity cellulitis.  Noted history of mastectomy on that side with intermittent swelling. Left upper extremity venous Doppler was negative for DVT. -improving slowly. Continue ceftriaxone. No evidence of complicating features  Streptococcus mitis/oralis bacteremia 2/2 bottles - no recent dental work or bites. Dentition on exam appears good - murmur noted. TEE requested to r/o endocarditis. - ID consulted  Chest pain.  With radiation to left arm, shoulder, neck.  No shortness of breath.  Not pleuritic.  Recently treated for an upper respiratory tract infection with azithromycin. -resolved, troponins flat. No further evaluation. Continue aspirin, Lipitor  E coli UTI, pansensitive. - continue ceftriaxone  Essential hypertension -remains stable. Continue Diovan, hydrochlorothiazide  PMH CAD status post stent.  LDL 42.  PMH left-sided breast cancer, status post mastectomy -Continue anastrozole  DVT prophylaxis: enoxparin Code Status: DNR Family Communication: none  Disposition Plan: home with no PT follow-up    Murray Hodgkins, MD  [facility] Direct contact: [phone] --[place] [place]  --[email or web address]; password [email or web address]  7PM-7AM contact night coverage as above 09/25/2017, 11:42 AM  LOS: 3 days   Consultants:    Procedures:  2-d echo Study Conclusions  - Left ventricle: The cavity size was normal. Wall thickness was   normal. Systolic function was vigorous. The estimated ejection   fraction was in the range of 65% to 70%.  Wall motion was normal;   there were no regional wall motion abnormalities. Peak velocity   through outflow tract was 2.75m/s. No significant outflow tract   obstruction. Doppler parameters are consistent with abnormal left   ventricular relaxation (grade 1 diastolic dysfunction). - Aortic valve: Trileaflet; mildly thickened, mildly calcified   leaflets. - Mitral valve: Calcified annulus. - Tricuspid valve: There was mild regurgitation.  Antimicrobials:  Ceftriaxone 10/30 >>  Vancomycin 10/30 >>  Interval history/Subjective: Feels better. Left arm feels better, less painful, less warm.  Objective: Vitals:   Vitals:   09/25/17 0046 09/25/17 0613  BP: 128/68 136/68  Pulse: 93 82  Resp: 20 18  Temp: 98.2 F (36.8 C) 98.6 F (37 C)  SpO2: 100% 100%     Exam: Constitutional:  . Appears calm and comfortable Eyes:  . pupils and irises appear normal ENMT:  . grossly normal hearing  . Oropharynx: mucosa, tongue, dentition appears normal Respiratory:  . CTA bilaterally, no w/r/r.  . Respiratory effort normal.  Cardiovascular:  . RRR, no m/r/g . No LE extremity edema   Musculoskeletal:  . LUE o strength and tone normal, no atrophy, no abnormal movements o Less tenderness to palpation today\ 2+ radial pulse LUE, perfusion of hand appears intact Skin:  . Decreasing erythema, warm and edema LUE Psychiatric:  . Mental status o Mood, affect appropriate  I have personally reviewed the following:   Labs:  CBG stable   Imaging studies:     Medical tests:  Left upper extremity venous ultrasound negative for DVT.   Scheduled Meds: . anastrozole  1 mg Oral Daily  . aspirin EC  81 mg Oral Daily  . atorvastatin  20 mg Oral q1800  . calcium-vitamin D  1 tablet Oral Q breakfast  . enoxaparin (  LOVENOX) injection  40 mg Subcutaneous Daily  . multivitamin with minerals  1 tablet Oral Daily  . omega-3 acid ethyl esters  1 g Oral Daily   Continuous Infusions: .  cefTRIAXone (ROCEPHIN)  IV Stopped (09/25/17 1101)    Principal Problem:   Streptococcal bacteremia Active Problems:   Malignant neoplasm of upper-outer quadrant of left breast in female, estrogen receptor positive (HCC)   CAD (coronary artery disease)   Essential hypertension   Obesity (BMI 30-39.9)   CKD (chronic kidney disease), stage III ([place])   Lower urinary tract infectious disease   Chest pain   Left arm cellulitis   LOS: 3 days

## 2017-09-26 NOTE — Progress Notes (Signed)
[facility]fusion Coordinator will follow pt with ID team to support home IV ABX at DC if ordered/needed.  If patient discharges after hours, please call [phone].   Larry Sierras 09/26/2017, 11:17 AM

## 2017-09-26 NOTE — Anesthesia Postprocedure Evaluation (Signed)
Anesthesia Post Note  Patient: [name]  Procedure(s) Performed: TRANSESOPHAGEAL ECHOCARDIOGRAM (TEE) (N/A )     Patient location during evaluation: PACU Anesthesia Type: MAC Level of consciousness: awake and alert Pain management: pain level controlled Vital Signs Assessment: post-procedure vital signs reviewed and stable Respiratory status: spontaneous breathing, nonlabored ventilation, respiratory function stable and patient connected to nasal cannula oxygen Cardiovascular status: stable and blood pressure returned to baseline Postop Assessment: no apparent nausea or vomiting Anesthetic complications: no    Last Vitals:  Vitals:   09/26/17 0900 09/26/17 0905  BP:  (!) 143/52  Pulse: 67 69  Resp: 19 15  Temp:    SpO2: 95% 96%    Last Pain:  Vitals:   09/26/17 0710  TempSrc: Oral  PainSc:                  Tiajuana Amass

## 2017-09-26 NOTE — Interval H&P Note (Signed)
History and Physical Interval Note:  09/26/2017 8:21 AM  [name]  has presented today for surgery, with the diagnosis of BACTEREMIA  The various methods of treatment have been discussed with the patient and family. After consideration of risks, benefits and other options for treatment, the patient has consented to  Procedure(s): TRANSESOPHAGEAL ECHOCARDIOGRAM (TEE) (N/A) as a surgical intervention .  The patient's history has been reviewed, patient examined, no change in status, stable for surgery.  I have reviewed the patient's chart and labs.  Questions were answered to the patient's satisfaction.     Ena Dawley

## 2017-09-26 NOTE — Discharge Summary (Signed)
Physician Discharge Summary  Wendy [name] ZOX:[id number] DOB: June 07, 1941 DOA: 09/22/2017  PCP: [name], [place] date: 09/22/2017 Discharge date: 09/26/2017  Recommendations for Outpatient Follow-up:  1. Resolution of erysipelas  Follow-up Information    [name], [facility]. Schedule an appointment as soon as possible for a visit on 10/07/2017.   Specialty:  Family Medicine Why:  At 11:30 AM. Patient needs to be there by 11:15 AM. Contact information: [address] [place] [id number] [phone]            Discharge Diagnoses:  1. Left upper extremity erysipelas 2. Streptococcus mitis/oralis bacteremia 2/2 bottles.  3. Chest pain 4. E coli UTI  Discharge Condition: improved Disposition: home  Diet recommendation: Regular  Filed Weights   09/24/17 0535 09/25/17 0613 09/26/17 0251  Weight: 84.4 kg (186 lb) 83.4 kg (183 lb 14.4 oz) 81.7 kg (180 lb 1.6 oz)    History of present illness:  76 year old woman PMH essential hypertension, coronary artery disease, chronic kidney disease, left-sided breast cancer status post mastectomy, presented with fever, left arm erythema, chest pain.  Admitted for sepsis secondary to left arm cellulitis, possible UTI  Hospital Course:  Patient was treated with IV abx with gradual clinical improvement. BC were positive for Strep and patient was seen by ID. Endocarditis was ruled out by TEE and ID recommended 7 additional days of amoxicillin. Hospitalization was uncomplicated, individual issues as below.  Left upper extremity erysipelas.  Noted history of mastectomy on that side with intermittent swelling. Left upper extremity venous Doppler was negative for DVT. -much improved with ceftriaxone. Change to amoxicillin as per ID for 7 additional days.  Streptococcus mitis/oralis bacteremia 2/2 bottles. No recent dental work or bites. Dentition on exam appeared good -  TEE no endocarditis - amoxicillin as above  Chest pain.  With radiation to left arm, shoulder, neck.  No shortness of breath.  Not pleuritic.  Recently treated for an upper respiratory tract infection with azithromycin. -resolved, troponins flat. No further evaluation. Continue aspirin, Lipitor  E coli UTI, pansensitive. - treated.  Consultants:  ID  Procedures:  2-d echo Study Conclusions  - Left ventricle: The cavity size was normal. Wall thickness was normal. Systolic function was vigorous. The estimated ejection fraction was in the range of 65% to 70%. Wall motion was normal; there were no regional wall motion abnormalities. Peak velocity through outflow tract was 2.41m/s. No significant outflow tract obstruction. Doppler parameters are consistent with abnormal left ventricular relaxation (grade 1 diastolic dysfunction). - Aortic valve: Trileaflet; mildly thickened, mildly calcified leaflets. - Mitral valve: Calcified annulus. - Tricuspid valve: There was mild regurgitation.  Antimicrobials:  Ceftriaxone 10/30 >>  Vancomycin 10/30 >> 11/1  Today's assessment: S: feels better, less pain in arm. O: Vitals:  Vitals:   09/26/17 0905 09/26/17 1156  BP: (!) 143/52 (!) 149/60  Pulse: 69 66  Resp: 15 18  Temp:  97.6 F (36.4 C)  SpO2: 96% 98%    Constitutional:  Appears calm and comfortable Respiratory:  CTA bilaterally, no w/r/r.  Respiratory effort normal.  Cardiovascular:  RRR, no m/r/g Skin:  Less erythema LUE. Psychiatric:  Mental status Mood, affect appropriate   Hgb stable 10.1, plts 151  Discharge Instructions  Discharge Instructions    Activity as tolerated - No restrictions    Complete by:  As directed    Diet - low sodium heart healthy    Complete by:  As directed  Discharge instructions    Complete by:  As directed    Call your physician or seek immediate medical attention for fever, swelling, pain, redness or  worsening of condition.     Allergies as of 09/26/2017   No Known Allergies     Medication List    TAKE these medications   amoxicillin 500 MG capsule Commonly known as:  AMOXIL Take 1 capsule (500 mg total) by mouth 3 (three) times daily.   anastrozole 1 MG tablet Commonly known as:  ARIMIDEX Take 1 tablet (1 mg total) by mouth daily.   aspirin EC 81 MG tablet Take 81 mg by mouth daily.   atorvastatin 40 MG tablet Commonly known as:  LIPITOR Take 20 mg by mouth at bedtime.   CALCIUM PO Take 1 tablet by mouth daily.   multivitamin with minerals Tabs tablet Take 1 tablet by mouth daily.   NITROSTAT 0.4 MG SL tablet Generic drug:  nitroGLYCERIN Place 0.4 mg under the tongue every 5 (five) minutes as needed for chest pain.   omega-3 acid ethyl esters 1 g capsule Commonly known as:  LOVAZA Take 1 g by mouth daily.   valsartan-hydrochlorothiazide 160-12.5 MG tablet Commonly known as:  DIOVAN HCT Take 1 tablet by mouth daily. 1/2 tab twice daily What changed:  how much to take  when to take this  additional instructions      No Known Allergies  The results of significant diagnostics from this hospitalization (including imaging, microbiology, ancillary and laboratory) are listed below for reference.    Significant Diagnostic Studies: Dg Chest 2 View  Result Date: 09/22/2017 CLINICAL DATA:  76 year old female with chest pain. EXAM: CHEST  2 VIEW COMPARISON:  Chest radiograph dated 06/01/2017 FINDINGS: Minimal left lung base atelectatic changes. There is no focal consolidation, pleural effusion, or pneumothorax. The cardiac silhouette is within normal limits. There is mild atherosclerotic calcification of the aortic arch. Surgical clips noted in the left axilla. No acute osseous pathology. IMPRESSION: No active cardiopulmonary disease. Electronically Signed   By: Anner Crete M.D.   On: 09/22/2017 21:11    Microbiology: Recent Results (from the past 240  hour(s))  Culture, blood (Routine x 2)     Status: Abnormal   Collection Time: 09/22/17  8:08 PM  Result Value Ref Range Status   Specimen Description BLOOD RIGHT ANTECUBITAL  Final   Special Requests   Final    BOTTLES DRAWN AEROBIC AND ANAEROBIC Blood Culture adequate volume   Culture  Setup Time   Final    GRAM POSITIVE COCCI IN CHAINS AEROBIC BOTTLE ONLY CRITICAL RESULT CALLED TO, READ BACK BY AND VERIFIED WITH: Alinda Dooms [phone] MLM    Culture STREPTOCOCCUS MITIS/ORALIS (A)  Final   Report Status 09/25/2017 FINAL  Final   Organism ID, Bacteria STREPTOCOCCUS MITIS/ORALIS  Final      Susceptibility   Streptococcus mitis/oralis - MIC*    ERYTHROMYCIN 2 RESISTANT Resistant     TETRACYCLINE 0.5 SENSITIVE Sensitive     VANCOMYCIN 0.5 SENSITIVE Sensitive     CLINDAMYCIN <=0.25 SENSITIVE Sensitive     PENICILLIN <=0.06 SENSITIVE Sensitive     CEFTRIAXONE <=0.12 SENSITIVE Sensitive     LEVOFLOXACIN 0.5 SENSITIVE Sensitive     * STREPTOCOCCUS MITIS/ORALIS  Blood Culture ID Panel (Reflexed)     Status: Abnormal   Collection Time: 09/22/17  8:08 PM  Result Value Ref Range Status   Enterococcus species NOT DETECTED NOT DETECTED Final   Listeria monocytogenes  NOT DETECTED NOT DETECTED Final   Staphylococcus species NOT DETECTED NOT DETECTED Final   Staphylococcus aureus NOT DETECTED NOT DETECTED Final   Streptococcus species DETECTED (A) NOT DETECTED Final    Comment: Not Enterococcus species, Streptococcus agalactiae, Streptococcus pyogenes, or Streptococcus pneumoniae. CRITICAL RESULT CALLED TO, READ BACK BY AND VERIFIED WITH: PHARMD J ORIET [phone] MLM    Streptococcus agalactiae NOT DETECTED NOT DETECTED Final   Streptococcus pneumoniae NOT DETECTED NOT DETECTED Final   Streptococcus pyogenes NOT DETECTED NOT DETECTED Final   Acinetobacter baumannii NOT DETECTED NOT DETECTED Final   Enterobacteriaceae species NOT DETECTED NOT DETECTED Final   Enterobacter cloacae  complex NOT DETECTED NOT DETECTED Final   Escherichia coli NOT DETECTED NOT DETECTED Final   Klebsiella oxytoca NOT DETECTED NOT DETECTED Final   Klebsiella pneumoniae NOT DETECTED NOT DETECTED Final   Proteus species NOT DETECTED NOT DETECTED Final   Serratia marcescens NOT DETECTED NOT DETECTED Final   Haemophilus influenzae NOT DETECTED NOT DETECTED Final   Neisseria meningitidis NOT DETECTED NOT DETECTED Final   Pseudomonas aeruginosa NOT DETECTED NOT DETECTED Final   Candida albicans NOT DETECTED NOT DETECTED Final   Candida glabrata NOT DETECTED NOT DETECTED Final   Candida krusei NOT DETECTED NOT DETECTED Final   Candida parapsilosis NOT DETECTED NOT DETECTED Final   Candida tropicalis NOT DETECTED NOT DETECTED Final  Culture, blood (Routine x 2)     Status: Abnormal   Collection Time: 09/22/17  8:14 PM  Result Value Ref Range Status   Specimen Description BLOOD RIGHT HAND  Final   Special Requests   Final    BOTTLES DRAWN AEROBIC AND ANAEROBIC Blood Culture results may not be optimal due to an excessive volume of blood received in culture bottles   Culture  Setup Time   Final    GRAM POSITIVE COCCI AEROBIC BOTTLE ONLY CRITICAL VALUE NOTED.  VALUE IS CONSISTENT WITH PREVIOUSLY REPORTED AND CALLED VALUE.    Culture (A)  Final    STREPTOCOCCUS MITIS/ORALIS SUSCEPTIBILITIES PERFORMED ON PREVIOUS CULTURE WITHIN THE LAST 5 DAYS.    Report Status 09/25/2017 FINAL  Final  Urine culture     Status: Abnormal   Collection Time: 09/22/17 10:48 PM  Result Value Ref Range Status   Specimen Description URINE, RANDOM  Final   Special Requests NONE  Final   Culture >=100,000 COLONIES/mL ESCHERICHIA COLI (A)  Final   Report Status 09/25/2017 FINAL  Final   Organism ID, Bacteria ESCHERICHIA COLI (A)  Final      Susceptibility   Escherichia coli - MIC*    AMPICILLIN <=2 SENSITIVE Sensitive     CEFAZOLIN <=4 SENSITIVE Sensitive     CEFTRIAXONE <=1 SENSITIVE Sensitive     CIPROFLOXACIN  <=0.25 SENSITIVE Sensitive     GENTAMICIN <=1 SENSITIVE Sensitive     IMIPENEM <=0.25 SENSITIVE Sensitive     NITROFURANTOIN <=16 SENSITIVE Sensitive     TRIMETH/SULFA <=20 SENSITIVE Sensitive     AMPICILLIN/SULBACTAM <=2 SENSITIVE Sensitive     PIP/TAZO <=4 SENSITIVE Sensitive     Extended ESBL NEGATIVE Sensitive     * >=100,000 COLONIES/mL ESCHERICHIA COLI  Culture, blood (routine x 2)     Status: None (Preliminary result)   Collection Time: 09/24/17  5:04 AM  Result Value Ref Range Status   Specimen Description BLOOD RIGHT ARM  Final   Special Requests   Final    BOTTLES DRAWN AEROBIC AND ANAEROBIC Blood Culture adequate volume   Culture NO  GROWTH 2 DAYS  Final   Report Status PENDING  Incomplete  Culture, blood (routine x 2)     Status: None (Preliminary result)   Collection Time: 09/24/17  5:08 AM  Result Value Ref Range Status   Specimen Description BLOOD RIGHT HAND  Final   Special Requests IN PEDIATRIC BOTTLE Blood Culture adequate volume  Final   Culture NO GROWTH 2 DAYS  Final   Report Status PENDING  Incomplete     Labs: Basic Metabolic Panel:  Recent Labs Lab 09/22/17 2003 09/23/17 0828 09/24/17 0503  NA 137 138 136  K 3.7 3.6 3.6  CL 103 109 107  CO2 22 21* 22  GLUCOSE 124* 111* 112*  BUN 22* 16 10  CREATININE 1.25* 0.98 1.01*  CALCIUM 9.2 7.8* 8.3*   Liver Function Tests:  Recent Labs Lab 09/22/17 2003 09/24/17 0503  AST 37 25  ALT 31 21  ALKPHOS 66 51  BILITOT 0.6 0.6  PROT 6.6 5.5*  ALBUMIN 3.9 3.0*   CBC:  Recent Labs Lab 09/22/17 2003 09/23/17 0647 09/23/17 0828 09/24/17 0503 09/26/17 0420  WBC 10.1 12.9* 11.2* 6.6 3.7*  NEUTROABS 9.0*  --   --   --  2.0  HGB 12.5 10.4* 9.8* 10.3* 10.1*  HCT 37.7 32.5* 30.5* 32.5* 30.9*  MCV 92.2 92.9 92.1 93.4 90.6  PLT 185 170 153 148* 151   Cardiac Enzymes:  Recent Labs Lab 09/23/17 0046 09/23/17 0828 09/23/17 1025  TROPONINI 0.04* 0.03* 0.03*   CBG:  Recent Labs Lab  09/23/17 0815 09/24/17 0610  GLUCAP 115* 104*    Principal Problem:   Streptococcal bacteremia Active Problems:   Malignant neoplasm of upper-outer quadrant of left breast in female, estrogen receptor positive (HCC)   CAD (coronary artery disease)   Essential hypertension   Obesity (BMI 30-39.9)   CKD (chronic kidney disease), stage III ([place])   Lower urinary tract infectious disease   Chest pain   Left arm cellulitis   Time coordinating discharge: 35 minutes  Signed:  Murray Hodgkins, MD [facility] 09/26/2017, 3:48 PM

## 2017-09-26 NOTE — Progress Notes (Addendum)
[facility] for Infectious Disease  Date of Admission:  09/22/2017   Total days of antibiotics 4  Ceftriaxone 10/29 >> d'c at discharge Vancomycin 10/30 >> 10/31    ASSESSMENT: 76 y/o female with PMH of hypertension, CAD, CKD, and left-sided breast cancer post mastectomy with streptococcal bacteremia with no evidence of endocarditis on TEE.  Also noted to have cellulitis/erysipelas rash located on the left arm which is continuing to improve.  Current antibiotic is ceftriaxone.  Has remained afebrile with repeat blood cultures on 10/31 with no growth to date.  PLAN:  Streptococcal bacteremia - No endocarditis. Change ceftriaxone to amoxicillin 500 mg tid x 7 days at discharge and follow up with primary care provider as needed.   Cellulitis/Erysipelas - Continues to improve and will be well covered with amoxicillin.   ADDENDUM: I agree with plan as above.  Amoxicillin for 7 more days for cellulitis. No endocarditis.   We are available if needed.   Scharlene Gloss, MD    Principal Problem:   Streptococcal bacteremia Active Problems:   Malignant neoplasm of upper-outer quadrant of left breast in female, estrogen receptor positive ([place])   CAD (coronary artery disease)   Essential hypertension   Obesity (BMI 30-39.9)   CKD (chronic kidney disease), stage III (HCC)   Lower urinary tract infectious disease   Chest pain   Left arm cellulitis   Scheduled Meds: . anastrozole  1 mg Oral Daily  . aspirin EC  81 mg Oral Daily  . atorvastatin  20 mg Oral q1800  . calcium-vitamin D  1 tablet Oral Q breakfast  . enoxaparin (LOVENOX) injection  40 mg Subcutaneous Daily  . multivitamin with minerals  1 tablet Oral Daily  . omega-3 acid ethyl esters  1 g Oral Daily   Continuous Infusions: . cefTRIAXone (ROCEPHIN)  IV 2 g (09/26/17 0800)   PRN Meds:.acetaminophen **OR** acetaminophen, hydrALAZINE, nitroGLYCERIN, ondansetron **OR** ondansetron (ZOFRAN) IV,  oxyCODONE-acetaminophen, zolpidem   SUBJECTIVE: No complaints overnight with no fevers, chills, or signs of infection.  Left arm improved with decreased redness and warmth. No urinary symptoms.  Hospital course: Blood cultures from 10/31 with no growth to date.  Afebrile overnight.  Review of Systems: Review of Systems  Constitutional: Negative for chills and fever.  Cardiovascular: Negative for chest pain.  Genitourinary: Negative for dysuria, flank pain, frequency, hematuria and urgency.  Skin: Positive for rash (Left arm).  Neurological: Negative for weakness.    No Known Allergies  OBJECTIVE: Vitals:   09/26/17 0710 09/26/17 0856 09/26/17 0900 09/26/17 0905  BP: (!) 161/62 (!) 122/51  (!) 143/52  Pulse: 69 69 67 69  Resp: 12 15 19 15   Temp: 97.6 F (36.4 C)     TempSrc: Oral     SpO2: 96% 98% 95% 96%  Weight:      Height:       Body mass index is 27.38 kg/m.  Physical Exam  Constitutional: She is oriented to person, place, and time and well-developed, well-nourished, and in no distress. No distress.  Cardiovascular: Normal rate, regular rhythm and intact distal pulses.  Exam reveals no gallop and no friction rub.   Murmur heard. Pulmonary/Chest: Effort normal and breath sounds normal. No respiratory distress. She has no wheezes. She has no rales. She exhibits no tenderness.  Abdominal: Soft. Bowel sounds are normal. She exhibits no distension and no mass. There is no tenderness. There is no rebound and no guarding.  Neurological: She is alert  and oriented to person, place, and time.  Skin: Skin is warm and dry.  Left arm with improved appearance and no tenderness.   Psychiatric: Mood, memory, affect and judgment normal.    Lab Results Lab Results  Component Value Date   WBC 3.7 (L) 09/26/2017   HGB 10.1 (L) 09/26/2017   HCT 30.9 (L) 09/26/2017   MCV 90.6 09/26/2017   PLT 151 09/26/2017    Lab Results  Component Value Date   CREATININE 1.01 (H) 09/24/2017    BUN 10 09/24/2017   NA 136 09/24/2017   K 3.6 09/24/2017   CL 107 09/24/2017   CO2 22 09/24/2017    Lab Results  Component Value Date   ALT 21 09/24/2017   AST 25 09/24/2017   ALKPHOS 51 09/24/2017   BILITOT 0.6 09/24/2017     Microbiology: Recent Results (from the past 240 hour(s))  Culture, blood (Routine x 2)     Status: Abnormal   Collection Time: 09/22/17  8:08 PM  Result Value Ref Range Status   Specimen Description BLOOD RIGHT ANTECUBITAL  Final   Special Requests   Final    BOTTLES DRAWN AEROBIC AND ANAEROBIC Blood Culture adequate volume   Culture  Setup Time   Final    GRAM POSITIVE COCCI IN CHAINS AEROBIC BOTTLE ONLY CRITICAL RESULT CALLED TO, READ BACK BY AND VERIFIED WITH: PHARMD J ORIET [phone] MLM    Culture STREPTOCOCCUS MITIS/ORALIS (A)  Final   Report Status 09/25/2017 FINAL  Final   Organism ID, Bacteria STREPTOCOCCUS MITIS/ORALIS  Final      Susceptibility   Streptococcus mitis/oralis - MIC*    ERYTHROMYCIN 2 RESISTANT Resistant     TETRACYCLINE 0.5 SENSITIVE Sensitive     VANCOMYCIN 0.5 SENSITIVE Sensitive     CLINDAMYCIN <=0.25 SENSITIVE Sensitive     PENICILLIN <=0.06 SENSITIVE Sensitive     CEFTRIAXONE <=0.12 SENSITIVE Sensitive     LEVOFLOXACIN 0.5 SENSITIVE Sensitive     * STREPTOCOCCUS MITIS/ORALIS  Blood Culture ID Panel (Reflexed)     Status: Abnormal   Collection Time: 09/22/17  8:08 PM  Result Value Ref Range Status   Enterococcus species NOT DETECTED NOT DETECTED Final   Listeria monocytogenes NOT DETECTED NOT DETECTED Final   Staphylococcus species NOT DETECTED NOT DETECTED Final   Staphylococcus aureus NOT DETECTED NOT DETECTED Final   Streptococcus species DETECTED (A) NOT DETECTED Final    Comment: Not Enterococcus species, Streptococcus agalactiae, Streptococcus pyogenes, or Streptococcus pneumoniae. CRITICAL RESULT CALLED TO, READ BACK BY AND VERIFIED WITH: PHARMD J ORIET [phone] MLM    Streptococcus agalactiae NOT  DETECTED NOT DETECTED Final   Streptococcus pneumoniae NOT DETECTED NOT DETECTED Final   Streptococcus pyogenes NOT DETECTED NOT DETECTED Final   Acinetobacter baumannii NOT DETECTED NOT DETECTED Final   Enterobacteriaceae species NOT DETECTED NOT DETECTED Final   Enterobacter cloacae complex NOT DETECTED NOT DETECTED Final   Escherichia coli NOT DETECTED NOT DETECTED Final   Klebsiella oxytoca NOT DETECTED NOT DETECTED Final   Klebsiella pneumoniae NOT DETECTED NOT DETECTED Final   Proteus species NOT DETECTED NOT DETECTED Final   Serratia marcescens NOT DETECTED NOT DETECTED Final   Haemophilus influenzae NOT DETECTED NOT DETECTED Final   Neisseria meningitidis NOT DETECTED NOT DETECTED Final   Pseudomonas aeruginosa NOT DETECTED NOT DETECTED Final   Candida albicans NOT DETECTED NOT DETECTED Final   Candida glabrata NOT DETECTED NOT DETECTED Final   Candida krusei NOT DETECTED NOT DETECTED Final  Candida parapsilosis NOT DETECTED NOT DETECTED Final   Candida tropicalis NOT DETECTED NOT DETECTED Final  Culture, blood (Routine x 2)     Status: Abnormal   Collection Time: 09/22/17  8:14 PM  Result Value Ref Range Status   Specimen Description BLOOD RIGHT HAND  Final   Special Requests   Final    BOTTLES DRAWN AEROBIC AND ANAEROBIC Blood Culture results may not be optimal due to an excessive volume of blood received in culture bottles   Culture  Setup Time   Final    GRAM POSITIVE COCCI AEROBIC BOTTLE ONLY CRITICAL VALUE NOTED.  VALUE IS CONSISTENT WITH PREVIOUSLY REPORTED AND CALLED VALUE.    Culture (A)  Final    STREPTOCOCCUS MITIS/ORALIS SUSCEPTIBILITIES PERFORMED ON PREVIOUS CULTURE WITHIN THE LAST 5 DAYS.    Report Status 09/25/2017 FINAL  Final  Urine culture     Status: Abnormal   Collection Time: 09/22/17 10:48 PM  Result Value Ref Range Status   Specimen Description URINE, RANDOM  Final   Special Requests NONE  Final   Culture >=100,000 COLONIES/mL ESCHERICHIA COLI  (A)  Final   Report Status 09/25/2017 FINAL  Final   Organism ID, Bacteria ESCHERICHIA COLI (A)  Final      Susceptibility   Escherichia coli - MIC*    AMPICILLIN <=2 SENSITIVE Sensitive     CEFAZOLIN <=4 SENSITIVE Sensitive     CEFTRIAXONE <=1 SENSITIVE Sensitive     CIPROFLOXACIN <=0.25 SENSITIVE Sensitive     GENTAMICIN <=1 SENSITIVE Sensitive     IMIPENEM <=0.25 SENSITIVE Sensitive     NITROFURANTOIN <=16 SENSITIVE Sensitive     TRIMETH/SULFA <=20 SENSITIVE Sensitive     AMPICILLIN/SULBACTAM <=2 SENSITIVE Sensitive     PIP/TAZO <=4 SENSITIVE Sensitive     Extended ESBL NEGATIVE Sensitive     * >=100,000 COLONIES/mL ESCHERICHIA COLI  Culture, blood (routine x 2)     Status: None (Preliminary result)   Collection Time: 09/24/17  5:04 AM  Result Value Ref Range Status   Specimen Description BLOOD RIGHT ARM  Final   Special Requests   Final    BOTTLES DRAWN AEROBIC AND ANAEROBIC Blood Culture adequate volume   Culture NO GROWTH 1 DAY  Final   Report Status PENDING  Incomplete  Culture, blood (routine x 2)     Status: None (Preliminary result)   Collection Time: 09/24/17  5:08 AM  Result Value Ref Range Status   Specimen Description BLOOD RIGHT HAND  Final   Special Requests IN PEDIATRIC BOTTLE Blood Culture adequate volume  Final   Culture NO GROWTH 1 DAY  Final   Report Status PENDING  Incomplete   Terri Piedra, NP [facility] [place] Pager [phone] Cell   09/26/2017,

## 2017-09-26 NOTE — Care Management Note (Signed)
Case Management Note  Patient Details  Name: Wendy [name] MRN: [id number] Date of Birth: 1941-09-04  Subjective/Objective:   Sepsis, Cellulitis                Action/Plan: Patient lives at home, works as a Surveyor, minerals for [employer]; independent of her ADL's, drives a vehicle; PCP is Dr Mechele Dawley with : [facility] [place]; has private insurance with Medicare; pharmacy of choice is [facility]; No needs identified at this time; CM will continue to follow for DCP.  Expected Discharge Date:  Possibly 09/28/2017            Expected Discharge Plan:  Home/Self Care  Discharge planning Services  CM Consult  Status of Service:  In process, will continue to follow  Sherrilyn Rist [phone] 09/26/2017, 10:48 AM

## 2017-09-28 ENCOUNTER — Encounter (HOSPITAL_COMMUNITY): Payer: Self-pay | Admitting: Cardiology

## 2017-09-29 LAB — CULTURE, BLOOD (ROUTINE X 2)
Culture: NO GROWTH
Culture: NO GROWTH
Special Requests: ADEQUATE
Special Requests: ADEQUATE

## 2017-10-01 NOTE — CV Procedure (Signed)
     Transesophageal Echocardiogram Note  CLEOTILDE SPADACCINI [id number] 07-21-1941  Procedure: Transesophageal Echocardiogram Indications: Bacteremia.  Procedure Details Consent: Obtained Time Out: Verified patient identification, verified procedure, site/side was marked, verified correct patient position, special equipment/implants available, Radiology Safety Procedures followed,  medications/allergies/relevent history reviewed, required imaging and test results available.  Performed  No evidence for endocarditis, see TEE report for full dictation.  Medications: IV propofol and lidocaine administered by anesthesia staff.  Complications: No apparent complications Patient did tolerate procedure well.  Ena Dawley, MD, [facility] 10/01/2017, 12:11 PM

## 2017-10-07 DIAGNOSIS — Z09 Encounter for follow-up examination after completed treatment for conditions other than malignant neoplasm: Secondary | ICD-10-CM | POA: Diagnosis not present

## 2017-10-07 DIAGNOSIS — L03114 Cellulitis of left upper limb: Secondary | ICD-10-CM | POA: Diagnosis not present

## 2017-10-07 DIAGNOSIS — N3001 Acute cystitis with hematuria: Secondary | ICD-10-CM | POA: Diagnosis not present

## 2017-10-07 DIAGNOSIS — D649 Anemia, unspecified: Secondary | ICD-10-CM | POA: Diagnosis not present

## 2017-10-24 ENCOUNTER — Other Ambulatory Visit: Payer: Self-pay | Admitting: Hematology and Oncology

## 2017-10-24 DIAGNOSIS — Z1231 Encounter for screening mammogram for malignant neoplasm of breast: Secondary | ICD-10-CM

## 2017-11-19 ENCOUNTER — Other Ambulatory Visit: Payer: Self-pay | Admitting: *Deleted

## 2017-11-19 MED ORDER — ANASTROZOLE 1 MG PO TABS: 1.0000 mg | ORAL_TABLET | Freq: Every day | ORAL | 3 refills | Status: DC

## 2017-11-26 DIAGNOSIS — J209 Acute bronchitis, unspecified: Secondary | ICD-10-CM | POA: Diagnosis not present

## 2017-11-27 ENCOUNTER — Ambulatory Visit (HOSPITAL_COMMUNITY)
Admission: RE | Admit: 2017-11-27 | Discharge: 2017-11-27 | Disposition: A | Discharge status: Home / Self Care | Payer: Medicare Other | Source: Ambulatory Visit | Attending: Hematology and Oncology | Admitting: Hematology and Oncology

## 2017-11-27 ENCOUNTER — Encounter (HOSPITAL_COMMUNITY): Payer: Self-pay

## 2017-11-27 ENCOUNTER — Other Ambulatory Visit: Payer: Self-pay | Admitting: Hematology and Oncology

## 2017-11-27 DIAGNOSIS — Z1231 Encounter for screening mammogram for malignant neoplasm of breast: Secondary | ICD-10-CM | POA: Diagnosis not present

## 2017-12-08 DIAGNOSIS — K529 Noninfective gastroenteritis and colitis, unspecified: Secondary | ICD-10-CM | POA: Diagnosis not present

## 2017-12-08 DIAGNOSIS — M79604 Pain in right leg: Secondary | ICD-10-CM | POA: Diagnosis not present

## 2017-12-08 DIAGNOSIS — R112 Nausea with vomiting, unspecified: Secondary | ICD-10-CM | POA: Diagnosis not present

## 2017-12-21 ENCOUNTER — Other Ambulatory Visit: Payer: Self-pay

## 2017-12-21 ENCOUNTER — Emergency Department (HOSPITAL_COMMUNITY)
Admission: EM | Admit: 2017-12-21 | Discharge: 2017-12-21 | Disposition: A | Discharge status: Home / Self Care | Payer: Medicare Other | Attending: Emergency Medicine | Admitting: Emergency Medicine

## 2017-12-21 ENCOUNTER — Emergency Department (HOSPITAL_COMMUNITY): Payer: Medicare Other

## 2017-12-21 ENCOUNTER — Encounter (HOSPITAL_COMMUNITY): Payer: Self-pay

## 2017-12-21 DIAGNOSIS — N183 Chronic kidney disease, stage 3 (moderate): Secondary | ICD-10-CM | POA: Diagnosis not present

## 2017-12-21 DIAGNOSIS — J90 Pleural effusion, not elsewhere classified: Secondary | ICD-10-CM | POA: Insufficient documentation

## 2017-12-21 DIAGNOSIS — M546 Pain in thoracic spine: Secondary | ICD-10-CM | POA: Insufficient documentation

## 2017-12-21 DIAGNOSIS — I252 Old myocardial infarction: Secondary | ICD-10-CM | POA: Diagnosis not present

## 2017-12-21 DIAGNOSIS — Z955 Presence of coronary angioplasty implant and graft: Secondary | ICD-10-CM | POA: Insufficient documentation

## 2017-12-21 DIAGNOSIS — R079 Chest pain, unspecified: Secondary | ICD-10-CM | POA: Diagnosis not present

## 2017-12-21 DIAGNOSIS — I129 Hypertensive chronic kidney disease with stage 1 through stage 4 chronic kidney disease, or unspecified chronic kidney disease: Secondary | ICD-10-CM | POA: Diagnosis not present

## 2017-12-21 DIAGNOSIS — Z79899 Other long term (current) drug therapy: Secondary | ICD-10-CM | POA: Diagnosis not present

## 2017-12-21 DIAGNOSIS — R0602 Shortness of breath: Secondary | ICD-10-CM | POA: Diagnosis not present

## 2017-12-21 DIAGNOSIS — R5383 Other fatigue: Secondary | ICD-10-CM | POA: Diagnosis not present

## 2017-12-21 DIAGNOSIS — Z7982 Long term (current) use of aspirin: Secondary | ICD-10-CM | POA: Diagnosis not present

## 2017-12-21 DIAGNOSIS — Z87891 Personal history of nicotine dependence: Secondary | ICD-10-CM | POA: Diagnosis not present

## 2017-12-21 DIAGNOSIS — R0789 Other chest pain: Secondary | ICD-10-CM | POA: Diagnosis present

## 2017-12-21 DIAGNOSIS — I2510 Atherosclerotic heart disease of native coronary artery without angina pectoris: Secondary | ICD-10-CM | POA: Insufficient documentation

## 2017-12-21 DIAGNOSIS — R918 Other nonspecific abnormal finding of lung field: Secondary | ICD-10-CM | POA: Diagnosis not present

## 2017-12-21 DIAGNOSIS — Z853 Personal history of malignant neoplasm of breast: Secondary | ICD-10-CM | POA: Diagnosis not present

## 2017-12-21 LAB — TROPONIN I: Troponin I: <0.03 ng/mL (ref <0.03)

## 2017-12-21 LAB — BRAIN NATRIURETIC PEPTIDE: B Natriuretic Peptide: 130 pg/mL — ABNORMAL HIGH (ref 0.0–100.0)

## 2017-12-21 LAB — CBC
HCT: 34.5 % — ABNORMAL LOW (ref 36.0–46.0)
Hemoglobin: 11 g/dL — ABNORMAL LOW (ref 12.0–15.0)
MCH: 28.1 pg (ref 26.0–34.0)
MCHC: 31.9 g/dL (ref 30.0–36.0)
MCV: 88.2 fL (ref 78.0–100.0)
Platelets: 291 10*3/uL (ref 150–400)
RBC: 3.91 MIL/uL (ref 3.87–5.11)
RDW: 15.7 % — ABNORMAL HIGH (ref 11.5–15.5)
WBC: 5.2 10*3/uL (ref 4.0–10.5)

## 2017-12-21 LAB — BASIC METABOLIC PANEL
Anion gap: 11 (ref 5–15)
BUN: 21 mg/dL — ABNORMAL HIGH (ref 6–20)
CO2: 25 mmol/L (ref 22–32)
Calcium: 9.7 mg/dL (ref 8.9–10.3)
Chloride: 105 mmol/L (ref 101–111)
Creatinine, Ser: 1.11 mg/dL — ABNORMAL HIGH (ref 0.44–1.00)
GFR calc Af Amer: 54 mL/min — ABNORMAL LOW (ref >60)
GFR calc non Af Amer: 47 mL/min — ABNORMAL LOW (ref >60)
Glucose, Bld: 114 mg/dL — ABNORMAL HIGH (ref 65–99)
Potassium: 3.5 mmol/L (ref 3.5–5.1)
Sodium: 141 mmol/L (ref 135–145)

## 2017-12-21 MED ORDER — FENTANYL CITRATE (PF) 100 MCG/2ML IJ SOLN
50.0000 ug | Freq: Once | INTRAMUSCULAR | Status: AC
  Administered 2017-12-21: 50 ug via INTRAVENOUS
  Filled 2017-12-21: qty 2

## 2017-12-21 MED ORDER — IOPAMIDOL (ISOVUE-300) INJECTION 61%
75.0000 mL | Freq: Once | INTRAVENOUS | Status: AC | PRN
  Administered 2017-12-21: 75 mL via INTRAVENOUS

## 2017-12-21 MED ORDER — OXYCODONE-ACETAMINOPHEN 5-325 MG PO TABS: 2.0000 | ORAL_TABLET | ORAL | 0 refills | Status: DC | PRN

## 2017-12-21 MED ORDER — OXYCODONE-ACETAMINOPHEN 5-325 MG PO TABS: 1.0000 | ORAL_TABLET | Freq: Three times a day (TID) | ORAL | 0 refills | Status: DC | PRN

## 2017-12-21 NOTE — ED Triage Notes (Signed)
Reports of right sided chest pain as well as pain in left shoulder that radiates left down arm into back. Pain started after working last night. Reports of shortness of breath while ambulating. Took tylenol at 1230 with no relief.

## 2017-12-21 NOTE — Discharge Instructions (Signed)
Try 500mg  of tylenol for your pain. If this does not work then add on 400 mg of Ibuprofen. If this combination doesn't work within an hour then take a percocet (oxycodone). You can do this up to 4 times a day.  Do not take more than 3000 mg of tylenol or ibuprofen in a day.

## 2017-12-21 NOTE — ED Provider Notes (Signed)
Emergency Department Provider Note   I have reviewed the triage vital signs and the nursing notes.   HISTORY  Chief Complaint Chest Pain   HPI Wendy [name] is a 77 y.o. female with a history of breast cancer status post mastectomy and no metastasis that she knows of, coronary disease, hyperlipidemia, hypertension and peripheral vascular disease who presents to the emergency department today secondary to left-sided thorax pain.  Patient states that she has had this pain for going on a couple months now comes and goes but it is sometimes sharp in nature and radiates up her left side of her back to her left shoulder.  It became more persistent after working last night.  She also has worsening shortness of breath and fatigue with ambulation.  She states she has been fatigued for the last month or 2 with progressive worsening but this seems to be a lot worse with ambulation. No other associated or modifying symptoms.    Past Medical History:  Diagnosis Date  . Breast cancer    left breast  . CAD (coronary artery disease)   . Hyperlipidemia   . Hypertension   . Myocardial infarction (W[name]) 11/22/04  . Peripheral vascular disease ([name])    atherosclerosis carotid artery-mild    Patient Active Problem List   Diagnosis Date Noted  . Streptococcal bacteremia 09/24/2017  . CKD (chronic kidney disease), stage III ([facility]y [name]) 09/22/2017  . Sepsis ([place]) 09/22/2017  . Lower urinary tract infectious disease 09/22/2017  . Chest pain 09/22/2017  . Left arm cellulitis 09/22/2017  . Obesity (BMI 30-39.9) 01/06/2014  . Hyperlipidemia 01/06/2014  . CAD (coronary artery disease)   . Essential hypertension   . Carotid artery disease ([name])   . Malignant neoplasm of upper-outer quadrant of left breast in female, estrogen receptor positive ([name]) 10/29/2013    Past Surgical History:  Procedure Laterality Date  . ABDOMINAL HYSTERECTOMY  1982  . KNEE ARTHROSCOPY     knee  . MASTECTOMY MODIFIED  RADICAL Left 11/12/2013   Procedure: MASTECTOMY MODIFIED RADICAL;  Surgeon: Wendy Bookbinder, MD;  Location: [facility];  Service: General;  Laterality: Left;  . 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 PERCUTANEOUS CORONARY STENT INTERVENTION (PCI-S)  2005  . PORTACATH PLACEMENT Right 12/16/2013   Procedure: INSERTION PORT-A-CATH;  Surgeon: Wendy Bookbinder, MD;  Location: [facility];  Service: General;  Laterality: Right;  . TEE WITHOUT CARDIOVERSION N/A 09/26/2017   Procedure: TRANSESOPHAGEAL ECHOCARDIOGRAM (TEE);  Surgeon: Wendy Spark, MD;  Location: [name];  Service: Cardiovascular;  Laterality: N/A;    Current Outpatient Rx  . Order #: [id number] Class: Normal  . Order #: [id number] Class: Normal  . Order #: [id number] Class: Historical Med  . Order #: [id number] Class: Historical Med  . Order #: [id number] Class: Historical Med  . Order #: [id number] Class: Historical Med  . Order #: [id number] Class: Historical Med  . Order #: [id number] Class: Historical Med  . Order #: [id number] Class: Print  . Order #: [id number] Class: Print  . Order #: [id number] Class: No Print    Allergies Patient has no known allergies.  Family History  Problem Relation Age of Onset  . Emphysema Mother   . Heart attack Father     Social History Social History   Tobacco Use  . Smoking status: Former Smoker    Packs/day: 0.30    Types: Cigarettes    Last attempt to quit: 11/09/1983    Years since quitting: 34.1  . Smokeless tobacco: Never Used  Substance Use Topics  . Alcohol use: Yes    Comment: socially  . Drug use: No    Review of Systems  All other systems negative except as documented in the HPI. All pertinent positives and negatives as reviewed in the HPI. ____________________________________________   PHYSICAL EXAM:  VITAL SIGNS: ED Triage Vitals  Enc Vitals Group     BP 12/21/17 1659 120/70     Pulse Rate 12/21/17 1659 88     Resp 12/21/17 1659 15     Temp 12/21/17  1659 98.3 F (36.8 C)     Temp Source 12/21/17 1659 Oral     SpO2 12/21/17 1659 95 %     Weight 12/21/17 1659 166 lb (75.3 kg)     Height 12/21/17 1659 5\' 8"  (1.727 m)    Constitutional: Alert and oriented. Well appearing and in no acute distress. Eyes: Conjunctivae are normal. PERRL. EOMI. Head: Atraumatic. Nose: No congestion/rhinnorhea. Mouth/Throat: Mucous membranes are moist.  Oropharynx non-erythematous. Neck: No stridor.  No meningeal signs.   Cardiovascular: Normal rate, regular rhythm. Good peripheral circulation. Grossly normal heart sounds.   Respiratory: Normal respiratory effort.  No retractions. Lungs with diminished/absent breath sounds in left base, otherwise clear. Gastrointestinal: Soft and nontender. No distention.  Musculoskeletal: No lower extremity tenderness nor edema. No gross deformities of extremities. Neurologic:  Normal speech and language. No gross focal neurologic deficits are appreciated.  Skin:  Skin is warm, dry and intact. No rash noted.   ____________________________________________   LABS (all labs ordered are listed, but only abnormal results are displayed)  Labs Reviewed  BASIC METABOLIC PANEL - Abnormal; Notable for the following components:      Result Value   Glucose, Bld 114 (*)    BUN 21 (*)    Creatinine, Ser 1.11 (*)    GFR calc non Af Amer 47 (*)    GFR calc Af Amer 54 (*)    All other components within normal limits  CBC - Abnormal; Notable for the following components:   Hemoglobin 11.0 (*)    HCT 34.5 (*)    RDW 15.7 (*)    All other components within normal limits  BRAIN NATRIURETIC PEPTIDE - Abnormal; Notable for the following components:   B Natriuretic Peptide 130.0 (*)    All other components within normal limits  TROPONIN I   ____________________________________________  EKG   EKG Interpretation  Date/Time:  Sunday December 21 2017 17:01:33 EST Ventricular Rate:  92 PR Interval:  146 QRS Duration: 96 QT  Interval:  362 QTC Calculation: 447 R Axis:   31 Text Interpretation:  Normal sinus rhythm Normal ECG improved TWI in multiple leads compared to october Confirmed by Merrily Pew [phone]) on 12/21/2017 5:54:01 PM       ____________________________________________  RADIOLOGY  Dg Chest 2 View  Result Date: 12/21/2017 CLINICAL DATA:  Left chest pain for 1 day. EXAM: CHEST  2 VIEW COMPARISON:  PA and lateral chest 09/22/2017 and 06/01/2017. FINDINGS: The patient has a new small left pleural effusion and left basilar airspace opacity. The right lung is clear. No right effusion. The patient is status post left mastectomy and axillary dissection. Heart size is normal. No acute bony abnormality. IMPRESSION: Left pleural effusion and basilar airspace disease could be due to pneumonia but the appearance is atypical. Recommend chest CT with contrast for further evaluation. Electronically Signed   By: Inge Rise M.D.   On: 12/21/2017 17:35   Ct Chest W Contrast  Result Date: 12/21/2017 CLINICAL DATA:  Patient with right-sided chest pain. EXAM: CT CHEST WITH CONTRAST TECHNIQUE: Multidetector CT imaging of the chest was performed during intravenous contrast administration. CONTRAST:  11mL ISOVUE-300 IOPAMIDOL (ISOVUE-300) INJECTION 61% COMPARISON:  Chest radiograph 12/21/2017; chest radiograph 09/22/2017; PET-CT 12/20/2013. FINDINGS: Cardiovascular: Normal heart size. Trace pericardial fluid. Normal caliber thoracic aorta. Peripheral calcified atherosclerotic plaque. Mediastinum/Nodes: Postsurgical changes left axilla. Interval development of mediastinal and hilar adenopathy including a 2.1 cm right paratracheal lymph node (image 49; series 2), a 1.3 cm subcarinal lymph node (image 71; series 2), a 1.0 cm right hilar lymph node (image 77; series 2) and a 1.2 cm left hilar lymph node (image 77; series 2). Normal appearance of the esophagus. Lungs/Pleura: Central airways are patent. There is a new 2.2 x 1.2  cm nodule within the right upper lobe. Additionally more peripherally within the right upper lobe there is a new 1.0 cm nodule (image 57; series 4). Stable 5 mm right lower lobe nodule (image 72; series 4). Interval development of a 1.8 x 1.6 cm irregular left lower lobe nodule (image 90; series 4). Additionally there is a large area of irregular subpleural consolidation measuring 3.4 x 1.9 cm within the left upper lobe (image 71; series 4). Subpleural irregular consolidative opacity within the left lower lobe (image 109; series 2). New moderate left pleural effusion. Small subtle enhancing nodularity demonstrated along the pleural space. Upper Abdomen: Multiple low-attenuation lesions are demonstrated throughout the liver measuring up to 7.0 x 2.6 cm within the right hepatic lobe (image 135; series 2). Musculoskeletal: Multiple lytic lesions are demonstrated throughout the thoracic spine. Reference 2.0 cm lesion within the right aspect of the posterior T11 vertebral body extending into the pedicle. Lytic lesion measuring 1.4 cm within the left aspect of the T5 vertebral body. 1.4 cm lytic lesion within the left aspect of the T2 vertebral body. Patient status post left mastectomy. IMPRESSION: 1. There is a new moderate left pleural effusion with enhancing nodularity within the pleural space, concerning for malignant effusion. 2. Interval development of irregular nodules within the lungs bilaterally, multiple low-attenuation lesions within the liver, multiple lytic lesions within the thoracic spine and mediastinal/hilar adenopathy, most compatible with metastatic disease. 3. Irregular subpleural opacity within the anterior left upper lobe may represent post radiation changes or metastatic disease. 4. Aortic Atherosclerosis (ICD10-I70.0). Electronically Signed   By: Lovey Newcomer M.D.   On: 12/21/2017 20:46    ____________________________________________   PROCEDURES  Procedure(s) performed:    Procedures   ____________________________________________   INITIAL IMPRESSION / ASSESSMENT AND PLAN / ED COURSE  Worsening fatigue, shortness of breath, pain and new pleural effusion with an atypical consolidation in left lower lobe is concerning for metastatic lesion.  We will get a CT scan.  Low suspicion for pneumonia at this time she does not really have a significant cough or fever.  Also less likely to be heart failure however will check a BNP just in case as she does have a history of coronary artery disease.   CT scan consistent with new metastatic cancer in her lungs, vertebral bodies, liver, mediastinal lymphadenopathy.  Start on pain medicine and she will discuss with her oncologist about thoracentesis and further workup.  Pertinent labs & imaging results that were available during my care of the patient were reviewed by me and considered in my medical decision making (see chart for details).  ____________________________________________  FINAL CLINICAL IMPRESSION(S) / ED DIAGNOSES  Final diagnoses:  Acute left-sided thoracic  back pain  Pleural effusion    MEDICATIONS GIVEN DURING THIS VISIT:  Medications  iopamidol (ISOVUE-300) 61 % injection 75 mL (75 mLs Intravenous Contrast Given 12/21/17 2000)  fentaNYL (SUBLIMAZE) injection 50 mcg (50 mcg Intravenous Given 12/21/17 2023)     NEW OUTPATIENT MEDICATIONS STARTED DURING THIS VISIT:  Discharge Medication List as of 12/21/2017  9:24 PM    START taking these medications   Details  !! oxyCODONE-acetaminophen (PERCOCET) 5-325 MG tablet Take 2 tablets by mouth every 4 (four) hours as needed., Starting Sun 12/21/2017, Print    !! oxyCODONE-acetaminophen (PERCOCET/ROXICET) 5-325 MG tablet Take 1-2 tablets by mouth every 8 (eight) hours as needed for severe pain., Starting Sun 12/21/2017, Print     !! - Potential duplicate medications found. Please discuss with provider.      Note:  This note was prepared with  assistance of Dragon voice recognition software. Occasional wrong-word or sound-a-like substitutions may have occurred due to the inherent limitations of voice recognition software.   Merrily Pew, MD 12/21/17 [phone]

## 2017-12-22 ENCOUNTER — Inpatient Hospital Stay: Payer: Medicare Other | Attending: Hematology and Oncology | Admitting: Hematology and Oncology

## 2017-12-22 ENCOUNTER — Inpatient Hospital Stay: Payer: Medicare Other

## 2017-12-22 ENCOUNTER — Telehealth: Payer: Self-pay | Admitting: Hematology and Oncology

## 2017-12-22 VITALS — BP 116/67 | HR 90 | Temp 97.7°F | Resp 18 | Wt 171.8 lb

## 2017-12-22 DIAGNOSIS — C50412 Malignant neoplasm of upper-outer quadrant of left female breast: Secondary | ICD-10-CM

## 2017-12-22 DIAGNOSIS — K769 Liver disease, unspecified: Secondary | ICD-10-CM | POA: Diagnosis not present

## 2017-12-22 DIAGNOSIS — C7800 Secondary malignant neoplasm of unspecified lung: Secondary | ICD-10-CM

## 2017-12-22 DIAGNOSIS — J90 Pleural effusion, not elsewhere classified: Secondary | ICD-10-CM | POA: Diagnosis not present

## 2017-12-22 DIAGNOSIS — Z170 Estrogen receptor positive status [ER+]: Principal | ICD-10-CM

## 2017-12-22 DIAGNOSIS — Z853 Personal history of malignant neoplasm of breast: Secondary | ICD-10-CM | POA: Insufficient documentation

## 2017-12-22 DIAGNOSIS — C7951 Secondary malignant neoplasm of bone: Secondary | ICD-10-CM | POA: Insufficient documentation

## 2017-12-22 LAB — CMP (CANCER CENTER ONLY)
ALT: 16 U/L (ref 0–55)
AST: 24 U/L (ref 5–34)
Albumin: 3.3 g/dL — ABNORMAL LOW (ref 3.5–5.0)
Alkaline Phosphatase: 95 U/L (ref 40–150)
Anion gap: 10 (ref 3–11)
BUN: 18 mg/dL (ref 7–26)
CO2: 27 mmol/L (ref 22–29)
Calcium: 9.7 mg/dL (ref 8.4–10.4)
Chloride: 103 mmol/L (ref 98–109)
Creatinine: 1.04 mg/dL (ref 0.60–1.10)
GFR, Est AFR Am: 59 mL/min — ABNORMAL LOW (ref >60)
GFR, Estimated: 51 mL/min — ABNORMAL LOW (ref >60)
Glucose, Bld: 108 mg/dL (ref 70–140)
Potassium: 3.6 mmol/L (ref 3.3–4.7)
Sodium: 140 mmol/L (ref 136–145)
Total Bilirubin: 0.3 mg/dL (ref 0.2–1.2)
Total Protein: 6.6 g/dL (ref 6.4–8.3)

## 2017-12-22 NOTE — Progress Notes (Signed)
Patient Care Team: [name], [place] At as PCP - General (Family Medicine)  DIAGNOSIS:  Encounter Diagnoses  Name Primary?  . Malignant neoplasm of upper-outer quadrant of left breast in female, estrogen receptor positive ([place]) Yes  . Malignant neoplasm metastatic to lung, unspecified laterality ([place])     SUMMARY OF ONCOLOGIC HISTORY:   Malignant neoplasm of upper-outer quadrant of left breast in female, estrogen receptor positive ([place])   07/28/2013 Mammogram    Spiculated mass at 6:00 position 3 cm, ultrasound revealed 2.6 x 2.1 x 1.5 cm second mass at 12:00 2.1 x 1.8 x 2.1 cm: 2 abnormal lymph nodes left axilla      10/29/2013 Initial Diagnosis    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 [name], T3N2 (5/10 LN positive) stage IIIa IDC grade 1, ER/PR 100%, TSH 14% HER-2 negative PET/CT 12/20/2013 negative for metastases      12/17/2013 - 04/02/2014 Chemotherapy    Taxotere Cytoxan every 3 weeks for 6 cycles      05/11/2014 - 06/27/2014 Radiation Therapy    Adjuvant radiation therapy to chest wall and lymph nodes      07/06/2014 -  Anti-estrogen oral therapy    Arimidex 1 mg daily      12/21/2017 Imaging    Metastatic lesions noted in the lungs bilaterally, bilateral hilar and mediastinal lymph nodes, left pleural effusion, multiple thoracic vertebral metastases, heterogeneous lesions in the liver       CHIEF COMPLIANT: Urgent follow-up regarding CT chest findings  INTERVAL HISTORY: RASHAWNA [name] is a 77 year old with above-mentioned history of left breast cancer treated with mastectomy followed by adjuvant chemotherapy and radiation and is currently on Arimidex therapy.  She went to the emergency room because of fatigue and shortness of breath and was undergone a CT chest that revealed bilateral lung nodules, left  pleural effusion with pleural nodules, liver and bone metastases.  She came in urgently to discuss the results of the scans and to figure out the treatment plan.  REVIEW OF SYSTEMS:   Constitutional: Denies fevers, chills or abnormal weight loss Eyes: Denies blurriness of vision Ears, nose, mouth, throat, and face: Denies mucositis or sore throat Respiratory: Shortness of breath to exertion Cardiovascular: Denies palpitation, chest discomfort Gastrointestinal:  Denies nausea, heartburn or change in bowel habits Skin: Denies abnormal skin rashes Lymphatics: Denies new lymphadenopathy or easy bruising Neurological:Denies numbness, tingling or new weaknesses Behavioral/Psych: Mood is stable, no new changes  Extremities: No lower extremity edema  All other systems were reviewed with the patient and are negative.  I have reviewed the past medical history, past surgical history, social history and family history with the patient and they are unchanged from previous note.  ALLERGIES:  has No Known Allergies.  MEDICATIONS:  Current Outpatient Medications  Medication Sig Dispense Refill  . amoxicillin (AMOXIL) 500 MG capsule Take 1 capsule (500 mg total) by mouth 3 (three) times daily. 21 capsule 0  . anastrozole (ARIMIDEX) 1 MG tablet Take 1 tablet (1 mg total) by mouth daily. 90 tablet 3  . aspirin EC 81 MG tablet Take 81 mg by mouth daily.    [name] atorvastatin (LIPITOR) 40 MG tablet Take 20 mg by mouth at bedtime.     [name] CALCIUM PO Take 1 tablet by mouth daily.    . Multiple Vitamin (MULTIVITAMIN  WITH MINERALS) TABS tablet Take 1 tablet by mouth daily.    [name] NITROSTAT 0.4 MG SL tablet Place 0.4 mg under the tongue every 5 (five) minutes as needed for chest pain.     [name] omega-3 acid ethyl esters (LOVAZA) 1 G capsule Take 1 g by mouth daily.    [name] oxyCODONE-acetaminophen (PERCOCET) 5-325 MG tablet Take 2 tablets by mouth every 4 (four) hours as needed. 30 tablet 0  . oxyCODONE-acetaminophen  (PERCOCET/ROXICET) 5-325 MG tablet Take 1-2 tablets by mouth every 8 (eight) hours as needed for severe pain. 6 tablet 0  . valsartan-hydrochlorothiazide (DIOVAN HCT) 160-12.5 MG tablet Take 1 tablet by mouth daily. 1/2 tab twice daily (Patient taking differently: Take 0.5 tablets by mouth at bedtime. )     No current facility-administered medications for this visit.     PHYSICAL EXAMINATION: ECOG PERFORMANCE STATUS: 1 - Symptomatic but completely ambulatory  Vitals:   12/22/17 1358  BP: 116/67  Pulse: 90  Resp: 18  Temp: 97.7 F (36.5 C)  SpO2: 97%   Filed Weights   12/22/17 1358  Weight: 171 lb 12.8 oz (77.9 kg)    GENERAL:alert, no distress and comfortable SKIN: skin color, texture, turgor are normal, no rashes or significant lesions EYES: normal, Conjunctiva are pink and non-injected, sclera clear OROPHARYNX:no exudate, no erythema and lips, buccal mucosa, and tongue normal  NECK: supple, thyroid normal size, non-tender, without nodularity LYMPH:  no palpable lymphadenopathy in the cervical, axillary or inguinal LUNGS: clear to auscultation and percussion with normal breathing effort HEART: regular rate & rhythm and no murmurs and no lower extremity edema ABDOMEN:abdomen soft, non-tender and normal bowel sounds MUSCULOSKELETAL:no cyanosis of digits and no clubbing  NEURO: alert & oriented x 3 with fluent speech, no focal motor/sensory deficits EXTREMITIES: No lower extremity edema  LABORATORY DATA:  I have reviewed the data as listed CMP Latest Ref Rng & Units 12/22/2017 12/21/2017 09/24/2017  Glucose 70 - 140 mg/dL 108 114(H) 112(H)  BUN 7 - 26 mg/dL 18 21(H) 10  Creatinine 0.44 - 1.00 mg/dL - 1.11(H) 1.01(H)  Sodium 136 - 145 mmol/L 140 141 136  Potassium 3.3 - 4.7 mmol/L 3.6 3.5 3.6  Chloride 98 - 109 mmol/L 103 105 107  CO2 22 - 29 mmol/L 27 25 22   Calcium 8.4 - 10.4 mg/dL 9.7 9.7 8.3(L)  Total Protein 6.4 - 8.3 g/dL 6.6 - 5.5(L)  Total Bilirubin 0.2 - 1.2 mg/dL  0.3 - 0.6  Alkaline Phos 40 - 150 U/L 95 - 51  AST 5 - 34 U/L 24 - 25  ALT 0 - 55 U/L 16 - 21    Lab Results  Component Value Date   WBC 5.2 12/21/2017   HGB 11.0 (L) 12/21/2017   HCT 34.5 (L) 12/21/2017   MCV 88.2 12/21/2017   PLT 291 12/21/2017   NEUTROABS 2.0 09/26/2017    ASSESSMENT & PLAN:  Malignant neoplasm of upper-outer quadrant of left breast in female, 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 She had radiation therapy and has been on antiestrogen therapy with Arimidex since 06/28/2014  CT chest 12/21/2017: Multiple bilateral lung nodules, bilateral mediastinal nodes, multiple thoracic vertebral metastases, left pleural effusion, liver lesions  I discussed with the patient that these findings are concerning for metastatic disease.  We would like to  obtain a thoracentesis to evaluate for cytology.  If the cytology does not show cancer then we will have to perform a core biopsy.  I would like to obtain a PET/CT scan. I would like to see the patient in 1 week to go over the results of the test and to discuss treatment plan and options. At the patient's request, I spoke with Dr. Sonny Dandy and updated him about her findings.  I spent 25 minutes talking to the patient of which more than half was spent in counseling and coordination of care.  Orders Placed This Encounter  Procedures  . US Thoracentesis Asp Pleural space w/IMG guide    Send fluid for cytology    Standing Status:   Future    Standing Expiration Date:   12/22/2018    Scheduling Instructions:     D/W Dr.Trevor Shick    Order Specific Question:   Are labs required for specimen collection?    Answer:   No    Order Specific Question:   Reason for Exam (SYMPTOM  OR DIAGNOSIS REQUIRED)    Answer:   Concern for metastatic disease with  history of breast cancer    Order Specific Question:   Preferred imaging location?    Answer:   [facility] ([facility] Skull Base To Thigh    Standing Status:   Future    Standing Expiration Date:   12/22/2018    Order Specific Question:   If indicated for the ordered procedure, I authorize the administration of a radiopharmaceutical per Radiology protocol    Answer:   Yes    Order Specific Question:   Preferred imaging location?    Answer:   [facility]    Order Specific Question:   Radiology Contrast Protocol - do NOT remove file path    Answer:   [email or web address]    Order Specific Question:   Reason for Exam additional comments    Answer:   Metastatic cancer evaluation with lung nodules to assess where to biopsy  . CEA (IN HOUSE-[facility])    Standing Status:   Future    Number of Occurrences:   1    Standing Expiration Date:   12/22/2018  . Cancer antigen 27.29    Standing Status:   Future    Number of Occurrences:   1    Standing Expiration Date:   01/26/2019  . CMP ([place] only)    Standing Status:   Future    Number of Occurrences:   1    Standing Expiration Date:   12/22/2018   The patient has a good understanding of the overall plan. she agrees with it. she will call with any problems that may develop before the next visit here.   Harriette Ohara, MD 12/22/17

## 2017-12-22 NOTE — Assessment & Plan Note (Signed)
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 She had radiation therapy and has been on antiestrogen therapy with Arimidex since 06/28/2014  CT chest 12/21/2017: Multiple bilateral lung nodules, bilateral mediastinal nodes, multiple thoracic vertebral metastases, left pleural effusion, liver lesions  I discussed with the patient that these findings are concerning for metastatic disease.  We would like to obtain a thoracentesis to evaluate for cytology.  If the cytology does not show cancer then we will have to perform a core biopsy.  I would like to obtain a PET/CT scan. I would like to see the patient in 1 week to go over the results of the test and to discuss treatment plan and options.

## 2017-12-22 NOTE — Telephone Encounter (Signed)
Gave patient AVs and calendar of upcoming January and February appointments.

## 2017-12-23 ENCOUNTER — Ambulatory Visit (HOSPITAL_COMMUNITY)
Admission: RE | Admit: 2017-12-23 | Discharge: 2017-12-23 | Disposition: A | Discharge status: Home / Self Care | Payer: Medicare Other | Source: Ambulatory Visit | Attending: Hematology and Oncology | Admitting: Hematology and Oncology

## 2017-12-23 ENCOUNTER — Ambulatory Visit (HOSPITAL_COMMUNITY)
Admission: RE | Admit: 2017-12-23 | Discharge: 2017-12-23 | Disposition: A | Discharge status: Home / Self Care | Payer: Medicare Other | Source: Ambulatory Visit | Attending: Radiology | Admitting: Radiology

## 2017-12-23 DIAGNOSIS — C50412 Malignant neoplasm of upper-outer quadrant of left female breast: Secondary | ICD-10-CM | POA: Insufficient documentation

## 2017-12-23 DIAGNOSIS — C384 Malignant neoplasm of pleura: Secondary | ICD-10-CM | POA: Diagnosis not present

## 2017-12-23 DIAGNOSIS — C7800 Secondary malignant neoplasm of unspecified lung: Secondary | ICD-10-CM | POA: Diagnosis not present

## 2017-12-23 DIAGNOSIS — Z170 Estrogen receptor positive status [ER+]: Secondary | ICD-10-CM | POA: Diagnosis not present

## 2017-12-23 DIAGNOSIS — J910 Malignant pleural effusion: Secondary | ICD-10-CM | POA: Diagnosis not present

## 2017-12-23 DIAGNOSIS — Z9889 Other specified postprocedural states: Secondary | ICD-10-CM

## 2017-12-23 DIAGNOSIS — J90 Pleural effusion, not elsewhere classified: Secondary | ICD-10-CM | POA: Insufficient documentation

## 2017-12-23 LAB — CANCER ANTIGEN 27.29: CA 27.29: 332.6 U/mL — ABNORMAL HIGH (ref 0.0–38.6)

## 2017-12-23 LAB — CEA (IN HOUSE-CHCC): CEA (CHCC-In House): 2.36 ng/mL (ref 0.00–5.00)

## 2017-12-23 MED ORDER — LIDOCAINE HCL 1 % IJ SOLN
INTRAMUSCULAR | Status: AC
  Filled 2017-12-23: qty 10

## 2017-12-23 NOTE — Procedures (Signed)
Ultrasound-guided diagnostic and therapeutic left thoracentesis performed yielding 580 cc of hazy, blood-tinged fluid. No immediate complications. Follow-up chest x-ray pending.The fluid was sent to the lab for cytology.

## 2017-12-25 NOTE — Assessment & Plan Note (Signed)
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 She had radiation therapy and has been on antiestrogen therapy with Arimidex since 06/28/2014  CT chest 12/21/2017: Multiple bilateral lung nodules, bilateral mediastinal nodes, multiple thoracic vertebral metastases, left pleural effusion, liver lesions  Thoracentesis: Met Breast cancer ER PR Positive and Her 2 Neg   PET/CT scan. Scheduled for 01/04/18 Recommendation Ibrance with Faslodex  Discussed the risks and benefits of Ibrance

## 2017-12-26 ENCOUNTER — Telehealth: Payer: Self-pay | Admitting: Pharmacy Technician

## 2017-12-26 ENCOUNTER — Telehealth: Payer: Self-pay | Admitting: Pharmacist

## 2017-12-26 ENCOUNTER — Telehealth: Payer: Self-pay | Admitting: Hematology and Oncology

## 2017-12-26 ENCOUNTER — Inpatient Hospital Stay: Payer: Medicare Other | Attending: Hematology and Oncology | Admitting: Hematology and Oncology

## 2017-12-26 DIAGNOSIS — C50412 Malignant neoplasm of upper-outer quadrant of left female breast: Secondary | ICD-10-CM

## 2017-12-26 DIAGNOSIS — C7801 Secondary malignant neoplasm of right lung: Secondary | ICD-10-CM | POA: Diagnosis not present

## 2017-12-26 DIAGNOSIS — Z170 Estrogen receptor positive status [ER+]: Secondary | ICD-10-CM | POA: Diagnosis not present

## 2017-12-26 DIAGNOSIS — Z5111 Encounter for antineoplastic chemotherapy: Secondary | ICD-10-CM | POA: Insufficient documentation

## 2017-12-26 DIAGNOSIS — C7802 Secondary malignant neoplasm of left lung: Secondary | ICD-10-CM

## 2017-12-26 DIAGNOSIS — C7951 Secondary malignant neoplasm of bone: Secondary | ICD-10-CM

## 2017-12-26 MED ORDER — PALBOCICLIB 125 MG PO CAPS: 125.0000 mg | ORAL_CAPSULE | Freq: Every day | ORAL | 3 refills | Status: DC

## 2017-12-26 MED FILL — IBRANCE 125 MG CAPSULE: 125 | 21 days supply | Qty: 21 | Fill #0

## 2017-12-26 NOTE — Telephone Encounter (Signed)
Oral Chemotherapy Pharmacist Encounter   I spoke with patient and daughter in law, Rodena Piety, in exam room for overview of: Ibrance.   Pt is doing well. Counseled patient on administration, dosing, side effects, monitoring, drug-food interactions, safe handling, storage, and disposal.  Patient will take Ibrance 125mg  capsules, 1 capsule by mouth once daily with breakfast, for 3 weeks on, 1 week off. Patient knows to avoid grapefruit and grapefruit juice. Ibrance start date: 12/29/17  Side effects include but not limited to: fatigue, hair loss, GI upset, nausea, decreased blood counts, and increased upper respiratory infections.  Reviewed with patient importance of keeping a medication schedule and plan for any missed doses.  Mrs. Myhand voiced understanding and appreciation.   All questions answered. Medication reconciliation performed and medication/allergy list updated.  Patient will pick-up Ibrance from the pharmacy on the way home today and start Monday morning (12/29/17) with breakfast. She is receiving her 1st Faslodex injection on Monday as well.  Patient understands the office will be in touch after insurance authorization is obtained to discuss copayment.   Patient knows to call the office with questions or concerns. Oral Oncology Clinic will continue to follow.  Thank you,  Johny Drilling, PharmD, BCPS, BCOP 12/26/2017   4:46 PM Oral Oncology Clinic [phone]

## 2017-12-26 NOTE — Telephone Encounter (Signed)
Gave patient AVS and calendar of upcoming February through April appointments.  °

## 2017-12-26 NOTE — Progress Notes (Signed)
Patient Care Team: [name], [place] At as PCP - General (Family Medicine)  DIAGNOSIS:  Encounter Diagnosis  Name Primary?  . Malignant neoplasm of upper-outer quadrant of left breast in female, estrogen receptor positive ([place])     SUMMARY OF ONCOLOGIC HISTORY:   Malignant neoplasm of upper-outer quadrant of left breast in female, estrogen receptor positive ([place])   07/28/2013 Mammogram    Spiculated mass at 6:00 position 3 cm, ultrasound revealed 2.6 x 2.1 x 1.5 cm second mass at 12:00 2.1 x 1.8 x 2.1 cm: 2 abnormal lymph nodes left axilla      10/29/2013 Initial Diagnosis    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 [name], T3N2 (5/10 LN positive) stage IIIa IDC grade 1, ER/PR 100%, TSH 14% HER-2 negative PET/CT 12/20/2013 negative for metastases      12/17/2013 - 04/02/2014 Chemotherapy    Taxotere Cytoxan every 3 weeks for 6 cycles      05/11/2014 - 06/27/2014 Radiation Therapy    Adjuvant radiation therapy to chest wall and lymph nodes      07/06/2014 -  Anti-estrogen oral therapy    Arimidex 1 mg daily      12/21/2017 Imaging    Metastatic lesions noted in the lungs bilaterally, bilateral hilar and mediastinal lymph nodes, left pleural effusion, multiple thoracic vertebral metastases, heterogeneous lesions in the liver      12/22/2017 Procedure    Pleural Effusion Thoracentesis: Metastatic Breast cancer ER PR Positive Her 2 Neg       CHIEF COMPLIANT: Follow-up to discuss the pathology report from the pleural effusion thoracentesis  INTERVAL HISTORY: Wendy [name] is a 77 year old with a prior history of breast cancer who developed metastatic disease detected on the CT chest.  She underwent thoracentesis for pleural effusion and is here today to discuss the pathology report.  The malignant cells were positive  for ER PR and were negative HER-2/neu.  She is here today to discuss treatment options.  Is scheduled for a PET/CT scan next Monday.  REVIEW OF SYSTEMS:   Constitutional: Denies fevers, chills or abnormal weight loss Eyes: Denies blurriness of vision Ears, nose, mouth, throat, and face: Denies mucositis or sore throat Respiratory: Denies cough, dyspnea or wheezes Cardiovascular: Denies palpitation, chest discomfort Gastrointestinal:  Denies nausea, heartburn or change in bowel habits Skin: Denies abnormal skin rashes Lymphatics: Denies new lymphadenopathy or easy bruising Neurological:Denies numbness, tingling or new weaknesses Behavioral/Psych: Mood is stable, no new changes  Extremities: No lower extremity edema All other systems were reviewed with the patient and are negative.  I have reviewed the past medical history, past surgical history, social history and family history with the patient and they are unchanged from previous note.  ALLERGIES:  has No Known Allergies.  MEDICATIONS:  Current Outpatient Medications  Medication Sig Dispense Refill  . amoxicillin (AMOXIL) 500 MG capsule Take 1 capsule (500 mg total) by mouth 3 (three) times daily. 21 capsule 0  . aspirin EC 81 MG tablet Take 81 mg by mouth daily.    [name] atorvastatin (LIPITOR) 40 MG tablet Take 20 mg by mouth at bedtime.     [name] CALCIUM PO Take 1 tablet by mouth daily.    . Multiple Vitamin (MULTIVITAMIN WITH MINERALS) TABS tablet Take 1 tablet by mouth daily.    [name] NITROSTAT 0.4 MG  SL tablet Place 0.4 mg under the tongue every 5 (five) minutes as needed for chest pain.     [name] omega-3 acid ethyl esters (LOVAZA) 1 G capsule Take 1 g by mouth daily.    [name] oxyCODONE-acetaminophen (PERCOCET) 5-325 MG tablet Take 2 tablets by mouth every 4 (four) hours as needed. 30 tablet 0  . oxyCODONE-acetaminophen (PERCOCET/ROXICET) 5-325 MG tablet Take 1-2 tablets by mouth every 8 (eight) hours as needed for severe pain. 6 tablet 0  .  palbociclib (IBRANCE) 125 MG capsule Take 1 capsule (125 mg total) by mouth daily with breakfast. Take for 21 days on, 7 days off. Take whole with food. 21 capsule 3  . valsartan-hydrochlorothiazide (DIOVAN HCT) 160-12.5 MG tablet Take 1 tablet by mouth daily. 1/2 tab twice daily (Patient taking differently: Take 0.5 tablets by mouth at bedtime. )     No current facility-administered medications for this visit.     PHYSICAL EXAMINATION: ECOG PERFORMANCE STATUS: 1 - Symptomatic but completely ambulatory  Vitals:   12/26/17 1120  BP: (!) 117/53  Pulse: 79  Resp: 18  Temp: (!) 97.5 F (36.4 C)  SpO2: 99%   Filed Weights   12/26/17 1120  Weight: 168 lb 4.8 oz (76.3 kg)    GENERAL:alert, no distress and comfortable SKIN: skin color, texture, turgor are normal, no rashes or significant lesions EYES: normal, Conjunctiva are pink and non-injected, sclera clear OROPHARYNX:no exudate, no erythema and lips, buccal mucosa, and tongue normal  NECK: supple, thyroid normal size, non-tender, without nodularity LYMPH:  no palpable lymphadenopathy in the cervical, axillary or inguinal LUNGS: clear to auscultation and percussion with normal breathing effort HEART: regular rate & rhythm and no murmurs and no lower extremity edema ABDOMEN:abdomen soft, non-tender and normal bowel sounds MUSCULOSKELETAL:no cyanosis of digits and no clubbing  NEURO: alert & oriented x 3 with fluent speech, no focal motor/sensory deficits EXTREMITIES: No lower extremity edema  LABORATORY DATA:  I have reviewed the data as listed CMP Latest Ref Rng & Units 12/22/2017 12/21/2017 09/24/2017  Glucose 70 - 140 mg/dL 108 114(H) 112(H)  BUN 7 - 26 mg/dL 18 21(H) 10  Creatinine 0.44 - 1.00 mg/dL - 1.11(H) 1.01(H)  Sodium 136 - 145 mmol/L 140 141 136  Potassium 3.3 - 4.7 mmol/L 3.6 3.5 3.6  Chloride 98 - 109 mmol/L 103 105 107  CO2 22 - 29 mmol/L 27 25 22   Calcium 8.4 - 10.4 mg/dL 9.7 9.7 8.3(L)  Total Protein 6.4 - 8.3  g/dL 6.6 - 5.5(L)  Total Bilirubin 0.2 - 1.2 mg/dL 0.3 - 0.6  Alkaline Phos 40 - 150 U/L 95 - 51  AST 5 - 34 U/L 24 - 25  ALT 0 - 55 U/L 16 - 21    Lab Results  Component Value Date   WBC 5.2 12/21/2017   HGB 11.0 (L) 12/21/2017   HCT 34.5 (L) 12/21/2017   MCV 88.2 12/21/2017   PLT 291 12/21/2017   NEUTROABS 2.0 09/26/2017    ASSESSMENT & PLAN:  Malignant neoplasm of upper-outer quadrant of left breast in female, 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 She had radiation therapy and has been on antiestrogen therapy with Arimidex since 06/28/2014  CT chest 12/21/2017: Multiple bilateral lung nodules, bilateral mediastinal nodes, multiple thoracic vertebral metastases, left pleural effusion, liver lesions  Thoracentesis: Met Breast cancer ER PR Positive and Her 2 Neg   PET/CT scan. Scheduled for 01/04/18 Recommendation Ibrance with Faslodex Ibrance: I discussed the risks and benefits of Ibrance including myelosuppression especially neutropenia and with that risk of infection, there is risk of pulmonary embolism and mild peripheral neuropathy as well. Fatigue, nausea, diarrhea, decreased appetite as well as alopecia and thrombocytopenia are also potential side effects of Ibrance I also discussed the risks and benefits of Faslodex and provided her with information about both of these treatments. She will start Faslodex from next Monday. We were able to get her 1 month starter pack to begin her treatment with Ibrance.  Return to clinic in 2 weeks for lab work and follow-up including injection appointment for Faslodex loading dose.   I spent 25 minutes talking to the patient of which more than half was spent in counseling and coordination of care.  No orders of the defined types were placed in this  encounter.  The patient has a good understanding of the overall plan. she agrees with it. she will call with any problems that may develop before the next visit here.   Harriette Ohara, MD 12/26/17

## 2017-12-26 NOTE — Telephone Encounter (Signed)
Oral Oncology Patient Advocate Encounter  Received notification from Wendy [name] that prior authorization for Wendy [name] is required.  PA submitted on CoverMyMeds Key Wendy [name] Status is pending  Oral Oncology Clinic will continue to follow.  Wendy Asa. Wendy [name], [place] Patient [place] [phone] 12/26/2017 12:48 PM

## 2017-12-26 NOTE — Telephone Encounter (Signed)
Oral Oncology Pharmacist Encounter  Received new prescription for Ibrance (palbociclib) for the treatment of hormone receptor positive metastatic breast cancer in conjunction with Faslodex injections, planned duration until disease progression or unacceptable toxicities..  Labs from [place] assessed, [place] for treatemnt.  Current medication list in Epic reviewed, DDIs with Ibrance identified:  Leslee Home is an inhibitor of CYP3A4 and may lead to increased exposure, due to decreased metabolism, of Lipitor. This will be monitored. No change to therapy indicated at this time.  Prescription has been e-scribed to the [facility] for benefits analysis and approval. Prior authorization is required and is submitted. 1st fill will be made ready at the [facility] for $0 out of pocket expense on manufacturer voucher.  Oral Oncology Clinic will continue to follow for insurance authorization, copayment issues, initial counseling and start date.  Johny Drilling, PharmD, BCPS, BCOP 12/26/2017 12:30 PM Oral Oncology Clinic [phone]

## 2017-12-29 ENCOUNTER — Inpatient Hospital Stay: Payer: Medicare Other

## 2017-12-29 ENCOUNTER — Telehealth: Payer: Self-pay | Admitting: Hematology and Oncology

## 2017-12-29 DIAGNOSIS — C7801 Secondary malignant neoplasm of right lung: Secondary | ICD-10-CM | POA: Diagnosis not present

## 2017-12-29 DIAGNOSIS — C7802 Secondary malignant neoplasm of left lung: Secondary | ICD-10-CM | POA: Diagnosis not present

## 2017-12-29 DIAGNOSIS — C7951 Secondary malignant neoplasm of bone: Secondary | ICD-10-CM | POA: Diagnosis not present

## 2017-12-29 DIAGNOSIS — C7800 Secondary malignant neoplasm of unspecified lung: Secondary | ICD-10-CM

## 2017-12-29 DIAGNOSIS — Z5111 Encounter for antineoplastic chemotherapy: Secondary | ICD-10-CM | POA: Diagnosis not present

## 2017-12-29 DIAGNOSIS — C50412 Malignant neoplasm of upper-outer quadrant of left female breast: Secondary | ICD-10-CM | POA: Diagnosis not present

## 2017-12-29 DIAGNOSIS — Z170 Estrogen receptor positive status [ER+]: Secondary | ICD-10-CM

## 2017-12-29 MED ORDER — FULVESTRANT 250 MG/5ML IM SOLN
500.0000 mg | Freq: Once | INTRAMUSCULAR | Status: AC
  Administered 2017-12-29: 500 mg via INTRAMUSCULAR

## 2017-12-29 NOTE — Patient Instructions (Signed)

## 2017-12-29 NOTE — Telephone Encounter (Signed)
AVS/ Calendar printed No LOS for 2/4

## 2017-12-31 ENCOUNTER — Telehealth: Payer: Self-pay | Admitting: Pharmacy Technician

## 2017-12-31 NOTE — Telephone Encounter (Signed)
Oral Oncology Patient Advocate Encounter  Was successful in securing patient a $15,000 grant from [employer] to provide copayment coverage for [place]. This will keep the out of pocket expense at $0.   I have spoken with the patient..   The billing information is as follows and has been shared with [place].   Member ID: [id number] Group ID: [id number] RxBin: [id number] Dates of Eligibility: 12/01/2017 through 11/30/2018  Fabio Asa. Melynda Keller, [place] Patient [place] [phone] 12/31/2017 12:44 PM

## 2018-01-05 ENCOUNTER — Encounter (HOSPITAL_COMMUNITY)
Admission: RE | Admit: 2018-01-05 | Discharge: 2018-01-05 | Disposition: A | Discharge status: Home / Self Care | Payer: Medicare Other | Source: Ambulatory Visit | Attending: Hematology and Oncology | Admitting: Hematology and Oncology

## 2018-01-05 ENCOUNTER — Encounter (HOSPITAL_COMMUNITY): Payer: Medicare Other

## 2018-01-05 DIAGNOSIS — C7800 Secondary malignant neoplasm of unspecified lung: Secondary | ICD-10-CM | POA: Diagnosis not present

## 2018-01-05 DIAGNOSIS — C50412 Malignant neoplasm of upper-outer quadrant of left female breast: Secondary | ICD-10-CM | POA: Diagnosis not present

## 2018-01-05 DIAGNOSIS — Z170 Estrogen receptor positive status [ER+]: Secondary | ICD-10-CM | POA: Diagnosis not present

## 2018-01-05 LAB — GLUCOSE, CAPILLARY: Glucose-Capillary: 90 mg/dL (ref 65–99)

## 2018-01-05 MED ORDER — FLUDEOXYGLUCOSE F - 18 (FDG) INJECTION
9.2500 | Freq: Once | INTRAVENOUS | Status: AC | PRN
  Administered 2018-01-05: 9.25 via INTRAVENOUS

## 2018-01-06 ENCOUNTER — Other Ambulatory Visit: Payer: Self-pay | Admitting: Hematology and Oncology

## 2018-01-07 NOTE — Telephone Encounter (Signed)
Oral Oncology Patient Advocate Encounter  Prior Authorization for Wendy [name] has been approved.    Effective dates: 12/26/2017 through 12/26/2018  Oral Oncology Clinic will continue to follow.   Wendy [name]. Wendy [name], Wendy [name] [phone] 01/07/2018 4:53 PM

## 2018-01-09 ENCOUNTER — Other Ambulatory Visit: Payer: Self-pay

## 2018-01-09 DIAGNOSIS — Z170 Estrogen receptor positive status [ER+]: Principal | ICD-10-CM

## 2018-01-09 DIAGNOSIS — C50412 Malignant neoplasm of upper-outer quadrant of left female breast: Secondary | ICD-10-CM

## 2018-01-12 ENCOUNTER — Inpatient Hospital Stay (HOSPITAL_BASED_OUTPATIENT_CLINIC_OR_DEPARTMENT_OTHER): Payer: Medicare Other | Admitting: Hematology and Oncology

## 2018-01-12 ENCOUNTER — Inpatient Hospital Stay: Payer: Medicare Other

## 2018-01-12 ENCOUNTER — Telehealth: Payer: Self-pay | Admitting: Hematology and Oncology

## 2018-01-12 ENCOUNTER — Encounter: Payer: Self-pay | Admitting: Hematology and Oncology

## 2018-01-12 VITALS — BP 125/55 | HR 84 | Temp 97.6°F | Resp 15 | Wt 164.9 lb

## 2018-01-12 DIAGNOSIS — Z5111 Encounter for antineoplastic chemotherapy: Secondary | ICD-10-CM | POA: Diagnosis not present

## 2018-01-12 DIAGNOSIS — C7802 Secondary malignant neoplasm of left lung: Secondary | ICD-10-CM | POA: Diagnosis not present

## 2018-01-12 DIAGNOSIS — C7800 Secondary malignant neoplasm of unspecified lung: Secondary | ICD-10-CM

## 2018-01-12 DIAGNOSIS — C7951 Secondary malignant neoplasm of bone: Secondary | ICD-10-CM | POA: Diagnosis not present

## 2018-01-12 DIAGNOSIS — C50412 Malignant neoplasm of upper-outer quadrant of left female breast: Secondary | ICD-10-CM

## 2018-01-12 DIAGNOSIS — C7801 Secondary malignant neoplasm of right lung: Secondary | ICD-10-CM

## 2018-01-12 DIAGNOSIS — Z170 Estrogen receptor positive status [ER+]: Principal | ICD-10-CM

## 2018-01-12 LAB — CMP (CANCER CENTER ONLY)
ALT: 9 U/L (ref 0–55)
AST: 18 U/L (ref 5–34)
Albumin: 3.4 g/dL — ABNORMAL LOW (ref 3.5–5.0)
Alkaline Phosphatase: 74 U/L (ref 40–150)
Anion gap: 10 (ref 3–11)
BUN: 25 mg/dL (ref 7–26)
CO2: 27 mmol/L (ref 22–29)
Calcium: 10.5 mg/dL — ABNORMAL HIGH (ref 8.4–10.4)
Chloride: 102 mmol/L (ref 98–109)
Creatinine: 1.24 mg/dL — ABNORMAL HIGH (ref 0.60–1.10)
GFR, Est AFR Am: 48 mL/min — ABNORMAL LOW (ref >60)
GFR, Estimated: 41 mL/min — ABNORMAL LOW (ref >60)
Glucose, Bld: 92 mg/dL (ref 70–140)
Potassium: 3.9 mmol/L (ref 3.5–5.1)
Sodium: 139 mmol/L (ref 136–145)
Total Bilirubin: 0.6 mg/dL (ref 0.2–1.2)
Total Protein: 7.2 g/dL (ref 6.4–8.3)

## 2018-01-12 LAB — CBC WITH DIFFERENTIAL (CANCER CENTER ONLY)
Basophils Absolute: 0 10*3/uL (ref 0.0–0.1)
Basophils Relative: 1 %
Eosinophils Absolute: 0.1 10*3/uL (ref 0.0–0.5)
Eosinophils Relative: 3 %
HCT: 33.8 % — ABNORMAL LOW (ref 34.8–46.6)
Hemoglobin: 11.1 g/dL — ABNORMAL LOW (ref 11.6–15.9)
Lymphocytes Relative: 36 %
Lymphs Abs: 0.8 10*3/uL — ABNORMAL LOW (ref 0.9–3.3)
MCH: 27.9 pg (ref 25.1–34.0)
MCHC: 32.8 g/dL (ref 31.5–36.0)
MCV: 85 fL (ref 79.5–101.0)
Monocytes Absolute: 0.2 10*3/uL (ref 0.1–0.9)
Monocytes Relative: 7 %
Neutro Abs: 1.2 10*3/uL — ABNORMAL LOW (ref 1.5–6.5)
Neutrophils Relative %: 53 %
Platelet Count: 194 10*3/uL (ref 145–400)
RBC: 3.98 MIL/uL (ref 3.70–5.45)
RDW: 16.1 % — ABNORMAL HIGH (ref 11.2–14.5)
WBC Count: 2.2 10*3/uL — ABNORMAL LOW (ref 3.9–10.3)

## 2018-01-12 MED ORDER — FULVESTRANT 250 MG/5ML IM SOLN
500.0000 mg | Freq: Once | INTRAMUSCULAR | Status: AC
  Administered 2018-01-12: 500 mg via INTRAMUSCULAR

## 2018-01-12 MED ORDER — DENOSUMAB 120 MG/1.7ML ~~LOC~~ SOLN
120.0000 mg | Freq: Once | SUBCUTANEOUS | Status: AC
  Administered 2018-01-12: 120 mg via SUBCUTANEOUS

## 2018-01-12 MED ORDER — ONDANSETRON 8 MG PO TBDP: 8.0000 mg | ORAL_TABLET | Freq: Three times a day (TID) | ORAL | 6 refills | Status: DC | PRN

## 2018-01-12 MED ORDER — DENOSUMAB 120 MG/1.7ML ~~LOC~~ SOLN
SUBCUTANEOUS | Status: AC
  Filled 2018-01-12: qty 1.7

## 2018-01-12 MED ORDER — FULVESTRANT 250 MG/5ML IM SOLN
INTRAMUSCULAR | Status: AC
  Filled 2018-01-12: qty 10

## 2018-01-12 NOTE — Progress Notes (Signed)
Patient Care Team: [name], [place] At as PCP - General (Family Medicine)  DIAGNOSIS:  Encounter Diagnoses  Name Primary?  . Malignant neoplasm metastatic to lung, unspecified laterality ([place]) Yes  . Malignant neoplasm of upper-outer quadrant of left breast in female, estrogen receptor positive ([place])     SUMMARY OF ONCOLOGIC HISTORY:   Malignant neoplasm of upper-outer quadrant of left breast in female, estrogen receptor positive ([place])   07/28/2013 Mammogram    Spiculated mass at 6:00 position 3 cm, ultrasound revealed 2.6 x 2.1 x 1.5 cm second mass at 12:00 2.1 x 1.8 x 2.1 cm: 2 abnormal lymph nodes left axilla      10/29/2013 Initial Diagnosis    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 [name], T3N2 (5/10 LN positive) stage IIIa IDC grade 1, ER/PR 100%, TSH 14% HER-2 negative PET/CT 12/20/2013 negative for metastases      12/17/2013 - 04/02/2014 Chemotherapy    Taxotere Cytoxan every 3 weeks for 6 cycles      05/11/2014 - 06/27/2014 Radiation Therapy    Adjuvant radiation therapy to chest wall and lymph nodes      07/06/2014 -  Anti-estrogen oral therapy    Arimidex 1 mg daily      12/21/2017 Imaging    Metastatic lesions noted in the lungs bilaterally, bilateral hilar and mediastinal lymph nodes, left pleural effusion, multiple thoracic vertebral metastases, heterogeneous lesions in the liver      12/22/2017 Procedure    Pleural Effusion Thoracentesis: Metastatic Breast cancer ER PR Positive Her 2 Neg       CHIEF COMPLIANT: Follow-up to discuss the results of the PET/CT scan as well as side effects from Ibrance  INTERVAL HISTORY: Wendy [name] is a 29-year with above-mentioned history metastatic breast cancer is currently on Ibrance along with Faslodex.  She appears to be tolerating Ibrance fairly well.   Her major complaint is fatigue.  She denies any fevers or chills.  Denies any nausea vomiting or diarrhea constipation.  REVIEW OF SYSTEMS:   Constitutional: Denies fevers, chills or abnormal weight loss Eyes: Denies blurriness of vision Ears, nose, mouth, throat, and face: Denies mucositis or sore throat Respiratory: Denies cough, dyspnea or wheezes Cardiovascular: Denies palpitation, chest discomfort Gastrointestinal:  Denies nausea, heartburn or change in bowel habits Skin: Denies abnormal skin rashes Lymphatics: Denies new lymphadenopathy or easy bruising Neurological:Denies numbness, tingling or new weaknesses Behavioral/Psych: Mood is stable, no new changes  Extremities: No lower extremity edema  All other systems were reviewed with the patient and are negative.  I have reviewed the past medical history, past surgical history, social history and family history with the patient and they are unchanged from previous note.  ALLERGIES:  has No Known Allergies.  MEDICATIONS:  Current Outpatient Medications  Medication Sig Dispense Refill  . amoxicillin (AMOXIL) 500 MG capsule Take 1 capsule (500 mg total) by mouth 3 (three) times daily. 21 capsule 0  . aspirin EC 81 MG tablet Take 81 mg by mouth daily.    [name] atorvastatin (LIPITOR) 40 MG tablet Take 20 mg by mouth at bedtime.     [name] CALCIUM PO Take 1 tablet by mouth daily.    . Multiple Vitamin (MULTIVITAMIN WITH MINERALS) TABS tablet Take 1 tablet by mouth daily.    [name] NITROSTAT 0.4 MG SL tablet Place  0.4 mg under the tongue every 5 (five) minutes as needed for chest pain.     [name] omega-3 acid ethyl esters (LOVAZA) 1 G capsule Take 1 g by mouth daily.    [name] oxyCODONE-acetaminophen (PERCOCET) 5-325 MG tablet Take 2 tablets by mouth every 4 (four) hours as needed. 30 tablet 0  . oxyCODONE-acetaminophen (PERCOCET/ROXICET) 5-325 MG tablet Take 1-2 tablets by mouth every 8 (eight) hours as needed for severe pain. 6 tablet 0  . palbociclib  (IBRANCE) 125 MG capsule Take 1 capsule (125 mg total) by mouth daily with breakfast. Take for 21 days on, 7 days off. Take whole with food. 21 capsule 3  . valsartan-hydrochlorothiazide (DIOVAN HCT) 160-12.5 MG tablet Take 1 tablet by mouth daily. 1/2 tab twice daily (Patient taking differently: Take 0.5 tablets by mouth at bedtime. )     No current facility-administered medications for this visit.     PHYSICAL EXAMINATION: ECOG PERFORMANCE STATUS: 1 - Symptomatic but completely ambulatory  Vitals:   01/12/18 1114  BP: (!) 125/55  Pulse: 84  Resp: 15  Temp: 97.6 F (36.4 C)  SpO2: 100%   Filed Weights   01/12/18 1114  Weight: 164 lb 14.4 oz (74.8 kg)    GENERAL:alert, no distress and comfortable SKIN: skin color, texture, turgor are normal, no rashes or significant lesions EYES: normal, Conjunctiva are pink and non-injected, sclera clear OROPHARYNX:no exudate, no erythema and lips, buccal mucosa, and tongue normal  NECK: supple, thyroid normal size, non-tender, without nodularity LYMPH:  no palpable lymphadenopathy in the cervical, axillary or inguinal LUNGS: clear to auscultation and percussion with normal breathing effort HEART: regular rate & rhythm and no murmurs and no lower extremity edema ABDOMEN:abdomen soft, non-tender and normal bowel sounds MUSCULOSKELETAL:no cyanosis of digits and no clubbing  NEURO: alert & oriented x 3 with fluent speech, no focal motor/sensory deficits EXTREMITIES: No lower extremity edema  LABORATORY DATA:  I have reviewed the data as listed CMP Latest Ref Rng & Units 12/22/2017 12/21/2017 09/24/2017  Glucose 70 - 140 mg/dL 108 114(H) 112(H)  BUN 7 - 26 mg/dL 18 21(H) 10  Creatinine 0.60 - 1.10 mg/dL 1.04 1.11(H) 1.01(H)  Sodium 136 - 145 mmol/L 140 141 136  Potassium 3.3 - 4.7 mmol/L 3.6 3.5 3.6  Chloride 98 - 109 mmol/L 103 105 107  CO2 22 - 29 mmol/L _0 Calcium 8.4 - 10.4 mg/dL 9.7 9.7 8.3(L)  Total Protein 6.4 - 8.3 g/dL 6.6 -  5.5(L)  Total Bilirubin 0.2 - 1.2 mg/dL 0.3 - 0.6  Alkaline Phos 40 - 150 U/L 95 - 51  AST 5 - 34 U/L 24 - 25  ALT 0 - 55 U/L 16 - 21    Lab Results  Component Value Date   WBC 2.2 (L) 01/12/2018   HGB 11.0 (L) 12/21/2017   HCT 33.8 (L) 01/12/2018   MCV 85.0 01/12/2018   PLT 194 01/12/2018   NEUTROABS 1.2 (L) 01/12/2018    ASSESSMENT & PLAN:  Malignant neoplasm of upper-outer quadrant of left breast in female, 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 She had radiation therapy and has been on antiestrogen therapy with Arimidex since 06/28/2014  CT chest 12/21/2017: Multiple bilateral lung nodules, bilateral mediastinal nodes, multiple thoracic vertebral metastases, left pleural effusion, liver lesions  Thoracentesis: Met  Breast cancer ER PR Positive and Her 2 Neg PET/CT scan. 01/04/18: New malignant Pleural effusion with small bil pulm nodules, new mild bilateral mediastinal hilar portocaval lymph node metastases, new liver metastases, new diffuse bone metastases.  Current treatment: Ibrance with Faslodex Ibrance toxicities: Fatigue is a primary toxicity Lab review: ANC 1.2.  I discussed with her that as long as [place] is more than 1000, she can continue on the same dosage. Return to clinic in 2 weeks for follow-up.      I spent 25 minutes talking to the patient of which more than half was spent in counseling and coordination of care.  No orders of the defined types were placed in this encounter.  The patient has a good understanding of the overall plan. she agrees with it. she will call with any problems that may develop before the next visit here.   Harriette Ohara, MD 01/12/18

## 2018-01-12 NOTE — Progress Notes (Signed)
Received PA request from [facility] for Ondansetron.  Called number listed at bottom for [facility] medical ad was told the pharmacy had to change the way they were running. Called pharmacy([facility]) whom states she ran through the old and new Medicare and t wasn't going through. Asked her to call the pharmacy help desk to resolve. She states she will call tomorrow and that the patient was able to get a 30 day supply this time with the discount card but needs PA for next fill.

## 2018-01-12 NOTE — Patient Instructions (Signed)
Denosumab injection What is this medicine? DENOSUMAB (den oh sue mab) slows bone breakdown. Prolia is used to treat osteoporosis in women after menopause and in men. Delton See is used to treat a high calcium level due to cancer and to prevent bone fractures and other bone problems caused by multiple myeloma or cancer bone metastases. Delton See is also used to treat giant cell tumor of the bone. This medicine may be used for other purposes; ask your health care provider or pharmacist if you have questions. COMMON BRAND NAME(S): Prolia, XGEVA What should I tell my health care provider before I take this medicine? They need to know if you have any of these conditions: -dental disease -having surgery or tooth extraction -infection -kidney disease -low levels of calcium or Vitamin D in the blood -malnutrition -on hemodialysis -skin conditions or sensitivity -thyroid or parathyroid disease -an unusual reaction to denosumab, other medicines, foods, dyes, or preservatives -pregnant or trying to get pregnant -breast-feeding How should I use this medicine? This medicine is for injection under the skin. It is given by a health care professional in a hospital or clinic setting. If you are getting Prolia, a special MedGuide will be given to you by the pharmacist with each prescription and refill. Be sure to read this information carefully each time. For Prolia, talk to your pediatrician regarding the use of this medicine in children. Special care may be needed. For Delton See, talk to your pediatrician regarding the use of this medicine in children. While this drug may be prescribed for children as young as 13 years for selected conditions, precautions do apply. Overdosage: If you think you have taken too much of this medicine contact a poison control center or emergency room at once. NOTE: This medicine is only for you. Do not share this medicine with others. What if I miss a dose? It is important not to miss your  dose. Call your doctor or health care professional if you are unable to keep an appointment. What may interact with this medicine? Do not take this medicine with any of the following medications: -other medicines containing denosumab This medicine may also interact with the following medications: -medicines that lower your chance of fighting infection -steroid medicines like prednisone or cortisone This list may not describe all possible interactions. Give your health care provider a list of all the medicines, herbs, non-prescription drugs, or dietary supplements you use. Also tell them if you smoke, drink alcohol, or use illegal drugs. Some items may interact with your medicine. What should I watch for while using this medicine? Visit your doctor or health care professional for regular checks on your progress. Your doctor or health care professional may order blood tests and other tests to see how you are doing. Call your doctor or health care professional for advice if you get a fever, chills or sore throat, or other symptoms of a cold or flu. Do not treat yourself. This drug may decrease your body's ability to fight infection. Try to avoid being around people who are sick. You should make sure you get enough calcium and vitamin D while you are taking this medicine, unless your doctor tells you not to. Discuss the foods you eat and the vitamins you take with your health care professional. See your dentist regularly. Brush and floss your teeth as directed. Before you have any dental work done, tell your dentist you are receiving this medicine. Do not become pregnant while taking this medicine or for 5 months after stopping  it. Talk with your doctor or health care professional about your birth control options while taking this medicine. Women should inform their doctor if they wish to become pregnant or think they might be pregnant. There is a potential for serious side effects to an unborn child. Talk  to your health care professional or pharmacist for more information. What side effects may I notice from receiving this medicine? Side effects that you should report to your doctor or health care professional as soon as possible: -allergic reactions like skin rash, itching or hives, swelling of the face, lips, or tongue -bone pain -breathing problems -dizziness -jaw pain, especially after dental work -redness, blistering, peeling of the skin -signs and symptoms of infection like fever or chills; cough; sore throat; pain or trouble passing urine -signs of low calcium like fast heartbeat, muscle cramps or muscle pain; pain, tingling, numbness in the hands or feet; seizures -unusual bleeding or bruising -unusually weak or tired Side effects that usually do not require medical attention (report to your doctor or health care professional if they continue or are bothersome): -constipation -diarrhea -headache -joint pain -loss of appetite -muscle pain -runny nose -tiredness -upset stomach This list may not describe all possible side effects. Call your doctor for medical advice about side effects. You may report side effects to FDA at 1-800-FDA-1088. Where should I keep my medicine? This medicine is only given in a clinic, doctor's office, or other health care setting and will not be stored at home. NOTE: This sheet is a summary. It may not cover all possible information. If you have questions about this medicine, talk to your doctor, pharmacist, or health care provider.  2018 Elsevier/Gold Standard (2016-12-03 19:17:21)   Fulvestrant injection What is this medicine? FULVESTRANT (ful VES trant) blocks the effects of estrogen. It is used to treat breast cancer. This medicine may be used for other purposes; ask your health care provider or pharmacist if you have questions. COMMON BRAND NAME(S): FASLODEX What should I tell my health care provider before I take this medicine? They need to  know if you have any of these conditions: -bleeding problems -liver disease -low levels of platelets in the blood -an unusual or allergic reaction to fulvestrant, other medicines, foods, dyes, or preservatives -pregnant or trying to get pregnant -breast-feeding How should I use this medicine? This medicine is for injection into a muscle. It is usually given by a health care professional in a hospital or clinic setting. Talk to your pediatrician regarding the use of this medicine in children. Special care may be needed. Overdosage: If you think you have taken too much of this medicine contact a poison control center or emergency room at once. NOTE: This medicine is only for you. Do not share this medicine with others. What if I miss a dose? It is important not to miss your dose. Call your doctor or health care professional if you are unable to keep an appointment. What may interact with this medicine? -medicines that treat or prevent blood clots like warfarin, enoxaparin, and dalteparin This list may not describe all possible interactions. Give your health care provider a list of all the medicines, herbs, non-prescription drugs, or dietary supplements you use. Also tell them if you smoke, drink alcohol, or use illegal drugs. Some items may interact with your medicine. What should I watch for while using this medicine? Your condition will be monitored carefully while you are receiving this medicine. You will need important blood work done while you   this medicine. Do not become pregnant while taking this medicine or for at least 1 year after stopping it. Women of child-bearing potential will need to have a negative pregnancy test before starting this medicine. Women should inform their doctor if they wish to become pregnant or think they might be pregnant. There is a potential for serious side effects to an unborn child. Men should inform their doctors if they wish to father a child. This  medicine may lower sperm counts. Talk to your health care professional or pharmacist for more information. Do not breast-feed an infant while taking this medicine or for 1 year after the last dose. What side effects may I notice from receiving this medicine? Side effects that you should report to your doctor or health care professional as soon as possible: -allergic reactions like skin rash, itching or hives, swelling of the face, lips, or tongue -feeling faint or lightheaded, falls -pain, tingling, numbness, or weakness in the legs -signs and symptoms of infection like fever or chills; cough; flu-like symptoms; sore throat -vaginal bleeding Side effects that usually do not require medical attention (report to your doctor or health care professional if they continue or are bothersome): -aches, pains -constipation -diarrhea -headache -hot flashes -nausea, vomiting -pain at site where injected -stomach pain This list may not describe all possible side effects. Call your doctor for medical advice about side effects. You may report side effects to FDA at 1-800-FDA-1088. Where should I keep my medicine? This drug is given in a hospital or clinic and will not be stored at home. NOTE: This sheet is a summary. It may not cover all possible information. If you have questions about this medicine, talk to your doctor, pharmacist, or health care provider.  2018 Elsevier/Gold Standard (2015-06-09 11:03:55)

## 2018-01-12 NOTE — Assessment & Plan Note (Signed)
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 She had radiation therapy and has been on antiestrogen therapy with Arimidex since 06/28/2014  CT chest 12/21/2017: Multiple bilateral lung nodules, bilateral mediastinal nodes, multiple thoracic vertebral metastases, left pleural effusion, liver lesions  Thoracentesis: Met Breast cancer ER PR Positive and Her 2 Neg PET/CT scan. 01/04/18: New malignant Pleural effusion with small bil pulm nodules, new mild bilateral mediastinal hilar portocaval lymph node metastases, new liver metastases, new diffuse bone metastases.  Current treatment: Ibrance with Faslodex Ibrance toxicities:  Lab review: Return to clinic monthly for Xgeva, Faslodex injections and lab work checked.

## 2018-01-12 NOTE — Telephone Encounter (Signed)
Gave avs and calendar for march

## 2018-01-14 ENCOUNTER — Encounter: Payer: Self-pay | Admitting: Hematology and Oncology

## 2018-01-14 NOTE — Progress Notes (Signed)
Rutha Bouchard [facility] - Rx #: [id number] Need help? Call us at [phone]  Outcome  Cancelledtoday  this is a dup see VHWLTP . has not been 60 days and this is not an appeal. Adv provider of this  DrugOndansetron 8MG  OR TBDP  Frederico Hamman [place] Medicare Part D General Authorization Form  Original Claim [id number] MD CALL [phone] B/D DETERMINATION REQUIRED; MD [phone]

## 2018-01-14 NOTE — Progress Notes (Signed)
Received PA request for Ondansetron.  Submitted via Cover My Meds:  Wendy [name] (Key: [id number])   Your information has been submitted to [place]. [employer] [place] will review the request and notify you of the determination decision directly, typically within 3 business days of your submission and once all necessary information is received. You will also receive your request decision electronically. To check for an update later, open the request again from your dashboard. If [employer] [place] has not responded within the specified timeframe or if you have any questions about your PA submission, contact [place]  directly at [facility]) [phone] or ([place]) [phone].

## 2018-01-23 ENCOUNTER — Other Ambulatory Visit: Payer: Self-pay | Admitting: *Deleted

## 2018-01-23 DIAGNOSIS — C50412 Malignant neoplasm of upper-outer quadrant of left female breast: Secondary | ICD-10-CM

## 2018-01-23 DIAGNOSIS — Z170 Estrogen receptor positive status [ER+]: Principal | ICD-10-CM

## 2018-01-26 ENCOUNTER — Inpatient Hospital Stay: Payer: Medicare Other | Attending: Hematology and Oncology

## 2018-01-26 ENCOUNTER — Inpatient Hospital Stay: Payer: Medicare Other

## 2018-01-26 ENCOUNTER — Inpatient Hospital Stay (HOSPITAL_BASED_OUTPATIENT_CLINIC_OR_DEPARTMENT_OTHER): Payer: Medicare Other | Admitting: Hematology and Oncology

## 2018-01-26 ENCOUNTER — Telehealth: Payer: Self-pay | Admitting: Hematology and Oncology

## 2018-01-26 DIAGNOSIS — C50412 Malignant neoplasm of upper-outer quadrant of left female breast: Secondary | ICD-10-CM | POA: Insufficient documentation

## 2018-01-26 DIAGNOSIS — C7951 Secondary malignant neoplasm of bone: Secondary | ICD-10-CM

## 2018-01-26 DIAGNOSIS — Z170 Estrogen receptor positive status [ER+]: Secondary | ICD-10-CM | POA: Diagnosis not present

## 2018-01-26 DIAGNOSIS — C7801 Secondary malignant neoplasm of right lung: Secondary | ICD-10-CM | POA: Diagnosis not present

## 2018-01-26 DIAGNOSIS — C7800 Secondary malignant neoplasm of unspecified lung: Secondary | ICD-10-CM

## 2018-01-26 DIAGNOSIS — Z5111 Encounter for antineoplastic chemotherapy: Secondary | ICD-10-CM | POA: Insufficient documentation

## 2018-01-26 DIAGNOSIS — C7802 Secondary malignant neoplasm of left lung: Secondary | ICD-10-CM | POA: Insufficient documentation

## 2018-01-26 LAB — CMP (CANCER CENTER ONLY)
ALT: 11 U/L (ref 0–55)
AST: 16 U/L (ref 5–34)
Albumin: 3.2 g/dL — ABNORMAL LOW (ref 3.5–5.0)
Alkaline Phosphatase: 65 U/L (ref 40–150)
Anion gap: 14 — ABNORMAL HIGH (ref 3–11)
BUN: 31 mg/dL — ABNORMAL HIGH (ref 7–26)
CO2: 23 mmol/L (ref 22–29)
Calcium: 9.2 mg/dL (ref 8.4–10.4)
Chloride: 104 mmol/L (ref 98–109)
Creatinine: 1.61 mg/dL — ABNORMAL HIGH (ref 0.60–1.10)
GFR, Est AFR Am: 35 mL/min — ABNORMAL LOW (ref >60)
GFR, Estimated: 30 mL/min — ABNORMAL LOW (ref >60)
Glucose, Bld: 130 mg/dL (ref 70–140)
Potassium: 3.4 mmol/L — ABNORMAL LOW (ref 3.5–5.1)
Sodium: 141 mmol/L (ref 136–145)
Total Bilirubin: 0.5 mg/dL (ref 0.2–1.2)
Total Protein: 6.9 g/dL (ref 6.4–8.3)

## 2018-01-26 LAB — CBC WITH DIFFERENTIAL (CANCER CENTER ONLY)
Basophils Absolute: 0.1 10*3/uL (ref 0.0–0.1)
Basophils Relative: 2 %
Eosinophils Absolute: 0.1 10*3/uL (ref 0.0–0.5)
Eosinophils Relative: 2 %
HCT: 31.6 % — ABNORMAL LOW (ref 34.8–46.6)
Hemoglobin: 10.2 g/dL — ABNORMAL LOW (ref 11.6–15.9)
Lymphocytes Relative: 29 %
Lymphs Abs: 0.7 10*3/uL — ABNORMAL LOW (ref 0.9–3.3)
MCH: 29 pg (ref 25.1–34.0)
MCHC: 32.3 g/dL (ref 31.5–36.0)
MCV: 89.8 fL (ref 79.5–101.0)
Monocytes Absolute: 0.5 10*3/uL (ref 0.1–0.9)
Monocytes Relative: 22 %
Neutro Abs: 1.1 10*3/uL — ABNORMAL LOW (ref 1.5–6.5)
Neutrophils Relative %: 45 %
Platelet Count: 234 10*3/uL (ref 145–400)
RBC: 3.52 MIL/uL — ABNORMAL LOW (ref 3.70–5.45)
RDW: 19.6 % — ABNORMAL HIGH (ref 11.2–14.5)
WBC Count: 2.5 10*3/uL — ABNORMAL LOW (ref 3.9–10.3)

## 2018-01-26 MED ORDER — FULVESTRANT 250 MG/5ML IM SOLN
500.0000 mg | Freq: Once | INTRAMUSCULAR | Status: AC
  Administered 2018-01-26: 500 mg via INTRAMUSCULAR

## 2018-01-26 MED ORDER — FULVESTRANT 250 MG/5ML IM SOLN
INTRAMUSCULAR | Status: AC
  Filled 2018-01-26: qty 5

## 2018-01-26 MED FILL — IBRANCE 125 MG CAPSULE: 125 | 28 days supply | Qty: 21 | Fill #1

## 2018-01-26 NOTE — Telephone Encounter (Signed)
Gave avs and calendar for april °

## 2018-01-26 NOTE — Assessment & Plan Note (Signed)
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 She had radiation therapy and has been on antiestrogen therapy with Arimidex 06/28/2014-January 2019  CT chest 12/21/2017: Multiple bilateral lung nodules, bilateral mediastinal nodes, multiple thoracic vertebral metastases, left pleural effusion, liver lesions  Thoracentesis: Met Breast cancer ER PR Positive and Her 2 Neg PET/CT scan. 01/04/18: New malignant Pleural effusion with small bil pulm nodules, new mild bilateral mediastinal hilar portocaval lymph node metastases, new liver metastases, new diffuse bone metastases.  Current treatment: Ibrance with Faslodex Ibrance toxicities: Fatigue is a primary toxicity  Lab review: ANC 1.2.  I discussed with her that as long as [place] is more than 1000, she can continue on the same dosage. Return to clinic in 1 month for follow-up.

## 2018-01-26 NOTE — Progress Notes (Signed)
Patient Care Team: [name], [place] At as PCP - General (Family Medicine)  DIAGNOSIS:  Encounter Diagnosis  Name Primary?  . Malignant neoplasm of upper-outer quadrant of left breast in female, estrogen receptor positive ([place])     SUMMARY OF ONCOLOGIC HISTORY:   Malignant neoplasm of upper-outer quadrant of left breast in female, estrogen receptor positive ([place])   07/28/2013 Mammogram    Spiculated mass at 6:00 position 3 cm, ultrasound revealed 2.6 x 2.1 x 1.5 cm second mass at 12:00 2.1 x 1.8 x 2.1 cm: 2 abnormal lymph nodes left axilla      10/29/2013 Initial Diagnosis    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 [name], T3N2 (5/10 LN positive) stage IIIa IDC grade 1, ER/PR 100%, TSH 14% HER-2 negative PET/CT 12/20/2013 negative for metastases      12/17/2013 - 04/02/2014 Chemotherapy    Taxotere Cytoxan every 3 weeks for 6 cycles      05/11/2014 - 06/27/2014 Radiation Therapy    Adjuvant radiation therapy to chest wall and lymph nodes      07/06/2014 -  Anti-estrogen oral therapy    Arimidex 1 mg daily      12/21/2017 Imaging    Metastatic lesions noted in the lungs bilaterally, bilateral hilar and mediastinal lymph nodes, left pleural effusion, multiple thoracic vertebral metastases, heterogeneous lesions in the liver      12/22/2017 Procedure    Pleural Effusion Thoracentesis: Metastatic Breast cancer ER PR Positive Her 2 Neg       CHIEF COMPLIANT: Follow-up of Ibrance with Faslodex  INTERVAL HISTORY: Wendy [name] is a 77 year old with above-mentioned history metastatic breast cancer currently on Faslodex and Ibrance.  She is continued to have nausea and vomiting issues.  She has lost another 3 pounds.  Denies any fevers or chills.  She does have muscle stiffness and achiness.  REVIEW OF SYSTEMS:     Constitutional: Denies fevers, chills or abnormal weight loss Eyes: Denies blurriness of vision Ears, nose, mouth, throat, and face: Denies mucositis or sore throat Respiratory: Denies cough, dyspnea or wheezes Cardiovascular: Denies palpitation, chest discomfort Gastrointestinal:  Denies nausea, heartburn or change in bowel habits Skin: Denies abnormal skin rashes Lymphatics: Denies new lymphadenopathy or easy bruising Neurological:Denies numbness, tingling or new weaknesses Behavioral/Psych: Mood is stable, no new changes  Extremities: No lower extremity edema Breast:  denies any pain or lumps or nodules in either breasts All other systems were reviewed with the patient and are negative.  I have reviewed the past medical history, past surgical history, social history and family history with the patient and they are unchanged from previous note.  ALLERGIES:  has No Known Allergies.  MEDICATIONS:  Current Outpatient Medications  Medication Sig Dispense Refill  . amoxicillin (AMOXIL) 500 MG capsule Take 1 capsule (500 mg total) by mouth 3 (three) times daily. 21 capsule 0  . aspirin EC 81 MG tablet Take 81 mg by mouth daily.    [name] atorvastatin (LIPITOR) 40 MG tablet Take 20 mg by mouth at bedtime.     [name] CALCIUM PO Take 1 tablet by mouth daily.    . Multiple Vitamin (MULTIVITAMIN WITH MINERALS) TABS tablet Take 1 tablet by mouth daily.    [name] NITROSTAT 0.4 MG SL tablet Place 0.4 mg under the tongue every 5 (five) minutes as needed  for chest pain.     [name] omega-3 acid ethyl esters (LOVAZA) 1 G capsule Take 1 g by mouth daily.    . ondansetron (ZOFRAN ODT) 8 MG disintegrating tablet Take 1 tablet (8 mg total) by mouth every 8 (eight) hours as needed for nausea or vomiting. 30 tablet 6  . oxyCODONE-acetaminophen (PERCOCET) 5-325 MG tablet Take 2 tablets by mouth every 4 (four) hours as needed. 30 tablet 0  . oxyCODONE-acetaminophen (PERCOCET/ROXICET) 5-325 MG tablet Take 1-2 tablets by mouth  every 8 (eight) hours as needed for severe pain. 6 tablet 0  . palbociclib (IBRANCE) 125 MG capsule Take 1 capsule (125 mg total) by mouth daily with breakfast. Take for 21 days on, 7 days off. Take whole with food. 21 capsule 3  . valsartan-hydrochlorothiazide (DIOVAN HCT) 160-12.5 MG tablet Take 1 tablet by mouth daily. 1/2 tab twice daily (Patient taking differently: Take 0.5 tablets by mouth at bedtime. )     No current facility-administered medications for this visit.     PHYSICAL EXAMINATION: ECOG PERFORMANCE STATUS: 1 - Symptomatic but completely ambulatory  There were no vitals filed for this visit. There were no vitals filed for this visit.  GENERAL:alert, no distress and comfortable SKIN: skin color, texture, turgor are normal, no rashes or significant lesions EYES: normal, Conjunctiva are pink and non-injected, sclera clear OROPHARYNX:no exudate, no erythema and lips, buccal mucosa, and tongue normal  NECK: supple, thyroid normal size, non-tender, without nodularity LYMPH:  no palpable lymphadenopathy in the cervical, axillary or inguinal LUNGS: clear to auscultation and percussion with normal breathing effort HEART: regular rate & rhythm and no murmurs and no lower extremity edema ABDOMEN:abdomen soft, non-tender and normal bowel sounds MUSCULOSKELETAL:no cyanosis of digits and no clubbing  NEURO: alert & oriented x 3 with fluent speech, no focal motor/sensory deficits EXTREMITIES: No lower extremity edema  LABORATORY DATA:  I have reviewed the data as listed CMP Latest Ref Rng & Units 01/12/2018 12/22/2017 12/21/2017  Glucose 70 - 140 mg/dL 92 108 114(H)  BUN 7 - 26 mg/dL 25 18 21(H)  Creatinine 0.60 - 1.10 mg/dL 1.24(H) 1.04 1.11(H)  Sodium 136 - 145 mmol/L 139 140 141  Potassium 3.5 - 5.1 mmol/L 3.9 3.6 3.5  Chloride 98 - 109 mmol/L 102 103 105  CO2 22 - 29 mmol/L _0 Calcium 8.4 - 10.4 mg/dL 10.5(H) 9.7 9.7  Total Protein 6.4 - 8.3 g/dL 7.2 6.6 -  Total  Bilirubin 0.2 - 1.2 mg/dL 0.6 0.3 -  Alkaline Phos 40 - 150 U/L 74 95 -  AST 5 - 34 U/L 18 24 -  ALT 0 - 55 U/L 9 16 -    Lab Results  Component Value Date   WBC 2.5 (L) 01/26/2018   HGB 11.0 (L) 12/21/2017   HCT 31.6 (L) 01/26/2018   MCV 89.8 01/26/2018   PLT 234 01/26/2018   NEUTROABS 1.1 (L) 01/26/2018    ASSESSMENT & PLAN:  Malignant neoplasm of upper-outer quadrant of left breast in female, 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 She had radiation therapy and has been on antiestrogen therapy with Arimidex 06/28/2014-January 2019  CT chest 12/21/2017: Multiple bilateral lung nodules, bilateral mediastinal nodes, multiple thoracic vertebral metastases, left pleural effusion, liver lesions  Thoracentesis: Met Breast cancer ER PR Positive and Her  2 Neg PET/CT scan. 01/04/18: New malignant Pleural effusion with small bil pulm nodules, new mild bilateral mediastinal hilar portocaval lymph node metastases, new liver metastases, new diffuse bone metastases.  Current treatment: Ibrance with Faslodex Ibrance toxicities: Fatigue is a primary toxicity Nausea vomiting: She takes Zofran and Phenergan. Weight loss: Patient is not able to eat or drink well because of the nausea and vomiting. If she continues to lose weight then we may have to reduce the dosage of Ibrance for the next cycle.  Faslodex toxicities: Joint and muscle aches.  These seem to get better with activity.  Lab review: ANC 1.1.  I discussed with her that as long as [place] is more than 1000, she can continue on the same dosage. Return to clinic in 1 month for follow-up.  I spent 25 minutes talking to the patient of which more than half was spent in counseling and coordination of care.  No orders of the defined types were placed in  this encounter.  The patient has a good understanding of the overall plan. she agrees with it. she will call with any problems that may develop before the next visit here.   Harriette Ohara, MD 01/26/18

## 2018-02-02 ENCOUNTER — Telehealth: Payer: Self-pay

## 2018-02-02 NOTE — Telephone Encounter (Signed)
Received VM from patient regarding needing a letter for work. Attempted to return pt call and went to VM. Left VM for pt stating I need to know what exactly the letter needs to say. Call back number left.  Cyndia Bent RN

## 2018-02-03 ENCOUNTER — Telehealth: Payer: Self-pay

## 2018-02-03 NOTE — Telephone Encounter (Signed)
Pt called to request a letter to be sent to her employer to update on when pt is able to go back to work. Pt provided information about her employer. Told pt that it may take a few days to send and fax out the letter but it will be sent out this week. Pt states this is okay and is very appreciative of call. 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 Pt has not further concerns at this time. Will call in the future if she has any more concerns.

## 2018-02-20 DIAGNOSIS — E663 Overweight: Secondary | ICD-10-CM | POA: Diagnosis not present

## 2018-02-20 DIAGNOSIS — C50112 Malignant neoplasm of central portion of left female breast: Secondary | ICD-10-CM | POA: Diagnosis not present

## 2018-02-20 DIAGNOSIS — Z955 Presence of coronary angioplasty implant and graft: Secondary | ICD-10-CM | POA: Diagnosis not present

## 2018-02-20 DIAGNOSIS — I10 Essential (primary) hypertension: Secondary | ICD-10-CM | POA: Diagnosis not present

## 2018-02-20 DIAGNOSIS — I2510 Atherosclerotic heart disease of native coronary artery without angina pectoris: Secondary | ICD-10-CM | POA: Diagnosis not present

## 2018-02-20 DIAGNOSIS — E785 Hyperlipidemia, unspecified: Secondary | ICD-10-CM | POA: Diagnosis not present

## 2018-02-20 DIAGNOSIS — I6529 Occlusion and stenosis of unspecified carotid artery: Secondary | ICD-10-CM | POA: Diagnosis not present

## 2018-02-23 ENCOUNTER — Inpatient Hospital Stay: Payer: Medicare Other | Attending: Hematology and Oncology

## 2018-02-23 ENCOUNTER — Inpatient Hospital Stay: Payer: Medicare Other

## 2018-02-23 ENCOUNTER — Telehealth: Payer: Self-pay | Admitting: Hematology and Oncology

## 2018-02-23 ENCOUNTER — Inpatient Hospital Stay (HOSPITAL_BASED_OUTPATIENT_CLINIC_OR_DEPARTMENT_OTHER): Payer: Medicare Other | Admitting: Hematology and Oncology

## 2018-02-23 DIAGNOSIS — Z79811 Long term (current) use of aromatase inhibitors: Secondary | ICD-10-CM | POA: Insufficient documentation

## 2018-02-23 DIAGNOSIS — C7800 Secondary malignant neoplasm of unspecified lung: Secondary | ICD-10-CM

## 2018-02-23 DIAGNOSIS — R5383 Other fatigue: Secondary | ICD-10-CM

## 2018-02-23 DIAGNOSIS — C778 Secondary and unspecified malignant neoplasm of lymph nodes of multiple regions: Secondary | ICD-10-CM | POA: Insufficient documentation

## 2018-02-23 DIAGNOSIS — R112 Nausea with vomiting, unspecified: Secondary | ICD-10-CM | POA: Insufficient documentation

## 2018-02-23 DIAGNOSIS — C7801 Secondary malignant neoplasm of right lung: Secondary | ICD-10-CM | POA: Insufficient documentation

## 2018-02-23 DIAGNOSIS — J910 Malignant pleural effusion: Secondary | ICD-10-CM

## 2018-02-23 DIAGNOSIS — Z170 Estrogen receptor positive status [ER+]: Principal | ICD-10-CM

## 2018-02-23 DIAGNOSIS — C787 Secondary malignant neoplasm of liver and intrahepatic bile duct: Secondary | ICD-10-CM | POA: Insufficient documentation

## 2018-02-23 DIAGNOSIS — C7802 Secondary malignant neoplasm of left lung: Secondary | ICD-10-CM | POA: Diagnosis not present

## 2018-02-23 DIAGNOSIS — Z9012 Acquired absence of left breast and nipple: Secondary | ICD-10-CM | POA: Diagnosis not present

## 2018-02-23 DIAGNOSIS — Z5111 Encounter for antineoplastic chemotherapy: Secondary | ICD-10-CM | POA: Diagnosis not present

## 2018-02-23 DIAGNOSIS — C7951 Secondary malignant neoplasm of bone: Secondary | ICD-10-CM | POA: Insufficient documentation

## 2018-02-23 DIAGNOSIS — Z9221 Personal history of antineoplastic chemotherapy: Secondary | ICD-10-CM | POA: Insufficient documentation

## 2018-02-23 DIAGNOSIS — Z923 Personal history of irradiation: Secondary | ICD-10-CM | POA: Insufficient documentation

## 2018-02-23 DIAGNOSIS — C50412 Malignant neoplasm of upper-outer quadrant of left female breast: Secondary | ICD-10-CM

## 2018-02-23 LAB — CMP (CANCER CENTER ONLY)
ALT: 12 U/L (ref 0–55)
AST: 18 U/L (ref 5–34)
Albumin: 3.3 g/dL — ABNORMAL LOW (ref 3.5–5.0)
Alkaline Phosphatase: 65 U/L (ref 40–150)
Anion gap: 9 (ref 3–11)
BUN: 22 mg/dL (ref 7–26)
CO2: 26 mmol/L (ref 22–29)
Calcium: 9.8 mg/dL (ref 8.4–10.4)
Chloride: 101 mmol/L (ref 98–109)
Creatinine: 1.21 mg/dL — ABNORMAL HIGH (ref 0.60–1.10)
GFR, Est AFR Am: 49 mL/min — ABNORMAL LOW (ref >60)
GFR, Estimated: 42 mL/min — ABNORMAL LOW (ref >60)
Glucose, Bld: 99 mg/dL (ref 70–140)
Potassium: 4.1 mmol/L (ref 3.5–5.1)
Sodium: 136 mmol/L (ref 136–145)
Total Bilirubin: 0.4 mg/dL (ref 0.2–1.2)
Total Protein: 7.1 g/dL (ref 6.4–8.3)

## 2018-02-23 LAB — CBC WITH DIFFERENTIAL (CANCER CENTER ONLY)
Basophils Absolute: 0.1 10*3/uL (ref 0.0–0.1)
Basophils Relative: 3 %
Eosinophils Absolute: 0.1 10*3/uL (ref 0.0–0.5)
Eosinophils Relative: 3 %
HCT: 29.9 % — ABNORMAL LOW (ref 34.8–46.6)
Hemoglobin: 10 g/dL — ABNORMAL LOW (ref 11.6–15.9)
Lymphocytes Relative: 22 %
Lymphs Abs: 0.7 10*3/uL — ABNORMAL LOW (ref 0.9–3.3)
MCH: 30.3 pg (ref 25.1–34.0)
MCHC: 33.4 g/dL (ref 31.5–36.0)
MCV: 90.8 fL (ref 79.5–101.0)
Monocytes Absolute: 0.7 10*3/uL (ref 0.1–0.9)
Monocytes Relative: 22 %
Neutro Abs: 1.6 10*3/uL (ref 1.5–6.5)
Neutrophils Relative %: 50 %
Platelet Count: 299 10*3/uL (ref 145–400)
RBC: 3.29 MIL/uL — ABNORMAL LOW (ref 3.70–5.45)
RDW: 24.6 % — ABNORMAL HIGH (ref 11.2–14.5)
WBC Count: 3.3 10*3/uL — ABNORMAL LOW (ref 3.9–10.3)

## 2018-02-23 MED ORDER — DENOSUMAB 120 MG/1.7ML ~~LOC~~ SOLN
SUBCUTANEOUS | Status: AC
  Filled 2018-02-23: qty 1.7

## 2018-02-23 MED ORDER — FULVESTRANT 250 MG/5ML IM SOLN
500.0000 mg | Freq: Once | INTRAMUSCULAR | Status: AC
  Administered 2018-02-23: 500 mg via INTRAMUSCULAR

## 2018-02-23 MED ORDER — DENOSUMAB 120 MG/1.7ML ~~LOC~~ SOLN
120.0000 mg | Freq: Once | SUBCUTANEOUS | Status: AC
  Administered 2018-02-23: 120 mg via SUBCUTANEOUS

## 2018-02-23 MED FILL — IBRANCE 125 MG CAPSULE: 125 | 28 days supply | Qty: 21 | Fill #2

## 2018-02-23 NOTE — Telephone Encounter (Signed)
Gave patient AVs and calendar of upcoming April and may appointments.  °

## 2018-02-23 NOTE — Progress Notes (Signed)
Patient Care Team: [name], [place] At as PCP - General (Family Medicine)  DIAGNOSIS:  Encounter Diagnosis  Name Primary?  . Malignant neoplasm of upper-outer quadrant of left breast in female, estrogen receptor positive ([place])     SUMMARY OF ONCOLOGIC HISTORY:   Malignant neoplasm of upper-outer quadrant of left breast in female, estrogen receptor positive ([place])   07/28/2013 Mammogram    Spiculated mass at 6:00 position 3 cm, ultrasound revealed 2.6 x 2.1 x 1.5 cm second mass at 12:00 2.1 x 1.8 x 2.1 cm: 2 abnormal lymph nodes left axilla      10/29/2013 Initial Diagnosis    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 [name], T3N2 (5/10 LN positive) stage IIIa IDC grade 1, ER/PR 100%, TSH 14% HER-2 negative PET/CT 12/20/2013 negative for metastases      12/17/2013 - 04/02/2014 Chemotherapy    Taxotere Cytoxan every 3 weeks for 6 cycles      05/11/2014 - 06/27/2014 Radiation Therapy    Adjuvant radiation therapy to chest wall and lymph nodes      07/06/2014 -  Anti-estrogen oral therapy    Arimidex 1 mg daily      12/21/2017 Imaging    Metastatic lesions noted in the lungs bilaterally, bilateral hilar and mediastinal lymph nodes, left pleural effusion, multiple thoracic vertebral metastases, heterogeneous lesions in the liver      12/22/2017 Procedure    Pleural Effusion Thoracentesis: Metastatic Breast cancer ER PR Positive Her 2 Neg       CHIEF COMPLIANT: Ibrance with Faslodex  INTERVAL HISTORY: Wendy [name] is a 63-year with above-mentioned history metastatic breast cancer with lung metastases with pleural effusion.  She is currently on Ibrance with Faslodex and appears to be tolerating it fairly well.  She does complain of fatigue.  Denies any nausea vomiting.  REVIEW OF SYSTEMS:   Constitutional: Denies  fevers, chills or abnormal weight loss Eyes: Denies blurriness of vision Ears, nose, mouth, throat, and face: Denies mucositis or sore throat Respiratory: Denies cough, dyspnea or wheezes Cardiovascular: Denies palpitation, chest discomfort Gastrointestinal:  Denies nausea, heartburn or change in bowel habits Skin: Denies abnormal skin rashes Lymphatics: Denies new lymphadenopathy or easy bruising Neurological:Denies numbness, tingling or new weaknesses Behavioral/Psych: Mood is stable, no new changes  Extremities: No lower extremity edema All other systems were reviewed with the patient and are negative.  I have reviewed the past medical history, past surgical history, social history and family history with the patient and they are unchanged from previous note.  ALLERGIES:  has No Known Allergies.  MEDICATIONS:  Current Outpatient Medications  Medication Sig Dispense Refill  . amoxicillin (AMOXIL) 500 MG capsule Take 1 capsule (500 mg total) by mouth 3 (three) times daily. 21 capsule 0  . aspirin EC 81 MG tablet Take 81 mg by mouth daily.    [name] atorvastatin (LIPITOR) 40 MG tablet Take 20 mg by mouth at bedtime.     [name] CALCIUM PO Take 1 tablet by mouth daily.    . Multiple Vitamin (MULTIVITAMIN WITH MINERALS) TABS tablet Take 1 tablet by mouth daily.    [name] NITROSTAT 0.4 MG SL tablet Place 0.4 mg under the tongue every 5 (five) minutes as needed for chest pain.     [name] omega-3 acid ethyl esters (LOVAZA) 1 G capsule Take 1  g by mouth daily.    . ondansetron (ZOFRAN ODT) 8 MG disintegrating tablet Take 1 tablet (8 mg total) by mouth every 8 (eight) hours as needed for nausea or vomiting. 30 tablet 6  . oxyCODONE-acetaminophen (PERCOCET) 5-325 MG tablet Take 2 tablets by mouth every 4 (four) hours as needed. 30 tablet 0  . oxyCODONE-acetaminophen (PERCOCET/ROXICET) 5-325 MG tablet Take 1-2 tablets by mouth every 8 (eight) hours as needed for severe pain. 6 tablet 0  . palbociclib (IBRANCE) 125  MG capsule Take 1 capsule (125 mg total) by mouth daily with breakfast. Take for 21 days on, 7 days off. Take whole with food. 21 capsule 3  . valsartan-hydrochlorothiazide (DIOVAN HCT) 160-12.5 MG tablet Take 1 tablet by mouth daily. 1/2 tab twice daily (Patient taking differently: Take 0.5 tablets by mouth at bedtime. )     No current facility-administered medications for this visit.     PHYSICAL EXAMINATION: ECOG PERFORMANCE STATUS: 1 - Symptomatic but completely ambulatory  Vitals:   02/23/18 0933  BP: 117/65  Pulse: 83  Resp: 17  Temp: (!) 97.4 F (36.3 C)  SpO2: 100%   Filed Weights   02/23/18 0933  Weight: 159 lb 4.8 oz (72.3 kg)    GENERAL:alert, no distress and comfortable SKIN: skin color, texture, turgor are normal, no rashes or significant lesions EYES: normal, Conjunctiva are pink and non-injected, sclera clear OROPHARYNX:no exudate, no erythema and lips, buccal mucosa, and tongue normal  NECK: supple, thyroid normal size, non-tender, without nodularity LYMPH:  no palpable lymphadenopathy in the cervical, axillary or inguinal LUNGS: clear to auscultation and percussion with normal breathing effort HEART: regular rate & rhythm and no murmurs and no lower extremity edema ABDOMEN:abdomen soft, non-tender and normal bowel sounds MUSCULOSKELETAL:no cyanosis of digits and no clubbing  NEURO: alert & oriented x 3 with fluent speech, no focal motor/sensory deficits EXTREMITIES: No lower extremity edema  LABORATORY DATA:  I have reviewed the data as listed CMP Latest Ref Rng & Units 01/26/2018 01/12/2018 12/22/2017  Glucose 70 - 140 mg/dL 130 92 108  BUN 7 - 26 mg/dL 31(H) 25 18  Creatinine 0.60 - 1.10 mg/dL 1.61(H) 1.24(H) 1.04  Sodium 136 - 145 mmol/L 141 139 140  Potassium 3.5 - 5.1 mmol/L 3.4(L) 3.9 3.6  Chloride 98 - 109 mmol/L 104 102 103  CO2 22 - 29 mmol/L 23 27 27   Calcium 8.4 - 10.4 mg/dL 9.2 10.5(H) 9.7  Total Protein 6.4 - 8.3 g/dL 6.9 7.2 6.6  Total  Bilirubin 0.2 - 1.2 mg/dL 0.5 0.6 0.3  Alkaline Phos 40 - 150 U/L 65 74 95  AST 5 - 34 U/L 16 18 24   ALT 0 - 55 U/L 11 9 16     Lab Results  Component Value Date   WBC 3.3 (L) 02/23/2018   HGB 11.0 (L) 12/21/2017   HCT 29.9 (L) 02/23/2018   MCV 90.8 02/23/2018   PLT 299 02/23/2018   NEUTROABS 1.6 02/23/2018    ASSESSMENT & PLAN:  Malignant neoplasm of upper-outer quadrant of left breast in female, 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 She had radiation therapy and has been on antiestrogen therapy with Arimidex 06/28/2014-January 2019  CT chest 12/21/2017: Multiple bilateral lung nodules, bilateral mediastinal nodes, multiple thoracic vertebral metastases, left pleural effusion, liver lesions  Thoracentesis: Met  Breast cancer ER PR Positive and Her 2 Neg PET/CT scan. 01/04/18: New malignant Pleural effusion with small bil pulm nodules,new mild bilateral mediastinal hilar portocaval lymph node metastases, new liver metastases, new diffuse bone metastases.  Current treatment: Ibrance with Faslodex started 12/29/2017 Ibrance toxicities: Fatigue is a primary toxicity Nausea vomiting: She takes Zofran and Phenergan. Weight loss: Patient is not able to eat or drink well because of the nausea and vomiting. Patient continues to lose weight because of decreased appetite.  She tried CBD oil but she threw up.  She is going to try to gain.  If not we may consider giving her Megace.  Faslodex toxicities: Joint and muscle aches.  These seem to get better with activity.  Lab review: ANC 1.1. I discussed with her that as long as [place] is more than 1000, she can continue on the same dosage. Return to clinic in 1 month for follow-up after scans.   No orders of the defined types were placed in this  encounter.  The patient has a good understanding of the overall plan. she agrees with it. she will call with any problems that may develop before the next visit here.   Harriette Ohara, MD 02/23/18

## 2018-02-23 NOTE — Assessment & Plan Note (Signed)
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 She had radiation therapy and has been on antiestrogen therapy with Arimidex 06/28/2014-January 2019  CT chest 12/21/2017: Multiple bilateral lung nodules, bilateral mediastinal nodes, multiple thoracic vertebral metastases, left pleural effusion, liver lesions  Thoracentesis: Met Breast cancer ER PR Positive and Her 2 Neg PET/CT scan. 01/04/18: New malignant Pleural effusion with small bil pulm nodules,new mild bilateral mediastinal hilar portocaval lymph node metastases, new liver metastases, new diffuse bone metastases.  Current treatment: Ibrance with Faslodex started 12/29/2017 Ibrance toxicities: Fatigue is a primary toxicity Nausea vomiting: She takes Zofran and Phenergan. Weight loss: Patient is not able to eat or drink well because of the nausea and vomiting. If she continues to lose weight then we may have to reduce the dosage of Ibrance for the next cycle.  Faslodex toxicities: Joint and muscle aches.  These seem to get better with activity.  Lab review: ANC 1.1. I discussed with her that as long as [place] is more than 1000, she can continue on the same dosage. Return to clinic in 1 month for follow-up.

## 2018-02-23 NOTE — Patient Instructions (Signed)
Denosumab injection What is this medicine? DENOSUMAB (den oh sue mab) slows bone breakdown. Prolia is used to treat osteoporosis in women after menopause and in men. Delton See is used to treat a high calcium level due to cancer and to prevent bone fractures and other bone problems caused by multiple myeloma or cancer bone metastases. Delton See is also used to treat giant cell tumor of the bone. This medicine may be used for other purposes; ask your health care provider or pharmacist if you have questions. COMMON BRAND NAME(S): Prolia, XGEVA What should I tell my health care provider before I take this medicine? They need to know if you have any of these conditions: -dental disease -having surgery or tooth extraction -infection -kidney disease -low levels of calcium or Vitamin D in the blood -malnutrition -on hemodialysis -skin conditions or sensitivity -thyroid or parathyroid disease -an unusual reaction to denosumab, other medicines, foods, dyes, or preservatives -pregnant or trying to get pregnant -breast-feeding How should I use this medicine? This medicine is for injection under the skin. It is given by a health care professional in a hospital or clinic setting. If you are getting Prolia, a special MedGuide will be given to you by the pharmacist with each prescription and refill. Be sure to read this information carefully each time. For Prolia, talk to your pediatrician regarding the use of this medicine in children. Special care may be needed. For Delton See, talk to your pediatrician regarding the use of this medicine in children. While this drug may be prescribed for children as young as 13 years for selected conditions, precautions do apply. Overdosage: If you think you have taken too much of this medicine contact a poison control center or emergency room at once. NOTE: This medicine is only for you. Do not share this medicine with others. What if I miss a dose? It is important not to miss your  dose. Call your doctor or health care professional if you are unable to keep an appointment. What may interact with this medicine? Do not take this medicine with any of the following medications: -other medicines containing denosumab This medicine may also interact with the following medications: -medicines that lower your chance of fighting infection -steroid medicines like prednisone or cortisone This list may not describe all possible interactions. Give your health care provider a list of all the medicines, herbs, non-prescription drugs, or dietary supplements you use. Also tell them if you smoke, drink alcohol, or use illegal drugs. Some items may interact with your medicine. What should I watch for while using this medicine? Visit your doctor or health care professional for regular checks on your progress. Your doctor or health care professional may order blood tests and other tests to see how you are doing. Call your doctor or health care professional for advice if you get a fever, chills or sore throat, or other symptoms of a cold or flu. Do not treat yourself. This drug may decrease your body's ability to fight infection. Try to avoid being around people who are sick. You should make sure you get enough calcium and vitamin D while you are taking this medicine, unless your doctor tells you not to. Discuss the foods you eat and the vitamins you take with your health care professional. See your dentist regularly. Brush and floss your teeth as directed. Before you have any dental work done, tell your dentist you are receiving this medicine. Do not become pregnant while taking this medicine or for 5 months after stopping  it. Talk with your doctor or health care professional about your birth control options while taking this medicine. Women should inform their doctor if they wish to become pregnant or think they might be pregnant. There is a potential for serious side effects to an unborn child. Talk  to your health care professional or pharmacist for more information. What side effects may I notice from receiving this medicine? Side effects that you should report to your doctor or health care professional as soon as possible: -allergic reactions like skin rash, itching or hives, swelling of the face, lips, or tongue -bone pain -breathing problems -dizziness -jaw pain, especially after dental work -redness, blistering, peeling of the skin -signs and symptoms of infection like fever or chills; cough; sore throat; pain or trouble passing urine -signs of low calcium like fast heartbeat, muscle cramps or muscle pain; pain, tingling, numbness in the hands or feet; seizures -unusual bleeding or bruising -unusually weak or tired Side effects that usually do not require medical attention (report to your doctor or health care professional if they continue or are bothersome): -constipation -diarrhea -headache -joint pain -loss of appetite -muscle pain -runny nose -tiredness -upset stomach This list may not describe all possible side effects. Call your doctor for medical advice about side effects. You may report side effects to FDA at 1-800-FDA-1088. Where should I keep my medicine? This medicine is only given in a clinic, doctor's office, or other health care setting and will not be stored at home. NOTE: This sheet is a summary. It may not cover all possible information. If you have questions about this medicine, talk to your doctor, pharmacist, or health care provider.  2018 Elsevier/Gold Standard (2016-12-03 19:17:21)   Fulvestrant injection What is this medicine? FULVESTRANT (ful VES trant) blocks the effects of estrogen. It is used to treat breast cancer. This medicine may be used for other purposes; ask your health care provider or pharmacist if you have questions. COMMON BRAND NAME(S): FASLODEX What should I tell my health care provider before I take this medicine? They need to  know if you have any of these conditions: -bleeding problems -liver disease -low levels of platelets in the blood -an unusual or allergic reaction to fulvestrant, other medicines, foods, dyes, or preservatives -pregnant or trying to get pregnant -breast-feeding How should I use this medicine? This medicine is for injection into a muscle. It is usually given by a health care professional in a hospital or clinic setting. Talk to your pediatrician regarding the use of this medicine in children. Special care may be needed. Overdosage: If you think you have taken too much of this medicine contact a poison control center or emergency room at once. NOTE: This medicine is only for you. Do not share this medicine with others. What if I miss a dose? It is important not to miss your dose. Call your doctor or health care professional if you are unable to keep an appointment. What may interact with this medicine? -medicines that treat or prevent blood clots like warfarin, enoxaparin, and dalteparin This list may not describe all possible interactions. Give your health care provider a list of all the medicines, herbs, non-prescription drugs, or dietary supplements you use. Also tell them if you smoke, drink alcohol, or use illegal drugs. Some items may interact with your medicine. What should I watch for while using this medicine? Your condition will be monitored carefully while you are receiving this medicine. You will need important blood work done while you   this medicine. Do not become pregnant while taking this medicine or for at least 1 year after stopping it. Women of child-bearing potential will need to have a negative pregnancy test before starting this medicine. Women should inform their doctor if they wish to become pregnant or think they might be pregnant. There is a potential for serious side effects to an unborn child. Men should inform their doctors if they wish to father a child. This  medicine may lower sperm counts. Talk to your health care professional or pharmacist for more information. Do not breast-feed an infant while taking this medicine or for 1 year after the last dose. What side effects may I notice from receiving this medicine? Side effects that you should report to your doctor or health care professional as soon as possible: -allergic reactions like skin rash, itching or hives, swelling of the face, lips, or tongue -feeling faint or lightheaded, falls -pain, tingling, numbness, or weakness in the legs -signs and symptoms of infection like fever or chills; cough; flu-like symptoms; sore throat -vaginal bleeding Side effects that usually do not require medical attention (report to your doctor or health care professional if they continue or are bothersome): -aches, pains -constipation -diarrhea -headache -hot flashes -nausea, vomiting -pain at site where injected -stomach pain This list may not describe all possible side effects. Call your doctor for medical advice about side effects. You may report side effects to FDA at 1-800-FDA-1088. Where should I keep my medicine? This drug is given in a hospital or clinic and will not be stored at home. NOTE: This sheet is a summary. It may not cover all possible information. If you have questions about this medicine, talk to your doctor, pharmacist, or health care provider.  2018 Elsevier/Gold Standard (2015-06-09 11:03:55)

## 2018-03-11 NOTE — Progress Notes (Signed)
Patient Care Team: [name], [place] At as PCP - General (Family Medicine)  DIAGNOSIS:  Encounter Diagnosis  Name Primary?  . Malignant neoplasm of upper-outer quadrant of left breast in female, estrogen receptor positive ([place])     SUMMARY OF ONCOLOGIC HISTORY:   Malignant neoplasm of upper-outer quadrant of left breast in female, estrogen receptor positive ([place])   07/28/2013 Mammogram    Spiculated mass at 6:00 position 3 cm, ultrasound revealed 2.6 x 2.1 x 1.5 cm second mass at 12:00 2.1 x 1.8 x 2.1 cm: 2 abnormal lymph nodes left axilla      10/29/2013 Initial Diagnosis    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 [name], T3N2 (5/10 LN positive) stage IIIa IDC grade 1, ER/PR 100%, TSH 14% HER-2 negative PET/CT 12/20/2013 negative for metastases      12/17/2013 - 04/02/2014 Chemotherapy    Taxotere Cytoxan every 3 weeks for 6 cycles      05/11/2014 - 06/27/2014 Radiation Therapy    Adjuvant radiation therapy to chest wall and lymph nodes      07/06/2014 -  Anti-estrogen oral therapy    Arimidex 1 mg daily      12/21/2017 Imaging    Metastatic lesions noted in the lungs bilaterally, bilateral hilar and mediastinal lymph nodes, left pleural effusion, multiple thoracic vertebral metastases, heterogeneous lesions in the liver      12/22/2017 Procedure    Pleural Effusion Thoracentesis: Metastatic Breast cancer ER PR Positive Her 2 Neg       CHIEF COMPLIANT: Follow-up on Ibrance and Faslodex after scans  INTERVAL HISTORY: Wendy [name] is a 77 year old with above-mentioned history of metastatic breast cancer currently on Faslodex with Ibrance.  She continues to have nausea and vomiting issues because of which she is not able to eat or drink well.  She has lost more weight since the last visit.  She had recent scans  and is here to discuss the results.   REVIEW OF SYSTEMS:   Constitutional: Denies fevers, chills or abnormal weight loss Eyes: Denies blurriness of vision Ears, nose, mouth, throat, and face: Denies mucositis or sore throat Respiratory: Denies cough, dyspnea or wheezes Cardiovascular: Denies palpitation, chest discomfort Gastrointestinal:  Denies nausea, heartburn or change in bowel habits Skin: Denies abnormal skin rashes Lymphatics: Denies new lymphadenopathy or easy bruising Neurological:Denies numbness, tingling or new weaknesses Behavioral/Psych: Mood is stable, no new changes  Extremities: No lower extremity edema  All other systems were reviewed with the patient and are negative.  I have reviewed the past medical history, past surgical history, social history and family history with the patient and they are unchanged from previous note.  ALLERGIES:  has No Known Allergies.  MEDICATIONS:  Current Outpatient Medications  Medication Sig Dispense Refill  . aspirin EC 81 MG tablet Take 81 mg by mouth daily.    [name] atorvastatin (LIPITOR) 40 MG tablet Take 20 mg by mouth at bedtime.     [name] CALCIUM PO Take 1 tablet by mouth daily.    . Multiple Vitamin (MULTIVITAMIN WITH MINERALS) TABS tablet Take 1 tablet by mouth daily.    [name] NITROSTAT 0.4 MG SL tablet Place 0.4 mg under the tongue every 5 (five) minutes as needed for chest pain.     [name] omega-3 acid ethyl esters (LOVAZA) 1 G capsule Take 1  g by mouth daily.    . ondansetron (ZOFRAN ODT) 8 MG disintegrating tablet Take 1 tablet (8 mg total) by mouth every 8 (eight) hours as needed for nausea or vomiting. 30 tablet 6  . palbociclib (IBRANCE) 125 MG capsule Take 1 capsule (125 mg total) by mouth daily with breakfast. Take for 21 days on, 7 days off. Take whole with food. 21 capsule 3  . promethazine (PHENERGAN) 25 MG tablet Take 1 tablet (25 mg total) by mouth every 8 (eight) hours as needed for nausea. 30 tablet 3  .  valsartan-hydrochlorothiazide (DIOVAN HCT) 160-12.5 MG tablet Take 1 tablet by mouth daily. 1/2 tab twice daily (Patient taking differently: Take 0.5 tablets by mouth at bedtime. )     No current facility-administered medications for this visit.     PHYSICAL EXAMINATION: ECOG PERFORMANCE STATUS: 1 - Symptomatic but completely ambulatory  Vitals:   03/23/18 1013  BP: (!) 104/51  Pulse: 86  Resp: 16  Temp: (!) 97.5 F (36.4 C)  SpO2: 100%   Filed Weights   03/23/18 1013  Weight: 154 lb 1.6 oz (69.9 kg)    GENERAL:alert, no distress and comfortable SKIN: skin color, texture, turgor are normal, no rashes or significant lesions EYES: normal, Conjunctiva are pink and non-injected, sclera clear OROPHARYNX:no exudate, no erythema and lips, buccal mucosa, and tongue normal  NECK: supple, thyroid normal size, non-tender, without nodularity LYMPH:  no palpable lymphadenopathy in the cervical, axillary or inguinal LUNGS: clear to auscultation and percussion with normal breathing effort HEART: regular rate & rhythm and no murmurs and no lower extremity edema ABDOMEN:abdomen soft, non-tender and normal bowel sounds MUSCULOSKELETAL:no cyanosis of digits and no clubbing  NEURO: alert & oriented x 3 with fluent speech, no focal motor/sensory deficits EXTREMITIES: No lower extremity edema   LABORATORY DATA:  I have reviewed the data as listed CMP Latest Ref Rng & Units 02/23/2018 01/26/2018 01/12/2018  Glucose 70 - 140 mg/dL 99 130 92  BUN 7 - 26 mg/dL 22 31(H) 25  Creatinine 0.60 - 1.10 mg/dL 1.21(H) 1.61(H) 1.24(H)  Sodium 136 - 145 mmol/L 136 141 139  Potassium 3.5 - 5.1 mmol/L 4.1 3.4(L) 3.9  Chloride 98 - 109 mmol/L 101 104 102  CO2 22 - 29 mmol/L _0 Calcium 8.4 - 10.4 mg/dL 9.8 9.2 10.5(H)  Total Protein 6.4 - 8.3 g/dL 7.1 6.9 7.2  Total Bilirubin 0.2 - 1.2 mg/dL 0.4 0.5 0.6  Alkaline Phos 40 - 150 U/L 65 65 74  AST 5 - 34 U/L _1 ALT 0 - 55 U/L _2 Lab  Results  Component Value Date   WBC 2.7 (L) 03/23/2018   HGB 11.0 (L) 03/23/2018   HCT 32.6 (L) 03/23/2018   MCV 95.6 03/23/2018   PLT 247 03/23/2018   NEUTROABS 1.3 (L) 03/23/2018    ASSESSMENT & PLAN:  Malignant neoplasm of upper-outer quadrant of left breast in female, 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 She had radiation therapy and has been on antiestrogen therapy with Arimidex 06/28/2014-January 2019  CT chest 12/21/2017: Multiple bilateral lung nodules, bilateral mediastinal nodes, multiple thoracic vertebral metastases, left pleural effusion, liver lesions  Thoracentesis: Met Breast cancer ER PR Positive and Her 2 Neg PET/CT scan. 01/04/18: New malignant Pleural effusion  with small bil pulm nodules,new mild bilateral mediastinal hilar portocaval lymph node metastases, new liver metastases, new diffuse bone metastases.  Current treatment: Ibrance with Faslodex started 12/29/2017 Ibrance toxicities: Fatigue is a primary toxicity Nausea vomiting: She takes Zofran and Phenergan.  Weight loss: Patient is not able to eat or drink well because of the nausea and vomiting. Patient continues to lose weight because of decreased appetite.  She tried CBD oil but she threw up.  She is going to try to gain.  If not we may consider giving her Megace.  Faslodex toxicities: Joint and muscle aches. These seem to get better with activity.  Lab review: ANC 1.3. Hemoglobin 11  CT scans 03/16/2018: Interval decrease in size of bilateral pulmonary nodules and mediastinal adenopathy compared to previous CT, multiple liver lesions again demonstrated difficult to compare but overall similar in size and number, moderate left pleural effusion with pleural-based nodularity and extensive osseous metastatic  disease similar to previous exam. I discussed the patient with the results suggests response to treatment.  Return to clinic in 1 month for follow-up with labs.  If she continues to have the severe nausea and weight loss, we may have to reduce the dosage of Ibrance.  I instructed them not to fill of the next months prescription until she sees me back patient's daughter is trying to add more milkshakes to keep her weight stable. We could repeat another CT scan in 3 months.   Orders Placed This Encounter  Procedures  . CBC with Differential (Cancer Center Only)    Standing Status:   Future    Standing Expiration Date:   03/24/2019  . CMP ([facility] only)    Standing Status:   Future    Standing Expiration Date:   03/24/2019   The patient has a good understanding of the overall plan. she agrees with it. she will call with any problems that may develop before the next visit here.   Harriette Ohara, MD 03/23/18

## 2018-03-11 NOTE — Assessment & Plan Note (Signed)
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 She had radiation therapy and has been on antiestrogen therapy with Arimidex 06/28/2014-January 2019  CT chest 12/21/2017: Multiple bilateral lung nodules, bilateral mediastinal nodes, multiple thoracic vertebral metastases, left pleural effusion, liver lesions  Thoracentesis: Met Breast cancer ER PR Positive and Her 2 Neg PET/CT scan. 01/04/18: New malignant Pleural effusion with small bil pulm nodules,new mild bilateral mediastinal hilar portocaval lymph node metastases, new liver metastases, new diffuse bone metastases.  Current treatment: Ibrance with Faslodex started 12/29/2017 Ibrance toxicities: Fatigue is a primary toxicity Nausea vomiting: She takes Zofran and Phenergan.  Weight loss: Patient is not able to eat or drink well because of the nausea and vomiting. Patient continues to lose weight because of decreased appetite.  She tried CBD oil but she threw up.  She is going to try to gain.  If not we may consider giving her Megace.  Faslodex toxicities: Joint and muscle aches. These seem to get better with activity.  Lab review: ANC 1.1.   CT scans 03/16/2018:  Return to clinic every 3 months with labs and follow-up

## 2018-03-13 ENCOUNTER — Telehealth: Payer: Self-pay

## 2018-03-13 NOTE — Telephone Encounter (Signed)
Returned pt VM and went over contrast directions with patient for scan on Monday. No further questions at this time.  Cyndia Bent RN

## 2018-03-16 ENCOUNTER — Ambulatory Visit (HOSPITAL_COMMUNITY)
Admission: RE | Admit: 2018-03-16 | Discharge: 2018-03-16 | Disposition: A | Discharge status: Home / Self Care | Payer: Medicare Other | Source: Ambulatory Visit | Attending: Hematology and Oncology | Admitting: Hematology and Oncology

## 2018-03-16 DIAGNOSIS — Z5111 Encounter for antineoplastic chemotherapy: Secondary | ICD-10-CM | POA: Diagnosis not present

## 2018-03-16 DIAGNOSIS — K769 Liver disease, unspecified: Secondary | ICD-10-CM | POA: Insufficient documentation

## 2018-03-16 DIAGNOSIS — J90 Pleural effusion, not elsewhere classified: Secondary | ICD-10-CM | POA: Insufficient documentation

## 2018-03-16 DIAGNOSIS — Z170 Estrogen receptor positive status [ER+]: Secondary | ICD-10-CM | POA: Diagnosis not present

## 2018-03-16 DIAGNOSIS — C50412 Malignant neoplasm of upper-outer quadrant of left female breast: Secondary | ICD-10-CM

## 2018-03-16 DIAGNOSIS — C7951 Secondary malignant neoplasm of bone: Secondary | ICD-10-CM | POA: Insufficient documentation

## 2018-03-16 MED ORDER — IOHEXOL 300 MG/ML  SOLN
75.0000 mL | Freq: Once | INTRAMUSCULAR | Status: AC | PRN
  Administered 2018-03-16: 75 mL via INTRAVENOUS

## 2018-03-20 ENCOUNTER — Other Ambulatory Visit: Payer: Self-pay

## 2018-03-20 DIAGNOSIS — C7800 Secondary malignant neoplasm of unspecified lung: Secondary | ICD-10-CM

## 2018-03-23 ENCOUNTER — Ambulatory Visit: Payer: Medicare Other

## 2018-03-23 ENCOUNTER — Inpatient Hospital Stay: Payer: Medicare Other

## 2018-03-23 ENCOUNTER — Inpatient Hospital Stay (HOSPITAL_BASED_OUTPATIENT_CLINIC_OR_DEPARTMENT_OTHER): Payer: Medicare Other | Admitting: Hematology and Oncology

## 2018-03-23 DIAGNOSIS — C7800 Secondary malignant neoplasm of unspecified lung: Secondary | ICD-10-CM

## 2018-03-23 DIAGNOSIS — C7802 Secondary malignant neoplasm of left lung: Secondary | ICD-10-CM

## 2018-03-23 DIAGNOSIS — C7951 Secondary malignant neoplasm of bone: Secondary | ICD-10-CM

## 2018-03-23 DIAGNOSIS — Z923 Personal history of irradiation: Secondary | ICD-10-CM

## 2018-03-23 DIAGNOSIS — Z170 Estrogen receptor positive status [ER+]: Secondary | ICD-10-CM

## 2018-03-23 DIAGNOSIS — C787 Secondary malignant neoplasm of liver and intrahepatic bile duct: Secondary | ICD-10-CM | POA: Diagnosis not present

## 2018-03-23 DIAGNOSIS — R634 Abnormal weight loss: Secondary | ICD-10-CM

## 2018-03-23 DIAGNOSIS — Z5111 Encounter for antineoplastic chemotherapy: Secondary | ICD-10-CM | POA: Diagnosis not present

## 2018-03-23 DIAGNOSIS — C7801 Secondary malignant neoplasm of right lung: Secondary | ICD-10-CM | POA: Diagnosis not present

## 2018-03-23 DIAGNOSIS — C50412 Malignant neoplasm of upper-outer quadrant of left female breast: Secondary | ICD-10-CM

## 2018-03-23 DIAGNOSIS — R112 Nausea with vomiting, unspecified: Secondary | ICD-10-CM

## 2018-03-23 LAB — CBC WITH DIFFERENTIAL (CANCER CENTER ONLY)
Basophils Absolute: 0.1 10*3/uL (ref 0.0–0.1)
Basophils Relative: 3 %
Eosinophils Absolute: 0.1 10*3/uL (ref 0.0–0.5)
Eosinophils Relative: 2 %
HCT: 32.6 % — ABNORMAL LOW (ref 34.8–46.6)
Hemoglobin: 11 g/dL — ABNORMAL LOW (ref 11.6–15.9)
Lymphocytes Relative: 24 %
Lymphs Abs: 0.6 10*3/uL — ABNORMAL LOW (ref 0.9–3.3)
MCH: 32.2 pg (ref 25.1–34.0)
MCHC: 33.7 g/dL (ref 31.5–36.0)
MCV: 95.6 fL (ref 79.5–101.0)
Monocytes Absolute: 0.6 10*3/uL (ref 0.1–0.9)
Monocytes Relative: 22 %
Neutro Abs: 1.3 10*3/uL — ABNORMAL LOW (ref 1.5–6.5)
Neutrophils Relative %: 49 %
Platelet Count: 247 10*3/uL (ref 145–400)
RBC: 3.41 MIL/uL — ABNORMAL LOW (ref 3.70–5.45)
RDW: 21.5 % — ABNORMAL HIGH (ref 11.2–14.5)
WBC Count: 2.7 10*3/uL — ABNORMAL LOW (ref 3.9–10.3)

## 2018-03-23 LAB — CMP (CANCER CENTER ONLY)
ALT: 11 U/L (ref 0–55)
AST: 21 U/L (ref 5–34)
Albumin: 3.6 g/dL (ref 3.5–5.0)
Alkaline Phosphatase: 62 U/L (ref 40–150)
Anion gap: 10 (ref 3–11)
BUN: 29 mg/dL — ABNORMAL HIGH (ref 7–26)
CO2: 25 mmol/L (ref 22–29)
Calcium: 10.1 mg/dL (ref 8.4–10.4)
Chloride: 103 mmol/L (ref 98–109)
Creatinine: 1.23 mg/dL — ABNORMAL HIGH (ref 0.60–1.10)
GFR, Est AFR Am: 48 mL/min — ABNORMAL LOW (ref >60)
GFR, Estimated: 42 mL/min — ABNORMAL LOW (ref >60)
Glucose, Bld: 105 mg/dL (ref 70–140)
Potassium: 4.1 mmol/L (ref 3.5–5.1)
Sodium: 138 mmol/L (ref 136–145)
Total Bilirubin: 0.3 mg/dL (ref 0.2–1.2)
Total Protein: 7.4 g/dL (ref 6.4–8.3)

## 2018-03-23 MED ORDER — DENOSUMAB 120 MG/1.7ML ~~LOC~~ SOLN
120.0000 mg | Freq: Once | SUBCUTANEOUS | Status: AC
  Administered 2018-03-23: 120 mg via SUBCUTANEOUS

## 2018-03-23 MED ORDER — DENOSUMAB 120 MG/1.7ML ~~LOC~~ SOLN
SUBCUTANEOUS | Status: AC
  Filled 2018-03-23: qty 1.7

## 2018-03-23 MED ORDER — FULVESTRANT 250 MG/5ML IM SOLN
500.0000 mg | Freq: Once | INTRAMUSCULAR | Status: AC
  Administered 2018-03-23: 500 mg via INTRAMUSCULAR

## 2018-03-23 MED ORDER — PROMETHAZINE HCL 25 MG PO TABS: 25.0000 mg | ORAL_TABLET | Freq: Three times a day (TID) | ORAL | 3 refills | Status: DC | PRN

## 2018-03-23 MED ORDER — FULVESTRANT 250 MG/5ML IM SOLN
INTRAMUSCULAR | Status: AC
Start: 2018-03-23 — End: 2018-03-23
  Filled 2018-03-23: qty 10

## 2018-03-23 MED FILL — IBRANCE 125 MG CAPSULE: 125 | 28 days supply | Qty: 21 | Fill #3

## 2018-03-24 ENCOUNTER — Other Ambulatory Visit: Payer: Self-pay | Admitting: Pharmacist

## 2018-03-24 NOTE — Progress Notes (Signed)
Prior auth for Promethazine 25 mg tablets has been submitted. Status is pending.

## 2018-03-25 NOTE — Progress Notes (Signed)
Prior auth for Promethazine 25 mg tabs has been approved.

## 2018-03-30 ENCOUNTER — Ambulatory Visit: Payer: Medicare Other

## 2018-04-21 ENCOUNTER — Telehealth: Payer: Self-pay | Admitting: Hematology and Oncology

## 2018-04-21 ENCOUNTER — Inpatient Hospital Stay: Payer: Medicare Other | Attending: Hematology and Oncology | Admitting: Hematology and Oncology

## 2018-04-21 ENCOUNTER — Inpatient Hospital Stay: Payer: Medicare Other

## 2018-04-21 DIAGNOSIS — Z5111 Encounter for antineoplastic chemotherapy: Secondary | ICD-10-CM | POA: Insufficient documentation

## 2018-04-21 DIAGNOSIS — Z923 Personal history of irradiation: Secondary | ICD-10-CM | POA: Diagnosis not present

## 2018-04-21 DIAGNOSIS — Z9012 Acquired absence of left breast and nipple: Secondary | ICD-10-CM | POA: Diagnosis not present

## 2018-04-21 DIAGNOSIS — J90 Pleural effusion, not elsewhere classified: Secondary | ICD-10-CM | POA: Insufficient documentation

## 2018-04-21 DIAGNOSIS — Z7982 Long term (current) use of aspirin: Secondary | ICD-10-CM | POA: Diagnosis not present

## 2018-04-21 DIAGNOSIS — R110 Nausea: Secondary | ICD-10-CM | POA: Diagnosis not present

## 2018-04-21 DIAGNOSIS — C50412 Malignant neoplasm of upper-outer quadrant of left female breast: Secondary | ICD-10-CM

## 2018-04-21 DIAGNOSIS — C7951 Secondary malignant neoplasm of bone: Secondary | ICD-10-CM | POA: Diagnosis not present

## 2018-04-21 DIAGNOSIS — Z170 Estrogen receptor positive status [ER+]: Secondary | ICD-10-CM | POA: Insufficient documentation

## 2018-04-21 LAB — CMP (CANCER CENTER ONLY)
ALT: 12 U/L (ref 0–55)
AST: 20 U/L (ref 5–34)
Albumin: 3.4 g/dL — ABNORMAL LOW (ref 3.5–5.0)
Alkaline Phosphatase: 60 U/L (ref 40–150)
Anion gap: 10 (ref 3–11)
BUN: 29 mg/dL — ABNORMAL HIGH (ref 7–26)
CO2: 25 mmol/L (ref 22–29)
Calcium: 9.2 mg/dL (ref 8.4–10.4)
Chloride: 103 mmol/L (ref 98–109)
Creatinine: 1.21 mg/dL — ABNORMAL HIGH (ref 0.60–1.10)
GFR, Est AFR Am: 49 mL/min — ABNORMAL LOW (ref >60)
GFR, Estimated: 42 mL/min — ABNORMAL LOW (ref >60)
Glucose, Bld: 100 mg/dL (ref 70–140)
Potassium: 4.1 mmol/L (ref 3.5–5.1)
Sodium: 138 mmol/L (ref 136–145)
Total Bilirubin: 0.3 mg/dL (ref 0.2–1.2)
Total Protein: 6.7 g/dL (ref 6.4–8.3)

## 2018-04-21 LAB — CBC WITH DIFFERENTIAL (CANCER CENTER ONLY)
Basophils Absolute: 0.1 10*3/uL (ref 0.0–0.1)
Basophils Relative: 3 %
Eosinophils Absolute: 0 10*3/uL (ref 0.0–0.5)
Eosinophils Relative: 2 %
HCT: 29.2 % — ABNORMAL LOW (ref 34.8–46.6)
Hemoglobin: 9.7 g/dL — ABNORMAL LOW (ref 11.6–15.9)
Lymphocytes Relative: 24 %
Lymphs Abs: 0.6 10*3/uL — ABNORMAL LOW (ref 0.9–3.3)
MCH: 32.7 pg (ref 25.1–34.0)
MCHC: 33.1 g/dL (ref 31.5–36.0)
MCV: 99 fL (ref 79.5–101.0)
Monocytes Absolute: 0.5 10*3/uL (ref 0.1–0.9)
Monocytes Relative: 22 %
Neutro Abs: 1.1 10*3/uL — ABNORMAL LOW (ref 1.5–6.5)
Neutrophils Relative %: 49 %
Platelet Count: 189 10*3/uL (ref 145–400)
RBC: 2.95 MIL/uL — ABNORMAL LOW (ref 3.70–5.45)
RDW: 18.9 % — ABNORMAL HIGH (ref 11.2–14.5)
WBC Count: 2.3 10*3/uL — ABNORMAL LOW (ref 3.9–10.3)

## 2018-04-21 MED ORDER — PALBOCICLIB 100 MG PO CAPS: 100.0000 mg | ORAL_CAPSULE | Freq: Every day | ORAL | 3 refills | Status: DC

## 2018-04-21 MED ORDER — FULVESTRANT 250 MG/5ML IM SOLN
500.0000 mg | Freq: Once | INTRAMUSCULAR | Status: AC
Start: 2018-04-21 — End: 2018-04-21
  Administered 2018-04-21: 500 mg via INTRAMUSCULAR

## 2018-04-21 MED ORDER — DENOSUMAB 120 MG/1.7ML ~~LOC~~ SOLN
SUBCUTANEOUS | Status: AC
  Filled 2018-04-21: qty 1.7

## 2018-04-21 MED ORDER — FULVESTRANT 250 MG/5ML IM SOLN
INTRAMUSCULAR | Status: AC
Start: 2018-04-21 — End: 2018-04-21
  Filled 2018-04-21: qty 10

## 2018-04-21 MED FILL — IBRANCE 100 MG CAPSULE: 100 | 28 days supply | Qty: 21 | Fill #0

## 2018-04-21 NOTE — Telephone Encounter (Signed)
Per lab machine broke need to do injection tomorrow

## 2018-04-21 NOTE — Telephone Encounter (Signed)
Gave avs and calendar ° °

## 2018-04-21 NOTE — Assessment & Plan Note (Signed)
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 She had radiation therapy and has been on antiestrogen therapy with Arimidex 06/28/2014-January 2019  CT chest 12/21/2017: Multiple bilateral lung nodules, bilateral mediastinal nodes, multiple thoracic vertebral metastases, left pleural effusion, liver lesions  Thoracentesis: Met Breast cancer ER PR Positive and Her 2 Neg PET/CT scan. 01/04/18: New malignant Pleural effusion with small bil pulm nodules,new mild bilateral mediastinal hilar portocaval lymph node metastases, new liver metastases, new diffuse bone metastases.  Current treatment: Ibrance with Faslodexstarted 12/29/2017 Ibrance toxicities: Fatigue is a primary toxicity Nausea vomiting: She takes Zofran and Phenergan.  Weight loss: Patient is not able to eat or drink well because of the nausea and vomiting. Patient continues to lose weight because of decreased appetite. She tried CBD oil but she threw up. She is going to try to gain. If not we may consider giving her Megace.  Faslodex toxicities: Joint and muscle aches. These seem to get better with activity.  Lab review: ANC 1.3. Hemoglobin 11  CT scans 03/16/2018: Interval decrease in size of bilateral pulmonary nodules and mediastinal adenopathy compared to previous CT, multiple liver lesions again demonstrated difficult to compare but overall similar in size and number, moderate left pleural effusion with pleural-based nodularity and extensive osseous metastatic disease similar to previous exam.  Return to clinic in 1 month for follow-up with labs.  If she continues to have the severe nausea and weight loss, we may have to reduce the dosage of Ibrance

## 2018-04-21 NOTE — Progress Notes (Signed)
Patient Care Team: [name], [place] At as PCP - General (Family Medicine)  DIAGNOSIS:  Encounter Diagnosis  Name Primary?  . Malignant neoplasm of upper-outer quadrant of left breast in female, estrogen receptor positive ([place])     SUMMARY OF ONCOLOGIC HISTORY:   Malignant neoplasm of upper-outer quadrant of left breast in female, estrogen receptor positive ([place])   07/28/2013 Mammogram    Spiculated mass at 6:00 position 3 cm, ultrasound revealed 2.6 x 2.1 x 1.5 cm second mass at 12:00 2.1 x 1.8 x 2.1 cm: 2 abnormal lymph nodes left axilla      10/29/2013 Initial Diagnosis    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 [name], T3N2 (5/10 LN positive) stage IIIa IDC grade 1, ER/PR 100%, TSH 14% HER-2 negative PET/CT 12/20/2013 negative for metastases      12/17/2013 - 04/02/2014 Chemotherapy    Taxotere Cytoxan every 3 weeks for 6 cycles      05/11/2014 - 06/27/2014 Radiation Therapy    Adjuvant radiation therapy to chest wall and lymph nodes      07/06/2014 -  Anti-estrogen oral therapy    Arimidex 1 mg daily      12/21/2017 Imaging    Metastatic lesions noted in the lungs bilaterally, bilateral hilar and mediastinal lymph nodes, left pleural effusion, multiple thoracic vertebral metastases, heterogeneous lesions in the liver      12/22/2017 Procedure    Pleural Effusion Thoracentesis: Metastatic Breast cancer ER PR Positive Her 2 Neg       CHIEF COMPLIANT: Follow-up on Faslodex with Ibrance  INTERVAL HISTORY: Wendy [name] is a 77 year old with above-mentioned metastatic breast cancer currently on Faslodex with Ibrance.  She responded to the treatment based on the follow-up CT scans done in April.  However she has had profound nausea and vomiting to [facility] and because of that she is not eating well and losing  weight.  She is here to evaluate the next steps in the dosing of Ibrance.  She is doing quite well from the pleural effusion standpoint. Her nausea issues have continued in spite of taking Zofran and Phenergan as prescribed.  She is also getting more fatigued.  REVIEW OF SYSTEMS:   Constitutional: Denies fevers, chills or abnormal weight loss Eyes: Denies blurriness of vision Ears, nose, mouth, throat, and face: Denies mucositis or sore throat Respiratory: Denies cough, dyspnea or wheezes Cardiovascular: Denies palpitation, chest discomfort Gastrointestinal: Nausea and vomiting and weight loss Skin: Denies abnormal skin rashes Lymphatics: Denies new lymphadenopathy or easy bruising Neurological:Denies numbness, tingling or new weaknesses Behavioral/Psych: Mood is stable, no new changes  Extremities: No lower extremity edema All other systems were reviewed with the patient and are negative.  I have reviewed the past medical history, past surgical history, social history and family history with the patient and they are unchanged from previous note.  ALLERGIES:  has No Known Allergies.  MEDICATIONS:  Current Outpatient Medications  Medication Sig Dispense Refill  . aspirin EC 81 MG tablet Take 81 mg by mouth daily.    [name] atorvastatin (LIPITOR) 40 MG tablet Take 20 mg by mouth at bedtime.     [name] CALCIUM PO Take 1 tablet by mouth daily.    . Multiple Vitamin (MULTIVITAMIN WITH MINERALS) TABS tablet Take 1 tablet by mouth daily.    [name] NITROSTAT 0.4 MG  SL tablet Place 0.4 mg under the tongue every 5 (five) minutes as needed for chest pain.     [name] omega-3 acid ethyl esters (LOVAZA) 1 G capsule Take 1 g by mouth daily.    . ondansetron (ZOFRAN ODT) 8 MG disintegrating tablet Take 1 tablet (8 mg total) by mouth every 8 (eight) hours as needed for nausea or vomiting. 30 tablet 6  . palbociclib (IBRANCE) 100 MG capsule Take 1 capsule (100 mg total) by mouth daily with breakfast. Take for 21 days on, 7  days off. Take whole with food. 21 capsule 3  . promethazine (PHENERGAN) 25 MG tablet Take 1 tablet (25 mg total) by mouth every 8 (eight) hours as needed for nausea. 30 tablet 3  . valsartan-hydrochlorothiazide (DIOVAN HCT) 160-12.5 MG tablet Take 1 tablet by mouth daily. 1/2 tab twice daily (Patient taking differently: Take 0.5 tablets by mouth at bedtime. )     No current facility-administered medications for this visit.     PHYSICAL EXAMINATION: ECOG PERFORMANCE STATUS: 1 - Symptomatic but completely ambulatory  Vitals:   04/21/18 0948  BP: (!) 121/50  Pulse: 85  Resp: 17  Temp: 98.5 F (36.9 C)  SpO2: 99%   Filed Weights   04/21/18 0948  Weight: 152 lb 1.6 oz (69 kg)    GENERAL:alert, no distress and comfortable SKIN: skin color, texture, turgor are normal, no rashes or significant lesions EYES: normal, Conjunctiva are pink and non-injected, sclera clear OROPHARYNX:no exudate, no erythema and lips, buccal mucosa, and tongue normal  NECK: supple, thyroid normal size, non-tender, without nodularity LYMPH:  no palpable lymphadenopathy in the cervical, axillary or inguinal LUNGS: clear to auscultation and percussion with normal breathing effort HEART: regular rate & rhythm and no murmurs and no lower extremity edema ABDOMEN:abdomen soft, non-tender and normal bowel sounds MUSCULOSKELETAL:no cyanosis of digits and no clubbing  NEURO: alert & oriented x 3 with fluent speech, no focal motor/sensory deficits EXTREMITIES: No lower extremity edema  LABORATORY DATA:  I have reviewed the data as listed CMP Latest Ref Rng & Units 03/23/2018 02/23/2018 01/26/2018  Glucose 70 - 140 mg/dL 105 99 130  BUN 7 - 26 mg/dL 29(H) 22 31(H)  Creatinine 0.60 - 1.10 mg/dL 1.23(H) 1.21(H) 1.61(H)  Sodium 136 - 145 mmol/L 138 136 141  Potassium 3.5 - 5.1 mmol/L 4.1 4.1 3.4(L)  Chloride 98 - 109 mmol/L 103 101 104  CO2 22 - 29 mmol/L 25 26 23   Calcium 8.4 - 10.4 mg/dL 10.1 9.8 9.2  Total Protein  6.4 - 8.3 g/dL 7.4 7.1 6.9  Total Bilirubin 0.2 - 1.2 mg/dL 0.3 0.4 0.5  Alkaline Phos 40 - 150 U/L 62 65 65  AST 5 - 34 U/L 21 18 16   ALT 0 - 55 U/L 11 12 11     Lab Results  Component Value Date   WBC 2.3 (L) 04/21/2018   HGB 9.7 (L) 04/21/2018   HCT 29.2 (L) 04/21/2018   MCV 99.0 04/21/2018   PLT 189 04/21/2018   NEUTROABS 1.1 (L) 04/21/2018    ASSESSMENT & PLAN:  Malignant neoplasm of upper-outer quadrant of left breast in female, 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 She had radiation therapy and has been on antiestrogen therapy with Arimidex 06/28/2014-January 2019  CT chest 12/21/2017: Multiple bilateral lung nodules, bilateral  mediastinal nodes, multiple thoracic vertebral metastases, left pleural effusion, liver lesions  Thoracentesis: Met Breast cancer ER PR Positive and Her 2 Neg PET/CT scan. 01/04/18: New malignant Pleural effusion with small bil pulm nodules,new mild bilateral mediastinal hilar portocaval lymph node metastases, new liver metastases, new diffuse bone metastases.  Current treatment: Ibrance with Faslodexstarted 12/29/2017 Ibrance toxicities: Fatigue is a primary toxicity Nausea vomiting: She takes Zofran and Phenergan. I recommended decreasing the dosage of Ibrance to 100 mg daily starting 04/21/2018.   Weight loss: Patient is not able to eat or drink well because of the nausea and vomiting. Patient continues to lose weight because of decreased appetite.   Faslodex toxicities: Joint and muscle aches. These seem to get better with activity.  Lab review: ANC 1.3. Hemoglobin 9.7  CT scans 03/16/2018: Interval decrease in size of bilateral pulmonary nodules and mediastinal adenopathy compared to previous CT, multiple liver lesions again demonstrated difficult  to compare but overall similar in size and number, moderate left pleural effusion with pleural-based nodularity and extensive osseous metastatic disease similar to previous exam.  Return to clinic in 1 month for follow-up with labs.  If she continues to have the severe nausea and weight loss, we may have to reduce the dosage of Ibrance      No orders of the defined types were placed in this encounter.  The patient has a good understanding of the overall plan. she agrees with it. she will call with any problems that may develop before the next visit here.   Harriette Ohara, MD 04/21/18

## 2018-04-22 ENCOUNTER — Inpatient Hospital Stay: Payer: Medicare Other

## 2018-04-22 DIAGNOSIS — Z170 Estrogen receptor positive status [ER+]: Secondary | ICD-10-CM | POA: Diagnosis not present

## 2018-04-22 DIAGNOSIS — Z923 Personal history of irradiation: Secondary | ICD-10-CM | POA: Diagnosis not present

## 2018-04-22 DIAGNOSIS — Z5111 Encounter for antineoplastic chemotherapy: Secondary | ICD-10-CM | POA: Diagnosis not present

## 2018-04-22 DIAGNOSIS — C50412 Malignant neoplasm of upper-outer quadrant of left female breast: Secondary | ICD-10-CM | POA: Diagnosis not present

## 2018-04-22 DIAGNOSIS — C7800 Secondary malignant neoplasm of unspecified lung: Secondary | ICD-10-CM

## 2018-04-22 DIAGNOSIS — C7951 Secondary malignant neoplasm of bone: Secondary | ICD-10-CM | POA: Diagnosis not present

## 2018-04-22 DIAGNOSIS — Z9012 Acquired absence of left breast and nipple: Secondary | ICD-10-CM | POA: Diagnosis not present

## 2018-04-22 MED ORDER — DENOSUMAB 120 MG/1.7ML ~~LOC~~ SOLN
SUBCUTANEOUS | Status: AC
  Filled 2018-04-22: qty 1.7

## 2018-04-22 MED ORDER — DENOSUMAB 120 MG/1.7ML ~~LOC~~ SOLN
120.0000 mg | Freq: Once | SUBCUTANEOUS | Status: AC
  Administered 2018-04-22: 120 mg via SUBCUTANEOUS

## 2018-04-22 NOTE — Patient Instructions (Signed)

## 2018-04-27 ENCOUNTER — Ambulatory Visit: Payer: Medicare Other

## 2018-05-15 ENCOUNTER — Other Ambulatory Visit: Payer: Self-pay | Admitting: *Deleted

## 2018-05-15 DIAGNOSIS — C7800 Secondary malignant neoplasm of unspecified lung: Secondary | ICD-10-CM

## 2018-05-18 ENCOUNTER — Inpatient Hospital Stay: Payer: Medicare Other

## 2018-05-18 ENCOUNTER — Telehealth: Payer: Self-pay | Admitting: Hematology and Oncology

## 2018-05-18 ENCOUNTER — Inpatient Hospital Stay: Payer: Medicare Other | Attending: Hematology and Oncology | Admitting: Hematology and Oncology

## 2018-05-18 VITALS — BP 97/39 | HR 82 | Temp 97.8°F | Resp 16 | Ht 68.0 in | Wt 148.9 lb

## 2018-05-18 DIAGNOSIS — C7951 Secondary malignant neoplasm of bone: Secondary | ICD-10-CM | POA: Insufficient documentation

## 2018-05-18 DIAGNOSIS — C787 Secondary malignant neoplasm of liver and intrahepatic bile duct: Secondary | ICD-10-CM | POA: Insufficient documentation

## 2018-05-18 DIAGNOSIS — Z170 Estrogen receptor positive status [ER+]: Secondary | ICD-10-CM | POA: Insufficient documentation

## 2018-05-18 DIAGNOSIS — Z5111 Encounter for antineoplastic chemotherapy: Secondary | ICD-10-CM | POA: Diagnosis not present

## 2018-05-18 DIAGNOSIS — C7800 Secondary malignant neoplasm of unspecified lung: Secondary | ICD-10-CM

## 2018-05-18 DIAGNOSIS — Z79899 Other long term (current) drug therapy: Secondary | ICD-10-CM | POA: Insufficient documentation

## 2018-05-18 DIAGNOSIS — C50412 Malignant neoplasm of upper-outer quadrant of left female breast: Secondary | ICD-10-CM | POA: Diagnosis not present

## 2018-05-18 LAB — CBC WITH DIFFERENTIAL (CANCER CENTER ONLY)
Basophils Absolute: 0.1 10*3/uL (ref 0.0–0.1)
Basophils Relative: 3 %
Eosinophils Absolute: 0.1 10*3/uL (ref 0.0–0.5)
Eosinophils Relative: 3 %
HCT: 31.1 % — ABNORMAL LOW (ref 34.8–46.6)
Hemoglobin: 10.4 g/dL — ABNORMAL LOW (ref 11.6–15.9)
Lymphocytes Relative: 23 %
Lymphs Abs: 0.6 10*3/uL — ABNORMAL LOW (ref 0.9–3.3)
MCH: 33.3 pg (ref 25.1–34.0)
MCHC: 33.4 g/dL (ref 31.5–36.0)
MCV: 99.4 fL (ref 79.5–101.0)
Monocytes Absolute: 0.5 10*3/uL (ref 0.1–0.9)
Monocytes Relative: 19 %
Neutro Abs: 1.4 10*3/uL — ABNORMAL LOW (ref 1.5–6.5)
Neutrophils Relative %: 52 %
Platelet Count: 207 10*3/uL (ref 145–400)
RBC: 3.12 MIL/uL — ABNORMAL LOW (ref 3.70–5.45)
RDW: 17.1 % — ABNORMAL HIGH (ref 11.2–14.5)
WBC Count: 2.6 10*3/uL — ABNORMAL LOW (ref 3.9–10.3)

## 2018-05-18 LAB — CMP (CANCER CENTER ONLY)
ALT: 10 U/L (ref 0–55)
AST: 20 U/L (ref 5–34)
Albumin: 3.6 g/dL (ref 3.5–5.0)
Alkaline Phosphatase: 59 U/L (ref 40–150)
Anion gap: 11 (ref 3–11)
BUN: 28 mg/dL — ABNORMAL HIGH (ref 7–26)
CO2: 26 mmol/L (ref 22–29)
Calcium: 9.4 mg/dL (ref 8.4–10.4)
Chloride: 102 mmol/L (ref 98–109)
Creatinine: 1.35 mg/dL — ABNORMAL HIGH (ref 0.60–1.10)
GFR, Est AFR Am: 43 mL/min — ABNORMAL LOW (ref >60)
GFR, Estimated: 37 mL/min — ABNORMAL LOW (ref >60)
Glucose, Bld: 130 mg/dL (ref 70–140)
Potassium: 3.8 mmol/L (ref 3.5–5.1)
Sodium: 139 mmol/L (ref 136–145)
Total Bilirubin: 0.4 mg/dL (ref 0.2–1.2)
Total Protein: 6.9 g/dL (ref 6.4–8.3)

## 2018-05-18 MED ORDER — FULVESTRANT 250 MG/5ML IM SOLN
500.0000 mg | Freq: Once | INTRAMUSCULAR | Status: AC
  Administered 2018-05-18: 500 mg via INTRAMUSCULAR

## 2018-05-18 MED ORDER — DENOSUMAB 120 MG/1.7ML ~~LOC~~ SOLN
SUBCUTANEOUS | Status: AC
  Filled 2018-05-18: qty 1.7

## 2018-05-18 MED ORDER — FULVESTRANT 250 MG/5ML IM SOLN
INTRAMUSCULAR | Status: AC
  Filled 2018-05-18: qty 5

## 2018-05-18 MED ORDER — DENOSUMAB 120 MG/1.7ML ~~LOC~~ SOLN
120.0000 mg | Freq: Once | SUBCUTANEOUS | Status: AC
  Administered 2018-05-18: 120 mg via SUBCUTANEOUS

## 2018-05-18 MED FILL — IBRANCE 100 MG CAPSULE: 100 | 28 days supply | Qty: 21 | Fill #1

## 2018-05-18 NOTE — Progress Notes (Signed)
Patient Care Team: [name], [place] At as PCP - General (Family Medicine)  DIAGNOSIS:  Encounter Diagnoses  Name Primary?  . Malignant neoplasm metastatic to lung, unspecified laterality ([place]) Yes  . Malignant neoplasm of upper-outer quadrant of left breast in female, estrogen receptor positive ([place])     SUMMARY OF ONCOLOGIC HISTORY:   Malignant neoplasm of upper-outer quadrant of left breast in female, estrogen receptor positive ([place])   07/28/2013 Mammogram    Spiculated mass at 6:00 position 3 cm, ultrasound revealed 2.6 x 2.1 x 1.5 cm second mass at 12:00 2.1 x 1.8 x 2.1 cm: 2 abnormal lymph nodes left axilla      10/29/2013 Initial Diagnosis    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 [name], T3N2 (5/10 LN positive) stage IIIa IDC grade 1, ER/PR 100%, TSH 14% HER-2 negative PET/CT 12/20/2013 negative for metastases      12/17/2013 - 04/02/2014 Chemotherapy    Taxotere Cytoxan every 3 weeks for 6 cycles      05/11/2014 - 06/27/2014 Radiation Therapy    Adjuvant radiation therapy to chest wall and lymph nodes      07/06/2014 -  Anti-estrogen oral therapy    Arimidex 1 mg daily      12/21/2017 Imaging    Metastatic lesions noted in the lungs bilaterally, bilateral hilar and mediastinal lymph nodes, left pleural effusion, multiple thoracic vertebral metastases, heterogeneous lesions in the liver      12/22/2017 Procedure    Pleural Effusion Thoracentesis: Metastatic Breast cancer ER PR Positive Her 2 Neg       CHIEF COMPLIANT: Follow-up of metastatic breast cancer on Ibrance with Faslodex, complaining of nausea issues  INTERVAL HISTORY: Wendy [name] is a 77 year old with above-mentioned history metastatic breast cancer who is here for one-month follow-up.  She has had continued nausea issues and vomiting as  well.  When she takes Zofran and Phenergan her symptoms appear to be better.  She is here today accompanied by her daughter.  She has continue to lose weight.  Her blood pressure today is also fairly low.  She does feel markedly fatigued.d   REVIEW OF SYSTEMS:   Constitutional: Denies fevers, chills or abnormal weight loss Eyes: Denies blurriness of vision Ears, nose, mouth, throat, and face: Denies mucositis or sore throat Respiratory: Denies cough, dyspnea or wheezes Cardiovascular: Denies palpitation, chest discomfort Gastrointestinal: nausea and vomiting Skin: Denies abnormal skin rashes Lymphatics: Denies new lymphadenopathy or easy bruising Neurological:Denies numbness, tingling or new weaknesses Behavioral/Psych: Mood is stable, no new changes  Extremities: No lower extremity edema All other systems were reviewed with the patient and are negative.  I have reviewed the past medical history, past surgical history, social history and family history with the patient and they are unchanged from previous note.  ALLERGIES:  has No Known Allergies.  MEDICATIONS:  Current Outpatient Medications  Medication Sig Dispense Refill  . aspirin EC 81 MG tablet Take 81 mg by mouth daily.    [name] atorvastatin (LIPITOR) 40 MG tablet Take 20 mg by mouth at bedtime.     [name] CALCIUM PO Take 1 tablet by mouth daily.    . Multiple Vitamin (MULTIVITAMIN WITH MINERALS) TABS tablet Take 1 tablet by mouth daily.    [name] NITROSTAT 0.4 MG SL tablet Place 0.4 mg under the tongue every 5 (  five) minutes as needed for chest pain.     [name] omega-3 acid ethyl esters (LOVAZA) 1 G capsule Take 1 g by mouth daily.    . ondansetron (ZOFRAN ODT) 8 MG disintegrating tablet Take 1 tablet (8 mg total) by mouth every 8 (eight) hours as needed for nausea or vomiting. 30 tablet 6  . palbociclib (IBRANCE) 100 MG capsule Take 1 capsule (100 mg total) by mouth daily with breakfast. Take for 21 days on, 7 days off. Take whole with food. 21  capsule 3  . promethazine (PHENERGAN) 25 MG tablet Take 1 tablet (25 mg total) by mouth every 8 (eight) hours as needed for nausea. 30 tablet 3  . valsartan-hydrochlorothiazide (DIOVAN HCT) 160-12.5 MG tablet Take 1 tablet by mouth daily. 1/2 tab twice daily (Patient taking differently: Take 0.5 tablets by mouth at bedtime. )     No current facility-administered medications for this visit.     PHYSICAL EXAMINATION: ECOG PERFORMANCE STATUS: 1 - Symptomatic but completely ambulatory  Vitals:   05/18/18 0855  BP: (!) 97/39  Pulse: 82  Resp: 16  Temp: 97.8 F (36.6 C)  SpO2: 100%   Filed Weights   05/18/18 0855  Weight: 148 lb 14.4 oz (67.5 kg)    GENERAL:alert, no distress and comfortable SKIN: skin color, texture, turgor are normal, no rashes or significant lesions EYES: normal, Conjunctiva are pink and non-injected, sclera clear OROPHARYNX:no exudate, no erythema and lips, buccal mucosa, and tongue normal  NECK: supple, thyroid normal size, non-tender, without nodularity LYMPH:  no palpable lymphadenopathy in the cervical, axillary or inguinal LUNGS: clear to auscultation and percussion with normal breathing effort HEART: regular rate & rhythm and no murmurs and no lower extremity edema ABDOMEN:abdomen soft, non-tender and normal bowel sounds MUSCULOSKELETAL:no cyanosis of digits and no clubbing  NEURO: alert & oriented x 3 with fluent speech, no focal motor/sensory deficits EXTREMITIES: No lower extremity edema  LABORATORY DATA:  I have reviewed the data as listed CMP Latest Ref Rng & Units 04/21/2018 03/23/2018 02/23/2018  Glucose 70 - 140 mg/dL 100 105 99  BUN 7 - 26 mg/dL 29(H) 29(H) 22  Creatinine 0.60 - 1.10 mg/dL 1.21(H) 1.23(H) 1.21(H)  Sodium 136 - 145 mmol/L 138 138 136  Potassium 3.5 - 5.1 mmol/L 4.1 4.1 4.1  Chloride 98 - 109 mmol/L 103 103 101  CO2 22 - 29 mmol/L _0 Calcium 8.4 - 10.4 mg/dL 9.2 10.1 9.8  Total Protein 6.4 - 8.3 g/dL 6.7 7.4 7.1  Total  Bilirubin 0.2 - 1.2 mg/dL 0.3 0.3 0.4  Alkaline Phos 40 - 150 U/L 60 62 65  AST 5 - 34 U/L _1 ALT 0 - 55 U/L _2 Lab Results  Component Value Date   WBC 2.6 (L) 05/18/2018   HGB 10.4 (L) 05/18/2018   HCT 31.1 (L) 05/18/2018   MCV 99.4 05/18/2018   PLT 207 05/18/2018   NEUTROABS 1.4 (L) 05/18/2018    ASSESSMENT & PLAN:  Malignant neoplasm of upper-outer quadrant of left breast in female, 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 She had radiation therapy and has been on antiestrogen therapy with Arimidex 06/28/2014-January 2019  CT chest 12/21/2017: Multiple bilateral lung nodules, bilateral mediastinal nodes, multiple thoracic vertebral metastases, left pleural effusion, liver  lesions  Thoracentesis: Met Breast cancer ER PR Positive and Her 2 Neg PET/CT scan. 01/04/18: New malignant Pleural effusion with small bil pulm nodules,new mild bilateral mediastinal hilar portocaval lymph node metastases, new liver metastases, new diffuse bone metastases.  Current treatment: Ibrance with Faslodexstarted 12/29/2017 Ibrance toxicities: Fatigue  Weight loss: Zofran will be scheduled every 8 hours.  If this does not work, we may consider giving her Megace.  Faslodex toxicities: Joint and muscle aches. These seem to get better with activity.  Lab review: ANC 1.4. Hemoglobin 10.4  Intractable nausea: She will schedule Zofran every 8 hours and take Phenergan as needed.  Return to clinic in 1 month for follow-up with labs and scans.      Orders Placed This Encounter  Procedures  . CT Chest W Contrast    Standing Status:   Future    Standing Expiration Date:   05/18/2019    Order Specific Question:   If indicated for the ordered procedure, I authorize the administration of  contrast media per Radiology protocol    Answer:   Yes    Order Specific Question:   Preferred imaging location?    Answer:   [facility]    Order Specific Question:   Radiology Contrast Protocol - do NOT remove file path    Answer:   \\[email or web address]    Order Specific Question:   ** REASON FOR EXAM (FREE TEXT)    Answer:   Met Breast cancer restaging  . NM Bone Scan Whole Body    Standing Status:   Future    Standing Expiration Date:   05/18/2019    Order Specific Question:   If indicated for the ordered procedure, I authorize the administration of a radiopharmaceutical per Radiology protocol    Answer:   Yes    Order Specific Question:   Preferred imaging location?    Answer:   [facility]    Order Specific Question:   Radiology Contrast Protocol - do NOT remove file path    Answer:   [email or web address]    Order Specific Question:   ** REASON FOR EXAM (FREE TEXT)    Answer:   Met Breast cancer restaging  . CT Abdomen Pelvis W Contrast    Standing Status:   Future    Standing Expiration Date:   05/18/2019    Order Specific Question:   If indicated for the ordered procedure, I authorize the administration of contrast media per Radiology protocol    Answer:   Yes    Order Specific Question:   Preferred imaging location?    Answer:   [facility]    Order Specific Question:   Is Oral Contrast requested for this exam?    Answer:   Yes, Per Radiology protocol    Order Specific Question:   Call Results- Best Contact Number?    Answer:   Met Breast cancer restaging    Order Specific Question:   Radiology Contrast Protocol - do NOT remove file path    Answer:   [email or web address]  . Ambulatory referral to Radiation Oncology    Referral Priority:   Routine    Referral Type:   Consultation    Referral Reason:   Specialty Services Required    Requested Specialty:   Radiation Oncology     Number of Visits Requested:   1   The patient has a good understanding of the overall plan. she agrees with it. she will call with any problems  that may develop before the next visit here.   Harriette Ohara, MD 05/18/18

## 2018-05-18 NOTE — Assessment & Plan Note (Signed)
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 She had radiation therapy and has been on antiestrogen therapy with Arimidex 06/28/2014-January 2019  CT chest 12/21/2017: Multiple bilateral lung nodules, bilateral mediastinal nodes, multiple thoracic vertebral metastases, left pleural effusion, liver lesions  Thoracentesis: Met Breast cancer ER PR Positive and Her 2 Neg PET/CT scan. 01/04/18: New malignant Pleural effusion with small bil pulm nodules,new mild bilateral mediastinal hilar portocaval lymph node metastases, new liver metastases, new diffuse bone metastases.  Current treatment: Ibrance with Faslodexstarted 12/29/2017 Ibrance toxicities: Fatigue  Weight loss: Zofran will be scheduled every 8 hours.  If this does not work, we may consider giving her Megace.  Faslodex toxicities: Joint and muscle aches. These seem to get better with activity.  Lab review: ANC 1.3. Hemoglobin 11  Intractable nausea: She will schedule Zofran every 8 hours and take Phenergan as needed.  Return to clinic in 1 month for follow-up with labs and scans.

## 2018-05-18 NOTE — Patient Instructions (Signed)
Denosumab injection What is this medicine? DENOSUMAB (den oh sue mab) slows bone breakdown. Prolia is used to treat osteoporosis in women after menopause and in men. Delton See is used to treat a high calcium level due to cancer and to prevent bone fractures and other bone problems caused by multiple myeloma or cancer bone metastases. Delton See is also used to treat giant cell tumor of the bone. This medicine may be used for other purposes; ask your health care provider or pharmacist if you have questions. COMMON BRAND NAME(S): Prolia, XGEVA What should I tell my health care provider before I take this medicine? They need to know if you have any of these conditions: -dental disease -having surgery or tooth extraction -infection -kidney disease -low levels of calcium or Vitamin D in the blood -malnutrition -on hemodialysis -skin conditions or sensitivity -thyroid or parathyroid disease -an unusual reaction to denosumab, other medicines, foods, dyes, or preservatives -pregnant or trying to get pregnant -breast-feeding How should I use this medicine? This medicine is for injection under the skin. It is given by a health care professional in a hospital or clinic setting. If you are getting Prolia, a special MedGuide will be given to you by the pharmacist with each prescription and refill. Be sure to read this information carefully each time. For Prolia, talk to your pediatrician regarding the use of this medicine in children. Special care may be needed. For Delton See, talk to your pediatrician regarding the use of this medicine in children. While this drug may be prescribed for children as young as 13 years for selected conditions, precautions do apply. Overdosage: If you think you have taken too much of this medicine contact a poison control center or emergency room at once. NOTE: This medicine is only for you. Do not share this medicine with others. What if I miss a dose? It is important not to miss your  dose. Call your doctor or health care professional if you are unable to keep an appointment. What may interact with this medicine? Do not take this medicine with any of the following medications: -other medicines containing denosumab This medicine may also interact with the following medications: -medicines that lower your chance of fighting infection -steroid medicines like prednisone or cortisone This list may not describe all possible interactions. Give your health care provider a list of all the medicines, herbs, non-prescription drugs, or dietary supplements you use. Also tell them if you smoke, drink alcohol, or use illegal drugs. Some items may interact with your medicine. What should I watch for while using this medicine? Visit your doctor or health care professional for regular checks on your progress. Your doctor or health care professional may order blood tests and other tests to see how you are doing. Call your doctor or health care professional for advice if you get a fever, chills or sore throat, or other symptoms of a cold or flu. Do not treat yourself. This drug may decrease your body's ability to fight infection. Try to avoid being around people who are sick. You should make sure you get enough calcium and vitamin D while you are taking this medicine, unless your doctor tells you not to. Discuss the foods you eat and the vitamins you take with your health care professional. See your dentist regularly. Brush and floss your teeth as directed. Before you have any dental work done, tell your dentist you are receiving this medicine. Do not become pregnant while taking this medicine or for 5 months after stopping  it. Talk with your doctor or health care professional about your birth control options while taking this medicine. Women should inform their doctor if they wish to become pregnant or think they might be pregnant. There is a potential for serious side effects to an unborn child. Talk  to your health care professional or pharmacist for more information. What side effects may I notice from receiving this medicine? Side effects that you should report to your doctor or health care professional as soon as possible: -allergic reactions like skin rash, itching or hives, swelling of the face, lips, or tongue -bone pain -breathing problems -dizziness -jaw pain, especially after dental work -redness, blistering, peeling of the skin -signs and symptoms of infection like fever or chills; cough; sore throat; pain or trouble passing urine -signs of low calcium like fast heartbeat, muscle cramps or muscle pain; pain, tingling, numbness in the hands or feet; seizures -unusual bleeding or bruising -unusually weak or tired Side effects that usually do not require medical attention (report to your doctor or health care professional if they continue or are bothersome): -constipation -diarrhea -headache -joint pain -loss of appetite -muscle pain -runny nose -tiredness -upset stomach This list may not describe all possible side effects. Call your doctor for medical advice about side effects. You may report side effects to FDA at 1-800-FDA-1088. Where should I keep my medicine? This medicine is only given in a clinic, doctor's office, or other health care setting and will not be stored at home. NOTE: This sheet is a summary. It may not cover all possible information. If you have questions about this medicine, talk to your doctor, pharmacist, or health care provider.  2018 Elsevier/Gold Standard (2016-12-03 19:17:21)  Fulvestrant injection What is this medicine? FULVESTRANT (ful VES trant) blocks the effects of estrogen. It is used to treat breast cancer. This medicine may be used for other purposes; ask your health care provider or pharmacist if you have questions. COMMON BRAND NAME(S): FASLODEX What should I tell my health care provider before I take this medicine? They need to know  if you have any of these conditions: -bleeding problems -liver disease -low levels of platelets in the blood -an unusual or allergic reaction to fulvestrant, other medicines, foods, dyes, or preservatives -pregnant or trying to get pregnant -breast-feeding How should I use this medicine? This medicine is for injection into a muscle. It is usually given by a health care professional in a hospital or clinic setting. Talk to your pediatrician regarding the use of this medicine in children. Special care may be needed. Overdosage: If you think you have taken too much of this medicine contact a poison control center or emergency room at once. NOTE: This medicine is only for you. Do not share this medicine with others. What if I miss a dose? It is important not to miss your dose. Call your doctor or health care professional if you are unable to keep an appointment. What may interact with this medicine? -medicines that treat or prevent blood clots like warfarin, enoxaparin, and dalteparin This list may not describe all possible interactions. Give your health care provider a list of all the medicines, herbs, non-prescription drugs, or dietary supplements you use. Also tell them if you smoke, drink alcohol, or use illegal drugs. Some items may interact with your medicine. What should I watch for while using this medicine? Your condition will be monitored carefully while you are receiving this medicine. You will need important blood work done while you are  taking this medicine. Do not become pregnant while taking this medicine or for at least 1 year after stopping it. Women of child-bearing potential will need to have a negative pregnancy test before starting this medicine. Women should inform their doctor if they wish to become pregnant or think they might be pregnant. There is a potential for serious side effects to an unborn child. Men should inform their doctors if they wish to father a child. This  medicine may lower sperm counts. Talk to your health care professional or pharmacist for more information. Do not breast-feed an infant while taking this medicine or for 1 year after the last dose. What side effects may I notice from receiving this medicine? Side effects that you should report to your doctor or health care professional as soon as possible: -allergic reactions like skin rash, itching or hives, swelling of the face, lips, or tongue -feeling faint or lightheaded, falls -pain, tingling, numbness, or weakness in the legs -signs and symptoms of infection like fever or chills; cough; flu-like symptoms; sore throat -vaginal bleeding Side effects that usually do not require medical attention (report to your doctor or health care professional if they continue or are bothersome): -aches, pains -constipation -diarrhea -headache -hot flashes -nausea, vomiting -pain at site where injected -stomach pain This list may not describe all possible side effects. Call your doctor for medical advice about side effects. You may report side effects to FDA at 1-800-FDA-1088. Where should I keep my medicine? This drug is given in a hospital or clinic and will not be stored at home. NOTE: This sheet is a summary. It may not cover all possible information. If you have questions about this medicine, talk to your doctor, pharmacist, or health care provider.  2018 Elsevier/Gold Standard (2015-06-09 11:03:55)

## 2018-05-18 NOTE — Telephone Encounter (Signed)
Gave avs and calendar per MD decouple

## 2018-05-20 ENCOUNTER — Encounter: Payer: Self-pay | Admitting: Radiation Oncology

## 2018-05-21 NOTE — Progress Notes (Signed)
Histology and Location of Primary Cancer:  Malignant neoplasm of upper-outer quadrant of left breast in female, estrogen receptor positive diagnosed 10/29/13.  Location of Symptomatic tumor: She reports pain to her lower back.   Past/Anticipated chemotherapy by medical oncology, if any:  05/18/18 Dr. [name] Past treatment:  12/17/2013- 04/02/2014 taxotere/ Cytoxan every 3 weeks for 6 cycles.  Current treatment: Ibrance with Faslodexstarted 12/29/2017 Ibrance toxicities: Fatigue   Patient's main complaints related to symptomatic tumor are: She reports pain to her lower back. The pain is a 8/10 today.   Pain on a scale of 0-10 is: 8/10. She does not take pain medicine. She reports if the pain gets really bad she will take tylenol or ibuprofen. The pain eases off when she sits down.    If Spine Met(s), symptoms, if any, include:  Bowel/Bladder retention or incontinence: She denies.   Numbness or weakness in extremities: She denies.   Current Decadron regimen, if applicable: N/A  Ambulatory status? Walker? Wheelchair?: She is ambulatory  SAFETY ISSUES: Prior radiation? Yes, Dr. Pablo Ledger Radiation treatment dates:  06/10/2014-06/27/2014 Site/dose:   Left chest wall / 50.4 Gray @ 1.8 [place] Cables per fraction x 28 fractions Left Supraclavicular fossa / 58 Gray _0 .8 Gray per fraction x 25 fractions Left PAB / 45 Gy at 1.8 Gray per fraction x 25 fractions Left scar / 10 Gray at [employer] per fraction x 5 fractions  Pacemaker/ICD? No  Possible current pregnancy? No  Is the patient on methotrexate? No  Additional Complaints / other details:  She reports decreased appetite.    03/16/18 CT Chest IMPRESSION: 1. Interval decrease in size of bilateral pulmonary nodules and mediastinal adenopathy when compared to prior CT. 2. Multiple liver lesions are demonstrated, difficult to compare given differences in imaging technique when compared to prior however grossly appear similar in size and  number. 3. Re-demonstrated moderate left pleural effusion with pleural based nodularity. 4. Extensive osseous metastatic disease which is predominantly similar when compared to prior exam. Slight interval decrease in soft tissue component involving the T2 vertebral body.  BP (!) 127/58   Pulse 78   Temp 97.7 F (36.5 C)   Resp 18   Ht 5' 8" (1.727 m)   Wt 153 lb (69.4 kg)   SpO2 96% Comment: room air  BMI 23.26 kg/m    Wt Readings from Last 3 Encounters:  05/27/18 153 lb (69.4 kg)  05/27/18 153 lb (69.4 kg)  05/18/18 148 lb 14.4 oz (67.5 kg)

## 2018-05-26 ENCOUNTER — Ambulatory Visit: Payer: Medicare Other | Admitting: Radiation Oncology

## 2018-05-26 ENCOUNTER — Ambulatory Visit: Payer: Medicare Other

## 2018-05-27 ENCOUNTER — Other Ambulatory Visit: Payer: Self-pay

## 2018-05-27 ENCOUNTER — Encounter: Payer: Self-pay | Admitting: Radiation Oncology

## 2018-05-27 ENCOUNTER — Ambulatory Visit
Admission: RE | Admit: 2018-05-27 | Discharge: 2018-05-27 | Disposition: A | Discharge status: Home / Self Care | Payer: Medicare Other | Source: Ambulatory Visit | Attending: Radiation Oncology | Admitting: Radiation Oncology

## 2018-05-27 VITALS — BP 127/58 | HR 78 | Temp 97.7°F | Resp 18 | Wt 153.0 lb

## 2018-05-27 DIAGNOSIS — Z87891 Personal history of nicotine dependence: Secondary | ICD-10-CM | POA: Diagnosis not present

## 2018-05-27 DIAGNOSIS — Z7982 Long term (current) use of aspirin: Secondary | ICD-10-CM | POA: Insufficient documentation

## 2018-05-27 DIAGNOSIS — Z79899 Other long term (current) drug therapy: Secondary | ICD-10-CM | POA: Diagnosis not present

## 2018-05-27 DIAGNOSIS — C7951 Secondary malignant neoplasm of bone: Secondary | ICD-10-CM

## 2018-05-27 DIAGNOSIS — E785 Hyperlipidemia, unspecified: Secondary | ICD-10-CM | POA: Diagnosis not present

## 2018-05-27 DIAGNOSIS — I252 Old myocardial infarction: Secondary | ICD-10-CM | POA: Insufficient documentation

## 2018-05-27 DIAGNOSIS — Z853 Personal history of malignant neoplasm of breast: Secondary | ICD-10-CM | POA: Diagnosis not present

## 2018-05-27 DIAGNOSIS — I10 Essential (primary) hypertension: Secondary | ICD-10-CM | POA: Insufficient documentation

## 2018-05-27 DIAGNOSIS — Z923 Personal history of irradiation: Secondary | ICD-10-CM | POA: Insufficient documentation

## 2018-05-27 DIAGNOSIS — Z9012 Acquired absence of left breast and nipple: Secondary | ICD-10-CM | POA: Insufficient documentation

## 2018-05-27 DIAGNOSIS — M542 Cervicalgia: Secondary | ICD-10-CM | POA: Insufficient documentation

## 2018-05-27 DIAGNOSIS — Z955 Presence of coronary angioplasty implant and graft: Secondary | ICD-10-CM | POA: Insufficient documentation

## 2018-05-27 DIAGNOSIS — M549 Dorsalgia, unspecified: Secondary | ICD-10-CM | POA: Diagnosis not present

## 2018-05-27 DIAGNOSIS — I739 Peripheral vascular disease, unspecified: Secondary | ICD-10-CM | POA: Diagnosis not present

## 2018-05-27 DIAGNOSIS — C50912 Malignant neoplasm of unspecified site of left female breast: Secondary | ICD-10-CM | POA: Diagnosis not present

## 2018-05-27 DIAGNOSIS — I2510 Atherosclerotic heart disease of native coronary artery without angina pectoris: Secondary | ICD-10-CM | POA: Insufficient documentation

## 2018-05-27 NOTE — Progress Notes (Signed)
Radiation Oncology         [phone] ________________________________  Initial outpatient Consultation  Name: Wendy [name] MRN: [id number]  Date: 05/27/2018  DOB: [birth year]  BT:[id number], [facility]  [name] Lose, MD   REFERRING PHYSICIAN: [name] Lose, MD  DIAGNOSIS:    ICD-10-CM   1. Bone metastases (HCC) C79.51    STAGE IV BREAST CANCER  CHIEF COMPLAINT: Here to discuss management of malignant left breast cancer  HISTORY OF PRESENT ILLNESS::Wendy [name] is a 77 y.o. female who presents with back pain for >2 months.  Known metastatic breast cancer, managed in med/onc by Dr [name].  She initially had a CXR on 12/21/2017 due to left CP x 1 day with results of: Left pleural effusion and basilar airspace disease could be due to pneumonia but the appearance is atypical.   CT Chest w contrast on 12/21/2017 showed: There is a new moderate left pleural effusion with enhancing nodularity within the pleural space, concerning for malignant effusion. Interval development of irregular nodules within the lungs bilaterally, multiple low-attenuation lesions within the liver, multiple lytic lesions within the thoracic spine and mediastinal/hilar adenopathy, most compatible with metastatic disease. Irregular subpleural opacity within the anterior left upper lobe may represent post radiation changes or metastatic disease. Aortic Atherosclerosis    PET restaging scan on 01/05/2018 showed: New malignant left pleural effusion, and small bilateral pulmonary metastases. New mild mediastinal, bilateral hilar, and portacaval lymph node metastases. New liver metastases. New diffuse bone metastases.  CT CAP w contrast on 03/16/2018 showed: Interval decrease in size of bilateral pulmonary nodules and mediastinal adenopathy when compared to prior CT. Multiple liver lesions are demonstrated, difficult to compare given differences in imaging technique when compared to prior however  grossly appear similar in size and number. Re-demonstrated moderate left pleural effusion with pleural based nodularity. Extensive osseous metastatic disease which is predominantly similar when compared to prior exam. Slight interval decrease in soft tissue component involving the T2 vertebral body.  Biopsy on 12/23/2017 showed: Pleural fluid, left (specimen 1 of 1 collected 12/23/2017) with malignant cells consistent with metastatic adenocarcinoma.   She has a CT CAP w contrast scheduled for 06/17/2018. She also has a bone scan scheduled for 06/17/2018.   Prognostic indicators significant for: ER, 95% positive with strong staining intensity and PR, 15% positive with weak staining intensity.  She is currently being treated with [place], and faslodex started on 12/29/2017 given by Medical Oncologist, Dr. [name].  On review of systems, she reports back pain that is worsened with palpation, prolonged standing, and ambulation, posterior neck pain, nausea due to chemotherapy treated with zofran BID and phenergan at night, lack of appetite, weight loss of 40 lbs since November 2018. She has had back pain for a couple of months now and it is a new different pain. She has a prior hx of compression fracture to the thoracic vertebrae that was previously treated "awhile" ago. She notes that her back pain is worsened with movement as well. she denies leg weakness/numbness, urinary/bowel issues, and any other symptoms. Pertinent positives are listed and detailed within the above HPI. 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   I personally reviewed her images prior to the encounter.    PREVIOUS RADIATION THERAPY: Yes, 05/11/2014-06/27/2014 with Adjuvant radiation therapy to chest wall and lymph nodes with Dr. Pablo Ledger.   PAST MEDICAL HISTORY:  has a past medical history of Breast cancer,  CAD (coronary artery disease), Hyperlipidemia, Hypertension, Myocardial infarction  ([place]) (11/22/04), and Peripheral vascular disease ([place]).    PAST SURGICAL HISTORY: Past Surgical History:  Procedure Laterality Date  . ABDOMINAL HYSTERECTOMY  1982  . KNEE ARTHROSCOPY     knee  . MASTECTOMY MODIFIED RADICAL Left 11/12/2013   Procedure: MASTECTOMY MODIFIED RADICAL;  Surgeon: Rolm Bookbinder, MD;  Location: [facility] ORS;  Service: General;  Laterality: Left;  . 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 PERCUTANEOUS CORONARY STENT INTERVENTION (PCI-S)  2005  . PORTACATH PLACEMENT Right 12/16/2013   Procedure: INSERTION PORT-A-CATH;  Surgeon: Rolm Bookbinder, MD;  Location: [facility];  Service: General;  Laterality: Right;  . TEE WITHOUT CARDIOVERSION N/A 09/26/2017   Procedure: TRANSESOPHAGEAL ECHOCARDIOGRAM (TEE);  Surgeon: Dorothy Spark, MD;  Location: [facility] ENDOSCOPY;  Service: Cardiovascular;  Laterality: N/A;    FAMILY HISTORY: family history includes Emphysema in her mother; Heart attack in her father.  SOCIAL HISTORY:  reports that she quit smoking about 34 years ago. Her smoking use included cigarettes. She smoked 0.30 packs per day. She has never used smokeless tobacco. She reports that she drank alcohol. She reports that she does not use drugs.  ALLERGIES: Patient has no known allergies.  MEDICATIONS:  Current Outpatient Medications  Medication Sig Dispense Refill  . aspirin EC 81 MG tablet Take 81 mg by mouth daily.    [name] atorvastatin (LIPITOR) 40 MG tablet Take 20 mg by mouth at bedtime.     . Multiple Vitamin (MULTIVITAMIN WITH MINERALS) TABS tablet Take 1 tablet by mouth daily.    . ondansetron (ZOFRAN ODT) 8 MG disintegrating tablet Take 1 tablet (8 mg total) by mouth every 8 (eight) hours as needed for nausea or vomiting. 30 tablet 6  . palbociclib (IBRANCE) 100 MG capsule Take 1 capsule (100 mg total) by mouth daily with breakfast. Take for 21 days on, 7 days off. Take whole with food. 21 capsule 3  . promethazine (PHENERGAN) 25 MG tablet  Take 1 tablet (25 mg total) by mouth every 8 (eight) hours as needed for nausea. 30 tablet 3  . NITROSTAT 0.4 MG SL tablet Place 0.4 mg under the tongue every 5 (five) minutes as needed for chest pain.     . valsartan-hydrochlorothiazide (DIOVAN HCT) 160-12.5 MG tablet Take 1 tablet by mouth daily. 1/2 tab twice daily (Patient not taking: Reported on 05/27/2018)     No current facility-administered medications for this encounter.     REVIEW OF SYSTEMS: As above   PHYSICAL EXAM:  height is 5\' 8"  (1.727 m) and weight is 153 lb (69.4 kg). Her temperature is 97.7 F (36.5 C). Her blood pressure is 127/58 (abnormal) and her pulse is 78. Her respiration is 18 and oxygen saturation is 96%.   General: Alert and oriented, in no acute distress HEENT: Head is normocephalic. Extraocular movements are intact. Oropharynx and oral cavity is clear. Neck: Neck is supple, no palpable cervical or supraclavicular lymphadenopathy.   Heart: Regular in rate and rhythm with no murmurs, rubs, or gallops. Chest: decreased breath sounds at the left base, with no rhonchi, wheezes, or rales. Lymphatics: see Neck Exam Skin: No concerning lesions. Musculoskeletal: Pain to palpation of the lower thoracic and upper lumbar as well as in the paraspinal regions, lower back bilaterally. Symmetric strength and muscle tone throughout. Psychiatric: Judgment and insight are intact. Affect is appropriate.   ECOG = 1  0 - Asymptomatic (Fully active, able to carry  on all predisease activities without restriction)  1 - Symptomatic but completely ambulatory (Restricted in physically strenuous activity but ambulatory and able to carry out work of a light or sedentary nature. For example, light housework, office work)  2 - Symptomatic, <50% in bed during the day (Ambulatory and capable of all self care but unable to carry out any work activities. Up and about more than 50% of waking hours)  3 - Symptomatic, >50% in bed, but not  bedbound (Capable of only limited self-care, confined to bed or chair 50% or more of waking hours)  4 - Bedbound (Completely disabled. Cannot carry on any self-care. Totally confined to bed or chair)  5 - Death   Eustace Pen MM, Creech RH, Tormey DC, et al. [phone]). "Toxicity and response criteria of the [facility]". [place] Oncol. 5 (6): 649-55   LABORATORY DATA:  Lab Results  Component Value Date   WBC 2.6 (L) 05/18/2018   HGB 10.4 (L) 05/18/2018   HCT 31.1 (L) 05/18/2018   MCV 99.4 05/18/2018   PLT 207 05/18/2018   CMP     Component Value Date/Time   NA 139 05/18/2018 0822   NA 142 05/15/2015 1308   K 3.8 05/18/2018 0822   K 4.3 05/15/2015 1308   CL 102 05/18/2018 0822   CO2 26 05/18/2018 0822   CO2 28 05/15/2015 1308   GLUCOSE 130 05/18/2018 0822   GLUCOSE 88 05/15/2015 1308   BUN 28 (H) 05/18/2018 0822   BUN 16.7 05/15/2015 1308   CREATININE 1.35 (H) 05/18/2018 0822   CREATININE 0.9 05/15/2015 1308   CALCIUM 9.4 05/18/2018 0822   CALCIUM 10.3 05/15/2015 1308   PROT 6.9 05/18/2018 0822   PROT 6.6 05/15/2015 1308   ALBUMIN 3.6 05/18/2018 0822   ALBUMIN 4.2 05/15/2015 1308   AST 20 05/18/2018 0822   AST 36 (H) 05/15/2015 1308   ALT 10 05/18/2018 0822   ALT 48 05/15/2015 1308   ALKPHOS 59 05/18/2018 0822   ALKPHOS 82 05/15/2015 1308   BILITOT 0.4 05/18/2018 0822   BILITOT 0.66 05/15/2015 1308   GFRNONAA 37 (L) 05/18/2018 0822   GFRAA 43 (L) 05/18/2018 9604         RADIOGRAPHY:  As above     IMPRESSION/PLAN:    It was a pleasure meeting the patient today. We discussed the risks, benefits, and side effects of radiotherapy. I recommend palliative radiotherapy to the lower spine.  We discussed that radiation would take approximately 2-3 weeks to complete. We spoke about acute effects including skin irritation and fatigue as well as much less common late effects including internal organ injury or irritation. We spoke about the latest  technology that is used to minimize the risk of late effects for patients undergoing radiotherapy. No guarantees of treatment were given. The patient is enthusiastic about proceeding with treatment. I look forward to participating in the patient's care.  Will refer the patient to Nutritionist for aid with nutrition/weight loss during treatment.   CT simulation/treatment planning on Friday, 05/29/2018 at 10:30 AM with treatments to begin approximately on 06/08/2018.   I spent 30 minutes face to face with the patient and more than 50% of that time was spent in counseling and/or coordination of care.   __________________________________________   Eppie Gibson, MD   This document serves as a record of services personally performed by Eppie Gibson, MD. It was created on her behalf by Steva Colder, a trained medical scribe. The creation  of this record is based on the scribe's personal observations and the provider's statements to them. This document has been checked and approved by the attending provider.

## 2018-05-29 ENCOUNTER — Ambulatory Visit
Admission: RE | Admit: 2018-05-29 | Discharge: 2018-05-29 | Disposition: A | Discharge status: Home / Self Care | Payer: Medicare Other | Source: Ambulatory Visit | Attending: Radiation Oncology | Admitting: Radiation Oncology

## 2018-05-29 ENCOUNTER — Ambulatory Visit: Payer: Medicare Other | Admitting: Hematology and Oncology

## 2018-05-29 ENCOUNTER — Ambulatory Visit: Payer: Medicare Other

## 2018-05-29 DIAGNOSIS — C7951 Secondary malignant neoplasm of bone: Secondary | ICD-10-CM | POA: Insufficient documentation

## 2018-05-29 DIAGNOSIS — Z510 Encounter for antineoplastic radiation therapy: Secondary | ICD-10-CM | POA: Insufficient documentation

## 2018-05-29 NOTE — Progress Notes (Signed)
  Radiation Oncology         [phone] ________________________________  Name: Wendy [name] MRN: [id number]  Date: 05/29/2018  DOB: 1941/10/21  SIMULATION AND TREATMENT PLANNING NOTE, special treatment procedure  outpatient  DIAGNOSIS:     ICD-10-CM   1. Bone metastases ([place]) C79.51     NARRATIVE:  The patient was brought to the [facility].  Identity was confirmed.  All relevant records and images related to the planned course of therapy were reviewed.  The patient freely provided informed written consent to proceed with treatment after reviewing the details related to the planned course of therapy. The consent form was witnessed and verified by the simulation staff.    Then, the patient was set-up in a stable reproducible  supine position for radiation therapy.  CT images were obtained.  Surface markings were placed.  The CT images were loaded into the planning software.    TREATMENT PLANNING NOTE: Treatment planning then occurred.  The radiation prescription was entered and confirmed.    A total of 3 medically necessary complex treatment devices were fabricated and supervised by me, in the form of 2 fields with MLCS to block bowel, kidney. MORE FIELDS WITH MLCs MAY BE ADDED IN DOSIMETRY for dose homogeneity.  I have requested : 3D Simulation  I have requested a DVH of the following structures: CTV, bowel, kidney, cord.  I have ordered:Nutrition Consult  The patient will receive 35 Gy in 14 fractions from T10 through L3 with two fields.  Special Treatment Procedure Note: The patient received prior radiotherapy close to her current fields. There could be some overlap of radiation dose.  Prior regional radiotherapy increases the risk of side effects from treatment. I have considered this in the treatment planning process and have aimed to minimize tissue overlap.  This increases the complexity of this patient's treatment and therefore this constitutes a special  treatment procedure.    -----------------------------------  Eppie Gibson, MD

## 2018-06-02 ENCOUNTER — Telehealth: Payer: Self-pay

## 2018-06-02 NOTE — Telephone Encounter (Signed)
I received a phone call from Ms. Akey today. After much thought she has decided to cancel her upcoming radiation treatments to her spine. She told me "it was not something she wanted to go through right now". 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 I have notified Dr. Isidore Moos and Dr. Sondra Come who is covering for Dr. Isidore Moos this week. I have also notified to [place] of her decision. She will give me a call back if her plans change.

## 2018-06-08 ENCOUNTER — Ambulatory Visit: Payer: Medicare Other | Admitting: Radiation Oncology

## 2018-06-08 DIAGNOSIS — C7951 Secondary malignant neoplasm of bone: Secondary | ICD-10-CM | POA: Diagnosis not present

## 2018-06-09 ENCOUNTER — Ambulatory Visit: Payer: Medicare Other

## 2018-06-09 ENCOUNTER — Inpatient Hospital Stay: Payer: Medicare Other | Attending: Hematology and Oncology

## 2018-06-09 DIAGNOSIS — Z170 Estrogen receptor positive status [ER+]: Secondary | ICD-10-CM | POA: Insufficient documentation

## 2018-06-09 DIAGNOSIS — Z79811 Long term (current) use of aromatase inhibitors: Secondary | ICD-10-CM | POA: Insufficient documentation

## 2018-06-09 DIAGNOSIS — C787 Secondary malignant neoplasm of liver and intrahepatic bile duct: Secondary | ICD-10-CM | POA: Insufficient documentation

## 2018-06-09 DIAGNOSIS — Z5111 Encounter for antineoplastic chemotherapy: Secondary | ICD-10-CM | POA: Insufficient documentation

## 2018-06-09 DIAGNOSIS — C7951 Secondary malignant neoplasm of bone: Secondary | ICD-10-CM | POA: Insufficient documentation

## 2018-06-09 DIAGNOSIS — C771 Secondary and unspecified malignant neoplasm of intrathoracic lymph nodes: Secondary | ICD-10-CM | POA: Insufficient documentation

## 2018-06-09 DIAGNOSIS — C50412 Malignant neoplasm of upper-outer quadrant of left female breast: Secondary | ICD-10-CM | POA: Insufficient documentation

## 2018-06-09 NOTE — Progress Notes (Signed)
Nutrition  Patient was a no show for nutrition appointment today.  Madhavi Hamblen B. Zenia Resides, [place], [place] Registered Dietitian [phone] (pager)

## 2018-06-10 ENCOUNTER — Ambulatory Visit: Payer: Medicare Other

## 2018-06-11 ENCOUNTER — Ambulatory Visit: Payer: Medicare Other

## 2018-06-12 ENCOUNTER — Other Ambulatory Visit: Payer: Self-pay

## 2018-06-12 ENCOUNTER — Ambulatory Visit: Payer: Medicare Other

## 2018-06-12 DIAGNOSIS — Z170 Estrogen receptor positive status [ER+]: Principal | ICD-10-CM

## 2018-06-12 DIAGNOSIS — C7800 Secondary malignant neoplasm of unspecified lung: Secondary | ICD-10-CM

## 2018-06-12 DIAGNOSIS — C50412 Malignant neoplasm of upper-outer quadrant of left female breast: Secondary | ICD-10-CM

## 2018-06-15 ENCOUNTER — Ambulatory Visit: Payer: Medicare Other

## 2018-06-15 ENCOUNTER — Inpatient Hospital Stay: Payer: Medicare Other

## 2018-06-15 DIAGNOSIS — C7800 Secondary malignant neoplasm of unspecified lung: Secondary | ICD-10-CM

## 2018-06-15 DIAGNOSIS — Z5111 Encounter for antineoplastic chemotherapy: Secondary | ICD-10-CM | POA: Diagnosis not present

## 2018-06-15 DIAGNOSIS — C7951 Secondary malignant neoplasm of bone: Secondary | ICD-10-CM | POA: Diagnosis not present

## 2018-06-15 DIAGNOSIS — C787 Secondary malignant neoplasm of liver and intrahepatic bile duct: Secondary | ICD-10-CM | POA: Diagnosis not present

## 2018-06-15 DIAGNOSIS — C50412 Malignant neoplasm of upper-outer quadrant of left female breast: Secondary | ICD-10-CM | POA: Diagnosis not present

## 2018-06-15 DIAGNOSIS — Z170 Estrogen receptor positive status [ER+]: Secondary | ICD-10-CM

## 2018-06-15 DIAGNOSIS — C771 Secondary and unspecified malignant neoplasm of intrathoracic lymph nodes: Secondary | ICD-10-CM | POA: Diagnosis not present

## 2018-06-15 DIAGNOSIS — Z79811 Long term (current) use of aromatase inhibitors: Secondary | ICD-10-CM | POA: Diagnosis not present

## 2018-06-15 LAB — CBC WITH DIFFERENTIAL (CANCER CENTER ONLY)
Basophils Absolute: 0.1 10*3/uL (ref 0.0–0.1)
Basophils Relative: 4 %
Eosinophils Absolute: 0.1 10*3/uL (ref 0.0–0.5)
Eosinophils Relative: 3 %
HCT: 30.3 % — ABNORMAL LOW (ref 34.8–46.6)
Hemoglobin: 10.1 g/dL — ABNORMAL LOW (ref 11.6–15.9)
Lymphocytes Relative: 19 %
Lymphs Abs: 0.4 10*3/uL — ABNORMAL LOW (ref 0.9–3.3)
MCH: 33.3 pg (ref 25.1–34.0)
MCHC: 33.3 g/dL (ref 31.5–36.0)
MCV: 100 fL (ref 79.5–101.0)
Monocytes Absolute: 0.3 10*3/uL (ref 0.1–0.9)
Monocytes Relative: 17 %
Neutro Abs: 1.1 10*3/uL — ABNORMAL LOW (ref 1.5–6.5)
Neutrophils Relative %: 57 %
Platelet Count: 192 10*3/uL (ref 145–400)
RBC: 3.03 MIL/uL — ABNORMAL LOW (ref 3.70–5.45)
RDW: 17.3 % — ABNORMAL HIGH (ref 11.2–14.5)
WBC Count: 2 10*3/uL — ABNORMAL LOW (ref 3.9–10.3)

## 2018-06-15 LAB — CMP (CANCER CENTER ONLY)
ALT: 8 U/L (ref 0–44)
AST: 16 U/L (ref 15–41)
Albumin: 3.7 g/dL (ref 3.5–5.0)
Alkaline Phosphatase: 62 U/L (ref 38–126)
Anion gap: 8 (ref 5–15)
BUN: 23 mg/dL (ref 8–23)
CO2: 28 mmol/L (ref 22–32)
Calcium: 10 mg/dL (ref 8.9–10.3)
Chloride: 103 mmol/L (ref 98–111)
Creatinine: 0.99 mg/dL (ref 0.44–1.00)
GFR, Est AFR Am: >60 mL/min (ref >60)
GFR, Estimated: 54 mL/min — ABNORMAL LOW (ref >60)
Glucose, Bld: 116 mg/dL — ABNORMAL HIGH (ref 70–99)
Potassium: 4.2 mmol/L (ref 3.5–5.1)
Sodium: 139 mmol/L (ref 135–145)
Total Bilirubin: 0.3 mg/dL (ref 0.3–1.2)
Total Protein: 6.9 g/dL (ref 6.5–8.1)

## 2018-06-15 MED ORDER — DENOSUMAB 120 MG/1.7ML ~~LOC~~ SOLN
120.0000 mg | Freq: Once | SUBCUTANEOUS | Status: AC
  Administered 2018-06-15: 120 mg via SUBCUTANEOUS

## 2018-06-15 MED ORDER — FULVESTRANT 250 MG/5ML IM SOLN
500.0000 mg | Freq: Once | INTRAMUSCULAR | Status: AC
  Administered 2018-06-15: 500 mg via INTRAMUSCULAR

## 2018-06-15 MED FILL — IBRANCE 100 MG CAPSULE: 100 | 28 days supply | Qty: 21 | Fill #2

## 2018-06-15 NOTE — Patient Instructions (Signed)
Denosumab injection What is this medicine? DENOSUMAB (den oh sue mab) slows bone breakdown. Prolia is used to treat osteoporosis in women after menopause and in men. Delton See is used to treat a high calcium level due to cancer and to prevent bone fractures and other bone problems caused by multiple myeloma or cancer bone metastases. Delton See is also used to treat giant cell tumor of the bone. This medicine may be used for other purposes; ask your health care provider or pharmacist if you have questions. COMMON BRAND NAME(S): Prolia, XGEVA What should I tell my health care provider before I take this medicine? They need to know if you have any of these conditions: -dental disease -having surgery or tooth extraction -infection -kidney disease -low levels of calcium or Vitamin D in the blood -malnutrition -on hemodialysis -skin conditions or sensitivity -thyroid or parathyroid disease -an unusual reaction to denosumab, other medicines, foods, dyes, or preservatives -pregnant or trying to get pregnant -breast-feeding How should I use this medicine? This medicine is for injection under the skin. It is given by a health care professional in a hospital or clinic setting. If you are getting Prolia, a special MedGuide will be given to you by the pharmacist with each prescription and refill. Be sure to read this information carefully each time. For Prolia, talk to your pediatrician regarding the use of this medicine in children. Special care may be needed. For Delton See, talk to your pediatrician regarding the use of this medicine in children. While this drug may be prescribed for children as young as 13 years for selected conditions, precautions do apply. Overdosage: If you think you have taken too much of this medicine contact a poison control center or emergency room at once. NOTE: This medicine is only for you. Do not share this medicine with others. What if I miss a dose? It is important not to miss your  dose. Call your doctor or health care professional if you are unable to keep an appointment. What may interact with this medicine? Do not take this medicine with any of the following medications: -other medicines containing denosumab This medicine may also interact with the following medications: -medicines that lower your chance of fighting infection -steroid medicines like prednisone or cortisone This list may not describe all possible interactions. Give your health care provider a list of all the medicines, herbs, non-prescription drugs, or dietary supplements you use. Also tell them if you smoke, drink alcohol, or use illegal drugs. Some items may interact with your medicine. What should I watch for while using this medicine? Visit your doctor or health care professional for regular checks on your progress. Your doctor or health care professional may order blood tests and other tests to see how you are doing. Call your doctor or health care professional for advice if you get a fever, chills or sore throat, or other symptoms of a cold or flu. Do not treat yourself. This drug may decrease your body's ability to fight infection. Try to avoid being around people who are sick. You should make sure you get enough calcium and vitamin D while you are taking this medicine, unless your doctor tells you not to. Discuss the foods you eat and the vitamins you take with your health care professional. See your dentist regularly. Brush and floss your teeth as directed. Before you have any dental work done, tell your dentist you are receiving this medicine. Do not become pregnant while taking this medicine or for 5 months after stopping  it. Talk with your doctor or health care professional about your birth control options while taking this medicine. Women should inform their doctor if they wish to become pregnant or think they might be pregnant. There is a potential for serious side effects to an unborn child. Talk  to your health care professional or pharmacist for more information. What side effects may I notice from receiving this medicine? Side effects that you should report to your doctor or health care professional as soon as possible: -allergic reactions like skin rash, itching or hives, swelling of the face, lips, or tongue -bone pain -breathing problems -dizziness -jaw pain, especially after dental work -redness, blistering, peeling of the skin -signs and symptoms of infection like fever or chills; cough; sore throat; pain or trouble passing urine -signs of low calcium like fast heartbeat, muscle cramps or muscle pain; pain, tingling, numbness in the hands or feet; seizures -unusual bleeding or bruising -unusually weak or tired Side effects that usually do not require medical attention (report to your doctor or health care professional if they continue or are bothersome): -constipation -diarrhea -headache -joint pain -loss of appetite -muscle pain -runny nose -tiredness -upset stomach This list may not describe all possible side effects. Call your doctor for medical advice about side effects. You may report side effects to FDA at 1-800-FDA-1088. Where should I keep my medicine? This medicine is only given in a clinic, doctor's office, or other health care setting and will not be stored at home. NOTE: This sheet is a summary. It may not cover all possible information. If you have questions about this medicine, talk to your doctor, pharmacist, or health care provider.  2018 Elsevier/Gold Standard (2016-12-03 19:17:21)   Fulvestrant injection What is this medicine? FULVESTRANT (ful VES trant) blocks the effects of estrogen. It is used to treat breast cancer. This medicine may be used for other purposes; ask your health care provider or pharmacist if you have questions. COMMON BRAND NAME(S): FASLODEX What should I tell my health care provider before I take this medicine? They need to  know if you have any of these conditions: -bleeding problems -liver disease -low levels of platelets in the blood -an unusual or allergic reaction to fulvestrant, other medicines, foods, dyes, or preservatives -pregnant or trying to get pregnant -breast-feeding How should I use this medicine? This medicine is for injection into a muscle. It is usually given by a health care professional in a hospital or clinic setting. Talk to your pediatrician regarding the use of this medicine in children. Special care may be needed. Overdosage: If you think you have taken too much of this medicine contact a poison control center or emergency room at once. NOTE: This medicine is only for you. Do not share this medicine with others. What if I miss a dose? It is important not to miss your dose. Call your doctor or health care professional if you are unable to keep an appointment. What may interact with this medicine? -medicines that treat or prevent blood clots like warfarin, enoxaparin, and dalteparin This list may not describe all possible interactions. Give your health care provider a list of all the medicines, herbs, non-prescription drugs, or dietary supplements you use. Also tell them if you smoke, drink alcohol, or use illegal drugs. Some items may interact with your medicine. What should I watch for while using this medicine? Your condition will be monitored carefully while you are receiving this medicine. You will need important blood work done while you   taking this medicine. Do not become pregnant while taking this medicine or for at least 1 year after stopping it. Women of child-bearing potential will need to have a negative pregnancy test before starting this medicine. Women should inform their doctor if they wish to become pregnant or think they might be pregnant. There is a potential for serious side effects to an unborn child. Men should inform their doctors if they wish to father a child. This  medicine may lower sperm counts. Talk to your health care professional or pharmacist for more information. Do not breast-feed an infant while taking this medicine or for 1 year after the last dose. What side effects may I notice from receiving this medicine? Side effects that you should report to your doctor or health care professional as soon as possible: -allergic reactions like skin rash, itching or hives, swelling of the face, lips, or tongue -feeling faint or lightheaded, falls -pain, tingling, numbness, or weakness in the legs -signs and symptoms of infection like fever or chills; cough; flu-like symptoms; sore throat -vaginal bleeding Side effects that usually do not require medical attention (report to your doctor or health care professional if they continue or are bothersome): -aches, pains -constipation -diarrhea -headache -hot flashes -nausea, vomiting -pain at site where injected -stomach pain This list may not describe all possible side effects. Call your doctor for medical advice about side effects. You may report side effects to FDA at 1-800-FDA-1088. Where should I keep my medicine? This drug is given in a hospital or clinic and will not be stored at home. NOTE: This sheet is a summary. It may not cover all possible information. If you have questions about this medicine, talk to your doctor, pharmacist, or health care provider.  2018 Elsevier/Gold Standard (2015-06-09 11:03:55)

## 2018-06-16 ENCOUNTER — Ambulatory Visit: Payer: Medicare Other

## 2018-06-17 ENCOUNTER — Encounter (HOSPITAL_COMMUNITY)
Admission: RE | Admit: 2018-06-17 | Discharge: 2018-06-17 | Disposition: A | Discharge status: Home / Self Care | Payer: Medicare Other | Source: Ambulatory Visit | Attending: Hematology and Oncology | Admitting: Hematology and Oncology

## 2018-06-17 ENCOUNTER — Ambulatory Visit (HOSPITAL_COMMUNITY)
Admission: RE | Admit: 2018-06-17 | Discharge: 2018-06-17 | Disposition: A | Discharge status: Home / Self Care | Payer: Medicare Other | Source: Ambulatory Visit | Attending: Hematology and Oncology | Admitting: Hematology and Oncology

## 2018-06-17 ENCOUNTER — Ambulatory Visit: Payer: Medicare Other

## 2018-06-17 DIAGNOSIS — J90 Pleural effusion, not elsewhere classified: Secondary | ICD-10-CM | POA: Diagnosis not present

## 2018-06-17 DIAGNOSIS — C50412 Malignant neoplasm of upper-outer quadrant of left female breast: Secondary | ICD-10-CM | POA: Diagnosis not present

## 2018-06-17 DIAGNOSIS — C7951 Secondary malignant neoplasm of bone: Secondary | ICD-10-CM | POA: Insufficient documentation

## 2018-06-17 DIAGNOSIS — Z170 Estrogen receptor positive status [ER+]: Secondary | ICD-10-CM

## 2018-06-17 DIAGNOSIS — C50919 Malignant neoplasm of unspecified site of unspecified female breast: Secondary | ICD-10-CM | POA: Diagnosis not present

## 2018-06-17 DIAGNOSIS — C7800 Secondary malignant neoplasm of unspecified lung: Secondary | ICD-10-CM

## 2018-06-17 MED ORDER — IOPAMIDOL (ISOVUE-300) INJECTION 61%
100.0000 mL | Freq: Once | INTRAVENOUS | Status: AC | PRN
  Administered 2018-06-17: 100 mL via INTRAVENOUS

## 2018-06-17 MED ORDER — TECHNETIUM TC 99M MEDRONATE IV KIT
19.8000 | PACK | Freq: Once | INTRAVENOUS | Status: AC | PRN
  Administered 2018-06-17: 19.8 via INTRAVENOUS

## 2018-06-17 MED ORDER — IOPAMIDOL (ISOVUE-300) INJECTION 61%
INTRAVENOUS | Status: AC
  Filled 2018-06-17: qty 100

## 2018-06-18 ENCOUNTER — Ambulatory Visit: Payer: Medicare Other

## 2018-06-19 ENCOUNTER — Ambulatory Visit: Payer: Medicare Other

## 2018-06-22 ENCOUNTER — Telehealth: Payer: Self-pay | Admitting: Hematology and Oncology

## 2018-06-22 ENCOUNTER — Ambulatory Visit: Payer: Medicare Other

## 2018-06-22 ENCOUNTER — Inpatient Hospital Stay (HOSPITAL_BASED_OUTPATIENT_CLINIC_OR_DEPARTMENT_OTHER): Payer: Medicare Other | Admitting: Hematology and Oncology

## 2018-06-22 DIAGNOSIS — J910 Malignant pleural effusion: Secondary | ICD-10-CM | POA: Diagnosis not present

## 2018-06-22 DIAGNOSIS — C7801 Secondary malignant neoplasm of right lung: Secondary | ICD-10-CM

## 2018-06-22 DIAGNOSIS — Z923 Personal history of irradiation: Secondary | ICD-10-CM

## 2018-06-22 DIAGNOSIS — R634 Abnormal weight loss: Secondary | ICD-10-CM

## 2018-06-22 DIAGNOSIS — Z79811 Long term (current) use of aromatase inhibitors: Secondary | ICD-10-CM | POA: Diagnosis not present

## 2018-06-22 DIAGNOSIS — Z170 Estrogen receptor positive status [ER+]: Secondary | ICD-10-CM | POA: Diagnosis not present

## 2018-06-22 DIAGNOSIS — C7802 Secondary malignant neoplasm of left lung: Secondary | ICD-10-CM

## 2018-06-22 DIAGNOSIS — C787 Secondary malignant neoplasm of liver and intrahepatic bile duct: Secondary | ICD-10-CM | POA: Diagnosis not present

## 2018-06-22 DIAGNOSIS — Z5111 Encounter for antineoplastic chemotherapy: Secondary | ICD-10-CM | POA: Diagnosis not present

## 2018-06-22 DIAGNOSIS — C50412 Malignant neoplasm of upper-outer quadrant of left female breast: Secondary | ICD-10-CM

## 2018-06-22 DIAGNOSIS — C771 Secondary and unspecified malignant neoplasm of intrathoracic lymph nodes: Secondary | ICD-10-CM | POA: Diagnosis not present

## 2018-06-22 DIAGNOSIS — C7951 Secondary malignant neoplasm of bone: Secondary | ICD-10-CM | POA: Diagnosis not present

## 2018-06-22 MED ORDER — OXYCODONE-ACETAMINOPHEN 5-325 MG PO TABS: 1.0000 | ORAL_TABLET | Freq: Three times a day (TID) | ORAL | 0 refills | Status: DC | PRN

## 2018-06-22 NOTE — Telephone Encounter (Signed)
Per 7/9 los.  Patient receiving inj every 4 weeks.  Gave AVS and calendar.

## 2018-06-22 NOTE — Assessment & Plan Note (Signed)
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 She had radiation therapy and has been on antiestrogen therapy with Arimidex 06/28/2014-January 2019  Thoracentesis: Met Breast cancer ER PR Positive and Her 2 Neg PET/CT scan. 01/04/18: New malignant Pleural effusion with small bil pulm nodules,new mild bilateral mediastinal hilar portocaval lymph node metastases, new liver metastases, new diffuse bone metastases. --------------------------------------------------------------------------- Current treatment: Ibrance with Faslodexstarted 12/29/2017 Ibrance toxicities: Fatigue  Weight loss: Zofran will be scheduled every 8 hours.  If this does not work, we may consider giving her Megace.  Faslodex toxicities: Joint and muscle aches. These seem to get better with activity.  Lab review: ANC 1.4. Hemoglobin 10.4  Intractable nausea: She will schedule Zofran every 8 hours and take Phenergan as needed.  CT CAP and bone scan 06/17/2018: Stable appearance of mediastinal lymph nodes, bilateral lung nodules, liver metastases and bone metastases. Radiology review: I discussed the radiology images with the patient.  Based upon stable findings, we will continue with the current treatment.  Based upon intractable nausea issues, I recommended reducing the dosage of her treatment.  Return to clinicin 1 month for follow-up with labs

## 2018-06-22 NOTE — Progress Notes (Signed)
Patient Care Team: [name], [place] [facility] as PCP - General (Family Medicine)  DIAGNOSIS:  Encounter Diagnosis  Name Primary?  . Malignant neoplasm of upper-outer quadrant of left breast in female, estrogen receptor positive ([facility])     SUMMARY OF ONCOLOGIC HISTORY:   Malignant neoplasm of upper-outer quadrant of left breast in female, estrogen receptor positive ([place])   07/28/2013 Mammogram    Spiculated mass at 6:00 position 3 cm, ultrasound revealed 2.6 x 2.1 x 1.5 cm second mass at 12:00 2.1 x 1.8 x 2.1 cm: 2 abnormal lymph nodes left axilla      10/29/2013 Initial Diagnosis    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 [name], T3N2 (5/10 LN positive) stage IIIa IDC grade 1, ER/PR 100%, TSH 14% HER-2 negative PET/CT 12/20/2013 negative for metastases      12/17/2013 - 04/02/2014 Chemotherapy    Taxotere Cytoxan every 3 weeks for 6 cycles      05/11/2014 - 06/27/2014 Radiation Therapy    Adjuvant radiation therapy to chest wall and lymph nodes      07/06/2014 -  Anti-estrogen oral therapy    Arimidex 1 mg daily      12/21/2017 Imaging    Metastatic lesions noted in the lungs bilaterally, bilateral hilar and mediastinal lymph nodes, left pleural effusion, multiple thoracic vertebral metastases, heterogeneous lesions in the liver      12/22/2017 Procedure    Pleural Effusion Thoracentesis: Metastatic Breast cancer ER PR Positive Her 2 Neg       CHIEF COMPLIANT: Follow-up to review the scans  INTERVAL HISTORY: Wendy [name] is a 32-year with above-mentioned some metastatic breast cancer on Ibrance with Faslodex.  She is tolerating the treatment much better since she started taking nausea pills regularly.  She is gained 3 pounds.  Her nausea is completely controlled.  Her scans show stable disease.  REVIEW OF SYSTEMS:    Constitutional: Denies fevers, chills or abnormal weight loss Eyes: Denies blurriness of vision Ears, nose, mouth, throat, and face: Denies mucositis or sore throat Respiratory: Denies cough, dyspnea or wheezes Cardiovascular: Denies palpitation, chest discomfort Gastrointestinal: Nausea is improving Skin: Denies abnormal skin rashes Lymphatics: Denies new lymphadenopathy or easy bruising Neurological:Denies numbness, tingling or new weaknesses Behavioral/Psych: Mood is stable, no new changes  Extremities: No lower extremity edema  All other systems were reviewed with the patient and are negative.  I have reviewed the past medical history, past surgical history, social history and family history with the patient and they are unchanged from previous note.  ALLERGIES:  has No Known Allergies.  MEDICATIONS:  Current Outpatient Medications  Medication Sig Dispense Refill  . aspirin EC 81 MG tablet Take 81 mg by mouth daily.    [name] atorvastatin (LIPITOR) 40 MG tablet Take 20 mg by mouth at bedtime.     . Multiple Vitamin (MULTIVITAMIN WITH MINERALS) TABS tablet Take 1 tablet by mouth daily.    [name] NITROSTAT 0.4 MG SL tablet Place 0.4 mg under the tongue every 5 (five) minutes as needed for chest pain.     [name] ondansetron (ZOFRAN ODT) 8 MG disintegrating tablet Take 1 tablet (8 mg total) by mouth every 8 (eight) hours as needed for nausea or vomiting. 30 tablet 6  . oxyCODONE-acetaminophen (PERCOCET/ROXICET) 5-325 MG tablet Take 1 tablet by mouth every 8 (eight)  hours as needed for severe pain. 30 tablet 0  . palbociclib (IBRANCE) 100 MG capsule Take 1 capsule (100 mg total) by mouth daily with breakfast. Take for 21 days on, 7 days off. Take whole with food. 21 capsule 3  . promethazine (PHENERGAN) 25 MG tablet Take 1 tablet (25 mg total) by mouth every 8 (eight) hours as needed for nausea. 30 tablet 3  . valsartan-hydrochlorothiazide (DIOVAN HCT) 160-12.5 MG tablet Take 1 tablet by mouth daily.  1/2 tab twice daily (Patient not taking: Reported on 05/27/2018)     No current facility-administered medications for this visit.     PHYSICAL EXAMINATION: ECOG PERFORMANCE STATUS: 1 - Symptomatic but completely ambulatory  Vitals:   06/22/18 0834  BP: (!) 118/91  Pulse: 85  Resp: 17  Temp: 98 F (36.7 C)  SpO2: 98%   Filed Weights   06/22/18 0834  Weight: 151 lb 6.4 oz (68.7 kg)    GENERAL:alert, no distress and comfortable SKIN: skin color, texture, turgor are normal, no rashes or significant lesions EYES: normal, Conjunctiva are pink and non-injected, sclera clear OROPHARYNX:no exudate, no erythema and lips, buccal mucosa, and tongue normal  NECK: supple, thyroid normal size, non-tender, without nodularity LYMPH:  no palpable lymphadenopathy in the cervical, axillary or inguinal LUNGS: clear to auscultation and percussion with normal breathing effort HEART: regular rate & rhythm and no murmurs and no lower extremity edema ABDOMEN:abdomen soft, non-tender and normal bowel sounds MUSCULOSKELETAL:no cyanosis of digits and no clubbing  NEURO: alert & oriented x 3 with fluent speech, no focal motor/sensory deficits EXTREMITIES: No lower extremity edema   LABORATORY DATA:  I have reviewed the data as listed CMP Latest Ref Rng & Units 06/15/2018 05/18/2018 04/21/2018  Glucose 70 - 99 mg/dL 116(H) 130 100  BUN 8 - 23 mg/dL 23 28(H) 29(H)  Creatinine 0.44 - 1.00 mg/dL 0.99 1.35(H) 1.21(H)  Sodium 135 - 145 mmol/L 139 139 138  Potassium 3.5 - 5.1 mmol/L 4.2 3.8 4.1  Chloride 98 - 111 mmol/L 103 102 103  CO2 22 - 32 mmol/L 28 26 25  Calcium 8.9 - 10.3 mg/dL 10.0 9.4 9.2  Total Protein 6.5 - 8.1 g/dL 6.9 6.9 6.7  Total Bilirubin 0.3 - 1.2 mg/dL 0.3 0.4 0.3  Alkaline Phos 38 - 126 U/L 62 59 60  AST 15 - 41 U/L 16 20 20  ALT 0 - 44 U/L 8 10 12    Lab Results  Component Value Date   WBC 2.0 (L) 06/15/2018   HGB 10.1 (L) 06/15/2018   HCT 30.3 (L) 06/15/2018   MCV 100.0  06/15/2018   PLT 192 06/15/2018   NEUTROABS 1.1 (L) 06/15/2018    ASSESSMENT & PLAN:  Malignant neoplasm of upper-outer quadrant of left breast in female, 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 She had radiation therapy and has been on antiestrogen therapy with Arimidex 06/28/2014-January 2019  Thoracentesis: Met Breast cancer ER PR Positive and Her 2 Neg PET/CT scan. 01/04/18: New malignant Pleural effusion with small bil pulm nodules,new mild bilateral mediastinal hilar portocaval lymph node metastases, new liver metastases, new diffuse bone metastases. --------------------------------------------------------------------------- Current treatment: Ibrance with Faslodexstarted 12/29/2017 Ibrance toxicities: Fatigue  Weight loss: Zofran will be scheduled every 8 hours.  If this does not work, we may consider giving her Megace.    Faslodex toxicities: Joint and muscle aches. These seem to get better with activity.  Lab review: Patient will come back for labs in 3 weeks   Intractable nausea: She will schedule Zofran every 8 hours and take Phenergan as needed. This is finally working for her. She no longer has nausea when she follows and takes her medication on time.  We discussed about Megace.  We will hold off on that at this time.   CT CAP and bone scan 06/17/2018: Stable appearance of mediastinal lymph nodes, bilateral lung nodules, liver metastases and bone metastases. Radiology review: I discussed the radiology images with the patient.  Based upon stable findings, we will continue with the current treatment.  Based upon intractable nausea issues, I recommended reducing the dosage of her treatment.  Return to clinicin 3 weeks for follow-up with labs    No orders of the defined types were  placed in this encounter.  The patient has a good understanding of the overall plan. she agrees with it. she will call with any problems that may develop before the next visit here.   Harriette Ohara, MD 06/22/18

## 2018-06-23 ENCOUNTER — Ambulatory Visit: Payer: Medicare Other

## 2018-06-24 ENCOUNTER — Ambulatory Visit: Payer: Medicare Other

## 2018-06-25 ENCOUNTER — Ambulatory Visit: Payer: Medicare Other

## 2018-06-26 ENCOUNTER — Ambulatory Visit: Payer: Medicare Other

## 2018-07-09 DIAGNOSIS — H04123 Dry eye syndrome of bilateral lacrimal glands: Secondary | ICD-10-CM | POA: Diagnosis not present

## 2018-07-09 DIAGNOSIS — H43393 Other vitreous opacities, bilateral: Secondary | ICD-10-CM | POA: Diagnosis not present

## 2018-07-09 DIAGNOSIS — H43812 Vitreous degeneration, left eye: Secondary | ICD-10-CM | POA: Diagnosis not present

## 2018-07-09 DIAGNOSIS — H2513 Age-related nuclear cataract, bilateral: Secondary | ICD-10-CM | POA: Diagnosis not present

## 2018-07-10 ENCOUNTER — Other Ambulatory Visit: Payer: Self-pay

## 2018-07-10 DIAGNOSIS — Z170 Estrogen receptor positive status [ER+]: Principal | ICD-10-CM

## 2018-07-10 DIAGNOSIS — C50412 Malignant neoplasm of upper-outer quadrant of left female breast: Secondary | ICD-10-CM

## 2018-07-13 ENCOUNTER — Inpatient Hospital Stay: Payer: Medicare Other

## 2018-07-13 ENCOUNTER — Inpatient Hospital Stay: Payer: Medicare Other | Attending: Hematology and Oncology

## 2018-07-13 ENCOUNTER — Telehealth: Payer: Self-pay | Admitting: Hematology and Oncology

## 2018-07-13 ENCOUNTER — Inpatient Hospital Stay (HOSPITAL_BASED_OUTPATIENT_CLINIC_OR_DEPARTMENT_OTHER): Payer: Medicare Other | Admitting: Hematology and Oncology

## 2018-07-13 VITALS — BP 116/52 | HR 74 | Temp 97.8°F | Resp 18

## 2018-07-13 DIAGNOSIS — C7951 Secondary malignant neoplasm of bone: Secondary | ICD-10-CM | POA: Diagnosis not present

## 2018-07-13 DIAGNOSIS — C7802 Secondary malignant neoplasm of left lung: Secondary | ICD-10-CM

## 2018-07-13 DIAGNOSIS — J910 Malignant pleural effusion: Secondary | ICD-10-CM | POA: Diagnosis not present

## 2018-07-13 DIAGNOSIS — C787 Secondary malignant neoplasm of liver and intrahepatic bile duct: Secondary | ICD-10-CM

## 2018-07-13 DIAGNOSIS — Z79811 Long term (current) use of aromatase inhibitors: Secondary | ICD-10-CM | POA: Diagnosis not present

## 2018-07-13 DIAGNOSIS — Z170 Estrogen receptor positive status [ER+]: Secondary | ICD-10-CM

## 2018-07-13 DIAGNOSIS — Z79899 Other long term (current) drug therapy: Secondary | ICD-10-CM | POA: Insufficient documentation

## 2018-07-13 DIAGNOSIS — C50412 Malignant neoplasm of upper-outer quadrant of left female breast: Secondary | ICD-10-CM

## 2018-07-13 DIAGNOSIS — C7800 Secondary malignant neoplasm of unspecified lung: Secondary | ICD-10-CM

## 2018-07-13 DIAGNOSIS — C7801 Secondary malignant neoplasm of right lung: Secondary | ICD-10-CM | POA: Diagnosis not present

## 2018-07-13 DIAGNOSIS — R5383 Other fatigue: Secondary | ICD-10-CM

## 2018-07-13 DIAGNOSIS — Z5111 Encounter for antineoplastic chemotherapy: Secondary | ICD-10-CM | POA: Diagnosis not present

## 2018-07-13 LAB — CMP (CANCER CENTER ONLY)
ALT: 17 U/L (ref 0–44)
AST: 22 U/L (ref 15–41)
Albumin: 3.4 g/dL — ABNORMAL LOW (ref 3.5–5.0)
Alkaline Phosphatase: 68 U/L (ref 38–126)
Anion gap: 8 (ref 5–15)
BUN: 20 mg/dL (ref 8–23)
CO2: 27 mmol/L (ref 22–32)
Calcium: 8.9 mg/dL (ref 8.9–10.3)
Chloride: 106 mmol/L (ref 98–111)
Creatinine: 1.15 mg/dL — ABNORMAL HIGH (ref 0.44–1.00)
GFR, Est AFR Am: 52 mL/min — ABNORMAL LOW (ref >60)
GFR, Estimated: 45 mL/min — ABNORMAL LOW (ref >60)
Glucose, Bld: 134 mg/dL — ABNORMAL HIGH (ref 70–99)
Potassium: 3.8 mmol/L (ref 3.5–5.1)
Sodium: 141 mmol/L (ref 135–145)
Total Bilirubin: 0.3 mg/dL (ref 0.3–1.2)
Total Protein: 6.7 g/dL (ref 6.5–8.1)

## 2018-07-13 LAB — CBC WITH DIFFERENTIAL (CANCER CENTER ONLY)
Basophils Absolute: 0.1 10*3/uL (ref 0.0–0.1)
Basophils Relative: 3 %
Eosinophils Absolute: 0.1 10*3/uL (ref 0.0–0.5)
Eosinophils Relative: 3 %
HCT: 30.3 % — ABNORMAL LOW (ref 34.8–46.6)
Hemoglobin: 9.7 g/dL — ABNORMAL LOW (ref 11.6–15.9)
Lymphocytes Relative: 26 %
Lymphs Abs: 0.5 10*3/uL — ABNORMAL LOW (ref 0.9–3.3)
MCH: 32.4 pg (ref 25.1–34.0)
MCHC: 32 g/dL (ref 31.5–36.0)
MCV: 101.3 fL — ABNORMAL HIGH (ref 79.5–101.0)
Monocytes Absolute: 0.4 10*3/uL (ref 0.1–0.9)
Monocytes Relative: 19 %
Neutro Abs: 1 10*3/uL — ABNORMAL LOW (ref 1.5–6.5)
Neutrophils Relative %: 49 %
Platelet Count: 145 10*3/uL (ref 145–400)
RBC: 2.99 MIL/uL — ABNORMAL LOW (ref 3.70–5.45)
RDW: 16.4 % — ABNORMAL HIGH (ref 11.2–14.5)
WBC Count: 2 10*3/uL — ABNORMAL LOW (ref 3.9–10.3)

## 2018-07-13 MED ORDER — DENOSUMAB 120 MG/1.7ML ~~LOC~~ SOLN
120.0000 mg | Freq: Once | SUBCUTANEOUS | Status: AC
  Administered 2018-07-13: 120 mg via SUBCUTANEOUS

## 2018-07-13 MED ORDER — DENOSUMAB 120 MG/1.7ML ~~LOC~~ SOLN
SUBCUTANEOUS | Status: AC
  Filled 2018-07-13: qty 1.7

## 2018-07-13 MED ORDER — FULVESTRANT 250 MG/5ML IM SOLN
INTRAMUSCULAR | Status: AC
  Filled 2018-07-13: qty 5

## 2018-07-13 MED ORDER — FULVESTRANT 250 MG/5ML IM SOLN
500.0000 mg | Freq: Once | INTRAMUSCULAR | Status: AC
  Administered 2018-07-13: 500 mg via INTRAMUSCULAR

## 2018-07-13 MED ORDER — MEGESTROL ACETATE 40 MG PO TABS: 40.0000 mg | ORAL_TABLET | Freq: Every day | ORAL | 3 refills | Status: DC

## 2018-07-13 MED FILL — IBRANCE 100 MG CAPSULE: 100 | 28 days supply | Qty: 21 | Fill #3

## 2018-07-13 NOTE — Progress Notes (Signed)
Pt came in today for injection visit, xgeva and Faslodex given, Upon pharmacy verification Pt.was asked if she was taking calcium and vitamin D, Pt. Stated she was not and thought Dr. Lindi [name] told her not too.verified with Dr. Lindi [name] if Pt. Should be taking calcium he stated yes every day. Explained this to Pt.,She verbalized understanding stated she would get calcium vitamin D supplement.

## 2018-07-13 NOTE — Patient Instructions (Signed)
Denosumab injection What is this medicine? DENOSUMAB (den oh sue mab) slows bone breakdown. Prolia is used to treat osteoporosis in women after menopause and in men. Delton See is used to treat a high calcium level due to cancer and to prevent bone fractures and other bone problems caused by multiple myeloma or cancer bone metastases. Delton See is also used to treat giant cell tumor of the bone. This medicine may be used for other purposes; ask your health care provider or pharmacist if you have questions. COMMON BRAND NAME(S): Prolia, XGEVA What should I tell my health care provider before I take this medicine? They need to know if you have any of these conditions: -dental disease -having surgery or tooth extraction -infection -kidney disease -low levels of calcium or Vitamin D in the blood -malnutrition -on hemodialysis -skin conditions or sensitivity -thyroid or parathyroid disease -an unusual reaction to denosumab, other medicines, foods, dyes, or preservatives -pregnant or trying to get pregnant -breast-feeding How should I use this medicine? This medicine is for injection under the skin. It is given by a health care professional in a hospital or clinic setting. If you are getting Prolia, a special MedGuide will be given to you by the pharmacist with each prescription and refill. Be sure to read this information carefully each time. For Prolia, talk to your pediatrician regarding the use of this medicine in children. Special care may be needed. For Delton See, talk to your pediatrician regarding the use of this medicine in children. While this drug may be prescribed for children as young as 13 years for selected conditions, precautions do apply. Overdosage: If you think you have taken too much of this medicine contact a poison control center or emergency room at once. NOTE: This medicine is only for you. Do not share this medicine with others. What if I miss a dose? It is important not to miss your  dose. Call your doctor or health care professional if you are unable to keep an appointment. What may interact with this medicine? Do not take this medicine with any of the following medications: -other medicines containing denosumab This medicine may also interact with the following medications: -medicines that lower your chance of fighting infection -steroid medicines like prednisone or cortisone This list may not describe all possible interactions. Give your health care provider a list of all the medicines, herbs, non-prescription drugs, or dietary supplements you use. Also tell them if you smoke, drink alcohol, or use illegal drugs. Some items may interact with your medicine. What should I watch for while using this medicine? Visit your doctor or health care professional for regular checks on your progress. Your doctor or health care professional may order blood tests and other tests to see how you are doing. Call your doctor or health care professional for advice if you get a fever, chills or sore throat, or other symptoms of a cold or flu. Do not treat yourself. This drug may decrease your body's ability to fight infection. Try to avoid being around people who are sick. You should make sure you get enough calcium and vitamin D while you are taking this medicine, unless your doctor tells you not to. Discuss the foods you eat and the vitamins you take with your health care professional. See your dentist regularly. Brush and floss your teeth as directed. Before you have any dental work done, tell your dentist you are receiving this medicine. Do not become pregnant while taking this medicine or for 5 months after stopping  it. Talk with your doctor or health care professional about your birth control options while taking this medicine. Women should inform their doctor if they wish to become pregnant or think they might be pregnant. There is a potential for serious side effects to an unborn child. Talk  to your health care professional or pharmacist for more information. What side effects may I notice from receiving this medicine? Side effects that you should report to your doctor or health care professional as soon as possible: -allergic reactions like skin rash, itching or hives, swelling of the face, lips, or tongue -bone pain -breathing problems -dizziness -jaw pain, especially after dental work -redness, blistering, peeling of the skin -signs and symptoms of infection like fever or chills; cough; sore throat; pain or trouble passing urine -signs of low calcium like fast heartbeat, muscle cramps or muscle pain; pain, tingling, numbness in the hands or feet; seizures -unusual bleeding or bruising -unusually weak or tired Side effects that usually do not require medical attention (report to your doctor or health care professional if they continue or are bothersome): -constipation -diarrhea -headache -joint pain -loss of appetite -muscle pain -runny nose -tiredness -upset stomach This list may not describe all possible side effects. Call your doctor for medical advice about side effects. You may report side effects to FDA at 1-800-FDA-1088. Where should I keep my medicine? This medicine is only given in a clinic, doctor's office, or other health care setting and will not be stored at home. NOTE: This sheet is a summary. It may not cover all possible information. If you have questions about this medicine, talk to your doctor, pharmacist, or health care provider.  2018 Elsevier/Gold Standard (2016-12-03 19:17:21)   Fulvestrant injection What is this medicine? FULVESTRANT (ful VES trant) blocks the effects of estrogen. It is used to treat breast cancer. This medicine may be used for other purposes; ask your health care provider or pharmacist if you have questions. COMMON BRAND NAME(S): FASLODEX What should I tell my health care provider before I take this medicine? They need to  know if you have any of these conditions: -bleeding problems -liver disease -low levels of platelets in the blood -an unusual or allergic reaction to fulvestrant, other medicines, foods, dyes, or preservatives -pregnant or trying to get pregnant -breast-feeding How should I use this medicine? This medicine is for injection into a muscle. It is usually given by a health care professional in a hospital or clinic setting. Talk to your pediatrician regarding the use of this medicine in children. Special care may be needed. Overdosage: If you think you have taken too much of this medicine contact a poison control center or emergency room at once. NOTE: This medicine is only for you. Do not share this medicine with others. What if I miss a dose? It is important not to miss your dose. Call your doctor or health care professional if you are unable to keep an appointment. What may interact with this medicine? -medicines that treat or prevent blood clots like warfarin, enoxaparin, and dalteparin This list may not describe all possible interactions. Give your health care provider a list of all the medicines, herbs, non-prescription drugs, or dietary supplements you use. Also tell them if you smoke, drink alcohol, or use illegal drugs. Some items may interact with your medicine. What should I watch for while using this medicine? Your condition will be monitored carefully while you are receiving this medicine. You will need important blood work done while you   taking this medicine. Do not become pregnant while taking this medicine or for at least 1 year after stopping it. Women of child-bearing potential will need to have a negative pregnancy test before starting this medicine. Women should inform their doctor if they wish to become pregnant or think they might be pregnant. There is a potential for serious side effects to an unborn child. Men should inform their doctors if they wish to father a child. This  medicine may lower sperm counts. Talk to your health care professional or pharmacist for more information. Do not breast-feed an infant while taking this medicine or for 1 year after the last dose. What side effects may I notice from receiving this medicine? Side effects that you should report to your doctor or health care professional as soon as possible: -allergic reactions like skin rash, itching or hives, swelling of the face, lips, or tongue -feeling faint or lightheaded, falls -pain, tingling, numbness, or weakness in the legs -signs and symptoms of infection like fever or chills; cough; flu-like symptoms; sore throat -vaginal bleeding Side effects that usually do not require medical attention (report to your doctor or health care professional if they continue or are bothersome): -aches, pains -constipation -diarrhea -headache -hot flashes -nausea, vomiting -pain at site where injected -stomach pain This list may not describe all possible side effects. Call your doctor for medical advice about side effects. You may report side effects to FDA at 1-800-FDA-1088. Where should I keep my medicine? This drug is given in a hospital or clinic and will not be stored at home. NOTE: This sheet is a summary. It may not cover all possible information. If you have questions about this medicine, talk to your doctor, pharmacist, or health care provider.  2018 Elsevier/Gold Standard (2015-06-09 11:03:55) Fulvestrant injection What is this medicine? FULVESTRANT (ful VES trant) blocks the effects of estrogen. It is used to treat breast cancer. This medicine may be used for other purposes; ask your health care provider or pharmacist if you have questions. COMMON BRAND NAME(S): FASLODEX What should I tell my health care provider before I take this medicine? They need to know if you have any of these conditions: -bleeding problems -liver disease -low levels of platelets in the blood -an unusual or  allergic reaction to fulvestrant, other medicines, foods, dyes, or preservatives -pregnant or trying to get pregnant -breast-feeding How should I use this medicine? This medicine is for injection into a muscle. It is usually given by a health care professional in a hospital or clinic setting. Talk to your pediatrician regarding the use of this medicine in children. Special care may be needed. Overdosage: If you think you have taken too much of this medicine contact a poison control center or emergency room at once. NOTE: This medicine is only for you. Do not share this medicine with others. What if I miss a dose? It is important not to miss your dose. Call your doctor or health care professional if you are unable to keep an appointment. What may interact with this medicine? -medicines that treat or prevent blood clots like warfarin, enoxaparin, and dalteparin This list may not describe all possible interactions. Give your health care provider a list of all the medicines, herbs, non-prescription drugs, or dietary supplements you use. Also tell them if you smoke, drink alcohol, or use illegal drugs. Some items may interact with your medicine. What should I watch for while using this medicine? Your condition will be monitored carefully while you are   receiving this medicine. You will need important blood work done while you are taking this medicine. Do not become pregnant while taking this medicine or for at least 1 year after stopping it. Women of child-bearing potential will need to have a negative pregnancy test before starting this medicine. Women should inform their doctor if they wish to become pregnant or think they might be pregnant. There is a potential for serious side effects to an unborn child. Men should inform their doctors if they wish to father a child. This medicine may lower sperm counts. Talk to your health care professional or pharmacist for more information. Do not breast-feed an  infant while taking this medicine or for 1 year after the last dose. What side effects may I notice from receiving this medicine? Side effects that you should report to your doctor or health care professional as soon as possible: -allergic reactions like skin rash, itching or hives, swelling of the face, lips, or tongue -feeling faint or lightheaded, falls -pain, tingling, numbness, or weakness in the legs -signs and symptoms of infection like fever or chills; cough; flu-like symptoms; sore throat -vaginal bleeding Side effects that usually do not require medical attention (report to your doctor or health care professional if they continue or are bothersome): -aches, pains -constipation -diarrhea -headache -hot flashes -nausea, vomiting -pain at site where injected -stomach pain This list may not describe all possible side effects. Call your doctor for medical advice about side effects. You may report side effects to FDA at 1-800-FDA-1088. Where should I keep my medicine? This drug is given in a hospital or clinic and will not be stored at home. NOTE: This sheet is a summary. It may not cover all possible information. If you have questions about this medicine, talk to your doctor, pharmacist, or health care provider.  2018 Elsevier/Gold Standard (2015-06-09 11:03:55)  

## 2018-07-13 NOTE — Assessment & Plan Note (Signed)
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 She had radiation therapy and has been on antiestrogen therapy with Arimidex 06/28/2014-January 2019  Thoracentesis: Met Breast cancer ER PR Positive and Her 2 Neg PET/CT scan. 01/04/18: New malignant Pleural effusion with small bil pulm nodules,new mild bilateral mediastinal hilar portocaval lymph node metastases, new liver metastases, new diffuse bone metastases. --------------------------------------------------------------------------- Current treatment: Ibrance with Faslodexstarted 12/29/2017 Ibrance toxicities: Fatigue  Weight loss:Zofran will be scheduled every 8 hours. If this does not work,we may consider giving her Megace.  Faslodex toxicities: Joint and muscle aches. These seem to get better with activity.  Lab review: Patient will come back for labs in 4 weeks

## 2018-07-13 NOTE — Progress Notes (Signed)
Patient Care Team: [name], [place] [facility] as PCP - General (Family Medicine)  DIAGNOSIS:  Encounter Diagnosis  Name Primary?  . Malignant neoplasm of upper-outer quadrant of left breast in female, estrogen receptor positive ([facility])     SUMMARY OF ONCOLOGIC HISTORY:   Malignant neoplasm of upper-outer quadrant of left breast in female, estrogen receptor positive ([name])   07/28/2013 Mammogram    Spiculated mass at 6:00 position 3 cm, ultrasound revealed 2.6 x 2.1 x 1.5 cm second mass at 12:00 2.1 x 1.8 x 2.1 cm: 2 abnormal lymph nodes left axilla    10/29/2013 Initial Diagnosis    Breast cancer of upper-outer quadrant of left female breast: Invasive ductal carcinoma ER 100%, PR 100%, HER-2 negative, Ki-67 14%    11/12/2013 Surgery    Left mastectomy.by Dr. [name], T3N2 (5/10 LN positive) stage IIIa IDC grade 1, ER/PR 100%, TSH 14% HER-2 negative PET/CT 12/20/2013 negative for metastases    12/17/2013 - 04/02/2014 Chemotherapy    Taxotere Cytoxan every 3 weeks for 6 cycles    05/11/2014 - 06/27/2014 Radiation Therapy    Adjuvant radiation therapy to chest wall and lymph nodes    07/06/2014 -  Anti-estrogen oral therapy    Arimidex 1 mg daily    12/21/2017 Imaging    Metastatic lesions noted in the lungs bilaterally, bilateral hilar and mediastinal lymph nodes, left pleural effusion, multiple thoracic vertebral metastases, heterogeneous lesions in the liver    12/22/2017 Procedure    Pleural Effusion Thoracentesis: Metastatic Breast cancer ER PR Positive Her 2 Neg     CHIEF COMPLIANT: Follow-up on Ibrance with Faslodex, weight loss  INTERVAL HISTORY: SHEVETTE [name] is a 77 year old with above-mentioned his metastatic breast cancer is currently on palbociclib with Fosamax and Zometa for bone metastases.  She is here to recheck her weight.  She has not gained any weight in fact she lost 4 pounds.  She does not have any further problems with pleural effusion.  She has  profound fatigue as well as profound decreased appetite.  REVIEW OF SYSTEMS:   Constitutional: Denies fevers, chills or abnormal weight loss Eyes: Denies blurriness of vision Ears, nose, mouth, throat, and face: Denies mucositis or sore throat Respiratory: Denies cough, dyspnea or wheezes Cardiovascular: Denies palpitation, chest discomfort Gastrointestinal:  Denies nausea, heartburn or change in bowel habits Skin: Denies abnormal skin rashes Lymphatics: Denies new lymphadenopathy or easy bruising Neurological:Denies numbness, tingling or new weaknesses Behavioral/Psych: Mood is stable, no new changes  Extremities: No lower extremity edema   All other systems were reviewed with the patient and are negative.  I have reviewed the past medical history, past surgical history, social history and family history with the patient and they are unchanged from previous note.  ALLERGIES:  has No Known Allergies.  MEDICATIONS:  Current Outpatient Medications  Medication Sig Dispense Refill  . aspirin EC 81 MG tablet Take 81 mg by mouth daily.    [name] atorvastatin (LIPITOR) 40 MG tablet Take 20 mg by mouth at bedtime.     . megestrol (MEGACE) 40 MG tablet Take 1 tablet (40 mg total) by mouth daily. 30 tablet 3  . Multiple Vitamin (MULTIVITAMIN WITH MINERALS) TABS tablet Take 1 tablet by mouth daily.    [name] NITROSTAT 0.4 MG SL tablet Place 0.4 mg under the tongue every 5 (five) minutes as needed for chest pain.     [name] ondansetron (ZOFRAN ODT) 8 MG disintegrating tablet Take 1 tablet (8 mg total) by  mouth every 8 (eight) hours as needed for nausea or vomiting. 30 tablet 6  . oxyCODONE-acetaminophen (PERCOCET/ROXICET) 5-325 MG tablet Take 1 tablet by mouth every 8 (eight) hours as needed for severe pain. 30 tablet 0  . palbociclib (IBRANCE) 100 MG capsule Take 1 capsule (100 mg total) by mouth daily with breakfast. Take for 21 days on, 7 days off. Take whole with food. 21 capsule 3  . promethazine  (PHENERGAN) 25 MG tablet Take 1 tablet (25 mg total) by mouth every 8 (eight) hours as needed for nausea. 30 tablet 3  . valsartan-hydrochlorothiazide (DIOVAN HCT) 160-12.5 MG tablet Take 1 tablet by mouth daily. 1/2 tab twice daily (Patient not taking: Reported on 05/27/2018)     No current facility-administered medications for this visit.     PHYSICAL EXAMINATION: ECOG PERFORMANCE STATUS: 1 - Symptomatic but completely ambulatory  Vitals:   07/13/18 1423  BP: (!) 112/44  Pulse: 81  Resp: 18  Temp: (!) 97.5 F (36.4 C)  SpO2: 99%   Filed Weights   07/13/18 1423  Weight: 147 lb 3.2 oz (66.8 kg)    GENERAL:alert, no distress and comfortable SKIN: skin color, texture, turgor are normal, no rashes or significant lesions EYES: normal, Conjunctiva are pink and non-injected, sclera clear OROPHARYNX:no exudate, no erythema and lips, buccal mucosa, and tongue normal  NECK: supple, thyroid normal size, non-tender, without nodularity LYMPH:  no palpable lymphadenopathy in the cervical, axillary or inguinal LUNGS: clear to auscultation and percussion with normal breathing effort HEART: regular rate & rhythm and no murmurs and no lower extremity edema ABDOMEN:abdomen soft, non-tender and normal bowel sounds MUSCULOSKELETAL:no cyanosis of digits and no clubbing  NEURO: alert & oriented x 3 with fluent speech, no focal motor/sensory deficits EXTREMITIES: No lower extremity edema   LABORATORY DATA:  I have reviewed the data as listed CMP Latest Ref Rng & Units 07/13/2018 06/15/2018 05/18/2018  Glucose 70 - 99 mg/dL 134(H) 116(H) 130  BUN 8 - 23 mg/dL 20 23 28(H)  Creatinine 0.44 - 1.00 mg/dL 1.15(H) 0.99 1.35(H)  Sodium 135 - 145 mmol/L 141 139 139  Potassium 3.5 - 5.1 mmol/L 3.8 4.2 3.8  Chloride 98 - 111 mmol/L 106 103 102  CO2 22 - 32 mmol/L _0 Calcium 8.9 - 10.3 mg/dL 8.9 10.0 9.4  Total Protein 6.5 - 8.1 g/dL 6.7 6.9 6.9  Total Bilirubin 0.3 - 1.2 mg/dL 0.3 0.3 0.4  Alkaline  Phos 38 - 126 U/L 68 62 59  AST 15 - 41 U/L _1 ALT 0 - 44 U/L _2 Lab Results  Component Value Date   WBC 2.0 (L) 07/13/2018   HGB 9.7 (L) 07/13/2018   HCT 30.3 (L) 07/13/2018   MCV 101.3 (H) 07/13/2018   PLT 145 07/13/2018   NEUTROABS 1.0 (L) 07/13/2018    ASSESSMENT & PLAN:  Malignant neoplasm of upper-outer quadrant of left breast in female, 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 She had radiation therapy and has been on antiestrogen therapy with Arimidex 06/28/2014-January 2019  Thoracentesis: Met Breast cancer ER PR Positive and Her 2 Neg PET/CT scan. 01/04/18: New malignant Pleural effusion with small bil pulm nodules,new mild bilateral mediastinal hilar portocaval lymph node metastases, new liver metastases, new diffuse bone metastases. --------------------------------------------------------------------------- Current  treatment: Ibrance with Faslodexstarted 12/29/2017 Ibrance toxicities: Fatigue  Weight loss:Zofran will be scheduled every 8 hours. I gave her a prescription for Megace.  She will return back in 1 month.  If she does not get her results from Megace and we can discontinue it.  We may have to reduce the dosage of Ibrance if that was necessary.  Faslodex toxicities: Joint and muscle aches. These seem to get better with activity.  Lab review: Patient will come back for labs in 4 weeks She will receive monthly injections for Faslodex with   Orders Placed This Encounter  Procedures  . CBC with Differential (Cancer Center Only)    Standing Status:   Future    Standing Expiration Date:   07/14/2019  . CMP ([facility] only)    Standing Status:   Future    Standing Expiration Date:   07/14/2019   The patient has a good understanding of the overall  plan. she agrees with it. she will call with any problems that may develop before the next visit here.   Harriette Ohara, MD 07/13/18

## 2018-07-13 NOTE — Telephone Encounter (Signed)
Gave avs and calendar ° °

## 2018-07-16 MED FILL — MEGESTROL 40 MG TABLET: 40 | 30 days supply | Qty: 30 | Fill #0

## 2018-07-29 ENCOUNTER — Telehealth: Payer: Self-pay | Admitting: Pharmacist

## 2018-07-29 NOTE — Telephone Encounter (Signed)
Oral Chemotherapy Pharmacist Encounter   Spoke with patient today to follow up regarding patient's oral chemotherapy medication: Wendy [name]  Original Start date of oral chemotherapy: 12/29/17  Pt is doing well today  Pt reports 0 tablets/doses of Ibrance 100mg  capsules, 1 capsule by mouth once daily with food, taken for 3 weeks on, 1 week off, repeated every 4 weeks, missed in the last month.   Pt reports the following side effects: improved nausea with ondansetron twice daily plus phenergan before bed each night. Patient states she forces herself to have 3 meals daily plus a snack. She states she has not had improvement in appetite since starting MegAce but is agreeable to continue to see if the benefit just has not yet kicked in. No other needs at this time.  Pertinent labs reviewed: OK for continued treatment.  Patient knows to call the office with questions or concerns. [facility] will continue to follow.  Thank you,  Johny Drilling, PharmD, BCPS, BCOP  07/29/2018 2:16 PM [facility] [phone]

## 2018-08-03 ENCOUNTER — Other Ambulatory Visit: Payer: Self-pay | Admitting: Hematology and Oncology

## 2018-08-03 DIAGNOSIS — Z170 Estrogen receptor positive status [ER+]: Principal | ICD-10-CM

## 2018-08-03 DIAGNOSIS — C50412 Malignant neoplasm of upper-outer quadrant of left female breast: Secondary | ICD-10-CM

## 2018-08-09 NOTE — Assessment & Plan Note (Signed)
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 She had radiation therapy and has been on antiestrogen therapy with Arimidex 06/28/2014-January 2019  Thoracentesis: Met Breast cancer ER PR Positive and Her 2 Neg PET/CT scan. 01/04/18: New malignant Pleural effusion with small bil pulm nodules,new mild bilateral mediastinal hilar portocaval lymph node metastases, new liver metastases, new diffuse bone metastases. --------------------------------------------------------------------------- Current treatment: Ibrance with Faslodexstarted 12/29/2017 Ibrance toxicities: Fatigue  Weight loss:Zofran will be scheduled every 8 hours. I gave her a prescription for Megace. Faslodex toxicities: Joint and muscle aches. These seem to get better with activity.  Lab review:Patient will come back for labs in 4 weeks She will receive monthly injections for Faslodex

## 2018-08-10 ENCOUNTER — Other Ambulatory Visit: Payer: Self-pay | Admitting: Hematology and Oncology

## 2018-08-10 ENCOUNTER — Ambulatory Visit: Payer: Medicare Other

## 2018-08-10 ENCOUNTER — Inpatient Hospital Stay: Payer: Medicare Other

## 2018-08-10 ENCOUNTER — Telehealth: Payer: Self-pay | Admitting: Hematology and Oncology

## 2018-08-10 ENCOUNTER — Other Ambulatory Visit: Payer: Self-pay | Admitting: *Deleted

## 2018-08-10 ENCOUNTER — Inpatient Hospital Stay: Payer: Medicare Other | Attending: Hematology and Oncology

## 2018-08-10 ENCOUNTER — Inpatient Hospital Stay (HOSPITAL_BASED_OUTPATIENT_CLINIC_OR_DEPARTMENT_OTHER): Payer: Medicare Other | Admitting: Hematology and Oncology

## 2018-08-10 VITALS — BP 139/55 | HR 87 | Temp 97.9°F | Resp 16 | Ht 68.0 in | Wt 147.4 lb

## 2018-08-10 DIAGNOSIS — C7800 Secondary malignant neoplasm of unspecified lung: Secondary | ICD-10-CM

## 2018-08-10 DIAGNOSIS — C7801 Secondary malignant neoplasm of right lung: Secondary | ICD-10-CM

## 2018-08-10 DIAGNOSIS — C7951 Secondary malignant neoplasm of bone: Secondary | ICD-10-CM | POA: Diagnosis not present

## 2018-08-10 DIAGNOSIS — Z923 Personal history of irradiation: Secondary | ICD-10-CM | POA: Insufficient documentation

## 2018-08-10 DIAGNOSIS — Z9012 Acquired absence of left breast and nipple: Secondary | ICD-10-CM

## 2018-08-10 DIAGNOSIS — Z5111 Encounter for antineoplastic chemotherapy: Secondary | ICD-10-CM | POA: Insufficient documentation

## 2018-08-10 DIAGNOSIS — Z170 Estrogen receptor positive status [ER+]: Secondary | ICD-10-CM

## 2018-08-10 DIAGNOSIS — C50412 Malignant neoplasm of upper-outer quadrant of left female breast: Secondary | ICD-10-CM

## 2018-08-10 DIAGNOSIS — C7802 Secondary malignant neoplasm of left lung: Secondary | ICD-10-CM

## 2018-08-10 DIAGNOSIS — C787 Secondary malignant neoplasm of liver and intrahepatic bile duct: Secondary | ICD-10-CM | POA: Insufficient documentation

## 2018-08-10 DIAGNOSIS — J910 Malignant pleural effusion: Secondary | ICD-10-CM | POA: Diagnosis not present

## 2018-08-10 LAB — CMP (CANCER CENTER ONLY)
ALT: 12 U/L (ref 0–44)
AST: 18 U/L (ref 15–41)
Albumin: 3.3 g/dL — ABNORMAL LOW (ref 3.5–5.0)
Alkaline Phosphatase: 62 U/L (ref 38–126)
Anion gap: 8 (ref 5–15)
BUN: 13 mg/dL (ref 8–23)
CO2: 25 mmol/L (ref 22–32)
Calcium: 9.3 mg/dL (ref 8.9–10.3)
Chloride: 109 mmol/L (ref 98–111)
Creatinine: 0.81 mg/dL (ref 0.44–1.00)
GFR, Est AFR Am: >60 mL/min (ref >60)
GFR, Estimated: >60 mL/min (ref >60)
Glucose, Bld: 87 mg/dL (ref 70–99)
Potassium: 4 mmol/L (ref 3.5–5.1)
Sodium: 142 mmol/L (ref 135–145)
Total Bilirubin: 0.3 mg/dL (ref 0.3–1.2)
Total Protein: 6.6 g/dL (ref 6.5–8.1)

## 2018-08-10 LAB — CBC WITH DIFFERENTIAL (CANCER CENTER ONLY)
Basophils Absolute: 0 10*3/uL (ref 0.0–0.1)
Basophils Relative: 1 %
Eosinophils Absolute: 0 10*3/uL (ref 0.0–0.5)
Eosinophils Relative: 2 %
HCT: 31.9 % — ABNORMAL LOW (ref 34.8–46.6)
Hemoglobin: 10.3 g/dL — ABNORMAL LOW (ref 11.6–15.9)
Lymphocytes Relative: 23 %
Lymphs Abs: 0.5 10*3/uL — ABNORMAL LOW (ref 0.9–3.3)
MCH: 31.9 pg (ref 25.1–34.0)
MCHC: 32.3 g/dL (ref 31.5–36.0)
MCV: 98.8 fL (ref 79.5–101.0)
Monocytes Absolute: 0.4 10*3/uL (ref 0.1–0.9)
Monocytes Relative: 17 %
Neutro Abs: 1.3 10*3/uL — ABNORMAL LOW (ref 1.5–6.5)
Neutrophils Relative %: 57 %
Platelet Count: 203 10*3/uL (ref 145–400)
RBC: 3.23 MIL/uL — ABNORMAL LOW (ref 3.70–5.45)
RDW: 16.6 % — ABNORMAL HIGH (ref 11.2–14.5)
WBC Count: 2.3 10*3/uL — ABNORMAL LOW (ref 3.9–10.3)

## 2018-08-10 MED ORDER — DENOSUMAB 120 MG/1.7ML ~~LOC~~ SOLN
SUBCUTANEOUS | Status: AC
  Filled 2018-08-10: qty 1.7

## 2018-08-10 MED ORDER — DENOSUMAB 120 MG/1.7ML ~~LOC~~ SOLN
120.0000 mg | Freq: Once | SUBCUTANEOUS | Status: AC
  Administered 2018-08-10: 120 mg via SUBCUTANEOUS

## 2018-08-10 MED ORDER — FULVESTRANT 250 MG/5ML IM SOLN
INTRAMUSCULAR | Status: AC
  Filled 2018-08-10: qty 10

## 2018-08-10 MED ORDER — FULVESTRANT 250 MG/5ML IM SOLN
500.0000 mg | Freq: Once | INTRAMUSCULAR | Status: AC
  Administered 2018-08-10: 500 mg via INTRAMUSCULAR

## 2018-08-10 MED ORDER — NITROGLYCERIN 0.4 MG SL SUBL: 0.4000 mg | SUBLINGUAL_TABLET | SUBLINGUAL | 1 refills | Status: DC | PRN

## 2018-08-10 MED FILL — IBRANCE 100 MG CAPSULE: 100 | 28 days supply | Qty: 21 | Fill #0

## 2018-08-10 MED FILL — MEGESTROL 40 MG TABLET: 40 | 30 days supply | Qty: 30 | Fill #1

## 2018-08-10 NOTE — Telephone Encounter (Signed)
Gave avs and calendar ° °

## 2018-08-10 NOTE — Progress Notes (Signed)
Patient Care Team: [name], [place] At as PCP - General (Family Medicine)  DIAGNOSIS:  Encounter Diagnoses  Name Primary?  . Malignant neoplasm metastatic to lung, unspecified laterality ([place]) Yes  . 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 [name], T3N2 (5/10 LN positive) stage IIIa IDC grade 1, ER/PR 100%, TSH 14% HER-2 negative PET/CT 12/20/2013 negative for metastases    12/17/2013 - 04/02/2014 Chemotherapy    Taxotere Cytoxan every 3 weeks for 6 cycles    05/11/2014 - 06/27/2014 Radiation Therapy    Adjuvant radiation therapy to chest wall and lymph nodes    07/06/2014 -  Anti-estrogen oral therapy    Arimidex 1 mg daily    12/21/2017 Imaging    Metastatic lesions noted in the lungs bilaterally, bilateral hilar and mediastinal lymph nodes, left pleural effusion, multiple thoracic vertebral metastases, heterogeneous lesions in the liver    12/22/2017 Procedure    Pleural Effusion Thoracentesis: Metastatic Breast cancer ER PR Positive Her 2 Neg     CHIEF COMPLIANT: Follow-up on Ibrance with Faslodex  INTERVAL HISTORY: ABRIELLE FINCK is a 77 year old with above-mentioned his metastatic breast cancer currently on Ibrance with Faslodex.  She has not lost any weight.  She is forcing herself to eat but Megace appears to be helping her slightly.  She does have shortness of  breath to exertion.  REVIEW OF SYSTEMS:   Constitutional: Denies fevers, chills or abnormal weight loss Eyes: Denies blurriness of vision Ears, nose, mouth, throat, and face: Denies mucositis or sore throat Respiratory: Shortness of breath to exertion Cardiovascular: Denies palpitation, chest discomfort Gastrointestinal:  Denies nausea, heartburn or change in bowel habits Skin: Denies abnormal skin rashes Lymphatics: Denies new lymphadenopathy or easy bruising Neurological:Denies numbness, tingling or new weaknesses Behavioral/Psych: Mood is stable, no new changes  Extremities: No lower extremity edema   All other systems were reviewed with the patient and are negative.  I have reviewed the past medical history, past surgical history, social history and family history with the patient and they are unchanged from previous note.  ALLERGIES:  has No Known Allergies.  MEDICATIONS:  Current Outpatient Medications  Medication Sig Dispense Refill  . aspirin EC 81 MG tablet Take 81 mg by mouth daily.    [name] atorvastatin (LIPITOR) 40 MG tablet Take 20 mg by mouth at bedtime.     Leslee Home 100 MG capsule TAKE 1 CAPSULE (100 MG TOTAL) BY MOUTH DAILY WITH BREAKFAST. TAKE FOR 21 DAYS ON, 7 DAYS OFF. TAKE WHOLE WITH FOOD. 21 capsule 3  . megestrol (MEGACE) 40 MG tablet Take 1 tablet (40 mg total) by mouth daily. 30 tablet 3  . Multiple Vitamin (MULTIVITAMIN WITH MINERALS) TABS tablet Take 1 tablet by mouth daily.    [name] NITROSTAT 0.4 MG SL tablet Place 0.4 mg under the tongue every 5 (five) minutes as  needed for chest pain.     [name] ondansetron (ZOFRAN ODT) 8 MG disintegrating tablet Take 1 tablet (8 mg total) by mouth every 8 (eight) hours as needed for nausea or vomiting. 30 tablet 6  . oxyCODONE-acetaminophen (PERCOCET/ROXICET) 5-325 MG tablet Take 1 tablet by mouth every 8 (eight) hours as needed for severe pain. 30 tablet 0  . promethazine (PHENERGAN) 25 MG tablet Take 1 tablet (25 mg total) by mouth  every 8 (eight) hours as needed for nausea. 30 tablet 3  . valsartan-hydrochlorothiazide (DIOVAN HCT) 160-12.5 MG tablet Take 1 tablet by mouth daily. 1/2 tab twice daily (Patient not taking: Reported on 05/27/2018)     No current facility-administered medications for this visit.     PHYSICAL EXAMINATION: ECOG PERFORMANCE STATUS: 1 - Symptomatic but completely ambulatory  Vitals:   08/10/18 0930  BP: (!) 139/55  Pulse: 87  Resp: 16  Temp: 97.9 F (36.6 C)  SpO2: 100%   Filed Weights   08/10/18 0930  Weight: 147 lb 6.4 oz (66.9 kg)    GENERAL:alert, no distress and comfortable SKIN: skin color, texture, turgor are normal, no rashes or significant lesions EYES: normal, Conjunctiva are pink and non-injected, sclera clear OROPHARYNX:no exudate, no erythema and lips, buccal mucosa, and tongue normal  NECK: supple, thyroid normal size, non-tender, without nodularity LYMPH:  no palpable lymphadenopathy in the cervical, axillary or inguinal LUNGS: clear to auscultation and percussion with normal breathing effort HEART: regular rate & rhythm and no murmurs and no lower extremity edema ABDOMEN:abdomen soft, non-tender and normal bowel sounds MUSCULOSKELETAL:no cyanosis of digits and no clubbing  NEURO: alert & oriented x 3 with fluent speech, no focal motor/sensory deficits EXTREMITIES: No lower extremity edema   LABORATORY DATA:  I have reviewed the data as listed CMP Latest Ref Rng & Units 07/13/2018 06/15/2018 05/18/2018  Glucose 70 - 99 mg/dL 134(H) 116(H) 130  BUN 8 - 23 mg/dL 20 23 28(H)  Creatinine 0.44 - 1.00 mg/dL 1.15(H) 0.99 1.35(H)  Sodium 135 - 145 mmol/L 141 139 139  Potassium 3.5 - 5.1 mmol/L 3.8 4.2 3.8  Chloride 98 - 111 mmol/L 106 103 102  CO2 22 - 32 mmol/L _0 Calcium 8.9 - 10.3 mg/dL 8.9 10.0 9.4  Total Protein 6.5 - 8.1 g/dL 6.7 6.9 6.9  Total Bilirubin 0.3 - 1.2 mg/dL 0.3 0.3 0.4  Alkaline Phos 38 - 126 U/L 68 62 59  AST 15 - 41 U/L _1 ALT 0 -  44 U/L _2 Lab Results  Component Value Date   WBC 2.0 (L) 07/13/2018   HGB 9.7 (L) 07/13/2018   HCT 30.3 (L) 07/13/2018   MCV 101.3 (H) 07/13/2018   PLT 145 07/13/2018   NEUTROABS 1.0 (L) 07/13/2018    ASSESSMENT & PLAN:  Malignant neoplasm of upper-outer quadrant of left breast in female, 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 She had radiation therapy and has been on antiestrogen therapy with Arimidex 06/28/2014-January 2019  Thoracentesis: Met Breast cancer ER PR Positive and Her 2 Neg PET/CT scan. 01/04/18: New malignant Pleural effusion with small bil pulm nodules,new mild bilateral mediastinal hilar portocaval lymph node metastases, new liver metastases, new diffuse bone metastases. --------------------------------------------------------------------------- Current treatment: Ibrance with Faslodexstarted 12/29/2017 Ibrance toxicities: Fatigue Shortness of breath  to exertion  Weight loss:Zofran will be scheduled every 8 hours.  Patient is currently on Megace.  She has not lost any further weight. Faslodex toxicities: Joint and muscle aches. These seem to get better with activity.  Lab review:Patient will come back for labs in 4 weeks with scans labs and injection She will receive monthly injections for Faslodex     No orders of the defined types were placed in this encounter.  The patient has a good understanding of the overall plan. she agrees with it. she will call with any problems that may develop before the next visit here.   Harriette Ohara, MD 08/10/18

## 2018-08-16 ENCOUNTER — Emergency Department (HOSPITAL_COMMUNITY)
Admission: EM | Admit: 2018-08-16 | Discharge: 2018-08-16 | Disposition: A | Discharge status: Home / Self Care | Payer: Medicare Other | Attending: Emergency Medicine | Admitting: Emergency Medicine

## 2018-08-16 ENCOUNTER — Other Ambulatory Visit: Payer: Self-pay

## 2018-08-16 ENCOUNTER — Emergency Department (HOSPITAL_COMMUNITY): Payer: Medicare Other

## 2018-08-16 ENCOUNTER — Encounter (HOSPITAL_COMMUNITY): Payer: Self-pay | Admitting: Emergency Medicine

## 2018-08-16 DIAGNOSIS — Z7982 Long term (current) use of aspirin: Secondary | ICD-10-CM | POA: Diagnosis not present

## 2018-08-16 DIAGNOSIS — Z87891 Personal history of nicotine dependence: Secondary | ICD-10-CM | POA: Insufficient documentation

## 2018-08-16 DIAGNOSIS — Z79899 Other long term (current) drug therapy: Secondary | ICD-10-CM | POA: Insufficient documentation

## 2018-08-16 DIAGNOSIS — R1013 Epigastric pain: Secondary | ICD-10-CM | POA: Insufficient documentation

## 2018-08-16 DIAGNOSIS — J90 Pleural effusion, not elsewhere classified: Secondary | ICD-10-CM | POA: Diagnosis not present

## 2018-08-16 DIAGNOSIS — N183 Chronic kidney disease, stage 3 (moderate): Secondary | ICD-10-CM | POA: Diagnosis not present

## 2018-08-16 DIAGNOSIS — C787 Secondary malignant neoplasm of liver and intrahepatic bile duct: Secondary | ICD-10-CM | POA: Diagnosis not present

## 2018-08-16 DIAGNOSIS — R918 Other nonspecific abnormal finding of lung field: Secondary | ICD-10-CM | POA: Diagnosis not present

## 2018-08-16 DIAGNOSIS — R0603 Acute respiratory distress: Secondary | ICD-10-CM | POA: Diagnosis not present

## 2018-08-16 DIAGNOSIS — C50919 Malignant neoplasm of unspecified site of unspecified female breast: Secondary | ICD-10-CM | POA: Diagnosis not present

## 2018-08-16 DIAGNOSIS — I129 Hypertensive chronic kidney disease with stage 1 through stage 4 chronic kidney disease, or unspecified chronic kidney disease: Secondary | ICD-10-CM | POA: Diagnosis not present

## 2018-08-16 DIAGNOSIS — R0602 Shortness of breath: Secondary | ICD-10-CM | POA: Diagnosis not present

## 2018-08-16 DIAGNOSIS — I2510 Atherosclerotic heart disease of native coronary artery without angina pectoris: Secondary | ICD-10-CM | POA: Insufficient documentation

## 2018-08-16 LAB — COMPREHENSIVE METABOLIC PANEL
ALT: 18 U/L (ref 0–44)
AST: 21 U/L (ref 15–41)
Albumin: 3.7 g/dL (ref 3.5–5.0)
Alkaline Phosphatase: 61 U/L (ref 38–126)
Anion gap: 11 (ref 5–15)
BUN: 14 mg/dL (ref 8–23)
CO2: 23 mmol/L (ref 22–32)
Calcium: 9.8 mg/dL (ref 8.9–10.3)
Chloride: 106 mmol/L (ref 98–111)
Creatinine, Ser: 0.84 mg/dL (ref 0.44–1.00)
GFR calc Af Amer: >60 mL/min (ref >60)
GFR calc non Af Amer: >60 mL/min (ref >60)
Glucose, Bld: 96 mg/dL (ref 70–99)
Potassium: 4.2 mmol/L (ref 3.5–5.1)
Sodium: 140 mmol/L (ref 135–145)
Total Bilirubin: 0.5 mg/dL (ref 0.3–1.2)
Total Protein: 7 g/dL (ref 6.5–8.1)

## 2018-08-16 LAB — CBC WITH DIFFERENTIAL/PLATELET
Basophils Absolute: 0.1 10*3/uL (ref 0.0–0.1)
Basophils Relative: 3 %
Eosinophils Absolute: 0.1 10*3/uL (ref 0.0–0.7)
Eosinophils Relative: 4 %
HCT: 33.7 % — ABNORMAL LOW (ref 36.0–46.0)
Hemoglobin: 10.9 g/dL — ABNORMAL LOW (ref 12.0–15.0)
Lymphocytes Relative: 24 %
Lymphs Abs: 0.6 10*3/uL — ABNORMAL LOW (ref 0.7–4.0)
MCH: 31.7 pg (ref 26.0–34.0)
MCHC: 32.3 g/dL (ref 30.0–36.0)
MCV: 98 fL (ref 78.0–100.0)
Monocytes Absolute: 0.2 10*3/uL (ref 0.1–1.0)
Monocytes Relative: 8 %
Neutro Abs: 1.6 10*3/uL — ABNORMAL LOW (ref 1.7–7.7)
Neutrophils Relative %: 61 %
Platelets: 281 10*3/uL (ref 150–400)
RBC: 3.44 MIL/uL — ABNORMAL LOW (ref 3.87–5.11)
RDW: 16 % — ABNORMAL HIGH (ref 11.5–15.5)
WBC: 2.6 10*3/uL — ABNORMAL LOW (ref 4.0–10.5)

## 2018-08-16 LAB — TROPONIN I
Troponin I: <0.03 ng/mL (ref <0.03)
Troponin I: <0.03 ng/mL (ref <0.03)

## 2018-08-16 LAB — BRAIN NATRIURETIC PEPTIDE: B Natriuretic Peptide: 105.7 pg/mL — ABNORMAL HIGH (ref 0.0–100.0)

## 2018-08-16 MED ORDER — IOPAMIDOL (ISOVUE-370) INJECTION 76%
INTRAVENOUS | Status: AC
  Administered 2018-08-16: 100 mL
  Filled 2018-08-16: qty 100

## 2018-08-16 MED ORDER — ONDANSETRON HCL 4 MG/2ML IJ SOLN
4.0000 mg | Freq: Once | INTRAMUSCULAR | Status: AC
  Administered 2018-08-16: 4 mg via INTRAVENOUS
  Filled 2018-08-16: qty 2

## 2018-08-16 MED ORDER — FUROSEMIDE 10 MG/ML IJ SOLN
20.0000 mg | Freq: Once | INTRAMUSCULAR | Status: AC
  Administered 2018-08-16: 20 mg via INTRAVENOUS
  Filled 2018-08-16: qty 4

## 2018-08-16 NOTE — ED Notes (Signed)
ED Provider at bedside. 

## 2018-08-16 NOTE — ED Triage Notes (Signed)
Pt with intermittent shortness of breath, but states the shortness of breath worsened upon waking this morning. Pt 99% RA in triage.

## 2018-08-16 NOTE — ED Notes (Signed)
Pt provided pillow.

## 2018-08-16 NOTE — ED Notes (Signed)
Patient is resting comfortably. 

## 2018-08-16 NOTE — ED Provider Notes (Signed)
[facility] DEPT Provider Note   CSN: [id number] Arrival date & time: 08/16/18  1116     History   Chief Complaint Chief Complaint  Patient presents with  . Shortness of Breath    HPI Wendy [name] is a 77 y.o. female.  HPI 77 year old female with past medical history of previous MI, hypertension, hyperlipidemia, metastatic breast cancer with known lung metastases here with shortness of breath.  Patient states that she has had intermittent episodes of shortness of breath for several weeks to months.  Patient states that these episodes seem to occur when she lies flat and are associated with coughing and occasional frothy, clear to white sputum production.  Symptoms seem to improve when she sits up and moves around.  She has not had any alleviating factors or specific aggravating factors other than lying flat.  She states she feels fine in between his episodes.  She has a questionable pressure-like sensation when she is feeling short of breath, but denies any overt chest pain.  Denies any epigastric pain.  Denies any fevers or chills.  Past Medical History:  Diagnosis Date  . Breast cancer    left breast  . CAD (coronary artery disease)   . Hyperlipidemia   . Hypertension   . Myocardial infarction ([place]) 11/22/04  . Peripheral vascular disease ([place])    atherosclerosis carotid artery-mild    Patient Active Problem List   Diagnosis Date Noted  . Bone metastases ([place]) 05/27/2018  . Metastatic cancer to lung ([place]) 12/22/2017  . Streptococcal bacteremia 09/24/2017  . CKD (chronic kidney disease), stage III ([place]) 09/22/2017  . Sepsis ([place]) 09/22/2017  . Lower urinary tract infectious disease 09/22/2017  . Chest pain 09/22/2017  . Left arm cellulitis 09/22/2017  . Obesity (BMI 30-39.9) 01/06/2014  . Hyperlipidemia 01/06/2014  . CAD (coronary artery disease)   . Essential hypertension   . Carotid artery disease ([place])   . Malignant neoplasm of  upper-outer quadrant of left breast in female, estrogen receptor positive ([place]) 10/29/2013    Past Surgical History:  Procedure Laterality Date  . ABDOMINAL HYSTERECTOMY  1982  . KNEE ARTHROSCOPY     knee  . MASTECTOMY MODIFIED RADICAL Left 11/12/2013   Procedure: MASTECTOMY MODIFIED RADICAL;  Surgeon: Rolm Bookbinder, MD;  Location: [facility] ORS;  Service: General;  Laterality: Left;  . 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 PERCUTANEOUS CORONARY STENT INTERVENTION (PCI-S)  2005  . PORTACATH PLACEMENT Right 12/16/2013   Procedure: INSERTION PORT-A-CATH;  Surgeon: Rolm Bookbinder, MD;  Location: [facility];  Service: General;  Laterality: Right;  . TEE WITHOUT CARDIOVERSION N/A 09/26/2017   Procedure: TRANSESOPHAGEAL ECHOCARDIOGRAM (TEE);  Surgeon: Dorothy Spark, MD;  Location: [facility] ENDOSCOPY;  Service: Cardiovascular;  Laterality: N/A;     OB History   None      Home Medications    Prior to Admission medications   Medication Sig Start Date End Date Taking? Authorizing Provider  acetaminophen (TYLENOL) 500 MG tablet Take 1,000 mg by mouth every 6 (six) hours as needed for mild pain.   Yes [provider]  aspirin EC 81 MG tablet Take 81 mg by mouth daily.   Yes [provider]  atorvastatin (LIPITOR) 40 MG tablet Take 20 mg by mouth at bedtime.    Yes [provider]  IBRANCE 100 MG capsule TAKE 1 CAPSULE (100 MG TOTAL) BY MOUTH DAILY WITH BREAKFAST. TAKE FOR 21 DAYS ON, 7 DAYS OFF.  TAKE WHOLE WITH FOOD. 08/04/18  Yes [name] Lose, MD  megestrol (MEGACE) 40 MG tablet Take 1 tablet (40 mg total) by mouth daily. 07/13/18  Yes [name] Lose, MD  Multiple Vitamin (MULTIVITAMIN WITH MINERALS) TABS tablet Take 1 tablet by mouth daily.   Yes [provider]  nitroGLYCERIN (NITROSTAT) 0.4 MG SL tablet Place 1 tablet (0.4 mg total) under the tongue every 5 (five) minutes as needed for chest pain. 08/10/18  Yes [name] Lose, MD  ondansetron  (ZOFRAN ODT) 8 MG disintegrating tablet Take 1 tablet (8 mg total) by mouth every 8 (eight) hours as needed for nausea or vomiting. 01/12/18  Yes [name] Lose, MD  oxyCODONE-acetaminophen (PERCOCET/ROXICET) 5-325 MG tablet Take 1 tablet by mouth every 8 (eight) hours as needed for severe pain. 06/22/18  Yes [name] Lose, MD  promethazine (PHENERGAN) 25 MG tablet Take 1 tablet (25 mg total) by mouth every 8 (eight) hours as needed for nausea. 03/23/18  Yes [name] Lose, MD  valsartan-hydrochlorothiazide (DIOVAN HCT) 160-12.5 MG tablet Take 1 tablet by mouth daily. 1/2 tab twice daily Patient not taking: Reported on 05/27/2018 05/29/17   [name] Lose, MD    Family History Family History  Problem Relation Age of Onset  . Emphysema Mother   . Heart attack Father     Social History Social History   Tobacco Use  . Smoking status: Former Smoker    Packs/day: 0.30    Types: Cigarettes    Last attempt to quit: 11/09/1983    Years since quitting: 34.7  . Smokeless tobacco: Never Used  Substance Use Topics  . Alcohol use: Not Currently  . Drug use: No     Allergies   Patient has no known allergies.   Review of Systems Review of Systems  Constitutional: Positive for fatigue. Negative for chills and fever.  HENT: Negative for congestion, rhinorrhea and sore throat.   Eyes: Negative for visual disturbance.  Respiratory: Positive for shortness of breath. Negative for cough and wheezing.   Cardiovascular: Negative for chest pain and leg swelling.  Gastrointestinal: Negative for abdominal pain, diarrhea, nausea and vomiting.  Genitourinary: Negative for dysuria, flank pain, vaginal bleeding and vaginal discharge.  Musculoskeletal: Negative for neck pain.  Skin: Negative for rash.  Allergic/Immunologic: Negative for immunocompromised state.  Neurological: Positive for weakness. Negative for syncope and headaches.  Hematological: Does not bruise/bleed easily.  All other systems reviewed and  are negative.    Physical Exam Updated Vital Signs BP (!) 167/73   Pulse 85   Temp 97.6 F (36.4 C) (Oral)   Resp 18   SpO2 98%   Physical Exam  Constitutional: She is oriented to person, place, and time. She appears well-developed and well-nourished. No distress.  HENT:  Head: Normocephalic and atraumatic.  Eyes: Conjunctivae are normal.  Neck: Neck supple.  Cardiovascular: Normal rate, regular rhythm and normal heart sounds. Exam reveals no friction rub.  No murmur heard. Pulmonary/Chest: Effort normal. No tachypnea. No respiratory distress. She has decreased breath sounds in the left middle field and the left lower field. She has no wheezes. She has rales in the left middle field and the left lower field.  Abdominal: She exhibits no distension.  Musculoskeletal: She exhibits no edema.  Neurological: She is alert and oriented to person, place, and time. She exhibits normal muscle tone.  Skin: Skin is warm. Capillary refill takes less than 2 seconds.  Psychiatric: She has a normal mood and affect.  Nursing note and vitals reviewed.  ED Treatments / Results  Labs (all labs ordered are listed, but only abnormal results are displayed) Labs Reviewed  CBC WITH DIFFERENTIAL/PLATELET - Abnormal; Notable for the following components:      Result Value   WBC 2.6 (*)    RBC 3.44 (*)    Hemoglobin 10.9 (*)    HCT 33.7 (*)    RDW 16.0 (*)    Neutro Abs 1.6 (*)    Lymphs Abs 0.6 (*)    All other components within normal limits  BRAIN NATRIURETIC PEPTIDE - Abnormal; Notable for the following components:   B Natriuretic Peptide 105.7 (*)    All other components within normal limits  COMPREHENSIVE METABOLIC PANEL  TROPONIN I  TROPONIN I    EKG EKG Interpretation  Date/Time:  Sunday August 16 2018 11:29:37 EDT Ventricular Rate:  82 PR Interval:    QRS Duration: 95 QT Interval:  385 QTC Calculation: 450 R Axis:   68 Text Interpretation:  Sinus rhythm Baseline wander  in lead(s) V2 V4 No significant change since last tracing Confirmed by Duffy Bruce [phone]) on 08/16/2018 2:25:45 PM   Radiology Dg Chest 2 View  Result Date: 08/16/2018 CLINICAL DATA:  Pt with shortness of breath. Hx of metastatic cancer that has spread to her lungs and bone. EXAM: CHEST - 2 VIEW COMPARISON:  12/23/2017 FINDINGS: Cardiac silhouette is normal in size. No mediastinal or hilar masses. No convincing adenopathy. There is volume loss on the left. Opacities noted at the left lung base in part to a small pleural effusion. These findings are stable from the prior chest radiograph. Remainder of the left lung is clear. Right lung is hyperexpanded but clear. No right pleural effusion. No pneumothorax. Changes from left breast surgery are stable. Skeletal structures are demineralized but intact. IMPRESSION: 1. No acute cardiopulmonary disease. 2. Left lung base opacity consistent with atelectasis, neoplastic disease or likely a combination. There is a small associated pleural effusion. This appearance is similar to the most recent prior chest radiograph. Electronically Signed   By: Lajean Manes M.D.   On: 08/16/2018 12:47   Ct Angio Chest Pe W And/or Wo Contrast  Result Date: 08/16/2018 CLINICAL DATA:  Acute shortness of breath, epigastric pain and chest pressure. Abdominal distension. Breast cancer with known liver metastases. EXAM: CT ANGIOGRAPHY CHEST CT ABDOMEN AND PELVIS WITH CONTRAST TECHNIQUE: Multidetector CT imaging of the chest was performed using the standard protocol during bolus administration of intravenous contrast. Multiplanar CT image reconstructions and MIPs were obtained to evaluate the vascular anatomy. Multidetector CT imaging of the abdomen and pelvis was performed using the standard protocol during bolus administration of intravenous contrast. CONTRAST:  165mL ISOVUE-370 IOPAMIDOL (ISOVUE-370) INJECTION 76% COMPARISON:  CT chest abdomen pelvis dated 06/17/2018 FINDINGS: CTA  CHEST FINDINGS Cardiovascular: Satisfactory opacification the bilateral pulmonary arteries to the lobar level. No evidence of pulmonary embolism. No evidence of thoracic aortic aneurysm or dissection. Atherosclerotic calcifications of the aortic arch. The heart is normal in size.  No pericardial effusion. Three vessel coronary atherosclerosis. Mediastinum/Nodes: Mediastinal lymphadenopathy, including: --10 mm short axis low right paratracheal node (series 4/image 27), unchanged --12 mm short axis subcarinal node (series 4/image 44), previously 14 mm Visualized thyroid is unremarkable. Lungs/Pleura: Small left pleural effusion with pleural thickening/enhancement. Radiation changes in the anterior left upper lobe. Associated 1.9 x 3.7 cm subpleural patchy/spiculated opacity along the anterior left upper lobe (series 14/image 33), unchanged. Additional patchy opacities at the left lung base (series 4/image 61), unchanged, possibly  reflecting atelectasis. Right lung is essentially clear.  No pneumothorax. Musculoskeletal: Multifocal lytic osseous metastases, including T1, T2, T5, and T11. Review of the MIP images confirms the above findings. CT ABDOMEN and PELVIS FINDINGS Hepatobiliary: Multifocal hepatic metastases, unchanged, including: --17 mm lesion posterior to the right hepatic vein (series 6/image 9), unchanged --5.1 x 2.2 cm aggregate lesion along the lateral aspect of segment 8 (series 6/image 13), previously 5.3 x 2.2 cm --2.6 x 1.5 cm lesion along the lateral aspect of segment 6 (series 6/image 20), unchanged --3.1 x 3.5 cm lesion in segment 6 (series 6/image 22), previously 3.1 x 3.6 cm Gallbladder is unremarkable. No intrahepatic or extrahepatic ductal dilatation. Pancreas: Within normal limits. Spleen: Within normal limits. Adrenals/Urinary Tract: Adrenal glands are within normal limits. Status post left nephrectomy. Right kidney is notable for tiny right renal cysts. No hydronephrosis. Bladder is within  normal limits. Stomach/Bowel: Stomach is within normal limits. No evidence of bowel obstruction. Appendix is not discretely visualized. Vascular/Lymphatic: No evidence of abdominal aortic aneurysm. Atherosclerotic calcifications of the abdominal aorta and branch vessels. No suspicious abdominopelvic lymphadenopathy. Reproductive: Status post hysterectomy. Left ovary is within normal limits. No right adnexal mass. Other: No abdominopelvic ascites. Musculoskeletal: Stable lytic metastases, including a dominant lesion along the anterior aspect of the L4 vertebral body, unchanged. Review of the MIP images confirms the above findings. IMPRESSION: No evidence of pulmonary embolism. Chronic left pleural effusion with associated pleural thickening. Chronic subpleural opacities in the left hemithorax. Mediastinal lymphadenopathy, stable versus mildly improved. Hepatic metastases, unchanged. Multifocal osseous metastases, grossly unchanged. Electronically Signed   By: Julian Hy M.D.   On: 08/16/2018 14:56   Ct Abdomen Pelvis W Contrast  Result Date: 08/16/2018 CLINICAL DATA:  Acute shortness of breath, epigastric pain and chest pressure. Abdominal distension. Breast cancer with known liver metastases. EXAM: CT ANGIOGRAPHY CHEST CT ABDOMEN AND PELVIS WITH CONTRAST TECHNIQUE: Multidetector CT imaging of the chest was performed using the standard protocol during bolus administration of intravenous contrast. Multiplanar CT image reconstructions and MIPs were obtained to evaluate the vascular anatomy. Multidetector CT imaging of the abdomen and pelvis was performed using the standard protocol during bolus administration of intravenous contrast. CONTRAST:  185mL ISOVUE-370 IOPAMIDOL (ISOVUE-370) INJECTION 76% COMPARISON:  CT chest abdomen pelvis dated 06/17/2018 FINDINGS: CTA CHEST FINDINGS Cardiovascular: Satisfactory opacification the bilateral pulmonary arteries to the lobar level. No evidence of pulmonary embolism.  No evidence of thoracic aortic aneurysm or dissection. Atherosclerotic calcifications of the aortic arch. The heart is normal in size.  No pericardial effusion. Three vessel coronary atherosclerosis. Mediastinum/Nodes: Mediastinal lymphadenopathy, including: --10 mm short axis low right paratracheal node (series 4/image 27), unchanged --12 mm short axis subcarinal node (series 4/image 44), previously 14 mm Visualized thyroid is unremarkable. Lungs/Pleura: Small left pleural effusion with pleural thickening/enhancement. Radiation changes in the anterior left upper lobe. Associated 1.9 x 3.7 cm subpleural patchy/spiculated opacity along the anterior left upper lobe (series 14/image 33), unchanged. Additional patchy opacities at the left lung base (series 4/image 61), unchanged, possibly reflecting atelectasis. Right lung is essentially clear.  No pneumothorax. Musculoskeletal: Multifocal lytic osseous metastases, including T1, T2, T5, and T11. Review of the MIP images confirms the above findings. CT ABDOMEN and PELVIS FINDINGS Hepatobiliary: Multifocal hepatic metastases, unchanged, including: --17 mm lesion posterior to the right hepatic vein (series 6/image 9), unchanged --5.1 x 2.2 cm aggregate lesion along the lateral aspect of segment 8 (series 6/image 13), previously 5.3 x 2.2 cm --2.6 x 1.5 cm lesion  along the lateral aspect of segment 6 (series 6/image 20), unchanged --3.1 x 3.5 cm lesion in segment 6 (series 6/image 22), previously 3.1 x 3.6 cm Gallbladder is unremarkable. No intrahepatic or extrahepatic ductal dilatation. Pancreas: Within normal limits. Spleen: Within normal limits. Adrenals/Urinary Tract: Adrenal glands are within normal limits. Status post left nephrectomy. Right kidney is notable for tiny right renal cysts. No hydronephrosis. Bladder is within normal limits. Stomach/Bowel: Stomach is within normal limits. No evidence of bowel obstruction. Appendix is not discretely visualized.  Vascular/Lymphatic: No evidence of abdominal aortic aneurysm. Atherosclerotic calcifications of the abdominal aorta and branch vessels. No suspicious abdominopelvic lymphadenopathy. Reproductive: Status post hysterectomy. Left ovary is within normal limits. No right adnexal mass. Other: No abdominopelvic ascites. Musculoskeletal: Stable lytic metastases, including a dominant lesion along the anterior aspect of the L4 vertebral body, unchanged. Review of the MIP images confirms the above findings. IMPRESSION: No evidence of pulmonary embolism. Chronic left pleural effusion with associated pleural thickening. Chronic subpleural opacities in the left hemithorax. Mediastinal lymphadenopathy, stable versus mildly improved. Hepatic metastases, unchanged. Multifocal osseous metastases, grossly unchanged. Electronically Signed   By: Julian Hy M.D.   On: 08/16/2018 14:56    Procedures Procedures (including critical care time)  Medications Ordered in ED Medications  ondansetron (ZOFRAN) injection 4 mg (4 mg Intravenous Given 08/16/18 1436)  iopamidol (ISOVUE-370) 76 % injection (100 mLs  Contrast Given 08/16/18 1411)  furosemide (LASIX) injection 20 mg (20 mg Intravenous Given 08/16/18 1509)     Initial Impression / Assessment and Plan / ED Course  I have reviewed the triage vital signs and the nursing notes.  Pertinent labs & imaging results that were available during my care of the patient were reviewed by me and considered in my medical decision making (see chart for details).     77 year old female here with intermittent shortness of breath.  She is well-appearing here and satting 99% on room air.  Her symptoms seem to be somewhat positional and related to orthopnea which is suspect is related to fluid and likely pleural effusion with atelectasis from her cancer.  This is a known issue.  Her EKG here is nonischemic and troponins are negative x2 and I doubt ACS.  She has no overt chest pain.   Imaging is otherwise negative for acute abnormality.  She has no infectious symptoms or signs of pneumonia.  Of note, the use symptoms seem to have worsened after she was stopped on her valsartan/HCTZ, so could be a component of hypertension as well as blood pressure and fluid changes.  I have given her a dose of Lasix here and will have her call her PCP to discuss restarting her medications tomorrow.  Will hold on prescription for this to prevent worsening of kidney function and dehydration.  Otherwise, she has no evidence of PE, pneumonia, dissection, or other acute emergent pathology.  She feels better in the ED.  Will discharge home.  This was discussed with her family, who is in agreement.  Final Clinical Impressions(s) / ED Diagnoses   Final diagnoses:  Pleural effusion  Acute respiratory distress    ED Discharge Orders    None       Duffy Bruce, MD 08/16/18 1557

## 2018-08-16 NOTE — Discharge Instructions (Addendum)
As we discussed, I suspect some of your shortness of breath is due to fluid around your lung.   I've given you a dose of LASIX here to help pull off fluid. This can cause dehydration so be careful when going from sitting to standing and drink fluid if you feel dizzy.  To help keep your lung open, we will give you a spirometer. Try to use this at least 4-6 times daily.  PLEASE CALL DR. [name] TOMORROW to discuss your visit.  OF NOTE, this could be related to stopping your blood pressure medicine which DID have a diuretic in it. It's important to follow-up with your doctor because your blood pressure was HIGH today and this could be related to your symptoms, so it may help to re-start this.

## 2018-08-17 ENCOUNTER — Telehealth: Payer: Self-pay | Admitting: *Deleted

## 2018-08-17 NOTE — Telephone Encounter (Signed)
This RN spoke with pt per her call wanting to follow up per her visit to the ER yesterday.   Note pt went to the ER due to increased SOB.  CT scans were obtained and pt was given 20 mg lasix IV.  Wendy [name] states " I feel much better today but the doctor wanted me to follow up with Dr [name] since he stopped my BP medication a couple of months ago "  This note will be reviewed with MD and call returned to pt .

## 2018-08-18 ENCOUNTER — Telehealth: Payer: Self-pay | Admitting: *Deleted

## 2018-08-18 NOTE — Telephone Encounter (Signed)
This RN spoke with pt post MD review of visit to ER and follow up per need to restart bp with diuretic medication.  MD advised for pt to resume prior dose and to follow up with cardiology.  This RN spoke with pt per above- verified medication she was on previously was valsartan- hydrochlorithiazide with dose of 1/2 daily.  Pt will restart at above dose.  Per discussion and need for cardiology follow up- pt is seen by Dr Wynonia Lawman- she is unsure when her next appointment is scheduled.  Plan per end of discussion - pt will resume medication and this RN will contact Dr Thurman Coyer office to follow up on needed appointment.  This RN contacted Dr Thurman Coyer and was informed pt is not due to be seen until March 2020- per need to discuss MD recommendations this RN was transferred to the nurse line.  Per nurse line received VM- message left per above with request for appointment for appropriate follow up.  This RN's name and office number left with message.

## 2018-08-19 ENCOUNTER — Telehealth: Payer: Self-pay

## 2018-08-19 ENCOUNTER — Other Ambulatory Visit: Payer: Self-pay

## 2018-08-19 DIAGNOSIS — I2510 Atherosclerotic heart disease of native coronary artery without angina pectoris: Secondary | ICD-10-CM

## 2018-08-19 NOTE — Telephone Encounter (Signed)
Received call from Dr.Tilley's nurse regarding MD being out for a while d/t unexpected surgery. Per nurse, advised to refer pt to a [facility] provider for follow up after ED visit. Will notify pt and will get pt an appt with a cardiologist soon.

## 2018-08-19 NOTE — Telephone Encounter (Signed)
Appt set for [facility] tomorrow at 8am. Confirmed time/date with pt.

## 2018-09-01 ENCOUNTER — Encounter: Payer: Self-pay | Admitting: Cardiology

## 2018-09-01 ENCOUNTER — Ambulatory Visit (INDEPENDENT_AMBULATORY_CARE_PROVIDER_SITE_OTHER): Payer: Medicare Other | Admitting: Cardiology

## 2018-09-01 VITALS — BP 92/64 | HR 84 | Ht 68.0 in | Wt 143.2 lb

## 2018-09-01 DIAGNOSIS — I5031 Acute diastolic (congestive) heart failure: Secondary | ICD-10-CM | POA: Insufficient documentation

## 2018-09-01 DIAGNOSIS — Z170 Estrogen receptor positive status [ER+]: Secondary | ICD-10-CM | POA: Diagnosis not present

## 2018-09-01 DIAGNOSIS — I2510 Atherosclerotic heart disease of native coronary artery without angina pectoris: Secondary | ICD-10-CM | POA: Diagnosis not present

## 2018-09-01 DIAGNOSIS — R0602 Shortness of breath: Secondary | ICD-10-CM

## 2018-09-01 DIAGNOSIS — I10 Essential (primary) hypertension: Secondary | ICD-10-CM

## 2018-09-01 DIAGNOSIS — C50412 Malignant neoplasm of upper-outer quadrant of left female breast: Secondary | ICD-10-CM | POA: Diagnosis not present

## 2018-09-01 DIAGNOSIS — J90 Pleural effusion, not elsewhere classified: Secondary | ICD-10-CM | POA: Diagnosis not present

## 2018-09-01 DIAGNOSIS — Z9861 Coronary angioplasty status: Secondary | ICD-10-CM

## 2018-09-01 MED ORDER — HYDROCHLOROTHIAZIDE 12.5 MG PO CAPS: 12.5000 mg | ORAL_CAPSULE | Freq: Every day | ORAL | 3 refills | Status: DC

## 2018-09-01 NOTE — Assessment & Plan Note (Signed)
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

## 2018-09-01 NOTE — Patient Instructions (Signed)
Medication Instructions:   Stop Diovan/HCTZ  Start HCTZ 12.5 mg daily   If you need a refill on your cardiac medications before your next appointment, please call your pharmacy.   Lab work:  None ordered   Testing/Procedures:  Schedule Echo  Follow-Up: At [facility], you and your health needs are our priority.  As part of our continuing mission to provide you with exceptional heart care, we have created designated Provider Care Teams.  These Care Teams include your primary Cardiologist (physician) and Advanced Practice Providers (APPs -  Physician Assistants and Nurse Practitioners) who all work together to provide you with the care you need, when you need it.  Schedule appointment with Dr.Berry in 4 to 6 weeks

## 2018-09-01 NOTE — Assessment & Plan Note (Addendum)
PCI '05- LAD and RCA-Dr Wynonia Lawman

## 2018-09-01 NOTE — Assessment & Plan Note (Signed)
Now hypotensive

## 2018-09-01 NOTE — Assessment & Plan Note (Signed)
Pt seen in the ED 08/17/18-SOB, hypertensive-improved after Diovan HCTZ resumed.

## 2018-09-01 NOTE — Assessment & Plan Note (Signed)
Treated with left mastectomy, followed by Taxotere/Cytoxan and to have radiation therapy 2014 Recurrence 2018 with mets to both lungs- treated with chemotherapy-Dr [name]

## 2018-09-01 NOTE — Progress Notes (Signed)
09/01/2018 Wendy [name]   [birth year]  [id number]  Primary Physician [name], [facility] Cardiologist: Dr Tilley-(Dr Gwenlyn Found to see)  HPI:  Pleasant 76 y/o female with a history of CAD, s/p RCA and LAD PCI in'05, no cardiac issues since. She has breast cancer, s/p Lt mastectomy followed by chemotherapy and radiation in 2014. She had recurrent cancer with metastasis to her lungs and a Lt pleural effusion requiring thoracentesis Jan 2019.  She is followed by Dr [name]. Echo Nov 2018 showed normal LVF. She was placed on chemotherapy and developed hypotension and her Diovan HCTZ was stopped. On 08/16/18 she presented to the ED with SOB. She was hypertensive- 167/87. Her CXR showed atx and small effusion on the left. Her BNP was 105. The ED physician gave her Lasix 40 mg IV x 1 with improvement in her symptoms and she was discharged home back on Diovan HCTZ. She is in the office today for follow up. Her daughter in law who is an Therapist, sports accompanied her. The pt is less SOB c/w her ED visit. Her B/P is borderline low.    Current Outpatient Medications  Medication Sig Dispense Refill  . acetaminophen (TYLENOL) 500 MG tablet Take 1,000 mg by mouth every 6 (six) hours as needed for mild pain.    [name] aspirin EC 81 MG tablet Take 81 mg by mouth daily.    [name] atorvastatin (LIPITOR) 40 MG tablet Take 20 mg by mouth at bedtime.     Leslee Home 100 MG capsule TAKE 1 CAPSULE (100 MG TOTAL) BY MOUTH DAILY WITH BREAKFAST. TAKE FOR 21 DAYS ON, 7 DAYS OFF. TAKE WHOLE WITH FOOD. 21 capsule 3  . megestrol (MEGACE) 40 MG tablet Take 1 tablet (40 mg total) by mouth daily. 30 tablet 3  . Multiple Vitamin (MULTIVITAMIN WITH MINERALS) TABS tablet Take 1 tablet by mouth daily.    . nitroGLYCERIN (NITROSTAT) 0.4 MG SL tablet Place 1 tablet (0.4 mg total) under the tongue every 5 (five) minutes as needed for chest pain. 10 tablet 1  . ondansetron (ZOFRAN ODT) 8 MG disintegrating tablet Take 1 tablet (8  mg total) by mouth every 8 (eight) hours as needed for nausea or vomiting. 30 tablet 6  . oxyCODONE-acetaminophen (PERCOCET/ROXICET) 5-325 MG tablet Take 1 tablet by mouth every 8 (eight) hours as needed for severe pain. 30 tablet 0  . promethazine (PHENERGAN) 25 MG tablet Take 1 tablet (25 mg total) by mouth every 8 (eight) hours as needed for nausea. 30 tablet 3  . hydrochlorothiazide (MICROZIDE) 12.5 MG capsule Take 1 capsule (12.5 mg total) by mouth daily. 90 capsule 3   No current facility-administered medications for this visit.     No Known Allergies  Past Medical History:  Diagnosis Date  . Breast cancer    left breast  . CAD (coronary artery disease)   . Hyperlipidemia   . Hypertension   . Myocardial infarction ([place]) 11/22/04  . Peripheral vascular disease ([place])    atherosclerosis carotid artery-mild    Social History   Socioeconomic History  . Marital status: Divorced    Spouse name: Not on file  . Number of children: 3  . Years of education: Not on file  . Highest education level: Not on file  Occupational History    Employer: [employer]  . Financial resource strain: Not on file  . Food insecurity:    Worry: Not on file    Inability: Not  on file  . Transportation needs:    Medical: Not on file    Non-medical: Not on file  Tobacco Use  . Smoking status: Former Smoker    Packs/day: 0.30    Types: Cigarettes    Last attempt to quit: 11/09/1983    Years since quitting: 34.8  . Smokeless tobacco: Never Used  Substance and Sexual Activity  . Alcohol use: Not Currently  . Drug use: No  . Sexual activity: Not Currently  Lifestyle  . Physical activity:    Days per week: Not on file    Minutes per session: Not on file  . Stress: Not on file  Relationships  . Social connections:    Talks on phone: Not on file    Gets together: Not on file    Attends religious service: Not on file    Active member of club or organization: Not on file    Attends  meetings of clubs or organizations: Not on file    Relationship status: Not on file  . Intimate partner violence:    Fear of current or ex partner: Not on file    Emotionally abused: Not on file    Physically abused: Not on file    Forced sexual activity: Not on file  Other Topics Concern  . Not on file  Social History Narrative  . Not on file     Family History  Problem Relation Age of Onset  . Emphysema Mother   . Heart attack Father      Review of Systems: General: negative for chills, fever, night sweats or weight changes.  Cardiovascular: negative for chest pain, edema, orthopnea, palpitations, paroxysmal nocturnal dyspnea Dermatological: negative for rash Respiratory: negative for cough or wheezing Urologic: negative for hematuria Abdominal: negative for nausea, vomiting, diarrhea, bright red blood per rectum, melena, or hematemesis Neurologic: negative for visual changes, syncope, or dizziness All other systems reviewed and are otherwise negative except as noted above.    Blood pressure 92/64, pulse 84, height 5\' 8"  (1.727 m), weight 143 lb 3.2 oz (65 kg).  General appearance: alert, cooperative and no distress Neck: no carotid bruit and no JVD Lungs: decreased breath sounds and dullness to percusion Lt base Heart: regular rate and rhythm and short systolic murmur LSB Extremities: no edema Skin: pale, cool, dry Neurologic: Grossly normal  EKG 08/17/18- NSR  ASSESSMENT AND PLAN:   Acute diastolic CHF (congestive heart failure) (HCC) Pt seen in the ED 08/17/18-SOB, hypertensive-improved after Diovan HCTZ resumed.  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 I will order another echo post chemotherapy. F/U Dr Gwenlyn Found in 6 weeks.   Kerin Ransom [name] 09/01/2018 8:47 AM

## 2018-09-03 ENCOUNTER — Ambulatory Visit (HOSPITAL_COMMUNITY): Payer: Medicare Other

## 2018-09-07 ENCOUNTER — Other Ambulatory Visit: Payer: Self-pay

## 2018-09-07 ENCOUNTER — Inpatient Hospital Stay (HOSPITAL_BASED_OUTPATIENT_CLINIC_OR_DEPARTMENT_OTHER): Payer: Medicare Other | Admitting: Hematology and Oncology

## 2018-09-07 ENCOUNTER — Inpatient Hospital Stay: Payer: Medicare Other | Attending: Hematology and Oncology

## 2018-09-07 ENCOUNTER — Telehealth: Payer: Self-pay | Admitting: Hematology and Oncology

## 2018-09-07 ENCOUNTER — Inpatient Hospital Stay: Payer: Medicare Other

## 2018-09-07 DIAGNOSIS — C771 Secondary and unspecified malignant neoplasm of intrathoracic lymph nodes: Secondary | ICD-10-CM | POA: Diagnosis not present

## 2018-09-07 DIAGNOSIS — Z7982 Long term (current) use of aspirin: Secondary | ICD-10-CM | POA: Diagnosis not present

## 2018-09-07 DIAGNOSIS — Z923 Personal history of irradiation: Secondary | ICD-10-CM

## 2018-09-07 DIAGNOSIS — Z5111 Encounter for antineoplastic chemotherapy: Secondary | ICD-10-CM | POA: Insufficient documentation

## 2018-09-07 DIAGNOSIS — C50412 Malignant neoplasm of upper-outer quadrant of left female breast: Secondary | ICD-10-CM

## 2018-09-07 DIAGNOSIS — C7801 Secondary malignant neoplasm of right lung: Secondary | ICD-10-CM

## 2018-09-07 DIAGNOSIS — C7800 Secondary malignant neoplasm of unspecified lung: Secondary | ICD-10-CM

## 2018-09-07 DIAGNOSIS — C787 Secondary malignant neoplasm of liver and intrahepatic bile duct: Secondary | ICD-10-CM | POA: Insufficient documentation

## 2018-09-07 DIAGNOSIS — Z79899 Other long term (current) drug therapy: Secondary | ICD-10-CM

## 2018-09-07 DIAGNOSIS — Z170 Estrogen receptor positive status [ER+]: Secondary | ICD-10-CM

## 2018-09-07 DIAGNOSIS — C7802 Secondary malignant neoplasm of left lung: Secondary | ICD-10-CM

## 2018-09-07 DIAGNOSIS — Z9012 Acquired absence of left breast and nipple: Secondary | ICD-10-CM | POA: Insufficient documentation

## 2018-09-07 DIAGNOSIS — C7951 Secondary malignant neoplasm of bone: Secondary | ICD-10-CM | POA: Insufficient documentation

## 2018-09-07 LAB — CBC WITH DIFFERENTIAL (CANCER CENTER ONLY)
Abs Immature Granulocytes: 0 10*3/uL (ref 0.00–0.07)
Basophils Absolute: 0 10*3/uL (ref 0.0–0.1)
Basophils Relative: 2 %
Eosinophils Absolute: 0 10*3/uL (ref 0.0–0.5)
Eosinophils Relative: 1 %
HCT: 31.2 % — ABNORMAL LOW (ref 36.0–46.0)
Hemoglobin: 10.3 g/dL — ABNORMAL LOW (ref 12.0–15.0)
Immature Granulocytes: 0 %
Lymphocytes Relative: 32 %
Lymphs Abs: 0.6 10*3/uL — ABNORMAL LOW (ref 0.7–4.0)
MCH: 32.3 pg (ref 26.0–34.0)
MCHC: 33 g/dL (ref 30.0–36.0)
MCV: 97.8 fL (ref 80.0–100.0)
Monocytes Absolute: 0.2 10*3/uL (ref 0.1–1.0)
Monocytes Relative: 12 %
Neutro Abs: 1 10*3/uL — ABNORMAL LOW (ref 1.7–7.7)
Neutrophils Relative %: 53 %
Platelet Count: 166 10*3/uL (ref 150–400)
RBC: 3.19 MIL/uL — ABNORMAL LOW (ref 3.87–5.11)
RDW: 15.9 % — ABNORMAL HIGH (ref 11.5–15.5)
WBC Count: 1.9 10*3/uL — ABNORMAL LOW (ref 4.0–10.5)
nRBC: 0 % (ref 0.0–0.2)

## 2018-09-07 LAB — COMPREHENSIVE METABOLIC PANEL
ALT: 13 U/L (ref 0–44)
AST: 20 U/L (ref 15–41)
Albumin: 3.8 g/dL (ref 3.5–5.0)
Alkaline Phosphatase: 44 U/L (ref 38–126)
Anion gap: 10 (ref 5–15)
BUN: 19 mg/dL (ref 8–23)
CO2: 25 mmol/L (ref 22–32)
Calcium: 9.5 mg/dL (ref 8.9–10.3)
Chloride: 108 mmol/L (ref 98–111)
Creatinine, Ser: 0.92 mg/dL (ref 0.44–1.00)
GFR calc Af Amer: >60 mL/min (ref >60)
GFR calc non Af Amer: 59 mL/min — ABNORMAL LOW (ref >60)
Glucose, Bld: 102 mg/dL — ABNORMAL HIGH (ref 70–99)
Potassium: 3.3 mmol/L — ABNORMAL LOW (ref 3.5–5.1)
Sodium: 143 mmol/L (ref 135–145)
Total Bilirubin: 0.6 mg/dL (ref 0.3–1.2)
Total Protein: 7 g/dL (ref 6.5–8.1)

## 2018-09-07 MED ORDER — FULVESTRANT 250 MG/5ML IM SOLN
INTRAMUSCULAR | Status: AC
  Filled 2018-09-07: qty 5

## 2018-09-07 MED ORDER — DENOSUMAB 120 MG/1.7ML ~~LOC~~ SOLN
120.0000 mg | Freq: Once | SUBCUTANEOUS | Status: AC
  Administered 2018-09-07: 120 mg via SUBCUTANEOUS

## 2018-09-07 MED ORDER — FULVESTRANT 250 MG/5ML IM SOLN
500.0000 mg | Freq: Once | INTRAMUSCULAR | Status: AC
  Administered 2018-09-07: 500 mg via INTRAMUSCULAR

## 2018-09-07 MED ORDER — DENOSUMAB 120 MG/1.7ML ~~LOC~~ SOLN
SUBCUTANEOUS | Status: AC
  Filled 2018-09-07: qty 1.7

## 2018-09-07 MED FILL — IBRANCE 100 MG CAPSULE: 100 | 28 days supply | Qty: 21 | Fill #1

## 2018-09-07 NOTE — Telephone Encounter (Signed)
Gave avs and calendar ° °

## 2018-09-07 NOTE — Assessment & Plan Note (Signed)
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 She had radiation therapy and has been on antiestrogen therapy with Arimidex 06/28/2014-January 2019  Thoracentesis: Met Breast cancer ER PR Positive and Her 2 Neg PET/CT scan. 01/04/18: New malignant Pleural effusion with small bil pulm nodules,new mild bilateral mediastinal hilar portocaval lymph node metastases, new liver metastases, new diffuse bone metastases. --------------------------------------------------------------------------- Current treatment: Ibrance with Faslodexstarted 12/29/2017 Ibrance toxicities: Fatigue Shortness of breath to exertion  Weight loss:Zofran will be scheduled every 8 hours.  Patient is currently on Megace.  She has not lost any further weight.  Faslodex toxicities: Joint and muscle aches.  CT chest abdomen pelvis 08/14/2018: Hepatic metastases unchanged, bone metastases unchanged  Lab review:Patient will come back for labs in4weeks with  labs and injection She will receive monthly injections for Faslodex.

## 2018-09-07 NOTE — Patient Instructions (Signed)
Denosumab injection What is this medicine? DENOSUMAB (den oh sue mab) slows bone breakdown. Prolia is used to treat osteoporosis in women after menopause and in men. Delton See is used to treat a high calcium level due to cancer and to prevent bone fractures and other bone problems caused by multiple myeloma or cancer bone metastases. Delton See is also used to treat giant cell tumor of the bone. This medicine may be used for other purposes; ask your health care provider or pharmacist if you have questions. COMMON BRAND NAME(S): Prolia, XGEVA What should I tell my health care provider before I take this medicine? They need to know if you have any of these conditions: -dental disease -having surgery or tooth extraction -infection -kidney disease -low levels of calcium or Vitamin D in the blood -malnutrition -on hemodialysis -skin conditions or sensitivity -thyroid or parathyroid disease -an unusual reaction to denosumab, other medicines, foods, dyes, or preservatives -pregnant or trying to get pregnant -breast-feeding How should I use this medicine? This medicine is for injection under the skin. It is given by a health care professional in a hospital or clinic setting. If you are getting Prolia, a special MedGuide will be given to you by the pharmacist with each prescription and refill. Be sure to read this information carefully each time. For Prolia, talk to your pediatrician regarding the use of this medicine in children. Special care may be needed. For Delton See, talk to your pediatrician regarding the use of this medicine in children. While this drug may be prescribed for children as young as 13 years for selected conditions, precautions do apply. Overdosage: If you think you have taken too much of this medicine contact a poison control center or emergency room at once. NOTE: This medicine is only for you. Do not share this medicine with others. What if I miss a dose? It is important not to miss your  dose. Call your doctor or health care professional if you are unable to keep an appointment. What may interact with this medicine? Do not take this medicine with any of the following medications: -other medicines containing denosumab This medicine may also interact with the following medications: -medicines that lower your chance of fighting infection -steroid medicines like prednisone or cortisone This list may not describe all possible interactions. Give your health care provider a list of all the medicines, herbs, non-prescription drugs, or dietary supplements you use. Also tell them if you smoke, drink alcohol, or use illegal drugs. Some items may interact with your medicine. What should I watch for while using this medicine? Visit your doctor or health care professional for regular checks on your progress. Your doctor or health care professional may order blood tests and other tests to see how you are doing. Call your doctor or health care professional for advice if you get a fever, chills or sore throat, or other symptoms of a cold or flu. Do not treat yourself. This drug may decrease your body's ability to fight infection. Try to avoid being around people who are sick. You should make sure you get enough calcium and vitamin D while you are taking this medicine, unless your doctor tells you not to. Discuss the foods you eat and the vitamins you take with your health care professional. See your dentist regularly. Brush and floss your teeth as directed. Before you have any dental work done, tell your dentist you are receiving this medicine. Do not become pregnant while taking this medicine or for 5 months after stopping  it. Talk with your doctor or health care professional about your birth control options while taking this medicine. Women should inform their doctor if they wish to become pregnant or think they might be pregnant. There is a potential for serious side effects to an unborn child. Talk  to your health care professional or pharmacist for more information. What side effects may I notice from receiving this medicine? Side effects that you should report to your doctor or health care professional as soon as possible: -allergic reactions like skin rash, itching or hives, swelling of the face, lips, or tongue -bone pain -breathing problems -dizziness -jaw pain, especially after dental work -redness, blistering, peeling of the skin -signs and symptoms of infection like fever or chills; cough; sore throat; pain or trouble passing urine -signs of low calcium like fast heartbeat, muscle cramps or muscle pain; pain, tingling, numbness in the hands or feet; seizures -unusual bleeding or bruising -unusually weak or tired Side effects that usually do not require medical attention (report to your doctor or health care professional if they continue or are bothersome): -constipation -diarrhea -headache -joint pain -loss of appetite -muscle pain -runny nose -tiredness -upset stomach This list may not describe all possible side effects. Call your doctor for medical advice about side effects. You may report side effects to FDA at 1-800-FDA-1088. Where should I keep my medicine? This medicine is only given in a clinic, doctor's office, or other health care setting and will not be stored at home. NOTE: This sheet is a summary. It may not cover all possible information. If you have questions about this medicine, talk to your doctor, pharmacist, or health care provider.  2018 Elsevier/Gold Standard (2016-12-03 19:17:21)   Fulvestrant injection What is this medicine? FULVESTRANT (ful VES trant) blocks the effects of estrogen. It is used to treat breast cancer. This medicine may be used for other purposes; ask your health care provider or pharmacist if you have questions. COMMON BRAND NAME(S): FASLODEX What should I tell my health care provider before I take this medicine? They need to  know if you have any of these conditions: -bleeding problems -liver disease -low levels of platelets in the blood -an unusual or allergic reaction to fulvestrant, other medicines, foods, dyes, or preservatives -pregnant or trying to get pregnant -breast-feeding How should I use this medicine? This medicine is for injection into a muscle. It is usually given by a health care professional in a hospital or clinic setting. Talk to your pediatrician regarding the use of this medicine in children. Special care may be needed. Overdosage: If you think you have taken too much of this medicine contact a poison control center or emergency room at once. NOTE: This medicine is only for you. Do not share this medicine with others. What if I miss a dose? It is important not to miss your dose. Call your doctor or health care professional if you are unable to keep an appointment. What may interact with this medicine? -medicines that treat or prevent blood clots like warfarin, enoxaparin, and dalteparin This list may not describe all possible interactions. Give your health care provider a list of all the medicines, herbs, non-prescription drugs, or dietary supplements you use. Also tell them if you smoke, drink alcohol, or use illegal drugs. Some items may interact with your medicine. What should I watch for while using this medicine? Your condition will be monitored carefully while you are receiving this medicine. You will need important blood work done while you   taking this medicine. Do not become pregnant while taking this medicine or for at least 1 year after stopping it. Women of child-bearing potential will need to have a negative pregnancy test before starting this medicine. Women should inform their doctor if they wish to become pregnant or think they might be pregnant. There is a potential for serious side effects to an unborn child. Men should inform their doctors if they wish to father a child. This  medicine may lower sperm counts. Talk to your health care professional or pharmacist for more information. Do not breast-feed an infant while taking this medicine or for 1 year after the last dose. What side effects may I notice from receiving this medicine? Side effects that you should report to your doctor or health care professional as soon as possible: -allergic reactions like skin rash, itching or hives, swelling of the face, lips, or tongue -feeling faint or lightheaded, falls -pain, tingling, numbness, or weakness in the legs -signs and symptoms of infection like fever or chills; cough; flu-like symptoms; sore throat -vaginal bleeding Side effects that usually do not require medical attention (report to your doctor or health care professional if they continue or are bothersome): -aches, pains -constipation -diarrhea -headache -hot flashes -nausea, vomiting -pain at site where injected -stomach pain This list may not describe all possible side effects. Call your doctor for medical advice about side effects. You may report side effects to FDA at 1-800-FDA-1088. Where should I keep my medicine? This drug is given in a hospital or clinic and will not be stored at home. NOTE: This sheet is a summary. It may not cover all possible information. If you have questions about this medicine, talk to your doctor, pharmacist, or health care provider.  2018 Elsevier/Gold Standard (2015-06-09 11:03:55) Fulvestrant injection What is this medicine? FULVESTRANT (ful VES trant) blocks the effects of estrogen. It is used to treat breast cancer. This medicine may be used for other purposes; ask your health care provider or pharmacist if you have questions. COMMON BRAND NAME(S): FASLODEX What should I tell my health care provider before I take this medicine? They need to know if you have any of these conditions: -bleeding problems -liver disease -low levels of platelets in the blood -an unusual or  allergic reaction to fulvestrant, other medicines, foods, dyes, or preservatives -pregnant or trying to get pregnant -breast-feeding How should I use this medicine? This medicine is for injection into a muscle. It is usually given by a health care professional in a hospital or clinic setting. Talk to your pediatrician regarding the use of this medicine in children. Special care may be needed. Overdosage: If you think you have taken too much of this medicine contact a poison control center or emergency room at once. NOTE: This medicine is only for you. Do not share this medicine with others. What if I miss a dose? It is important not to miss your dose. Call your doctor or health care professional if you are unable to keep an appointment. What may interact with this medicine? -medicines that treat or prevent blood clots like warfarin, enoxaparin, and dalteparin This list may not describe all possible interactions. Give your health care provider a list of all the medicines, herbs, non-prescription drugs, or dietary supplements you use. Also tell them if you smoke, drink alcohol, or use illegal drugs. Some items may interact with your medicine. What should I watch for while using this medicine? Your condition will be monitored carefully while you are  receiving this medicine. You will need important blood work done while you are taking this medicine. Do not become pregnant while taking this medicine or for at least 1 year after stopping it. Women of child-bearing potential will need to have a negative pregnancy test before starting this medicine. Women should inform their doctor if they wish to become pregnant or think they might be pregnant. There is a potential for serious side effects to an unborn child. Men should inform their doctors if they wish to father a child. This medicine may lower sperm counts. Talk to your health care professional or pharmacist for more information. Do not breast-feed an  infant while taking this medicine or for 1 year after the last dose. What side effects may I notice from receiving this medicine? Side effects that you should report to your doctor or health care professional as soon as possible: -allergic reactions like skin rash, itching or hives, swelling of the face, lips, or tongue -feeling faint or lightheaded, falls -pain, tingling, numbness, or weakness in the legs -signs and symptoms of infection like fever or chills; cough; flu-like symptoms; sore throat -vaginal bleeding Side effects that usually do not require medical attention (report to your doctor or health care professional if they continue or are bothersome): -aches, pains -constipation -diarrhea -headache -hot flashes -nausea, vomiting -pain at site where injected -stomach pain This list may not describe all possible side effects. Call your doctor for medical advice about side effects. You may report side effects to FDA at 1-800-FDA-1088. Where should I keep my medicine? This drug is given in a hospital or clinic and will not be stored at home. NOTE: This sheet is a summary. It may not cover all possible information. If you have questions about this medicine, talk to your doctor, pharmacist, or health care provider.  2018 Elsevier/Gold Standard (2015-06-09 11:03:55)

## 2018-09-07 NOTE — Progress Notes (Signed)
Patient Care Team: [name], [place] At as PCP - General (Family Medicine)  DIAGNOSIS:  Encounter Diagnosis  Name Primary?  . Malignant neoplasm of upper-outer quadrant of left breast in female, estrogen receptor positive ([place])     SUMMARY OF ONCOLOGIC HISTORY:   Malignant neoplasm of upper-outer quadrant of left breast in female, estrogen receptor positive ([place])   07/28/2013 Mammogram    Spiculated mass at 6:00 position 3 cm, ultrasound revealed 2.6 x 2.1 x 1.5 cm second mass at 12:00 2.1 x 1.8 x 2.1 cm: 2 abnormal lymph nodes left axilla    10/29/2013 Initial Diagnosis    Breast cancer of upper-outer quadrant of left female breast: Invasive ductal carcinoma ER 100%, PR 100%, HER-2 negative, Ki-67 14%    11/12/2013 Surgery    Left mastectomy.by Dr. [name], T3N2 (5/10 LN positive) stage IIIa IDC grade 1, ER/PR 100%, TSH 14% HER-2 negative PET/CT 12/20/2013 negative for metastases    12/17/2013 - 04/02/2014 Chemotherapy    Taxotere Cytoxan every 3 weeks for 6 cycles    05/11/2014 - 06/27/2014 Radiation Therapy    Adjuvant radiation therapy to chest wall and lymph nodes    07/06/2014 -  Anti-estrogen oral therapy    Arimidex 1 mg daily    12/21/2017 Imaging    Metastatic lesions noted in the lungs bilaterally, bilateral hilar and mediastinal lymph nodes, left pleural effusion, multiple thoracic vertebral metastases, heterogeneous lesions in the liver    12/22/2017 Procedure    Pleural Effusion Thoracentesis: Metastatic Breast cancer ER PR Positive Her 2 Neg     CHIEF COMPLIANT: recent ER visit for SOB  INTERVAL HISTORY: Wendy [name] is a 77 Year old with Met Breast cancer who was in ED for SOB and it got better with Lasix. She saw cardiology who placed her on HCTZ and shes been doing well. She has an ECHO planned for later this month. She has lost 2 lbs probably due to lasix. Breathing is much better today.  REVIEW OF SYSTEMS:   Constitutional: Denies  fevers, chills or abnormal weight loss Eyes: Denies blurriness of vision Ears, nose, mouth, throat, and face: Denies mucositis or sore throat Respiratory: Denies cough, dyspnea or wheezes Cardiovascular: Denies palpitation, chest discomfort Gastrointestinal:  Denies nausea, heartburn or change in bowel habits Skin: Denies abnormal skin rashes Lymphatics: Denies new lymphadenopathy or easy bruising Neurological:Denies numbness, tingling or new weaknesses Behavioral/Psych: Mood is stable, no new changes  Extremities: No lower extremity edema  All other systems were reviewed with the patient and are negative.  I have reviewed the past medical history, past surgical history, social history and family history with the patient and they are unchanged from previous note.  ALLERGIES:  has No Known Allergies.  MEDICATIONS:  Current Outpatient Medications  Medication Sig Dispense Refill  . acetaminophen (TYLENOL) 500 MG tablet Take 1,000 mg by mouth every 6 (six) hours as needed for mild pain.    [name] aspirin EC 81 MG tablet Take 81 mg by mouth daily.    [name] atorvastatin (LIPITOR) 40 MG tablet Take 20 mg by mouth at bedtime.     . hydrochlorothiazide (MICROZIDE) 12.5 MG capsule Take 1 capsule (12.5 mg total) by mouth daily. 90 capsule 3  . IBRANCE 100 MG capsule TAKE 1 CAPSULE (100 MG TOTAL) BY MOUTH DAILY WITH BREAKFAST. TAKE FOR 21 DAYS ON, 7 DAYS OFF. TAKE WHOLE WITH FOOD. 21 capsule 3  . megestrol (MEGACE) 40 MG tablet Take 1 tablet (40 mg  total) by mouth daily. 30 tablet 3  . Multiple Vitamin (MULTIVITAMIN WITH MINERALS) TABS tablet Take 1 tablet by mouth daily.    . nitroGLYCERIN (NITROSTAT) 0.4 MG SL tablet Place 1 tablet (0.4 mg total) under the tongue every 5 (five) minutes as needed for chest pain. 10 tablet 1  . ondansetron (ZOFRAN ODT) 8 MG disintegrating tablet Take 1 tablet (8 mg total) by mouth every 8 (eight) hours as needed for nausea or vomiting. 30 tablet 6  . oxyCODONE-acetaminophen  (PERCOCET/ROXICET) 5-325 MG tablet Take 1 tablet by mouth every 8 (eight) hours as needed for severe pain. 30 tablet 0  . promethazine (PHENERGAN) 25 MG tablet Take 1 tablet (25 mg total) by mouth every 8 (eight) hours as needed for nausea. 30 tablet 3   No current facility-administered medications for this visit.     PHYSICAL EXAMINATION: ECOG PERFORMANCE STATUS: 1 - Symptomatic but completely ambulatory  Vitals:   09/07/18 0936  BP: 133/63  Pulse: 90  Resp: 18  Temp: 97.9 F (36.6 C)  SpO2: 100%   Filed Weights   09/07/18 0936  Weight: 144 lb 9.6 oz (65.6 kg)    GENERAL:alert, no distress and comfortable SKIN: skin color, texture, turgor are normal, no rashes or significant lesions EYES: normal, Conjunctiva are pink and non-injected, sclera clear OROPHARYNX:no exudate, no erythema and lips, buccal mucosa, and tongue normal  NECK: supple, thyroid normal size, non-tender, without nodularity LYMPH:  no palpable lymphadenopathy in the cervical, axillary or inguinal LUNGS: clear to auscultation and percussion with normal breathing effort HEART: regular rate & rhythm and no murmurs and no lower extremity edema ABDOMEN:abdomen soft, non-tender and normal bowel sounds MUSCULOSKELETAL:no cyanosis of digits and no clubbing  NEURO: alert & oriented x 3 with fluent speech, no focal motor/sensory deficits EXTREMITIES: No lower extremity edema   LABORATORY DATA:  I have reviewed the data as listed CMP Latest Ref Rng & Units 09/07/2018 08/16/2018 08/10/2018  Glucose 70 - 99 mg/dL 102(H) 96 87  BUN 8 - 23 mg/dL _0 Creatinine 0.44 - 1.00 mg/dL 0.92 0.84 0.81  Sodium 135 - 145 mmol/L 143 140 142  Potassium 3.5 - 5.1 mmol/L 3.3(L) 4.2 4.0  Chloride 98 - 111 mmol/L 108 106 109  CO2 22 - 32 mmol/L _1 Calcium 8.9 - 10.3 mg/dL 9.5 9.8 9.3  Total Protein 6.5 - 8.1 g/dL 7.0 7.0 6.6  Total Bilirubin 0.3 - 1.2 mg/dL 0.6 0.5 0.3  Alkaline Phos 38 - 126 U/L 44 61 62  AST 15 - 41  U/L _2 ALT 0 - 44 U/L _3 Lab Results  Component Value Date   WBC 1.9 (L) 09/07/2018   HGB 10.3 (L) 09/07/2018   HCT 31.2 (L) 09/07/2018   MCV 97.8 09/07/2018   PLT 166 09/07/2018   NEUTROABS 1.0 (L) 09/07/2018    ASSESSMENT & PLAN:  Malignant neoplasm of upper-outer quadrant of left breast in female, 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 She had radiation therapy and has been on antiestrogen therapy with Arimidex 06/28/2014-January 2019  Thoracentesis: Met Breast cancer ER PR Positive and Her 2 Neg PET/CT scan. 01/04/18: New malignant Pleural effusion with small bil pulm nodules,new mild bilateral mediastinal hilar portocaval lymph node metastases, new liver  metastases, new diffuse bone metastases. --------------------------------------------------------------------------- Current treatment: Ibrance with Faslodexstarted 12/29/2017 Ibrance toxicities: Fatigue Shortness of breath to exertion: Possible due to CHF  Weight loss:Zofran will be scheduled every 8 hours.  Patient is currently on Megace.  She has not lost any further weight.  Faslodex toxicities: Joint and muscle aches.  CT chest abdomen pelvis 08/14/2018: Hepatic metastases unchanged, bone metastases unchanged  Lab review:Patient will come back for labs in4weeks with  labs and injection, ANC 1000 She will receive monthly injections for Faslodex.  No orders of the defined types were placed in this encounter.  The patient has a good understanding of the overall plan. she agrees with it. she will call with any problems that may develop before the next visit here.   Harriette Ohara, MD 09/07/18

## 2018-09-07 NOTE — Progress Notes (Signed)
May give Xgeva  Today without results of CMP today. Verbal order Dr. [name].

## 2018-09-10 ENCOUNTER — Other Ambulatory Visit: Payer: Self-pay

## 2018-09-10 ENCOUNTER — Ambulatory Visit (HOSPITAL_COMMUNITY): Payer: Medicare Other | Attending: Cardiovascular Disease

## 2018-09-10 DIAGNOSIS — R0602 Shortness of breath: Secondary | ICD-10-CM

## 2018-09-15 DIAGNOSIS — Z23 Encounter for immunization: Secondary | ICD-10-CM | POA: Diagnosis not present

## 2018-09-27 ENCOUNTER — Other Ambulatory Visit: Payer: Self-pay | Admitting: Hematology and Oncology

## 2018-10-05 ENCOUNTER — Ambulatory Visit: Payer: Medicare Other

## 2018-10-05 ENCOUNTER — Other Ambulatory Visit: Payer: Medicare Other

## 2018-10-06 ENCOUNTER — Other Ambulatory Visit: Payer: Self-pay

## 2018-10-06 DIAGNOSIS — C7951 Secondary malignant neoplasm of bone: Secondary | ICD-10-CM

## 2018-10-07 ENCOUNTER — Inpatient Hospital Stay: Payer: Medicare Other | Attending: Hematology and Oncology

## 2018-10-07 ENCOUNTER — Telehealth: Payer: Self-pay | Admitting: Hematology and Oncology

## 2018-10-07 ENCOUNTER — Inpatient Hospital Stay: Payer: Medicare Other

## 2018-10-07 ENCOUNTER — Inpatient Hospital Stay (HOSPITAL_BASED_OUTPATIENT_CLINIC_OR_DEPARTMENT_OTHER): Payer: Medicare Other | Admitting: Hematology and Oncology

## 2018-10-07 VITALS — BP 139/67 | HR 84 | Temp 97.9°F | Resp 17 | Ht 68.0 in | Wt 143.3 lb

## 2018-10-07 DIAGNOSIS — C7801 Secondary malignant neoplasm of right lung: Secondary | ICD-10-CM | POA: Diagnosis not present

## 2018-10-07 DIAGNOSIS — C7951 Secondary malignant neoplasm of bone: Secondary | ICD-10-CM

## 2018-10-07 DIAGNOSIS — Z9012 Acquired absence of left breast and nipple: Secondary | ICD-10-CM

## 2018-10-07 DIAGNOSIS — C7800 Secondary malignant neoplasm of unspecified lung: Secondary | ICD-10-CM

## 2018-10-07 DIAGNOSIS — J910 Malignant pleural effusion: Secondary | ICD-10-CM | POA: Insufficient documentation

## 2018-10-07 DIAGNOSIS — Z923 Personal history of irradiation: Secondary | ICD-10-CM

## 2018-10-07 DIAGNOSIS — Z170 Estrogen receptor positive status [ER+]: Secondary | ICD-10-CM | POA: Diagnosis not present

## 2018-10-07 DIAGNOSIS — C787 Secondary malignant neoplasm of liver and intrahepatic bile duct: Secondary | ICD-10-CM | POA: Diagnosis not present

## 2018-10-07 DIAGNOSIS — Z5111 Encounter for antineoplastic chemotherapy: Secondary | ICD-10-CM | POA: Insufficient documentation

## 2018-10-07 DIAGNOSIS — Z79899 Other long term (current) drug therapy: Secondary | ICD-10-CM | POA: Insufficient documentation

## 2018-10-07 DIAGNOSIS — Z7982 Long term (current) use of aspirin: Secondary | ICD-10-CM | POA: Diagnosis not present

## 2018-10-07 DIAGNOSIS — Z9861 Coronary angioplasty status: Secondary | ICD-10-CM

## 2018-10-07 DIAGNOSIS — Z79811 Long term (current) use of aromatase inhibitors: Secondary | ICD-10-CM

## 2018-10-07 DIAGNOSIS — C50412 Malignant neoplasm of upper-outer quadrant of left female breast: Secondary | ICD-10-CM

## 2018-10-07 DIAGNOSIS — C7802 Secondary malignant neoplasm of left lung: Secondary | ICD-10-CM | POA: Diagnosis not present

## 2018-10-07 DIAGNOSIS — I2510 Atherosclerotic heart disease of native coronary artery without angina pectoris: Secondary | ICD-10-CM

## 2018-10-07 LAB — CBC WITH DIFFERENTIAL (CANCER CENTER ONLY)
Abs Immature Granulocytes: 0.01 10*3/uL (ref 0.00–0.07)
Basophils Absolute: 0 10*3/uL (ref 0.0–0.1)
Basophils Relative: 1 %
Eosinophils Absolute: 0 10*3/uL (ref 0.0–0.5)
Eosinophils Relative: 1 %
HCT: 30.2 % — ABNORMAL LOW (ref 36.0–46.0)
Hemoglobin: 9.8 g/dL — ABNORMAL LOW (ref 12.0–15.0)
Immature Granulocytes: 0 %
Lymphocytes Relative: 27 %
Lymphs Abs: 0.8 10*3/uL (ref 0.7–4.0)
MCH: 32.8 pg (ref 26.0–34.0)
MCHC: 32.5 g/dL (ref 30.0–36.0)
MCV: 101 fL — ABNORMAL HIGH (ref 80.0–100.0)
Monocytes Absolute: 0.6 10*3/uL (ref 0.1–1.0)
Monocytes Relative: 20 %
Neutro Abs: 1.5 10*3/uL — ABNORMAL LOW (ref 1.7–7.7)
Neutrophils Relative %: 51 %
Platelet Count: 192 10*3/uL (ref 150–400)
RBC: 2.99 MIL/uL — ABNORMAL LOW (ref 3.87–5.11)
RDW: 17 % — ABNORMAL HIGH (ref 11.5–15.5)
WBC Count: 3 10*3/uL — ABNORMAL LOW (ref 4.0–10.5)
nRBC: 0 % (ref 0.0–0.2)

## 2018-10-07 LAB — CMP (CANCER CENTER ONLY)
ALT: 11 U/L (ref 0–44)
AST: 20 U/L (ref 15–41)
Albumin: 3.4 g/dL — ABNORMAL LOW (ref 3.5–5.0)
Alkaline Phosphatase: 63 U/L (ref 38–126)
Anion gap: 9 (ref 5–15)
BUN: 13 mg/dL (ref 8–23)
CO2: 25 mmol/L (ref 22–32)
Calcium: 8.9 mg/dL (ref 8.9–10.3)
Chloride: 107 mmol/L (ref 98–111)
Creatinine: 0.89 mg/dL (ref 0.44–1.00)
GFR, Est AFR Am: >60 mL/min (ref >60)
GFR, Estimated: >60 mL/min (ref >60)
Glucose, Bld: 90 mg/dL (ref 70–99)
Potassium: 3.4 mmol/L — ABNORMAL LOW (ref 3.5–5.1)
Sodium: 141 mmol/L (ref 135–145)
Total Bilirubin: 0.3 mg/dL (ref 0.3–1.2)
Total Protein: 6.6 g/dL (ref 6.5–8.1)

## 2018-10-07 MED ORDER — DENOSUMAB 120 MG/1.7ML ~~LOC~~ SOLN
SUBCUTANEOUS | Status: AC
  Filled 2018-10-07: qty 1.7

## 2018-10-07 MED ORDER — DENOSUMAB 120 MG/1.7ML ~~LOC~~ SOLN: 120.0000 mg | Freq: Once | SUBCUTANEOUS | Status: DC

## 2018-10-07 MED ORDER — FULVESTRANT 250 MG/5ML IM SOLN
500.0000 mg | Freq: Once | INTRAMUSCULAR | Status: AC
  Administered 2018-10-07: 500 mg via INTRAMUSCULAR

## 2018-10-07 MED ORDER — FULVESTRANT 250 MG/5ML IM SOLN
INTRAMUSCULAR | Status: AC
  Filled 2018-10-07: qty 10

## 2018-10-07 MED FILL — MEGESTROL 40 MG TABLET: 40 | 30 days supply | Qty: 30 | Fill #2

## 2018-10-07 MED FILL — IBRANCE 100 MG CAPSULE: 100 | 28 days supply | Qty: 21 | Fill #2

## 2018-10-07 NOTE — Telephone Encounter (Signed)
Printed calendar and avs. °

## 2018-10-07 NOTE — Assessment & Plan Note (Signed)
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 She had radiation therapy and has been on antiestrogen therapy with Arimidex 06/28/2014-January 2019  Thoracentesis: Met Breast cancer ER PR Positive and Her 2 Neg PET/CT scan. 01/04/18: New malignant Pleural effusion with small bil pulm nodules,new mild bilateral mediastinal hilar portocaval lymph node metastases, new liver metastases, new diffuse bone metastases. --------------------------------------------------------------------------- Current treatment: Ibrance with Faslodexstarted 12/29/2017 Ibrance toxicities: Fatigue Shortness of breath to exertion: Possible due to CHF  Weight loss:Zofran will be scheduled every 8 hours.Patient is currently on Megace. She has not lost any further weight.  Faslodex toxicities: Joint and muscle aches.  CT chest abdomen pelvis 08/14/2018: Hepatic metastases unchanged, bone metastases unchanged  Lab review:Patient will come back for labs in4weekswith  labs and injection, ANC 1000 She will receive monthly injections for Faslodex. We changed Xgeva to every 3 months  Return to clinic in 1 month with CT chest abdomen pelvis labs and follow-up.

## 2018-10-07 NOTE — Progress Notes (Signed)
Patient Care Team: [name], [place] At as PCP - General (Family Medicine)  DIAGNOSIS:  Encounter Diagnoses  Name Primary?  . Bone metastases ([place]) Yes  . Malignant neoplasm metastatic to lung, unspecified laterality ([place])   . Malignant neoplasm of upper-outer quadrant of left breast in female, estrogen receptor positive ([place])     SUMMARY OF ONCOLOGIC HISTORY:   Malignant neoplasm of upper-outer quadrant of left breast in female, estrogen receptor positive ([place])   07/28/2013 Mammogram    Spiculated mass at 6:00 position 3 cm, ultrasound revealed 2.6 x 2.1 x 1.5 cm second mass at 12:00 2.1 x 1.8 x 2.1 cm: 2 abnormal lymph nodes left axilla    10/29/2013 Initial Diagnosis    Breast cancer of upper-outer quadrant of left female breast: Invasive ductal carcinoma ER 100%, PR 100%, HER-2 negative, Ki-67 14%    11/12/2013 Surgery    Left mastectomy.by Dr. [name], T3N2 (5/10 LN positive) stage IIIa IDC grade 1, ER/PR 100%, TSH 14% HER-2 negative PET/CT 12/20/2013 negative for metastases    12/17/2013 - 04/02/2014 Chemotherapy    Taxotere Cytoxan every 3 weeks for 6 cycles    05/11/2014 - 06/27/2014 Radiation Therapy    Adjuvant radiation therapy to chest wall and lymph nodes    07/06/2014 -  Anti-estrogen oral therapy    Arimidex 1 mg daily    12/21/2017 Imaging    Metastatic lesions noted in the lungs bilaterally, bilateral hilar and mediastinal lymph nodes, left pleural effusion, multiple thoracic vertebral metastases, heterogeneous lesions in the liver    12/22/2017 Procedure    Pleural Effusion Thoracentesis: Metastatic Breast cancer ER PR Positive Her 2 Neg     CHIEF COMPLIANT: Follow-up of metastatic breast cancer on Ibrance with Faslodex  INTERVAL HISTORY: Wendy [name] is a 77 year old with above-mentioned metastatic breast cancer who is here to review her blood work.  She has been tolerating the treatment fairly well.  She does not have any pain.  She  does not take any pain medications.  She has shortness of breath to exertion but that has been stable.  REVIEW OF SYSTEMS:   Constitutional: Denies fevers, chills or abnormal weight loss Eyes: Denies blurriness of vision Ears, nose, mouth, throat, and face: Denies mucositis or sore throat Respiratory: Shortness of breath to exertion Cardiovascular: Denies palpitation, chest discomfort Gastrointestinal:  Denies nausea, heartburn or change in bowel habits Skin: Denies abnormal skin rashes Lymphatics: Denies new lymphadenopathy or easy bruising Neurological:Denies numbness, tingling or new weaknesses Behavioral/Psych: Mood is stable, no new changes  Extremities: No lower extremity edema   All other systems were reviewed with the patient and are negative.  I have reviewed the past medical history, past surgical history, social history and family history with the patient and they are unchanged from previous note.  ALLERGIES:  has No Known Allergies.  MEDICATIONS:  Current Outpatient Medications  Medication Sig Dispense Refill  . acetaminophen (TYLENOL) 500 MG tablet Take 1,000 mg by mouth every 6 (six) hours as needed for mild pain.    [name] aspirin EC 81 MG tablet Take 81 mg by mouth daily.    [name] atorvastatin (LIPITOR) 40 MG tablet Take 20 mg by mouth at bedtime.     . hydrochlorothiazide (MICROZIDE) 12.5 MG capsule Take 1 capsule (12.5 mg total) by mouth daily. 90 capsule 3  . IBRANCE 100 MG capsule TAKE 1 CAPSULE (100 MG TOTAL) BY MOUTH DAILY WITH BREAKFAST. TAKE FOR 21 DAYS ON, 7 DAYS OFF. TAKE  WHOLE WITH FOOD. 21 capsule 3  . megestrol (MEGACE) 40 MG tablet Take 1 tablet (40 mg total) by mouth daily. 30 tablet 3  . Multiple Vitamin (MULTIVITAMIN WITH MINERALS) TABS tablet Take 1 tablet by mouth daily.    . nitroGLYCERIN (NITROSTAT) 0.4 MG SL tablet PLACE 1 TABLET (0.4 MG TOTAL) UNDER THE TONGUE EVERY 5 (FIVE) MINUTES AS NEEDED FOR CHEST PAIN. 25 tablet 0  . ondansetron (ZOFRAN ODT) 8 MG  disintegrating tablet Take 1 tablet (8 mg total) by mouth every 8 (eight) hours as needed for nausea or vomiting. 30 tablet 6  . oxyCODONE-acetaminophen (PERCOCET/ROXICET) 5-325 MG tablet Take 1 tablet by mouth every 8 (eight) hours as needed for severe pain. 30 tablet 0  . promethazine (PHENERGAN) 25 MG tablet Take 1 tablet (25 mg total) by mouth every 8 (eight) hours as needed for nausea. 30 tablet 3   No current facility-administered medications for this visit.     PHYSICAL EXAMINATION: ECOG PERFORMANCE STATUS: 1 - Symptomatic but completely ambulatory  Vitals:   10/07/18 1358  BP: 139/67  Pulse: 84  Resp: 17  Temp: 97.9 F (36.6 C)  SpO2: 100%   Filed Weights   10/07/18 1358  Weight: 143 lb 4.8 oz (65 kg)    GENERAL:alert, no distress and comfortable SKIN: skin color, texture, turgor are normal, no rashes or significant lesions EYES: normal, Conjunctiva are pink and non-injected, sclera clear OROPHARYNX:no exudate, no erythema and lips, buccal mucosa, and tongue normal  NECK: supple, thyroid normal size, non-tender, without nodularity LYMPH:  no palpable lymphadenopathy in the cervical, axillary or inguinal LUNGS: clear to auscultation and percussion with normal breathing effort HEART: regular rate & rhythm and no murmurs and no lower extremity edema ABDOMEN:abdomen soft, non-tender and normal bowel sounds MUSCULOSKELETAL:no cyanosis of digits and no clubbing  NEURO: alert & oriented x 3 with fluent speech, no focal motor/sensory deficits EXTREMITIES: No lower extremity edema   LABORATORY DATA:  I have reviewed the data as listed CMP Latest Ref Rng & Units 10/07/2018 09/07/2018 08/16/2018  Glucose 70 - 99 mg/dL 90 102(H) 96  BUN 8 - 23 mg/dL 13 19 14   Creatinine 0.44 - 1.00 mg/dL 0.89 0.92 0.84  Sodium 135 - 145 mmol/L 141 143 140  Potassium 3.5 - 5.1 mmol/L 3.4(L) 3.3(L) 4.2  Chloride 98 - 111 mmol/L 107 108 106  CO2 22 - 32 mmol/L 25 25 23   Calcium 8.9 - 10.3 mg/dL  8.9 9.5 9.8  Total Protein 6.5 - 8.1 g/dL 6.6 7.0 7.0  Total Bilirubin 0.3 - 1.2 mg/dL 0.3 0.6 0.5  Alkaline Phos 38 - 126 U/L 63 44 61  AST 15 - 41 U/L 20 20 21   ALT 0 - 44 U/L 11 13 18     Lab Results  Component Value Date   WBC 3.0 (L) 10/07/2018   HGB 9.8 (L) 10/07/2018   HCT 30.2 (L) 10/07/2018   MCV 101.0 (H) 10/07/2018   PLT 192 10/07/2018   NEUTROABS 1.5 (L) 10/07/2018    ASSESSMENT & PLAN:  Malignant neoplasm of upper-outer quadrant of left breast in female, 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 She had radiation therapy and has been on antiestrogen therapy with Arimidex 06/28/2014-January 2019  Thoracentesis: Met Breast cancer ER PR Positive and Her 2 Neg PET/CT scan. 01/04/18:  New malignant Pleural effusion with small bil pulm nodules,new mild bilateral mediastinal hilar portocaval lymph node metastases, new liver metastases, new diffuse bone metastases. --------------------------------------------------------------------------- Current treatment: Ibrance with Faslodexstarted 12/29/2017 Ibrance toxicities: Fatigue Shortness of breath to exertion: Possible due to CHF  Weight loss:Zofran will be scheduled every 8 hours.Patient is currently on Megace. She has not lost any further weight.  Faslodex toxicities: Joint and muscle aches.  CT chest abdomen pelvis 08/14/2018: Hepatic metastases unchanged, bone metastases unchanged  Lab review:Patient will come back for labs in4weekswith  labs and injection, ANC 1000 She will receive monthly injections for Faslodex. We changed Xgeva to every 3 months  Return to clinic in 1 month with CT chest abdomen pelvis labs and follow-up.    Orders Placed This Encounter  Procedures  . CT Abdomen Pelvis W Contrast    Standing Status:    Future    Standing Expiration Date:   10/07/2019    Order Specific Question:   ** REASON FOR EXAM (FREE TEXT)    Answer:   Met breast cancer restaging    Order Specific Question:   If indicated for the ordered procedure, I authorize the administration of contrast media per Radiology protocol    Answer:   Yes    Order Specific Question:   Preferred imaging location?    Answer:   [facility]    Order Specific Question:   Is Oral Contrast requested for this exam?    Answer:   Yes, Per Radiology protocol    Order Specific Question:   Radiology Contrast Protocol - do NOT remove file path    Answer:   [email or web address]  . CT Chest W Contrast    Standing Status:   Future    Standing Expiration Date:   10/07/2019    Order Specific Question:   ** REASON FOR EXAM (FREE TEXT)    Answer:   Met breast cancer restaging    Order Specific Question:   If indicated for the ordered procedure, I authorize the administration of contrast media per Radiology protocol    Answer:   Yes    Order Specific Question:   Preferred imaging location?    Answer:   [facility]    Order Specific Question:   Radiology Contrast Protocol - do NOT remove file path    Answer:   [email or web address]   The patient has a good understanding of the overall plan. she agrees with it. she will call with any problems that may develop before the next visit here.   Harriette Ohara, MD 10/07/18

## 2018-10-09 DIAGNOSIS — H25813 Combined forms of age-related cataract, bilateral: Secondary | ICD-10-CM | POA: Diagnosis not present

## 2018-10-09 DIAGNOSIS — H40013 Open angle with borderline findings, low risk, bilateral: Secondary | ICD-10-CM | POA: Diagnosis not present

## 2018-10-09 DIAGNOSIS — H43812 Vitreous degeneration, left eye: Secondary | ICD-10-CM | POA: Diagnosis not present

## 2018-10-14 ENCOUNTER — Ambulatory Visit (INDEPENDENT_AMBULATORY_CARE_PROVIDER_SITE_OTHER): Payer: Medicare Other | Admitting: Cardiovascular Disease

## 2018-10-14 ENCOUNTER — Encounter: Payer: Self-pay | Admitting: Cardiovascular Disease

## 2018-10-14 VITALS — BP 128/68 | HR 96 | Ht 68.0 in | Wt 142.6 lb

## 2018-10-14 DIAGNOSIS — I2510 Atherosclerotic heart disease of native coronary artery without angina pectoris: Secondary | ICD-10-CM

## 2018-10-14 DIAGNOSIS — Z9861 Coronary angioplasty status: Secondary | ICD-10-CM | POA: Diagnosis not present

## 2018-10-14 DIAGNOSIS — R0989 Other specified symptoms and signs involving the circulatory and respiratory systems: Secondary | ICD-10-CM | POA: Diagnosis not present

## 2018-10-14 NOTE — Patient Instructions (Addendum)
Medication Instructions:  Your physician recommends that you continue on your current medications as directed. Please refer to the Current Medication list given to you today.  If you need a refill on your cardiac medications before your next appointment, please call your pharmacy.   Lab work: none If you have labs (blood work) drawn today and your tests are completely normal, you will receive your results only by: [name] MyChart Message (if you have MyChart) OR . A paper copy in the mail If you have any lab test that is abnormal or we need to change your treatment, we will call you to review the results.  Testing/Procedures: Your physician has requested that you have a carotid duplex. This test is an ultrasound of the carotid arteries in your neck. It looks at blood flow through these arteries that supply the brain with blood. Allow one hour for this exam. There are no restrictions or special instructions.  Follow-Up: At [facility], you and your health needs are our priority.  As part of our continuing mission to provide you with exceptional heart care, we have created designated Provider Care Teams.  These Care Teams include your primary Cardiologist (physician) and Advanced Practice Providers (APPs -  Physician Assistants and Nurse Practitioners) who all work together to provide you with the care you need, when you need it. You will need a follow up appointment in 12 months.  Please call our office 2 months in advance to schedule this appointment.  You may see Dr. Gwenlyn Found or one of the following Advanced Practice Providers on your designated Care Team:   Kerin Ransom, [name] Roby Lofts, [place] . Sande Rives, [name]  Any Other Special Instructions Will Be Listed Below (If Applicable).

## 2018-10-14 NOTE — Assessment & Plan Note (Signed)
History of hyperlipidemia on statin therapy with lipid profile performed 09/23/2017 revealing total cholesterol 88, LDL of 42 and HDL of 35.

## 2018-10-14 NOTE — Assessment & Plan Note (Signed)
History of CAD status post LAD and RCA intervention by Dr. Wynonia Lawman in 2005.  She denies chest pain or shortness of breath.

## 2018-10-14 NOTE — Assessment & Plan Note (Signed)
History of carotid artery disease with a right carotid bruit and Dopplers performed 01/30/2016 revealing a right external carotid artery stenosis.  I am going to repeat carotid Doppler studies.

## 2018-10-14 NOTE — Progress Notes (Signed)
10/14/2018 [name]   01-28-1941  [id number]  Primary Physician [facility] Cardiologist: Lorretta Harp MD [name], [place]  HPI:  Wendy [name] is a 77 y.o. thin appearing divorced Caucasian female mother 15, grandmother of 6 grandchildren previous patient of Dr. Tollie Eth who I am seeing as a new patient in our practice.  Primary care providers are [name].  Risk factors include treated hyperlipidemia.  She does have a history of ischemic heart disease status post stenting by Dr. Wynonia Lawman in 2005.  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  She has had breast cancer with mastectomy, chemotherapy and radiation therapy with lung metastases and pleural effusion.  She did have a 2D echo performed 09/10/2018 which was entirely normal with grade 1 diastolic dysfunction.   Current Meds  Medication Sig  . acetaminophen (TYLENOL) 500 MG tablet Take 1,000 mg by mouth every 6 (six) hours as needed for mild pain.  [name] aspirin EC 81 MG tablet Take 81 mg by mouth daily.  [name] atorvastatin (LIPITOR) 40 MG tablet Take 20 mg by mouth at bedtime.   . hydrochlorothiazide (MICROZIDE) 12.5 MG capsule Take 1 capsule (12.5 mg total) by mouth daily.  Leslee Home 100 MG capsule TAKE 1 CAPSULE (100 MG TOTAL) BY MOUTH DAILY WITH BREAKFAST. TAKE FOR 21 DAYS ON, 7 DAYS OFF. TAKE WHOLE WITH FOOD.  . megestrol (MEGACE) 40 MG tablet Take 1 tablet (40 mg total) by mouth daily.  . Multiple Vitamin (MULTIVITAMIN WITH MINERALS) TABS tablet Take 1 tablet by mouth daily.  . nitroGLYCERIN (NITROSTAT) 0.4 MG SL tablet PLACE 1 TABLET (0.4 MG TOTAL) UNDER THE TONGUE EVERY 5 (FIVE) MINUTES AS NEEDED FOR CHEST PAIN.  [name] ondansetron (ZOFRAN ODT) 8 MG disintegrating tablet Take 1 tablet (8 mg total) by mouth every 8 (eight) hours as needed for nausea or vomiting.  [name] oxyCODONE-acetaminophen (PERCOCET/ROXICET) 5-325 MG tablet Take 1 tablet by  mouth every 8 (eight) hours as needed for severe pain.  . promethazine (PHENERGAN) 25 MG tablet Take 1 tablet (25 mg total) by mouth every 8 (eight) hours as needed for nausea.     No Known Allergies  Social History   Socioeconomic History  . Marital status: Divorced    Spouse name: Not on file  . Number of children: 3  . Years of education: Not on file  . Highest education level: Not on file  Occupational History    Employer: [employer]  . Financial resource strain: Not on file  . Food insecurity:    Worry: Not on file    Inability: Not on file  . Transportation needs:    Medical: Not on file    Non-medical: Not on file  Tobacco Use  . Smoking status: Former Smoker    Packs/day: 0.30    Types: Cigarettes    Last attempt to quit: 11/09/1983    Years since quitting: 34.9  . Smokeless tobacco: Never Used  Substance and Sexual Activity  . Alcohol use: Not Currently  . Drug use: No  . Sexual activity: Not Currently  Lifestyle  . Physical activity:    Days per week: Not on file    Minutes per session: Not on file  . Stress: Not on file  Relationships  . Social connections:    Talks on phone: Not on file    Gets together: Not on file  Attends religious service: Not on file    Active member of club or organization: Not on file    Attends meetings of clubs or organizations: Not on file    Relationship status: Not on file  . Intimate partner violence:    Fear of current or ex partner: Not on file    Emotionally abused: Not on file    Physically abused: Not on file    Forced sexual activity: Not on file  Other Topics Concern  . Not on file  Social History Narrative  . Not on file     Review of Systems: General: negative for chills, fever, night sweats or weight changes.  Cardiovascular: negative for chest pain, dyspnea on exertion, edema, orthopnea, palpitations, paroxysmal nocturnal dyspnea or shortness of breath Dermatological: negative for  rash Respiratory: negative for cough or wheezing Urologic: negative for hematuria Abdominal: negative for nausea, vomiting, diarrhea, bright red blood per rectum, melena, or hematemesis Neurologic: negative for visual changes, syncope, or dizziness All other systems reviewed and are otherwise negative except as noted above.    Blood pressure 128/68, pulse 96, height 5\' 8"  (1.727 m), weight 142 lb 9.6 oz (64.7 kg).  General appearance: alert and no distress Neck: no adenopathy, no JVD, supple, symmetrical, trachea midline, thyroid not enlarged, symmetric, no tenderness/mass/nodules and Soft right carotid bruit Lungs: clear to auscultation bilaterally Heart: regular rate and rhythm, S1, S2 normal, no murmur, click, rub or gallop Extremities: extremities normal, atraumatic, no cyanosis or edema Pulses: 2+ and symmetric Skin: Skin color, texture, turgor normal. No rashes or lesions Neurologic: Alert and oriented X 3, normal strength and tone. Normal symmetric reflexes. Normal coordination and gait  EKG not performed today  ASSESSMENT AND PLAN:   Hyperlipidemia History of hyperlipidemia on statin therapy with lipid profile performed 09/23/2017 revealing total cholesterol 88, LDL of 42 and HDL of 35.  Carotid artery disease History of carotid artery disease with a right carotid bruit and Dopplers performed 01/30/2016 revealing a right external carotid artery stenosis.  I am going to repeat carotid Doppler studies.  CAD S/P percutaneous coronary angioplasty History of CAD status post LAD and RCA intervention by Dr. Wynonia Lawman in 2005.  She denies chest pain or shortness of breath.  Pleural effusion History of malignant pleural effusion status post thoracentesis with decreased breath sounds left base today.      Lorretta Harp MD FACP,FACC,FAHA, [facility] 10/14/2018 9:41 AM

## 2018-10-14 NOTE — Assessment & Plan Note (Signed)
History of malignant pleural effusion status post thoracentesis with decreased breath sounds left base today.

## 2018-10-19 ENCOUNTER — Inpatient Hospital Stay (HOSPITAL_COMMUNITY): Admission: RE | Admit: 2018-10-19 | Payer: Medicare Other | Source: Ambulatory Visit

## 2018-10-30 ENCOUNTER — Ambulatory Visit (HOSPITAL_COMMUNITY)
Admission: RE | Admit: 2018-10-30 | Discharge: 2018-10-30 | Disposition: A | Discharge status: Home / Self Care | Payer: Medicare Other | Source: Ambulatory Visit | Attending: Hematology and Oncology | Admitting: Hematology and Oncology

## 2018-10-30 ENCOUNTER — Other Ambulatory Visit: Payer: Self-pay

## 2018-10-30 DIAGNOSIS — C7800 Secondary malignant neoplasm of unspecified lung: Secondary | ICD-10-CM | POA: Insufficient documentation

## 2018-10-30 DIAGNOSIS — C7951 Secondary malignant neoplasm of bone: Secondary | ICD-10-CM | POA: Insufficient documentation

## 2018-10-30 DIAGNOSIS — C787 Secondary malignant neoplasm of liver and intrahepatic bile duct: Secondary | ICD-10-CM | POA: Diagnosis not present

## 2018-10-30 DIAGNOSIS — C50912 Malignant neoplasm of unspecified site of left female breast: Secondary | ICD-10-CM | POA: Diagnosis not present

## 2018-10-30 DIAGNOSIS — Z5111 Encounter for antineoplastic chemotherapy: Secondary | ICD-10-CM | POA: Diagnosis not present

## 2018-10-30 MED ORDER — SODIUM CHLORIDE (PF) 0.9 % IJ SOLN
INTRAMUSCULAR | Status: AC
  Filled 2018-10-30: qty 50

## 2018-10-30 MED ORDER — IOHEXOL 300 MG/ML  SOLN
100.0000 mL | Freq: Once | INTRAMUSCULAR | Status: AC | PRN
  Administered 2018-10-30: 100 mL via INTRAVENOUS

## 2018-11-02 ENCOUNTER — Encounter: Payer: Self-pay | Admitting: Adult Health

## 2018-11-02 ENCOUNTER — Inpatient Hospital Stay: Payer: Medicare Other

## 2018-11-02 ENCOUNTER — Telehealth: Payer: Self-pay | Admitting: Hematology and Oncology

## 2018-11-02 ENCOUNTER — Inpatient Hospital Stay (HOSPITAL_BASED_OUTPATIENT_CLINIC_OR_DEPARTMENT_OTHER): Payer: Medicare Other | Admitting: Adult Health

## 2018-11-02 ENCOUNTER — Inpatient Hospital Stay: Payer: Medicare Other | Attending: Hematology and Oncology

## 2018-11-02 VITALS — BP 142/61 | HR 86 | Temp 98.3°F | Resp 18 | Ht 68.0 in | Wt 141.6 lb

## 2018-11-02 DIAGNOSIS — C7951 Secondary malignant neoplasm of bone: Secondary | ICD-10-CM

## 2018-11-02 DIAGNOSIS — Z170 Estrogen receptor positive status [ER+]: Principal | ICD-10-CM

## 2018-11-02 DIAGNOSIS — C7800 Secondary malignant neoplasm of unspecified lung: Secondary | ICD-10-CM | POA: Diagnosis not present

## 2018-11-02 DIAGNOSIS — Z79818 Long term (current) use of other agents affecting estrogen receptors and estrogen levels: Secondary | ICD-10-CM | POA: Insufficient documentation

## 2018-11-02 DIAGNOSIS — Z923 Personal history of irradiation: Secondary | ICD-10-CM

## 2018-11-02 DIAGNOSIS — Z87891 Personal history of nicotine dependence: Secondary | ICD-10-CM | POA: Insufficient documentation

## 2018-11-02 DIAGNOSIS — C50412 Malignant neoplasm of upper-outer quadrant of left female breast: Secondary | ICD-10-CM

## 2018-11-02 DIAGNOSIS — C787 Secondary malignant neoplasm of liver and intrahepatic bile duct: Secondary | ICD-10-CM | POA: Diagnosis not present

## 2018-11-02 DIAGNOSIS — Z9012 Acquired absence of left breast and nipple: Secondary | ICD-10-CM

## 2018-11-02 DIAGNOSIS — Z9221 Personal history of antineoplastic chemotherapy: Secondary | ICD-10-CM

## 2018-11-02 DIAGNOSIS — I2510 Atherosclerotic heart disease of native coronary artery without angina pectoris: Secondary | ICD-10-CM

## 2018-11-02 LAB — CMP (CANCER CENTER ONLY)
ALT: 10 U/L (ref 0–44)
AST: 19 U/L (ref 15–41)
Albumin: 3.5 g/dL (ref 3.5–5.0)
Alkaline Phosphatase: 55 U/L (ref 38–126)
Anion gap: 10 (ref 5–15)
BUN: 15 mg/dL (ref 8–23)
CO2: 24 mmol/L (ref 22–32)
Calcium: 9.3 mg/dL (ref 8.9–10.3)
Chloride: 108 mmol/L (ref 98–111)
Creatinine: 0.91 mg/dL (ref 0.44–1.00)
GFR, Est AFR Am: >60 mL/min (ref >60)
GFR, Estimated: >60 mL/min (ref >60)
Glucose, Bld: 87 mg/dL (ref 70–99)
Potassium: 3.6 mmol/L (ref 3.5–5.1)
Sodium: 142 mmol/L (ref 135–145)
Total Bilirubin: 0.5 mg/dL (ref 0.3–1.2)
Total Protein: 6.9 g/dL (ref 6.5–8.1)

## 2018-11-02 LAB — CBC WITH DIFFERENTIAL (CANCER CENTER ONLY)
Abs Immature Granulocytes: 0.01 10*3/uL (ref 0.00–0.07)
Basophils Absolute: 0 10*3/uL (ref 0.0–0.1)
Basophils Relative: 2 %
Eosinophils Absolute: 0 10*3/uL (ref 0.0–0.5)
Eosinophils Relative: 2 %
HCT: 32 % — ABNORMAL LOW (ref 36.0–46.0)
Hemoglobin: 10.4 g/dL — ABNORMAL LOW (ref 12.0–15.0)
Immature Granulocytes: 1 %
Lymphocytes Relative: 24 %
Lymphs Abs: 0.5 10*3/uL — ABNORMAL LOW (ref 0.7–4.0)
MCH: 33 pg (ref 26.0–34.0)
MCHC: 32.5 g/dL (ref 30.0–36.0)
MCV: 101.6 fL — ABNORMAL HIGH (ref 80.0–100.0)
Monocytes Absolute: 0.3 10*3/uL (ref 0.1–1.0)
Monocytes Relative: 13 %
Neutro Abs: 1.2 10*3/uL — ABNORMAL LOW (ref 1.7–7.7)
Neutrophils Relative %: 58 %
Platelet Count: 153 10*3/uL (ref 150–400)
RBC: 3.15 MIL/uL — ABNORMAL LOW (ref 3.87–5.11)
RDW: 16.5 % — ABNORMAL HIGH (ref 11.5–15.5)
WBC Count: 2 10*3/uL — ABNORMAL LOW (ref 4.0–10.5)
nRBC: 0 % (ref 0.0–0.2)

## 2018-11-02 MED ORDER — FULVESTRANT 250 MG/5ML IM SOLN
INTRAMUSCULAR | Status: AC
  Filled 2018-11-02: qty 5

## 2018-11-02 MED ORDER — FULVESTRANT 250 MG/5ML IM SOLN
500.0000 mg | Freq: Once | INTRAMUSCULAR | Status: AC
  Administered 2018-11-02: 500 mg via INTRAMUSCULAR

## 2018-11-02 MED FILL — MEGESTROL 40 MG TABLET: 40 | 30 days supply | Qty: 30 | Fill #3

## 2018-11-02 MED FILL — IBRANCE 100 MG CAPSULE: 100 | 28 days supply | Qty: 21 | Fill #3

## 2018-11-02 NOTE — Patient Instructions (Signed)

## 2018-11-02 NOTE — Progress Notes (Addendum)
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 [name], T3N2 (5/10 LN positive) stage IIIa IDC grade 1, ER/PR 100%, TSH 14% HER-2 negative PET/CT 12/20/2013 negative for metastases    12/17/2013 - 04/02/2014 Chemotherapy    Taxotere Cytoxan every 3 weeks for 6 cycles    05/11/2014 - 06/27/2014 Radiation Therapy    Adjuvant radiation therapy to chest wall and lymph nodes    07/06/2014 -  Anti-estrogen oral therapy    Arimidex 1 mg daily    12/21/2017 Imaging    Metastatic lesions noted in the lungs bilaterally, bilateral hilar and mediastinal lymph nodes, left pleural effusion, multiple thoracic vertebral metastases, heterogeneous lesions in the liver    12/22/2017 Procedure    Pleural Effusion Thoracentesis: Metastatic Breast cancer ER PR Positive Her 2 Neg    12/29/2017 Treatment Plan Change    Ibrance, Faslodex and Delton See     CURRENT THERAPY: [name]  INTERVAL HISTORY: Wendy Wendy [name] 77 y.o. Wendy [name] returns for evalaution of her metastatic breast cancer.  She is  receiving Faslodex every 4 weeks and toelrates this well, with the exception of injection site pain.  She is taking Ibrance 154m 3 weeks on and 1 week off.  She is due to start tomorrow.  She is tolerating this well.  She also receives XNigerevery 12 weeks, next due in January.  She is tolerating this well. She denies any dental issues, or concnerns today.  [name] a decreased appetite. She is down two pounds in the past 4 weeks.  She says she forces herself to eat.  She takes Megace daily and hasn't noted a difference since starting it.  She hasn't tried anything else.  She is fatigued.  She rations her activities to be able to remain to do things.  [name] lives alone and sits and reads.  She does all of the things she needs to do, such as housework, cooking, cleaning, grocery shopping.  She denies any new pain.  She notes that she feels like she is choking, she says she tries to cough things up and it rarely comes up.    OTesiahas an appointment for cataract surgery.  She was instructed by Dr. GLindi Adieto stop the [place] prior and one week after.  Dr. RRueben Bashher ophthalmologist let her know that his patients have never had to stop any medications for the procedure and was unsure if holding the Ibrance was necessary.     Patient Active Problem List   Diagnosis Date Noted  . Liver metastases (  [place]) 11/02/2018  . Acute diastolic CHF (congestive heart failure) ([place]) 09/01/2018  . Pleural effusion 09/01/2018  . Bone metastases ([place]) 05/27/2018  . Metastatic cancer to lung ([place]) 12/22/2017  . Streptococcal bacteremia 09/24/2017  . CKD (chronic kidney disease), stage III ([place]) 09/22/2017  . Sepsis ([place]) 09/22/2017  . Lower urinary tract infectious disease 09/22/2017  . Chest pain 09/22/2017  . Left arm cellulitis 09/22/2017  . Obesity (BMI 30-39.9) 01/06/2014  . Hyperlipidemia 01/06/2014  . CAD S/P percutaneous coronary angioplasty   . Essential hypertension   . Carotid artery disease  ([place])   . 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  MEDICAL HISTORY: Past Medical History:  Diagnosis Date  . Breast cancer    left breast  . CAD (coronary artery disease)   . Hyperlipidemia   . Hypertension   . Myocardial infarction ([place]) 11/22/04  . Peripheral vascular disease ([place])    atherosclerosis carotid artery-mild    SURGICAL HISTORY: Past Surgical History:  Procedure Laterality Date  . ABDOMINAL HYSTERECTOMY  1982  . KNEE ARTHROSCOPY     knee  . MASTECTOMY MODIFIED RADICAL Left 11/12/2013   Procedure: MASTECTOMY MODIFIED RADICAL;  Surgeon: Rolm Bookbinder, MD;  Location: [facility];  Service: General;  Laterality: Left;  . 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 PERCUTANEOUS CORONARY STENT INTERVENTION (PCI-S)  2005  . PORTACATH PLACEMENT Right 12/16/2013   Procedure: INSERTION PORT-A-CATH;  Surgeon: Rolm Bookbinder, MD;  Location: [facility];  Service: General;  Laterality: Right;  . TEE WITHOUT CARDIOVERSION N/A 09/26/2017   Procedure: TRANSESOPHAGEAL ECHOCARDIOGRAM (TEE);  Surgeon: Dorothy Spark, MD;  Location: [facility] ENDOSCOPY;  Service: Cardiovascular;  Laterality: N/A;    SOCIAL HISTORY: Social History   Socioeconomic History  . Marital status: Divorced    Spouse name: Not on file  . Number of children: 3  . Years of education: Not on file  . Highest education level: Not on file  Occupational History    Employer: [employer]  . Financial resource strain: Not on file  . Food insecurity:    Worry: Not on file    Inability: Not on file  . Transportation needs:    Medical: Not on file    Non-medical: Not on file  Tobacco Use  . Smoking status: Former Smoker    Packs/day: 0.30    Types: Cigarettes    Last attempt to quit: 11/09/1983    Years since quitting: 35.0  . Smokeless tobacco: Never Used  Substance and Sexual  Activity  . Alcohol use: Not Currently  . Drug use: No  . Sexual activity: Not Currently  Lifestyle  . Physical activity:    Days per week: Not on file    Minutes per session: Not on file  . Stress: Not on file  Relationships  . Social connections:    Talks on phone: Not on file    Gets together: Not on file    Attends religious service: Not on file    Active member of club or organization: Not on file    Attends meetings of clubs or organizations: Not on file    Relationship status: Not on file  . Intimate partner violence:    Fear of current or ex partner: Not on file    Emotionally abused: Not on file    Physically abused: Not on file  Forced sexual activity: Not on file  Other Topics Concern  . Not on file  Social History Narrative  . Not on file    FAMILY HISTORY: Family History  Problem Relation Age of Onset  . Emphysema Mother   . Heart attack Father     Review of Systems  Constitutional: Positive for appetite change and fatigue. Negative for chills and unexpected weight change.  HENT:   Negative for hearing loss, lump/mass and trouble swallowing.   Eyes: Negative for eye problems and icterus.  Respiratory: Positive for cough and shortness of breath (stable). Negative for chest tightness.   Cardiovascular: Negative for chest pain, leg swelling and palpitations.  Gastrointestinal: Negative for abdominal distention, abdominal pain, constipation, diarrhea, nausea and vomiting.  Endocrine: Negative for hot flashes.  Skin: Negative for itching and rash.  Neurological: Negative for dizziness, extremity weakness, headaches and numbness.  Hematological: Negative for adenopathy. Does not bruise/bleed easily.  Psychiatric/Behavioral: Negative for depression. The patient is not nervous/anxious.       PHYSICAL EXAMINATION  ECOG PERFORMANCE STATUS: 2 - Symptomatic, <50% confined to bed  Vitals:   11/02/18 0933  BP: (!) 142/61  Pulse: 86  Resp: 18  Temp: 98.3 F  (36.8 C)  SpO2: 100%    Physical Exam  Constitutional: She is oriented to person, place, and time. She appears well-developed and well-nourished.  HENT:  Head: Normocephalic and atraumatic.  Mouth/Throat: Oropharynx is clear and moist. No oropharyngeal exudate.  Eyes: Pupils are equal, round, and reactive to light. No scleral icterus.  Neck: Neck supple.  Cardiovascular: Normal rate, regular rhythm and normal heart sounds.  Pulmonary/Chest: Effort normal.  Diminished in left lower lobe, left breast s/p mastectomy, no sign of local recurrence, telangectasia noted on scar line, right breast benign  Abdominal: Soft. Bowel sounds are normal. She exhibits no distension. There is no tenderness.  Neurological: She is alert and oriented to person, place, and time.  Skin: Skin is warm and dry. Capillary refill takes less than 2 seconds.  Psychiatric: She has a normal mood and affect.    LABORATORY DATA:  CBC    Component Value Date/Time   WBC 2.0 (L) 11/02/2018 0908   WBC 2.6 (L) 08/16/2018 1212   RBC 3.15 (L) 11/02/2018 0908   HGB 10.4 (L) 11/02/2018 0908   HGB 13.9 05/15/2015 1307   HCT 32.0 (L) 11/02/2018 0908   HCT 42.0 05/15/2015 1307   PLT 153 11/02/2018 0908   PLT 164 05/15/2015 1307   MCV 101.6 (H) 11/02/2018 0908   MCV 90.9 05/15/2015 1307   MCH 33.0 11/02/2018 0908   MCHC 32.5 11/02/2018 0908   RDW 16.5 (H) 11/02/2018 0908   RDW 14.4 05/15/2015 1307   LYMPHSABS 0.5 (L) 11/02/2018 0908   LYMPHSABS 0.8 (L) 05/15/2015 1307   MONOABS 0.3 11/02/2018 0908   MONOABS 0.6 05/15/2015 1307   EOSABS 0.0 11/02/2018 0908   EOSABS 0.2 05/15/2015 1307   BASOSABS 0.0 11/02/2018 0908   BASOSABS 0.0 05/15/2015 1307    CMP     Component Value Date/Time   NA 142 11/02/2018 0908   NA 142 05/15/2015 1308   K 3.6 11/02/2018 0908   K 4.3 05/15/2015 1308   CL 108 11/02/2018 0908   CO2 24 11/02/2018 0908   CO2 28 05/15/2015 1308   GLUCOSE 87 11/02/2018 0908   GLUCOSE 88 05/15/2015  1308   BUN 15 11/02/2018 0908   BUN 16.7 05/15/2015 1308   CREATININE 0.91 11/02/2018 0908  CREATININE 0.9 05/15/2015 1308   CALCIUM 9.3 11/02/2018 0908   CALCIUM 10.3 05/15/2015 1308   PROT 6.9 11/02/2018 0908   PROT 6.6 05/15/2015 1308   ALBUMIN 3.5 11/02/2018 0908   ALBUMIN 4.2 05/15/2015 1308   AST 19 11/02/2018 0908   AST 36 (H) 05/15/2015 1308   ALT 10 11/02/2018 0908   ALT 48 05/15/2015 1308   ALKPHOS 55 11/02/2018 0908   ALKPHOS 82 05/15/2015 1308   BILITOT 0.5 11/02/2018 0908   BILITOT 0.66 05/15/2015 1308   GFRNONAA >60 11/02/2018 0908   GFRAA >60 11/02/2018 0908           ASSESSMENT and THERAPY PLAN:   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 She had radiation therapy and has been on antiestrogen therapy with Arimidex 06/28/2014-January 2019  Thoracentesis: Met Breast cancer ER PR Positive and Her 2 Neg PET/CT scan. 01/04/18: New malignant Pleural effusion with small bil pulm nodules,new mild bilateral mediastinal hilar portocaval lymph node metastases, new liver metastases, new diffuse bone metastases. --------------------------------------------------------------------------- Current treatment: Ibrance with Faslodexstarted 12/29/2017, Delton See every 3 months Ibrance toxicities: Fatigue Shortness of breath to exertion: Possible due to CHF  Weight loss:Zofran will be scheduled every 8 hours.Patient is currently on Megace. She is down another two pounds.  We reviewed the possibility of trying Mirtazapine.  She will think about this.  Faslodex toxicities: Injection site pain  CT C/A/P 10/30/2018: shows minimal progression in hepatic metastases.  Patient met with Dr. [name].  She will continue her  current treatment with Ibrance and Faslodex.  She will start her [place] tomorrow.  She has cataract surgery on 12/20 and will not take Ibrance the week prior to her surgery.  She can restart it 2 days after completing surgery.  We will see if we can send her pleural fluid for [facility] Molecular testing.  If not, will consider doing an additional biopsy to evaluate for any other molecular abnormalities that may indicate other treatment options.  Shameeka will return in 4 weeks for labs, f/u with Dr. [name], and her Faslodex.      All questions were answered. The patient knows to call the clinic with any problems, questions or concerns. We can certainly see the patient much sooner if necessary.  This note was electronically signed. Harriette Ohara, MD 11/02/2018   Attending Note  I personally saw and examined Wendy Wendy [name]. The plan of care was discussed with her. I agree with the assessment and plan as documented above. I discussed with her that since there is only mild progression of disease we can continue with the current treatment. We will send for molecular testing Signed Harriette Ohara, MD

## 2018-11-02 NOTE — Assessment & Plan Note (Addendum)
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 She had radiation therapy and has been on antiestrogen therapy with Arimidex 06/28/2014-January 2019  Thoracentesis: Met Breast cancer ER PR Positive and Her 2 Neg PET/CT scan. 01/04/18: New malignant Pleural effusion with small bil pulm nodules,new mild bilateral mediastinal hilar portocaval lymph node metastases, new liver metastases, new diffuse bone metastases. --------------------------------------------------------------------------- Current treatment: Ibrance with Faslodexstarted 12/29/2017, Delton See every 3 months Ibrance toxicities: Fatigue Shortness of breath to exertion: Possible due to CHF  Weight loss:Zofran will be scheduled every 8 hours.Patient is currently on Megace. She is down another two pounds.  We reviewed the possibility of trying Mirtazapine.  She will think about this.  Faslodex toxicities: Injection site pain  CT C/A/P 10/30/2018: shows minimal progression in hepatic metastases.  Patient met with Dr. [name].  She will continue her current treatment with Ibrance and Faslodex.  She will start her [place] tomorrow.  She has cataract surgery on 12/20 and will not take Ibrance the week prior to her surgery.  She can restart it 2 days after completing surgery.  We will see if we can send her pleural fluid for [facility] Molecular testing.  If not, will consider doing an additional biopsy to evaluate for any other molecular abnormalities that may indicate other treatment options.  Wendy [name] will return in 4 weeks for labs, f/u with Dr. [name], and her Faslodex.

## 2018-11-02 NOTE — Telephone Encounter (Signed)
Gave patient avs and calendar.   °

## 2018-11-05 NOTE — Patient Instructions (Signed)
Your procedure is scheduled on: 11/13/2018  Report to [facility] at  36      AM.  Call this number if you have problems the morning of surgery: [phone]   Do not eat food or drink liquids :After Midnight.      Take these medicines the morning of surgery with A SIP OF WATER: zofran or phenergan ( if needed), oxycodone ( if needed).   Do not wear jewelry, make-up or nail polish.  Do not wear lotions, powders, or perfumes. You may wear deodorant.  Do not shave 48 hours prior to surgery.  Do not bring valuables to the hospital.  Contacts, dentures or bridgework may not be worn into surgery.  Leave suitcase in the car. After surgery it may be brought to your room.  For patients admitted to the hospital, checkout time is 11:00 AM the day of discharge.   Patients discharged the day of surgery will not be allowed to drive home.  :     Please read over the following fact sheets that you were given: Coughing and Deep Breathing, Surgical Site Infection Prevention, Anesthesia Post-op Instructions and Care and Recovery After Surgery    Cataract A cataract is a clouding of the lens of the eye. When a lens becomes cloudy, vision is reduced based on the degree and nature of the clouding. Many cataracts reduce vision to some degree. Some cataracts make people more near-sighted as they develop. Other cataracts increase glare. Cataracts that are ignored and become worse can sometimes look white. The white color can be seen through the pupil. CAUSES   Aging. However, cataracts may occur at any age, even in newborns.   Certain drugs.   Trauma to the eye.   Certain diseases such as diabetes.   Specific eye diseases such as chronic inflammation inside the eye or a sudden attack of a rare form of glaucoma.   Inherited or acquired medical problems.  SYMPTOMS   Gradual, progressive drop in vision in the affected eye.   Severe, rapid visual loss. This most often happens when trauma is the cause.    DIAGNOSIS  To detect a cataract, an eye doctor examines the lens. Cataracts are best diagnosed with an exam of the eyes with the pupils enlarged (dilated) by drops.  TREATMENT  For an early cataract, vision may improve by using different eyeglasses or stronger lighting. If that does not help your vision, surgery is the only effective treatment. A cataract needs to be surgically removed when vision loss interferes with your everyday activities, such as driving, reading, or watching TV. A cataract may also have to be removed if it prevents examination or treatment of another eye problem. Surgery removes the cloudy lens and usually replaces it with a substitute lens (intraocular lens, IOL).  At a time when both you and your doctor agree, the cataract will be surgically removed. If you have cataracts in both eyes, only one is usually removed at a time. This allows the operated eye to heal and be out of danger from any possible problems after surgery (such as infection or poor wound healing). In rare cases, a cataract may be doing damage to your eye. In these cases, your caregiver may advise surgical removal right away. The vast majority of people who have cataract surgery have better vision afterward. HOME CARE INSTRUCTIONS  If you are not planning surgery, you may be asked to do the following:  Use different eyeglasses.   Use stronger or  brighter lighting.   Ask your eye doctor about reducing your medicine dose or changing medicines if it is thought that a medicine caused your cataract. Changing medicines does not make the cataract go away on its own.   Become familiar with your surroundings. Poor vision can lead to injury. Avoid bumping into things on the affected side. You are at a higher risk for tripping or falling.   Exercise extreme care when driving or operating machinery.   Wear sunglasses if you are sensitive to bright light or experiencing problems with glare.  SEEK IMMEDIATE MEDICAL CARE  IF:   You have a worsening or sudden vision loss.   You notice redness, swelling, or increasing pain in the eye.   You have a fever.  Document Released: 11/11/2005 Document Revised: 10/31/2011 Document Reviewed: 07/05/2011 [facility] Patient Information 2012 [place].PATIENT INSTRUCTIONS POST-ANESTHESIA  IMMEDIATELY FOLLOWING SURGERY:  Do not drive or operate machinery for the first twenty four hours after surgery.  Do not make any important decisions for twenty four hours after surgery or while taking narcotic pain medications or sedatives.  If you develop intractable nausea and vomiting or a severe headache please notify your doctor immediately.  FOLLOW-UP:  Please make an appointment with your surgeon as instructed. You do not need to follow up with anesthesia unless specifically instructed to do so.  WOUND CARE INSTRUCTIONS (if applicable):  Keep a dry clean dressing on the anesthesia/puncture wound site if there is drainage.  Once the wound has quit draining you may leave it open to air.  Generally you should leave the bandage intact for twenty four hours unless there is drainage.  If the epidural site drains for more than 36-48 hours please call the anesthesia department.  QUESTIONS?:  Please feel free to call your physician or the hospital operator if you have any questions, and they will be happy to assist you.           Darreld Mclean

## 2018-11-06 ENCOUNTER — Telehealth: Payer: Self-pay

## 2018-11-06 DIAGNOSIS — H25811 Combined forms of age-related cataract, right eye: Secondary | ICD-10-CM | POA: Diagnosis not present

## 2018-11-06 NOTE — Telephone Encounter (Addendum)
Oral Oncology Patient Advocate Encounter  I was successful at securing a grant with [employer] for $15,000. This will keep the out of pocket expense for [place] at $0. The grant information is as follows and has been shared with [place].  Approval dates: 12/01/18-12/01/19 ID: [id number] Group: [id number] BIN: [id number] PCN: [id number]  I called the patient and gave her the good news, she verbalized understanding and great appreciation.  [place] [facility] [place] Phone [phone] Fax [phone]

## 2018-11-10 ENCOUNTER — Encounter (HOSPITAL_COMMUNITY)
Admission: RE | Admit: 2018-11-10 | Discharge: 2018-11-10 | Disposition: A | Discharge status: Home / Self Care | Payer: Medicare Other | Source: Ambulatory Visit | Attending: Ophthalmology | Admitting: Ophthalmology

## 2018-11-10 ENCOUNTER — Other Ambulatory Visit: Payer: Self-pay

## 2018-11-10 ENCOUNTER — Encounter (HOSPITAL_COMMUNITY): Payer: Self-pay

## 2018-11-10 DIAGNOSIS — Z01812 Encounter for preprocedural laboratory examination: Secondary | ICD-10-CM

## 2018-11-10 DIAGNOSIS — Z853 Personal history of malignant neoplasm of breast: Secondary | ICD-10-CM | POA: Diagnosis not present

## 2018-11-10 DIAGNOSIS — Z79899 Other long term (current) drug therapy: Secondary | ICD-10-CM | POA: Diagnosis not present

## 2018-11-10 DIAGNOSIS — I509 Heart failure, unspecified: Secondary | ICD-10-CM | POA: Diagnosis not present

## 2018-11-10 DIAGNOSIS — Z87891 Personal history of nicotine dependence: Secondary | ICD-10-CM | POA: Diagnosis not present

## 2018-11-10 DIAGNOSIS — I739 Peripheral vascular disease, unspecified: Secondary | ICD-10-CM | POA: Diagnosis not present

## 2018-11-10 DIAGNOSIS — N289 Disorder of kidney and ureter, unspecified: Secondary | ICD-10-CM | POA: Diagnosis not present

## 2018-11-10 DIAGNOSIS — I2510 Atherosclerotic heart disease of native coronary artery without angina pectoris: Secondary | ICD-10-CM | POA: Diagnosis not present

## 2018-11-10 DIAGNOSIS — E7800 Pure hypercholesterolemia, unspecified: Secondary | ICD-10-CM | POA: Diagnosis not present

## 2018-11-10 DIAGNOSIS — I252 Old myocardial infarction: Secondary | ICD-10-CM | POA: Diagnosis not present

## 2018-11-10 DIAGNOSIS — I110 Hypertensive heart disease with heart failure: Secondary | ICD-10-CM | POA: Diagnosis not present

## 2018-11-10 DIAGNOSIS — H269 Unspecified cataract: Secondary | ICD-10-CM | POA: Diagnosis not present

## 2018-11-10 LAB — CBC
HCT: 32.5 % — ABNORMAL LOW (ref 36.0–46.0)
Hemoglobin: 10.2 g/dL — ABNORMAL LOW (ref 12.0–15.0)
MCH: 32.8 pg (ref 26.0–34.0)
MCHC: 31.4 g/dL (ref 30.0–36.0)
MCV: 104.5 fL — ABNORMAL HIGH (ref 80.0–100.0)
Platelets: 270 10*3/uL (ref 150–400)
RBC: 3.11 MIL/uL — ABNORMAL LOW (ref 3.87–5.11)
RDW: 16.4 % — ABNORMAL HIGH (ref 11.5–15.5)
WBC: 2.3 10*3/uL — ABNORMAL LOW (ref 4.0–10.5)
nRBC: 0 % (ref 0.0–0.2)

## 2018-11-13 ENCOUNTER — Encounter (HOSPITAL_COMMUNITY): Payer: Self-pay | Admitting: *Deleted

## 2018-11-13 ENCOUNTER — Encounter (HOSPITAL_COMMUNITY)
Admission: RE | Disposition: A | Discharge status: Home / Self Care | Payer: Self-pay | Source: Home / Self Care | Attending: Ophthalmology

## 2018-11-13 ENCOUNTER — Ambulatory Visit (HOSPITAL_COMMUNITY): Payer: Medicare Other | Admitting: Anesthesiology

## 2018-11-13 ENCOUNTER — Ambulatory Visit (HOSPITAL_COMMUNITY)
Admission: RE | Admit: 2018-11-13 | Discharge: 2018-11-13 | Disposition: A | Discharge status: Home / Self Care | Payer: Medicare Other | Attending: Ophthalmology | Admitting: Ophthalmology

## 2018-11-13 DIAGNOSIS — I110 Hypertensive heart disease with heart failure: Secondary | ICD-10-CM | POA: Insufficient documentation

## 2018-11-13 DIAGNOSIS — E7800 Pure hypercholesterolemia, unspecified: Secondary | ICD-10-CM | POA: Diagnosis not present

## 2018-11-13 DIAGNOSIS — I739 Peripheral vascular disease, unspecified: Secondary | ICD-10-CM | POA: Insufficient documentation

## 2018-11-13 DIAGNOSIS — I2510 Atherosclerotic heart disease of native coronary artery without angina pectoris: Secondary | ICD-10-CM | POA: Diagnosis not present

## 2018-11-13 DIAGNOSIS — H25811 Combined forms of age-related cataract, right eye: Secondary | ICD-10-CM | POA: Diagnosis not present

## 2018-11-13 DIAGNOSIS — N289 Disorder of kidney and ureter, unspecified: Secondary | ICD-10-CM | POA: Diagnosis not present

## 2018-11-13 DIAGNOSIS — I509 Heart failure, unspecified: Secondary | ICD-10-CM | POA: Diagnosis not present

## 2018-11-13 DIAGNOSIS — Z87891 Personal history of nicotine dependence: Secondary | ICD-10-CM | POA: Insufficient documentation

## 2018-11-13 DIAGNOSIS — Z79899 Other long term (current) drug therapy: Secondary | ICD-10-CM | POA: Diagnosis not present

## 2018-11-13 DIAGNOSIS — H269 Unspecified cataract: Secondary | ICD-10-CM | POA: Insufficient documentation

## 2018-11-13 DIAGNOSIS — I252 Old myocardial infarction: Secondary | ICD-10-CM | POA: Diagnosis not present

## 2018-11-13 DIAGNOSIS — I10 Essential (primary) hypertension: Secondary | ICD-10-CM | POA: Diagnosis not present

## 2018-11-13 DIAGNOSIS — Z853 Personal history of malignant neoplasm of breast: Secondary | ICD-10-CM | POA: Insufficient documentation

## 2018-11-13 HISTORY — PX: CATARACT EXTRACTION W/PHACO: SHX586

## 2018-11-13 SURGERY — PHACOEMULSIFICATION, CATARACT, WITH IOL INSERTION
Anesthesia: Monitor Anesthesia Care | Laterality: Right

## 2018-11-13 MED ORDER — NEOMYCIN-POLYMYXIN-DEXAMETH 3.5-10000-0.1 OP SUSP
OPHTHALMIC | Status: DC | PRN
  Administered 2018-11-13: 2 [drp] via OPHTHALMIC

## 2018-11-13 MED ORDER — EPINEPHRINE PF 1 MG/ML IJ SOLN
INTRAOCULAR | Status: DC | PRN
  Administered 2018-11-13: 500 mL

## 2018-11-13 MED ORDER — CYCLOPENTOLATE-PHENYLEPHRINE 0.2-1 % OP SOLN
1.0000 [drp] | OPHTHALMIC | Status: AC
  Administered 2018-11-13 (×3): 1 [drp] via OPHTHALMIC

## 2018-11-13 MED ORDER — MIDAZOLAM HCL 2 MG/2ML IJ SOLN
INTRAMUSCULAR | Status: AC
  Filled 2018-11-13: qty 2

## 2018-11-13 MED ORDER — LIDOCAINE HCL (PF) 1 % IJ SOLN
INTRAOCULAR | Status: DC | PRN
  Administered 2018-11-13: 1 mL via OPHTHALMIC

## 2018-11-13 MED ORDER — TETRACAINE HCL 0.5 % OP SOLN
1.0000 [drp] | OPHTHALMIC | Status: AC
  Administered 2018-11-13 (×3): 1 [drp] via OPHTHALMIC

## 2018-11-13 MED ORDER — LIDOCAINE HCL 3.5 % OP GEL
1.0000 "application " | Freq: Once | OPHTHALMIC | Status: AC
  Administered 2018-11-13: 1 via OPHTHALMIC

## 2018-11-13 MED ORDER — SODIUM HYALURONATE 23 MG/ML IO SOLN
INTRAOCULAR | Status: DC | PRN
  Administered 2018-11-13: 0.6 mL via INTRAOCULAR

## 2018-11-13 MED ORDER — PHENYLEPHRINE HCL 2.5 % OP SOLN
1.0000 [drp] | OPHTHALMIC | Status: AC
  Administered 2018-11-13 (×3): 1 [drp] via OPHTHALMIC

## 2018-11-13 MED ORDER — MIDAZOLAM HCL 5 MG/5ML IJ SOLN
INTRAMUSCULAR | Status: DC | PRN
  Administered 2018-11-13 (×2): 1 mg via INTRAVENOUS

## 2018-11-13 MED ORDER — LACTATED RINGERS IV SOLN
INTRAVENOUS | Status: DC
  Administered 2018-11-13: 07:00:00 via INTRAVENOUS

## 2018-11-13 MED ORDER — BSS IO SOLN
INTRAOCULAR | Status: DC | PRN
  Administered 2018-11-13: 15 mL

## 2018-11-13 MED ORDER — EPINEPHRINE PF 1 MG/ML IJ SOLN
INTRAMUSCULAR | Status: AC
  Filled 2018-11-13: qty 2

## 2018-11-13 MED ORDER — POVIDONE-IODINE 5 % OP SOLN
OPHTHALMIC | Status: DC | PRN
  Administered 2018-11-13: 1 via OPHTHALMIC

## 2018-11-13 MED ORDER — PROVISC 10 MG/ML IO SOLN
INTRAOCULAR | Status: DC | PRN
  Administered 2018-11-13: 0.85 mL via INTRAOCULAR

## 2018-11-13 SURGICAL SUPPLY — 12 items
CLOTH BEACON ORANGE TIMEOUT ST (SAFETY) ×2 IMPLANT
EYE SHIELD UNIVERSAL CLEAR (GAUZE/BANDAGES/DRESSINGS) ×2 IMPLANT
GLOVE BIOGEL PI IND STRL 7.0 (GLOVE) ×2 IMPLANT
GLOVE BIOGEL PI INDICATOR 7.0 (GLOVE) ×2
LENS ALC ACRYL/TECN (Ophthalmic Related) ×2 IMPLANT
NEEDLE HYPO 18GX1.5 BLUNT FILL (NEEDLE) ×2 IMPLANT
PAD ARMBOARD 7.5X6 YLW CONV (MISCELLANEOUS) ×2 IMPLANT
SYR TB 1ML LL NO SAFETY (SYRINGE) ×2 IMPLANT
TAPE SURG TRANSPORE 1 IN (GAUZE/BANDAGES/DRESSINGS) ×1 IMPLANT
TAPE SURGICAL TRANSPORE 1 IN (GAUZE/BANDAGES/DRESSINGS) ×1
VISCOELASTIC ADDITIONAL (OPHTHALMIC RELATED) ×2 IMPLANT
WATER STERILE IRR 250ML POUR (IV SOLUTION) ×2 IMPLANT

## 2018-11-13 NOTE — Anesthesia Preprocedure Evaluation (Signed)
Anesthesia Evaluation  Patient identified by MRN, date of birth, ID band Patient awake    Reviewed: Allergy & Precautions, H&P , NPO status , Patient's Chart, lab work & pertinent test results  Airway Mallampati: II  TM Distance: >3 FB Neck ROM: full    Dental no notable dental hx.    Pulmonary neg pulmonary ROS, former smoker,    Pulmonary exam normal breath sounds clear to auscultation       Cardiovascular Exercise Tolerance: Good hypertension, + CAD, + Past MI, + Peripheral Vascular Disease and +CHF   Rhythm:regular Rate:Normal     Neuro/Psych negative neurological ROS  negative psych ROS   GI/Hepatic negative GI ROS, Neg liver ROS,   Endo/Other  negative endocrine ROS  Renal/GU Renal disease  negative genitourinary   Musculoskeletal   Abdominal   Peds  Hematology negative hematology ROS (+)   Anesthesia Other Findings   Reproductive/Obstetrics negative OB ROS                             Anesthesia Physical Anesthesia Plan  ASA: III  Anesthesia Plan: MAC   Post-op Pain Management:    Induction:   PONV Risk Score and Plan:   Airway Management Planned:   Additional Equipment:   Intra-op Plan:   Post-operative Plan:   Informed Consent: I have reviewed the patients History and Physical, chart, labs and discussed the procedure including the risks, benefits and alternatives for the proposed anesthesia with the patient or authorized representative who has indicated his/her understanding and acceptance.   Dental Advisory Given  Plan Discussed with: CRNA  Anesthesia Plan Comments:         Anesthesia Quick Evaluation

## 2018-11-13 NOTE — Anesthesia Procedure Notes (Signed)
Date/Time: 11/13/2018 7:32 AM Performed by: Talbot Grumbling, CRNA Oxygen Delivery Method: Nasal cannula

## 2018-11-13 NOTE — Anesthesia Postprocedure Evaluation (Signed)
Anesthesia Post Note  Patient: [name]  Procedure(s) Performed: CATARACT EXTRACTION PHACO AND INTRAOCULAR LENS PLACEMENT RIGHT EYE (Right )  Patient location during evaluation: PACU Anesthesia Type: MAC Level of consciousness: awake and alert Pain management: pain level controlled Vital Signs Assessment: post-procedure vital signs reviewed and stable Respiratory status: spontaneous breathing Cardiovascular status: blood pressure returned to baseline Postop Assessment: no apparent nausea or vomiting Anesthetic complications: no     Last Vitals:  Vitals:   11/13/18 0642  BP: (!) 134/50  Pulse: 69  Resp: 18  Temp: 36.4 C  SpO2: 100%    Last Pain:  Vitals:   11/13/18 0642  TempSrc: Oral  PainSc: 0-No pain                 Jenean Escandon

## 2018-11-13 NOTE — Op Note (Signed)
Date of procedure: 11/13/18  Pre-operative diagnosis: Visually significant cataract, Right Eye (H25.811)  Post-operative diagnosis: Visually significant cataract, Right Eye  Procedure: Removal of cataract via phacoemulsification and insertion of intra-ocular lens [employer] PCB00  +24.0D into the capsular bag of the Right Eye  Attending surgeon: Gerda Diss. Adryan Druckenmiller, MD, MA  Anesthesia: MAC, Topical [place]  Complications: None  Estimated Blood Loss: <71m (minimal)  Specimens: None  Implants: As above  Indications:  Visually significant cataract, Right Eye  Procedure:  The patient was seen and identified in the pre-operative area. The operative eye was identified and dilated.  The operative eye was marked.  Topical anesthesia was administered to the operative eye.     The patient was then to the operative suite and placed in the supine position.  A timeout was performed confirming the patient, procedure to be performed, and all other relevant information.   The patient's face was prepped and draped in the usual fashion for intra-ocular surgery.  A lid speculum was placed into the operative eye and the surgical microscope moved into place and focused.  A superotemporal paracentesis was created using a 20 gauge paracentesis blade.  Shugarcaine was injected into the anterior chamber.  Viscoelastic was injected into the anterior chamber.  A temporal clear-corneal main wound incision was created using a 2.413mmicrokeratome.  A continuous curvilinear capsulorrhexis was initiated using an irrigating cystitome and completed using capsulorrhexis forceps.  Hydrodissection and hydrodeliniation were performed.  Viscoelastic was injected into the anterior chamber.  A phacoemulsification handpiece and a chopper as a second instrument were used to remove the nucleus and epinucleus. The irrigation/aspiration handpiece was used to remove any remaining cortical material.   The capsular bag was  reinflated with viscoelastic, checked, and found to be intact.  The intraocular lens was inserted into the capsular bag and dialed into place using a Kuglen hook.  The irrigation/aspiration handpiece was used to remove any remaining viscoelastic.  The clear corneal wound and paracentesis wounds were then hydrated and checked with Weck-Cels to be watertight.  The lid-speculum and drape was removed, and the patient's face was cleaned with a wet and dry 4x4.  Maxitrol was instilled in the eye before a clear shield was taped over the eye. The patient was taken to the post-operative care unit in good condition, having tolerated the procedure well.  Post-Op Instructions: [facility] and will receive all other orders and instructions.

## 2018-11-13 NOTE — Progress Notes (Signed)
Patient arrived to post op area with small skin tear to right cheek, 0.6x0.1cm. Patient and family aware. Dr. Derry Skill made aware and came to visualize. No new orders.

## 2018-11-13 NOTE — Discharge Instructions (Addendum)
Please discharge patient when stable, will follow up today with Dr. Marisa Hua at the [facility] office immediately following discharge.  Leave shield in place until visit.  All paperwork with discharge instructions will be given at the office.    Moderate Conscious Sedation, Adult, Care After These instructions provide you with information about caring for yourself after your procedure. Your health care provider may also give you more specific instructions. Your treatment has been planned according to current medical practices, but problems sometimes occur. Call your health care provider if you have any problems or questions after your procedure. What can I expect after the procedure? After your procedure, it is common:  To feel sleepy for several hours.  To feel clumsy and have poor balance for several hours.  To have poor judgment for several hours.  To vomit if you eat too soon. Follow these instructions at home: For at least 24 hours after the procedure:   Do not: ? Participate in activities where you could fall or become injured. ? Drive. ? Use heavy machinery. ? Drink alcohol. ? Take sleeping pills or medicines that cause drowsiness. ? Make important decisions or sign legal documents. ? Take care of children on your own.  Rest. Eating and drinking  Follow the diet recommended by your health care provider.  If you vomit: ? Drink water, juice, or soup when you can drink without vomiting. ? Make sure you have little or no nausea before eating solid foods. General instructions  Have a responsible adult stay with you until you are awake and alert.  Take over-the-counter and prescription medicines only as told by your health care provider.  If you smoke, do not smoke without supervision.  Keep all follow-up visits as told by your health care provider. This is important. Contact a health care provider if:  You keep feeling nauseous or you keep vomiting.  You  feel light-headed.  You develop a rash.  You have a fever. Get help right away if:  You have trouble breathing. This information is not intended to replace advice given to you by your health care provider. Make sure you discuss any questions you have with your health care provider. Document Released: 09/01/2013 Document Revised: 04/15/2016 Document Reviewed: 03/02/2016 Elsevier Interactive Patient Education  2019 [employer].

## 2018-11-13 NOTE — H&P (Signed)
The H and P was reviewed and updated. The patient was examined.  No changes were found after exam.  The surgical eye was marked.  

## 2018-11-13 NOTE — Transfer of Care (Signed)
Immediate Anesthesia Transfer of Care Note  Patient: Wendy [name]  Procedure(s) Performed: CATARACT EXTRACTION PHACO AND INTRAOCULAR LENS PLACEMENT RIGHT EYE (Right )  Patient Location: PACU  Anesthesia Type:MAC  Level of Consciousness: awake, alert  and oriented  Airway & Oxygen Therapy: Patient Spontanous Breathing  Post-op Assessment: Report given to RN  Post vital signs: Reviewed and stable  Last Vitals:  Vitals Value Taken Time  BP    Temp    Pulse    Resp    SpO2      Last Pain:  Vitals:   11/13/18 0642  TempSrc: Oral  PainSc: 0-No pain      Patients Stated Pain Goal: 8 (29/56/21 3086)  Complications: No apparent anesthesia complications

## 2018-11-16 ENCOUNTER — Encounter (HOSPITAL_COMMUNITY): Payer: Self-pay | Admitting: Ophthalmology

## 2018-11-19 ENCOUNTER — Telehealth: Payer: Self-pay

## 2018-11-19 ENCOUNTER — Ambulatory Visit: Payer: Medicare Other | Admitting: Physician Assistant

## 2018-11-19 DIAGNOSIS — J029 Acute pharyngitis, unspecified: Secondary | ICD-10-CM | POA: Diagnosis not present

## 2018-11-19 DIAGNOSIS — R509 Fever, unspecified: Secondary | ICD-10-CM | POA: Diagnosis not present

## 2018-11-19 NOTE — Telephone Encounter (Signed)
Returned patient's call.  Patient stating she is having a cough and sore throat X 1 day.  Patient denies any fever. [facility]  Nurse advised and agreed with patient's plan.  No further needs at this time.

## 2018-11-20 ENCOUNTER — Other Ambulatory Visit: Payer: Self-pay | Admitting: Hematology and Oncology

## 2018-11-20 DIAGNOSIS — Z170 Estrogen receptor positive status [ER+]: Principal | ICD-10-CM

## 2018-11-20 DIAGNOSIS — C50412 Malignant neoplasm of upper-outer quadrant of left female breast: Secondary | ICD-10-CM

## 2018-11-30 ENCOUNTER — Other Ambulatory Visit: Payer: Self-pay | Admitting: Hematology and Oncology

## 2018-11-30 ENCOUNTER — Inpatient Hospital Stay: Payer: Medicare Other | Attending: Hematology and Oncology

## 2018-11-30 ENCOUNTER — Inpatient Hospital Stay (HOSPITAL_BASED_OUTPATIENT_CLINIC_OR_DEPARTMENT_OTHER): Payer: Medicare Other | Admitting: Hematology and Oncology

## 2018-11-30 ENCOUNTER — Telehealth: Payer: Self-pay | Admitting: Hematology and Oncology

## 2018-11-30 ENCOUNTER — Inpatient Hospital Stay: Payer: Medicare Other

## 2018-11-30 DIAGNOSIS — J069 Acute upper respiratory infection, unspecified: Secondary | ICD-10-CM | POA: Diagnosis not present

## 2018-11-30 DIAGNOSIS — C7800 Secondary malignant neoplasm of unspecified lung: Secondary | ICD-10-CM

## 2018-11-30 DIAGNOSIS — R634 Abnormal weight loss: Secondary | ICD-10-CM | POA: Diagnosis not present

## 2018-11-30 DIAGNOSIS — C787 Secondary malignant neoplasm of liver and intrahepatic bile duct: Secondary | ICD-10-CM | POA: Insufficient documentation

## 2018-11-30 DIAGNOSIS — Z79811 Long term (current) use of aromatase inhibitors: Secondary | ICD-10-CM

## 2018-11-30 DIAGNOSIS — Z923 Personal history of irradiation: Secondary | ICD-10-CM

## 2018-11-30 DIAGNOSIS — C50412 Malignant neoplasm of upper-outer quadrant of left female breast: Secondary | ICD-10-CM

## 2018-11-30 DIAGNOSIS — Z170 Estrogen receptor positive status [ER+]: Secondary | ICD-10-CM

## 2018-11-30 DIAGNOSIS — Z5111 Encounter for antineoplastic chemotherapy: Secondary | ICD-10-CM | POA: Diagnosis not present

## 2018-11-30 DIAGNOSIS — C7951 Secondary malignant neoplasm of bone: Secondary | ICD-10-CM

## 2018-11-30 DIAGNOSIS — C7802 Secondary malignant neoplasm of left lung: Secondary | ICD-10-CM | POA: Insufficient documentation

## 2018-11-30 DIAGNOSIS — C771 Secondary and unspecified malignant neoplasm of intrathoracic lymph nodes: Secondary | ICD-10-CM | POA: Diagnosis not present

## 2018-11-30 DIAGNOSIS — J910 Malignant pleural effusion: Secondary | ICD-10-CM | POA: Insufficient documentation

## 2018-11-30 DIAGNOSIS — C7801 Secondary malignant neoplasm of right lung: Secondary | ICD-10-CM | POA: Diagnosis not present

## 2018-11-30 LAB — CMP (CANCER CENTER ONLY)
ALT: 12 U/L (ref 0–44)
AST: 18 U/L (ref 15–41)
Albumin: 3.3 g/dL — ABNORMAL LOW (ref 3.5–5.0)
Alkaline Phosphatase: 52 U/L (ref 38–126)
Anion gap: 11 (ref 5–15)
BUN: 14 mg/dL (ref 8–23)
CO2: 23 mmol/L (ref 22–32)
Calcium: 9.6 mg/dL (ref 8.9–10.3)
Chloride: 108 mmol/L (ref 98–111)
Creatinine: 0.99 mg/dL (ref 0.44–1.00)
GFR, Est AFR Am: >60 mL/min (ref >60)
GFR, Estimated: 55 mL/min — ABNORMAL LOW (ref >60)
Glucose, Bld: 82 mg/dL (ref 70–99)
Potassium: 3.6 mmol/L (ref 3.5–5.1)
Sodium: 142 mmol/L (ref 135–145)
Total Bilirubin: 0.4 mg/dL (ref 0.3–1.2)
Total Protein: 7.1 g/dL (ref 6.5–8.1)

## 2018-11-30 LAB — CBC WITH DIFFERENTIAL (CANCER CENTER ONLY)
Abs Immature Granulocytes: 0.02 10*3/uL (ref 0.00–0.07)
Basophils Absolute: 0 10*3/uL (ref 0.0–0.1)
Basophils Relative: 1 %
Eosinophils Absolute: 0.1 10*3/uL (ref 0.0–0.5)
Eosinophils Relative: 2 %
HCT: 32.1 % — ABNORMAL LOW (ref 36.0–46.0)
Hemoglobin: 10.4 g/dL — ABNORMAL LOW (ref 12.0–15.0)
Immature Granulocytes: 1 %
Lymphocytes Relative: 17 %
Lymphs Abs: 0.5 10*3/uL — ABNORMAL LOW (ref 0.7–4.0)
MCH: 32.6 pg (ref 26.0–34.0)
MCHC: 32.4 g/dL (ref 30.0–36.0)
MCV: 100.6 fL — ABNORMAL HIGH (ref 80.0–100.0)
Monocytes Absolute: 0.2 10*3/uL (ref 0.1–1.0)
Monocytes Relative: 8 %
Neutro Abs: 2.1 10*3/uL (ref 1.7–7.7)
Neutrophils Relative %: 71 %
Platelet Count: 262 10*3/uL (ref 150–400)
RBC: 3.19 MIL/uL — ABNORMAL LOW (ref 3.87–5.11)
RDW: 15.4 % (ref 11.5–15.5)
WBC Count: 2.9 10*3/uL — ABNORMAL LOW (ref 4.0–10.5)
nRBC: 0 % (ref 0.0–0.2)

## 2018-11-30 MED ORDER — DENOSUMAB 120 MG/1.7ML ~~LOC~~ SOLN
120.0000 mg | Freq: Once | SUBCUTANEOUS | Status: AC
  Administered 2018-11-30: 120 mg via SUBCUTANEOUS

## 2018-11-30 MED ORDER — AZITHROMYCIN 250 MG PO TABS: ORAL_TABLET | ORAL | 0 refills | Status: DC

## 2018-11-30 MED ORDER — DEXAMETHASONE 1 MG PO TABS: 1.0000 mg | ORAL_TABLET | Freq: Every day | ORAL | 3 refills | Status: DC

## 2018-11-30 MED ORDER — FULVESTRANT 250 MG/5ML IM SOLN
500.0000 mg | Freq: Once | INTRAMUSCULAR | Status: AC
  Administered 2018-11-30: 500 mg via INTRAMUSCULAR

## 2018-11-30 MED ORDER — FULVESTRANT 250 MG/5ML IM SOLN
INTRAMUSCULAR | Status: AC
  Filled 2018-11-30: qty 10

## 2018-11-30 MED ORDER — DENOSUMAB 120 MG/1.7ML ~~LOC~~ SOLN
SUBCUTANEOUS | Status: AC
  Filled 2018-11-30: qty 1.7

## 2018-11-30 NOTE — Telephone Encounter (Signed)
Gave avs and calendar also contrast. Per nurse schedule MD following week he is out

## 2018-11-30 NOTE — Progress Notes (Signed)
Pt stats that she has not had the xgeva injection since October of last year.

## 2018-11-30 NOTE — Progress Notes (Signed)
Patient Care Team: [name], [name] [facility] as PCP - General (Family Medicine)  DIAGNOSIS:  Encounter Diagnosis  Name Primary?  . 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 [name], T3N2 (5/10 LN positive) stage IIIa IDC grade 1, ER/PR 100%, TSH 14% HER-2 negative PET/CT 12/20/2013 negative for metastases    12/17/2013 - 04/02/2014 Chemotherapy    Taxotere Cytoxan every 3 weeks for 6 cycles    05/11/2014 - 06/27/2014 Radiation Therapy    Adjuvant radiation therapy to chest wall and lymph nodes    07/06/2014 -  Anti-estrogen oral therapy    Arimidex 1 mg daily    12/21/2017 Imaging    Metastatic lesions noted in the lungs bilaterally, bilateral hilar and mediastinal lymph nodes, left pleural effusion, multiple thoracic vertebral metastases, heterogeneous lesions in the liver    12/22/2017 Procedure    Pleural Effusion Thoracentesis: Metastatic Breast cancer ER PR Positive Her 2 Neg    12/29/2017 Treatment Plan Change    Ibrance, Faslodex and Xgeva     CHIEF COMPLIANT: Follow-up on Ibrance and Faslodex with Xgeva  INTERVAL HISTORY: Wendy [name] is a 78 year old with above-mentioned some metastatic breast cancer who had pleural effusion and has been on Ibrance for almost a year.  She continues to have chronic problems with regards to weight loss and loss of appetite and fatigue.  She  had scans in December which showed slight progression of hepatic metastases but stable bone disease.  We elected to continue with the same treatment.  She is here for 1 month follow-up.  REVIEW OF SYSTEMS:   Constitutional: Denies fevers, chills or abnormal weight loss Eyes: Denies blurriness of vision Ears, nose, mouth, throat, and face: Denies mucositis or sore throat Respiratory: Denies cough, dyspnea or wheezes Cardiovascular: Denies palpitation, chest discomfort Gastrointestinal:  Denies nausea, heartburn or change in bowel habits Skin: Denies abnormal skin rashes Lymphatics: Denies new lymphadenopathy or easy bruising Neurological:Denies numbness, tingling or new weaknesses Behavioral/Psych: Mood is stable, no new changes  Extremities: No lower extremity edema All other systems were reviewed with the patient and are negative.  I have reviewed the past medical history, past surgical history, social history and family history with the patient and they are unchanged from previous note.  ALLERGIES:  has No Known Allergies.  MEDICATIONS:  Current Outpatient Medications  Medication Sig Dispense Refill  . acetaminophen (TYLENOL) 500 MG tablet Take 1,000 mg by mouth every 6 (six) hours as needed for mild pain.    [name] aspirin EC 81 MG tablet Take 81 mg by mouth daily.    [name] atorvastatin (LIPITOR) 40 MG tablet Take 20 mg by mouth at bedtime.     [name] azithromycin (ZITHROMAX Z-PAK) 250 MG tablet Use as directed 6 each 0  . Calcium Carb-Cholecalciferol (CALCIUM 600 + D PO) Take 1 tablet by mouth daily.    [name]  dexamethasone (DECADRON) 1 MG tablet Take 1 tablet (1 mg total) by mouth daily. 30 tablet 3  . hydrochlorothiazide (MICROZIDE) 12.5 MG capsule Take 1 capsule (12.5 mg total) by mouth daily. 90 capsule 3  . IBRANCE 100 MG capsule TAKE 1 CAPSULE (100 MG TOTAL) BY MOUTH DAILY WITH BREAKFAST. TAKE FOR 21 DAYS ON, 7 DAYS OFF. TAKE WHOLE WITH FOOD. 21 capsule 3  . Multiple Vitamin (MULTIVITAMIN WITH  MINERALS) TABS tablet Take 1 tablet by mouth daily.    . nitroGLYCERIN (NITROSTAT) 0.4 MG SL tablet PLACE 1 TABLET (0.4 MG TOTAL) UNDER THE TONGUE EVERY 5 (FIVE) MINUTES AS NEEDED FOR CHEST PAIN. 25 tablet 0  . ondansetron (ZOFRAN ODT) 8 MG disintegrating tablet Take 1 tablet (8 mg total) by mouth every 8 (eight) hours as needed for nausea or vomiting. 30 tablet 6  . oxyCODONE-acetaminophen (PERCOCET/ROXICET) 5-325 MG tablet Take 1 tablet by mouth every 8 (eight) hours as needed for severe pain. 30 tablet 0  . promethazine (PHENERGAN) 25 MG tablet Take 1 tablet (25 mg total) by mouth every 8 (eight) hours as needed for nausea. 30 tablet 3   No current facility-administered medications for this visit.     PHYSICAL EXAMINATION: ECOG PERFORMANCE STATUS: 1 - Symptomatic but completely ambulatory  Vitals:   11/30/18 0858  BP: 136/64  Pulse: 85  Resp: 18  Temp: 97.6 F (36.4 C)  SpO2: 100%   There were no vitals filed for this visit.  GENERAL:alert, no distress and comfortable SKIN: skin color, texture, turgor are normal, no rashes or significant lesions EYES: normal, Conjunctiva are pink and non-injected, sclera clear OROPHARYNX:no exudate, no erythema and lips, buccal mucosa, and tongue normal  NECK: supple, thyroid normal size, non-tender, without nodularity LYMPH:  no palpable lymphadenopathy in the cervical, axillary or inguinal LUNGS: clear to auscultation and percussion with normal breathing effort HEART: regular rate & rhythm and no murmurs and no lower extremity edema ABDOMEN:abdomen soft, non-tender and normal bowel sounds MUSCULOSKELETAL:no cyanosis of digits and no clubbing  NEURO: alert & oriented x 3 with fluent speech, no focal motor/sensory deficits EXTREMITIES: No lower extremity edema   LABORATORY DATA:  I have reviewed the data as listed CMP Latest Ref Rng & Units 11/02/2018 10/07/2018 09/07/2018  Glucose 70 - 99 mg/dL 87 90 102(H)  BUN 8 - 23 mg/dL _0 Creatinine 0.44 - 1.00 mg/dL 0.91 0.89 0.92  Sodium 135 - 145 mmol/L 142 141 143  Potassium 3.5 - 5.1 mmol/L 3.6 3.4(L) 3.3(L)  Chloride 98 - 111 mmol/L 108 107 108  CO2 22 - 32 mmol/L _1 Calcium 8.9 - 10.3 mg/dL 9.3 8.9 9.5  Total Protein 6.5 - 8.1 g/dL 6.9 6.6 7.0  Total Bilirubin 0.3 - 1.2 mg/dL 0.5 0.3 0.6  Alkaline Phos 38 - 126 U/L 55 63 44  AST 15 - 41 U/L _2 ALT 0 - 44 U/L _3 Lab Results  Component Value Date   WBC 2.9 (L) 11/30/2018   HGB 10.4 (L) 11/30/2018   HCT 32.1 (L) 11/30/2018   MCV 100.6 (H) 11/30/2018   PLT 262 11/30/2018   NEUTROABS PENDING 11/30/2018    ASSESSMENT & PLAN:  Malignant neoplasm of upper-outer quadrant of left breast in female, estrogen receptor positive ([facility]) Left breast cancer T3, N2, M0 stage IIIa invasive ductal carcinoma ER 100% PR 100% gases on 40% HER-2 negative status post left mastectomy and adjuvant  chemotherapy with Taxotere Cytoxan x6 cycles. She had radiation therapy and has been on antiestrogen therapy with Arimidex 06/28/2014-January 2019  Thoracentesis: Met Breast cancer ER PR Positive and Her 2 Neg PET/CT scan. 01/04/18: New malignant Pleural effusion with small bil pulm nodules,new mild bilateral mediastinal hilar portocaval lymph node metastases, new liver metastases, new diffuse bone metastases. --------------------------------------------------------------------------- Current treatment: Ibrance with Faslodexstarted 12/29/2017 Ibrance toxicities: Fatigue Shortness of breath to exertion: Possible due to CHF  Weight loss:Zofran will be scheduled every 8 hours.  Megace was not working.  We will discontinue Megace and I sent a prescription for 1 mg of Decadron   Faslodex toxicities: Joint and muscle aches.  CT chest abdomen pelvis 10/30/2018: Hepatic metastases minimal progression, bone metastases unchanged. [facility] molecular testing  Return to clinic monthly for Faslodex injections and every 3  months for follow-up with me with labs.  CT scan of the abdomen will be obtained in 3 months prior to that visit. Cataract surgery 11/13/2018  Upper respiratory tract infection: I sent a prescription for azithromycin. Return to clinic monthly for Faslodex and every 3 months for follow-up with me  Orders Placed This Encounter  Procedures  . CT Chest W Contrast    Standing Status:   Future    Standing Expiration Date:   11/30/2019    Order Specific Question:   ** REASON FOR EXAM (FREE TEXT)    Answer:   Metastatic breast cancer restaging    Order Specific Question:   If indicated for the ordered procedure, I authorize the administration of contrast media per Radiology protocol    Answer:   Yes    Order Specific Question:   Preferred imaging location?    Answer:   [facility]    Order Specific Question:   Radiology Contrast Protocol - do NOT remove file path    Answer:   [email or web address]  . CT Abdomen Pelvis W Contrast    Standing Status:   Future    Standing Expiration Date:   11/30/2019    Order Specific Question:   ** REASON FOR EXAM (FREE TEXT)    Answer:   Met Breast cancer restaging    Order Specific Question:   If indicated for the ordered procedure, I authorize the administration of contrast media per Radiology protocol    Answer:   Yes    Order Specific Question:   Preferred imaging location?    Answer:   [facility]    Order Specific Question:   Is Oral Contrast requested for this exam?    Answer:   Yes, Per Radiology protocol    Order Specific Question:   Radiology Contrast Protocol - do NOT remove file path    Answer:   [email or web address]   The patient has a good understanding of the overall plan. she agrees with it. she will call with any problems that may develop before the next visit here.   Harriette Ohara, MD 11/30/18

## 2018-11-30 NOTE — Patient Instructions (Signed)

## 2018-11-30 NOTE — Assessment & Plan Note (Signed)
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 She had radiation therapy and has been on antiestrogen therapy with Arimidex 06/28/2014-January 2019  Thoracentesis: Met Breast cancer ER PR Positive and Her 2 Neg PET/CT scan. 01/04/18: New malignant Pleural effusion with small bil pulm nodules,new mild bilateral mediastinal hilar portocaval lymph node metastases, new liver metastases, new diffuse bone metastases. --------------------------------------------------------------------------- Current treatment: Ibrance with Faslodexstarted 12/29/2017 Ibrance toxicities: Fatigue Shortness of breath to exertion: Possible due to CHF  Weight loss:Zofran will be scheduled every 8 hours.Patient is currently on Megace. She has not lost any further weight.  Faslodex toxicities: Joint and muscle aches.  CT chest abdomen pelvis 10/30/2018: Hepatic metastases minimal progression, bone metastases unchanged. Caris molecular testing  Return to clinic monthly for Faslodex injections and every 3 months for follow-up with me with labs.  CT scan of the abdomen will be obtained in 3 months prior to that visit. Cataract surgery 11/13/2018

## 2018-12-01 ENCOUNTER — Encounter: Payer: Self-pay | Admitting: Physician Assistant

## 2018-12-01 ENCOUNTER — Ambulatory Visit (INDEPENDENT_AMBULATORY_CARE_PROVIDER_SITE_OTHER): Payer: Medicare Other | Admitting: Physician Assistant

## 2018-12-01 ENCOUNTER — Other Ambulatory Visit: Payer: Self-pay

## 2018-12-01 VITALS — BP 128/60 | HR 92 | Temp 97.9°F | Resp 14 | Ht 68.0 in | Wt 138.0 lb

## 2018-12-01 DIAGNOSIS — D696 Thrombocytopenia, unspecified: Secondary | ICD-10-CM | POA: Insufficient documentation

## 2018-12-01 DIAGNOSIS — C7951 Secondary malignant neoplasm of bone: Secondary | ICD-10-CM

## 2018-12-01 DIAGNOSIS — I10 Essential (primary) hypertension: Secondary | ICD-10-CM | POA: Diagnosis not present

## 2018-12-01 DIAGNOSIS — E785 Hyperlipidemia, unspecified: Secondary | ICD-10-CM | POA: Diagnosis not present

## 2018-12-01 MED FILL — IBRANCE 100 MG CAPSULE: 100 | 28 days supply | Qty: 21 | Fill #0

## 2018-12-01 NOTE — Patient Instructions (Signed)
I have placed orders for you to get your cholesterol drawn at the oncologist office.   Please complete the entire course of the Azithromycin and Decadron as directed by your specialist.   Please schedule a medicare wellness visit at your earliest convenience.

## 2018-12-01 NOTE — Progress Notes (Signed)
Patient presents to clinic today to establish care.  Acute Concerns: Patient denies acute concerns today.  Chronic Issues: Patient with history of metastatic breast cancer with mets to the liver, lungs and brain. Is followed by Oncology (Dr. [name]), currently on a treatment regimen of Ibrance and Faslodex. Has recently had scan in December showing slight progression of hepatic metastases. But stability of bone disease. Has follow-up once monthly with provider with lab work.   Hyperlipidemia -- Taking 20 mg Atorvastatin nightly. Is overdue for Lipid check. Wants to know if she can have checked at [facility]. Is followed by Cardiology with Carotid US scheduled later this month.   Health Maintenance: Immunizations -- Will need to obtain records for review. Mammogram -- up-to-date.  Bone Density -- Managed by Oncology.   Past Medical History:  Diagnosis Date  . Arthritis   . Breast cancer    left breast  . CAD (coronary artery disease)   . Hyperlipidemia   . Hypertension   . Myocardial infarction ([place]) 11/22/04  . Peripheral vascular disease ([place])    atherosclerosis carotid artery-mild    Past Surgical History:  Procedure Laterality Date  . ABDOMINAL HYSTERECTOMY  1982  . CATARACT EXTRACTION W/PHACO Right 11/13/2018   Procedure: CATARACT EXTRACTION PHACO AND INTRAOCULAR LENS PLACEMENT RIGHT EYE;  Surgeon: Baruch Goldmann, MD;  Location: [facility];  Service: Ophthalmology;  Laterality: Right;  right  . KNEE ARTHROSCOPY Left    knee  . MASTECTOMY MODIFIED RADICAL Left 11/12/2013   Procedure: MASTECTOMY MODIFIED RADICAL;  Surgeon: Rolm Bookbinder, MD;  Location: [facility];  Service: General;  Laterality: Left;  . 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 PERCUTANEOUS CORONARY STENT INTERVENTION (PCI-S)  2005  . PORTACATH PLACEMENT Right 12/16/2013   Procedure: INSERTION PORT-A-CATH;  Surgeon: Rolm Bookbinder, MD;  Location: [facility];  Service: General;   Laterality: Right;  . TEE WITHOUT CARDIOVERSION N/A 09/26/2017   Procedure: TRANSESOPHAGEAL ECHOCARDIOGRAM (TEE);  Surgeon: Dorothy Spark, MD;  Location: [facility] ENDOSCOPY;  Service: Cardiovascular;  Laterality: N/A;    Current Outpatient Medications on File Prior to Visit  Medication Sig Dispense Refill  . acetaminophen (TYLENOL) 500 MG tablet Take 1,000 mg by mouth every 6 (six) hours as needed for mild pain.    [name] aspirin EC 81 MG tablet Take 81 mg by mouth daily.    [name] atorvastatin (LIPITOR) 40 MG tablet Take 20 mg by mouth at bedtime.     [name] azithromycin (ZITHROMAX Z-PAK) 250 MG tablet Use as directed 6 each 0  . Calcium Carb-Cholecalciferol (CALCIUM 600 + D PO) Take 1 tablet by mouth daily.    [name] dexamethasone (DECADRON) 1 MG tablet Take 1 tablet (1 mg total) by mouth daily. 30 tablet 3  . fulvestrant (FASLODEX) 250 MG/5ML injection Inject into the muscle once. One injection each buttock over 1-2 minutes. Warm prior to use.    . hydrochlorothiazide (MICROZIDE) 12.5 MG capsule Take 1 capsule (12.5 mg total) by mouth daily. 90 capsule 3  . IBRANCE 100 MG capsule TAKE 1 CAPSULE (100 MG TOTAL) BY MOUTH DAILY WITH BREAKFAST. TAKE FOR 21 DAYS ON, 7 DAYS OFF. TAKE WHOLE WITH FOOD. 21 capsule 3  . Multiple Vitamin (MULTIVITAMIN WITH MINERALS) TABS tablet Take 1 tablet by mouth daily.    . nitroGLYCERIN (NITROSTAT) 0.4 MG SL tablet PLACE 1 TABLET (0.4 MG TOTAL) UNDER THE TONGUE EVERY 5 (FIVE) MINUTES AS NEEDED FOR CHEST PAIN. 25 tablet 0  .  ondansetron (ZOFRAN ODT) 8 MG disintegrating tablet Take 1 tablet (8 mg total) by mouth every 8 (eight) hours as needed for nausea or vomiting. 30 tablet 6  . oxyCODONE-acetaminophen (PERCOCET/ROXICET) 5-325 MG tablet Take 1 tablet by mouth every 8 (eight) hours as needed for severe pain. 30 tablet 0  . promethazine (PHENERGAN) 25 MG tablet Take 1 tablet (25 mg total) by mouth every 8 (eight) hours as needed for nausea. 30 tablet 3   No current facility-administered  medications on file prior to visit.     No Known Allergies  Family History  Problem Relation Age of Onset  . Emphysema Mother   . Heart attack Father     Social History   Socioeconomic History  . Marital status: Divorced    Spouse name: Not on file  . Number of children: 3  . Years of education: Not on file  . Highest education level: Not on file  Occupational History    Employer: [employer]  . Financial resource strain: Not on file  . Food insecurity:    Worry: Not on file    Inability: Not on file  . Transportation needs:    Medical: Not on file    Non-medical: Not on file  Tobacco Use  . Smoking status: Former Smoker    Packs/day: 0.30    Types: Cigarettes    Last attempt to quit: 11/09/1983    Years since quitting: 35.0  . Smokeless tobacco: Never Used  Substance and Sexual Activity  . Alcohol use: Not Currently  . Drug use: No  . Sexual activity: Not Currently  Lifestyle  . Physical activity:    Days per week: Not on file    Minutes per session: Not on file  . Stress: Not on file  Relationships  . Social connections:    Talks on phone: Not on file    Gets together: Not on file    Attends religious service: Not on file    Active member of club or organization: Not on file    Attends meetings of clubs or organizations: Not on file    Relationship status: Not on file  . Intimate partner violence:    Fear of current or ex partner: Not on file    Emotionally abused: Not on file    Physically abused: Not on file    Forced sexual activity: Not on file  Other Topics Concern  . Not on file  Social History Narrative  . Not on file    Review of Systems  Constitutional: Positive for malaise/fatigue. Negative for chills, fever and weight loss.  Eyes: Negative for blurred vision and double vision.  Cardiovascular: Negative for chest pain and palpitations.  Neurological: Negative for dizziness and loss of consciousness.  Psychiatric/Behavioral:  Negative for depression, hallucinations, substance abuse and suicidal ideas. The patient is not nervous/anxious and does not have insomnia.     BP 128/60   Pulse 92   Temp 97.9 F (36.6 C) (Oral)   Resp 14   Ht 5' 8"  (1.727 m)   Wt 138 lb (62.6 kg)   SpO2 98%   BMI 20.98 kg/m   Physical Exam Vitals signs reviewed.  Constitutional:      Appearance: Normal appearance.  HENT:     Head: Normocephalic and atraumatic.     Right Ear: Tympanic membrane normal.     Left Ear: Tympanic membrane normal.  Cardiovascular:     Rate and Rhythm: Normal rate  and regular rhythm.     Pulses: Normal pulses.     Heart sounds: Normal heart sounds.  Neurological:     General: No focal deficit present.     Mental Status: She is alert and oriented to person, place, and time.  Psychiatric:        Mood and Affect: Mood normal.     Recent Results (from the past 2160 hour(s))  CBC with Differential ([facility] Only)     Status: Abnormal   Collection Time: 09/07/18  9:26 AM  Result Value Ref Range   WBC Count 1.9 (L) 4.0 - 10.5 K/uL   RBC 3.19 (L) 3.87 - 5.11 MIL/uL   Hemoglobin 10.3 (L) 12.0 - 15.0 g/dL   HCT 31.2 (L) 36.0 - 46.0 %   MCV 97.8 80.0 - 100.0 fL   MCH 32.3 26.0 - 34.0 pg   MCHC 33.0 30.0 - 36.0 g/dL   RDW 15.9 (H) 11.5 - 15.5 %   Platelet Count 166 150 - 400 K/uL   nRBC 0.0 0.0 - 0.2 %   Neutrophils Relative % 53 %   Neutro Abs 1.0 (L) 1.7 - 7.7 K/uL   Lymphocytes Relative 32 %   Lymphs Abs 0.6 (L) 0.7 - 4.0 K/uL   Monocytes Relative 12 %   Monocytes Absolute 0.2 0.1 - 1.0 K/uL   Eosinophils Relative 1 %   Eosinophils Absolute 0.0 0.0 - 0.5 K/uL   Basophils Relative 2 %   Basophils Absolute 0.0 0.0 - 0.1 K/uL   Immature Granulocytes 0 %   Abs Immature Granulocytes 0.00 0.00 - 0.07 K/uL    Comment: Performed at [facility], [address], [place], [place] [address]  Comprehensive metabolic panel     Status: Abnormal   Collection Time: 09/07/18   9:26 AM  Result Value Ref Range   Sodium 143 135 - 145 mmol/L   Potassium 3.3 (L) 3.5 - 5.1 mmol/L   Chloride 108 98 - 111 mmol/L   CO2 25 22 - 32 mmol/L   Glucose, Bld 102 (H) 70 - 99 mg/dL   BUN 19 8 - 23 mg/dL   Creatinine, Ser 0.92 0.44 - 1.00 mg/dL   Calcium 9.5 8.9 - 10.3 mg/dL   Total Protein 7.0 6.5 - 8.1 g/dL   Albumin 3.8 3.5 - 5.0 g/dL   AST 20 15 - 41 U/L   ALT 13 0 - 44 U/L   Alkaline Phosphatase 44 38 - 126 U/L   Total Bilirubin 0.6 0.3 - 1.2 mg/dL   GFR calc non Af Amer 59 (L) >60 mL/min   GFR calc Af Amer >60 >60 mL/min    Comment: (NOTE) The eGFR has been calculated using the CKD EPI equation. This calculation has not been validated in all clinical situations. eGFR's persistently <60 mL/min signify possible Chronic Kidney Disease.    Anion gap 10 5 - 15    Comment: Performed at [facility], [place] [address], [place], [place] [address]  CMP ([place] only)     Status: Abnormal   Collection Time: 10/07/18  1:46 PM  Result Value Ref Range   Sodium 141 135 - 145 mmol/L   Potassium 3.4 (L) 3.5 - 5.1 mmol/L   Chloride 107 98 - 111 mmol/L   CO2 25 22 - 32 mmol/L   Glucose, Bld 90 70 - 99 mg/dL   BUN 13 8 - 23 mg/dL   Creatinine 0.89 0.44 - 1.00  mg/dL   Calcium 8.9 8.9 - 10.3 mg/dL   Total Protein 6.6 6.5 - 8.1 g/dL   Albumin 3.4 (L) 3.5 - 5.0 g/dL   AST 20 15 - 41 U/L   ALT 11 0 - 44 U/L   Alkaline Phosphatase 63 38 - 126 U/L   Total Bilirubin 0.3 0.3 - 1.2 mg/dL   GFR, Est Non Af Am >60 >60 mL/min   GFR, Est AFR Am >60 >60 mL/min    Comment: (NOTE) The eGFR has been calculated using the CKD EPI equation. This calculation has not been validated in all clinical situations. eGFR's persistently <60 mL/min signify possible Chronic Kidney Disease.    Anion gap 9 5 - 15    Comment: Performed at [facility], [address], [place], [place] [address]  CBC with Differential ([place] Only)     Status:  Abnormal   Collection Time: 10/07/18  1:46 PM  Result Value Ref Range   WBC Count 3.0 (L) 4.0 - 10.5 K/uL   RBC 2.99 (L) 3.87 - 5.11 MIL/uL   Hemoglobin 9.8 (L) 12.0 - 15.0 g/dL   HCT 30.2 (L) 36.0 - 46.0 %   MCV 101.0 (H) 80.0 - 100.0 fL   MCH 32.8 26.0 - 34.0 pg   MCHC 32.5 30.0 - 36.0 g/dL   RDW 17.0 (H) 11.5 - 15.5 %   Platelet Count 192 150 - 400 K/uL   nRBC 0.0 0.0 - 0.2 %   Neutrophils Relative % 51 %   Neutro Abs 1.5 (L) 1.7 - 7.7 K/uL   Lymphocytes Relative 27 %   Lymphs Abs 0.8 0.7 - 4.0 K/uL   Monocytes Relative 20 %   Monocytes Absolute 0.6 0.1 - 1.0 K/uL   Eosinophils Relative 1 %   Eosinophils Absolute 0.0 0.0 - 0.5 K/uL   Basophils Relative 1 %   Basophils Absolute 0.0 0.0 - 0.1 K/uL   Immature Granulocytes 0 %   Abs Immature Granulocytes 0.01 0.00 - 0.07 K/uL    Comment: Performed at [facility], [place] [address] [place], [place] [address]  CBC with Differential ([place] Only)     Status: Abnormal   Collection Time: 11/02/18  9:08 AM  Result Value Ref Range   WBC Count 2.0 (L) 4.0 - 10.5 K/uL   RBC 3.15 (L) 3.87 - 5.11 MIL/uL   Hemoglobin 10.4 (L) 12.0 - 15.0 g/dL   HCT 32.0 (L) 36.0 - 46.0 %   MCV 101.6 (H) 80.0 - 100.0 fL   MCH 33.0 26.0 - 34.0 pg   MCHC 32.5 30.0 - 36.0 g/dL   RDW 16.5 (H) 11.5 - 15.5 %   Platelet Count 153 150 - 400 K/uL   nRBC 0.0 0.0 - 0.2 %   Neutrophils Relative % 58 %   Neutro Abs 1.2 (L) 1.7 - 7.7 K/uL   Lymphocytes Relative 24 %   Lymphs Abs 0.5 (L) 0.7 - 4.0 K/uL   Monocytes Relative 13 %   Monocytes Absolute 0.3 0.1 - 1.0 K/uL   Eosinophils Relative 2 %   Eosinophils Absolute 0.0 0.0 - 0.5 K/uL   Basophils Relative 2 %   Basophils Absolute 0.0 0.0 - 0.1 K/uL   Immature Granulocytes 1 %   Abs Immature Granulocytes 0.01 0.00 - 0.07 K/uL   Ovalocytes PRESENT     Comment: Performed at [facility], [place] [address], [place], [place] [address]  CMP (  [place] only)      Status: None   Collection Time: 11/02/18  9:08 AM  Result Value Ref Range   Sodium 142 135 - 145 mmol/L   Potassium 3.6 3.5 - 5.1 mmol/L   Chloride 108 98 - 111 mmol/L   CO2 24 22 - 32 mmol/L   Glucose, Bld 87 70 - 99 mg/dL   BUN 15 8 - 23 mg/dL   Creatinine 0.91 0.44 - 1.00 mg/dL   Calcium 9.3 8.9 - 10.3 mg/dL   Total Protein 6.9 6.5 - 8.1 g/dL   Albumin 3.5 3.5 - 5.0 g/dL   AST 19 15 - 41 U/L   ALT 10 0 - 44 U/L   Alkaline Phosphatase 55 38 - 126 U/L   Total Bilirubin 0.5 0.3 - 1.2 mg/dL   GFR, Est Non Af Am >60 >60 mL/min   GFR, Est AFR Am >60 >60 mL/min   Anion gap 10 5 - 15    Comment: Performed at [facility], [address] [place], [place] [address]  CBC     Status: Abnormal   Collection Time: 11/10/18  1:08 PM  Result Value Ref Range   WBC 2.3 (L) 4.0 - 10.5 K/uL   RBC 3.11 (L) 3.87 - 5.11 MIL/uL   Hemoglobin 10.2 (L) 12.0 - 15.0 g/dL   HCT 32.5 (L) 36.0 - 46.0 %   MCV 104.5 (H) 80.0 - 100.0 fL   MCH 32.8 26.0 - 34.0 pg   MCHC 31.4 30.0 - 36.0 g/dL   RDW 16.4 (H) 11.5 - 15.5 %   Platelets 270 150 - 400 K/uL   nRBC 0.0 0.0 - 0.2 %    Comment: Performed at [facility], [address], [place], [place] [address]  CBC with Differential ([place] Only)     Status: Abnormal   Collection Time: 11/30/18  8:46 AM  Result Value Ref Range   WBC Count 2.9 (L) 4.0 - 10.5 K/uL   RBC 3.19 (L) 3.87 - 5.11 MIL/uL   Hemoglobin 10.4 (L) 12.0 - 15.0 g/dL   HCT 32.1 (L) 36.0 - 46.0 %   MCV 100.6 (H) 80.0 - 100.0 fL   MCH 32.6 26.0 - 34.0 pg   MCHC 32.4 30.0 - 36.0 g/dL   RDW 15.4 11.5 - 15.5 %   Platelet Count 262 150 - 400 K/uL   nRBC 0.0 0.0 - 0.2 %   Neutrophils Relative % 71 %   Neutro Abs 2.1 1.7 - 7.7 K/uL   Lymphocytes Relative 17 %   Lymphs Abs 0.5 (L) 0.7 - 4.0 K/uL   Monocytes Relative 8 %   Monocytes Absolute 0.2 0.1 - 1.0 K/uL   Eosinophils Relative 2 %   Eosinophils Absolute 0.1 0.0 - 0.5 K/uL   Basophils Relative 1 %    Basophils Absolute 0.0 0.0 - 0.1 K/uL   Immature Granulocytes 1 %   Abs Immature Granulocytes 0.02 0.00 - 0.07 K/uL    Comment: Performed at [facility], [place] [address], [place],  [address]  CMP ([place] only)     Status: Abnormal   Collection Time: 11/30/18  8:46 AM  Result Value Ref Range   Sodium 142 135 - 145 mmol/L   Potassium 3.6 3.5 - 5.1 mmol/L   Chloride 108 98 - 111 mmol/L   CO2 23 22 - 32 mmol/L   Glucose, Bld 82 70 - 99 mg/dL   BUN 14  8 - 23 mg/dL   Creatinine 0.99 0.44 - 1.00 mg/dL   Calcium 9.6 8.9 - 10.3 mg/dL   Total Protein 7.1 6.5 - 8.1 g/dL   Albumin 3.3 (L) 3.5 - 5.0 g/dL   AST 18 15 - 41 U/L   ALT 12 0 - 44 U/L   Alkaline Phosphatase 52 38 - 126 U/L   Total Bilirubin 0.4 0.3 - 1.2 mg/dL   GFR, Est Non Af Am 55 (L) >60 mL/min   GFR, Est AFR Am >60 >60 mL/min   Anion gap 11 5 - 15    Comment: Performed at [facility], [place] [address], [place], [place] [address]   Assessment/Plan: 1. Bone metastases ([place]) Continue management by Oncology. Will get records to assess last DEXA scan.   2. Hyperlipidemia, unspecified hyperlipidemia type Future orders placed for lipids to be checked at next Oncology lab draw. Continue Atorvastatin as directed.  3. Essential hypertension BP stable. Continue current medication regimen. Follow-up for Carotid US as scheduled.    Leeanne Rio, [name]

## 2018-12-14 ENCOUNTER — Encounter (HOSPITAL_COMMUNITY): Payer: Medicare Other

## 2018-12-24 ENCOUNTER — Telehealth: Payer: Self-pay

## 2018-12-24 NOTE — Telephone Encounter (Signed)
Oral Oncology Patient Advocate Encounter  Received notification from [place] that the existing prior authorization for Wendy [name] is due for renewal.  Renewal PA submitted on CoverMyMeds Key [id number] Status is pending  Oral Oncology Clinic will continue to follow.  [place] Patient [place] Phone [phone] Fax [phone]

## 2018-12-24 NOTE — Telephone Encounter (Signed)
Oral Oncology Patient Advocate Encounter  Prior Authorization for Leslee Home has been approved.    PA# [id number] Effective dates: 12/24/18 through 12/25/19  Oral Oncology Clinic will continue to follow.   [place] Patient [place] Phone [phone] Fax [phone]

## 2018-12-31 ENCOUNTER — Inpatient Hospital Stay: Payer: Medicare Other | Attending: Hematology and Oncology

## 2018-12-31 VITALS — BP 120/59 | HR 79 | Temp 97.7°F | Resp 18

## 2018-12-31 DIAGNOSIS — Z170 Estrogen receptor positive status [ER+]: Secondary | ICD-10-CM | POA: Diagnosis not present

## 2018-12-31 DIAGNOSIS — C50412 Malignant neoplasm of upper-outer quadrant of left female breast: Secondary | ICD-10-CM | POA: Diagnosis not present

## 2018-12-31 DIAGNOSIS — Z5111 Encounter for antineoplastic chemotherapy: Secondary | ICD-10-CM | POA: Diagnosis not present

## 2018-12-31 DIAGNOSIS — C7800 Secondary malignant neoplasm of unspecified lung: Secondary | ICD-10-CM

## 2018-12-31 MED ORDER — FULVESTRANT 250 MG/5ML IM SOLN
500.0000 mg | Freq: Once | INTRAMUSCULAR | Status: AC
  Administered 2018-12-31: 500 mg via INTRAMUSCULAR

## 2018-12-31 MED ORDER — FULVESTRANT 250 MG/5ML IM SOLN
INTRAMUSCULAR | Status: AC
  Filled 2018-12-31: qty 5

## 2018-12-31 MED ORDER — DENOSUMAB 120 MG/1.7ML ~~LOC~~ SOLN
SUBCUTANEOUS | Status: AC
  Filled 2018-12-31: qty 1.7

## 2018-12-31 MED ORDER — DENOSUMAB 120 MG/1.7ML ~~LOC~~ SOLN: 120.0000 mg | Freq: Once | SUBCUTANEOUS | Status: DC

## 2018-12-31 MED FILL — IBRANCE 100 MG CAPSULE: 100 | 28 days supply | Qty: 21 | Fill #1

## 2018-12-31 NOTE — Patient Instructions (Signed)

## 2019-01-18 ENCOUNTER — Other Ambulatory Visit: Payer: Self-pay | Admitting: Hematology and Oncology

## 2019-01-29 ENCOUNTER — Inpatient Hospital Stay: Payer: Medicare Other | Attending: Hematology and Oncology

## 2019-01-29 DIAGNOSIS — C50412 Malignant neoplasm of upper-outer quadrant of left female breast: Secondary | ICD-10-CM | POA: Insufficient documentation

## 2019-01-29 DIAGNOSIS — C7800 Secondary malignant neoplasm of unspecified lung: Secondary | ICD-10-CM

## 2019-01-29 DIAGNOSIS — Z923 Personal history of irradiation: Secondary | ICD-10-CM | POA: Insufficient documentation

## 2019-01-29 DIAGNOSIS — Z170 Estrogen receptor positive status [ER+]: Secondary | ICD-10-CM | POA: Insufficient documentation

## 2019-01-29 DIAGNOSIS — Z5111 Encounter for antineoplastic chemotherapy: Secondary | ICD-10-CM | POA: Insufficient documentation

## 2019-01-29 DIAGNOSIS — C787 Secondary malignant neoplasm of liver and intrahepatic bile duct: Secondary | ICD-10-CM | POA: Insufficient documentation

## 2019-01-29 MED ORDER — FULVESTRANT 250 MG/5ML IM SOLN
500.0000 mg | Freq: Once | INTRAMUSCULAR | Status: AC
  Administered 2019-01-29: 500 mg via INTRAMUSCULAR

## 2019-01-29 MED ORDER — FULVESTRANT 250 MG/5ML IM SOLN
INTRAMUSCULAR | Status: AC
  Filled 2019-01-29: qty 10

## 2019-01-29 MED FILL — IBRANCE 100 MG CAPSULE: 100 | 28 days supply | Qty: 21 | Fill #2

## 2019-01-29 NOTE — Patient Instructions (Signed)

## 2019-02-09 ENCOUNTER — Encounter (HOSPITAL_COMMUNITY): Payer: Medicare Other

## 2019-02-25 MED FILL — IBRANCE 100 MG CAPSULE: 100 | 28 days supply | Qty: 21 | Fill #3

## 2019-03-01 ENCOUNTER — Other Ambulatory Visit: Payer: Self-pay

## 2019-03-01 ENCOUNTER — Ambulatory Visit (HOSPITAL_COMMUNITY)
Admission: RE | Admit: 2019-03-01 | Discharge: 2019-03-01 | Disposition: A | Discharge status: Home / Self Care | Payer: Medicare Other | Source: Ambulatory Visit | Attending: Hematology and Oncology | Admitting: Hematology and Oncology

## 2019-03-01 ENCOUNTER — Inpatient Hospital Stay: Payer: Medicare Other | Attending: Hematology and Oncology

## 2019-03-01 ENCOUNTER — Inpatient Hospital Stay: Payer: Medicare Other

## 2019-03-01 DIAGNOSIS — J90 Pleural effusion, not elsewhere classified: Secondary | ICD-10-CM | POA: Diagnosis not present

## 2019-03-01 DIAGNOSIS — C7951 Secondary malignant neoplasm of bone: Secondary | ICD-10-CM

## 2019-03-01 DIAGNOSIS — C787 Secondary malignant neoplasm of liver and intrahepatic bile duct: Secondary | ICD-10-CM | POA: Diagnosis not present

## 2019-03-01 DIAGNOSIS — Z7982 Long term (current) use of aspirin: Secondary | ICD-10-CM | POA: Diagnosis not present

## 2019-03-01 DIAGNOSIS — C50412 Malignant neoplasm of upper-outer quadrant of left female breast: Secondary | ICD-10-CM

## 2019-03-01 DIAGNOSIS — Z170 Estrogen receptor positive status [ER+]: Secondary | ICD-10-CM | POA: Insufficient documentation

## 2019-03-01 DIAGNOSIS — C7801 Secondary malignant neoplasm of right lung: Secondary | ICD-10-CM | POA: Diagnosis not present

## 2019-03-01 DIAGNOSIS — Z79811 Long term (current) use of aromatase inhibitors: Secondary | ICD-10-CM | POA: Insufficient documentation

## 2019-03-01 DIAGNOSIS — C7802 Secondary malignant neoplasm of left lung: Secondary | ICD-10-CM | POA: Insufficient documentation

## 2019-03-01 DIAGNOSIS — Z5111 Encounter for antineoplastic chemotherapy: Secondary | ICD-10-CM | POA: Diagnosis not present

## 2019-03-01 DIAGNOSIS — C7800 Secondary malignant neoplasm of unspecified lung: Secondary | ICD-10-CM

## 2019-03-01 DIAGNOSIS — Z79899 Other long term (current) drug therapy: Secondary | ICD-10-CM | POA: Insufficient documentation

## 2019-03-01 DIAGNOSIS — C3490 Malignant neoplasm of unspecified part of unspecified bronchus or lung: Secondary | ICD-10-CM | POA: Diagnosis not present

## 2019-03-01 LAB — CMP (CANCER CENTER ONLY)
ALT: 21 U/L (ref 0–44)
AST: 26 U/L (ref 15–41)
Albumin: 3.7 g/dL (ref 3.5–5.0)
Alkaline Phosphatase: 62 U/L (ref 38–126)
Anion gap: 11 (ref 5–15)
BUN: 24 mg/dL — ABNORMAL HIGH (ref 8–23)
CO2: 29 mmol/L (ref 22–32)
Calcium: 9.4 mg/dL (ref 8.9–10.3)
Chloride: 102 mmol/L (ref 98–111)
Creatinine: 0.97 mg/dL (ref 0.44–1.00)
GFR, Est AFR Am: >60 mL/min (ref >60)
GFR, Estimated: 56 mL/min — ABNORMAL LOW (ref >60)
Glucose, Bld: 83 mg/dL (ref 70–99)
Potassium: 3.7 mmol/L (ref 3.5–5.1)
Sodium: 142 mmol/L (ref 135–145)
Total Bilirubin: 0.4 mg/dL (ref 0.3–1.2)
Total Protein: 7.2 g/dL (ref 6.5–8.1)

## 2019-03-01 LAB — CBC WITH DIFFERENTIAL (CANCER CENTER ONLY)
Abs Immature Granulocytes: 0.05 10*3/uL (ref 0.00–0.07)
Basophils Absolute: 0.1 10*3/uL (ref 0.0–0.1)
Basophils Relative: 2 %
Eosinophils Absolute: 0.1 10*3/uL (ref 0.0–0.5)
Eosinophils Relative: 2 %
HCT: 39.9 % (ref 36.0–46.0)
Hemoglobin: 12.7 g/dL (ref 12.0–15.0)
Immature Granulocytes: 1 %
Lymphocytes Relative: 29 %
Lymphs Abs: 1 10*3/uL (ref 0.7–4.0)
MCH: 33.9 pg (ref 26.0–34.0)
MCHC: 31.8 g/dL (ref 30.0–36.0)
MCV: 106.4 fL — ABNORMAL HIGH (ref 80.0–100.0)
Monocytes Absolute: 0.5 10*3/uL (ref 0.1–1.0)
Monocytes Relative: 14 %
Neutro Abs: 1.8 10*3/uL (ref 1.7–7.7)
Neutrophils Relative %: 52 %
Platelet Count: 161 10*3/uL (ref 150–400)
RBC: 3.75 MIL/uL — ABNORMAL LOW (ref 3.87–5.11)
RDW: 16.1 % — ABNORMAL HIGH (ref 11.5–15.5)
WBC Count: 3.5 10*3/uL — ABNORMAL LOW (ref 4.0–10.5)
nRBC: 0 % (ref 0.0–0.2)

## 2019-03-01 MED ORDER — FULVESTRANT 250 MG/5ML IM SOLN
INTRAMUSCULAR | Status: AC
  Filled 2019-03-01: qty 10

## 2019-03-01 MED ORDER — SODIUM CHLORIDE (PF) 0.9 % IJ SOLN
INTRAMUSCULAR | Status: AC
  Filled 2019-03-01: qty 50

## 2019-03-01 MED ORDER — FULVESTRANT 250 MG/5ML IM SOLN
500.0000 mg | Freq: Once | INTRAMUSCULAR | Status: AC
  Administered 2019-03-01: 500 mg via INTRAMUSCULAR

## 2019-03-01 MED ORDER — DENOSUMAB 120 MG/1.7ML ~~LOC~~ SOLN
120.0000 mg | Freq: Once | SUBCUTANEOUS | Status: AC
  Administered 2019-03-01: 120 mg via SUBCUTANEOUS

## 2019-03-01 MED ORDER — IOHEXOL 300 MG/ML  SOLN
100.0000 mL | Freq: Once | INTRAMUSCULAR | Status: AC | PRN
  Administered 2019-03-01: 100 mL via INTRAVENOUS

## 2019-03-01 MED ORDER — DENOSUMAB 120 MG/1.7ML ~~LOC~~ SOLN
SUBCUTANEOUS | Status: AC
  Filled 2019-03-01: qty 1.7

## 2019-03-01 NOTE — Patient Instructions (Signed)

## 2019-03-02 NOTE — Assessment & Plan Note (Signed)
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 She had radiation therapy and has been on antiestrogen therapy with Arimidex 06/28/2014-January 2019  Thoracentesis: Met Breast cancer ER PR Positive and Her 2 Neg PET/CT scan. 01/04/18: New malignant Pleural effusion with small bil pulm nodules,new mild bilateral mediastinal hilar portocaval lymph node metastases, new liver metastases, new diffuse bone metastases. --------------------------------------------------------------------------- Current treatment: Ibrance with Faslodexstarted 12/29/2017 Ibrance toxicities: Fatigue Shortness of breath to exertion: Possible due to CHF  Weight loss:Zofran will be scheduled every 8 hours.  Megace was not working.  We will discontinue Megace and I sent a prescription for 1 mg of Decadron   Faslodex toxicities: Joint and muscle aches.  CT chest abdomen pelvis 10/30/2018: Hepatic metastases minimal progression, bone metastases unchanged. [facility] molecular testing  Return to clinic monthly for Faslodex injections and every 3 months for follow-up with me with labs.  CT scan of the abdomen will be obtained in 3 months prior to that visit. Cataract surgery 11/13/2018  Upper respiratory tract infection: I sent a prescription for azithromycin. Return to clinic monthly for Faslodex and every 3 months for follow-up with me

## 2019-03-08 NOTE — Progress Notes (Signed)
Patient Care Team: Delorse Limber as PCP - General (Family Medicine)  DIAGNOSIS:    ICD-10-CM   1. Hyperlipidemia, unspecified hyperlipidemia type E78.5 Lipid panel  2. 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 [name], T3N2 (5/10 LN positive) stage IIIa IDC grade 1, ER/PR 100%, TSH 14% HER-2 negative PET/CT 12/20/2013 negative for metastases    12/17/2013 - 04/02/2014 Chemotherapy    Taxotere Cytoxan every 3 weeks for 6 cycles    05/11/2014 - 06/27/2014 Radiation Therapy    Adjuvant radiation therapy to chest wall and lymph nodes    07/06/2014 -  Anti-estrogen oral therapy    Arimidex 1 mg daily    12/21/2017 Imaging    Metastatic lesions noted in the lungs bilaterally, bilateral hilar and mediastinal lymph nodes, left pleural effusion, multiple thoracic vertebral metastases, heterogeneous lesions in the liver    12/22/2017 Procedure    Pleural Effusion Thoracentesis: Metastatic Breast cancer ER PR Positive Her 2 Neg    12/29/2017 Treatment Plan Change    Ibrance, Faslodex and Xgeva     CHIEF COMPLIANT: Follow-up on Ibrance and Faslodex with Delton See  INTERVAL HISTORY: Wendy [name] is a 78 y.o. with above-mentioned history of metastatic breast cancer currently on treatment with Ibrance and Faslodex. A CT chest/abdomen/pelvis from  03/01/19 showed stable bone metastases and mild progression of liver metastases. She presents to the clinic today for treatment and to review recent labs.  She complains of fatigue due to [place].  She does complain of shortness of breath with exertion which is related to her heart failure..  Denies any fevers or chills.  She is also here to review results of recent CT scans.  REVIEW OF SYSTEMS:   Constitutional: Denies fevers, chills or abnormal weight loss, complains of fatigue Eyes: Denies blurriness of vision Ears, nose, mouth, throat, and face: Denies mucositis or sore throat Respiratory: Denies cough, dyspnea or wheezes Cardiovascular: Denies palpitation, chest discomfort Gastrointestinal: Denies nausea, heartburn or change in bowel habits Skin: Denies abnormal skin rashes Lymphatics: Denies new lymphadenopathy or easy bruising Neurological: Denies numbness, tingling or new weaknesses Behavioral/Psych: Mood is stable, no new changes  Extremities: No lower extremity edema Breast: denies any pain or lumps or nodules in either breasts All other systems were reviewed with the patient and are negative.  I have reviewed the past medical history, past surgical history, social history and family history with the patient and they are unchanged from previous note.  ALLERGIES:  has No Known Allergies.  MEDICATIONS:  Current Outpatient Medications  Medication Sig Dispense Refill  . acetaminophen (TYLENOL) 500 MG tablet Take 1,000 mg by mouth every 6 (six) hours as needed for mild pain.    [name] aspirin EC 81 MG tablet Take 81 mg by mouth daily.    [name] atorvastatin (  LIPITOR) 40 MG tablet Take 20 mg by mouth at bedtime.     . Calcium Carb-Cholecalciferol (CALCIUM 600 + D PO) Take 1 tablet by mouth daily.    [name] dexamethasone (DECADRON) 1 MG tablet Take 0.5 tablets (0.5 mg total) by mouth daily. 30 tablet 3  . everolimus (AFINITOR) 10 MG tablet Take 1 tablet (10 mg total) by mouth daily. 30 tablet 3  .  exemestane (AROMASIN) 25 MG tablet Take 1 tablet (25 mg total) by mouth daily after breakfast. 90 tablet 3  . hydrochlorothiazide (MICROZIDE) 12.5 MG capsule Take 1 capsule (12.5 mg total) by mouth daily. 90 capsule 3  . Multiple Vitamin (MULTIVITAMIN WITH MINERALS) TABS tablet Take 1 tablet by mouth daily.    . nitroGLYCERIN (NITROSTAT) 0.4 MG SL tablet PLACE 1 TABLET (0.4 MG TOTAL) UNDER THE TONGUE EVERY 5 (FIVE) MINUTES AS NEEDED FOR CHEST PAIN. 25 tablet 0  . ondansetron (ZOFRAN ODT) 8 MG disintegrating tablet Take 1 tablet (8 mg total) by mouth every 8 (eight) hours as needed for nausea or vomiting. 30 tablet 6  . oxyCODONE-acetaminophen (PERCOCET/ROXICET) 5-325 MG tablet Take 1 tablet by mouth every 8 (eight) hours as needed for severe pain. 30 tablet 0  . promethazine (PHENERGAN) 25 MG tablet TAKE 1 TABLET (25 MG TOTAL) BY MOUTH EVERY 8 (EIGHT) HOURS AS NEEDED FOR NAUSEA. 30 tablet 0   No current facility-administered medications for this visit.     PHYSICAL EXAMINATION: ECOG PERFORMANCE STATUS: 1 - Symptomatic but completely ambulatory  Vitals:   03/09/19 0847  BP: 133/62  Pulse: 75  Resp: 17  Temp: 97.9 F (36.6 C)  SpO2: 99%   Filed Weights   03/09/19 0847  Weight: 150 lb 14.4 oz (68.4 kg)    GENERAL: alert, no distress and comfortable SKIN: skin color, texture, turgor are normal, no rashes or significant lesions EYES: normal, Conjunctiva are pink and non-injected, sclera clear OROPHARYNX: no exudate, no erythema and lips, buccal mucosa, and tongue normal  NECK: supple, thyroid normal size, non-tender, without nodularity LYMPH: no palpable lymphadenopathy in the cervical, axillary or inguinal LUNGS: clear to auscultation and percussion with normal breathing effort HEART: regular rate & rhythm and no murmurs and no lower extremity edema ABDOMEN: abdomen soft, non-tender and normal bowel sounds MUSCULOSKELETAL: no cyanosis of digits and no clubbing  NEURO: alert &  oriented x 3 with fluent speech, no focal motor/sensory deficits EXTREMITIES: No lower extremity edema  LABORATORY DATA:  I have reviewed the data as listed CMP Latest Ref Rng & Units 03/01/2019 11/30/2018 11/02/2018  Glucose 70 - 99 mg/dL 83 82 87  BUN 8 - 23 mg/dL 24(H) 14 15  Creatinine 0.44 - 1.00 mg/dL 0.97 0.99 0.91  Sodium 135 - 145 mmol/L 142 142 142  Potassium 3.5 - 5.1 mmol/L 3.7 3.6 3.6  Chloride 98 - 111 mmol/L 102 108 108  CO2 22 - 32 mmol/L 29 23 24   Calcium 8.9 - 10.3 mg/dL 9.4 9.6 9.3  Total Protein 6.5 - 8.1 g/dL 7.2 7.1 6.9  Total Bilirubin 0.3 - 1.2 mg/dL 0.4 0.4 0.5  Alkaline Phos 38 - 126 U/L 62 52 55  AST 15 - 41 U/L 26 18 19   ALT 0 - 44 U/L 21 12 10     Lab Results  Component Value Date   WBC 3.5 (L) 03/01/2019   HGB 12.7 03/01/2019   HCT 39.9 03/01/2019   MCV 106.4 (H) 03/01/2019   PLT 161 03/01/2019   NEUTROABS 1.8  03/01/2019    ASSESSMENT & PLAN:  Malignant neoplasm of upper-outer quadrant of left breast in female, 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 She had radiation therapy and has been on antiestrogen therapy with Arimidex 06/28/2014-January 2019  Thoracentesis: Met Breast cancer ER PR Positive and Her 2 Neg PET/CT scan. 01/04/18: New malignant Pleural effusion with small bil pulm nodules,new mild bilateral mediastinal hilar portocaval lymph node metastases, new liver metastases, new diffuse bone metastases. --------------------------------------------------------------------------- Current treatment: Ibrance with Faslodexstarted 12/29/2017 Ibrance toxicities: Fatigue Shortness of breath to exertion: Left lung pleural thickening and mild pleural effusion  Weight loss:Zofran will be scheduled every 8 hours.  Megace was not working.  We will discontinue  Megace and I sent a prescription for 1 mg of Decadron   Faslodex toxicities: Joint and muscle aches.  CT chest abdomen pelvis 10/30/2018: Hepatic metastases minimal progression, bone metastases unchanged. [facility] molecular testing  Return to clinic monthly for Faslodex injections and every 3 months for follow-up with me with labs.   Cataract surgery 11/13/2018 CT CAP 03/01/2019: No substantial interval change in the chest.  Thoracic adenopathy similar with chronic left pleural effusion and left pleural thickening.  Continued mild progression of metastatic disease in the liver.  Conglomeration of lesions increased from 4.8 to 5.1 cm.,  Index subcapsular liver lesion increased from 2.7 to 3.6 cm.  Inferior right lesion 3.4 cm is now 4.6 cm.  Radiology review: We discussed different options for treatment.  Overall the increase in size is significant and we discussed different options including continuation of the current treatment and short interval scan, switching her to exemestane and everolimus versus chemotherapy.  Plan: Switch treatment to exemestane and everolimus. Counseled extensively about everolimus related toxicities including mucositis hyperglycemia and hyperlipidemia. I sent a prescription to the vascular pharmacy.  She will stop taking Ibrance as soon as she receives the new medication.  I would like to see her back first week of May for follow-up to review her blood work.    Orders Placed This Encounter  Procedures  . CBC w/Diff/Platelet  . CMP ([place] only)    Standing Status:   Future    Standing Expiration Date:   03/08/2020  . Lipid panel    Standing Status:   Future    Standing Expiration Date:   03/08/2020   The patient has a good understanding of the overall plan. she agrees with it. she will call with any problems that may develop before the next visit here.  [name] Lose, MD 03/09/2019  Julious Oka Dorshimer am acting as scribe for Dr. [name] Lose.  I have  reviewed the above documentation for accuracy and completeness, and I agree with the above.

## 2019-03-09 ENCOUNTER — Telehealth: Payer: Self-pay | Admitting: Pharmacist

## 2019-03-09 ENCOUNTER — Telehealth: Payer: Self-pay

## 2019-03-09 ENCOUNTER — Other Ambulatory Visit: Payer: Self-pay

## 2019-03-09 ENCOUNTER — Inpatient Hospital Stay (HOSPITAL_BASED_OUTPATIENT_CLINIC_OR_DEPARTMENT_OTHER): Payer: Medicare Other | Admitting: Hematology and Oncology

## 2019-03-09 VITALS — BP 133/62 | HR 75 | Temp 97.9°F | Resp 17 | Ht 68.0 in | Wt 150.9 lb

## 2019-03-09 DIAGNOSIS — Z170 Estrogen receptor positive status [ER+]: Secondary | ICD-10-CM | POA: Diagnosis not present

## 2019-03-09 DIAGNOSIS — Z79811 Long term (current) use of aromatase inhibitors: Secondary | ICD-10-CM

## 2019-03-09 DIAGNOSIS — C7951 Secondary malignant neoplasm of bone: Secondary | ICD-10-CM | POA: Diagnosis not present

## 2019-03-09 DIAGNOSIS — C7802 Secondary malignant neoplasm of left lung: Secondary | ICD-10-CM | POA: Diagnosis not present

## 2019-03-09 DIAGNOSIS — Z9012 Acquired absence of left breast and nipple: Secondary | ICD-10-CM

## 2019-03-09 DIAGNOSIS — C787 Secondary malignant neoplasm of liver and intrahepatic bile duct: Secondary | ICD-10-CM | POA: Diagnosis not present

## 2019-03-09 DIAGNOSIS — R634 Abnormal weight loss: Secondary | ICD-10-CM | POA: Diagnosis not present

## 2019-03-09 DIAGNOSIS — C50412 Malignant neoplasm of upper-outer quadrant of left female breast: Secondary | ICD-10-CM | POA: Diagnosis not present

## 2019-03-09 DIAGNOSIS — Z923 Personal history of irradiation: Secondary | ICD-10-CM | POA: Diagnosis not present

## 2019-03-09 DIAGNOSIS — C7801 Secondary malignant neoplasm of right lung: Secondary | ICD-10-CM | POA: Diagnosis not present

## 2019-03-09 DIAGNOSIS — J910 Malignant pleural effusion: Secondary | ICD-10-CM | POA: Diagnosis not present

## 2019-03-09 DIAGNOSIS — E785 Hyperlipidemia, unspecified: Secondary | ICD-10-CM | POA: Diagnosis not present

## 2019-03-09 MED ORDER — DEXAMETHASONE 1 MG PO TABS: 0.5000 mg | ORAL_TABLET | Freq: Every day | ORAL | 3 refills | Status: DC

## 2019-03-09 MED ORDER — DEXAMETHASONE 0.5 MG/5ML PO SOLN: ORAL | 3 refills | Status: DC

## 2019-03-09 MED ORDER — EVEROLIMUS 10 MG PO TABS: 10.0000 mg | ORAL_TABLET | Freq: Every day | ORAL | 3 refills | Status: DC

## 2019-03-09 MED ORDER — EXEMESTANE 25 MG PO TABS: 25.0000 mg | ORAL_TABLET | Freq: Every day | ORAL | 3 refills | Status: DC

## 2019-03-09 NOTE — Telephone Encounter (Signed)
Oral Oncology Patient Advocate Encounter  Received notification from [facility] that prior authorization for Afinitor is required.  PA submitted on CoverMyMeds Key [id number] Status is pending  Oral Oncology Clinic will continue to follow.  [place] Patient [place] Phone [phone] Fax [phone] 03/09/2019    12:54 PM

## 2019-03-09 NOTE — Telephone Encounter (Signed)
Oral Oncology Patient Advocate Encounter  Afinitor copay $855.74 Dex mouthwash copay $3.60 Exemestane copay $29.94  The patient has a [place] that will make her out of pocket cost for all 3 medicines $0.  [place] Patient [place] Phone [phone] Fax [phone] 03/09/2019   4:08 PM

## 2019-03-09 NOTE — Telephone Encounter (Signed)
Oral Oncology Pharmacist Encounter  Received new prescription for Afinitor (everolimus) for the treatment of metastatic, hormone receptor positive, HER-2 receptor negative, breast cancer in conjunction with exemestane, planned duration until disease progression or unacceptable toxicity.  Original diagnosis of early stage disease in late 2014 Left mastectomy was performed 11/12/2013 Patient received adjuvant chemotherapy with docetaxel and cyclophosphamide x 6 cycles 12/17/2013-04/02/2014 Patient then received adjuvant chest wall irradiation June-August 2015 and was started on adjuvant endocrine therapy with Arimidex Metastatic disease was noted in January 2019 and patient was started on treatment with Ibrance and fulvestrant in February 2019 CT scans performed 03/01/2019 shows progression of disease in the liver Patient is now under evaluation to begin treatment with Afinitor and exemestane.  Afinitor is planned to be administered at 10 mg by mouth once daily Exemestane is planned to be administered at 25 mg by mouth once daily  Labs from 03/01/2019 assessed, OK for treatment initiation. BPs reviewed, WNL, will continue to be monitored. MD will be monitoring blood glucose and lipid levels  Current medication list in Epic reviewed, no significant DDIs with Afinitor identified.  Prescriptions have been e-scribed to the [facility] for benefits analysis and approval by MD. Prescription for dexamethasone mouthwash for stomatitis prevention has been e-scribed to the [facility].  Oral Oncology Clinic will continue to follow for insurance authorization, copayment issues, initial counseling and start date.  Johny Drilling, PharmD, BCPS, BCOP  03/09/2019 11:09 AM Oral Oncology Clinic [phone]

## 2019-03-09 NOTE — Telephone Encounter (Signed)
Oral Oncology Patient Advocate Encounter  Prior Authorization for Afinitor has been approved.    PA# [id number] Effective dates: 03/09/19 through 03/08/20  Oral Oncology Clinic will continue to follow.   [place] Patient [place] Phone [phone] Fax [phone] 03/09/2019    4:02 PM

## 2019-03-10 ENCOUNTER — Telehealth: Payer: Self-pay | Admitting: Hematology and Oncology

## 2019-03-10 NOTE — Telephone Encounter (Signed)
Tried to reach regarding 5/4

## 2019-03-10 NOTE — Telephone Encounter (Signed)
Oral Chemotherapy Pharmacist Encounter   I spoke with patient for overview of: Afinitor (everolimus) for the treatment of metastatic, hormone receptor positive, HER-2 receptor negative, breast cancer in conjunction with exemestane, planned duration until disease progression or unacceptable toxicity.   Counseled patient on administration, dosing, side effects, monitoring, drug-food interactions, safe handling, storage, and disposal.  Patient will take Afinitor 4m tablets, 1 tablet by mouth once daily, with water, without regard to food.  Patient understands to take Afinitor consistently with regards to food and at approximately the same time each day.  Patient knows to aviod grapefruit or grapefruit juice while on therapy with Afinitor.  Patient will take exemestane 232mtablets, 1 tablet by mouth once daily after a meal. Patient plans to take her Afinitor and exemestane after breakfast daily.  Afinitor and exemestane start date: 03/13/19 Patient will take last dose of Ibrance on 03/12/19  Adverse effects include but are not limited to: mouth sores, GI upset, nausea, diarrhea, constipation, rash, increased blood sugars, decreased blood counts, increased blood pressure, pulmonary toxicity, and edema.   Patient currently on atorvastatin 2029mor lipid control. Patient endorses leg cramping at 66m21mily that is not present at the 20mg33mly dose.  Dexamethasone mouthwash for the prevention of stomatitis has been e-scribed to [facility] discussed appropriate use of mouthwash and duration of stomatitis prevention.  Reviewed with patient importance of keeping a medication schedule and plan for any missed doses.  Mrs. GlossKannaned understanding and appreciation.   All questions answered. Medication reconciliation performed and medication/allergy list updated.  Insurance authorization for Afinitor has been obtained. Test claim at the pharmacy revealed copayment $855.74 for  1st fill of Afinitor. Previously secured [employer] cover all of the out of pocket costs for Afinitor, exemestane, and dexamethasone mouthwash at this time. Out of pocket expense for patient is $0 These medications will ship from the [place]/16/20 to deliver to patient's home on 03/12/19.  Patient informed the pharmacy will reach out 5-7 days prior to needing next fill of Afinitor to coordinate continued medication acquisition to prevent break in therapy.  Patient knows to call the office with questions or concerns. Oral Oncology Clinic will continue to follow.  JesseJohny DrillingrmD, BCPS, BCOP  03/10/2019 10:41 AM Oral Oncology Clinic [phone]

## 2019-03-11 MED FILL — DEXAMETHASONE 0.5 MG/5 ML L: 0.5 | 12 days supply | Qty: 500 | Fill #0

## 2019-03-11 MED FILL — EXEMESTANE 25 MG TABS: 25 | 90 days supply | Qty: 90 | Fill #0

## 2019-03-11 MED FILL — AFINITOR 10 MG TABLET: 10 | 28 days supply | Qty: 28 | Fill #0

## 2019-03-12 NOTE — Telephone Encounter (Signed)
Oral Oncology Patient Advocate Encounter  Confirmed with [place] that Afinitor, Dex Mouthwash and Exemestane was shipped on 03/11/19 with a $0 copay on all medicines using [place].   [place] Patient [place] Phone [phone] Fax [phone] 03/12/2019   8:26 AM

## 2019-03-22 ENCOUNTER — Other Ambulatory Visit: Payer: Self-pay | Admitting: Hematology and Oncology

## 2019-03-23 NOTE — Assessment & Plan Note (Signed)
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 She had radiation therapy and has been on antiestrogen therapy with Arimidex 06/28/2014-January 2019  Thoracentesis: Met Breast cancer ER PR Positive and Her 2 Neg PET/CT scan. 01/04/18: New malignant Pleural effusion with small bil pulm nodules,new mild bilateral mediastinal hilar portocaval lymph node metastases, new liver metastases, new diffuse bone metastases.  Ibrance with Faslodexstarted 12/29/2017-02/27/2019  --------------------------------------------------------------------------- Current treatment: Exemestane with everolimus started 03/09/2019  Toxicities:  Weight loss:Zofran will be scheduled every 8 hours.Megace was not working. On 1 mg of Decadron   Return to clinic monthly for labs and follow-up

## 2019-03-27 NOTE — Progress Notes (Signed)
Patient Care Team: Delorse Limber as PCP - General (Family Medicine)  DIAGNOSIS:    ICD-10-CM   1. Malignant neoplasm of upper-outer quadrant of left breast in female, estrogen receptor positive ([place]) C50.412 CBC with Differential ([facility])   Z17.0 [place] ([facility] only)    SUMMARY OF ONCOLOGIC HISTORY:   Malignant neoplasm of upper-outer quadrant of left breast in female, estrogen receptor positive ([place])   07/28/2013 Mammogram    Spiculated mass at 6:00 position 3 cm, ultrasound revealed 2.6 x 2.1 x 1.5 cm second mass at 12:00 2.1 x 1.8 x 2.1 cm: 2 abnormal lymph nodes left axilla    10/29/2013 Initial Diagnosis    Breast cancer of upper-outer quadrant of left female breast: Invasive ductal carcinoma ER 100%, PR 100%, HER-2 negative, Ki-67 14%    11/12/2013 Surgery    Left mastectomy.by Dr. [name], T3N2 (5/10 LN positive) stage IIIa IDC grade 1, ER/PR 100%, TSH 14% HER-2 negative PET/CT 12/20/2013 negative for metastases    12/17/2013 - 04/02/2014 Chemotherapy    Taxotere Cytoxan every 3 weeks for 6 cycles    05/11/2014 - 06/27/2014 Radiation Therapy    Adjuvant radiation therapy to chest wall and lymph nodes    07/06/2014 -  Anti-estrogen oral therapy    Arimidex 1 mg daily    12/21/2017 Imaging    Metastatic lesions noted in the lungs bilaterally, bilateral hilar and mediastinal lymph nodes, left pleural effusion, multiple thoracic vertebral metastases, heterogeneous lesions in the liver    12/22/2017 Procedure    Pleural Effusion Thoracentesis: Metastatic Breast cancer ER PR Positive Her 2 Neg    12/29/2017 Treatment Plan Change    Ibrance, Faslodex and Xgeva    03/09/2019 Treatment Plan Change    Everolimus and Exemestane     CHIEF COMPLIANT: Follow-up on everolimus and exemestane  INTERVAL HISTORY: Wendy [name] is a 78 y.o. with above-mentioned history of metastatic breast cancer currently on treatment with everolimus and exemestane. She presents  to the clinic today for a toxicity check.  First week after she started everolimus that she felt cloudy in the head but denies any mouth sores.  Those symptoms resolved.  She is doing much better.  She has gained 4 pounds.  She has had mild discomfort in the right upper quadrant related to her liver.  She is bruising extensively on her arms from aspirin therapy.  REVIEW OF SYSTEMS:   Constitutional: Denies fevers, chills or abnormal weight loss Eyes: Denies blurriness of vision Ears, nose, mouth, throat, and face: Denies mucositis or sore throat Respiratory: Denies cough, dyspnea or wheezes Cardiovascular: Denies palpitation, chest discomfort Gastrointestinal: Denies nausea, heartburn or change in bowel habits Skin: Denies abnormal skin rashes Lymphatics: Denies new lymphadenopathy or easy bruising Neurological: Denies numbness, tingling or new weaknesses Behavioral/Psych: Mood is stable, no new changes  Extremities: No lower extremity edema, bruising on the arms Breast: denies any pain or lumps or nodules in either breasts All other systems were reviewed with the patient and are negative.  I have reviewed the past medical history, past surgical history, social history and family history with the patient and they are unchanged from previous note.  ALLERGIES:  has No Known Allergies.  MEDICATIONS:  Current Outpatient Medications  Medication Sig Dispense Refill  . acetaminophen (TYLENOL) 500 MG tablet Take 1,000 mg by mouth every 6 (six) hours as needed for mild pain.    [name] aspirin EC 81 MG tablet Take 81 mg by  mouth daily.    [name] atorvastatin (LIPITOR) 40 MG tablet Take 20 mg by mouth at bedtime.     . Calcium Carb-Cholecalciferol (CALCIUM 600 + D PO) Take 1 tablet by mouth daily.    [name] dexamethasone (DECADRON) 0.5 MG/5ML solution Swish 76m in mouth for 247m and spit out. Use 4 times daily for 8 weeks, start with Afinitor. Avoid eating/drinking for 1hr after rinse. 500 mL 3  . dexamethasone  (DECADRON) 1 MG tablet Take 0.5 tablets (0.5 mg total) by mouth daily. 30 tablet 3  . everolimus (AFINITOR) 10 MG tablet Take 1 tablet (10 mg total) by mouth daily. 30 tablet 3  . exemestane (AROMASIN) 25 MG tablet Take 1 tablet (25 mg total) by mouth daily after breakfast. 90 tablet 3  . hydrochlorothiazide (MICROZIDE) 12.5 MG capsule Take 1 capsule (12.5 mg total) by mouth daily. 90 capsule 3  . Multiple Vitamin (MULTIVITAMIN WITH MINERALS) TABS tablet Take 1 tablet by mouth daily.    . nitroGLYCERIN (NITROSTAT) 0.4 MG SL tablet PLACE 1 TABLET (0.4 MG TOTAL) UNDER THE TONGUE EVERY 5 (FIVE) MINUTES AS NEEDED FOR CHEST PAIN. 25 tablet 0  . ondansetron (ZOFRAN ODT) 8 MG disintegrating tablet Take 1 tablet (8 mg total) by mouth every 8 (eight) hours as needed for nausea or vomiting. 30 tablet 6  . oxyCODONE-acetaminophen (PERCOCET/ROXICET) 5-325 MG tablet Take 1 tablet by mouth every 8 (eight) hours as needed for severe pain. 30 tablet 0  . promethazine (PHENERGAN) 25 MG tablet TAKE 1 TABLET (25 MG TOTAL) BY MOUTH EVERY 8 (EIGHT) HOURS AS NEEDED FOR NAUSEA. 30 tablet 0   No current facility-administered medications for this visit.     PHYSICAL EXAMINATION: ECOG PERFORMANCE STATUS: 1 - Symptomatic but completely ambulatory  Vitals:   03/29/19 0836  BP: (!) 124/53  Pulse: 81  Resp: 17  Temp: 97.9 F (36.6 C)  SpO2: 98%   Filed Weights   03/29/19 0836  Weight: 154 lb 1.6 oz (69.9 kg)    GENERAL: alert, no distress and comfortable SKIN: skin color, texture, turgor are normal, no rashes or significant lesions EYES: normal, Conjunctiva are pink and non-injected, sclera clear OROPHARYNX: no exudate, no erythema and lips, buccal mucosa, and tongue normal  NECK: supple, thyroid normal size, non-tender, without nodularity LYMPH: no palpable lymphadenopathy in the cervical, axillary or inguinal LUNGS: clear to auscultation and percussion with normal breathing effort HEART: regular rate &  rhythm and no murmurs and no lower extremity edema ABDOMEN: abdomen soft, non-tender and normal bowel sounds MUSCULOSKELETAL: no cyanosis of digits and no clubbing  NEURO: alert & oriented x 3 with fluent speech, no focal motor/sensory deficits EXTREMITIES: No lower extremity edema  LABORATORY DATA:  I have reviewed the data as listed CMP Latest Ref Rng & Units 03/01/2019 11/30/2018 11/02/2018  Glucose 70 - 99 mg/dL 83 82 87  BUN 8 - 23 mg/dL 24(H) 14 15  Creatinine 0.44 - 1.00 mg/dL 0.97 0.99 0.91  Sodium 135 - 145 mmol/L 142 142 142  Potassium 3.5 - 5.1 mmol/L 3.7 3.6 3.6  Chloride 98 - 111 mmol/L 102 108 108  CO2 22 - 32 mmol/L 29 23 24   Calcium 8.9 - 10.3 mg/dL 9.4 9.6 9.3  Total Protein 6.5 - 8.1 g/dL 7.2 7.1 6.9  Total Bilirubin 0.3 - 1.2 mg/dL 0.4 0.4 0.5  Alkaline Phos 38 - 126 U/L 62 52 55  AST 15 - 41 U/L 26 18 19   ALT 0 - 44 U/L  21 12 10     Lab Results  Component Value Date   WBC 3.0 (L) 03/29/2019   HGB 11.4 (L) 03/29/2019   HCT 35.2 (L) 03/29/2019   MCV 105.4 (H) 03/29/2019   PLT 139 (L) 03/29/2019   NEUTROABS 1.2 (L) 03/29/2019    ASSESSMENT & PLAN:  Malignant neoplasm of upper-outer quadrant of left breast in female, 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 She had radiation therapy and has been on antiestrogen therapy with Arimidex 06/28/2014-January 2019  Thoracentesis: Met Breast cancer ER PR Positive and Her 2 Neg PET/CT scan. 01/04/18: New malignant Pleural effusion with small bil pulm nodules,new mild bilateral mediastinal hilar portocaval lymph node metastases, new liver metastases, new diffuse bone metastases.  Ibrance with Faslodexstarted 12/29/2017-02/27/2019  --------------------------------------------------------------------------- Current treatment: Exemestane  with everolimus started 03/09/2019  Toxicities: Denies any mouth sores or diarrhea.  Does have fatigue.  Overall she appears to be tolerating the treatment very well.  Weight loss: Patient is gaining weight and I instructed her to take half a tablet of dexamethasone every other day. Lab review: Leukopenia, thrombocytopenia and anemia: Mild. Awaiting the results of lipid panel and glucose.  I will call her with results.  Return to clinic monthly for labs and follow-up    Orders Placed This Encounter  Procedures  . CBC with Differential ([facility] Only)    Standing Status:   Future    Standing Expiration Date:   03/28/2020  . CMP ([facility] only)    Standing Status:   Future    Standing Expiration Date:   03/28/2020   The patient has a good understanding of the overall plan. she agrees with it. she will call with any problems that may develop before the next visit here.  [name] Lose, MD 03/29/2019  Julious Oka Dorshimer am acting as scribe for Dr. [name] Lose.  I have reviewed the above documentation for accuracy and completeness, and I agree with the above.

## 2019-03-29 ENCOUNTER — Inpatient Hospital Stay (HOSPITAL_BASED_OUTPATIENT_CLINIC_OR_DEPARTMENT_OTHER): Payer: Medicare Other | Admitting: Hematology and Oncology

## 2019-03-29 ENCOUNTER — Other Ambulatory Visit: Payer: Self-pay

## 2019-03-29 ENCOUNTER — Inpatient Hospital Stay: Payer: Medicare Other | Attending: Hematology and Oncology

## 2019-03-29 DIAGNOSIS — Z9221 Personal history of antineoplastic chemotherapy: Secondary | ICD-10-CM

## 2019-03-29 DIAGNOSIS — C779 Secondary and unspecified malignant neoplasm of lymph node, unspecified: Secondary | ICD-10-CM | POA: Insufficient documentation

## 2019-03-29 DIAGNOSIS — Z170 Estrogen receptor positive status [ER+]: Secondary | ICD-10-CM | POA: Insufficient documentation

## 2019-03-29 DIAGNOSIS — C787 Secondary malignant neoplasm of liver and intrahepatic bile duct: Secondary | ICD-10-CM

## 2019-03-29 DIAGNOSIS — D649 Anemia, unspecified: Secondary | ICD-10-CM | POA: Insufficient documentation

## 2019-03-29 DIAGNOSIS — C7801 Secondary malignant neoplasm of right lung: Secondary | ICD-10-CM | POA: Insufficient documentation

## 2019-03-29 DIAGNOSIS — C7951 Secondary malignant neoplasm of bone: Secondary | ICD-10-CM

## 2019-03-29 DIAGNOSIS — Z79811 Long term (current) use of aromatase inhibitors: Secondary | ICD-10-CM | POA: Diagnosis not present

## 2019-03-29 DIAGNOSIS — Z79899 Other long term (current) drug therapy: Secondary | ICD-10-CM | POA: Insufficient documentation

## 2019-03-29 DIAGNOSIS — Z9012 Acquired absence of left breast and nipple: Secondary | ICD-10-CM | POA: Diagnosis not present

## 2019-03-29 DIAGNOSIS — C50412 Malignant neoplasm of upper-outer quadrant of left female breast: Secondary | ICD-10-CM | POA: Diagnosis not present

## 2019-03-29 DIAGNOSIS — Z923 Personal history of irradiation: Secondary | ICD-10-CM

## 2019-03-29 DIAGNOSIS — C7802 Secondary malignant neoplasm of left lung: Secondary | ICD-10-CM | POA: Diagnosis not present

## 2019-03-29 DIAGNOSIS — J910 Malignant pleural effusion: Secondary | ICD-10-CM | POA: Insufficient documentation

## 2019-03-29 DIAGNOSIS — R5383 Other fatigue: Secondary | ICD-10-CM | POA: Diagnosis not present

## 2019-03-29 DIAGNOSIS — D696 Thrombocytopenia, unspecified: Secondary | ICD-10-CM | POA: Insufficient documentation

## 2019-03-29 DIAGNOSIS — D72819 Decreased white blood cell count, unspecified: Secondary | ICD-10-CM | POA: Insufficient documentation

## 2019-03-29 DIAGNOSIS — R635 Abnormal weight gain: Secondary | ICD-10-CM | POA: Diagnosis not present

## 2019-03-29 DIAGNOSIS — C7800 Secondary malignant neoplasm of unspecified lung: Secondary | ICD-10-CM

## 2019-03-29 DIAGNOSIS — E785 Hyperlipidemia, unspecified: Secondary | ICD-10-CM

## 2019-03-29 LAB — CBC WITH DIFFERENTIAL (CANCER CENTER ONLY)
Abs Immature Granulocytes: 0.07 10*3/uL (ref 0.00–0.07)
Basophils Absolute: 0.1 10*3/uL (ref 0.0–0.1)
Basophils Relative: 2 %
Eosinophils Absolute: 0.1 10*3/uL (ref 0.0–0.5)
Eosinophils Relative: 3 %
HCT: 35.2 % — ABNORMAL LOW (ref 36.0–46.0)
Hemoglobin: 11.4 g/dL — ABNORMAL LOW (ref 12.0–15.0)
Immature Granulocytes: 2 %
Lymphocytes Relative: 30 %
Lymphs Abs: 0.9 10*3/uL (ref 0.7–4.0)
MCH: 34.1 pg — ABNORMAL HIGH (ref 26.0–34.0)
MCHC: 32.4 g/dL (ref 30.0–36.0)
MCV: 105.4 fL — ABNORMAL HIGH (ref 80.0–100.0)
Monocytes Absolute: 0.6 10*3/uL (ref 0.1–1.0)
Monocytes Relative: 22 %
Neutro Abs: 1.2 10*3/uL — ABNORMAL LOW (ref 1.7–7.7)
Neutrophils Relative %: 41 %
Platelet Count: 139 10*3/uL — ABNORMAL LOW (ref 150–400)
RBC: 3.34 MIL/uL — ABNORMAL LOW (ref 3.87–5.11)
RDW: 14.1 % (ref 11.5–15.5)
WBC Count: 3 10*3/uL — ABNORMAL LOW (ref 4.0–10.5)
nRBC: 0.7 % — ABNORMAL HIGH (ref 0.0–0.2)

## 2019-03-29 LAB — CMP (CANCER CENTER ONLY)
ALT: 23 U/L (ref 0–44)
AST: 32 U/L (ref 15–41)
Albumin: 3.2 g/dL — ABNORMAL LOW (ref 3.5–5.0)
Alkaline Phosphatase: 52 U/L (ref 38–126)
Anion gap: 11 (ref 5–15)
BUN: 20 mg/dL (ref 8–23)
CO2: 26 mmol/L (ref 22–32)
Calcium: 8.7 mg/dL — ABNORMAL LOW (ref 8.9–10.3)
Chloride: 107 mmol/L (ref 98–111)
Creatinine: 0.91 mg/dL (ref 0.44–1.00)
GFR, Est AFR Am: >60 mL/min (ref >60)
GFR, Estimated: >60 mL/min (ref >60)
Glucose, Bld: 93 mg/dL (ref 70–99)
Potassium: 3.7 mmol/L (ref 3.5–5.1)
Sodium: 144 mmol/L (ref 135–145)
Total Bilirubin: 0.3 mg/dL (ref 0.3–1.2)
Total Protein: 6.5 g/dL (ref 6.5–8.1)

## 2019-03-29 LAB — LIPID PANEL
Cholesterol: 157 mg/dL (ref 0–200)
HDL: 61 mg/dL (ref >40)
LDL Cholesterol: 79 mg/dL (ref 0–99)
Total CHOL/HDL Ratio: 2.6 RATIO
Triglycerides: 83 mg/dL (ref <150)
VLDL: 17 mg/dL (ref 0–40)

## 2019-03-30 ENCOUNTER — Telehealth: Payer: Self-pay | Admitting: Hematology and Oncology

## 2019-03-30 NOTE — Telephone Encounter (Signed)
Tried to reach °

## 2019-04-05 MED FILL — AFINITOR 10 MG TABLET: 10 | 28 days supply | Qty: 28 | Fill #1

## 2019-04-05 MED FILL — DEXAMETHASONE 0.5 MG/5 ML L: 0.5 | 12 days supply | Qty: 500 | Fill #1

## 2019-04-08 ENCOUNTER — Other Ambulatory Visit: Payer: Self-pay | Admitting: Hematology and Oncology

## 2019-04-08 MED ORDER — OXYCODONE-ACETAMINOPHEN 5-325 MG PO TABS: 1.0000 | ORAL_TABLET | Freq: Three times a day (TID) | ORAL | 0 refills | Status: DC | PRN

## 2019-04-20 NOTE — Assessment & Plan Note (Signed)
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 She had radiation therapy and has been on antiestrogen therapy with Arimidex 06/28/2014-January 2019  Thoracentesis: Met Breast cancer ER PR Positive and Her 2 Neg PET/CT scan. 01/04/18: New malignant Pleural effusion with small bil pulm nodules,new mild bilateral mediastinal hilar portocaval lymph node metastases, new liver metastases, new diffuse bone metastases.  Ibrance with Faslodexstarted 12/29/2017-02/27/2019  --------------------------------------------------------------------------- Current treatment: Exemestane with everolimus started 03/09/2019  Toxicities: Denies any mouth sores or diarrhea.  Does have fatigue.  Overall she appears to be tolerating the treatment very well.  Weight loss: Patient is gaining weight and I instructed her to take half a tablet of dexamethasone every other day. Lab review: Leukopenia, thrombocytopenia and anemia: Mild. Lipid panel: 03/29/2019: Total cholesterol 157, HDL 61, LDL 79 Blood sugar 93, albumin 3.2  Return to clinic every other month for labs and follow-up

## 2019-04-25 NOTE — Progress Notes (Signed)
Patient Care Team: Delorse Limber as PCP - General (Family Medicine)  DIAGNOSIS:    ICD-10-CM   1. Malignant neoplasm of upper-outer quadrant of left breast in female, estrogen receptor positive ([place]) C50.412 dexamethasone (DECADRON) 0.5 MG/5ML solution   Z17.0     SUMMARY OF ONCOLOGIC HISTORY:   Malignant neoplasm of upper-outer quadrant of left breast in female, estrogen receptor positive ([place])   07/28/2013 Mammogram    Spiculated mass at 6:00 position 3 cm, ultrasound revealed 2.6 x 2.1 x 1.5 cm second mass at 12:00 2.1 x 1.8 x 2.1 cm: 2 abnormal lymph nodes left axilla    10/29/2013 Initial Diagnosis    Breast cancer of upper-outer quadrant of left female breast: Invasive ductal carcinoma ER 100%, PR 100%, HER-2 negative, Ki-67 14%    11/12/2013 Surgery    Left mastectomy.by Dr. [name], T3N2 (5/10 LN positive) stage IIIa IDC grade 1, ER/PR 100%, TSH 14% HER-2 negative PET/CT 12/20/2013 negative for metastases    12/17/2013 - 04/02/2014 Chemotherapy    Taxotere Cytoxan every 3 weeks for 6 cycles    05/11/2014 - 06/27/2014 Radiation Therapy    Adjuvant radiation therapy to chest wall and lymph nodes    07/06/2014 -  Anti-estrogen oral therapy    Arimidex 1 mg daily    12/21/2017 Imaging    Metastatic lesions noted in the lungs bilaterally, bilateral hilar and mediastinal lymph nodes, left pleural effusion, multiple thoracic vertebral metastases, heterogeneous lesions in the liver    12/22/2017 Procedure    Pleural Effusion Thoracentesis: Metastatic Breast cancer ER PR Positive Her 2 Neg    12/29/2017 Treatment Plan Change    Ibrance, Faslodex and Xgeva    03/09/2019 Treatment Plan Change    Everolimus and Exemestane     CHIEF COMPLIANT: Follow-up on everolimus and exemestane  INTERVAL HISTORY: Wendy [name] is a 78 y.o. with above-mentioned history of metastatic breast cancer currently on treatment with everolimus and exemestane. She presents to the clinic today for  a toxicity check.   REVIEW OF SYSTEMS:   Constitutional: Denies fevers, chills or abnormal weight loss Eyes: Denies blurriness of vision Ears, nose, mouth, throat, and face: Denies mucositis or sore throat Respiratory: Denies cough, dyspnea or wheezes Cardiovascular: Denies palpitation, chest discomfort Gastrointestinal: Denies nausea, heartburn or change in bowel habits Skin: Denies abnormal skin rashes Lymphatics: Denies new lymphadenopathy or easy bruising Neurological: Denies numbness, tingling or new weaknesses Behavioral/Psych: Mood is stable, no new changes  Extremities: No lower extremity edema Breast: denies any pain or lumps or nodules in either breasts All other systems were reviewed with the patient and are negative.  I have reviewed the past medical history, past surgical history, social history and family history with the patient and they are unchanged from previous note.  ALLERGIES:  has No Known Allergies.  MEDICATIONS:  Current Outpatient Medications  Medication Sig Dispense Refill  . acetaminophen (TYLENOL) 500 MG tablet Take 1,000 mg by mouth every 6 (six) hours as needed for mild pain.    [name] aspirin EC 81 MG tablet Take 81 mg by mouth daily.    [name] atorvastatin (LIPITOR) 20 MG tablet Take 1 tablet (20 mg total) by mouth at bedtime. 90 tablet 3  . Calcium Carb-Cholecalciferol (CALCIUM 600 + D PO) Take 1 tablet by mouth daily.    [name] dexamethasone (DECADRON) 0.5 MG/5ML solution Swish 46m in mouth for 210m and spit out. Use 4 times daily for 8 weeks, start with Afinitor. Avoid  eating/drinking for 1hr after rinse. 500 mL 3  . everolimus (AFINITOR) 10 MG tablet Take 1 tablet (10 mg total) by mouth daily. 30 tablet 3  . exemestane (AROMASIN) 25 MG tablet Take 1 tablet (25 mg total) by mouth daily after breakfast. 90 tablet 3  . hydrochlorothiazide (MICROZIDE) 12.5 MG capsule Take 1 capsule (12.5 mg total) by mouth daily. 90 capsule 3  . Multiple Vitamin (MULTIVITAMIN WITH  MINERALS) TABS tablet Take 1 tablet by mouth daily.    . nitroGLYCERIN (NITROSTAT) 0.4 MG SL tablet PLACE 1 TABLET (0.4 MG TOTAL) UNDER THE TONGUE EVERY 5 (FIVE) MINUTES AS NEEDED FOR CHEST PAIN. 25 tablet 0  . ondansetron (ZOFRAN ODT) 8 MG disintegrating tablet Take 1 tablet (8 mg total) by mouth every 8 (eight) hours as needed for nausea or vomiting. 30 tablet 6  . oxyCODONE-acetaminophen (PERCOCET/ROXICET) 5-325 MG tablet Take 1 tablet by mouth every 8 (eight) hours as needed for severe pain. 30 tablet 0   No current facility-administered medications for this visit.     PHYSICAL EXAMINATION: ECOG PERFORMANCE STATUS: 1 - Symptomatic but completely ambulatory  Vitals:   04/26/19 0821  BP: (!) 115/98  Pulse: 85  Resp: 17  Temp: 98.3 F (36.8 C)  SpO2: 100%   Filed Weights   04/26/19 0821  Weight: 155 lb 1.6 oz (70.4 kg)    GENERAL: alert, no distress and comfortable SKIN: skin color, texture, turgor are normal, no rashes or significant lesions EYES: normal, Conjunctiva are pink and non-injected, sclera clear OROPHARYNX: no exudate, no erythema and lips, buccal mucosa, and tongue normal  NECK: supple, thyroid normal size, non-tender, without nodularity LYMPH: no palpable lymphadenopathy in the cervical, axillary or inguinal LUNGS: clear to auscultation and percussion with normal breathing effort HEART: regular rate & rhythm and no murmurs and no lower extremity edema ABDOMEN: abdomen soft, non-tender and normal bowel sounds MUSCULOSKELETAL: no cyanosis of digits and no clubbing  NEURO: alert & oriented x 3 with fluent speech, no focal motor/sensory deficits EXTREMITIES: No lower extremity edema  LABORATORY DATA:  I have reviewed the data as listed CMP Latest Ref Rng & Units 03/29/2019 03/01/2019 11/30/2018  Glucose 70 - 99 mg/dL 93 83 82  BUN 8 - 23 mg/dL 20 24(H) 14  Creatinine 0.44 - 1.00 mg/dL 0.91 0.97 0.99  Sodium 135 - 145 mmol/L 144 142 142  Potassium 3.5 - 5.1 mmol/L 3.7  3.7 3.6  Chloride 98 - 111 mmol/L 107 102 108  CO2 22 - 32 mmol/L _0 Calcium 8.9 - 10.3 mg/dL 8.7(L) 9.4 9.6  Total Protein 6.5 - 8.1 g/dL 6.5 7.2 7.1  Total Bilirubin 0.3 - 1.2 mg/dL 0.3 0.4 0.4  Alkaline Phos 38 - 126 U/L 52 62 52  AST 15 - 41 U/L 32 26 18  ALT 0 - 44 U/L _1 Lab Results  Component Value Date   WBC 5.2 04/26/2019   HGB 12.0 04/26/2019   HCT 38.4 04/26/2019   MCV 98.5 04/26/2019   PLT 182 04/26/2019   NEUTROABS 3.4 04/26/2019    ASSESSMENT & PLAN:  Malignant neoplasm of upper-outer quadrant of left breast in female, 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 She had radiation therapy and has been on antiestrogen therapy with Arimidex 06/28/2014-January 2019  Thoracentesis: Met Breast cancer ER PR Positive and Her 2 Neg PET/CT scan. 01/04/18: New malignant Pleural effusion with small bil pulm nodules,new mild bilateral mediastinal hilar portocaval lymph node metastases, new liver metastases, new diffuse bone metastases.  Ibrance with Faslodexstarted 12/29/2017-02/27/2019  --------------------------------------------------------------------------- Current treatment: Exemestane with everolimus started 03/09/2019  Toxicities: Denies any mouth sores or diarrhea.  Overall she appears to be tolerating the treatment very well. Major side effect is fatigue  Weight loss:  I will discontinue dexamethasone since her weight has been stable. Lab review: Leukopenia, thrombocytopenia and anemia: Mild.:  These lab values have normalized today. Lipid panel: 03/29/2019: Total cholesterol 157, HDL 61, LDL 79  Patient will complete 3 months in 6 weeks.  We will obtain CT chest abdomen pelvis with contrast on follow-up after that with a video visit.  She will undergo lab work on  the day of the scan.  No orders of the defined types were placed in this encounter.  The patient has a good understanding of the overall plan. she agrees with it. she will call with any problems that may develop before the next visit here.  [name] Lose, MD 04/26/2019  Julious Oka Dorshimer am acting as scribe for Dr. [name] Lose.  I have reviewed the above documentation for accuracy and completeness, and I agree with the above.

## 2019-04-26 ENCOUNTER — Inpatient Hospital Stay: Payer: Medicare Other | Attending: Hematology and Oncology

## 2019-04-26 ENCOUNTER — Inpatient Hospital Stay (HOSPITAL_BASED_OUTPATIENT_CLINIC_OR_DEPARTMENT_OTHER): Payer: Medicare Other | Admitting: Hematology and Oncology

## 2019-04-26 ENCOUNTER — Other Ambulatory Visit: Payer: Self-pay

## 2019-04-26 DIAGNOSIS — J910 Malignant pleural effusion: Secondary | ICD-10-CM | POA: Diagnosis not present

## 2019-04-26 DIAGNOSIS — C779 Secondary and unspecified malignant neoplasm of lymph node, unspecified: Secondary | ICD-10-CM

## 2019-04-26 DIAGNOSIS — C787 Secondary malignant neoplasm of liver and intrahepatic bile duct: Secondary | ICD-10-CM | POA: Diagnosis not present

## 2019-04-26 DIAGNOSIS — Z79899 Other long term (current) drug therapy: Secondary | ICD-10-CM | POA: Insufficient documentation

## 2019-04-26 DIAGNOSIS — C50412 Malignant neoplasm of upper-outer quadrant of left female breast: Secondary | ICD-10-CM

## 2019-04-26 DIAGNOSIS — Z9012 Acquired absence of left breast and nipple: Secondary | ICD-10-CM | POA: Diagnosis not present

## 2019-04-26 DIAGNOSIS — C7951 Secondary malignant neoplasm of bone: Secondary | ICD-10-CM | POA: Diagnosis not present

## 2019-04-26 DIAGNOSIS — C7801 Secondary malignant neoplasm of right lung: Secondary | ICD-10-CM

## 2019-04-26 DIAGNOSIS — Z79811 Long term (current) use of aromatase inhibitors: Secondary | ICD-10-CM

## 2019-04-26 DIAGNOSIS — R5383 Other fatigue: Secondary | ICD-10-CM | POA: Diagnosis not present

## 2019-04-26 DIAGNOSIS — C7802 Secondary malignant neoplasm of left lung: Secondary | ICD-10-CM | POA: Insufficient documentation

## 2019-04-26 DIAGNOSIS — Z923 Personal history of irradiation: Secondary | ICD-10-CM

## 2019-04-26 DIAGNOSIS — Z9221 Personal history of antineoplastic chemotherapy: Secondary | ICD-10-CM

## 2019-04-26 DIAGNOSIS — Z170 Estrogen receptor positive status [ER+]: Secondary | ICD-10-CM | POA: Diagnosis not present

## 2019-04-26 LAB — CBC WITH DIFFERENTIAL (CANCER CENTER ONLY)
Abs Immature Granulocytes: 0.06 10*3/uL (ref 0.00–0.07)
Basophils Absolute: 0.1 10*3/uL (ref 0.0–0.1)
Basophils Relative: 1 %
Eosinophils Absolute: 0.2 10*3/uL (ref 0.0–0.5)
Eosinophils Relative: 4 %
HCT: 38.4 % (ref 36.0–46.0)
Hemoglobin: 12 g/dL (ref 12.0–15.0)
Immature Granulocytes: 1 %
Lymphocytes Relative: 15 %
Lymphs Abs: 0.8 10*3/uL (ref 0.7–4.0)
MCH: 30.8 pg (ref 26.0–34.0)
MCHC: 31.3 g/dL (ref 30.0–36.0)
MCV: 98.5 fL (ref 80.0–100.0)
Monocytes Absolute: 0.7 10*3/uL (ref 0.1–1.0)
Monocytes Relative: 14 %
Neutro Abs: 3.4 10*3/uL (ref 1.7–7.7)
Neutrophils Relative %: 65 %
Platelet Count: 182 10*3/uL (ref 150–400)
RBC: 3.9 MIL/uL (ref 3.87–5.11)
RDW: 15.6 % — ABNORMAL HIGH (ref 11.5–15.5)
WBC Count: 5.2 10*3/uL (ref 4.0–10.5)
nRBC: 0 % (ref 0.0–0.2)

## 2019-04-26 LAB — CMP (CANCER CENTER ONLY)
ALT: 140 U/L — ABNORMAL HIGH (ref 0–44)
AST: 110 U/L — ABNORMAL HIGH (ref 15–41)
Albumin: 3.3 g/dL — ABNORMAL LOW (ref 3.5–5.0)
Alkaline Phosphatase: 105 U/L (ref 38–126)
Anion gap: 12 (ref 5–15)
BUN: 19 mg/dL (ref 8–23)
CO2: 27 mmol/L (ref 22–32)
Calcium: 8.9 mg/dL (ref 8.9–10.3)
Chloride: 105 mmol/L (ref 98–111)
Creatinine: 0.84 mg/dL (ref 0.44–1.00)
GFR, Est AFR Am: >60 mL/min (ref >60)
GFR, Estimated: >60 mL/min (ref >60)
Glucose, Bld: 99 mg/dL (ref 70–99)
Potassium: 3.6 mmol/L (ref 3.5–5.1)
Sodium: 144 mmol/L (ref 135–145)
Total Bilirubin: 0.4 mg/dL (ref 0.3–1.2)
Total Protein: 7 g/dL (ref 6.5–8.1)

## 2019-04-26 MED ORDER — DEXAMETHASONE 0.5 MG/5ML PO SOLN: ORAL | 3 refills | Status: DC

## 2019-04-26 MED ORDER — ATORVASTATIN CALCIUM 20 MG PO TABS: 20.0000 mg | ORAL_TABLET | Freq: Every day | ORAL | 3 refills | Status: DC

## 2019-04-27 ENCOUNTER — Telehealth: Payer: Self-pay | Admitting: Hematology and Oncology

## 2019-04-27 NOTE — Telephone Encounter (Signed)
Talk with patient regarding visit

## 2019-05-03 MED FILL — AFINITOR 10 MG TABLET: 10 | 28 days supply | Qty: 28 | Fill #2

## 2019-05-04 ENCOUNTER — Ambulatory Visit (INDEPENDENT_AMBULATORY_CARE_PROVIDER_SITE_OTHER): Payer: Medicare Other | Admitting: Physician Assistant

## 2019-05-04 ENCOUNTER — Other Ambulatory Visit: Payer: Self-pay

## 2019-05-04 ENCOUNTER — Encounter: Payer: Self-pay | Admitting: Physician Assistant

## 2019-05-04 VITALS — BP 119/70 | HR 106 | Temp 96.4°F | Resp 16 | Wt 154.0 lb

## 2019-05-04 DIAGNOSIS — L509 Urticaria, unspecified: Secondary | ICD-10-CM

## 2019-05-04 DIAGNOSIS — W57XXXA Bitten or stung by nonvenomous insect and other nonvenomous arthropods, initial encounter: Secondary | ICD-10-CM | POA: Diagnosis not present

## 2019-05-04 MED ORDER — DOXYCYCLINE HYCLATE 100 MG PO CAPS: 100.0000 mg | ORAL_CAPSULE | Freq: Two times a day (BID) | ORAL | 0 refills | Status: DC

## 2019-05-04 NOTE — Progress Notes (Signed)
Virtual Visit via Video   I connected with patient on 05/04/19 at  9:30 AM EDT by a video enabled telemedicine application and verified that I am speaking with the correct person using two identifiers.  Location patient: Home Location provider: [facility], Office Persons participating in the virtual visit: Patient, Provider, [place] (Patina Moore)  I discussed the limitations of evaluation and management by telemedicine and the availability of in person appointments. The patient expressed understanding and agreed to proceed.  Subjective:   HPI:   Patient presents today via [facility] today for assessment of a tick bite with possible infection. Patient notes initially on Friday she noted a rash on her face that she thought was a sun rash. Notes later developing itchy raised areas of upper torso and forearms bilaterally with some involvement of upper thigh. Denies new medications or change in medication. Denies change in soaps, lotions or detergents. Noted tick last night on her left groin which she completely removed. Noted it was a larger tick and significantly swollen. Cleaned the area with alcohol and peroxide. Notes small redness in the area where was removed but denies bulls-eye rash. Denies fever, or aches. Some chills for a bit on Saturday but none since. Denies recent travel or sick contact. Has applied benadryl to the area with improvement in the itch and rash.   ROS:   See pertinent positives and negatives per HPI.  Patient Active Problem List   Diagnosis Date Noted  . Thrombocytopenia ([place]) 12/01/2018  . Liver metastases ([place]) 11/02/2018  . Acute diastolic CHF (congestive heart failure) ([place]) 09/01/2018  . Pleural effusion 09/01/2018  . Bone metastases ([place]) 05/27/2018  . Metastatic cancer to lung ([place]) 12/22/2017  . Streptococcal bacteremia 09/24/2017  . CKD (chronic kidney disease), stage III ([place]) 09/22/2017  . Sepsis ([place]) 09/22/2017  . Lower urinary tract  infectious disease 09/22/2017  . Chest pain 09/22/2017  . Left arm cellulitis 09/22/2017  . Obesity (BMI 30-39.9) 01/06/2014  . Hyperlipidemia 01/06/2014  . CAD S/P percutaneous coronary angioplasty   . Essential hypertension   . Carotid artery disease ([place])   . Malignant neoplasm of upper-outer quadrant of left breast in female, estrogen receptor positive ([place]) 10/29/2013    Social History   Tobacco Use  . Smoking status: Former Smoker    Packs/day: 0.30    Types: Cigarettes    Last attempt to quit: 11/09/1983    Years since quitting: 35.5  . Smokeless tobacco: Never Used  Substance Use Topics  . Alcohol use: Not Currently    Current Outpatient Medications:  .  acetaminophen (TYLENOL) 500 MG tablet, Take 1,000 mg by mouth every 6 (six) hours as needed for mild pain., Disp: , Rfl:  .  aspirin EC 81 MG tablet, Take 81 mg by mouth daily., Disp: , Rfl:  .  atorvastatin (LIPITOR) 20 MG tablet, Take 1 tablet (20 mg total) by mouth at bedtime., Disp: 90 tablet, Rfl: 3 .  Calcium Carb-Cholecalciferol (CALCIUM 600 + D PO), Take 1 tablet by mouth daily., Disp: , Rfl:  .  dexamethasone (DECADRON) 0.5 MG/5ML solution, Swish 29mL in mouth for 65min and spit out. Use 4 times daily for 8 weeks, start with Afinitor. Avoid eating/drinking for 1hr after rinse., Disp: 500 mL, Rfl: 3 .  everolimus (AFINITOR) 10 MG tablet, Take 1 tablet (10 mg total) by mouth daily., Disp: 30 tablet, Rfl: 3 .  exemestane (AROMASIN) 25 MG tablet, Take 1 tablet (25 mg total) by mouth daily after  breakfast., Disp: 90 tablet, Rfl: 3 .  hydrochlorothiazide (MICROZIDE) 12.5 MG capsule, Take 1 capsule (12.5 mg total) by mouth daily., Disp: 90 capsule, Rfl: 3 .  Multiple Vitamin (MULTIVITAMIN WITH MINERALS) TABS tablet, Take 1 tablet by mouth daily., Disp: , Rfl:  .  nitroGLYCERIN (NITROSTAT) 0.4 MG SL tablet, PLACE 1 TABLET (0.4 MG TOTAL) UNDER THE TONGUE EVERY 5 (FIVE) MINUTES AS NEEDED FOR CHEST PAIN., Disp: 25 tablet, Rfl: 0  .  ondansetron (ZOFRAN ODT) 8 MG disintegrating tablet, Take 1 tablet (8 mg total) by mouth every 8 (eight) hours as needed for nausea or vomiting., Disp: 30 tablet, Rfl: 6 .  oxyCODONE-acetaminophen (PERCOCET/ROXICET) 5-325 MG tablet, Take 1 tablet by mouth every 8 (eight) hours as needed for severe pain., Disp: 30 tablet, Rfl: 0  No Known Allergies  Objective:   BP 119/70   Pulse (!) 106   Temp (!) 96.4 F (35.8 C) (Oral)   Resp 16   Wt 154 lb (69.9 kg)   BMI 23.42 kg/m   Patient is well-developed, well-nourished in no acute distress.  Resting comfortably at home.  Head is normocephalic, atraumatic.  No labored breathing.  Speech is clear and coherent with logical contest.  Patient is alert and oriented at baseline.  Large wheals noted of upper chest and forearms bilaterally, with some areas of confluence.  Assessment and Plan:   1. Tick bite, initial encounter Question length of attachment. Potentially 3-4 days. No fever, chills or aches. No rash of erythema migrans noted or symptoms of RMSF present. Will start on Doxycycline 100 mg BID x 10 days. May extend if new symptoms develop in the next 24-48 hours. Consider testing for Lyme in 2 weeks to give time for Ab to develop.  2. Urticaria Of tichest, neck and upper extremities and lower legs, mainly in areas of sunlight exposure. Improvement already per paitent with benadryl. Will have her continue Benadryl every 4-6 hours. Is on Decadron. Consider H2 blocker. Will follow-up with patient in 48 hours for reassessment of this.    Leeanne Rio, [name] 05/04/2019

## 2019-05-04 NOTE — Progress Notes (Signed)
I have discussed the procedure for the virtual visit with the patient who has given consent to proceed with assessment and treatment.   Wendy [name] Wendy [name], CMA     

## 2019-05-20 ENCOUNTER — Other Ambulatory Visit: Payer: Self-pay | Admitting: Hematology and Oncology

## 2019-06-01 MED FILL — EXEMESTANE 25 MG TABS: 25 | 90 days supply | Qty: 90 | Fill #1

## 2019-06-01 MED FILL — AFINITOR 10 MG TABLET: 10 | 28 days supply | Qty: 28 | Fill #3

## 2019-06-07 ENCOUNTER — Other Ambulatory Visit: Payer: Medicare Other

## 2019-06-08 ENCOUNTER — Encounter (HOSPITAL_COMMUNITY): Payer: Self-pay

## 2019-06-08 ENCOUNTER — Ambulatory Visit (HOSPITAL_COMMUNITY)
Admission: RE | Admit: 2019-06-08 | Discharge: 2019-06-08 | Disposition: A | Discharge status: Home / Self Care | Payer: Medicare Other | Source: Ambulatory Visit | Attending: Hematology and Oncology | Admitting: Hematology and Oncology

## 2019-06-08 ENCOUNTER — Other Ambulatory Visit: Payer: Self-pay

## 2019-06-08 ENCOUNTER — Telehealth: Payer: Medicare Other | Admitting: Hematology and Oncology

## 2019-06-08 ENCOUNTER — Inpatient Hospital Stay: Payer: Medicare Other | Attending: Hematology and Oncology

## 2019-06-08 DIAGNOSIS — C779 Secondary and unspecified malignant neoplasm of lymph node, unspecified: Secondary | ICD-10-CM | POA: Insufficient documentation

## 2019-06-08 DIAGNOSIS — C50412 Malignant neoplasm of upper-outer quadrant of left female breast: Secondary | ICD-10-CM

## 2019-06-08 DIAGNOSIS — C7800 Secondary malignant neoplasm of unspecified lung: Secondary | ICD-10-CM

## 2019-06-08 DIAGNOSIS — Z9221 Personal history of antineoplastic chemotherapy: Secondary | ICD-10-CM | POA: Insufficient documentation

## 2019-06-08 DIAGNOSIS — C7802 Secondary malignant neoplasm of left lung: Secondary | ICD-10-CM | POA: Diagnosis not present

## 2019-06-08 DIAGNOSIS — C7801 Secondary malignant neoplasm of right lung: Secondary | ICD-10-CM | POA: Insufficient documentation

## 2019-06-08 DIAGNOSIS — E876 Hypokalemia: Secondary | ICD-10-CM | POA: Diagnosis not present

## 2019-06-08 DIAGNOSIS — Z923 Personal history of irradiation: Secondary | ICD-10-CM | POA: Diagnosis not present

## 2019-06-08 DIAGNOSIS — R197 Diarrhea, unspecified: Secondary | ICD-10-CM | POA: Insufficient documentation

## 2019-06-08 DIAGNOSIS — Z170 Estrogen receptor positive status [ER+]: Secondary | ICD-10-CM | POA: Insufficient documentation

## 2019-06-08 DIAGNOSIS — C7951 Secondary malignant neoplasm of bone: Secondary | ICD-10-CM | POA: Insufficient documentation

## 2019-06-08 DIAGNOSIS — Z79811 Long term (current) use of aromatase inhibitors: Secondary | ICD-10-CM | POA: Diagnosis not present

## 2019-06-08 DIAGNOSIS — I348 Other nonrheumatic mitral valve disorders: Secondary | ICD-10-CM | POA: Diagnosis not present

## 2019-06-08 DIAGNOSIS — I700 Atherosclerosis of aorta: Secondary | ICD-10-CM | POA: Diagnosis not present

## 2019-06-08 DIAGNOSIS — Z9012 Acquired absence of left breast and nipple: Secondary | ICD-10-CM | POA: Diagnosis not present

## 2019-06-08 DIAGNOSIS — R5383 Other fatigue: Secondary | ICD-10-CM | POA: Insufficient documentation

## 2019-06-08 DIAGNOSIS — J90 Pleural effusion, not elsewhere classified: Secondary | ICD-10-CM | POA: Diagnosis not present

## 2019-06-08 DIAGNOSIS — Z79899 Other long term (current) drug therapy: Secondary | ICD-10-CM | POA: Insufficient documentation

## 2019-06-08 DIAGNOSIS — C787 Secondary malignant neoplasm of liver and intrahepatic bile duct: Secondary | ICD-10-CM | POA: Diagnosis not present

## 2019-06-08 HISTORY — DX: Secondary malignant neoplasm of unspecified site: C79.9

## 2019-06-08 LAB — CMP (CANCER CENTER ONLY)
ALT: 26 U/L (ref 0–44)
AST: 44 U/L — ABNORMAL HIGH (ref 15–41)
Albumin: 3.4 g/dL — ABNORMAL LOW (ref 3.5–5.0)
Alkaline Phosphatase: 70 U/L (ref 38–126)
Anion gap: 13 (ref 5–15)
BUN: 23 mg/dL (ref 8–23)
CO2: 24 mmol/L (ref 22–32)
Calcium: 8.8 mg/dL — ABNORMAL LOW (ref 8.9–10.3)
Chloride: 103 mmol/L (ref 98–111)
Creatinine: 1.1 mg/dL — ABNORMAL HIGH (ref 0.44–1.00)
GFR, Est AFR Am: 56 mL/min — ABNORMAL LOW (ref >60)
GFR, Estimated: 48 mL/min — ABNORMAL LOW (ref >60)
Glucose, Bld: 98 mg/dL (ref 70–99)
Potassium: 3.1 mmol/L — ABNORMAL LOW (ref 3.5–5.1)
Sodium: 140 mmol/L (ref 135–145)
Total Bilirubin: 0.8 mg/dL (ref 0.3–1.2)
Total Protein: 7 g/dL (ref 6.5–8.1)

## 2019-06-08 LAB — CBC WITH DIFFERENTIAL (CANCER CENTER ONLY)
Abs Immature Granulocytes: 0.03 10*3/uL (ref 0.00–0.07)
Basophils Absolute: 0.1 10*3/uL (ref 0.0–0.1)
Basophils Relative: 1 %
Eosinophils Absolute: 0.9 10*3/uL — ABNORMAL HIGH (ref 0.0–0.5)
Eosinophils Relative: 12 %
HCT: 35.4 % — ABNORMAL LOW (ref 36.0–46.0)
Hemoglobin: 11.3 g/dL — ABNORMAL LOW (ref 12.0–15.0)
Immature Granulocytes: 0 %
Lymphocytes Relative: 11 %
Lymphs Abs: 0.8 10*3/uL (ref 0.7–4.0)
MCH: 27.5 pg (ref 26.0–34.0)
MCHC: 31.9 g/dL (ref 30.0–36.0)
MCV: 86.1 fL (ref 80.0–100.0)
Monocytes Absolute: 0.9 10*3/uL (ref 0.1–1.0)
Monocytes Relative: 12 %
Neutro Abs: 4.6 10*3/uL (ref 1.7–7.7)
Neutrophils Relative %: 64 %
Platelet Count: 233 10*3/uL (ref 150–400)
RBC: 4.11 MIL/uL (ref 3.87–5.11)
RDW: 17.4 % — ABNORMAL HIGH (ref 11.5–15.5)
WBC Count: 7.3 10*3/uL (ref 4.0–10.5)
nRBC: 0 % (ref 0.0–0.2)

## 2019-06-08 MED ORDER — SODIUM CHLORIDE (PF) 0.9 % IJ SOLN
INTRAMUSCULAR | Status: AC
  Filled 2019-06-08: qty 50

## 2019-06-08 MED ORDER — IOHEXOL 300 MG/ML  SOLN
100.0000 mL | Freq: Once | INTRAMUSCULAR | Status: AC | PRN
  Administered 2019-06-08: 10:00:00 100 mL via INTRAVENOUS

## 2019-06-08 NOTE — Progress Notes (Signed)
Patient Care Team: Delorse Limber as PCP - General (Family Medicine)  DIAGNOSIS:    ICD-10-CM   1. Malignant neoplasm metastatic to lung, unspecified laterality ([place])  C78.00 US BIOPSY (LIVER)    CBC with Differential ([place])    CMP ([place] only)  2. Malignant neoplasm of upper-outer quadrant of left breast in female, estrogen receptor positive ([place])  C50.412 US BIOPSY (LIVER)   Z17.0 CBC with Differential ([place])    CMP ([place] only)    SUMMARY OF ONCOLOGIC HISTORY: Oncology History  Malignant neoplasm of upper-outer quadrant of left breast in female, estrogen receptor positive ([place])  07/28/2013 Mammogram   Spiculated mass at 6:00 position 3 cm, ultrasound revealed 2.6 x 2.1 x 1.5 cm second mass at 12:00 2.1 x 1.8 x 2.1 cm: 2 abnormal lymph nodes left axilla   10/29/2013 Initial Diagnosis   Breast cancer of upper-outer quadrant of left female breast: 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 [name], T3N2 (5/10 LN positive) stage IIIa IDC grade 1, ER/PR 100%, TSH 14% HER-2 negative PET/CT 12/20/2013 negative for metastases   12/17/2013 - 04/02/2014 Chemotherapy   Taxotere Cytoxan every 3 weeks for 6 cycles   05/11/2014 - 06/27/2014 Radiation Therapy   Adjuvant radiation therapy to chest wall and lymph nodes   07/06/2014 -  Anti-estrogen oral therapy   Arimidex 1 mg daily   12/21/2017 Imaging   Metastatic lesions noted in the lungs bilaterally, bilateral hilar and mediastinal lymph nodes, left pleural effusion, multiple thoracic vertebral metastases, heterogeneous lesions in the liver   12/22/2017 Procedure   Pleural Effusion Thoracentesis: Metastatic Breast cancer ER PR Positive Her 2 Neg   12/29/2017 Treatment Plan Change   Ibrance, Faslodex and Xgeva   03/09/2019 Treatment Plan Change   Everolimus and Exemestane     CHIEF COMPLIANT: Follow-up  oneverolimus and exemestane to discuss scans   INTERVAL HISTORY: Wendy [name] is a 78 y.o. with above-mentioned history of metastatic breast cancer currently on treatment witheverolimus and exemestane. CT CAP on 06/08/19 showed progression of the hepatic metastases from 5.9cm to 6.4cm and from 5.2cm to 5.8cm. There were stable osseous metastases. Labs showed: WBC 7.3, Hg 11.3, HCT 35.4, platelets 233, AST 44, ALT 26. She presents to the clinic today for a toxicity check and to discuss her scans.  She denies any mucositis.  She had a very diarrheal stool after drinking contrast because of that she is hypokalemic today. Continues to have moderate fatigue.  Denies any cough shortness of breath  REVIEW OF SYSTEMS:   Constitutional: Denies fevers, chills or abnormal weight loss Eyes: Denies blurriness of vision Ears, nose, mouth, throat, and face: Denies mucositis or sore throat Respiratory: Denies cough, dyspnea or wheezes Cardiovascular: Denies palpitation, chest discomfort Gastrointestinal: Denies nausea, heartburn or change in bowel habits Skin: Denies abnormal skin rashes Lymphatics: Denies new lymphadenopathy or easy bruising Neurological: Denies numbness, tingling or new weaknesses Behavioral/Psych: Mood is stable, no new changes  Extremities: No lower extremity edema Breast: denies any pain or lumps or nodules in either breasts All other systems were reviewed with the patient and are negative.  I have reviewed the past medical history, past surgical history, social history and family history with the patient and they are unchanged from previous note.  ALLERGIES:  has No Known Allergies.  MEDICATIONS:  Current Outpatient Medications  Medication Sig Dispense Refill  .  acetaminophen (TYLENOL) 500 MG tablet Take 1,000 mg by mouth every 6 (six) hours as needed for mild pain.    [name] aspirin EC 81 MG tablet Take 81 mg by mouth daily.    [name] atorvastatin (LIPITOR) 20 MG tablet Take 1 tablet  (20 mg total) by mouth at bedtime. 90 tablet 3  . Calcium Carb-Cholecalciferol (CALCIUM 600 + D PO) Take 1 tablet by mouth daily.    [name] dexamethasone (DECADRON) 0.5 MG/5ML solution Swish 50m in mouth for 276m and spit out. Use 4 times daily for 8 weeks, start with Afinitor. Avoid eating/drinking for 1hr after rinse. 500 mL 3  . dexamethasone (DECADRON) 1 MG tablet TAKE 1 TABLET BY MOUTH EVERY DAY 30 tablet 0  . doxycycline (VIBRAMYCIN) 100 MG capsule Take 1 capsule (100 mg total) by mouth 2 (two) times daily. 20 capsule 0  . everolimus (AFINITOR) 10 MG tablet Take 1 tablet (10 mg total) by mouth daily. 30 tablet 3  . exemestane (AROMASIN) 25 MG tablet Take 1 tablet (25 mg total) by mouth daily after breakfast. 90 tablet 3  . hydrochlorothiazide (MICROZIDE) 12.5 MG capsule Take 1 capsule (12.5 mg total) by mouth daily. 90 capsule 3  . Multiple Vitamin (MULTIVITAMIN WITH MINERALS) TABS tablet Take 1 tablet by mouth daily.    . nitroGLYCERIN (NITROSTAT) 0.4 MG SL tablet PLACE 1 TABLET (0.4 MG TOTAL) UNDER THE TONGUE EVERY 5 (FIVE) MINUTES AS NEEDED FOR CHEST PAIN. 25 tablet 0  . ondansetron (ZOFRAN ODT) 8 MG disintegrating tablet Take 1 tablet (8 mg total) by mouth every 8 (eight) hours as needed for nausea or vomiting. 30 tablet 6  . oxyCODONE-acetaminophen (PERCOCET/ROXICET) 5-325 MG tablet Take 1 tablet by mouth every 8 (eight) hours as needed for severe pain. 30 tablet 0   No current facility-administered medications for this visit.     PHYSICAL EXAMINATION: ECOG PERFORMANCE STATUS: 1 - Symptomatic but completely ambulatory  Vitals:   06/09/19 0936  BP: (!) 126/54  Pulse: 99  Resp: 17  Temp: 98.7 F (37.1 C)  SpO2: 97%   Filed Weights   06/09/19 0936  Weight: 156 lb 1.6 oz (70.8 kg)    GENERAL: alert, no distress and comfortable SKIN: skin color, texture, turgor are normal, no rashes or significant lesions EYES: normal, Conjunctiva are pink and non-injected, sclera clear  OROPHARYNX: no exudate, no erythema and lips, buccal mucosa, and tongue normal  NECK: supple, thyroid normal size, non-tender, without nodularity LYMPH: no palpable lymphadenopathy in the cervical, axillary or inguinal LUNGS: clear to auscultation and percussion with normal breathing effort HEART: regular rate & rhythm and no murmurs and no lower extremity edema ABDOMEN: abdomen soft, non-tender and normal bowel sounds MUSCULOSKELETAL: no cyanosis of digits and no clubbing  NEURO: alert & oriented x 3 with fluent speech, no focal motor/sensory deficits EXTREMITIES: No lower extremity edema  LABORATORY DATA:  I have reviewed the data as listed CMP Latest Ref Rng & Units 06/08/2019 04/26/2019 03/29/2019  Glucose 70 - 99 mg/dL 98 99 93  BUN 8 - 23 mg/dL 23 19 20   Creatinine 0.44 - 1.00 mg/dL 1.10(H) 0.84 0.91  Sodium 135 - 145 mmol/L 140 144 144  Potassium 3.5 - 5.1 mmol/L 3.1(L) 3.6 3.7  Chloride 98 - 111 mmol/L 103 105 107  CO2 22 - 32 mmol/L 24 27 26   Calcium 8.9 - 10.3 mg/dL 8.8(L) 8.9 8.7(L)  Total Protein 6.5 - 8.1 g/dL 7.0 7.0 6.5  Total Bilirubin 0.3 - 1.2  mg/dL 0.8 0.4 0.3  Alkaline Phos 38 - 126 U/L 70 105 52  AST 15 - 41 U/L 44(H) 110(H) 32  ALT 0 - 44 U/L 26 140(H) 23    Lab Results  Component Value Date   WBC 7.3 06/08/2019   HGB 11.3 (L) 06/08/2019   HCT 35.4 (L) 06/08/2019   MCV 86.1 06/08/2019   PLT 233 06/08/2019   NEUTROABS 4.6 06/08/2019    ASSESSMENT & PLAN:  Malignant neoplasm of upper-outer quadrant of left breast in female, 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 She had radiation therapy and has been on antiestrogen therapy with Arimidex 06/28/2014-January 2019  Thoracentesis: Met Breast cancer ER PR Positive and Her 2 Neg PET/CT scan. 01/04/18: New malignant Pleural  effusion with small bil pulm nodules,new mild bilateral mediastinal hilar portocaval lymph node metastases, new liver metastases, new diffuse bone metastases.  Ibrance with Faslodexstarted 12/29/2017-02/27/2019  --------------------------------------------------------------------------- Current treatment:Exemestane with everolimus started 03/09/2019  Toxicities:Denies any mouth sores or diarrhea. Overall she appears to be tolerating the treatment very well. Major side effect is fatigue CT chest abdomen pelvis: Overall hepatic metastatic burden has mildly worsened 5.9 cm is now 6.4 cm another lesion 5.2 cm is now 5.8 cm.  Primary lytic bone lesions are stable.  Stable left thoracic volume loss and left pleural effusion.  Increased airway plugging in left lower lobe.  Subcarinal and AP window lymph nodes stable  Treatment plan: 1.  Obtain biopsy of the liver to send for prognostic panel as well as [facility] molecular testing 2.  Follow-up in 1 month to discuss the results of molecular testing and decide if any other treatment plan may be better.  Strictly based on progression criteria the increase in the liver metastases does not yet qualify as progressive disease.  We will continue with the same treatment for the time being and may change to a different treatment based upon molecular test results.    Orders Placed This Encounter  Procedures  . US BIOPSY (LIVER)    Standing Status:   Future    Standing Expiration Date:   08/09/2020    Order Specific Question:   Lab orders requested (DO NOT place separate lab orders, these will be automatically ordered during procedure specimen collection):    Answer:   Surgical Pathology    Order Specific Question:   Reason for Exam (SYMPTOM  OR DIAGNOSIS REQUIRED)    Answer:   Liver mets needs biopsy    Order Specific Question:   Preferred location?    Answer:   [facility]  . CBC with Differential ([place] Only)    Standing Status:    Future    Standing Expiration Date:   06/08/2020  . CMP ([place] only)    Standing Status:   Future    Standing Expiration Date:   06/08/2020   The patient has a good understanding of the overall plan. she agrees with it. she will call with any problems that may develop before the next visit here.  [name] Lose, MD 06/09/2019  Julious Oka Dorshimer am acting as scribe for Dr. [name] Lose.  I have reviewed the above documentation for accuracy and completeness, and I agree with the above.

## 2019-06-09 ENCOUNTER — Other Ambulatory Visit: Payer: Self-pay

## 2019-06-09 ENCOUNTER — Inpatient Hospital Stay (HOSPITAL_BASED_OUTPATIENT_CLINIC_OR_DEPARTMENT_OTHER): Payer: Medicare Other | Admitting: Hematology and Oncology

## 2019-06-09 VITALS — BP 126/54 | HR 99 | Temp 98.7°F | Resp 17 | Ht 68.0 in | Wt 156.1 lb

## 2019-06-09 DIAGNOSIS — C7802 Secondary malignant neoplasm of left lung: Secondary | ICD-10-CM

## 2019-06-09 DIAGNOSIS — Z923 Personal history of irradiation: Secondary | ICD-10-CM

## 2019-06-09 DIAGNOSIS — R5383 Other fatigue: Secondary | ICD-10-CM

## 2019-06-09 DIAGNOSIS — Z9012 Acquired absence of left breast and nipple: Secondary | ICD-10-CM

## 2019-06-09 DIAGNOSIS — C50412 Malignant neoplasm of upper-outer quadrant of left female breast: Secondary | ICD-10-CM | POA: Diagnosis not present

## 2019-06-09 DIAGNOSIS — C7800 Secondary malignant neoplasm of unspecified lung: Secondary | ICD-10-CM

## 2019-06-09 DIAGNOSIS — Z9221 Personal history of antineoplastic chemotherapy: Secondary | ICD-10-CM

## 2019-06-09 DIAGNOSIS — C7801 Secondary malignant neoplasm of right lung: Secondary | ICD-10-CM

## 2019-06-09 DIAGNOSIS — Z79811 Long term (current) use of aromatase inhibitors: Secondary | ICD-10-CM

## 2019-06-09 DIAGNOSIS — C787 Secondary malignant neoplasm of liver and intrahepatic bile duct: Secondary | ICD-10-CM | POA: Diagnosis not present

## 2019-06-09 DIAGNOSIS — C7951 Secondary malignant neoplasm of bone: Secondary | ICD-10-CM

## 2019-06-09 DIAGNOSIS — R197 Diarrhea, unspecified: Secondary | ICD-10-CM | POA: Diagnosis not present

## 2019-06-09 DIAGNOSIS — Z170 Estrogen receptor positive status [ER+]: Secondary | ICD-10-CM

## 2019-06-09 DIAGNOSIS — Z79899 Other long term (current) drug therapy: Secondary | ICD-10-CM

## 2019-06-09 DIAGNOSIS — C779 Secondary and unspecified malignant neoplasm of lymph node, unspecified: Secondary | ICD-10-CM

## 2019-06-09 DIAGNOSIS — E876 Hypokalemia: Secondary | ICD-10-CM

## 2019-06-09 NOTE — Assessment & Plan Note (Signed)
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 She had radiation therapy and has been on antiestrogen therapy with Arimidex 06/28/2014-January 2019  Thoracentesis: Met Breast cancer ER PR Positive and Her 2 Neg PET/CT scan. 01/04/18: New malignant Pleural effusion with small bil pulm nodules,new mild bilateral mediastinal hilar portocaval lymph node metastases, new liver metastases, new diffuse bone metastases.  Ibrance with Faslodexstarted 12/29/2017-02/27/2019  --------------------------------------------------------------------------- Current treatment:Exemestane with everolimus started 03/09/2019  Toxicities:Denies any mouth sores or diarrhea. Overall she appears to be tolerating the treatment very well. Major side effect is fatigue CT chest abdomen pelvis: Overall hepatic metastatic burden has mildly worsened 5.9 cm is now 6.4 cm another lesion 5.2 cm is now 5.8 cm.  Primary lytic bone lesions are stable.  Stable left thoracic volume loss and left pleural effusion.  Increased airway plugging in left lower lobe.  Subcarinal and AP window lymph nodes stable  Treatment plan:

## 2019-06-10 ENCOUNTER — Telehealth: Payer: Self-pay | Admitting: Hematology and Oncology

## 2019-06-10 NOTE — Telephone Encounter (Signed)
I talk with patient regarding schedule  

## 2019-06-21 ENCOUNTER — Ambulatory Visit (HOSPITAL_COMMUNITY): Payer: Medicare Other

## 2019-06-21 ENCOUNTER — Telehealth: Payer: Self-pay

## 2019-06-21 NOTE — Telephone Encounter (Signed)
Spoke with patient regarding after hours call.  Patient called on 06/20/2019 reporting hands red, swollen and stinging x 20 minutes.  Patient states all symptoms have resolved (took Aspirin as advised from triage nurse), had taken Benadryl with AM meds.  Advised to call back with issues.    [place] RECORD AccessNurse Patient Name: Wendy [name] Gender: Female DOB: Sep 25, 1941 Age: 78 Y 11 M 13 D Return Phone Number: [phone] (Primary), [phone] (Secondary) Address[place] [address] Client [facility] [name] Night - C Client Site [place] Physician AA - PHYSICIAN, NOT LISTED- MD Contact Type Call Who Is Calling Patient / Member / Family / Caregiver Call Type Triage / Clinical Caller Name Cato Mulligan [facility]) Relationship To Patient Friend Return Phone Number [phone] (Primary) Chief Complaint Hand Pain Reason for Call Symptomatic / Request for [place] states pt says both of her hands are red and feels like a 1000 bees stinging her, knuckles are swelling. She sees Elyn Aquas, NP, location verified Translation No Nurse Assessment Nurse: Hardin Negus, RN, Mardene Celeste Date/Time Eilene Ghazi Time): 06/20/2019 2:58:00 PM Confirm and document reason for call. If symptomatic, describe symptoms. ---Caller states pt says both of her hands are red and feels like a 1000 bees stinging her, knuckles are swelling. She reports the stinging sensation is gone. She is not having any pain. Has the patient had close contact with a person known or suspected to have the novel coronavirus illness OR traveled / lives in area with major community spread (including international travel) in the last 14 days from the onset of symptoms? * If Asymptomatic, screen for exposure and travel within the last 14 days. ---No Does  the patient have any new or worsening symptoms? ---Yes Will a triage be completed? ---Yes Related visit to physician within the last 2 weeks? ---No Does the PT have any chronic conditions? (i.e. diabetes, asthma, this includes High risk factors for pregnancy, etc.) ---Yes List chronic conditions. ---stage 4 breast cancer Is this a behavioral health or substance abuse call? ---No Guidelines Guideline Title Affirmed Question Affirmed Notes Nurse Date/Time Eilene Ghazi Time) Hand and Wrist Pain Swollen joint of new onset Oren Bracket 06/20/2019 3:00:15 PM PLEASE NOTE: All timestamps contained within this report are represented as [place] Standard Time. CONFIDENTIALTY NOTICE: This fax transmission is intended only for the addressee. It contains information that is legally privileged, confidential or otherwise protected from use or disclosure. If you are not the intended recipient, you are strictly prohibited from reviewing, disclosing, copying using or disseminating any of this information or taking any action in reliance on or regarding this information. If you have received this fax in error, please notify us immediately by telephone so that we can arrange for its return to [place]. Phone: [phone], Toll-Free: [phone], Fax: [phone] Page: 2 of 2 Call Id: [id number] [place]. Time Eilene Ghazi Time) Disposition Final User 06/20/2019 3:03:01 PM SEE PCP WITHIN 3 DAYS Yes Hardin Negus, RN, [place] Disagree/Comply Comply Caller Understands Yes PreDisposition Call Doctor Care Advice Given Per Guideline CALL BACK IF: * Fever occurs * You become worse. CARE ADVICE given per Hand and Wrist Pain (Adult) guideline. Referrals REFERRED TO PCP OFFICE

## 2019-06-22 ENCOUNTER — Other Ambulatory Visit (HOSPITAL_COMMUNITY)
Admission: RE | Admit: 2019-06-22 | Discharge: 2019-06-22 | Disposition: A | Discharge status: Home / Self Care | Payer: Medicare Other | Source: Ambulatory Visit | Attending: Hematology and Oncology | Admitting: Hematology and Oncology

## 2019-06-22 DIAGNOSIS — Z20828 Contact with and (suspected) exposure to other viral communicable diseases: Secondary | ICD-10-CM | POA: Insufficient documentation

## 2019-06-23 LAB — SARS CORONAVIRUS 2 (TAT 6-24 HRS): SARS Coronavirus 2: NEGATIVE

## 2019-06-24 ENCOUNTER — Other Ambulatory Visit: Payer: Self-pay | Admitting: Radiology

## 2019-06-25 ENCOUNTER — Other Ambulatory Visit: Payer: Self-pay | Admitting: *Deleted

## 2019-06-25 ENCOUNTER — Ambulatory Visit (HOSPITAL_COMMUNITY)
Admission: RE | Admit: 2019-06-25 | Discharge: 2019-06-25 | Disposition: A | Discharge status: Home / Self Care | Payer: Medicare Other | Source: Ambulatory Visit | Attending: Hematology and Oncology | Admitting: Hematology and Oncology

## 2019-06-25 ENCOUNTER — Other Ambulatory Visit: Payer: Self-pay | Admitting: Hematology and Oncology

## 2019-06-25 ENCOUNTER — Other Ambulatory Visit: Payer: Self-pay

## 2019-06-25 ENCOUNTER — Encounter (HOSPITAL_COMMUNITY): Payer: Self-pay

## 2019-06-25 DIAGNOSIS — C787 Secondary malignant neoplasm of liver and intrahepatic bile duct: Secondary | ICD-10-CM | POA: Insufficient documentation

## 2019-06-25 DIAGNOSIS — C7951 Secondary malignant neoplasm of bone: Secondary | ICD-10-CM | POA: Insufficient documentation

## 2019-06-25 DIAGNOSIS — C771 Secondary and unspecified malignant neoplasm of intrathoracic lymph nodes: Secondary | ICD-10-CM | POA: Diagnosis not present

## 2019-06-25 DIAGNOSIS — C50412 Malignant neoplasm of upper-outer quadrant of left female breast: Secondary | ICD-10-CM

## 2019-06-25 DIAGNOSIS — Z9012 Acquired absence of left breast and nipple: Secondary | ICD-10-CM | POA: Insufficient documentation

## 2019-06-25 DIAGNOSIS — Z90710 Acquired absence of both cervix and uterus: Secondary | ICD-10-CM | POA: Diagnosis not present

## 2019-06-25 DIAGNOSIS — I252 Old myocardial infarction: Secondary | ICD-10-CM | POA: Diagnosis not present

## 2019-06-25 DIAGNOSIS — R0609 Other forms of dyspnea: Secondary | ICD-10-CM | POA: Insufficient documentation

## 2019-06-25 DIAGNOSIS — Z961 Presence of intraocular lens: Secondary | ICD-10-CM | POA: Diagnosis not present

## 2019-06-25 DIAGNOSIS — I10 Essential (primary) hypertension: Secondary | ICD-10-CM | POA: Diagnosis not present

## 2019-06-25 DIAGNOSIS — M1990 Unspecified osteoarthritis, unspecified site: Secondary | ICD-10-CM | POA: Insufficient documentation

## 2019-06-25 DIAGNOSIS — Z7982 Long term (current) use of aspirin: Secondary | ICD-10-CM | POA: Insufficient documentation

## 2019-06-25 DIAGNOSIS — C7800 Secondary malignant neoplasm of unspecified lung: Secondary | ICD-10-CM

## 2019-06-25 DIAGNOSIS — C7801 Secondary malignant neoplasm of right lung: Secondary | ICD-10-CM | POA: Diagnosis not present

## 2019-06-25 DIAGNOSIS — J90 Pleural effusion, not elsewhere classified: Secondary | ICD-10-CM | POA: Diagnosis not present

## 2019-06-25 DIAGNOSIS — I2510 Atherosclerotic heart disease of native coronary artery without angina pectoris: Secondary | ICD-10-CM | POA: Diagnosis not present

## 2019-06-25 DIAGNOSIS — C50912 Malignant neoplasm of unspecified site of left female breast: Secondary | ICD-10-CM | POA: Diagnosis not present

## 2019-06-25 DIAGNOSIS — E785 Hyperlipidemia, unspecified: Secondary | ICD-10-CM | POA: Diagnosis not present

## 2019-06-25 DIAGNOSIS — Z905 Acquired absence of kidney: Secondary | ICD-10-CM | POA: Insufficient documentation

## 2019-06-25 DIAGNOSIS — Z170 Estrogen receptor positive status [ER+]: Secondary | ICD-10-CM | POA: Diagnosis not present

## 2019-06-25 DIAGNOSIS — I739 Peripheral vascular disease, unspecified: Secondary | ICD-10-CM | POA: Insufficient documentation

## 2019-06-25 DIAGNOSIS — Z9842 Cataract extraction status, left eye: Secondary | ICD-10-CM | POA: Insufficient documentation

## 2019-06-25 DIAGNOSIS — K769 Liver disease, unspecified: Secondary | ICD-10-CM | POA: Diagnosis not present

## 2019-06-25 DIAGNOSIS — Z79899 Other long term (current) drug therapy: Secondary | ICD-10-CM | POA: Diagnosis not present

## 2019-06-25 DIAGNOSIS — C782 Secondary malignant neoplasm of pleura: Secondary | ICD-10-CM | POA: Diagnosis not present

## 2019-06-25 DIAGNOSIS — Z9841 Cataract extraction status, right eye: Secondary | ICD-10-CM | POA: Diagnosis not present

## 2019-06-25 DIAGNOSIS — Z79811 Long term (current) use of aromatase inhibitors: Secondary | ICD-10-CM | POA: Insufficient documentation

## 2019-06-25 DIAGNOSIS — Z853 Personal history of malignant neoplasm of breast: Secondary | ICD-10-CM | POA: Insufficient documentation

## 2019-06-25 DIAGNOSIS — C50919 Malignant neoplasm of unspecified site of unspecified female breast: Secondary | ICD-10-CM | POA: Diagnosis not present

## 2019-06-25 DIAGNOSIS — C7802 Secondary malignant neoplasm of left lung: Secondary | ICD-10-CM | POA: Diagnosis not present

## 2019-06-25 LAB — CBC WITH DIFFERENTIAL/PLATELET
Abs Immature Granulocytes: 0.03 10*3/uL (ref 0.00–0.07)
Basophils Absolute: 0.1 10*3/uL (ref 0.0–0.1)
Basophils Relative: 1 %
Eosinophils Absolute: 0.4 10*3/uL (ref 0.0–0.5)
Eosinophils Relative: 7 %
HCT: 26.6 % — ABNORMAL LOW (ref 36.0–46.0)
Hemoglobin: 8.1 g/dL — ABNORMAL LOW (ref 12.0–15.0)
Immature Granulocytes: 1 %
Lymphocytes Relative: 14 %
Lymphs Abs: 0.7 10*3/uL (ref 0.7–4.0)
MCH: 26.6 pg (ref 26.0–34.0)
MCHC: 30.5 g/dL (ref 30.0–36.0)
MCV: 87.5 fL (ref 80.0–100.0)
Monocytes Absolute: 0.7 10*3/uL (ref 0.1–1.0)
Monocytes Relative: 13 %
Neutro Abs: 3.4 10*3/uL (ref 1.7–7.7)
Neutrophils Relative %: 64 %
Platelets: 264 10*3/uL (ref 150–400)
RBC: 3.04 MIL/uL — ABNORMAL LOW (ref 3.87–5.11)
RDW: 17.7 % — ABNORMAL HIGH (ref 11.5–15.5)
WBC: 5.2 10*3/uL (ref 4.0–10.5)
nRBC: 0 % (ref 0.0–0.2)

## 2019-06-25 LAB — COMPREHENSIVE METABOLIC PANEL
ALT: 20 U/L (ref 0–44)
AST: 33 U/L (ref 15–41)
Albumin: 3.6 g/dL (ref 3.5–5.0)
Alkaline Phosphatase: 56 U/L (ref 38–126)
Anion gap: 10 (ref 5–15)
BUN: 14 mg/dL (ref 8–23)
CO2: 26 mmol/L (ref 22–32)
Calcium: 8.9 mg/dL (ref 8.9–10.3)
Chloride: 104 mmol/L (ref 98–111)
Creatinine, Ser: 1.1 mg/dL — ABNORMAL HIGH (ref 0.44–1.00)
GFR calc Af Amer: 56 mL/min — ABNORMAL LOW (ref >60)
GFR calc non Af Amer: 48 mL/min — ABNORMAL LOW (ref >60)
Glucose, Bld: 102 mg/dL — ABNORMAL HIGH (ref 70–99)
Potassium: 2.9 mmol/L — ABNORMAL LOW (ref 3.5–5.1)
Sodium: 140 mmol/L (ref 135–145)
Total Bilirubin: 0.6 mg/dL (ref 0.3–1.2)
Total Protein: 6.9 g/dL (ref 6.5–8.1)

## 2019-06-25 LAB — PROTIME-INR
INR: 1 (ref 0.8–1.2)
Prothrombin Time: 12.9 seconds (ref 11.4–15.2)

## 2019-06-25 LAB — PREPARE RBC (CROSSMATCH)

## 2019-06-25 MED ORDER — MIDAZOLAM HCL 2 MG/2ML IJ SOLN
INTRAMUSCULAR | Status: AC | PRN
  Administered 2019-06-25: 1 mg via INTRAVENOUS

## 2019-06-25 MED ORDER — MIDAZOLAM HCL 2 MG/2ML IJ SOLN
INTRAMUSCULAR | Status: AC
  Filled 2019-06-25: qty 2

## 2019-06-25 MED ORDER — POTASSIUM CHLORIDE CRYS ER 20 MEQ PO TBCR
40.0000 meq | EXTENDED_RELEASE_TABLET | Freq: Once | ORAL | Status: AC
  Administered 2019-06-25: 40 meq via ORAL
  Filled 2019-06-25 (×2): qty 2

## 2019-06-25 MED ORDER — LIDOCAINE HCL 1 % IJ SOLN
INTRAMUSCULAR | Status: AC
  Filled 2019-06-25: qty 20

## 2019-06-25 MED ORDER — OXYCODONE HCL 5 MG PO TABS
5.0000 mg | ORAL_TABLET | ORAL | Status: DC | PRN
  Filled 2019-06-25: qty 1

## 2019-06-25 MED ORDER — FENTANYL CITRATE (PF) 100 MCG/2ML IJ SOLN
INTRAMUSCULAR | Status: AC
  Filled 2019-06-25: qty 2

## 2019-06-25 MED ORDER — SODIUM CHLORIDE 0.9 % IV SOLN
INTRAVENOUS | Status: DC
  Administered 2019-06-25: 12:00:00 via INTRAVENOUS

## 2019-06-25 MED ORDER — GELATIN ABSORBABLE 12-7 MM EX MISC
CUTANEOUS | Status: AC
  Filled 2019-06-25: qty 1

## 2019-06-25 MED ORDER — LIDOCAINE HCL (PF) 1 % IJ SOLN
INTRAMUSCULAR | Status: AC | PRN
  Administered 2019-06-25: 5 mL

## 2019-06-25 MED ORDER — FENTANYL CITRATE (PF) 100 MCG/2ML IJ SOLN
INTRAMUSCULAR | Status: AC | PRN
  Administered 2019-06-25 (×2): 50 ug via INTRAVENOUS

## 2019-06-25 NOTE — Progress Notes (Signed)
Received call from radiology (pt receiving liver biopsy) stating pt hgb 8.1 and has been experiencing dark stools.  Per Dr. [name] okay to proceed with liver biopsy.  Radiology will draw a type and screen and pt will return to cancer center tomorrow 06/26/2019 to receive 1 unit PRBCs at 0800.  Per Dr. [name], pt to also f/u with out pt GI.  RN placed call to pts GI MD Dr. Richmond Campbell with [facility] [phone]) to schedule f/u apt.  Receptionist stated she will call pt to schedule apt.   Called pt and LVM regarding schedule for blood transfusion tomorrow morning.

## 2019-06-25 NOTE — Discharge Instructions (Signed)
Please call [phone] and ask for IR doctor on call with any questions after hours     Moderate Conscious Sedation, Adult, Care After These instructions provide you with information about caring for yourself after your procedure. Your health care provider may also give you more specific instructions. Your treatment has been planned according to current medical practices, but problems sometimes occur. Call your health care provider if you have any problems or questions after your procedure. What can I expect after the procedure? After your procedure, it is common:  To feel sleepy for several hours.  To feel clumsy and have poor balance for several hours.  To have poor judgment for several hours.  To vomit if you eat too soon. Follow these instructions at home: For at least 24 hours after the procedure:   Do not: ? Participate in activities where you could fall or become injured. ? Drive. ? Use heavy machinery. ? Drink alcohol. ? Take sleeping pills or medicines that cause drowsiness. ? Make important decisions or sign legal documents. ? Take care of children on your own.  Rest. Eating and drinking  Follow the diet recommended by your health care provider.  If you vomit: ? Drink water, juice, or soup when you can drink without vomiting. ? Make sure you have little or no nausea before eating solid foods. General instructions  Have a responsible adult stay with you until you are awake and alert.  Take over-the-counter and prescription medicines only as told by your health care provider.  If you smoke, do not smoke without supervision.  Keep all follow-up visits as told by your health care provider. This is important. Contact a health care provider if:  Dr [name] [phone]  You keep feeling nauseous or you keep vomiting.  You feel light-headed.  You develop a rash.  You have a fever. Get help right away if:  You have trouble breathing. This information is  not intended to replace advice given to you by your health care provider. Make sure you discuss any questions you have with your health care provider. Document Released: 09/01/2013 Document Revised: 10/24/2017 Document Reviewed: 03/02/2016 Elsevier Patient Education  2020 [place].    Liver Biopsy, Care After These instructions give you information on caring for yourself after your procedure. Your doctor may also give you more specific instructions. Call your doctor if you have any problems or questions after your procedure. What can I expect after the procedure? After the procedure, it is common to have:  Pain and soreness where the biopsy was done.  Bruising around the area where the biopsy was done.  Sleepiness and be tired for a few days. Follow these instructions at home: Medicines  Take over-the-counter and prescription medicines only as told by your doctor.  If you were prescribed an antibiotic medicine, take it as told by your doctor. Do not stop taking the antibiotic even if you start to feel better.  Do not take medicines such as aspirin and ibuprofen. These medicines can thin your blood. Do not take these medicines unless your doctor tells you to take them.  If you are taking prescription pain medicine, take actions to prevent or treat constipation. Your doctor may recommend that you: ? Drink enough fluid to keep your pee (urine) clear or pale yellow. ? Take over-the-counter or prescription medicines. ? Eat foods that are high in fiber, such as fresh fruits and vegetables, whole grains, and beans. ? Limit foods that are high in fat and  processed sugars, such as fried and sweet foods. Caring for your cut  Follow instructions from your doctor about how to take care of your cuts from surgery (incisions). Make sure you: ? Wash your hands with soap and water before you change your bandage (dressing). If you cannot use soap and water, use hand sanitizer. ? Change your  bandage as told by your doctor.  You may remove your dressing tomorrow. ? Leave stitches (sutures), skin glue, or skin tape (adhesive) strips in place. They may need to stay in place for 2 weeks or longer. If tape strips get loose and curl up, you may trim the loose edges. Do not remove tape strips completely unless your doctor says it is okay.  Check your cuts every day for signs of infection. Check for: ? Redness, swelling, or more pain. ? Fluid or blood. ? Pus or a bad smell. ? Warmth.  Do not take baths, swim, or use a hot tub until your doctor says it is okay to do so.  You may shower tomorrow. Activity   Rest at home for 1-2 days or as told by your doctor. ? Avoid sitting for a long time without moving. Get up to take short walks every 1-2 hours.  Return to your normal activities as told by your doctor. Ask what activities are safe for you.  Do not do these things in the first 24 hours: ? Drive. ? Use machinery. ? Take a bath or shower.  Do not lift more than 10 pounds (4.5 kg) or play contact sports for the first 2 weeks. General instructions   Do not drink alcohol in the first week after the procedure.  Have someone stay with you for at least 24 hours after the procedure.  Get your test results. Ask your doctor or the department that is doing the test: ? When will my results be ready? ? How will I get my results? ? What are my treatment options? ? What other tests do I need? ? What are my next steps?  Keep all follow-up visits as told by your doctor. This is important. Contact a doctor if:  A cut bleeds and leaves more than just a small spot of blood.  A cut is red, puffs up (swells), or hurts more than before.  Fluid or something else comes from a cut.  A cut smells bad.  You have a fever or chills. Get help right away if:  You have swelling, bloating, or pain in your belly (abdomen).  You get dizzy or faint.  You have a rash.  You feel sick to your  stomach (nauseous) or throw up (vomit).  You have trouble breathing, feel short of breath, or feel faint.  Your chest hurts.  You have problems talking or seeing.  You have trouble with your balance or moving your arms or legs. Summary  After the procedure, it is common to have pain, soreness, bruising, and tiredness.  Your doctor will tell you how to take care of yourself at home. Change your bandage, take your medicines, and limit your activities as told by your doctor.  Call your doctor if you have symptoms of infection. Get help right away if your belly swells, your cut bleeds a lot, or you have trouble talking or breathing. This information is not intended to replace advice given to you by your health care provider. Make sure you discuss any questions you have with your health care provider. Document Released: 08/20/2008 Document Revised:  11/21/2017 Document Reviewed: 11/21/2017 Elsevier Patient Education  [place].

## 2019-06-25 NOTE — Procedures (Signed)
Interventional Radiology Procedure:   Indications: Metastatic breast cancer  Procedure: US guided liver lesion biopsy  Findings: 4 cores from right hepatic lesion  Complications: None     EBL: Less than 20 ml  Plan: Bedrest 3 hours.  Getting labs for blood transfusion.     Laydon Martis R. Anselm Pancoast, MD  Pager: [phone]

## 2019-06-25 NOTE — H&P (Signed)
Referring Physician(s): [name] Lose  Supervising Physician: Markus Daft  Patient Status:  [facility]  Chief Complaint:  " I am having a liver biopsy"  Subjective: Patient familiar to IR service from left thoracentesis on 12/23/17.  She has a history of metastatic left breast carcinoma, initially diagnosed in 2014, status post surgery and chemoradiation.  Recent imaging has revealed worsening of hepatic metastatic disease with stable primarily lytic osseous metastatic lesions. She has a chronic left exudative pleural effusion with upper normal sized subcarinal and AP window lymph nodes.  She presents today for liver lesion biopsy to send for prognostic panel as well as Caris molecular testing.  She currently denies fever, headache, chest pain, cough, abdominal/back pain, nausea, vomiting.  She does have dyspnea with exertion and has recently noticed some dark stools.  Past Medical History:  Diagnosis Date  . Arthritis   . Breast cancer dx'd 2014   left breast  . CAD (coronary artery disease)   . Hyperlipidemia   . Hypertension   . Metastasis ([place]) dx'd 12/21/17   lung, bones and liver  . Myocardial infarction ([place]) 11/22/04  . Peripheral vascular disease ([place])    atherosclerosis carotid artery-mild   Past Surgical History:  Procedure Laterality Date  . ABDOMINAL HYSTERECTOMY  1982  . CATARACT EXTRACTION W/PHACO Right 11/13/2018   Procedure: CATARACT EXTRACTION PHACO AND INTRAOCULAR LENS PLACEMENT RIGHT EYE;  Surgeon: Baruch Goldmann, MD;  Location: [facility];  Service: Ophthalmology;  Laterality: Right;  right  . KNEE ARTHROSCOPY Left    knee  . MASTECTOMY MODIFIED RADICAL Left 11/12/2013   Procedure: MASTECTOMY MODIFIED RADICAL;  Surgeon: Rolm Bookbinder, MD;  Location: [facility];  Service: General;  Laterality: Left;  . 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 PERCUTANEOUS CORONARY STENT INTERVENTION (PCI-S)  2005  . PORTACATH PLACEMENT Right 12/16/2013   Procedure: INSERTION PORT-A-CATH;  Surgeon: Rolm Bookbinder, MD;  Location: [facility];  Service: General;  Laterality: Right;  . TEE WITHOUT CARDIOVERSION N/A 09/26/2017   Procedure: TRANSESOPHAGEAL ECHOCARDIOGRAM (TEE);  Surgeon: Dorothy Spark, MD;  Location: [facility];  Service: Cardiovascular;  Laterality: N/A;      Allergies: Patient has no known allergies.  Medications: Prior to Admission medications   Medication Sig Start Date End Date Taking? Authorizing Provider  acetaminophen (TYLENOL) 500 MG tablet Take 1,000 mg by mouth every 6 (six) hours as needed for mild pain.   Yes [provider]  aspirin EC 81 MG tablet Take 81 mg by mouth daily.   Yes [provider]  atorvastatin (LIPITOR) 20 MG tablet Take 1 tablet (20 mg total) by mouth at bedtime. 04/26/19  Yes [name] Lose, MD  Calcium Carb-Cholecalciferol (CALCIUM 600 + D PO) Take 1 tablet by mouth daily.   Yes [provider]  dexamethasone (DECADRON) 0.5 MG/5ML solution Swish 32mL in mouth for 50min and spit out. Use 4 times daily for 8 weeks, start with Afinitor. Avoid eating/drinking for 1hr after rinse. 04/26/19  Yes [name] Lose, MD  exemestane (AROMASIN) 25 MG tablet Take 1 tablet (25 mg total) by mouth daily after breakfast. 03/09/19  Yes [name] Lose, MD  hydrochlorothiazide (MICROZIDE) 12.5 MG capsule Take 1 capsule (12.5 mg total) by mouth daily. 09/01/18 06/25/19 Yes Kilroy, Doreene Burke, [name]  Multiple Vitamin (MULTIVITAMIN WITH MINERALS) TABS tablet Take 1 tablet by mouth daily.   Yes [provider]  AFINITOR 10 MG tablet TAKE 1 TABLET (10 MG TOTAL) BY MOUTH DAILY. 06/25/19  [name] Lose, MD  dexamethasone (DECADRON) 1 MG tablet TAKE 1 TABLET BY MOUTH EVERY DAY 05/20/19   [name] Lose, MD  doxycycline (VIBRAMYCIN) 100 MG capsule Take 1 capsule (100 mg total) by mouth 2 (two) times daily. 05/04/19   [name], [name]  nitroGLYCERIN (NITROSTAT) 0.4 MG SL tablet PLACE 1 TABLET (0.4 MG  TOTAL) UNDER THE TONGUE EVERY 5 (FIVE) MINUTES AS NEEDED FOR CHEST PAIN. 09/28/18   [name] Lose, MD  ondansetron (ZOFRAN ODT) 8 MG disintegrating tablet Take 1 tablet (8 mg total) by mouth every 8 (eight) hours as needed for nausea or vomiting. 01/12/18   [name] Lose, MD  oxyCODONE-acetaminophen (PERCOCET/ROXICET) 5-325 MG tablet Take 1 tablet by mouth every 8 (eight) hours as needed for severe pain. 04/08/19   [name] Lose, MD     Vital Signs: BP (!) 125/99   Pulse 99   Temp 98.6 F (37 C) (Oral)   Resp 16   SpO2 100%   Physical Exam patient awake, alert.  Chest with diminished breath sounds left mid to lower lung zone, right clear.  Heart with regular rate rhythm, positive murmur.  Abdomen soft, positive bowel sounds, nontender.  No lower extremity edema.  Imaging: No results found.  Labs:  CBC: Recent Labs    03/29/19 0816 04/26/19 0805 06/08/19 0913 06/25/19 1150  WBC 3.0* 5.2 7.3 5.2  HGB 11.4* 12.0 11.3* 8.1*  HCT 35.2* 38.4 35.4* 26.6*  PLT 139* 182 233 264    COAGS: Recent Labs    06/25/19 1150  INR 1.0    BMP: Recent Labs    03/29/19 0816 04/26/19 0805 06/08/19 0913 06/25/19 1150  NA 144 144 140 140  K 3.7 3.6 3.1* 2.9*  CL 107 105 103 104  CO2 26 27 24 26   GLUCOSE 93 99 98 102*  BUN 20 19 23 14   CALCIUM 8.7* 8.9 8.8* 8.9  CREATININE 0.91 0.84 1.10* 1.10*  GFRNONAA >60 >60 48* 48*  GFRAA >60 >60 56* 56*    LIVER FUNCTION TESTS: Recent Labs    03/29/19 0816 04/26/19 0805 06/08/19 0913 06/25/19 1150  BILITOT 0.3 0.4 0.8 0.6  AST 32 110* 44* 33  ALT 23 140* 26 20  ALKPHOS 52 105 70 56  PROT 6.5 7.0 7.0 6.9  ALBUMIN 3.2* 3.3* 3.4* 3.6    Assessment and Plan: Pt with history of metastatic left breast carcinoma, initially diagnosed in 2014, status post surgery and chemoradiation.  Recent imaging has revealed worsening of hepatic metastatic disease with stable primarily lytic osseous metastatic lesions. She has a chronic left exudative  pleural effusion with upper normal sized subcarinal and AP window lymph nodes.  She presents today for liver lesion biopsy to send for prognostic panel as well as Caris molecular testing.Risks and benefits of procedure was discussed with the patient and/or patient's family including, but not limited to bleeding, infection, damage to adjacent structures or low yield requiring additional tests.  All of the questions were answered and there is agreement to proceed.  Consent signed and in chart.   Review of patient's lab work today reveals hemoglobin 8.1 down from 11.3  2 weeks ago as well as potassium of 2.9.  Findings discussed with Drs. Henn and [place].  Decision made to proceed with liver biopsy today.  Patient will receive oral potassium supplementation and be typed and screened today.  She is tentatively scheduled for blood transfusion at the [facility] tomorrow morning.    Electronically Signed: D. Lennette Bihari  Wendy Klaus, [name] 06/25/2019, 12:47 PM   I spent a total of 25 minutes at the the patient's bedside AND on the patient's hospital floor or unit, greater than 50% of which was counseling/coordinating care for image guided liver lesion biopsy

## 2019-06-26 ENCOUNTER — Other Ambulatory Visit: Payer: Self-pay

## 2019-06-26 ENCOUNTER — Inpatient Hospital Stay: Payer: Medicare Other | Attending: Hematology and Oncology

## 2019-06-26 DIAGNOSIS — Z79811 Long term (current) use of aromatase inhibitors: Secondary | ICD-10-CM | POA: Diagnosis not present

## 2019-06-26 DIAGNOSIS — Z170 Estrogen receptor positive status [ER+]: Secondary | ICD-10-CM | POA: Insufficient documentation

## 2019-06-26 DIAGNOSIS — Z79899 Other long term (current) drug therapy: Secondary | ICD-10-CM | POA: Diagnosis not present

## 2019-06-26 DIAGNOSIS — C779 Secondary and unspecified malignant neoplasm of lymph node, unspecified: Secondary | ICD-10-CM | POA: Insufficient documentation

## 2019-06-26 DIAGNOSIS — C50412 Malignant neoplasm of upper-outer quadrant of left female breast: Secondary | ICD-10-CM | POA: Diagnosis not present

## 2019-06-26 DIAGNOSIS — C7951 Secondary malignant neoplasm of bone: Secondary | ICD-10-CM | POA: Insufficient documentation

## 2019-06-26 DIAGNOSIS — C7802 Secondary malignant neoplasm of left lung: Secondary | ICD-10-CM | POA: Diagnosis not present

## 2019-06-26 DIAGNOSIS — E785 Hyperlipidemia, unspecified: Secondary | ICD-10-CM | POA: Insufficient documentation

## 2019-06-26 DIAGNOSIS — Z7982 Long term (current) use of aspirin: Secondary | ICD-10-CM | POA: Diagnosis not present

## 2019-06-26 DIAGNOSIS — C7801 Secondary malignant neoplasm of right lung: Secondary | ICD-10-CM | POA: Diagnosis not present

## 2019-06-26 DIAGNOSIS — Z923 Personal history of irradiation: Secondary | ICD-10-CM | POA: Insufficient documentation

## 2019-06-26 DIAGNOSIS — C787 Secondary malignant neoplasm of liver and intrahepatic bile duct: Secondary | ICD-10-CM | POA: Diagnosis not present

## 2019-06-26 LAB — BPAM RBC
Blood Product Expiration Date: 202008282359
Unit Type and Rh: 5100

## 2019-06-26 LAB — TYPE AND SCREEN
ABO/RH(D): O POS
Antibody Screen: NEGATIVE
Unit division: 0

## 2019-06-26 LAB — ABO/RH: ABO/RH(D): O POS

## 2019-06-26 MED ORDER — DIPHENHYDRAMINE HCL 25 MG PO CAPS
ORAL_CAPSULE | ORAL | Status: AC
  Filled 2019-06-26: qty 1

## 2019-06-26 MED ORDER — HEPARIN SOD (PORK) LOCK FLUSH 100 UNIT/ML IV SOLN
500.0000 [IU] | Freq: Every day | INTRAVENOUS | Status: DC | PRN
  Filled 2019-06-26: qty 5

## 2019-06-26 MED ORDER — SODIUM CHLORIDE 0.9% FLUSH
3.0000 mL | INTRAVENOUS | Status: DC | PRN
  Filled 2019-06-26: qty 10

## 2019-06-26 MED ORDER — SODIUM CHLORIDE 0.9% FLUSH
10.0000 mL | INTRAVENOUS | Status: DC | PRN
  Filled 2019-06-26: qty 10

## 2019-06-26 MED ORDER — DIPHENHYDRAMINE HCL 25 MG PO TABS
25.0000 mg | ORAL_TABLET | Freq: Once | ORAL | Status: AC
  Administered 2019-06-26: 25 mg via ORAL

## 2019-06-26 MED ORDER — SODIUM CHLORIDE 0.9% IV SOLUTION
250.0000 mL | Freq: Once | INTRAVENOUS | Status: DC
  Filled 2019-06-26: qty 250

## 2019-06-26 MED ORDER — HEPARIN SOD (PORK) LOCK FLUSH 100 UNIT/ML IV SOLN
250.0000 [IU] | INTRAVENOUS | Status: DC | PRN
  Filled 2019-06-26: qty 5

## 2019-06-26 MED ORDER — ACETAMINOPHEN 325 MG PO TABS
650.0000 mg | ORAL_TABLET | Freq: Once | ORAL | Status: AC
  Administered 2019-06-26: 09:00:00 650 mg via ORAL

## 2019-06-26 MED ORDER — ACETAMINOPHEN 325 MG PO TABS
ORAL_TABLET | ORAL | Status: AC
  Filled 2019-06-26: qty 2

## 2019-06-26 MED ORDER — SODIUM CHLORIDE 0.9% IV SOLUTION
250.0000 mL | Freq: Once | INTRAVENOUS | Status: AC
  Administered 2019-06-26: 250 mL via INTRAVENOUS
  Filled 2019-06-26: qty 250

## 2019-06-26 NOTE — Patient Instructions (Signed)
Blood Transfusion, Adult, Care After This sheet gives you information about how to care for yourself after your procedure. Your doctor may also give you more specific instructions. If you have problems or questions, contact your doctor. Follow these instructions at home:   Take over-the-counter and prescription medicines only as told by your doctor.  Go back to your normal activities as told by your doctor.  Follow instructions from your doctor about how to take care of the area where an IV tube was put into your vein (insertion site). Make sure you: ? Wash your hands with soap and water before you change your bandage (dressing). If there is no soap and water, use hand sanitizer. ? Change your bandage as told by your doctor.  Check your IV insertion site every day for signs of infection. Check for: ? More redness, swelling, or pain. ? More fluid or blood. ? Warmth. ? Pus or a bad smell. Contact a doctor if:  You have more redness, swelling, or pain around the IV insertion site.  You have more fluid or blood coming from the IV insertion site.  Your IV insertion site feels warm to the touch.  You have pus or a bad smell coming from the IV insertion site.  Your pee (urine) turns pink, red, or brown.  You feel weak after doing your normal activities. Get help right away if:  You have signs of a serious allergic or body defense (immune) system reaction, including: ? Itchiness. ? Hives. ? Trouble breathing. ? Anxiety. ? Pain in your chest or lower back. ? Fever, flushing, and chills. ? Fast pulse. ? Rash. ? Watery poop (diarrhea). ? Throwing up (vomiting). ? Dark pee. ? Serious headache. ? Dizziness. ? Stiff neck. ? Yellow color in your face or the white parts of your eyes (jaundice). Summary  After a blood transfusion, return to your normal activities as told by your doctor.  Every day, check for signs of infection where the IV tube was put into your vein.  Some  signs of infection are warm skin, more redness and pain, more fluid or blood, and pus or a bad smell where the needle went in.  Contact your doctor if you feel weak or have any unusual symptoms. This information is not intended to replace advice given to you by your health care provider. Make sure you discuss any questions you have with your health care provider. Document Released: 12/02/2014 Document Revised: 03/18/2018 Document Reviewed: 07/05/2016 Elsevier Patient Education  2020 Elsevier Inc.  

## 2019-06-27 LAB — BPAM RBC
Blood Product Expiration Date: 202008282359
ISSUE DATE / TIME: 202008010928
Unit Type and Rh: 5100

## 2019-06-27 LAB — TYPE AND SCREEN
ABO/RH(D): O POS
Antibody Screen: NEGATIVE
Unit division: 0

## 2019-06-27 LAB — ABO/RH: ABO/RH(D): O POS

## 2019-06-28 MED FILL — AFINITOR 10 MG TABLET: 10 | 28 days supply | Qty: 28 | Fill #0

## 2019-06-29 DIAGNOSIS — Z86010 Personal history of colonic polyps: Secondary | ICD-10-CM | POA: Diagnosis not present

## 2019-06-29 DIAGNOSIS — D500 Iron deficiency anemia secondary to blood loss (chronic): Secondary | ICD-10-CM | POA: Diagnosis not present

## 2019-06-29 DIAGNOSIS — K5909 Other constipation: Secondary | ICD-10-CM | POA: Diagnosis not present

## 2019-06-29 DIAGNOSIS — K921 Melena: Secondary | ICD-10-CM | POA: Diagnosis not present

## 2019-06-30 NOTE — Assessment & Plan Note (Signed)
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 She had radiation therapy and has been on antiestrogen therapy with Arimidex 06/28/2014-January 2019  Thoracentesis: Met Breast cancer ER PR Positive and Her 2 Neg PET/CT scan. 01/04/18: New malignant Pleural effusion with small bil pulm nodules,new mild bilateral mediastinal hilar portocaval lymph node metastases, new liver metastases, new diffuse bone metastases.  Ibrance with Faslodexstarted 12/29/2017-02/27/2019  --------------------------------------------------------------------------- Current treatment:Exemestane with everolimus started 03/09/2019  06/25/2019: Liver biopsy: 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, ER 100%, PR 0%, Ki-67 10%, HER-2 -1+ by [facility]  Caris molecular testing has been sent. Based on these results we will decide if any treatment change is warranted.

## 2019-07-05 DIAGNOSIS — D62 Acute posthemorrhagic anemia: Secondary | ICD-10-CM | POA: Diagnosis not present

## 2019-07-05 DIAGNOSIS — K2980 Duodenitis without bleeding: Secondary | ICD-10-CM | POA: Diagnosis not present

## 2019-07-05 DIAGNOSIS — K921 Melena: Secondary | ICD-10-CM | POA: Diagnosis not present

## 2019-07-05 DIAGNOSIS — Z86010 Personal history of colonic polyps: Secondary | ICD-10-CM | POA: Diagnosis not present

## 2019-07-05 DIAGNOSIS — D123 Benign neoplasm of transverse colon: Secondary | ICD-10-CM | POA: Diagnosis not present

## 2019-07-05 DIAGNOSIS — Z1211 Encounter for screening for malignant neoplasm of colon: Secondary | ICD-10-CM | POA: Diagnosis not present

## 2019-07-05 DIAGNOSIS — K2970 Gastritis, unspecified, without bleeding: Secondary | ICD-10-CM | POA: Diagnosis not present

## 2019-07-05 DIAGNOSIS — D124 Benign neoplasm of descending colon: Secondary | ICD-10-CM | POA: Diagnosis not present

## 2019-07-06 NOTE — Progress Notes (Signed)
Patient Care Team: Delorse Limber as PCP - General (Family Medicine)  DIAGNOSIS:    ICD-10-CM   1. Malignant neoplasm metastatic to lung, unspecified laterality ([facility])  C78.00   2. Malignant neoplasm of upper-outer quadrant of left breast in female, estrogen receptor positive ([place])  C50.412    Z17.0     SUMMARY OF ONCOLOGIC HISTORY: Oncology History  Malignant neoplasm of upper-outer quadrant of left breast in female, estrogen receptor positive ([place])  07/28/2013 Mammogram   Spiculated mass at 6:00 position 3 cm, ultrasound revealed 2.6 x 2.1 x 1.5 cm second mass at 12:00 2.1 x 1.8 x 2.1 cm: 2 abnormal lymph nodes left axilla   10/29/2013 Initial Diagnosis   Breast cancer of upper-outer quadrant of left female breast: 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 [name], T3N2 (5/10 LN positive) stage IIIa IDC grade 1, ER/PR 100%, TSH 14% HER-2 negative PET/CT 12/20/2013 negative for metastases   12/17/2013 - 04/02/2014 Chemotherapy   Taxotere Cytoxan every 3 weeks for 6 cycles   05/11/2014 - 06/27/2014 Radiation Therapy   Adjuvant radiation therapy to chest wall and lymph nodes   07/06/2014 -  Anti-estrogen oral therapy   Arimidex 1 mg daily   12/21/2017 Imaging   Metastatic lesions noted in the lungs bilaterally, bilateral hilar and mediastinal lymph nodes, left pleural effusion, multiple thoracic vertebral metastases, heterogeneous lesions in the liver   12/22/2017 Procedure   Pleural Effusion Thoracentesis: Metastatic Breast cancer ER PR Positive Her 2 Neg   12/29/2017 Treatment Plan Change   Ibrance, Faslodex and Xgeva   03/09/2019 Treatment Plan Change   Everolimus and Exemestane   06/25/2019 Pathology Results   Liver biopsy: 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, ER 100%, PR 0%, Ki-67 10%, HER-2 -1+ by IHC     CHIEF COMPLIANT: Follow-up oneverolimus and exemestane  to discuss biopsy   INTERVAL HISTORY: Wendy [name] is a 78 y.o. with above-mentioned history of metastatic breast cancer currently on treatment witheverolimus and exemestane. Liver biopsy on 06/25/19 confirmed metastatic carcinoma, HER-2 negative (1+), ER 100%, PR negative, Ki67 10%. She presents to the clinic today for a toxicity check and to discuss her biopsy results.   REVIEW OF SYSTEMS:   Constitutional: Denies fevers, chills or abnormal weight loss Eyes: Denies blurriness of vision Ears, nose, mouth, throat, and face: Denies mucositis or sore throat Respiratory: Denies cough, dyspnea or wheezes Cardiovascular: Denies palpitation, chest discomfort Gastrointestinal: Denies nausea, heartburn or change in bowel habits Skin: Denies abnormal skin rashes Lymphatics: Denies new lymphadenopathy or easy bruising Neurological: Denies numbness, tingling or new weaknesses Behavioral/Psych: Mood is stable, no new changes  Extremities: No lower extremity edema Breast: denies any pain or lumps or nodules in either breasts All other systems were reviewed with the patient and are negative.  I have reviewed the past medical history, past surgical history, social history and family history with the patient and they are unchanged from previous note.  ALLERGIES:  has No Known Allergies.  MEDICATIONS:  Current Outpatient Medications  Medication Sig Dispense Refill  . acetaminophen (TYLENOL) 500 MG tablet Take 1,000 mg by mouth every 6 (six) hours as needed for mild pain.    [name] AFINITOR 10 MG tablet TAKE 1 TABLET (10 MG TOTAL) BY MOUTH DAILY. 28 tablet 3  . aspirin EC 81 MG tablet Take 81 mg by mouth daily.    [name] atorvastatin (  LIPITOR) 20 MG tablet Take 1 tablet (20 mg total) by mouth at bedtime. 90 tablet 3  . Calcium Carb-Cholecalciferol (CALCIUM 600 + D PO) Take 1 tablet by mouth daily.    [name] dexamethasone (DECADRON) 0.5 MG/5ML solution Swish 79m in mouth for 280m and spit out. Use 4 times daily  for 8 weeks, start with Afinitor. Avoid eating/drinking for 1hr after rinse. 500 mL 3  . dexamethasone (DECADRON) 1 MG tablet TAKE 1 TABLET BY MOUTH EVERY DAY 30 tablet 0  . doxycycline (VIBRAMYCIN) 100 MG capsule Take 1 capsule (100 mg total) by mouth 2 (two) times daily. 20 capsule 0  . exemestane (AROMASIN) 25 MG tablet Take 1 tablet (25 mg total) by mouth daily after breakfast. 90 tablet 3  . hydrochlorothiazide (MICROZIDE) 12.5 MG capsule Take 1 capsule (12.5 mg total) by mouth daily. 90 capsule 3  . Multiple Vitamin (MULTIVITAMIN WITH MINERALS) TABS tablet Take 1 tablet by mouth daily.    . nitroGLYCERIN (NITROSTAT) 0.4 MG SL tablet PLACE 1 TABLET (0.4 MG TOTAL) UNDER THE TONGUE EVERY 5 (FIVE) MINUTES AS NEEDED FOR CHEST PAIN. 25 tablet 0  . ondansetron (ZOFRAN ODT) 8 MG disintegrating tablet Take 1 tablet (8 mg total) by mouth every 8 (eight) hours as needed for nausea or vomiting. 30 tablet 6  . oxyCODONE-acetaminophen (PERCOCET/ROXICET) 5-325 MG tablet Take 1 tablet by mouth every 8 (eight) hours as needed for severe pain. 30 tablet 0   No current facility-administered medications for this visit.     PHYSICAL EXAMINATION: ECOG PERFORMANCE STATUS: 1 - Symptomatic but completely ambulatory  Vitals:   07/07/19 0909  BP: (!) 144/64  Pulse: 92  Resp: 16  Temp: 98.2 F (36.8 C)  SpO2: 98%   Filed Weights   07/07/19 0909  Weight: 149 lb 11.2 oz (67.9 kg)    GENERAL: alert, no distress and comfortable SKIN: skin color, texture, turgor are normal, no rashes or significant lesions EYES: normal, Conjunctiva are pink and non-injected, sclera clear OROPHARYNX: no exudate, no erythema and lips, buccal mucosa, and tongue normal  NECK: supple, thyroid normal size, non-tender, without nodularity LYMPH: no palpable lymphadenopathy in the cervical, axillary or inguinal LUNGS: clear to auscultation and percussion with normal breathing effort HEART: regular rate & rhythm and no murmurs and  no lower extremity edema ABDOMEN: abdomen soft, non-tender and normal bowel sounds MUSCULOSKELETAL: no cyanosis of digits and no clubbing  NEURO: alert & oriented x 3 with fluent speech, no focal motor/sensory deficits EXTREMITIES: No lower extremity edema  LABORATORY DATA:  I have reviewed the data as listed CMP Latest Ref Rng & Units 06/25/2019 06/08/2019 04/26/2019  Glucose 70 - 99 mg/dL 102(H) 98 99  BUN 8 - 23 mg/dL 14 23 19   Creatinine 0.44 - 1.00 mg/dL 1.10(H) 1.10(H) 0.84  Sodium 135 - 145 mmol/L 140 140 144  Potassium 3.5 - 5.1 mmol/L 2.9(L) 3.1(L) 3.6  Chloride 98 - 111 mmol/L 104 103 105  CO2 22 - 32 mmol/L 26 24 27   Calcium 8.9 - 10.3 mg/dL 8.9 8.8(L) 8.9  Total Protein 6.5 - 8.1 g/dL 6.9 7.0 7.0  Total Bilirubin 0.3 - 1.2 mg/dL 0.6 0.8 0.4  Alkaline Phos 38 - 126 U/L 56 70 105  AST 15 - 41 U/L 33 44(H) 110(H)  ALT 0 - 44 U/L 20 26 140(H)    Lab Results  Component Value Date   WBC 4.2 07/07/2019   HGB 9.7 (L) 07/07/2019   HCT 30.9 (L) 07/07/2019  MCV 85.1 07/07/2019   PLT 241 07/07/2019   NEUTROABS 2.6 07/07/2019    ASSESSMENT & PLAN:  Malignant neoplasm of upper-outer quadrant of left breast in female, 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 She had radiation therapy and has been on antiestrogen therapy with Arimidex 06/28/2014-January 2019  Thoracentesis: Met Breast cancer ER PR Positive and Her 2 Neg PET/CT scan. 01/04/18: New malignant Pleural effusion with small bil pulm nodules,new mild bilateral mediastinal hilar portocaval lymph node metastases, new liver metastases, new diffuse bone metastases.  Ibrance with Faslodex 12/29/2017-02/27/2019  --------------------------------------------------------------------------- Current treatment:Exemestane with everolimus started  03/09/2019  06/25/2019: Liver biopsy: 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, ER 100%, PR 0%, Ki-67 10%, HER-2 -1+ by IHC Xgeva will be given today. Caris molecular testing has been sent. Based on these results we will decide if any treatment change is warranted.  No orders of the defined types were placed in this encounter.  The patient has a good understanding of the overall plan. she agrees with it. she will call with any problems that may develop before the next visit here.  [name] Lose, MD 07/07/2019  Julious Oka Dorshimer am acting as scribe for Dr. [name] Lose.  I have reviewed the above documentation for accuracy and completeness, and I agree with the above.

## 2019-07-07 ENCOUNTER — Other Ambulatory Visit: Payer: Self-pay | Admitting: *Deleted

## 2019-07-07 ENCOUNTER — Telehealth: Payer: Self-pay | Admitting: Hematology and Oncology

## 2019-07-07 ENCOUNTER — Inpatient Hospital Stay: Payer: Medicare Other

## 2019-07-07 ENCOUNTER — Inpatient Hospital Stay (HOSPITAL_BASED_OUTPATIENT_CLINIC_OR_DEPARTMENT_OTHER): Payer: Medicare Other | Admitting: Hematology and Oncology

## 2019-07-07 ENCOUNTER — Other Ambulatory Visit: Payer: Self-pay

## 2019-07-07 VITALS — BP 144/64 | HR 92 | Temp 98.2°F | Resp 16 | Ht 68.0 in | Wt 149.7 lb

## 2019-07-07 DIAGNOSIS — C50412 Malignant neoplasm of upper-outer quadrant of left female breast: Secondary | ICD-10-CM

## 2019-07-07 DIAGNOSIS — E876 Hypokalemia: Secondary | ICD-10-CM

## 2019-07-07 DIAGNOSIS — Z170 Estrogen receptor positive status [ER+]: Secondary | ICD-10-CM

## 2019-07-07 DIAGNOSIS — C7802 Secondary malignant neoplasm of left lung: Secondary | ICD-10-CM | POA: Diagnosis not present

## 2019-07-07 DIAGNOSIS — C7800 Secondary malignant neoplasm of unspecified lung: Secondary | ICD-10-CM

## 2019-07-07 DIAGNOSIS — C787 Secondary malignant neoplasm of liver and intrahepatic bile duct: Secondary | ICD-10-CM | POA: Diagnosis not present

## 2019-07-07 DIAGNOSIS — C779 Secondary and unspecified malignant neoplasm of lymph node, unspecified: Secondary | ICD-10-CM | POA: Diagnosis not present

## 2019-07-07 DIAGNOSIS — C7951 Secondary malignant neoplasm of bone: Secondary | ICD-10-CM | POA: Diagnosis not present

## 2019-07-07 DIAGNOSIS — C7801 Secondary malignant neoplasm of right lung: Secondary | ICD-10-CM | POA: Diagnosis not present

## 2019-07-07 LAB — CMP (CANCER CENTER ONLY)
ALT: 22 U/L (ref 0–44)
AST: 34 U/L (ref 15–41)
Albumin: 3.5 g/dL (ref 3.5–5.0)
Alkaline Phosphatase: 61 U/L (ref 38–126)
Anion gap: 13 (ref 5–15)
BUN: 14 mg/dL (ref 8–23)
CO2: 24 mmol/L (ref 22–32)
Calcium: 9 mg/dL (ref 8.9–10.3)
Chloride: 104 mmol/L (ref 98–111)
Creatinine: 1.05 mg/dL — ABNORMAL HIGH (ref 0.44–1.00)
GFR, Est AFR Am: 59 mL/min — ABNORMAL LOW (ref >60)
GFR, Estimated: 51 mL/min — ABNORMAL LOW (ref >60)
Glucose, Bld: 119 mg/dL — ABNORMAL HIGH (ref 70–99)
Potassium: 3 mmol/L — CL (ref 3.5–5.1)
Sodium: 141 mmol/L (ref 135–145)
Total Bilirubin: 0.6 mg/dL (ref 0.3–1.2)
Total Protein: 6.8 g/dL (ref 6.5–8.1)

## 2019-07-07 LAB — CBC WITH DIFFERENTIAL (CANCER CENTER ONLY)
Abs Immature Granulocytes: 0.03 10*3/uL (ref 0.00–0.07)
Basophils Absolute: 0 10*3/uL (ref 0.0–0.1)
Basophils Relative: 1 %
Eosinophils Absolute: 0.4 10*3/uL (ref 0.0–0.5)
Eosinophils Relative: 9 %
HCT: 30.9 % — ABNORMAL LOW (ref 36.0–46.0)
Hemoglobin: 9.7 g/dL — ABNORMAL LOW (ref 12.0–15.0)
Immature Granulocytes: 1 %
Lymphocytes Relative: 14 %
Lymphs Abs: 0.6 10*3/uL — ABNORMAL LOW (ref 0.7–4.0)
MCH: 26.7 pg (ref 26.0–34.0)
MCHC: 31.4 g/dL (ref 30.0–36.0)
MCV: 85.1 fL (ref 80.0–100.0)
Monocytes Absolute: 0.6 10*3/uL (ref 0.1–1.0)
Monocytes Relative: 13 %
Neutro Abs: 2.6 10*3/uL (ref 1.7–7.7)
Neutrophils Relative %: 62 %
Platelet Count: 241 10*3/uL (ref 150–400)
RBC: 3.63 MIL/uL — ABNORMAL LOW (ref 3.87–5.11)
RDW: 16.5 % — ABNORMAL HIGH (ref 11.5–15.5)
WBC Count: 4.2 10*3/uL (ref 4.0–10.5)
nRBC: 0 % (ref 0.0–0.2)

## 2019-07-07 MED ORDER — DENOSUMAB 120 MG/1.7ML ~~LOC~~ SOLN
120.0000 mg | Freq: Once | SUBCUTANEOUS | Status: AC
  Administered 2019-07-07: 120 mg via SUBCUTANEOUS

## 2019-07-07 MED ORDER — DENOSUMAB 120 MG/1.7ML ~~LOC~~ SOLN
SUBCUTANEOUS | Status: AC
  Filled 2019-07-07: qty 1.7

## 2019-07-07 MED ORDER — POTASSIUM CHLORIDE CRYS ER 20 MEQ PO TBCR: 20.0000 meq | EXTENDED_RELEASE_TABLET | Freq: Every day | ORAL | 0 refills | Status: DC

## 2019-07-07 MED FILL — POTASSIUM CHLORIDE CRYS ER: 20 | 30 days supply | Qty: 30 | Fill #0

## 2019-07-07 NOTE — Progress Notes (Signed)
Per Dr. [name] pt was told to pick Potassium up from pharmacy due to decreased levels. Pt understand.

## 2019-07-07 NOTE — Progress Notes (Signed)
Received critical lab of potassium 3.0.  Per Dr. [name], pt to take 20 mEq potassium p.o once a day at home.  Script sent to pt pharmacy on file and pt notified.

## 2019-07-07 NOTE — Telephone Encounter (Signed)
Scheduled per 08/12 los, patient has been called and notified.

## 2019-07-19 DIAGNOSIS — Z170 Estrogen receptor positive status [ER+]: Secondary | ICD-10-CM | POA: Diagnosis not present

## 2019-07-19 DIAGNOSIS — C7801 Secondary malignant neoplasm of right lung: Secondary | ICD-10-CM | POA: Diagnosis not present

## 2019-07-19 DIAGNOSIS — C50412 Malignant neoplasm of upper-outer quadrant of left female breast: Secondary | ICD-10-CM | POA: Diagnosis not present

## 2019-07-19 DIAGNOSIS — C7802 Secondary malignant neoplasm of left lung: Secondary | ICD-10-CM | POA: Diagnosis not present

## 2019-07-19 DIAGNOSIS — C771 Secondary and unspecified malignant neoplasm of intrathoracic lymph nodes: Secondary | ICD-10-CM | POA: Diagnosis not present

## 2019-07-19 DIAGNOSIS — C782 Secondary malignant neoplasm of pleura: Secondary | ICD-10-CM | POA: Diagnosis not present

## 2019-07-19 DIAGNOSIS — C7951 Secondary malignant neoplasm of bone: Secondary | ICD-10-CM | POA: Diagnosis not present

## 2019-07-19 DIAGNOSIS — C787 Secondary malignant neoplasm of liver and intrahepatic bile duct: Secondary | ICD-10-CM | POA: Diagnosis not present

## 2019-07-26 MED FILL — AFINITOR 10 MG TABLET: 10 | 28 days supply | Qty: 28 | Fill #1

## 2019-08-03 NOTE — Progress Notes (Signed)
Patient Care Team: Delorse Limber as PCP - General (Family Medicine)  DIAGNOSIS:    ICD-10-CM   1. Malignant neoplasm metastatic to lung, unspecified laterality ([place])  C78.00 CT Chest W Contrast    CT Abdomen Pelvis W Contrast    CBC with Differential ([place])    CMP ([place] only)  2. Bone metastases ([place])  C79.51 CT Abdomen Pelvis W Contrast    CBC with Differential ([facility] Only)    CMP ([place] only)  3. Liver metastases ([place])  C78.7 CT Abdomen Pelvis W Contrast    CBC with Differential ([place])    CMP ([place] only)  4. Malignant neoplasm of upper-outer quadrant of left breast in female, estrogen receptor positive ([place])  C50.412    Z17.0     SUMMARY OF ONCOLOGIC HISTORY: Oncology History  Malignant neoplasm of upper-outer quadrant of left breast in female, estrogen receptor positive ([place])  07/28/2013 Mammogram   Spiculated mass at 6:00 position 3 cm, ultrasound revealed 2.6 x 2.1 x 1.5 cm second mass at 12:00 2.1 x 1.8 x 2.1 cm: 2 abnormal lymph nodes left axilla   10/29/2013 Initial Diagnosis   Breast cancer of upper-outer quadrant of left female breast: 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 [name], T3N2 (5/10 LN positive) stage IIIa IDC grade 1, ER/PR 100%, TSH 14% HER-2 negative PET/CT 12/20/2013 negative for metastases   12/17/2013 - 04/02/2014 Chemotherapy   Taxotere Cytoxan every 3 weeks for 6 cycles   05/11/2014 - 06/27/2014 Radiation Therapy   Adjuvant radiation therapy to chest wall and lymph nodes   07/06/2014 -  Anti-estrogen oral therapy   Arimidex 1 mg daily   12/21/2017 Imaging   Metastatic lesions noted in the lungs bilaterally, bilateral hilar and mediastinal lymph nodes, left pleural effusion, multiple thoracic vertebral metastases, heterogeneous lesions in the liver   12/22/2017 Procedure   Pleural Effusion Thoracentesis:  Metastatic Breast cancer ER PR Positive Her 2 Neg   12/29/2017 Treatment Plan Change   Ibrance, Faslodex and Xgeva   03/09/2019 Treatment Plan Change   Everolimus and Exemestane   06/25/2019 Pathology Results   Liver biopsy: 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, ER 100%, PR 0%, Ki-67 10%, HER-2 -1+ by IHC     CHIEF COMPLIANT: Follow-up oneverolimus and exemestane  INTERVAL HISTORY: Wendy [name] is a 78 y.o. with above-mentioned history of metastatic breast cancer currently on treatment witheverolimus and exemestane and Xgeva. CARIS molecular testing showed a PIK3CA mutation. She presents to the clinic today to discuss the CARIS results and further treatment.  She has been tolerating Afinitor extremely well.  She has not had any worsening of mouth sores.  She does have fatigue.  REVIEW OF SYSTEMS:   Constitutional: Denies fevers, chills or abnormal weight loss Eyes: Denies blurriness of vision Ears, nose, mouth, throat, and face: Denies mucositis or sore throat Respiratory: Denies cough, dyspnea or wheezes Cardiovascular: Denies palpitation, chest discomfort Gastrointestinal: Denies nausea, heartburn or change in bowel habits Skin: Denies abnormal skin rashes Lymphatics: Denies new lymphadenopathy or easy bruising Neurological: Denies numbness, tingling or new weaknesses Behavioral/Psych: Mood is stable, no new changes  Extremities: No lower extremity edema Breast: denies any pain or lumps or nodules in either breasts All other systems were reviewed with the patient and are negative.  I have reviewed the past medical history, past surgical history,  social history and family history with the patient and they are unchanged from previous note.  ALLERGIES:  has No Known Allergies.  MEDICATIONS:  Current Outpatient Medications  Medication Sig Dispense Refill  . acetaminophen (TYLENOL) 500 MG tablet Take 1,000 mg by mouth every 6 (six) hours as needed  for mild pain.    [name] AFINITOR 10 MG tablet TAKE 1 TABLET (10 MG TOTAL) BY MOUTH DAILY. 28 tablet 3  . aspirin EC 81 MG tablet Take 81 mg by mouth daily.    [name] atorvastatin (LIPITOR) 20 MG tablet Take 1 tablet (20 mg total) by mouth at bedtime. 90 tablet 3  . Calcium Carb-Cholecalciferol (CALCIUM 600 + D PO) Take 1 tablet by mouth daily.    [name] dexamethasone (DECADRON) 0.5 MG/5ML solution Swish 19m in mouth for 248m and spit out. Use 4 times daily for 8 weeks, start with Afinitor. Avoid eating/drinking for 1hr after rinse. 500 mL 3  . dexamethasone (DECADRON) 1 MG tablet TAKE 1 TABLET BY MOUTH EVERY DAY 30 tablet 0  . doxycycline (VIBRAMYCIN) 100 MG capsule Take 1 capsule (100 mg total) by mouth 2 (two) times daily. 20 capsule 0  . exemestane (AROMASIN) 25 MG tablet Take 1 tablet (25 mg total) by mouth daily after breakfast. 90 tablet 3  . ferrous sulfate (SLOW IRON) 160 (50 Fe) MG TBCR SR tablet Take 1 tablet (160 mg total) by mouth daily. 30 tablet 0  . hydrochlorothiazide (MICROZIDE) 12.5 MG capsule Take 1 capsule (12.5 mg total) by mouth daily. 90 capsule 3  . Multiple Vitamin (MULTIVITAMIN WITH MINERALS) TABS tablet Take 1 tablet by mouth daily.    . nitroGLYCERIN (NITROSTAT) 0.4 MG SL tablet PLACE 1 TABLET (0.4 MG TOTAL) UNDER THE TONGUE EVERY 5 (FIVE) MINUTES AS NEEDED FOR CHEST PAIN. 25 tablet 0  . omeprazole (PRILOSEC) 40 MG capsule Take 1 capsule (40 mg total) by mouth daily.    . ondansetron (ZOFRAN ODT) 8 MG disintegrating tablet Take 1 tablet (8 mg total) by mouth every 8 (eight) hours as needed for nausea or vomiting. 30 tablet 6  . oxyCODONE-acetaminophen (PERCOCET/ROXICET) 5-325 MG tablet Take 1 tablet by mouth every 8 (eight) hours as needed for severe pain. 30 tablet 0  . potassium chloride SA (K-DUR) 20 MEQ tablet Take 1 tablet (20 mEq total) by mouth daily. 30 tablet 3   No current facility-administered medications for this visit.     PHYSICAL EXAMINATION: ECOG PERFORMANCE  STATUS: 1 - Symptomatic but completely ambulatory  Vitals:   08/04/19 1324  BP: (!) 133/59  Pulse: 99  Resp: 17  Temp: 98.5 F (36.9 C)  SpO2: 100%   Filed Weights   08/04/19 1324  Weight: 146 lb 14.4 oz (66.6 kg)    GENERAL: alert, no distress and comfortable SKIN: skin color, texture, turgor are normal, no rashes or significant lesions EYES: normal, Conjunctiva are pink and non-injected, sclera clear OROPHARYNX: no exudate, no erythema and lips, buccal mucosa, and tongue normal  NECK: supple, thyroid normal size, non-tender, without nodularity LYMPH: no palpable lymphadenopathy in the cervical, axillary or inguinal LUNGS: clear to auscultation and percussion with normal breathing effort HEART: regular rate & rhythm and no murmurs and no lower extremity edema ABDOMEN: abdomen soft, non-tender and normal bowel sounds MUSCULOSKELETAL: no cyanosis of digits and no clubbing  NEURO: alert & oriented x 3 with fluent speech, no focal motor/sensory deficits EXTREMITIES: No lower extremity edema  LABORATORY DATA:  I have reviewed the data as  listed CMP Latest Ref Rng & Units 08/04/2019 07/07/2019 06/25/2019  Glucose 70 - 99 mg/dL 103(H) 119(H) 102(H)  BUN 8 - 23 mg/dL 26(H) 14 14  Creatinine 0.44 - 1.00 mg/dL 1.32(H) 1.05(H) 1.10(H)  Sodium 135 - 145 mmol/L 141 141 140  Potassium 3.5 - 5.1 mmol/L 3.5 3.0(LL) 2.9(L)  Chloride 98 - 111 mmol/L 106 104 104  CO2 22 - 32 mmol/L 25 24 26   Calcium 8.9 - 10.3 mg/dL 9.4 9.0 8.9  Total Protein 6.5 - 8.1 g/dL 6.9 6.8 6.9  Total Bilirubin 0.3 - 1.2 mg/dL 0.4 0.6 0.6  Alkaline Phos 38 - 126 U/L 65 61 56  AST 15 - 41 U/L 34 34 33  ALT 0 - 44 U/L 20 22 20     Lab Results  Component Value Date   WBC 5.3 08/04/2019   HGB 9.5 (L) 08/04/2019   HCT 30.7 (L) 08/04/2019   MCV 85.5 08/04/2019   PLT 179 08/04/2019   NEUTROABS 3.9 08/04/2019    ASSESSMENT & PLAN:  Malignant neoplasm of upper-outer quadrant of left breast in female, estrogen  receptor positive ([facility]) Left breast cancer T3, N2, 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 She had radiation therapy and has been on antiestrogen therapy with Arimidex 06/28/2014-January 2019  Thoracentesis: Met Breast cancer ER PR Positive and Her 2 Neg PET/CT scan. 01/04/18: New malignant Pleural effusion with small bil pulm nodules,new mild bilateral mediastinal hilar portocaval lymph node metastases, new liver metastases, new diffuse bone metastases.  Ibrance with Faslodex 12/29/2017-02/27/2019 Caris molecular testing: PIK3CA mutation present, androgen receptor positive, negative for BRCA1, 2 and PDL 1. --------------------------------------------------------------------------- Current treatment:Exemestane with everolimus started 03/09/2019  06/25/2019: Liver biopsy: 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, ER 100%, PR 0%, Ki-67 10%, HER-2 -1+ by IHC I discussed the Caris molecular testing results with the patient which showed PI K3 CA mutation as well as ER positivity. Patient would be eligible for Alpelisib with Faslodex at the next line of therapy. Androgen receptor signaling is still being studied currently with enzalutamide.  It is an option if she progresses on Alpelisib.  Given the fact that she only has mild evidence of progression of disease we decided not to change therapy at this time. Our plan is to obtain CT chest abdomen pelvis with contrast in October and follow-up after that and discuss the treatment plan.         Orders Placed This Encounter  Procedures  . CT Chest W Contrast    Standing Status:   Future    Standing Expiration Date:   08/03/2020    Order Specific Question:   ** REASON FOR EXAM (FREE TEXT)    Answer:   Met breast cancer restaging on treatment    Order Specific Question:   If indicated for the  ordered procedure, I authorize the administration of contrast media per Radiology protocol    Answer:   Yes    Order Specific Question:   Preferred imaging location?    Answer:   [facility]    Order Specific Question:   Radiology Contrast Protocol - do NOT remove file path    Answer:   [email or web address]  . CT Abdomen Pelvis W Contrast    Standing Status:   Future    Standing Expiration Date:   11/02/2020    Order Specific Question:   **  REASON FOR EXAM (FREE TEXT)    Answer:   met breast cancer restaging    Order Specific Question:   If indicated for the ordered procedure, I authorize the administration of contrast media per Radiology protocol    Answer:   Yes    Order Specific Question:   Preferred imaging location?    Answer:   [facility]    Order Specific Question:   Is Oral Contrast requested for this exam?    Answer:   Yes, Per Radiology protocol    Order Specific Question:   Radiology Contrast Protocol - do NOT remove file path    Answer:   \\[email or web address]  . CBC with Differential ([place] Only)    Standing Status:   Future    Standing Expiration Date:   08/03/2020  . CMP ([place] only)    Standing Status:   Future    Standing Expiration Date:   08/03/2020   The patient has a good understanding of the overall plan. she agrees with it. she will call with any problems that may develop before the next visit here.  [name] Lose, MD 08/04/2019  Julious Oka Dorshimer am acting as scribe for Dr. [name] Lose.  I have reviewed the above documentation for accuracy and completeness, and I agree with the above.

## 2019-08-04 ENCOUNTER — Inpatient Hospital Stay (HOSPITAL_BASED_OUTPATIENT_CLINIC_OR_DEPARTMENT_OTHER): Payer: Medicare Other | Admitting: Hematology and Oncology

## 2019-08-04 ENCOUNTER — Inpatient Hospital Stay: Payer: Medicare Other | Attending: Hematology and Oncology

## 2019-08-04 ENCOUNTER — Other Ambulatory Visit: Payer: Self-pay

## 2019-08-04 ENCOUNTER — Ambulatory Visit: Payer: Medicare Other

## 2019-08-04 VITALS — BP 133/59 | HR 99 | Temp 98.5°F | Resp 17 | Ht 68.0 in | Wt 146.9 lb

## 2019-08-04 DIAGNOSIS — Z923 Personal history of irradiation: Secondary | ICD-10-CM | POA: Insufficient documentation

## 2019-08-04 DIAGNOSIS — C7951 Secondary malignant neoplasm of bone: Secondary | ICD-10-CM | POA: Diagnosis not present

## 2019-08-04 DIAGNOSIS — Z170 Estrogen receptor positive status [ER+]: Secondary | ICD-10-CM | POA: Insufficient documentation

## 2019-08-04 DIAGNOSIS — Z9221 Personal history of antineoplastic chemotherapy: Secondary | ICD-10-CM | POA: Insufficient documentation

## 2019-08-04 DIAGNOSIS — Z79899 Other long term (current) drug therapy: Secondary | ICD-10-CM | POA: Diagnosis not present

## 2019-08-04 DIAGNOSIS — Z79811 Long term (current) use of aromatase inhibitors: Secondary | ICD-10-CM | POA: Insufficient documentation

## 2019-08-04 DIAGNOSIS — C779 Secondary and unspecified malignant neoplasm of lymph node, unspecified: Secondary | ICD-10-CM | POA: Diagnosis not present

## 2019-08-04 DIAGNOSIS — Z9012 Acquired absence of left breast and nipple: Secondary | ICD-10-CM | POA: Insufficient documentation

## 2019-08-04 DIAGNOSIS — C50412 Malignant neoplasm of upper-outer quadrant of left female breast: Secondary | ICD-10-CM | POA: Diagnosis not present

## 2019-08-04 DIAGNOSIS — C787 Secondary malignant neoplasm of liver and intrahepatic bile duct: Secondary | ICD-10-CM

## 2019-08-04 DIAGNOSIS — J910 Malignant pleural effusion: Secondary | ICD-10-CM | POA: Insufficient documentation

## 2019-08-04 DIAGNOSIS — C7802 Secondary malignant neoplasm of left lung: Secondary | ICD-10-CM | POA: Diagnosis not present

## 2019-08-04 DIAGNOSIS — C7801 Secondary malignant neoplasm of right lung: Secondary | ICD-10-CM | POA: Insufficient documentation

## 2019-08-04 DIAGNOSIS — R5383 Other fatigue: Secondary | ICD-10-CM | POA: Diagnosis not present

## 2019-08-04 DIAGNOSIS — C7800 Secondary malignant neoplasm of unspecified lung: Secondary | ICD-10-CM | POA: Diagnosis not present

## 2019-08-04 LAB — CBC WITH DIFFERENTIAL (CANCER CENTER ONLY)
Abs Immature Granulocytes: 0.03 10*3/uL (ref 0.00–0.07)
Basophils Absolute: 0 10*3/uL (ref 0.0–0.1)
Basophils Relative: 1 %
Eosinophils Absolute: 0.2 10*3/uL (ref 0.0–0.5)
Eosinophils Relative: 3 %
HCT: 30.7 % — ABNORMAL LOW (ref 36.0–46.0)
Hemoglobin: 9.5 g/dL — ABNORMAL LOW (ref 12.0–15.0)
Immature Granulocytes: 1 %
Lymphocytes Relative: 11 %
Lymphs Abs: 0.6 10*3/uL — ABNORMAL LOW (ref 0.7–4.0)
MCH: 26.5 pg (ref 26.0–34.0)
MCHC: 30.9 g/dL (ref 30.0–36.0)
MCV: 85.5 fL (ref 80.0–100.0)
Monocytes Absolute: 0.6 10*3/uL (ref 0.1–1.0)
Monocytes Relative: 11 %
Neutro Abs: 3.9 10*3/uL (ref 1.7–7.7)
Neutrophils Relative %: 73 %
Platelet Count: 179 10*3/uL (ref 150–400)
RBC: 3.59 MIL/uL — ABNORMAL LOW (ref 3.87–5.11)
RDW: 17.5 % — ABNORMAL HIGH (ref 11.5–15.5)
WBC Count: 5.3 10*3/uL (ref 4.0–10.5)
nRBC: 0 % (ref 0.0–0.2)

## 2019-08-04 LAB — CMP (CANCER CENTER ONLY)
ALT: 20 U/L (ref 0–44)
AST: 34 U/L (ref 15–41)
Albumin: 3.6 g/dL (ref 3.5–5.0)
Alkaline Phosphatase: 65 U/L (ref 38–126)
Anion gap: 10 (ref 5–15)
BUN: 26 mg/dL — ABNORMAL HIGH (ref 8–23)
CO2: 25 mmol/L (ref 22–32)
Calcium: 9.4 mg/dL (ref 8.9–10.3)
Chloride: 106 mmol/L (ref 98–111)
Creatinine: 1.32 mg/dL — ABNORMAL HIGH (ref 0.44–1.00)
GFR, Est AFR Am: 45 mL/min — ABNORMAL LOW (ref >60)
GFR, Estimated: 39 mL/min — ABNORMAL LOW (ref >60)
Glucose, Bld: 103 mg/dL — ABNORMAL HIGH (ref 70–99)
Potassium: 3.5 mmol/L (ref 3.5–5.1)
Sodium: 141 mmol/L (ref 135–145)
Total Bilirubin: 0.4 mg/dL (ref 0.3–1.2)
Total Protein: 6.9 g/dL (ref 6.5–8.1)

## 2019-08-04 MED ORDER — SLOW IRON 160 (50 FE) MG PO TBCR: 160.0000 mg | EXTENDED_RELEASE_TABLET | Freq: Every day | ORAL | 0 refills | Status: AC

## 2019-08-04 MED ORDER — POTASSIUM CHLORIDE CRYS ER 20 MEQ PO TBCR: 20.0000 meq | EXTENDED_RELEASE_TABLET | Freq: Every day | ORAL | 3 refills | Status: DC

## 2019-08-04 MED ORDER — OMEPRAZOLE 40 MG PO CPDR: 40.0000 mg | DELAYED_RELEASE_CAPSULE | Freq: Every day | ORAL | Status: DC

## 2019-08-04 MED FILL — POTASSIUM CHLORIDE CRYS ER: 20 | 30 days supply | Qty: 30 | Fill #0

## 2019-08-04 NOTE — Assessment & Plan Note (Signed)
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 She had radiation therapy and has been on antiestrogen therapy with Arimidex 06/28/2014-January 2019  Thoracentesis: Met Breast cancer ER PR Positive and Her 2 Neg PET/CT scan. 01/04/18: New malignant Pleural effusion with small bil pulm nodules,new mild bilateral mediastinal hilar portocaval lymph node metastases, new liver metastases, new diffuse bone metastases.  Ibrance with Faslodex 12/29/2017-02/27/2019 Caris molecular testing: PIK3CA mutation present, androgen receptor positive, negative for BRCA1, 2 and PDL 1. --------------------------------------------------------------------------- Current treatment:Exemestane with everolimus started 03/09/2019  06/25/2019: Liver biopsy: 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, ER 100%, PR 0%, Ki-67 10%, HER-2 -1+ by IHC I discussed the Caris molecular testing results with the patient which showed PI K3 CA mutation as well as ER positivity. Patient would be eligible for Alpelisib with Faslodex at the next line of therapy. Androgen receptor signaling is still being studied currently with enzalutamide.  It is an option if she progresses on Alpelisib.  Given the fact that she only has mild evidence of progression of disease we decided not to change therapy at this time. Our plan is to obtain CT chest abdomen pelvis with contrast in October and follow-up after that and discuss the treatment plan.

## 2019-08-05 ENCOUNTER — Telehealth: Payer: Self-pay | Admitting: Hematology and Oncology

## 2019-08-05 NOTE — Telephone Encounter (Signed)
I talk with patient regarding schedule  

## 2019-08-15 ENCOUNTER — Other Ambulatory Visit: Payer: Self-pay

## 2019-08-15 ENCOUNTER — Inpatient Hospital Stay (HOSPITAL_COMMUNITY)
Admission: EM | Admit: 2019-08-15 | Discharge: 2019-08-22 | DRG: 378 | Disposition: A | Discharge status: Home / Self Care | Payer: Medicare Other | Attending: Family Medicine | Admitting: Family Medicine

## 2019-08-15 ENCOUNTER — Encounter (HOSPITAL_COMMUNITY): Payer: Self-pay | Admitting: Emergency Medicine

## 2019-08-15 DIAGNOSIS — I252 Old myocardial infarction: Secondary | ICD-10-CM

## 2019-08-15 DIAGNOSIS — Z8249 Family history of ischemic heart disease and other diseases of the circulatory system: Secondary | ICD-10-CM

## 2019-08-15 DIAGNOSIS — C787 Secondary malignant neoplasm of liver and intrahepatic bile duct: Secondary | ICD-10-CM | POA: Diagnosis not present

## 2019-08-15 DIAGNOSIS — Z853 Personal history of malignant neoplasm of breast: Secondary | ICD-10-CM

## 2019-08-15 DIAGNOSIS — C50919 Malignant neoplasm of unspecified site of unspecified female breast: Secondary | ICD-10-CM

## 2019-08-15 DIAGNOSIS — R42 Dizziness and giddiness: Secondary | ICD-10-CM | POA: Diagnosis not present

## 2019-08-15 DIAGNOSIS — Z66 Do not resuscitate: Secondary | ICD-10-CM | POA: Diagnosis not present

## 2019-08-15 DIAGNOSIS — Z79811 Long term (current) use of aromatase inhibitors: Secondary | ICD-10-CM

## 2019-08-15 DIAGNOSIS — R7989 Other specified abnormal findings of blood chemistry: Secondary | ICD-10-CM

## 2019-08-15 DIAGNOSIS — I10 Essential (primary) hypertension: Secondary | ICD-10-CM | POA: Diagnosis present

## 2019-08-15 DIAGNOSIS — R0602 Shortness of breath: Secondary | ICD-10-CM | POA: Diagnosis not present

## 2019-08-15 DIAGNOSIS — E876 Hypokalemia: Secondary | ICD-10-CM

## 2019-08-15 DIAGNOSIS — C7951 Secondary malignant neoplasm of bone: Secondary | ICD-10-CM | POA: Diagnosis present

## 2019-08-15 DIAGNOSIS — Z20828 Contact with and (suspected) exposure to other viral communicable diseases: Secondary | ICD-10-CM | POA: Diagnosis not present

## 2019-08-15 DIAGNOSIS — K219 Gastro-esophageal reflux disease without esophagitis: Secondary | ICD-10-CM

## 2019-08-15 DIAGNOSIS — N183 Chronic kidney disease, stage 3 unspecified: Secondary | ICD-10-CM | POA: Diagnosis present

## 2019-08-15 DIAGNOSIS — G893 Neoplasm related pain (acute) (chronic): Secondary | ICD-10-CM | POA: Diagnosis present

## 2019-08-15 DIAGNOSIS — Z825 Family history of asthma and other chronic lower respiratory diseases: Secondary | ICD-10-CM

## 2019-08-15 DIAGNOSIS — I248 Other forms of acute ischemic heart disease: Secondary | ICD-10-CM | POA: Diagnosis not present

## 2019-08-15 DIAGNOSIS — D62 Acute posthemorrhagic anemia: Secondary | ICD-10-CM | POA: Diagnosis not present

## 2019-08-15 DIAGNOSIS — R0600 Dyspnea, unspecified: Secondary | ICD-10-CM

## 2019-08-15 DIAGNOSIS — N390 Urinary tract infection, site not specified: Secondary | ICD-10-CM | POA: Diagnosis present

## 2019-08-15 DIAGNOSIS — R0689 Other abnormalities of breathing: Secondary | ICD-10-CM | POA: Diagnosis not present

## 2019-08-15 DIAGNOSIS — I959 Hypotension, unspecified: Secondary | ICD-10-CM | POA: Diagnosis not present

## 2019-08-15 DIAGNOSIS — Z955 Presence of coronary angioplasty implant and graft: Secondary | ICD-10-CM

## 2019-08-15 DIAGNOSIS — Z85118 Personal history of other malignant neoplasm of bronchus and lung: Secondary | ICD-10-CM

## 2019-08-15 DIAGNOSIS — K31811 Angiodysplasia of stomach and duodenum with bleeding: Principal | ICD-10-CM | POA: Diagnosis present

## 2019-08-15 DIAGNOSIS — E785 Hyperlipidemia, unspecified: Secondary | ICD-10-CM | POA: Diagnosis present

## 2019-08-15 DIAGNOSIS — I739 Peripheral vascular disease, unspecified: Secondary | ICD-10-CM | POA: Diagnosis present

## 2019-08-15 DIAGNOSIS — R778 Other specified abnormalities of plasma proteins: Secondary | ICD-10-CM

## 2019-08-15 DIAGNOSIS — K2971 Gastritis, unspecified, with bleeding: Secondary | ICD-10-CM | POA: Diagnosis present

## 2019-08-15 DIAGNOSIS — R069 Unspecified abnormalities of breathing: Secondary | ICD-10-CM | POA: Diagnosis not present

## 2019-08-15 DIAGNOSIS — R000 Tachycardia, unspecified: Secondary | ICD-10-CM | POA: Diagnosis not present

## 2019-08-15 DIAGNOSIS — I9589 Other hypotension: Secondary | ICD-10-CM | POA: Diagnosis present

## 2019-08-15 DIAGNOSIS — Z7982 Long term (current) use of aspirin: Secondary | ICD-10-CM

## 2019-08-15 DIAGNOSIS — Z905 Acquired absence of kidney: Secondary | ICD-10-CM

## 2019-08-15 DIAGNOSIS — T451X5A Adverse effect of antineoplastic and immunosuppressive drugs, initial encounter: Secondary | ICD-10-CM | POA: Diagnosis present

## 2019-08-15 DIAGNOSIS — B9681 Helicobacter pylori [H. pylori] as the cause of diseases classified elsewhere: Secondary | ICD-10-CM | POA: Diagnosis present

## 2019-08-15 DIAGNOSIS — E861 Hypovolemia: Secondary | ICD-10-CM

## 2019-08-15 DIAGNOSIS — I2510 Atherosclerotic heart disease of native coronary artery without angina pectoris: Secondary | ICD-10-CM | POA: Diagnosis present

## 2019-08-15 DIAGNOSIS — I2489 Other forms of acute ischemic heart disease: Secondary | ICD-10-CM

## 2019-08-15 DIAGNOSIS — Z87891 Personal history of nicotine dependence: Secondary | ICD-10-CM

## 2019-08-15 DIAGNOSIS — Z9012 Acquired absence of left breast and nipple: Secondary | ICD-10-CM

## 2019-08-15 DIAGNOSIS — Z8719 Personal history of other diseases of the digestive system: Secondary | ICD-10-CM

## 2019-08-15 DIAGNOSIS — D649 Anemia, unspecified: Secondary | ICD-10-CM | POA: Diagnosis present

## 2019-08-15 DIAGNOSIS — Z79899 Other long term (current) drug therapy: Secondary | ICD-10-CM

## 2019-08-15 DIAGNOSIS — Z90710 Acquired absence of both cervix and uterus: Secondary | ICD-10-CM

## 2019-08-15 DIAGNOSIS — I480 Paroxysmal atrial fibrillation: Secondary | ICD-10-CM | POA: Diagnosis present

## 2019-08-15 DIAGNOSIS — D6959 Other secondary thrombocytopenia: Secondary | ICD-10-CM | POA: Diagnosis present

## 2019-08-15 NOTE — ED Triage Notes (Signed)
78 yo female brought in by [facility] from home. Pt states she has had SOB for a "good while" but the past three days it has gotten increasingly worse. Pt reports associated dizziness, pt states SOB and dizziness is exacerbated when she is on her feet. She states she feels as though she cant get any air so she has been reluctant to get up and move around. Pt has history of lung cancer and currently receiving treatments. EMS placed 18 gauge in Right AC. PT has left sided restriction.

## 2019-08-16 ENCOUNTER — Emergency Department (HOSPITAL_COMMUNITY): Payer: Medicare Other

## 2019-08-16 ENCOUNTER — Observation Stay (HOSPITAL_BASED_OUTPATIENT_CLINIC_OR_DEPARTMENT_OTHER): Payer: Medicare Other

## 2019-08-16 ENCOUNTER — Observation Stay (HOSPITAL_COMMUNITY): Payer: Medicare Other

## 2019-08-16 DIAGNOSIS — R0600 Dyspnea, unspecified: Secondary | ICD-10-CM

## 2019-08-16 DIAGNOSIS — D649 Anemia, unspecified: Secondary | ICD-10-CM | POA: Diagnosis present

## 2019-08-16 DIAGNOSIS — E785 Hyperlipidemia, unspecified: Secondary | ICD-10-CM | POA: Diagnosis not present

## 2019-08-16 DIAGNOSIS — D500 Iron deficiency anemia secondary to blood loss (chronic): Secondary | ICD-10-CM | POA: Diagnosis not present

## 2019-08-16 DIAGNOSIS — I361 Nonrheumatic tricuspid (valve) insufficiency: Secondary | ICD-10-CM

## 2019-08-16 DIAGNOSIS — A048 Other specified bacterial intestinal infections: Secondary | ICD-10-CM | POA: Diagnosis not present

## 2019-08-16 DIAGNOSIS — E861 Hypovolemia: Secondary | ICD-10-CM

## 2019-08-16 DIAGNOSIS — I10 Essential (primary) hypertension: Secondary | ICD-10-CM | POA: Diagnosis not present

## 2019-08-16 DIAGNOSIS — I9589 Other hypotension: Secondary | ICD-10-CM

## 2019-08-16 DIAGNOSIS — R0602 Shortness of breath: Secondary | ICD-10-CM | POA: Diagnosis not present

## 2019-08-16 DIAGNOSIS — R7989 Other specified abnormal findings of blood chemistry: Secondary | ICD-10-CM

## 2019-08-16 LAB — URINALYSIS, ROUTINE W REFLEX MICROSCOPIC
Bilirubin Urine: NEGATIVE
Glucose, UA: NEGATIVE mg/dL
Hgb urine dipstick: NEGATIVE
Ketones, ur: NEGATIVE mg/dL
Nitrite: NEGATIVE
Protein, ur: NEGATIVE mg/dL
Specific Gravity, Urine: 1.015 (ref 1.005–1.030)
pH: 5 (ref 5.0–8.0)

## 2019-08-16 LAB — BASIC METABOLIC PANEL
Anion gap: 7 (ref 5–15)
BUN: 36 mg/dL — ABNORMAL HIGH (ref 8–23)
CO2: 21 mmol/L — ABNORMAL LOW (ref 22–32)
Calcium: 7.7 mg/dL — ABNORMAL LOW (ref 8.9–10.3)
Chloride: 111 mmol/L (ref 98–111)
Creatinine, Ser: 1.01 mg/dL — ABNORMAL HIGH (ref 0.44–1.00)
GFR calc Af Amer: >60 mL/min (ref >60)
GFR calc non Af Amer: 53 mL/min — ABNORMAL LOW (ref >60)
Glucose, Bld: 98 mg/dL (ref 70–99)
Potassium: 3.6 mmol/L (ref 3.5–5.1)
Sodium: 139 mmol/L (ref 135–145)

## 2019-08-16 LAB — PROTIME-INR
INR: 1 (ref 0.8–1.2)
Prothrombin Time: 13.5 seconds (ref 11.4–15.2)

## 2019-08-16 LAB — CBC WITH DIFFERENTIAL/PLATELET
Abs Immature Granulocytes: 0.14 10*3/uL — ABNORMAL HIGH (ref 0.00–0.07)
Abs Immature Granulocytes: 0.15 10*3/uL — ABNORMAL HIGH (ref 0.00–0.07)
Basophils Absolute: 0 10*3/uL (ref 0.0–0.1)
Basophils Absolute: 0.1 10*3/uL (ref 0.0–0.1)
Basophils Relative: 0 %
Basophils Relative: 1 %
Eosinophils Absolute: 0.1 10*3/uL (ref 0.0–0.5)
Eosinophils Absolute: 0.2 10*3/uL (ref 0.0–0.5)
Eosinophils Relative: 1 %
Eosinophils Relative: 3 %
HCT: 14.2 % — ABNORMAL LOW (ref 36.0–46.0)
HCT: 30.9 % — ABNORMAL LOW (ref 36.0–46.0)
Hemoglobin: 10.2 g/dL — ABNORMAL LOW (ref 12.0–15.0)
Hemoglobin: 4.2 g/dL — CL (ref 12.0–15.0)
Immature Granulocytes: 2 %
Immature Granulocytes: 3 %
Lymphocytes Relative: 14 %
Lymphocytes Relative: 16 %
Lymphs Abs: 0.9 10*3/uL (ref 0.7–4.0)
Lymphs Abs: 1.3 10*3/uL (ref 0.7–4.0)
MCH: 26.1 pg (ref 26.0–34.0)
MCH: 29.2 pg (ref 26.0–34.0)
MCHC: 29.6 g/dL — ABNORMAL LOW (ref 30.0–36.0)
MCHC: 33 g/dL (ref 30.0–36.0)
MCV: 88.2 fL (ref 80.0–100.0)
MCV: 88.5 fL (ref 80.0–100.0)
Monocytes Absolute: 0.8 10*3/uL (ref 0.1–1.0)
Monocytes Absolute: 1.2 10*3/uL — ABNORMAL HIGH (ref 0.1–1.0)
Monocytes Relative: 13 %
Monocytes Relative: 14 %
Neutro Abs: 3.6 10*3/uL (ref 1.7–7.7)
Neutro Abs: 6.4 10*3/uL (ref 1.7–7.7)
Neutrophils Relative %: 63 %
Neutrophils Relative %: 70 %
Platelets: 113 10*3/uL — ABNORMAL LOW (ref 150–400)
Platelets: 230 10*3/uL (ref 150–400)
RBC: 1.61 MIL/uL — ABNORMAL LOW (ref 3.87–5.11)
RBC: 3.49 MIL/uL — ABNORMAL LOW (ref 3.87–5.11)
RDW: 15.9 % — ABNORMAL HIGH (ref 11.5–15.5)
RDW: 18.3 % — ABNORMAL HIGH (ref 11.5–15.5)
WBC: 5.6 10*3/uL (ref 4.0–10.5)
WBC: 9.1 10*3/uL (ref 4.0–10.5)
nRBC: 0.5 % — ABNORMAL HIGH (ref 0.0–0.2)
nRBC: 1.2 % — ABNORMAL HIGH (ref 0.0–0.2)

## 2019-08-16 LAB — COMPREHENSIVE METABOLIC PANEL
ALT: 18 U/L (ref 0–44)
ALT: 17 U/L (ref 0–44)
AST: 27 U/L (ref 15–41)
AST: 24 U/L (ref 15–41)
Albumin: 3 g/dL — ABNORMAL LOW (ref 3.5–5.0)
Albumin: 2.5 g/dL — ABNORMAL LOW (ref 3.5–5.0)
Alkaline Phosphatase: 37 U/L — ABNORMAL LOW (ref 38–126)
Alkaline Phosphatase: 30 U/L — ABNORMAL LOW (ref 38–126)
Anion gap: 12 (ref 5–15)
Anion gap: 6 (ref 5–15)
BUN: 42 mg/dL — ABNORMAL HIGH (ref 8–23)
BUN: 39 mg/dL — ABNORMAL HIGH (ref 8–23)
CO2: 23 mmol/L (ref 22–32)
CO2: 20 mmol/L — ABNORMAL LOW (ref 22–32)
Calcium: 8.7 mg/dL — ABNORMAL LOW (ref 8.9–10.3)
Calcium: 7.4 mg/dL — ABNORMAL LOW (ref 8.9–10.3)
Chloride: 107 mmol/L (ref 98–111)
Chloride: 113 mmol/L — ABNORMAL HIGH (ref 98–111)
Creatinine, Ser: 1.13 mg/dL — ABNORMAL HIGH (ref 0.44–1.00)
Creatinine, Ser: 0.93 mg/dL (ref 0.44–1.00)
GFR calc Af Amer: 54 mL/min — ABNORMAL LOW (ref >60)
GFR calc Af Amer: >60 mL/min (ref >60)
GFR calc non Af Amer: 47 mL/min — ABNORMAL LOW (ref >60)
GFR calc non Af Amer: 59 mL/min — ABNORMAL LOW (ref >60)
Glucose, Bld: 138 mg/dL — ABNORMAL HIGH (ref 70–99)
Glucose, Bld: 107 mg/dL — ABNORMAL HIGH (ref 70–99)
Potassium: 3.4 mmol/L — ABNORMAL LOW (ref 3.5–5.1)
Potassium: 3.2 mmol/L — ABNORMAL LOW (ref 3.5–5.1)
Sodium: 142 mmol/L (ref 135–145)
Sodium: 139 mmol/L (ref 135–145)
Total Bilirubin: 0.2 mg/dL — ABNORMAL LOW (ref 0.3–1.2)
Total Bilirubin: 0.5 mg/dL (ref 0.3–1.2)
Total Protein: 5.4 g/dL — ABNORMAL LOW (ref 6.5–8.1)
Total Protein: 4.6 g/dL — ABNORMAL LOW (ref 6.5–8.1)

## 2019-08-16 LAB — SARS CORONAVIRUS 2 BY RT PCR (HOSPITAL ORDER, PERFORMED IN ~~LOC~~ HOSPITAL LAB): SARS Coronavirus 2: NEGATIVE

## 2019-08-16 LAB — CBC
HCT: 23.8 % — ABNORMAL LOW (ref 36.0–46.0)
Hemoglobin: 7.5 g/dL — ABNORMAL LOW (ref 12.0–15.0)
MCH: 27.9 pg (ref 26.0–34.0)
MCHC: 31.5 g/dL (ref 30.0–36.0)
MCV: 88.5 fL (ref 80.0–100.0)
Platelets: 119 10*3/uL — ABNORMAL LOW (ref 150–400)
RBC: 2.69 MIL/uL — ABNORMAL LOW (ref 3.87–5.11)
RDW: 16.9 % — ABNORMAL HIGH (ref 11.5–15.5)
WBC: 5.2 10*3/uL (ref 4.0–10.5)
nRBC: 0.4 % — ABNORMAL HIGH (ref 0.0–0.2)

## 2019-08-16 LAB — ECHOCARDIOGRAM COMPLETE
Height: 68 in
Weight: 2336 oz

## 2019-08-16 LAB — TROPONIN I (HIGH SENSITIVITY)
Troponin I (High Sensitivity): 61 ng/L — ABNORMAL HIGH (ref <18)
Troponin I (High Sensitivity): 561 ng/L (ref <18)
Troponin I (High Sensitivity): 653 ng/L (ref <18)
Troponin I (High Sensitivity): 626 ng/L (ref <18)

## 2019-08-16 LAB — PREPARE RBC (CROSSMATCH)

## 2019-08-16 LAB — MRSA PCR SCREENING: MRSA by PCR: NEGATIVE

## 2019-08-16 LAB — LACTIC ACID, PLASMA: Lactic Acid, Venous: 1.9 mmol/L (ref 0.5–1.9)

## 2019-08-16 MED ORDER — ACETAMINOPHEN 500 MG PO TABS
1000.0000 mg | ORAL_TABLET | Freq: Once | ORAL | Status: AC
  Administered 2019-08-16: 1000 mg via ORAL
  Filled 2019-08-16: qty 2

## 2019-08-16 MED ORDER — TETRACYCLINE HCL 250 MG PO CAPS
500.0000 mg | ORAL_CAPSULE | Freq: Four times a day (QID) | ORAL | Status: DC
  Administered 2019-08-16 – 2019-08-22 (×20): 500 mg via ORAL
  Filled 2019-08-16 (×31): qty 2

## 2019-08-16 MED ORDER — METRONIDAZOLE 500 MG PO TABS
500.0000 mg | ORAL_TABLET | Freq: Two times a day (BID) | ORAL | Status: DC
  Administered 2019-08-16 – 2019-08-19 (×7): 500 mg via ORAL
  Filled 2019-08-16 (×7): qty 1

## 2019-08-16 MED ORDER — SODIUM CHLORIDE 0.9 % IV BOLUS (SEPSIS): 1000.0000 mL | Freq: Once | INTRAVENOUS | Status: DC

## 2019-08-16 MED ORDER — FUROSEMIDE 10 MG/ML IJ SOLN
20.0000 mg | Freq: Once | INTRAMUSCULAR | Status: AC
  Administered 2019-08-16: 20 mg via INTRAVENOUS
  Filled 2019-08-16: qty 2

## 2019-08-16 MED ORDER — ACETAMINOPHEN 650 MG RE SUPP: 650.0000 mg | Freq: Four times a day (QID) | RECTAL | Status: DC | PRN

## 2019-08-16 MED ORDER — SODIUM CHLORIDE 0.9 % IV SOLN
1.0000 g | INTRAVENOUS | Status: DC
  Administered 2019-08-16: 18:00:00 1 g via INTRAVENOUS
  Filled 2019-08-16: qty 10

## 2019-08-16 MED ORDER — SODIUM CHLORIDE 0.9 % IV SOLN
10.0000 mL/h | Freq: Once | INTRAVENOUS | Status: AC
  Administered 2019-08-16: 04:00:00 10 mL/h via INTRAVENOUS

## 2019-08-16 MED ORDER — CHLORHEXIDINE GLUCONATE CLOTH 2 % EX PADS
6.0000 | MEDICATED_PAD | Freq: Every day | CUTANEOUS | Status: DC
  Administered 2019-08-16 – 2019-08-18 (×3): 6 via TOPICAL

## 2019-08-16 MED ORDER — PANTOPRAZOLE SODIUM 40 MG IV SOLR
40.0000 mg | Freq: Two times a day (BID) | INTRAVENOUS | Status: DC
  Administered 2019-08-16 – 2019-08-19 (×7): 40 mg via INTRAVENOUS
  Filled 2019-08-16 (×7): qty 40

## 2019-08-16 MED ORDER — METRONIDAZOLE IN NACL 5-0.79 MG/ML-% IV SOLN
500.0000 mg | Freq: Once | INTRAVENOUS | Status: AC
  Administered 2019-08-16: 02:00:00 500 mg via INTRAVENOUS
  Filled 2019-08-16: qty 100

## 2019-08-16 MED ORDER — SODIUM CHLORIDE 0.9 % IV BOLUS (SEPSIS)
1000.0000 mL | Freq: Once | INTRAVENOUS | Status: AC
  Administered 2019-08-16: 1000 mL via INTRAVENOUS

## 2019-08-16 MED ORDER — ONDANSETRON HCL 4 MG/2ML IJ SOLN
4.0000 mg | Freq: Four times a day (QID) | INTRAMUSCULAR | Status: DC | PRN
  Administered 2019-08-16 – 2019-08-19 (×3): 4 mg via INTRAVENOUS
  Filled 2019-08-16 (×3): qty 2

## 2019-08-16 MED ORDER — ACETAMINOPHEN 325 MG PO TABS
650.0000 mg | ORAL_TABLET | Freq: Four times a day (QID) | ORAL | Status: DC | PRN
  Administered 2019-08-17 – 2019-08-21 (×5): 650 mg via ORAL
  Filled 2019-08-16 (×4): qty 2

## 2019-08-16 MED ORDER — SODIUM CHLORIDE 0.9 % IV BOLUS (SEPSIS)
1000.0000 mL | Freq: Once | INTRAVENOUS | Status: AC
  Administered 2019-08-16: 04:00:00 1000 mL via INTRAVENOUS

## 2019-08-16 MED ORDER — SODIUM CHLORIDE 0.9 % IV SOLN
2.0000 g | Freq: Once | INTRAVENOUS | Status: AC
  Administered 2019-08-16: 2 g via INTRAVENOUS
  Filled 2019-08-16: qty 2

## 2019-08-16 MED ORDER — ACETAMINOPHEN 325 MG PO TABS
650.0000 mg | ORAL_TABLET | Freq: Once | ORAL | Status: AC
  Administered 2019-08-16: 13:00:00 650 mg via ORAL
  Filled 2019-08-16: qty 2

## 2019-08-16 MED ORDER — VANCOMYCIN HCL IN DEXTROSE 1-5 GM/200ML-% IV SOLN: 1000.0000 mg | Freq: Once | INTRAVENOUS | Status: DC

## 2019-08-16 MED ORDER — SODIUM CHLORIDE 0.9% IV SOLUTION
Freq: Once | INTRAVENOUS | Status: AC
  Administered 2019-08-16: 13:00:00 via INTRAVENOUS

## 2019-08-16 MED ORDER — VANCOMYCIN HCL 10 G IV SOLR
1500.0000 mg | Freq: Once | INTRAVENOUS | Status: AC
  Administered 2019-08-16: 04:00:00 1500 mg via INTRAVENOUS
  Filled 2019-08-16: qty 1500

## 2019-08-16 MED ORDER — POTASSIUM CHLORIDE CRYS ER 20 MEQ PO TBCR
40.0000 meq | EXTENDED_RELEASE_TABLET | Freq: Once | ORAL | Status: AC
  Administered 2019-08-16: 18:00:00 40 meq via ORAL
  Filled 2019-08-16: qty 2

## 2019-08-16 MED ORDER — EXEMESTANE 25 MG PO TABS
25.0000 mg | ORAL_TABLET | Freq: Every day | ORAL | Status: DC
  Administered 2019-08-16 – 2019-08-22 (×7): 25 mg via ORAL
  Filled 2019-08-16 (×7): qty 1

## 2019-08-16 MED ORDER — SODIUM CHLORIDE 0.9 % IV SOLN
2.0000 g | INTRAVENOUS | Status: DC
  Filled 2019-08-16: qty 2

## 2019-08-16 MED ORDER — POTASSIUM CHLORIDE CRYS ER 20 MEQ PO TBCR
20.0000 meq | EXTENDED_RELEASE_TABLET | Freq: Once | ORAL | Status: AC
  Administered 2019-08-16: 06:00:00 20 meq via ORAL
  Filled 2019-08-16: qty 1

## 2019-08-16 MED ORDER — POTASSIUM CHLORIDE CRYS ER 20 MEQ PO TBCR
20.0000 meq | EXTENDED_RELEASE_TABLET | Freq: Every day | ORAL | Status: DC
  Administered 2019-08-16 – 2019-08-22 (×7): 20 meq via ORAL
  Filled 2019-08-16 (×7): qty 1

## 2019-08-16 MED ORDER — ATORVASTATIN CALCIUM 20 MG PO TABS
20.0000 mg | ORAL_TABLET | Freq: Every day | ORAL | Status: DC
  Administered 2019-08-16 – 2019-08-21 (×6): 20 mg via ORAL
  Filled 2019-08-16 (×5): qty 1
  Filled 2019-08-16: qty 2
  Filled 2019-08-16: qty 1
  Filled 2019-08-16: qty 2

## 2019-08-16 MED ORDER — BISMUTH SUBSALICYLATE 262 MG PO CHEW
524.0000 mg | CHEWABLE_TABLET | Freq: Four times a day (QID) | ORAL | Status: DC
  Administered 2019-08-16 – 2019-08-20 (×15): 524 mg via ORAL
  Filled 2019-08-16 (×23): qty 2

## 2019-08-16 MED ORDER — DIPHENHYDRAMINE HCL 25 MG PO CAPS
25.0000 mg | ORAL_CAPSULE | Freq: Once | ORAL | Status: AC
  Administered 2019-08-16: 13:00:00 25 mg via ORAL
  Filled 2019-08-16: qty 1

## 2019-08-16 MED ORDER — SODIUM CHLORIDE 0.9 % IV BOLUS (SEPSIS): 250.0000 mL | Freq: Once | INTRAVENOUS | Status: DC

## 2019-08-16 NOTE — H&P (Addendum)
TRH H&P    Patient Demographics:    Wendy [name], is a 78 y.o. female  MRN: [id number]  DOB - 1940/12/05  Admit Date - 08/15/2019  Referring MD/NP/PA:  Erasmo Downer Ward  Outpatient Primary MD for the patient is [name], [name]  Patient coming from:  home  Chief complaint-  Dizziness, sob   HPI:    Wendy [name]  is a 78 y.o. female, w hypertension, hyperlipidemia, CAD, Pafib, PVD, presents with c/o sob, dizziness, worse last nite.  Felt like she was going to pass out.   Pt had recent colonoscopy-> + for polyps by [facility] Campbell.  Slight right upper quadrant pain from metastatic, cancer, but no brbpr, + black stool (chronic), from taking iron.  In ED,  T 98.4, P 111, R 24, Bp 118/87  , repeat 97/48,  Pox 100% on RA  CXR IMPRESSION: 1. Chronic volume loss in the left hemithorax with pleural thickening and scarring. 2. No acute abnormality demonstrated radiographically. 3.  Aortic Atherosclerosis (ICD10-I70.0).  Wbc 9.1, Hgb 4.2, Plt 230 Na 142, K 3.4,  Bun 42, Creatinine 1.13 Alb 3.0,  Ast 27, Alt 18 Trop 61 INR 1.0 Urinalysis  Wbc 11-20, rbc 0-5 Blood culture x2 pending  covid -19 negative  Pt will be admitted for symptomatic anemia, and uti.     Review of systems:    In addition to the HPI above,  No Fever-chills, No Headache, No changes with Vision or hearing, No problems swallowing food or Liquids, No Chest pain, Cough or Shortness of Breath, No Abdominal pain, No Nausea or Vomiting, bowel movements are regular, No Blood in stool or Urine, No dysuria, No new skin rashes or bruises, No new joints pains-aches,  No new weakness, tingling, numbness in any extremity, No recent weight gain or loss, No polyuria, polydypsia or polyphagia, No significant Mental Stressors.  All other systems reviewed and are negative.    Past History of the following :    Past Medical  History:  Diagnosis Date  . Arthritis   . Breast cancer dx'd 2014   left breast  . CAD (coronary artery disease)   . Hyperlipidemia   . Hypertension   . 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 Myocardial infarction ([place]) 11/22/04  . Peripheral vascular disease ([place])    atherosclerosis carotid artery-mild      Past Surgical History:  Procedure Laterality Date  . ABDOMINAL HYSTERECTOMY  1982  . CATARACT EXTRACTION W/PHACO Right 11/13/2018   Procedure: CATARACT EXTRACTION PHACO AND INTRAOCULAR LENS PLACEMENT RIGHT EYE;  Surgeon: Baruch Goldmann, MD;  Location: [facility];  Service: Ophthalmology;  Laterality: Right;  right  . KNEE ARTHROSCOPY Left    knee  . MASTECTOMY MODIFIED RADICAL Left 11/12/2013   Procedure: MASTECTOMY MODIFIED RADICAL;  Surgeon: Rolm Bookbinder, MD;  Location: [facility];  Service: General;  Laterality: Left;  . 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 PERCUTANEOUS CORONARY STENT INTERVENTION (PCI-S)  2005  . PORTACATH PLACEMENT Right  12/16/2013   Procedure: INSERTION PORT-A-CATH;  Surgeon: Rolm Bookbinder, MD;  Location: [facility];  Service: General;  Laterality: Right;  . TEE WITHOUT CARDIOVERSION N/A 09/26/2017   Procedure: TRANSESOPHAGEAL ECHOCARDIOGRAM (TEE);  Surgeon: Dorothy Spark, MD;  Location: [facility] ENDOSCOPY;  Service: Cardiovascular;  Laterality: N/A;      Social History:      Social History   Tobacco Use  . Smoking status: Former Smoker    Packs/day: 0.30    Types: Cigarettes    Quit date: 11/09/1983    Years since quitting: 35.7  . Smokeless tobacco: Never Used  Substance Use Topics  . Alcohol use: Not Currently       Family History :     Family History  Problem Relation Age of Onset  . Emphysema Mother   . Heart attack Father        Home Medications:   Prior to Admission medications   Medication Sig Start Date End Date Taking? Authorizing Provider  acetaminophen (TYLENOL) 500 MG tablet  Take 1,000 mg by mouth every 6 (six) hours as needed for mild pain.   Yes [provider]  AFINITOR 10 MG tablet TAKE 1 TABLET (10 MG TOTAL) BY MOUTH DAILY. Patient taking differently: Take 10 mg by mouth daily.  06/25/19  Yes [name] Lose, MD  aspirin EC 81 MG tablet Take 81 mg by mouth daily.   Yes [provider]  atorvastatin (LIPITOR) 20 MG tablet Take 1 tablet (20 mg total) by mouth at bedtime. 04/26/19  Yes [name] Lose, MD  Calcium Carb-Cholecalciferol (CALCIUM 600 + D PO) Take 1 tablet by mouth daily.   Yes [provider]  exemestane (AROMASIN) 25 MG tablet Take 1 tablet (25 mg total) by mouth daily after breakfast. 03/09/19  Yes [name] Lose, MD  ferrous sulfate (SLOW IRON) 160 (50 Fe) MG TBCR SR tablet Take 1 tablet (160 mg total) by mouth daily. 08/04/19  Yes [name] Lose, MD  hydrochlorothiazide (MICROZIDE) 12.5 MG capsule Take 1 capsule (12.5 mg total) by mouth daily. 09/01/18 08/16/19 Yes Kilroy, Doreene Burke, [name]  Multiple Vitamin (MULTIVITAMIN WITH MINERALS) TABS tablet Take 1 tablet by mouth daily.   Yes [provider]  nitroGLYCERIN (NITROSTAT) 0.4 MG SL tablet PLACE 1 TABLET (0.4 MG TOTAL) UNDER THE TONGUE EVERY 5 (FIVE) MINUTES AS NEEDED FOR CHEST PAIN. 09/28/18  Yes [name] Lose, MD  omeprazole (PRILOSEC) 40 MG capsule Take 1 capsule (40 mg total) by mouth daily. 08/04/19  Yes [name] Lose, MD  ondansetron (ZOFRAN ODT) 8 MG disintegrating tablet Take 1 tablet (8 mg total) by mouth every 8 (eight) hours as needed for nausea or vomiting. 01/12/18  Yes [name] Lose, MD  potassium chloride SA (K-DUR) 20 MEQ tablet Take 1 tablet (20 mEq total) by mouth daily. 08/04/19  Yes [name] Lose, MD  dexamethasone (DECADRON) 0.5 MG/5ML solution Swish 61mL in mouth for 40min and spit out. Use 4 times daily for 8 weeks, start with Afinitor. Avoid eating/drinking for 1hr after rinse. Patient not taking: Reported on 08/16/2019 04/26/19   [name] Lose, MD  dexamethasone  (DECADRON) 1 MG tablet TAKE 1 TABLET BY MOUTH EVERY DAY Patient not taking: Reported on 08/16/2019 05/20/19   [name] Lose, MD  doxycycline (VIBRAMYCIN) 100 MG capsule Take 1 capsule (100 mg total) by mouth 2 (two) times daily. Patient not taking: Reported on 08/16/2019 05/04/19   [name], [name]  oxyCODONE-acetaminophen (PERCOCET/ROXICET) 5-325 MG tablet Take 1 tablet by mouth every 8 (eight)  hours as needed for severe pain. Patient not taking: Reported on 08/16/2019 04/08/19   [name] Lose, MD     Allergies:    No Known Allergies   Physical Exam:   Vitals  Blood pressure (!) 124/57, pulse 88, temperature 98.5 F (36.9 C), resp. rate (!) 26, height 5\' 8"  (1.727 m), weight 66.2 kg, SpO2 100 %.  1.  General: axoxo3  2. Psychiatric: euthymic  3. Neurologic: cn2-12 intact, reflexes 2+ symmetric, diffuse with no clonus, motor 5/5 in all 4 ext  4. HEENMT:  Anicteric, pale conjunctiva, pupils 1.3mm symmetric, direct, consensual, intact Neck: no jvd  5. Respiratory : CTAB  6. Cardiovascular : rrr s1, s2,   7. Gastrointestinal:  Abd: soft, nt, nd, +bs  8. Skin:  Ext: no c/c/e,  No rash  9.Musculoskeletal:  Good ROM    Data Review:    CBC Recent Labs  Lab 08/16/19 0039  WBC 9.1  HGB 4.2*  HCT 14.2*  PLT 230  MCV 88.2  MCH 26.1  MCHC 29.6*  RDW 18.3*  LYMPHSABS 1.3  MONOABS 1.2*  EOSABS 0.1  BASOSABS 0.0   ------------------------------------------------------------------------------------------------------------------  Results for orders placed or performed during the hospital encounter of 08/15/19 (from the past 48 hour(s))  CBC with Differential     Status: Abnormal   Collection Time: 08/16/19 12:39 AM  Result Value Ref Range   WBC 9.1 4.0 - 10.5 K/uL   RBC 1.61 (L) 3.87 - 5.11 MIL/uL   Hemoglobin 4.2 (LL) 12.0 - 15.0 g/dL    Comment: REPEATED TO VERIFY THIS CRITICAL RESULT HAS VERIFIED AND BEEN CALLED TO ZACK,TEETER BY AISHA MOHAMED ON 09 21  2020 AT 0226, AND HAS BEEN READ BACK.     HCT 14.2 (L) 36.0 - 46.0 %   MCV 88.2 80.0 - 100.0 fL   MCH 26.1 26.0 - 34.0 pg   MCHC 29.6 (L) 30.0 - 36.0 g/dL   RDW 18.3 (H) 11.5 - 15.5 %   Platelets 230 150 - 400 K/uL   nRBC 0.5 (H) 0.0 - 0.2 %   Neutrophils Relative % 70 %   Neutro Abs 6.4 1.7 - 7.7 K/uL   Lymphocytes Relative 14 %   Lymphs Abs 1.3 0.7 - 4.0 K/uL   Monocytes Relative 13 %   Monocytes Absolute 1.2 (H) 0.1 - 1.0 K/uL   Eosinophils Relative 1 %   Eosinophils Absolute 0.1 0.0 - 0.5 K/uL   Basophils Relative 0 %   Basophils Absolute 0.0 0.0 - 0.1 K/uL   Immature Granulocytes 2 %   Abs Immature Granulocytes 0.14 (H) 0.00 - 0.07 K/uL    Comment: Performed at [facility], [place] [address], [place], [place] [address]  Comprehensive metabolic panel     Status: Abnormal   Collection Time: 08/16/19 12:39 AM  Result Value Ref Range   Sodium 142 135 - 145 mmol/L   Potassium 3.4 (L) 3.5 - 5.1 mmol/L   Chloride 107 98 - 111 mmol/L   CO2 23 22 - 32 mmol/L   Glucose, Bld 138 (H) 70 - 99 mg/dL   BUN 42 (H) 8 - 23 mg/dL   Creatinine, Ser 1.13 (H) 0.44 - 1.00 mg/dL   Calcium 8.7 (L) 8.9 - 10.3 mg/dL   Total Protein 5.4 (L) 6.5 - 8.1 g/dL   Albumin 3.0 (L) 3.5 - 5.0 g/dL   AST 27 15 - 41 U/L   ALT 18 0 - 44 U/L   Alkaline Phosphatase  37 (L) 38 - 126 U/L   Total Bilirubin 0.2 (L) 0.3 - 1.2 mg/dL   GFR calc non Af Amer 47 (L) >60 mL/min   GFR calc Af Amer 54 (L) >60 mL/min   Anion gap 12 5 - 15    Comment: Performed at [facility], [place] [address], [place], [place] [address]  Troponin I (High Sensitivity)     Status: Abnormal   Collection Time: 08/16/19 12:39 AM  Result Value Ref Range   Troponin I (High Sensitivity) 61 (H) <18 ng/L    Comment: (NOTE) Elevated high sensitivity troponin I (hsTnI) values and significant  changes across serial measurements may suggest ACS but many other  chronic and acute conditions are known to elevate hsTnI  results.  Refer to the Links section for chest pain algorithms and additional  guidance. Performed at [facility], [place] [address] [place], [place] [address]   Protime-INR     Status: None   Collection Time: 08/16/19 12:39 AM  Result Value Ref Range   Prothrombin Time 13.5 11.4 - 15.2 seconds   INR 1.0 0.8 - 1.2    Comment: (NOTE) INR goal varies based on device and disease states. Performed at [facility], [place] [address] [place], [place] [address]   Urinalysis, Routine w reflex microscopic     Status: Abnormal   Collection Time: 08/16/19 12:40 AM  Result Value Ref Range   Color, Urine YELLOW YELLOW   APPearance CLEAR CLEAR   Specific Gravity, Urine 1.015 1.005 - 1.030   pH 5.0 5.0 - 8.0   Glucose, UA NEGATIVE NEGATIVE mg/dL   Hgb urine dipstick NEGATIVE NEGATIVE   Bilirubin Urine NEGATIVE NEGATIVE   Ketones, ur NEGATIVE NEGATIVE mg/dL   Protein, ur NEGATIVE NEGATIVE mg/dL   Nitrite NEGATIVE NEGATIVE   Leukocytes,Ua SMALL (A) NEGATIVE   RBC / HPF 0-5 0 - 5 RBC/hpf   WBC, UA 11-20 0 - 5 WBC/hpf   Bacteria, UA RARE (A) NONE SEEN   Mucus PRESENT     Comment: Performed at [facility], [place] [address], [place], [place] [address]  Blood culture (routine x 2)     Status: None (Preliminary result)   Collection Time: 08/16/19 12:40 AM   Specimen: BLOOD RIGHT FOREARM  Result Value Ref Range   Specimen Description BLOOD RIGHT FOREARM    Special Requests      BOTTLES DRAWN AEROBIC AND ANAEROBIC Blood Culture adequate volume Performed at [facility], [place] [address] [place], [place] [address]    Culture PENDING    Report Status PENDING   Lactic acid, plasma     Status: None   Collection Time: 08/16/19 12:41 AM  Result Value Ref Range   Lactic Acid, Venous 1.9 0.5 - 1.9 mmol/L    Comment: Performed at [facility], [place] [address], [place], [place] [address]  SARS Coronavirus 2 [facility] order,  Performed in [facility] hospital lab) Nasopharyngeal Nasopharyngeal Swab     Status: None   Collection Time: 08/16/19 12:43 AM   Specimen: Nasopharyngeal Swab  Result Value Ref Range   SARS Coronavirus 2 NEGATIVE NEGATIVE    Comment: (NOTE) If result is NEGATIVE SARS-CoV-2 target nucleic acids are NOT DETECTED. The SARS-CoV-2 RNA is generally detectable in upper and lower  respiratory specimens during the acute phase of infection. The lowest  concentration of SARS-CoV-2 viral copies this assay can detect is 250  copies / mL.  A negative result does not preclude SARS-CoV-2 infection  and should not be used as the sole basis for treatment or other  patient management decisions.  A negative result may occur with  improper specimen collection / handling, submission of specimen other  than nasopharyngeal swab, presence of viral mutation(s) within the  areas targeted by this assay, and inadequate number of viral copies  (<250 copies / mL). A negative result must be combined with clinical  observations, patient history, and epidemiological information. If result is POSITIVE SARS-CoV-2 target nucleic acids are DETECTED. The SARS-CoV-2 RNA is generally detectable in upper and lower  respiratory specimens dur ing the acute phase of infection.  Positive  results are indicative of active infection with SARS-CoV-2.  Clinical  correlation with patient history and other diagnostic information is  necessary to determine patient infection status.  Positive results do  not rule out bacterial infection or co-infection with other viruses. If result is PRESUMPTIVE POSTIVE SARS-CoV-2 nucleic acids MAY BE PRESENT.   A presumptive positive result was obtained on the submitted specimen  and confirmed on repeat testing.  While 2019 novel coronavirus  (SARS-CoV-2) nucleic acids may be present in the submitted sample  additional confirmatory testing may be necessary for epidemiological  and / or clinical  management purposes  to differentiate between  SARS-CoV-2 and other Sarbecovirus currently known to infect humans.  If clinically indicated additional testing with an alternate test  methodology [phone]) is advised. The SARS-CoV-2 RNA is generally  detectable in upper and lower respiratory sp ecimens during the acute  phase of infection. The expected result is Negative. Fact Sheet for Patients:  StrictlyIdeas.no Fact Sheet for Healthcare Providers: BankingDealers.co.za This test is not yet approved or cleared by the [place] FDA and has been authorized for detection and/or diagnosis of SARS-CoV-2 by FDA under an Emergency Use Authorization (EUA).  This EUA will remain in effect (meaning this test can be used) for the duration of the COVID-19 declaration under Section 564(b)(1) of the Act, 21 U.S.C. section 360bbb-3(b)(1), unless the authorization is terminated or revoked sooner. Performed at [facility], [place] [address] [place], [place] [address]   Blood culture (routine x 2)     Status: None (Preliminary result)   Collection Time: 08/16/19 12:45 AM   Specimen: BLOOD  Result Value Ref Range   Specimen Description BLOOD RIGHT ANTECUBITAL    Special Requests      BOTTLES DRAWN AEROBIC AND ANAEROBIC Blood Culture adequate volume Performed at [facility], [place] [address] [place], [place] [address]    Culture PENDING    Report Status PENDING   Type and screen     Status: None (Preliminary result)   Collection Time: 08/16/19  2:30 AM  Result Value Ref Range   ABO/RH(D) O POS    Antibody Screen NEG    Sample Expiration 08/19/2019,2359    Unit Number [id number]    Blood Component Type RED CELLS,LR    Unit division 00    Status of Unit ISSUED    Unit tag comment EMERGENCY RELEASE    Transfusion Status OK TO TRANSFUSE    Crossmatch Result PENDING    Unit Number [id number]    Blood Component Type  RED CELLS,LR    Unit division 00    Status of Unit ISSUED    Transfusion Status OK TO TRANSFUSE    Crossmatch Result Compatible    Unit Number [id number]    Blood Component Type RED CELLS,LR  Unit division 00    Status of Unit ISSUED    Transfusion Status OK TO TRANSFUSE    Crossmatch Result      Compatible Performed at [place] [address], [place], [place] [address]   Prepare RBC     Status: None   Collection Time: 08/16/19  2:30 AM  Result Value Ref Range   Order Confirmation      ORDER PROCESSED BY BLOOD BANK Performed at [place] [address] [place], [place] [address]     Chemistries  Recent Labs  Lab 08/16/19 0039  NA 142  K 3.4*  CL 107  CO2 23  GLUCOSE 138*  BUN 42*  CREATININE 1.13*  CALCIUM 8.7*  AST 27  ALT 18  ALKPHOS 37*  BILITOT 0.2*   ------------------------------------------------------------------------------------------------------------------  ------------------------------------------------------------------------------------------------------------------ GFR: Estimated Creatinine Clearance: 41.4 mL/min (A) (by C-G formula based on SCr of 1.13 mg/dL (H)). Liver Function Tests: Recent Labs  Lab 08/16/19 0039  AST 27  ALT 18  ALKPHOS 37*  BILITOT 0.2*  PROT 5.4*  ALBUMIN 3.0*   No results for input(s): LIPASE, AMYLASE in the last 168 hours. No results for input(s): AMMONIA in the last 168 hours. Coagulation Profile: Recent Labs  Lab 08/16/19 0039  INR 1.0   Cardiac Enzymes: No results for input(s): CKTOTAL, CKMB, CKMBINDEX, TROPONINI in the last 168 hours. BNP (last 3 results) No results for input(s): PROBNP in the last 8760 hours. HbA1C: No results for input(s): HGBA1C in the last 72 hours. CBG: No results for input(s): GLUCAP in the last 168 hours. Lipid Profile: No results for input(s): CHOL, HDL, LDLCALC, TRIG, CHOLHDL, LDLDIRECT in the last 72 hours. Thyroid  Function Tests: No results for input(s): TSH, T4TOTAL, FREET4, T3FREE, THYROIDAB in the last 72 hours. Anemia Panel: No results for input(s): VITAMINB12, FOLATE, FERRITIN, TIBC, IRON, RETICCTPCT in the last 72 hours.  --------------------------------------------------------------------------------------------------------------- Urine analysis:    Component Value Date/Time   COLORURINE YELLOW 08/16/2019 0040   APPEARANCEUR CLEAR 08/16/2019 0040   LABSPEC 1.015 08/16/2019 0040   PHURINE 5.0 08/16/2019 0040   GLUCOSEU NEGATIVE 08/16/2019 0040   HGBUR NEGATIVE 08/16/2019 0040   BILIRUBINUR NEGATIVE 08/16/2019 0040   KETONESUR NEGATIVE 08/16/2019 0040   PROTEINUR NEGATIVE 08/16/2019 0040   NITRITE NEGATIVE 08/16/2019 0040   LEUKOCYTESUR SMALL (A) 08/16/2019 0040      Imaging Results:    Dg Chest Portable 1 View  Result Date: 08/16/2019 CLINICAL DATA:  Increasing shortness of breath. History of lung cancer. EXAM: PORTABLE CHEST 1 VIEW COMPARISON:  Chest CT 06/08/2019 FINDINGS: Chronic volume loss in the left hemithorax with blunting of the costophrenic angle. Left apical pleuroparenchymal opacity which represent scarring on prior CT. Unchanged heart size and mediastinal contours allowing for differences in technique and modality. There is aortic atherosclerosis. The right lung is clear. No pulmonary edema. IMPRESSION: 1. Chronic volume loss in the left hemithorax with pleural thickening and scarring. 2. No acute abnormality demonstrated radiographically. 3.  Aortic Atherosclerosis (ICD10-I70.0). 4. Electronically Signed   By: Keith Rake M.D.   On: 08/16/2019 01:40   ekg st at 100, nl axis, no st-t changes c/w ischemia    Assessment & Plan:    Principal Problem:   Symptomatic anemia Active Problems:   Essential hypertension   Hyperlipidemia   CKD (chronic kidney disease), stage III (HCC)  Symptomatic anemia Transfuse 3 units prbc,  Cont ferrous sulfate Check cbc after  transfusion  Acute lower uti  Urine culture Blood culture x2 Cefepime iv pharmacy to dose  Hypotension, elevated troponin Pt received vanco iv in ED Check cardiac echo   Hypertension, Hyperlipidemia, CAD, PVD HOLD bp medication due to hypotension Cont Aspirin Cont Lipitor 20mg  po qday  Gerd Cont PPI  H/o breast cancer, metastatic Please f/u with [facility]   DVT Prophylaxis-  SCDs   AM Labs Ordered, also please review Full Orders  Family Communication: Admission, patients condition and plan of care including tests being ordered have been discussed with the patient  who indicate understanding and agree with the plan and Code Status.  Code Status:  DNR per patient, notified son that patient admitted to [facility]  Admission status: Observation: Based on patients clinical presentation and evaluation of above clinical data, I have made determination that patient meets observation criteria at this time.    Time spent in minutes : 70   Jani Gravel M.D on 08/16/2019 at 5:11 AM

## 2019-08-16 NOTE — ED Notes (Signed)
ED TO INPATIENT HANDOFF REPORT  Name/Age/Gender Wendy [name] 78 y.o. female  Code Status    Code Status Orders  (From admission, onward)         Start     Ordered   08/16/19 0454  Do not attempt resuscitation (DNR)  Continuous    Question Answer Comment  In the event of cardiac or respiratory ARREST Do not call a "code blue"   In the event of cardiac or respiratory ARREST Do not perform Intubation, CPR, defibrillation or ACLS   In the event of cardiac or respiratory ARREST Use medication by any route, position, wound care, and other measures to relive pain and suffering. May use oxygen, suction and manual treatment of airway obstruction as needed for comfort.      08/16/19 0453        Code Status History    Date Active Date Inactive Code Status Order ID Comments User Context   09/23/2017 0030 09/26/2017 2040 DNR [id number]  Ivor Costa, MD ED   11/12/2013 1623 11/13/2013 1304 Full Code [id number]  Rolm Bookbinder, MD Inpatient   Advance Care Planning Activity    Advance Directive Documentation     Most Recent Value  Type of Advance Directive  Healthcare Power of Attorney  Pre-existing out of facility DNR order (yellow form or pink MOST form)  -  "MOST" Form in Place?  -      Home/SNF/Other Home  Chief Complaint Shortness of Breath  Level of Care/Admitting Diagnosis ED Disposition    ED Disposition Condition [place]: [place] [100102]  Level of Care: Stepdown [14]  Admit to SDU based on following criteria: Hemodynamic compromise or significant risk of instability:  Patient requiring short term acute titration and management of vasoactive drips, and invasive monitoring (i.e., CVP and Arterial line).  Covid Evaluation: Asymptomatic Screening Protocol (No Symptoms)  Diagnosis: Symptomatic anemia [8502774]  Admitting Physician: Jani Gravel [[id number]]  Attending Physician: Jani Gravel [[id number]]  PT Class (Do Not Modify):  Observation [104]  PT Acc Code (Do Not Modify): Observation [10022]       Medical History Past Medical History:  Diagnosis Date  . Arthritis   . Breast cancer dx'd 2014   left breast  . CAD (coronary artery disease)   . Hyperlipidemia   . Hypertension   . 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 Myocardial infarction ([place]) 11/22/04  . Peripheral vascular disease ([place])    atherosclerosis carotid artery-mild    Allergies No Known Allergies  IV Location/Drains/Wounds Patient Lines/Drains/Airways Status   Active Line/Drains/Airways    Name:   Placement date:   Placement time:   Site:   Days:   Implanted Port 12/16/13 Right Chest   12/16/13    0757    Chest   2069   Peripheral IV 08/16/19 Right Antecubital   08/16/19    0042    Antecubital   less than 1   Peripheral IV 08/16/19 Right Hand   08/16/19    0340    Hand   less than 1   Closed System Drain 1 Left;Lateral;Medial Breast Bulb (JP) 19 Fr.   11/12/13    1422    Breast   2103   Closed System Drain 2 Left;Lateral Breast Bulb (JP) 19 Fr.   11/12/13    1422    Breast   2103   Incision 12/16/13 Chest Right   12/16/13  6440     2069   Incision (Closed) 11/13/18 Eye Right   11/13/18    0735     276          Labs/Imaging Results for orders placed or performed during the hospital encounter of 08/15/19 (from the past 48 hour(s))  CBC with Differential     Status: Abnormal   Collection Time: 08/16/19 12:39 AM  Result Value Ref Range   WBC 9.1 4.0 - 10.5 K/uL   RBC 1.61 (L) 3.87 - 5.11 MIL/uL   Hemoglobin 4.2 (LL) 12.0 - 15.0 g/dL    Comment: REPEATED TO VERIFY THIS CRITICAL RESULT HAS VERIFIED AND BEEN CALLED TO ZACK,TEETER BY AISHA MOHAMED ON 09 21 2020 AT 0226, AND HAS BEEN READ BACK.     HCT 14.2 (L) 36.0 - 46.0 %   MCV 88.2 80.0 - 100.0 fL   MCH 26.1 26.0 - 34.0 pg   MCHC 29.6 (L) 30.0 - 36.0 g/dL   RDW 18.3 (H) 11.5 - 15.5 %   Platelets 230 150 - 400 K/uL   nRBC 0.5 (H) 0.0 - 0.2 %   Neutrophils  Relative % 70 %   Neutro Abs 6.4 1.7 - 7.7 K/uL   Lymphocytes Relative 14 %   Lymphs Abs 1.3 0.7 - 4.0 K/uL   Monocytes Relative 13 %   Monocytes Absolute 1.2 (H) 0.1 - 1.0 K/uL   Eosinophils Relative 1 %   Eosinophils Absolute 0.1 0.0 - 0.5 K/uL   Basophils Relative 0 %   Basophils Absolute 0.0 0.0 - 0.1 K/uL   Immature Granulocytes 2 %   Abs Immature Granulocytes 0.14 (H) 0.00 - 0.07 K/uL    Comment: Performed at [facility], [place] [address] [place], [place] [address]  Comprehensive metabolic panel     Status: Abnormal   Collection Time: 08/16/19 12:39 AM  Result Value Ref Range   Sodium 142 135 - 145 mmol/L   Potassium 3.4 (L) 3.5 - 5.1 mmol/L   Chloride 107 98 - 111 mmol/L   CO2 23 22 - 32 mmol/L   Glucose, Bld 138 (H) 70 - 99 mg/dL   BUN 42 (H) 8 - 23 mg/dL   Creatinine, Ser 1.13 (H) 0.44 - 1.00 mg/dL   Calcium 8.7 (L) 8.9 - 10.3 mg/dL   Total Protein 5.4 (L) 6.5 - 8.1 g/dL   Albumin 3.0 (L) 3.5 - 5.0 g/dL   AST 27 15 - 41 U/L   ALT 18 0 - 44 U/L   Alkaline Phosphatase 37 (L) 38 - 126 U/L   Total Bilirubin 0.2 (L) 0.3 - 1.2 mg/dL   GFR calc non Af Amer 47 (L) >60 mL/min   GFR calc Af Amer 54 (L) >60 mL/min   Anion gap 12 5 - 15    Comment: Performed at [facility], [place] [address] [place], [place] [address]  Troponin I (High Sensitivity)     Status: Abnormal   Collection Time: 08/16/19 12:39 AM  Result Value Ref Range   Troponin I (High Sensitivity) 61 (H) <18 ng/L    Comment: (NOTE) Elevated high sensitivity troponin I (hsTnI) values and significant  changes across serial measurements may suggest ACS but many other  chronic and acute conditions are known to elevate hsTnI results.  Refer to the Links section for chest pain algorithms and additional  guidance. Performed at [facility], [place] [address] [place], [place] [address]   Protime-INR  Status: None   Collection Time: 08/16/19 12:39 AM  Result  Value Ref Range   Prothrombin Time 13.5 11.4 - 15.2 seconds   INR 1.0 0.8 - 1.2    Comment: (NOTE) INR goal varies based on device and disease states. Performed at [facility], [place] [address] [place], [place] [address]   Urinalysis, Routine w reflex microscopic     Status: Abnormal   Collection Time: 08/16/19 12:40 AM  Result Value Ref Range   Color, Urine YELLOW YELLOW   APPearance CLEAR CLEAR   Specific Gravity, Urine 1.015 1.005 - 1.030   pH 5.0 5.0 - 8.0   Glucose, UA NEGATIVE NEGATIVE mg/dL   Hgb urine dipstick NEGATIVE NEGATIVE   Bilirubin Urine NEGATIVE NEGATIVE   Ketones, ur NEGATIVE NEGATIVE mg/dL   Protein, ur NEGATIVE NEGATIVE mg/dL   Nitrite NEGATIVE NEGATIVE   Leukocytes,Ua SMALL (A) NEGATIVE   RBC / HPF 0-5 0 - 5 RBC/hpf   WBC, UA 11-20 0 - 5 WBC/hpf   Bacteria, UA RARE (A) NONE SEEN   Mucus PRESENT     Comment: Performed at [facility], [place] [address], [place], [place] [address]  Blood culture (routine x 2)     Status: None (Preliminary result)   Collection Time: 08/16/19 12:40 AM   Specimen: BLOOD RIGHT FOREARM  Result Value Ref Range   Specimen Description BLOOD RIGHT FOREARM    Special Requests      BOTTLES DRAWN AEROBIC AND ANAEROBIC Blood Culture adequate volume   Culture      NO GROWTH < 12 HOURS Performed at [facility], [place] [address], [place], [place] [address]    Report Status PENDING   Lactic acid, plasma     Status: None   Collection Time: 08/16/19 12:41 AM  Result Value Ref Range   Lactic Acid, Venous 1.9 0.5 - 1.9 mmol/L    Comment: Performed at [facility], [place] [address] [place], [place] [address]  SARS Coronavirus 2 [facility] order, Performed in [facility] hospital lab) Nasopharyngeal Nasopharyngeal Swab     Status: None   Collection Time: 08/16/19 12:43 AM   Specimen: Nasopharyngeal Swab  Result Value Ref Range   SARS Coronavirus 2 NEGATIVE NEGATIVE    Comment:  (NOTE) If result is NEGATIVE SARS-CoV-2 target nucleic acids are NOT DETECTED. The SARS-CoV-2 RNA is generally detectable in upper and lower  respiratory specimens during the acute phase of infection. The lowest  concentration of SARS-CoV-2 viral copies this assay can detect is 250  copies / mL. A negative result does not preclude SARS-CoV-2 infection  and should not be used as the sole basis for treatment or other  patient management decisions.  A negative result may occur with  improper specimen collection / handling, submission of specimen other  than nasopharyngeal swab, presence of viral mutation(s) within the  areas targeted by this assay, and inadequate number of viral copies  (<250 copies / mL). A negative result must be combined with clinical  observations, patient history, and epidemiological information. If result is POSITIVE SARS-CoV-2 target nucleic acids are DETECTED. The SARS-CoV-2 RNA is generally detectable in upper and lower  respiratory specimens dur ing the acute phase of infection.  Positive  results are indicative of active infection with SARS-CoV-2.  Clinical  correlation with patient history and other diagnostic information is  necessary to determine patient infection status.  Positive results do  not rule out bacterial infection or co-infection  with other viruses. If result is PRESUMPTIVE POSTIVE SARS-CoV-2 nucleic acids MAY BE PRESENT.   A presumptive positive result was obtained on the submitted specimen  and confirmed on repeat testing.  While 2019 novel coronavirus  (SARS-CoV-2) nucleic acids may be present in the submitted sample  additional confirmatory testing may be necessary for epidemiological  and / or clinical management purposes  to differentiate between  SARS-CoV-2 and other Sarbecovirus currently known to infect humans.  If clinically indicated additional testing with an alternate test  methodology [phone]) is advised. The SARS-CoV-2 RNA is  generally  detectable in upper and lower respiratory sp ecimens during the acute  phase of infection. The expected result is Negative. Fact Sheet for Patients:  StrictlyIdeas.no Fact Sheet for Healthcare Providers: BankingDealers.co.za This test is not yet approved or cleared by the [place] FDA and has been authorized for detection and/or diagnosis of SARS-CoV-2 by FDA under an Emergency Use Authorization (EUA).  This EUA will remain in effect (meaning this test can be used) for the duration of the COVID-19 declaration under Section 564(b)(1) of the Act, 21 U.S.C. section 360bbb-3(b)(1), unless the authorization is terminated or revoked sooner. Performed at [facility], [place] [address] [place], [place] [address]   Blood culture (routine x 2)     Status: None (Preliminary result)   Collection Time: 08/16/19 12:45 AM   Specimen: BLOOD  Result Value Ref Range   Specimen Description BLOOD RIGHT ANTECUBITAL    Special Requests      BOTTLES DRAWN AEROBIC AND ANAEROBIC Blood Culture adequate volume   Culture      NO GROWTH < 12 HOURS Performed at [facility], [place] [address] [place], [place] [address]    Report Status PENDING   Type and screen     Status: None (Preliminary result)   Collection Time: 08/16/19  2:30 AM  Result Value Ref Range   ABO/RH(D) O POS    Antibody Screen NEG    Sample Expiration 08/19/2019,2359    Unit Number [id number]    Blood Component Type RED CELLS,LR    Unit division 00    Status of Unit ISSUED    Unit tag comment EMERGENCY RELEASE DR WARD    Transfusion Status OK TO TRANSFUSE    Crossmatch Result      COMPATIBLE Performed at [place] [address] [place], [place] [address]    Unit Number [id number]    Blood Component Type RED CELLS,LR    Unit division 00    Status of Unit ISSUED    Transfusion Status OK TO TRANSFUSE    Crossmatch  Result Compatible    Unit Number [id number]    Blood Component Type RED CELLS,LR    Unit division 00    Status of Unit ISSUED    Transfusion Status OK TO TRANSFUSE    Crossmatch Result Compatible   Prepare RBC     Status: None   Collection Time: 08/16/19  2:30 AM  Result Value Ref Range   Order Confirmation      ORDER PROCESSED BY BLOOD BANK Performed at [facility], [place] [address], [place], [place] [address]    Dg Chest Port 1 View  Result Date: 08/16/2019 CLINICAL DATA:  Dyspnea. History of blood transfusion, question volume overload EXAM: PORTABLE CHEST 1 VIEW COMPARISON:  Earlier the same day FINDINGS: Post treatment changes on the left including volume loss and increased density. There is radiation fibrosis and  pleural fluid on a July 2020 CT. The right lung is clear. Normal heart size for technique. Coronary stenting. IMPRESSION: 1. No acute finding.  Stable compared to earlier today. 2. Post treatment changes in the left chest. The right lung is clear with no pulmonary edema. Electronically Signed   By: Monte Fantasia M.D.   On: 08/16/2019 06:00   Dg Chest Portable 1 View  Result Date: 08/16/2019 CLINICAL DATA:  Increasing shortness of breath. History of lung cancer. EXAM: PORTABLE CHEST 1 VIEW COMPARISON:  Chest CT 06/08/2019 FINDINGS: Chronic volume loss in the left hemithorax with blunting of the costophrenic angle. Left apical pleuroparenchymal opacity which represent scarring on prior CT. Unchanged heart size and mediastinal contours allowing for differences in technique and modality. There is aortic atherosclerosis. The right lung is clear. No pulmonary edema. IMPRESSION: 1. Chronic volume loss in the left hemithorax with pleural thickening and scarring. 2. No acute abnormality demonstrated radiographically. 3.  Aortic Atherosclerosis (ICD10-I70.0). 4. Electronically Signed   By: Keith Rake M.D.   On: 08/16/2019 01:40    Pending Labs Unresulted Labs  (From admission, onward)    Start     Ordered   08/16/19 0900  CBC  Once,   STAT     08/16/19 0538   08/16/19 0900  Comprehensive metabolic panel  Once,   STAT     08/16/19 0538   08/16/19 0041  Urine culture  ONCE - STAT,   STAT     08/16/19 0041          Vitals/Pain Today's Vitals   08/16/19 0815 08/16/19 0845 08/16/19 0915 08/16/19 0930  BP: 128/60 (!) 124/53 (!) 119/55 (!) 119/55  Pulse: 93 82 81 80  Resp: (!) 25 17 20 18   Temp:      TempSrc:      SpO2: 99% 99% 99% 99%  Weight:      Height:      PainSc:        Isolation Precautions No active isolations  Medications Medications  acetaminophen (TYLENOL) tablet 650 mg (has no administration in time range)    Or  acetaminophen (TYLENOL) suppository 650 mg (has no administration in time range)  ondansetron (ZOFRAN) injection 4 mg (4 mg Intravenous Given 08/16/19 0538)  ceFEPIme (MAXIPIME) 2 g in sodium chloride 0.9 % 100 mL IVPB (has no administration in time range)  pantoprazole (PROTONIX) injection 40 mg (has no administration in time range)  atorvastatin (LIPITOR) tablet 20 mg (has no administration in time range)  exemestane (AROMASIN) tablet 25 mg (has no administration in time range)  potassium chloride SA (K-DUR) CR tablet 20 mEq (has no administration in time range)  ceFEPIme (MAXIPIME) 2 g in sodium chloride 0.9 % 100 mL IVPB (0 g Intravenous Stopped 08/16/19 0131)  metroNIDAZOLE (FLAGYL) IVPB 500 mg (0 mg Intravenous Stopped 08/16/19 0337)  sodium chloride 0.9 % bolus 1,000 mL (0 mLs Intravenous Stopped 08/16/19 0230)    And  sodium chloride 0.9 % bolus 1,000 mL (0 mLs Intravenous Stopped 08/16/19 0557)  acetaminophen (TYLENOL) tablet 1,000 mg (1,000 mg Oral Given 08/16/19 0103)  vancomycin (VANCOCIN) 1,500 mg in sodium chloride 0.9 % 500 mL IVPB (0 mg Intravenous Stopped 08/16/19 0713)  0.9 %  sodium chloride infusion (0 mL/hr Intravenous Stopped 08/16/19 0557)  potassium chloride SA (K-DUR) CR tablet 20 mEq (20 mEq  Oral Given 08/16/19 5035)    Mobility walks with device

## 2019-08-16 NOTE — Progress Notes (Signed)
A consult was received from an ED physician for cefepime and vancomycin per pharmacy dosing.  The patient's profile has been reviewed for ht/wt/allergies/indication/available labs.   A one time order has been placed for Cefepime 2 Gm and Vancomycin 1500 mg.  Further antibiotics/pharmacy consults should be ordered by admitting physician if indicated.                       Thank you, Dorrene German 08/16/2019  12:48 AM

## 2019-08-16 NOTE — ED Notes (Signed)
Pt placed on purewick for urine collection.

## 2019-08-16 NOTE — ED Notes (Signed)
Patient started coughing, congested cough, lung sounds expiratory wheezing and slight crackles. Patient stated she is nauseated, no vomiting at this time. MD paged by Network engineer.

## 2019-08-16 NOTE — ED Notes (Signed)
Pt. Documented in error see above note in chart. 

## 2019-08-16 NOTE — Progress Notes (Signed)
Multiple attempts were made today in obtaining medial records from Dr. Liliane Channel office/[facility]. This RN called the office and spoke directly with a nurse that would forward all information to medical records and have the records faxed over. After not receiving a fax a few hours later I called back and left a message for medical records to please send over records and to please return my call. A fax was also sent to Dr. Liliane Channel office with consent for release of medical records in which it indicated this was needed STAT. Still awaiting records. Will attempt to contact office again tomorrow.

## 2019-08-16 NOTE — Progress Notes (Signed)
CRITICAL VALUE ALERT  Critical Value: Troponin 561  Date & Time Notied:08/16/19 1202  Provider Notified: Dr. Grandville Silos (MD at bedside and made aware)  Orders Received/Actions taken:

## 2019-08-16 NOTE — Progress Notes (Signed)
Pharmacy Antibiotic Note  Wendy [name] is a 78 y.o. female admitted on 08/15/2019 with UTI.  Pharmacy has been consulted for cefepime dosing.  Plan: Cefepime 2 Gm IV q24h F/u scr/cultures  Height: 5\' 8"  (172.7 cm) Weight: 146 lb (66.2 kg) IBW/kg (Calculated) : 63.9  Temp (24hrs), Avg:98.6 F (37 C), Min:98.2 F (36.8 C), Max:99.9 F (37.7 C)  Recent Labs  Lab 08/16/19 0039 08/16/19 0041  WBC 9.1  --   CREATININE 1.13*  --   LATICACIDVEN  --  1.9    Estimated Creatinine Clearance: 41.4 mL/min (A) (by C-G formula based on SCr of 1.13 mg/dL (H)).    No Known Allergies  Antimicrobials this admission: 9/21 cefepime >>  9/21 flagyl >> x1 ED 9/21 vancomycin >> x1 ED  Dose adjustments this admission:   Microbiology results:  BCx:   UCx:   Sputum:    MRSA PCR:  Thank you for allowing pharmacy to be a part of this patient's care.  Dorrene German 08/16/2019 5:47 AM

## 2019-08-16 NOTE — ED Notes (Signed)
Pt. Documented in error see note in above chart.

## 2019-08-16 NOTE — Consult Note (Addendum)
Consultation  Referring Provider:   Dr. Grandville Silos   Primary Care Physician:  [name], [name] Primary Gastroenterologist: [name]  [facility])   Reason for Consultation: Anemia              HPI:   Wendy [name] is a 78 y.o. female with past medical history of hypertension, hyperlipidemia, CAD, A. fib, PVD, who presented to the ER this morning 08/16/2019 with shortness of breath and dizziness and near syncope.    Today, the patient explains that she has been anemic since July when she went for her liver biopsy but she has never really felt bad until the past 3 days when she was short of breath and dizzy and almost passed out.  Explains that she has not seen any GI bleeding though she is on slow release iron which she has been on since the end of July and her stools are always black.  Patient did stop this iron for 3 days and her stools turned brown so she resumed it and assumed this was just "the iron".      Does have some chronic right upper quadrant pain which she blames on the "cancer in my liver".  Nothing has changed recently.      Does describe being prescribed Pylera but tells me this medicine was "so horrible" that she stopped it after taking 3 days of pills.  Tells me that she "ached all over and my stomach hurt worse and I felt like I was going to die".  Patient is unaware of all of the findings from recent endoscopic procedures and just tells me she knows her "esophagus was inflamed and I had polyps".    Denies fever, chills, heartburn, reflux, change in bowel habits, nausea or vomiting.  ER course: Hemoglobin 4.2  Recent GI history: 06/29/2019 office visit with [facility] for iron deficiency anemia due to chronic blood loss, melanotic stools and history of colon polyps.  At that time patient was seen in clinic for evaluation of melanotic stools and a rapid drop in hemoglobin levels.  It was noted she is under the care of Dr. 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  That time patient presented to the hospital 06/25/2019 for a liver biopsy and labs showed a drop in hemoglobin from 11.42 weeks prior to 8.1.  She complained of melanotic stools.  She had been given 1 unit of blood.  At that time patient was scheduled for EGD and colonoscopy.  Hemoglobin 10.2.  07/06/2019 patient had an EGD and colonoscopy.  I cannot see those reports in care everywhere but patient was started on Pylera 07/12/2019.  Pathology showed tubular adenomas.  I assume patient was H. pylori positive.   Past Medical History:  Diagnosis Date   Arthritis    Breast cancer dx'd 2014   left breast   CAD (coronary artery disease)    Hyperlipidemia    Hypertension    Metastasis ([place]) dx'd 12/21/17   lung, bones and liver   Myocardial infarction ([place]) 11/22/04   Peripheral vascular disease ([place])    atherosclerosis carotid artery-mild    Past Surgical History:  Procedure Laterality Date   ABDOMINAL HYSTERECTOMY  1982   CATARACT EXTRACTION W/PHACO Right 11/13/2018   Procedure: CATARACT EXTRACTION PHACO AND INTRAOCULAR LENS PLACEMENT RIGHT EYE;  Surgeon: Baruch Goldmann, MD;  Location: [facility];  Service: Ophthalmology;  Laterality: Right;  right   KNEE ARTHROSCOPY  Left    knee   MASTECTOMY MODIFIED RADICAL Left 11/12/2013   Procedure: MASTECTOMY MODIFIED RADICAL;  Surgeon: Rolm Bookbinder, MD;  Location: [facility];  Service: General;  Laterality: Left;   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   [place] (PCI-S)  2005   PORTACATH PLACEMENT Right 12/16/2013   Procedure: INSERTION PORT-A-CATH;  Surgeon: Rolm Bookbinder, MD;  Location: [facility];  Service: General;  Laterality: Right;   TEE WITHOUT CARDIOVERSION N/A 09/26/2017   Procedure: TRANSESOPHAGEAL ECHOCARDIOGRAM (TEE);  Surgeon: Dorothy Spark, MD;  Location: [facility] ENDOSCOPY;  Service: Cardiovascular;  Laterality: N/A;     Family History  Problem Relation Age of Onset   Emphysema Mother    Heart attack Father      Social History   Tobacco Use   Smoking status: Former Smoker    Packs/day: 0.30    Types: Cigarettes    Quit date: 11/09/1983    Years since quitting: 35.7   Smokeless tobacco: Never Used  Substance Use Topics   Alcohol use: Not Currently   Drug use: No    Prior to Admission medications   Medication Sig Start Date End Date Taking? Authorizing Provider  acetaminophen (TYLENOL) 500 MG tablet Take 1,000 mg by mouth every 6 (six) hours as needed for mild pain.   Yes [provider]  AFINITOR 10 MG tablet TAKE 1 TABLET (10 MG TOTAL) BY MOUTH DAILY. Patient taking differently: Take 10 mg by mouth daily.  06/25/19  Yes [name] Lose, MD  aspirin EC 81 MG tablet Take 81 mg by mouth daily.   Yes [provider]  atorvastatin (LIPITOR) 20 MG tablet Take 1 tablet (20 mg total) by mouth at bedtime. 04/26/19  Yes [name] Lose, MD  Calcium Carb-Cholecalciferol (CALCIUM 600 + D PO) Take 1 tablet by mouth daily.   Yes [provider]  exemestane (AROMASIN) 25 MG tablet Take 1 tablet (25 mg total) by mouth daily after breakfast. 03/09/19  Yes [name] Lose, MD  ferrous sulfate (SLOW IRON) 160 (50 Fe) MG TBCR SR tablet Take 1 tablet (160 mg total) by mouth daily. 08/04/19  Yes [name] Lose, MD  hydrochlorothiazide (MICROZIDE) 12.5 MG capsule Take 1 capsule (12.5 mg total) by mouth daily. 09/01/18 08/16/19 Yes Kilroy, Doreene Burke, [name]  Multiple Vitamin (MULTIVITAMIN WITH MINERALS) TABS tablet Take 1 tablet by mouth daily.   Yes [provider]  nitroGLYCERIN (NITROSTAT) 0.4 MG SL tablet PLACE 1 TABLET (0.4 MG TOTAL) UNDER THE TONGUE EVERY 5 (FIVE) MINUTES AS NEEDED FOR CHEST PAIN. 09/28/18  Yes [name] Lose, MD  omeprazole (PRILOSEC) 40 MG capsule Take 1 capsule (40 mg total) by mouth daily. 08/04/19  Yes [name] Lose, MD  ondansetron (ZOFRAN ODT) 8 MG disintegrating tablet  Take 1 tablet (8 mg total) by mouth every 8 (eight) hours as needed for nausea or vomiting. 01/12/18  Yes [name] Lose, MD  potassium chloride SA (K-DUR) 20 MEQ tablet Take 1 tablet (20 mEq total) by mouth daily. 08/04/19  Yes [name] Lose, MD  dexamethasone (DECADRON) 0.5 MG/5ML solution Swish 63mL in mouth for 39min and spit out. Use 4 times daily for 8 weeks, start with Afinitor. Avoid eating/drinking for 1hr after rinse. Patient not taking: Reported on 08/16/2019 04/26/19   [name] Lose, MD  dexamethasone (DECADRON) 1 MG tablet TAKE 1 TABLET BY MOUTH EVERY DAY Patient not taking: Reported on 08/16/2019 05/20/19   [name] Lose, MD  oxyCODONE-acetaminophen (PERCOCET/ROXICET) 5-325 MG tablet  Take 1 tablet by mouth every 8 (eight) hours as needed for severe pain. Patient not taking: Reported on 08/16/2019 04/08/19   [name] Lose, MD    Current Facility-Administered Medications  Medication Dose Route Frequency Provider Last Rate Last Dose   0.9 %  sodium chloride infusion (Manually program via Guardrails IV Fluids)   Intravenous Once Eugenie Filler, MD       acetaminophen (TYLENOL) tablet 650 mg  650 mg Oral Q6H PRN Jani Gravel, MD       Or   acetaminophen (TYLENOL) suppository 650 mg  650 mg Rectal Q6H PRN Jani Gravel, MD       acetaminophen (TYLENOL) tablet 650 mg  650 mg Oral Once Eugenie Filler, MD       atorvastatin (LIPITOR) tablet 20 mg  20 mg Oral QHS Eugenie Filler, MD       bismuth subsalicylate (PEPTO BISMOL) chewable tablet 524 mg  524 mg Oral QID Eugenie Filler, MD       ceFEPIme (MAXIPIME) 2 g in sodium chloride 0.9 % 100 mL IVPB  2 g Intravenous Q24H Dorrene German, RPH       Chlorhexidine Gluconate Cloth 2 % PADS 6 each  6 each Topical Daily Eugenie Filler, MD   6 each at 08/16/19 1140   diphenhydrAMINE (BENADRYL) capsule 25 mg  25 mg Oral Once Eugenie Filler, MD       exemestane (AROMASIN) tablet 25 mg  25 mg Oral QPC breakfast Eugenie Filler,  MD   25 mg at 08/16/19 1141   furosemide (LASIX) injection 20 mg  20 mg Intravenous Once Eugenie Filler, MD       metroNIDAZOLE (FLAGYL) tablet 500 mg  500 mg Oral Q12H Eugenie Filler, MD       ondansetron [facility]) injection 4 mg  4 mg Intravenous Q6H PRN Jani Gravel, MD   4 mg at 08/16/19 0538   pantoprazole (PROTONIX) injection 40 mg  40 mg Intravenous Q12H Eugenie Filler, MD   40 mg at 08/16/19 1140   potassium chloride SA (K-DUR) CR tablet 20 mEq  20 mEq Oral Daily Eugenie Filler, MD   20 mEq at 08/16/19 1141   tetracycline (SUMYCIN) capsule 500 mg  500 mg Oral QID Eugenie Filler, MD        Allergies as of 08/15/2019   (No Known Allergies)     Review of Systems:    Constitutional: +weakness and fatigue Skin: No rash  Cardiovascular: No chest pain Respiratory: +DOE Gastrointestinal: See HPI and otherwise negative Genitourinary: No dysuria  Neurological:+dizziness Musculoskeletal: No new muscle or joint pain Hematologic: No bleeding Psychiatric: No history of depression or anxiety    Physical Exam:  Vital signs in last 24 hours: Temp:  [98.2 F (36.8 C)-99.9 F (37.7 C)] 98.5 F (36.9 C) (09/21 0452) Pulse Rate:  [79-111] 86 (09/21 1200) Resp:  [13-29] 20 (09/21 1200) BP: (80-143)/(39-88) 143/39 (09/21 1200) SpO2:  [98 %-100 %] 98 % (09/21 1200) Weight:  [66.2 kg] 66.2 kg (09/21 0336) Last BM Date: 08/13/19(pt reported) General:   Very Pleasant Caucasian female appears to be in NAD, Well developed, Well nourished, alert and cooperative Head:  Normocephalic and atraumatic. Eyes:   PEERL, EOMI. No icterus. Conjunctiva pink. Ears:  Normal auditory acuity. Neck:  Supple Throat: Oral cavity and pharynx without inflammation, swelling or lesion.  Lungs: Respirations even and unlabored. Lungs clear to auscultation bilaterally.   No  wheezes, crackles, or rhonchi.  Heart: Normal S1, S2. No MRG. Regular rate and rhythm. No peripheral edema, cyanosis or  pallor.  Abdomen:  Soft, nondistended,mild RUQ ttp. No rebound or guarding. Normal bowel sounds. No appreciable masses or hepatomegaly. Rectal:  Not performed.  Msk:  Symmetrical without gross deformities. Peripheral pulses intact.  Extremities:  Without edema, no deformity or joint abnormality. Normal ROM, normal sensation. Neurologic:  Alert and  oriented x4;  grossly normal neurologically.  Skin:   Dry and intact without significant lesions or rashes. Psychiatric: Demonstrates good judgement and reason without abnormal affect or behaviors.   LAB RESULTS: Recent Labs    08/16/19 0039 08/16/19 1116  WBC 9.1 5.2  HGB 4.2* 7.5*  HCT 14.2* 23.8*  PLT 230 119*   BMET Recent Labs    08/16/19 0039 08/16/19 1116  NA 142 139  K 3.4* 3.2*  CL 107 113*  CO2 23 20*  GLUCOSE 138* 107*  BUN 42* 39*  CREATININE 1.13* 0.93  CALCIUM 8.7* 7.4*   LFT Recent Labs    08/16/19 1116  PROT 4.6*  ALBUMIN 2.5*  AST 24  ALT 17  ALKPHOS 30*  BILITOT 0.5   PT/INR Recent Labs    08/16/19 0039  LABPROT 13.5  INR 1.0    STUDIES: Dg Chest Port 1 View  Result Date: 08/16/2019 CLINICAL DATA:  Dyspnea. History of blood transfusion, question volume overload EXAM: PORTABLE CHEST 1 VIEW COMPARISON:  Earlier the same day FINDINGS: Post treatment changes on the left including volume loss and increased density. There is radiation fibrosis and pleural fluid on a July 2020 CT. The right lung is clear. Normal heart size for technique. Coronary stenting. IMPRESSION: 1. No acute finding.  Stable compared to earlier today. 2. Post treatment changes in the left chest. The right lung is clear with no pulmonary edema. Electronically Signed   By: Monte Fantasia M.D.   On: 08/16/2019 06:00   Dg Chest Portable 1 View  Result Date: 08/16/2019 CLINICAL DATA:  Increasing shortness of breath. History of lung cancer. EXAM: PORTABLE CHEST 1 VIEW COMPARISON:  Chest CT 06/08/2019 FINDINGS: Chronic volume loss in  the left hemithorax with blunting of the costophrenic angle. Left apical pleuroparenchymal opacity which represent scarring on prior CT. Unchanged heart size and mediastinal contours allowing for differences in technique and modality. There is aortic atherosclerosis. The right lung is clear. No pulmonary edema. IMPRESSION: 1. Chronic volume loss in the left hemithorax with pleural thickening and scarring. 2. No acute abnormality demonstrated radiographically. 3.  Aortic Atherosclerosis (ICD10-I70.0). 4. Electronically Signed   By: Keith Rake M.D.   On: 08/16/2019 01:40   PREVIOUS ENDOSCOPIES:            See HPI   Impression / Plan:   Impression: 1.  Symptomatic anemia: Hemoglobin (9/9 9.5) 4.2 on admission--> 3 units PRBCs--> 7.5, recent EGD and colonoscopy 07/06/2019, finding of polyps and looks like H. pylori, requesting reports now, no new signs of GI bleeding per patient; consider upper GI source versus other 2.  Acute lower UTI 3.  Hypotension, elevated troponin: Received IV vancomycin in the ED, troponin thought due to demand, echo pending 4.  GERD 5.  History of breast cancer, metastatic to the liver  Plan: 1.  We will try to obtain records from recent EGD/Colonoscopy at [facility]. 2.  Continue to monitor hemoglobin with transfusion as needed less than 7.  If there are acute signs of GI bleeding please  let us know.  Otherwise continue to monitor. 3.  Patient may benefit from a repeat EGD given significant drop in hemoglobin in such a short period of time.  May also benefit from repeat treatment of H. pylori with something other than Pylera as she did not tolerate this. 4.  Please await any further recommendations from Dr. [name] later today.  Thank you for your kind consultation, we will continue to follow.  Lavone Nian [facility]  08/16/2019, 12:33 PM   I have reviewed the entire case in detail with the above APP and discussed the plan in detail.  Therefore, I agree with the  diagnoses recorded above. In addition,  I have personally interviewed and examined the patient and have personally reviewed any abdominal/pelvic CT scan images.  My additional thoughts are as follows:  This patient's dramatic drop in Hgb in less than 2 weeks after recent transfusion for anemia suggests ongoing blood loss.  Although she reports only seeing black stool while on iron, she still seems to most likely have had an obscure small bowel source of blood loss, if in fact the recent EGD and colonoscopy with [name] did not find a source.  H pylori was found, but unknown if ulcer or just gastritis.  We have made an urgent request for the reports from Dr. Liliane Channel office, and the patient's nurse will call them again today to hopefully expedite. When we can review them, if no bleeding source described, then we will proceed with small bowel video capsule study as inpatient.  Patient receiving PRBCs for severe anemia causing demand cardiac ischemia with elevated troponin.  Endoscopic procedures requiring sedation would be increased risk due to that, so we will proceed cautiously.  Will follow closely and further plans when records are available.  Nelida Meuse [name] [phone]

## 2019-08-16 NOTE — ED Notes (Signed)
Verbal order per MD to start rapid transfusion and to emergency release all 3 units of blood. Each unit verified with Christell Constant, RN. First unit started at 0340, ended at 0430, second unit started at 0431, ended at 4, third unit given at 75 and ended at 35. Patient remained afebrile, blood pressure WNL, ED MD Ward and hospitalist, Kim saw patient. Color improved, patient alert and oriented x4.

## 2019-08-16 NOTE — ED Provider Notes (Signed)
TIME SEEN: 12:38 AM  CHIEF COMPLAINT: Shortness of breath, dizziness  HPI: Patient is a 78 year old female with history of hypertension, hyperlipidemia, CAD, lung cancer, breast cancer with metastasis to bone and liver currently on daily everolimus, exemestane and Xvega who presents to the emergency department with complaints of 3 days of worsening shortness of breath, lightheadedness.  She denies any fevers but has had chills.  Has chronic cough which is unchanged.  No vomiting or diarrhea.  She appears very pale here.  Denies bloody stools or melena but is on iron tablets and already has dark stools.  Her blood pressure here is low.  She states that her blood pressure does not normally run this low but she does not know what it is on a good day.  Patient is on hydrochlorothiazide 12.5 mg daily.  ROS: See HPI Constitutional: no fever  Eyes: no drainage  ENT: no runny nose   Cardiovascular:  no chest pain  Resp:  SOB  GI: no vomiting GU: no dysuria Integumentary: no rash  Allergy: no hives  Musculoskeletal: no leg swelling  Neurological: no slurred speech ROS otherwise negative  PAST MEDICAL HISTORY/PAST SURGICAL HISTORY:  Past Medical History:  Diagnosis Date  . Arthritis   . Breast cancer dx'd 2014   left breast  . CAD (coronary artery disease)   . Hyperlipidemia   . Hypertension   . Metastasis ([place]) dx'd 12/21/17   lung, bones and liver  . Myocardial infarction ([place]) 11/22/04  . Peripheral vascular disease ([place])    atherosclerosis carotid artery-mild    MEDICATIONS:  Prior to Admission medications   Medication Sig Start Date End Date Taking? Authorizing Provider  acetaminophen (TYLENOL) 500 MG tablet Take 1,000 mg by mouth every 6 (six) hours as needed for mild pain.   Yes [provider]  AFINITOR 10 MG tablet TAKE 1 TABLET (10 MG TOTAL) BY MOUTH DAILY. Patient taking differently: Take 10 mg by mouth daily.  06/25/19  Yes [name] Lose, MD  aspirin EC 81 MG tablet  Take 81 mg by mouth daily.   Yes [provider]  atorvastatin (LIPITOR) 20 MG tablet Take 1 tablet (20 mg total) by mouth at bedtime. 04/26/19  Yes [name] Lose, MD  Calcium Carb-Cholecalciferol (CALCIUM 600 + D PO) Take 1 tablet by mouth daily.   Yes [provider]  exemestane (AROMASIN) 25 MG tablet Take 1 tablet (25 mg total) by mouth daily after breakfast. 03/09/19  Yes [name] Lose, MD  ferrous sulfate (SLOW IRON) 160 (50 Fe) MG TBCR SR tablet Take 1 tablet (160 mg total) by mouth daily. 08/04/19  Yes [name] Lose, MD  hydrochlorothiazide (MICROZIDE) 12.5 MG capsule Take 1 capsule (12.5 mg total) by mouth daily. 09/01/18 08/16/19 Yes Kilroy, Doreene Burke, [name]  Multiple Vitamin (MULTIVITAMIN WITH MINERALS) TABS tablet Take 1 tablet by mouth daily.   Yes [provider]  nitroGLYCERIN (NITROSTAT) 0.4 MG SL tablet PLACE 1 TABLET (0.4 MG TOTAL) UNDER THE TONGUE EVERY 5 (FIVE) MINUTES AS NEEDED FOR CHEST PAIN. 09/28/18  Yes [name] Lose, MD  omeprazole (PRILOSEC) 40 MG capsule Take 1 capsule (40 mg total) by mouth daily. 08/04/19  Yes [name] Lose, MD  ondansetron (ZOFRAN ODT) 8 MG disintegrating tablet Take 1 tablet (8 mg total) by mouth every 8 (eight) hours as needed for nausea or vomiting. 01/12/18  Yes [name] Lose, MD  potassium chloride SA (K-DUR) 20 MEQ tablet Take 1 tablet (20 mEq total) by mouth daily. 08/04/19  Yes  [name] Lose, MD  dexamethasone (DECADRON) 0.5 MG/5ML solution Swish 68mL in mouth for 43min and spit out. Use 4 times daily for 8 weeks, start with Afinitor. Avoid eating/drinking for 1hr after rinse. Patient not taking: Reported on 08/16/2019 04/26/19   [name] Lose, MD  dexamethasone (DECADRON) 1 MG tablet TAKE 1 TABLET BY MOUTH EVERY DAY Patient not taking: Reported on 08/16/2019 05/20/19   [name] Lose, MD  doxycycline (VIBRAMYCIN) 100 MG capsule Take 1 capsule (100 mg total) by mouth 2 (two) times daily. Patient not taking: Reported on 08/16/2019 05/04/19    [name], [name]  oxyCODONE-acetaminophen (PERCOCET/ROXICET) 5-325 MG tablet Take 1 tablet by mouth every 8 (eight) hours as needed for severe pain. Patient not taking: Reported on 08/16/2019 04/08/19   [name] Lose, MD    ALLERGIES:  No Known Allergies  SOCIAL HISTORY:  Social History   Tobacco Use  . Smoking status: Former Smoker    Packs/day: 0.30    Types: Cigarettes    Quit date: 11/09/1983    Years since quitting: 35.7  . Smokeless tobacco: Never Used  Substance Use Topics  . Alcohol use: Not Currently    FAMILY HISTORY: Family History  Problem Relation Age of Onset  . Emphysema Mother   . Heart attack Father     EXAM: BP (!) 97/48   Pulse (!) 107   Temp 99.9 F (37.7 C) (Rectal)   Resp (!) 24   SpO2 99%  CONSTITUTIONAL: Alert and oriented and responds appropriately to questions.  Elderly, pale in appearance HEAD: Normocephalic EYES: Conjunctival pallor noted, pupils appear equal, EOMI ENT: normal nose; moist mucous membranes NECK: Supple, no meningismus, no nuchal rigidity, no LAD  CARD: Regular and tachycardic; S1 and S2 appreciated; no murmurs, no clicks, no rubs, no gallops RESP: Normal chest excursion without splinting or tachypnea; breath sounds clear and equal bilaterally; no wheezes, no rhonchi, no rales, no hypoxia or respiratory distress, speaking full sentences ABD/GI: Normal bowel sounds; non-distended; soft, non-tender, no rebound, no guarding, no peritoneal signs, no hepatosplenomegaly BACK:  The back appears normal and is non-tender to palpation, there is no CVA tenderness EXT: Normal ROM in all joints; non-tender to palpation; no edema; normal capillary refill; no cyanosis, no calf tenderness or swelling    SKIN: Normal color for age and race; warm; no rash NEURO: Moves all extremities equally PSYCH: The patient's mood and manner are appropriate. Grooming and personal hygiene are appropriate.  MEDICAL DECISION MAKING: Patient here with  hypotension.  She appears very pale on exam.  She has dark stool that is guaiac positive without blood or melena.  Abdominal exam benign.  Differential includes symptomatic anemia versus sepsis.  Will start IV fluids.  She does meet sirs criteria at this time with tachycardia, tachypnea and hypotension.  Rectal temperature of 99.9.  Will give broad-spectrum antibiotics and obtain urine, chest x-ray, COVID swab, cultures.  Patient will need admission.  ED PROGRESS: Patient hemoglobin is 4.2.  She is guaiac positive.  Will give 3 units of blood.  Emergent blood has been ordered.  Lactate normal.  No leukocytosis.  She does appear to have a mild urinary tract infection with small leukocyte esterase and 11-20 white blood cells but rare bacteria.  Culture pending.  Less likely sepsis causing her symptoms.  Will discuss with medicine for admission.  4:12 AM Discussed patient's case with hospitalist, Dr. Maudie Mercury.  I have recommended admission and patient (and family if present) agree with this plan. Admitting  physician will place admission orders.   I reviewed all nursing notes, vitals, pertinent previous records, EKGs, lab and urine results, imaging (as available).   Patient's blood pressures continue to drop despite receiving 2 L of IV fluids and getting a unit of blood.  This started a massive transfusion protocol to give 3 total units rapidly.  After initiation of rapid transfusion, patient's blood pressures improved significantly as did her color and symptoms.  Dr. Maudie Mercury aware and will admit to stepdown.    EKG Interpretation  Date/Time:  Monday August 16 2019 00:54:01 EDT Ventricular Rate:  104 PR Interval:    QRS Duration: 102 QT Interval:  361 QTC Calculation: 475 R Axis:   72 Text Interpretation:  Sinus tachycardia No significant change since last tracing other than rate is faster Confirmed by Norell Brisbin, Cyril Mourning [id number]) on 08/16/2019 1:03:08 AM         CRITICAL CARE Performed by: Cyril Mourning  Frances Ambrosino   Total critical care time: 65 minutes  Critical care time was exclusive of separately billable procedures and treating other patients.  Critical care was necessary to treat or prevent imminent or life-threatening deterioration.  Critical care was time spent personally by me on the following activities: development of treatment plan with patient and/or surrogate as well as nursing, discussions with consultants, evaluation of patient's response to treatment, examination of patient, obtaining history from patient or surrogate, ordering and performing treatments and interventions, ordering and review of laboratory studies, ordering and review of radiographic studies, pulse oximetry and re-evaluation of patient's condition.     Somaya Grassi, Delice Bison, DO 08/16/19 [phone]

## 2019-08-16 NOTE — Progress Notes (Signed)
  Echocardiogram 2D Echocardiogram has been performed.  Wendy [name] 08/16/2019, 10:01 AM

## 2019-08-16 NOTE — ED Notes (Signed)
ECHO at bedside.

## 2019-08-16 NOTE — Progress Notes (Signed)
I have seen and assessed Wendy [name] and agree with Dr. Julianne Rice assessment and plan.  78 year old female history of hypertension, hyperlipidemia, coronary artery disease, paroxysmal A. fib, PVD on chronic oral iron who presents to the ED with shortness of breath, dizziness which had worsened in the presyncopal episode.  Wendy [name] on admission noted to have a hemoglobin of 4.2 from 9.5 on 08/04/2019.  Wendy [name] states recently had colonoscopy and upper endoscopy per [name] in August 2020 and was noted to have benign polyps which were removed.  Wendy [name] states she was told upper endoscopy showed irritation of her esophagus which was felt to be secondary to reflux.  Wendy [name] states she was prescribed a two-week course of Pylera and due to GI side effects only took 3 days of it.  Wendy [name] transfused 3 units packed red blood cells hemoglobin currently at 7.5 from 4.2.  Cardiac enzymes cycled with a troponin going from 61-561.  Wendy [name] likely with a demand ischemia secondary to symptomatic anemia.  Wendy [name] denies any ongoing chest pain.  EKG with no signs of ischemia noted.  2D echo done and pending.  We will cycle serial cardiac enzymes.  Transfuse 2 units of packed red blood cells due to probable demand ischemia.  Place Wendy [name] on clear liquid diet.  Place Wendy [name] back on bismuth, tetracycline, metronidazole, IV PPI.  Consult with GI for further evaluation and management.  Supportive care.  No charge.

## 2019-08-17 DIAGNOSIS — C787 Secondary malignant neoplasm of liver and intrahepatic bile duct: Secondary | ICD-10-CM | POA: Diagnosis present

## 2019-08-17 DIAGNOSIS — K921 Melena: Secondary | ICD-10-CM | POA: Diagnosis not present

## 2019-08-17 DIAGNOSIS — K2971 Gastritis, unspecified, with bleeding: Secondary | ICD-10-CM | POA: Diagnosis present

## 2019-08-17 DIAGNOSIS — I248 Other forms of acute ischemic heart disease: Secondary | ICD-10-CM | POA: Diagnosis present

## 2019-08-17 DIAGNOSIS — D6959 Other secondary thrombocytopenia: Secondary | ICD-10-CM | POA: Diagnosis present

## 2019-08-17 DIAGNOSIS — I10 Essential (primary) hypertension: Secondary | ICD-10-CM | POA: Diagnosis present

## 2019-08-17 DIAGNOSIS — I252 Old myocardial infarction: Secondary | ICD-10-CM | POA: Diagnosis not present

## 2019-08-17 DIAGNOSIS — I2510 Atherosclerotic heart disease of native coronary artery without angina pectoris: Secondary | ICD-10-CM | POA: Diagnosis present

## 2019-08-17 DIAGNOSIS — K922 Gastrointestinal hemorrhage, unspecified: Secondary | ICD-10-CM | POA: Diagnosis not present

## 2019-08-17 DIAGNOSIS — R0602 Shortness of breath: Secondary | ICD-10-CM | POA: Diagnosis not present

## 2019-08-17 DIAGNOSIS — D649 Anemia, unspecified: Secondary | ICD-10-CM

## 2019-08-17 DIAGNOSIS — K219 Gastro-esophageal reflux disease without esophagitis: Secondary | ICD-10-CM | POA: Diagnosis present

## 2019-08-17 DIAGNOSIS — T451X5A Adverse effect of antineoplastic and immunosuppressive drugs, initial encounter: Secondary | ICD-10-CM | POA: Diagnosis present

## 2019-08-17 DIAGNOSIS — I739 Peripheral vascular disease, unspecified: Secondary | ICD-10-CM | POA: Diagnosis present

## 2019-08-17 DIAGNOSIS — E876 Hypokalemia: Secondary | ICD-10-CM | POA: Diagnosis present

## 2019-08-17 DIAGNOSIS — I480 Paroxysmal atrial fibrillation: Secondary | ICD-10-CM | POA: Diagnosis present

## 2019-08-17 DIAGNOSIS — B9681 Helicobacter pylori [H. pylori] as the cause of diseases classified elsewhere: Secondary | ICD-10-CM | POA: Diagnosis present

## 2019-08-17 DIAGNOSIS — E861 Hypovolemia: Secondary | ICD-10-CM | POA: Diagnosis not present

## 2019-08-17 DIAGNOSIS — G893 Neoplasm related pain (acute) (chronic): Secondary | ICD-10-CM | POA: Diagnosis present

## 2019-08-17 DIAGNOSIS — K31819 Angiodysplasia of stomach and duodenum without bleeding: Secondary | ICD-10-CM | POA: Diagnosis not present

## 2019-08-17 DIAGNOSIS — N183 Chronic kidney disease, stage 3 (moderate): Secondary | ICD-10-CM | POA: Diagnosis present

## 2019-08-17 DIAGNOSIS — Z66 Do not resuscitate: Secondary | ICD-10-CM | POA: Diagnosis present

## 2019-08-17 DIAGNOSIS — Z79811 Long term (current) use of aromatase inhibitors: Secondary | ICD-10-CM | POA: Diagnosis not present

## 2019-08-17 DIAGNOSIS — D500 Iron deficiency anemia secondary to blood loss (chronic): Secondary | ICD-10-CM | POA: Diagnosis not present

## 2019-08-17 DIAGNOSIS — A048 Other specified bacterial intestinal infections: Secondary | ICD-10-CM | POA: Diagnosis not present

## 2019-08-17 DIAGNOSIS — I9589 Other hypotension: Secondary | ICD-10-CM

## 2019-08-17 DIAGNOSIS — E785 Hyperlipidemia, unspecified: Secondary | ICD-10-CM | POA: Diagnosis present

## 2019-08-17 DIAGNOSIS — C50919 Malignant neoplasm of unspecified site of unspecified female breast: Secondary | ICD-10-CM | POA: Diagnosis not present

## 2019-08-17 DIAGNOSIS — R7989 Other specified abnormal findings of blood chemistry: Secondary | ICD-10-CM | POA: Diagnosis not present

## 2019-08-17 DIAGNOSIS — K31811 Angiodysplasia of stomach and duodenum with bleeding: Secondary | ICD-10-CM | POA: Diagnosis present

## 2019-08-17 DIAGNOSIS — R778 Other specified abnormalities of plasma proteins: Secondary | ICD-10-CM

## 2019-08-17 DIAGNOSIS — C7951 Secondary malignant neoplasm of bone: Secondary | ICD-10-CM | POA: Diagnosis present

## 2019-08-17 DIAGNOSIS — Z20828 Contact with and (suspected) exposure to other viral communicable diseases: Secondary | ICD-10-CM | POA: Diagnosis present

## 2019-08-17 DIAGNOSIS — N390 Urinary tract infection, site not specified: Secondary | ICD-10-CM | POA: Diagnosis present

## 2019-08-17 DIAGNOSIS — D62 Acute posthemorrhagic anemia: Secondary | ICD-10-CM | POA: Diagnosis present

## 2019-08-17 LAB — BPAM RBC
Blood Product Expiration Date: 202010032359
Blood Product Expiration Date: 202010182359
Blood Product Expiration Date: 202010182359
Blood Product Expiration Date: 202010212359
Blood Product Expiration Date: 202010212359
ISSUE DATE / TIME: 202009210318
ISSUE DATE / TIME: 202009210424
ISSUE DATE / TIME: 202009210424
ISSUE DATE / TIME: 202009211353
ISSUE DATE / TIME: 202009211723
Unit Type and Rh: 5100
Unit Type and Rh: 5100
Unit Type and Rh: 5100
Unit Type and Rh: 5100
Unit Type and Rh: 9500

## 2019-08-17 LAB — BLOOD CULTURE ID PANEL (REFLEXED)

## 2019-08-17 LAB — CBC WITH DIFFERENTIAL/PLATELET
Abs Immature Granulocytes: 0.14 10*3/uL — ABNORMAL HIGH (ref 0.00–0.07)
Basophils Absolute: 0.1 10*3/uL (ref 0.0–0.1)
Basophils Relative: 1 %
Eosinophils Absolute: 0.2 10*3/uL (ref 0.0–0.5)
Eosinophils Relative: 3 %
HCT: 33 % — ABNORMAL LOW (ref 36.0–46.0)
Hemoglobin: 10.5 g/dL — ABNORMAL LOW (ref 12.0–15.0)
Immature Granulocytes: 2 %
Lymphocytes Relative: 12 %
Lymphs Abs: 0.8 10*3/uL (ref 0.7–4.0)
MCH: 29.1 pg (ref 26.0–34.0)
MCHC: 31.8 g/dL (ref 30.0–36.0)
MCV: 91.4 fL (ref 80.0–100.0)
Monocytes Absolute: 0.7 10*3/uL (ref 0.1–1.0)
Monocytes Relative: 11 %
Neutro Abs: 4.6 10*3/uL (ref 1.7–7.7)
Neutrophils Relative %: 71 %
Platelets: 128 10*3/uL — ABNORMAL LOW (ref 150–400)
RBC: 3.61 MIL/uL — ABNORMAL LOW (ref 3.87–5.11)
RDW: 16.5 % — ABNORMAL HIGH (ref 11.5–15.5)
WBC: 6.4 10*3/uL (ref 4.0–10.5)
nRBC: 0.9 % — ABNORMAL HIGH (ref 0.0–0.2)

## 2019-08-17 LAB — BASIC METABOLIC PANEL
Anion gap: 7 (ref 5–15)
BUN: 32 mg/dL — ABNORMAL HIGH (ref 8–23)
CO2: 20 mmol/L — ABNORMAL LOW (ref 22–32)
Calcium: 7.9 mg/dL — ABNORMAL LOW (ref 8.9–10.3)
Chloride: 111 mmol/L (ref 98–111)
Creatinine, Ser: 0.91 mg/dL (ref 0.44–1.00)
GFR calc Af Amer: >60 mL/min (ref >60)
GFR calc non Af Amer: >60 mL/min (ref >60)
Glucose, Bld: 101 mg/dL — ABNORMAL HIGH (ref 70–99)
Potassium: 3.4 mmol/L — ABNORMAL LOW (ref 3.5–5.1)
Sodium: 138 mmol/L (ref 135–145)

## 2019-08-17 LAB — TYPE AND SCREEN
ABO/RH(D): O POS
Antibody Screen: NEGATIVE
Unit division: 0
Unit division: 0
Unit division: 0
Unit division: 0
Unit division: 0

## 2019-08-17 LAB — HEMOGLOBIN AND HEMATOCRIT, BLOOD
HCT: 32.7 % — ABNORMAL LOW (ref 36.0–46.0)
Hemoglobin: 10.6 g/dL — ABNORMAL LOW (ref 12.0–15.0)

## 2019-08-17 LAB — TROPONIN I (HIGH SENSITIVITY)
Troponin I (High Sensitivity): 262 ng/L (ref <18)
Troponin I (High Sensitivity): 229 ng/L (ref <18)

## 2019-08-17 MED ORDER — PEG-KCL-NACL-NASULF-NA ASC-C 100 G PO SOLR
0.5000 | Freq: Once | ORAL | Status: AC
  Administered 2019-08-17: 17:00:00 100 g via ORAL
  Filled 2019-08-17: qty 1

## 2019-08-17 MED ORDER — FLUTICASONE PROPIONATE 50 MCG/ACT NA SUSP
2.0000 | Freq: Every day | NASAL | Status: DC
  Filled 2019-08-17: qty 16

## 2019-08-17 MED ORDER — LORATADINE 10 MG PO TABS
10.0000 mg | ORAL_TABLET | Freq: Every day | ORAL | Status: DC
  Administered 2019-08-17 – 2019-08-19 (×2): 10 mg via ORAL
  Filled 2019-08-17 (×5): qty 1

## 2019-08-17 MED ORDER — SODIUM CHLORIDE 0.9 % IV SOLN
2.0000 g | Freq: Every day | INTRAVENOUS | Status: DC
  Administered 2019-08-17 – 2019-08-18 (×2): 2 g via INTRAVENOUS
  Filled 2019-08-17 (×2): qty 20

## 2019-08-17 NOTE — Progress Notes (Signed)
[facility] Progress Note  Chief Complaint: anemia  History:  Patient without reports of overt GI bleeding.  No BM in several days.  Bloated, nausea, no vomiting.  ROS: Cardiovascular:  No chest pain   Respiratory:  No dyspnea Urinary:  No dysuria  Objective:   Current Facility-Administered Medications:  .  acetaminophen (TYLENOL) tablet 650 mg, 650 mg, Oral, Q6H PRN, 650 mg at 08/17/19 0456 **OR** acetaminophen (TYLENOL) suppository 650 mg, 650 mg, Rectal, Q6H PRN, Jani Gravel, MD .  atorvastatin (LIPITOR) tablet 20 mg, 20 mg, Oral, QHS, Eugenie Filler, MD, 20 mg at 08/16/19 2158 .  bismuth subsalicylate (PEPTO BISMOL) chewable tablet 524 mg, 524 mg, Oral, QID, Eugenie Filler, MD, 524 mg at 08/17/19 0904 .  cefTRIAXone (ROCEPHIN) 2 g in sodium chloride 0.9 % 100 mL IVPB, 2 g, Intravenous, Q0600, Eugenie Filler, MD, Stopped at 08/17/19 0720 .  Chlorhexidine Gluconate Cloth 2 % PADS 6 each, 6 each, Topical, Daily, Eugenie Filler, MD, 6 each at 08/17/19 [phone] .  exemestane (AROMASIN) tablet 25 mg, 25 mg, Oral, QPC breakfast, Eugenie Filler, MD, 25 mg at 08/17/19 0835 .  metroNIDAZOLE (FLAGYL) tablet 500 mg, 500 mg, Oral, Q12H, Eugenie Filler, MD, 500 mg at 08/17/19 0904 .  ondansetron (ZOFRAN) injection 4 mg, 4 mg, Intravenous, Q6H PRN, Jani Gravel, MD, 4 mg at 08/16/19 0538 .  pantoprazole (PROTONIX) injection 40 mg, 40 mg, Intravenous, Q12H, Eugenie Filler, MD, 40 mg at 08/17/19 0904 .  potassium chloride SA (K-DUR) CR tablet 20 mEq, 20 mEq, Oral, Daily, Eugenie Filler, MD, 20 mEq at 08/17/19 0904 .  tetracycline (SUMYCIN) capsule 500 mg, 500 mg, Oral, QID, Eugenie Filler, MD, 500 mg at 08/17/19 0904  . cefTRIAXone (ROCEPHIN)  IV Stopped (08/17/19 0720)     Vital signs in last 24 hrs: Vitals:   08/17/19 0900 08/17/19 1000  BP: (!) 132/55 (!) 140/53  Pulse: 78 81  Resp: 15 16  Temp:    SpO2: 97% 98%    Intake/Output Summary (Last 24 hours) at  08/17/2019 1050 Last data filed at 08/17/2019 0644 Gross per 24 hour  Intake 1115.92 ml  Output 1600 ml  Net -484.08 ml     Physical Exam   HEENT: sclera anicteric, oral mucosa without lesions  Neck: supple, no thyromegaly, JVD or lymphadenopathy  Cardiac: RRR without murmurs, S1S2 heard, no peripheral edema  Pulm: clear to auscultation bilaterally, normal RR and effort noted  Abdomen: soft, no tenderness, with active bowel sounds, mild soft distension. No guarding or palpable hepatosplenomegaly  Skin; warm and dry, no jaundice, + pale  Recent Labs:  CBC Latest Ref Rng & Units 08/17/2019 08/16/2019 08/16/2019  WBC 4.0 - 10.5 K/uL 6.4 5.6 5.2  Hemoglobin 12.0 - 15.0 g/dL 10.5(L) 10.2(L) 7.5(L)  Hematocrit 36.0 - 46.0 % 33.0(L) 30.9(L) 23.8(L)  Platelets 150 - 400 K/uL 128(L) 113(L) 119(L)    Recent Labs  Lab 08/16/19 0039  INR 1.0   CMP Latest Ref Rng & Units 08/17/2019 08/16/2019 08/16/2019  Glucose 70 - 99 mg/dL 101(H) 98 107(H)  BUN 8 - 23 mg/dL 32(H) 36(H) 39(H)  Creatinine 0.44 - 1.00 mg/dL 0.91 1.01(H) 0.93  Sodium 135 - 145 mmol/L 138 139 139  Potassium 3.5 - 5.1 mmol/L 3.4(L) 3.6 3.2(L)  Chloride 98 - 111 mmol/L 111 111 113(H)  CO2 22 - 32 mmol/L 20(L) 21(L) 20(L)  Calcium 8.9 - 10.3 mg/dL 7.9(L) 7.7(L) 7.4(L)  Total Protein  6.5 - 8.1 g/dL - - 4.6(L)  Total Bilirubin 0.3 - 1.2 mg/dL - - 0.5  Alkaline Phos 38 - 126 U/L - - 30(L)  AST 15 - 41 U/L - - 24  ALT 0 - 44 U/L - - 17     Records from Dr. Liliane Channel EGD and colonoscopy report of 07/05/19 rec'd and reviewed  Mild gastritis Few benign colon polyps O/w normal studies  @ASSESSMENTPLANBEGIN @ Assessment: Profound anemia, normal iron studies in early August.  No hemolysis labs done, but does not seem to have reasons for that.  Worrisome for intermittent small bowel bleeding (in absence of other obvious bleeding sources).   Reassuring that her Hgb has responded well to Tfx and remained stable since  yesterday.  I still feel SB video capsule study is warranted.  Offered her inpatient (do tomorrow, read Thursday) vs outpatient soon with [facility].  She feels inpatient would be easier, especially as regards undergoing the bowel prep.   Plan: Regular diet for lunch, then clears for dinner and until [place] Bowel prep this evening SB VCE tomorrow (equipment will be brought from [facility])  Total time 25 minutes   Nelida Meuse III Office: [phone]

## 2019-08-17 NOTE — Progress Notes (Signed)
PHARMACY - PHYSICIAN COMMUNICATION CRITICAL VALUE ALERT - BLOOD CULTURE IDENTIFICATION (BCID)  Wendy [name] is an 78 y.o. female who presented to [facility] on 08/15/2019 with a chief complaint of SOB  Assessment:  Patient with one of four bottles growing Staphylococccus species with no resistance noted.   (include suspected source if known)  Name of physician (or Provider) Contacted: none  Current antibiotics: Ceftriaxone 1gm iv q24hr  Changes to prescribed antibiotics recommended:  Will change ceftriaxone to 2gm iv q24hr, in case a true bacteremia vs contamination which is more likely.  Consider repeat cultures.    Results for orders placed or performed during the hospital encounter of 08/15/19  Blood Culture ID Panel (Reflexed) (Collected: 08/16/2019 12:45 AM)  Result Value Ref Range   Enterococcus species NOT DETECTED NOT DETECTED   Listeria monocytogenes NOT DETECTED NOT DETECTED   Staphylococcus species DETECTED (A) NOT DETECTED   Staphylococcus aureus (BCID) NOT DETECTED NOT DETECTED   Methicillin resistance NOT DETECTED NOT DETECTED   Streptococcus species NOT DETECTED NOT DETECTED   Streptococcus agalactiae NOT DETECTED NOT DETECTED   Streptococcus pneumoniae NOT DETECTED NOT DETECTED   Streptococcus pyogenes NOT DETECTED NOT DETECTED   Acinetobacter baumannii NOT DETECTED NOT DETECTED   Enterobacteriaceae species NOT DETECTED NOT DETECTED   Enterobacter cloacae complex NOT DETECTED NOT DETECTED   Escherichia coli NOT DETECTED NOT DETECTED   Klebsiella oxytoca NOT DETECTED NOT DETECTED   Klebsiella pneumoniae NOT DETECTED NOT DETECTED   Proteus species NOT DETECTED NOT DETECTED   Serratia marcescens NOT DETECTED NOT DETECTED   Haemophilus influenzae NOT DETECTED NOT DETECTED   Neisseria meningitidis NOT DETECTED NOT DETECTED   Pseudomonas aeruginosa NOT DETECTED NOT DETECTED   Candida albicans NOT DETECTED NOT DETECTED   Candida glabrata NOT DETECTED NOT DETECTED   Candida krusei NOT DETECTED NOT DETECTED   Candida parapsilosis NOT DETECTED NOT DETECTED   Candida tropicalis NOT DETECTED NOT DETECTED    Nani Skillern Crowford 08/17/2019  6:12 AM

## 2019-08-17 NOTE — Progress Notes (Signed)
PROGRESS NOTE    Wendy [name]  ZOX:[id number] DOB: May 17, 1941 DOA: 08/15/2019 PCP: [name], [name]    Brief Narrative:   78 year old female history of hypertension, hyperlipidemia, coronary artery disease, paroxysmal A. fib, PVD on chronic oral iron who presents to the ED with shortness of breath, dizziness which had worsened in the presyncopal episode.  Patient on admission noted to have a hemoglobin of 4.2 from 9.5 on 08/04/2019.  Patient states recently had colonoscopy and upper endoscopy per Wendy [name] in August 2020 and was noted to have benign polyps which were removed.  Patient states she was told upper endoscopy showed irritation of her esophagus which was felt to be secondary to reflux.  Patient states she was prescribed a two-week course of Pylera and due to GI side effects only took 3 days of it.  Patient transfused 3 units packed red blood cells hemoglobin currently at 7.5 from 4.2.  Cardiac enzymes cycled with a troponin going from 61-561.  Patient likely with a demand ischemia secondary to symptomatic anemia.  Patient denies any ongoing chest pain.  EKG with no signs of ischemia noted.  2D echo done and patient transfused an additional 2 units packed red blood cells.  Patient placed on IV PPI twice daily, bismuth, tetracycline, metronidazole.  GI consulted for further evaluation and management.  Trying to obtain records from patient's primary gastroenterologist, Wendy [name].   Assessment & Plan:   Principal Problem:   Symptomatic anemia Active Problems:   Essential hypertension   Hyperlipidemia   CKD (chronic kidney disease), stage III (HCC)   Hypotension due to hypovolemia   Acute blood loss anemia   Elevated troponin   Demand ischemia (HCC)   Gastroesophageal reflux disease   Metastatic breast cancer (HCC)   Hypokalemia  1 symptomatic anemia/acute blood loss anemia Patient presented with a hemoglobin down to 4.2 from 9.5 on 08/04/2019.  Patient did state had a  recent colonoscopy and upper endoscopy per her primary gastroenterologist, Wendy [name] in August 2020 noted to have some benign polyps which were removed and irritation in her esophagus felt secondary to reflux and prescribed a two-week course of Pylera which she only took 3 days of it due to GI side effects.  Concern for upper GI bleed.  Patient states was on oral iron and noted to have melanotic stools however discontinued iron tablets and stools were brown.  Patient with an acute drop in her hemoglobin concern for GI bleed.  Patient status post 5 units transfusion of packed red blood cells with hemoglobin at 10.5 this morning from 4.2 on admission.  Patient denies any overt bleeding.  Continue Protonix 40 mg IV every 12 hours, metronidazole, tetracycline, bismuth.  Continue clear liquids for now.  Try to obtain records of recent upper endoscopy, pathology report, colonoscopy from primary gastroenterologist office since 08/16/2019.  GI consulted and are following.  2.  Elevated troponin/probable demand ischemia Patient had presented with worsening shortness of breath and presyncopal symptoms likely secondary to problem #1.  Cardiac enzymes cycled with high-sensitivity troponin going from 61-> 561>> 653>>> 626.  Likely demand ischemia secondary to problem #1.  EKG personally reviewed and interpreted showing a sinus tachycardia with no ischemic changes noted.  2D echo was obtained with a EF of 30 to 65%, mildly increased left ventricular posterior wall thickness with no LVH, impaired relaxation pattern of LV diastolic filling, global right ventricle with normal systolic function, pericardial effusion is circumferential, mild tricuspid valvular regurgitation, no wall motion  abnormalities.  Patient denies any chest pain.  Patient's hemoglobin was 4.2 on admission and now at 10.5 this morning.  Patient status post 5 units transfusion of packed red blood cells.  Continue serial troponins to ensure decreasing troponin.   No further cardiac work-up needed at this time.  3.  Gastroesophageal reflux disease PPI.  4.  UTI Urine cultures pending.  Continue IV Rocephin.  5.  1/4 blood cultures with coagulase-negative staph Likely a contaminant.  Patient afebrile.  Patient on IV Rocephin for UTI.  If patient spikes a fever or worsening leukocytosis will recommend repeat blood cultures however will follow for now.  6.  Hypotension Likely secondary to problem #1.  Improved with transfusion of packed red blood cells and supportive care.  Follow.  7.  Hypertension Continue to hold antihypertensive medications as patient had presented with hypotension.  Follow.  8.  Hyperlipidemia/coronary artery disease/peripheral vascular disease Discontinued secondary to problem #1.  Continue statin.  9.  History of metastatic breast cancer Patient continued on Aromasin.  Oncology notified via epic of patient's admission.  Outpatient follow-up.  10.  Hypokalemia Patient on oral daily potassium supplementation.  Labs pending.   DVT prophylaxis: SCDs Code Status: DNR Family Communication: Updated patient.  No family at bedside. Disposition Plan: Remain in stepdown unit today.   Consultants:   Gastroenterology: Dr. [name] III 08/16/2019  Procedures:  2D echo 08/16/2019  CXR 08/16/2019  Transfused 5 units packed red blood cells 08/16/2019  Antimicrobials:  IV Rocephin 08/16/2019   Subjective: Patient sitting up in chair.  Patient states feeling better after blood transfusions.  States shortness of breath improving.  Denies any chest pain.  No abdominal pain.  Tolerating clears.  Patient states suprapubic lower abdominal pain improved.  Patient denies any melanotic stools.  No bleeding.  Patient states has not had a bowel movement in several days.  Objective: Vitals:   08/17/19 0500 08/17/19 0600 08/17/19 0701 08/17/19 0800  BP: (!) 142/54 (!) 137/45  (!) 115/42  Pulse: 80 78  72  Resp: 20 18  16   Temp:   97.7  F (36.5 C)   TempSrc:   Oral   SpO2: 96% 96%  95%  Weight:      Height:        Intake/Output Summary (Last 24 hours) at 08/17/2019 0953 Last data filed at 08/17/2019 0644 Gross per 24 hour  Intake 1115.92 ml  Output 1600 ml  Net -484.08 ml   Filed Weights   08/16/19 0336 08/17/19 0451  Weight: 66.2 kg 70.9 kg    Examination:  General exam: Appears calm and comfortable  Respiratory system: Clear to auscultation. Respiratory effort normal. Cardiovascular system: S1 & S2 heard, RRR. No JVD, murmurs, rubs, gallops or clicks. No pedal edema. Gastrointestinal system: Abdomen is nondistended, soft and nontender. No organomegaly or masses felt. Normal bowel sounds heard. Central nervous system: Alert and oriented. No focal neurological deficits. Extremities: Symmetric 5 x 5 power. Skin: No rashes, lesions or ulcers Psychiatry: Judgement and insight appear normal. Mood & affect appropriate.     Data Reviewed: I have personally reviewed following labs and imaging studies  CBC: Recent Labs  Lab 08/16/19 0039 08/16/19 1116 08/16/19 2256 08/17/19 0918  WBC 9.1 5.2 5.6 6.4  NEUTROABS 6.4  --  3.6 4.6  HGB 4.2* 7.5* 10.2* 10.5*  HCT 14.2* 23.8* 30.9* 33.0*  MCV 88.2 88.5 88.5 91.4  PLT 230 119* 113* 767*   Basic Metabolic Panel: Recent Labs  Lab 08/16/19 0039 08/16/19 1116 08/16/19 2104  NA 142 139 139  K 3.4* 3.2* 3.6  CL 107 113* 111  CO2 23 20* 21*  GLUCOSE 138* 107* 98  BUN 42* 39* 36*  CREATININE 1.13* 0.93 1.01*  CALCIUM 8.7* 7.4* 7.7*   GFR: Estimated Creatinine Clearance: 46.3 mL/min (A) (by C-G formula based on SCr of 1.01 mg/dL (H)). Liver Function Tests: Recent Labs  Lab 08/16/19 0039 08/16/19 1116  AST 27 24  ALT 18 17  ALKPHOS 37* 30*  BILITOT 0.2* 0.5  PROT 5.4* 4.6*  ALBUMIN 3.0* 2.5*   No results for input(s): LIPASE, AMYLASE in the last 168 hours. No results for input(s): AMMONIA in the last 168 hours. Coagulation Profile: Recent Labs    Lab 08/16/19 0039  INR 1.0   Cardiac Enzymes: No results for input(s): CKTOTAL, CKMB, CKMBINDEX, TROPONINI in the last 168 hours. BNP (last 3 results) No results for input(s): PROBNP in the last 8760 hours. HbA1C: No results for input(s): HGBA1C in the last 72 hours. CBG: No results for input(s): GLUCAP in the last 168 hours. Lipid Profile: No results for input(s): CHOL, HDL, LDLCALC, TRIG, CHOLHDL, LDLDIRECT in the last 72 hours. Thyroid Function Tests: No results for input(s): TSH, T4TOTAL, FREET4, T3FREE, THYROIDAB in the last 72 hours. Anemia Panel: No results for input(s): VITAMINB12, FOLATE, FERRITIN, TIBC, IRON, RETICCTPCT in the last 72 hours. Sepsis Labs: Recent Labs  Lab 08/16/19 0041  LATICACIDVEN 1.9    Recent Results (from the past 240 hour(s))  Blood culture (routine x 2)     Status: None (Preliminary result)   Collection Time: 08/16/19 12:40 AM   Specimen: BLOOD RIGHT FOREARM  Result Value Ref Range Status   Specimen Description BLOOD RIGHT FOREARM  Final   Special Requests   Final    BOTTLES DRAWN AEROBIC AND ANAEROBIC Blood Culture adequate volume   Culture   Final    NO GROWTH 1 DAY Performed at [facility], [place] [address], [place], [place] [address]    Report Status PENDING  Incomplete  SARS Coronavirus 2 [facility] order, Performed in [facility] hospital lab) Nasopharyngeal Nasopharyngeal Swab     Status: None   Collection Time: 08/16/19 12:43 AM   Specimen: Nasopharyngeal Swab  Result Value Ref Range Status   SARS Coronavirus 2 NEGATIVE NEGATIVE Final    Comment: (NOTE) If result is NEGATIVE SARS-CoV-2 target nucleic acids are NOT DETECTED. The SARS-CoV-2 RNA is generally detectable in upper and lower  respiratory specimens during the acute phase of infection. The lowest  concentration of SARS-CoV-2 viral copies this assay can detect is 250  copies / mL. A negative result does not preclude SARS-CoV-2 infection  and should not be used as  the sole basis for treatment or other  patient management decisions.  A negative result may occur with  improper specimen collection / handling, submission of specimen other  than nasopharyngeal swab, presence of viral mutation(s) within the  areas targeted by this assay, and inadequate number of viral copies  (<250 copies / mL). A negative result must be combined with clinical  observations, patient history, and epidemiological information. If result is POSITIVE SARS-CoV-2 target nucleic acids are DETECTED. The SARS-CoV-2 RNA is generally detectable in upper and lower  respiratory specimens dur ing the acute phase of infection.  Positive  results are indicative of active infection with SARS-CoV-2.  Clinical  correlation with patient history and other diagnostic information is  necessary to determine  patient infection status.  Positive results do  not rule out bacterial infection or co-infection with other viruses. If result is PRESUMPTIVE POSTIVE SARS-CoV-2 nucleic acids MAY BE PRESENT.   A presumptive positive result was obtained on the submitted specimen  and confirmed on repeat testing.  While 2019 novel coronavirus  (SARS-CoV-2) nucleic acids may be present in the submitted sample  additional confirmatory testing may be necessary for epidemiological  and / or clinical management purposes  to differentiate between  SARS-CoV-2 and other Sarbecovirus currently known to infect humans.  If clinically indicated additional testing with an alternate test  methodology [phone]) is advised. The SARS-CoV-2 RNA is generally  detectable in upper and lower respiratory sp ecimens during the acute  phase of infection. The expected result is Negative. Fact Sheet for Patients:  StrictlyIdeas.no Fact Sheet for Healthcare Providers: BankingDealers.co.za This test is not yet approved or cleared by the [place] FDA and has been authorized for  detection and/or diagnosis of SARS-CoV-2 by FDA under an Emergency Use Authorization (EUA).  This EUA will remain in effect (meaning this test can be used) for the duration of the COVID-19 declaration under Section 564(b)(1) of the Act, 21 U.S.C. section 360bbb-3(b)(1), unless the authorization is terminated or revoked sooner. Performed at [facility], [place] [address] [place], [place] [address]   Blood culture (routine x 2)     Status: None (Preliminary result)   Collection Time: 08/16/19 12:45 AM   Specimen: BLOOD  Result Value Ref Range Status   Specimen Description BLOOD RIGHT ANTECUBITAL  Final   Special Requests   Final    BOTTLES DRAWN AEROBIC AND ANAEROBIC Blood Culture adequate volume   Culture  Setup Time   Final    GRAM POSITIVE COCCI ANAEROBIC BOTTLE ONLY Organism ID to follow CRITICAL RESULT CALLED TO, READ BACK BY AND VERIFIED WITH: PHRMD J GRIMFLEY @0603  08/17/19 BY S GEZAHEGN    Culture   Final    NO GROWTH 1 DAY Performed at [facility], [place] [address] [place], [place] [address]    Report Status PENDING  Incomplete  Blood Culture ID Panel (Reflexed)     Status: Abnormal   Collection Time: 08/16/19 12:45 AM  Result Value Ref Range Status   Enterococcus species NOT DETECTED NOT DETECTED Final   Listeria monocytogenes NOT DETECTED NOT DETECTED Final   Staphylococcus species DETECTED (A) NOT DETECTED Final    Comment: Methicillin (oxacillin) susceptible coagulase negative staphylococcus. Possible blood culture contaminant (unless isolated from more than one blood culture draw or clinical case suggests pathogenicity). No antibiotic treatment is indicated for blood  culture contaminants. CRITICAL RESULT CALLED TO, READ BACK BY AND VERIFIED WITH: PHRMD J GRIMFLEY @0603  08/17/19 BY S GEZAHEGN    Staphylococcus aureus (BCID) NOT DETECTED NOT DETECTED Final   Methicillin resistance NOT DETECTED NOT DETECTED Final   Streptococcus species NOT  DETECTED NOT DETECTED Final   Streptococcus agalactiae NOT DETECTED NOT DETECTED Final   Streptococcus pneumoniae NOT DETECTED NOT DETECTED Final   Streptococcus pyogenes NOT DETECTED NOT DETECTED Final   Acinetobacter baumannii NOT DETECTED NOT DETECTED Final   Enterobacteriaceae species NOT DETECTED NOT DETECTED Final   Enterobacter cloacae complex NOT DETECTED NOT DETECTED Final   Escherichia coli NOT DETECTED NOT DETECTED Final   Klebsiella oxytoca NOT DETECTED NOT DETECTED Final   Klebsiella pneumoniae NOT DETECTED NOT DETECTED Final   Proteus species NOT DETECTED NOT DETECTED Final   Serratia marcescens NOT DETECTED NOT  DETECTED Final   Haemophilus influenzae NOT DETECTED NOT DETECTED Final   Neisseria meningitidis NOT DETECTED NOT DETECTED Final   Pseudomonas aeruginosa NOT DETECTED NOT DETECTED Final   Candida albicans NOT DETECTED NOT DETECTED Final   Candida glabrata NOT DETECTED NOT DETECTED Final   Candida krusei NOT DETECTED NOT DETECTED Final   Candida parapsilosis NOT DETECTED NOT DETECTED Final   Candida tropicalis NOT DETECTED NOT DETECTED Final    Comment: Performed at [facility], [place] [address] [place], [place] [address]  MRSA PCR Screening     Status: None   Collection Time: 08/16/19 10:44 AM   Specimen: Nasopharyngeal  Result Value Ref Range Status   MRSA by PCR NEGATIVE NEGATIVE Final    Comment:        The GeneXpert MRSA Assay (FDA approved for NASAL specimens only), is one component of a comprehensive MRSA colonization surveillance program. It is not intended to diagnose MRSA infection nor to guide or monitor treatment for MRSA infections. Performed at [facility], [place] [address] [place], [place] [address]          Radiology Studies: Dg Chest Port 1 View  Result Date: 08/16/2019 CLINICAL DATA:  Dyspnea. History of blood transfusion, question volume overload EXAM: PORTABLE CHEST 1 VIEW COMPARISON:  Earlier the same  day FINDINGS: Post treatment changes on the left including volume loss and increased density. There is radiation fibrosis and pleural fluid on a July 2020 CT. The right lung is clear. Normal heart size for technique. Coronary stenting. IMPRESSION: 1. No acute finding.  Stable compared to earlier today. 2. Post treatment changes in the left chest. The right lung is clear with no pulmonary edema. Electronically Signed   By: Monte Fantasia M.D.   On: 08/16/2019 06:00   Dg Chest Portable 1 View  Result Date: 08/16/2019 CLINICAL DATA:  Increasing shortness of breath. History of lung cancer. EXAM: PORTABLE CHEST 1 VIEW COMPARISON:  Chest CT 06/08/2019 FINDINGS: Chronic volume loss in the left hemithorax with blunting of the costophrenic angle. Left apical pleuroparenchymal opacity which represent scarring on prior CT. Unchanged heart size and mediastinal contours allowing for differences in technique and modality. There is aortic atherosclerosis. The right lung is clear. No pulmonary edema. IMPRESSION: 1. Chronic volume loss in the left hemithorax with pleural thickening and scarring. 2. No acute abnormality demonstrated radiographically. 3.  Aortic Atherosclerosis (ICD10-I70.0). 4. Electronically Signed   By: Keith Rake M.D.   On: 08/16/2019 01:40        Scheduled Meds:  atorvastatin  20 mg Oral QHS   bismuth subsalicylate  315 mg Oral QID   Chlorhexidine Gluconate Cloth  6 each Topical Daily   exemestane  25 mg Oral QPC breakfast   metroNIDAZOLE  500 mg Oral Q12H   pantoprazole (PROTONIX) IV  40 mg Intravenous Q12H   potassium chloride SA  20 mEq Oral Daily   tetracycline  500 mg Oral QID   Continuous Infusions:  cefTRIAXone (ROCEPHIN)  IV Stopped (08/17/19 0720)     LOS: 0 days    Time spent: 40 minutes    Irine Seal, MD [facility]  If 7PM-7AM, please contact night-coverage [email or web address] 08/17/2019, 9:53 AM

## 2019-08-17 NOTE — Progress Notes (Addendum)
HEMATOLOGY-ONCOLOGY PROGRESS NOTE  SUBJECTIVE: Wendy [name] has been admitted due to symptomatic anemia.  Admission, her hemoglobin was 4.2.  Has received a total of 5 units of packed red blood cells this admission.  Hemoglobin has now corrected to 10.5 this morning.  Patient reports that she feels much better.  She is not having any chest pain or shortness of breath.  She did not notice any bleeding.  She stated that she had black stools when she was taking oral iron.  However, she stopped this for a few days and her stools were no longer black.  Had a recent upper endoscopy and colonoscopy and the patient reports that she had several polyps which were noncancerous.  She was also prescribed Pylera could not initially entire course of therapy secondary to side effects.  GI has seen the patient and has recommended capsule endoscopy tomorrow.  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 [name], T3N2 (5/10 LN positive) stage IIIa IDC grade 1, ER/PR 100%, TSH 14% HER-2 negative PET/CT 12/20/2013 negative for metastases   12/17/2013 - 04/02/2014 Chemotherapy   Taxotere Cytoxan every 3 weeks for 6 cycles   05/11/2014 - 06/27/2014 Radiation Therapy   Adjuvant radiation therapy to chest wall and lymph nodes   07/06/2014 -  Anti-estrogen oral therapy   Arimidex 1 mg daily   12/21/2017 Imaging   Metastatic lesions noted in the lungs bilaterally, bilateral hilar and mediastinal lymph nodes, left pleural effusion, multiple thoracic vertebral metastases,  heterogeneous lesions in the liver   12/22/2017 Procedure   Pleural Effusion Thoracentesis: Metastatic Breast cancer ER PR Positive Her 2 Neg   12/29/2017 Treatment Plan Change   Ibrance, Faslodex and Xgeva   03/09/2019 Treatment Plan Change   Everolimus and Exemestane   06/25/2019 Pathology Results   Liver biopsy: 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, ER 100%, PR 0%, Ki-67 10%, HER-2 -1+ by IHC      REVIEW OF SYSTEMS:   Constitutional: Denies fevers, chills Eyes: Denies blurriness of vision Ears, nose, mouth, throat, and face: Denies mucositis or sore throat Respiratory: Denies cough, dyspnea or wheezes Cardiovascular: Denies palpitation, chest discomfort Gastrointestinal:  Denies nausea, heartburn or change in bowel habits Skin: Denies abnormal skin rashes Lymphatics: Denies new lymphadenopathy or easy bruising Neurological:Denies numbness, tingling or new weaknesses Behavioral/Psych: Mood is stable, no new changes  Extremities: No lower extremity edema All other systems were reviewed with the patient and are negative.  I have reviewed the past medical history, past surgical history, social history and family history with the patient and they are unchanged from previous note.   PHYSICAL EXAMINATION: ECOG PERFORMANCE STATUS: 1 - Symptomatic but completely ambulatory  Vitals:   08/17/19 0900 08/17/19 1000  BP: (!) 132/55 (!) 140/53  Pulse: 78 81  Resp: 15 16  Temp:    SpO2: 97% 98%   Filed Weights   08/16/19 0336 08/17/19 0451  Weight: 146 lb (66.2 kg) 156 lb 4.9 oz (70.9 kg)    Intake/Output from previous  day: 09/21 0701 - 09/22 0700 In: 1927.5 [P.O.:350; Blood:687.3; IV Piggyback:890.2] Out: 1600 [Urine:1600]  GENERAL:alert, no distress and comfortable SKIN: skin color, texture, turgor are normal, no rashes or significant lesions EYES: normal, Conjunctiva are pink and non-injected, sclera clear OROPHARYNX:no exudate, no erythema  and lips, buccal mucosa, and tongue normal  NECK: supple, thyroid normal size, non-tender, without nodularity LYMPH:  no palpable lymphadenopathy in the cervical, axillary or inguinal LUNGS: clear to auscultation and percussion with normal breathing effort HEART: regular rate & rhythm and no murmurs and no lower extremity edema ABDOMEN:abdomen soft, non-tender and normal bowel sounds Musculoskeletal:no cyanosis of digits and no clubbing  NEURO: alert & oriented x 3 with fluent speech, no focal motor/sensory deficits  LABORATORY DATA:  I have reviewed the data as listed CMP Latest Ref Rng & Units 08/17/2019 08/16/2019 08/16/2019  Glucose 70 - 99 mg/dL 101(H) 98 107(H)  BUN 8 - 23 mg/dL 32(H) 36(H) 39(H)  Creatinine 0.44 - 1.00 mg/dL 0.91 1.01(H) 0.93  Sodium 135 - 145 mmol/L 138 139 139  Potassium 3.5 - 5.1 mmol/L 3.4(L) 3.6 3.2(L)  Chloride 98 - 111 mmol/L 111 111 113(H)  CO2 22 - 32 mmol/L 20(L) 21(L) 20(L)  Calcium 8.9 - 10.3 mg/dL 7.9(L) 7.7(L) 7.4(L)  Total Protein 6.5 - 8.1 g/dL - - 4.6(L)  Total Bilirubin 0.3 - 1.2 mg/dL - - 0.5  Alkaline Phos 38 - 126 U/L - - 30(L)  AST 15 - 41 U/L - - 24  ALT 0 - 44 U/L - - 17    Lab Results  Component Value Date   WBC 6.4 08/17/2019   HGB 10.5 (L) 08/17/2019   HCT 33.0 (L) 08/17/2019   MCV 91.4 08/17/2019   PLT 128 (L) 08/17/2019   NEUTROABS 4.6 08/17/2019    Dg Chest Port 1 View  Result Date: 08/16/2019 CLINICAL DATA:  Dyspnea. History of blood transfusion, question volume overload EXAM: PORTABLE CHEST 1 VIEW COMPARISON:  Earlier the same day FINDINGS: Post treatment changes on the left including volume loss and increased density. There is radiation fibrosis and pleural fluid on a July 2020 CT. The right lung is clear. Normal heart size for technique. Coronary stenting. IMPRESSION: 1. No acute finding.  Stable compared to earlier today. 2. Post treatment changes in the left chest. The right lung is clear with no pulmonary edema.  Electronically Signed   By: Monte Fantasia M.D.   On: 08/16/2019 06:00   Dg Chest Portable 1 View  Result Date: 08/16/2019 CLINICAL DATA:  Increasing shortness of breath. History of lung cancer. EXAM: PORTABLE CHEST 1 VIEW COMPARISON:  Chest CT 06/08/2019 FINDINGS: Chronic volume loss in the left hemithorax with blunting of the costophrenic angle. Left apical pleuroparenchymal opacity which represent scarring on prior CT. Unchanged heart size and mediastinal contours allowing for differences in technique and modality. There is aortic atherosclerosis. The right lung is clear. No pulmonary edema. IMPRESSION: 1. Chronic volume loss in the left hemithorax with pleural thickening and scarring. 2. No acute abnormality demonstrated radiographically. 3.  Aortic Atherosclerosis (ICD10-I70.0). 4. Electronically Signed   By: Keith Rake M.D.   On: 08/16/2019 01:40    ASSESSMENT AND PLAN: 1.  Symptomatic anemia 2.  Metastatic breast cancer 3.  Mild thrombocytopenia 4.  Hypokalemia 5.  Elevated troponin likely due to demand ischemia 6.  Urinary tract infection  -Hemoglobin significantly improved following 5 units of packed red blood cells.  Monitor CBC closely and transfuse for hemoglobin less than  8 or active bleeding. -GI consult appreciated.  Capsule endoscopy recommended. -Continue exemestane.  Hold everolimus for now pending further GI work-up and treatment for UTI. -Thrombocytopenia is mild and likely due to chemotherapy.  Monitor.  No transfusion is indicated. -Replete potassium per hospitalist -Troponin slowly trending downward.  Monitor per hospitalist. -Antibiotics per hospitalist for urinary tract infection.   LOS: 0 days   Wendy Bussing, DNP, AGPCNP-BC, AOCNP 08/17/19  Attending Note  I personally saw the patient, reviewed the chart and examined the patient. The plan of care was discussed with the patient. I agree with the assessment and plan as documented above. Thank you very  much for the consultation. Severe anemia most likely due to bleeding.  Her baseline hemoglobin is possibly around 9-10.  The baseline anemia is due to anemia of chronic disease.  Acute exacerbation of anemia is likely due to bleeding.    Status post blood transfusion causing some dilutional thrombocytopenia. Okay to hold everolimus during hospital stay.  We can follow her as outpatient and monitor her labs. Thank you

## 2019-08-18 ENCOUNTER — Encounter (HOSPITAL_COMMUNITY)
Admission: EM | Disposition: A | Discharge status: Home / Self Care | Payer: Self-pay | Source: Home / Self Care | Attending: Internal Medicine

## 2019-08-18 HISTORY — PX: GIVENS CAPSULE STUDY: SHX5432

## 2019-08-18 LAB — BASIC METABOLIC PANEL
Anion gap: 9 (ref 5–15)
BUN: 26 mg/dL — ABNORMAL HIGH (ref 8–23)
CO2: 19 mmol/L — ABNORMAL LOW (ref 22–32)
Calcium: 7.7 mg/dL — ABNORMAL LOW (ref 8.9–10.3)
Chloride: 113 mmol/L — ABNORMAL HIGH (ref 98–111)
Creatinine, Ser: 0.91 mg/dL (ref 0.44–1.00)
GFR calc Af Amer: >60 mL/min (ref >60)
GFR calc non Af Amer: >60 mL/min (ref >60)
Glucose, Bld: 93 mg/dL (ref 70–99)
Potassium: 2.9 mmol/L — ABNORMAL LOW (ref 3.5–5.1)
Sodium: 141 mmol/L (ref 135–145)

## 2019-08-18 LAB — URINE CULTURE: Culture: 1000 — AB

## 2019-08-18 LAB — CBC
HCT: 28 % — ABNORMAL LOW (ref 36.0–46.0)
Hemoglobin: 9 g/dL — ABNORMAL LOW (ref 12.0–15.0)
MCH: 29.4 pg (ref 26.0–34.0)
MCHC: 32.1 g/dL (ref 30.0–36.0)
MCV: 91.5 fL (ref 80.0–100.0)
Platelets: 120 10*3/uL — ABNORMAL LOW (ref 150–400)
RBC: 3.06 MIL/uL — ABNORMAL LOW (ref 3.87–5.11)
RDW: 17.1 % — ABNORMAL HIGH (ref 11.5–15.5)
WBC: 4.1 10*3/uL (ref 4.0–10.5)
nRBC: 1 % — ABNORMAL HIGH (ref 0.0–0.2)

## 2019-08-18 SURGERY — IMAGING PROCEDURE, GI TRACT, INTRALUMINAL, VIA CAPSULE
Anesthesia: LOCAL

## 2019-08-18 MED ORDER — POTASSIUM CHLORIDE CRYS ER 20 MEQ PO TBCR
40.0000 meq | EXTENDED_RELEASE_TABLET | Freq: Once | ORAL | Status: AC
  Administered 2019-08-18: 40 meq via ORAL
  Filled 2019-08-18: qty 2

## 2019-08-18 SURGICAL SUPPLY — 1 items: TOWEL COTTON PACK 4EA (MISCELLANEOUS) ×4 IMPLANT

## 2019-08-18 NOTE — Progress Notes (Signed)
PROGRESS NOTE    Wendy [name]  RCV:[id number] DOB: 08/02/1941 DOA: 08/15/2019 PCP: [name], [name]    Brief Narrative:   78 year old female history of hypertension, hyperlipidemia, coronary artery disease, paroxysmal A. fib, PVD on chronic oral iron who presents to the ED with shortness of breath, dizziness which had worsened in the presyncopal episode.  Patient on admission noted to have a hemoglobin of 4.2 from 9.5 on 08/04/2019.  Patient states recently had colonoscopy and upper endoscopy per [name] in August 2020 and was noted to have benign polyps which were removed.  Patient states she was told upper endoscopy showed irritation of her esophagus which was felt to be secondary to reflux.  Patient states she was prescribed a two-week course of Pylera and due to GI side effects only took 3 days of it.  Patient transfused 3 units packed red blood cells hemoglobin currently at 7.5 from 4.2.  Cardiac enzymes cycled with a troponin going from 61-561.  Patient likely with a demand ischemia secondary to symptomatic anemia.  Patient denies any ongoing chest pain.  EKG with no signs of ischemia noted.  2D echo done and patient transfused an additional 2 units packed red blood cells.  Patient placed on IV PPI twice daily, bismuth, tetracycline, metronidazole.  GI consulted for further evaluation and management.  Trying to obtain records from patient's primary gastroenterologist, [name].   Assessment & Plan:   Principal Problem:   Symptomatic anemia Active Problems:   Essential hypertension   Hyperlipidemia   CKD (chronic kidney disease), stage III (HCC)   Hypotension due to hypovolemia   Acute blood loss anemia   Elevated troponin   Demand ischemia (HCC)   Gastroesophageal reflux disease   Metastatic breast cancer (HCC)   Hypokalemia  1. symptomatic anemia/acute blood loss anemia -Patient remained stable H&H stable after 5 units of PRBC transfusion on this admission.  -Hemoglobin improved to 10.5 >> 10.6 >> 9.0 today  -Denies of having any further bleeding, rectal bleeding.     patient presented with a hemoglobin down to 4.2, down from 9.5 on 08/04/2019.  Patient did state had a recent colonoscopy and upper endoscopy per her primary gastroenterologist, [name] in August 2020 noted to have some benign polyps which were removed and irritation in her esophagus felt secondary to reflux and prescribed a two-week course of Pylera which she only took 3 days of it due to GI side effects.    Concern for upper GI bleed.  Patient states was on oral iron and noted to have melanotic stools however discontinued iron tablets and stools were brown.    Continue Protonix 40 mg IV every 12 hours, metronidazole, tetracycline, bismuth.  Continue clear liquids for now.  Try to obtain records of recent upper endoscopy, pathology report, colonoscopy from primary gastroenterologist office since 08/16/2019.   GI following closely. -Capsule endoscopy initiated today.    Elevated troponin/probable demand ischemia -Mild shortness of breath, presyncope, mildly elevated troponin likely due to ischemic demand, anemia as above. -Improved.  Denies any chest pain. -No change in EKG.  Cardiac enzymes cycled with high-sensitivity troponin going from 61-> 561>> 653>>> 626.   2D echo:  was obtained with a EF of 30 to 65%, mildly increased left ventricular posterior wall thickness with no LVH, impaired relaxation pattern of LV diastolic filling, global right ventricle with normal systolic function, pericardial effusion is circumferential, mild tricuspid valvular regurgitation, no wall motion abnormalities.    No further cardiac work-up  needed at this time.  3.  Gastroesophageal reflux disease PPI.  4.  UTI Urine cultures no growth to date..  Continue IV Rocephin x3 doses, ending today 08/18/2019.  5.  1/4 blood cultures with coagulase-negative staph Likely a contaminant.  Patient  afebrile.  Patient on IV Rocephin for UTI -- will D/C   -If leukocytosis or febrile repeating blood cultures otherwise would holding antibiotics for now.  6.  Hypotension Likely secondary to problem #1.  Improved with transfusion of packed red blood cells and supportive care.  Follow.  7.  Hypertension Stable  Continue to hold antihypertensive medications as patient had presented with hypotension.  Follow.  8.  Hyperlipidemia/coronary artery disease/peripheral vascular disease Stable  Discontinued secondary to problem #1.  Continue statin.  9.  History of metastatic breast cancer Patient continued on Aromasin. ..hold everolimus during hospital stay  Oncology consulted, following closely appreciate   10.  Hypokalemia -Monitoring, low due to GI prep -Continue oral supplementation, with extra dose today.    DVT prophylaxis: SCDs Code Status: DNR Family Communication: Updated patient.  No family at bedside. Disposition Plan: Remain in stepdown unit today.   Consultants:   Gastroenterology: Dr. [name] III 08/16/2019  Procedures:  2D echo 08/16/2019  CXR 08/16/2019  Transfused 5 units packed red blood cells 08/16/2019  Antimicrobials:  IV Rocephin 08/16/2019   Subjective:  The patient was seen and examined this morning, stable in no acute distress.  Status post overnight GI prep. Has successfully swallow the camera for small GI evaluation this morning.    Objective: Vitals:   08/18/19 0900 08/18/19 0938 08/18/19 1000 08/18/19 1100  BP: (!) 112/44  (!) 137/52 (!) 111/42  Pulse: 81  87 81  Resp: 14  17 16   Temp:      TempSrc:      SpO2: 98%  99% 99%  Weight:  69.9 kg    Height:  5\' 8"  (1.727 m)      Intake/Output Summary (Last 24 hours) at 08/18/2019 1150 Last data filed at 08/18/2019 0653 Gross per 24 hour  Intake 438.64 ml  Output 200 ml  Net 238.64 ml   Filed Weights   08/17/19 0451 08/18/19 0500 08/18/19 0938  Weight: 70.9 kg 69.9 kg 69.9 kg     Examination: BP (!) 111/42   Pulse 81   Temp (!) 97.5 F (36.4 C) (Oral)   Resp 16   Ht 5\' 8"  (1.727 m)   Wt 69.9 kg   SpO2 99%   BMI 23.43 kg/m    Physical Exam  Constitution:  Alert, cooperative, no distress,  Psychiatric: Normal and stable mood and affect, cognition intact,   HEENT: Normocephalic, PERRL, otherwise with in Normal limits  Chest:Chest symmetric Cardio vascular:  S1/S2, RRR, No murmure, No Rubs or Gallops  pulmonary: Clear to auscultation bilaterally, respirations unlabored, negative wheezes / crackles Abdomen: Soft, non-tender, non-distended, bowel sounds,no masses, no organomegaly Muscular skeletal: Limited exam - in bed, able to move all 4 extremities, Normal strength,  Neuro: CNII-XII intact. , normal motor and sensation, reflexes intact  Extremities: No pitting edema lower extremities, +2 pulses  Skin: Dry, warm to touch, negative for any Rashes, No open wounds Wounds: per nursing documentation      Data Reviewed: I have personally reviewed following labs and imaging studies  CBC: Recent Labs  Lab 08/16/19 0039 08/16/19 1116 08/16/19 2256 08/17/19 0918 08/17/19 1508 08/18/19 0555  WBC 9.1 5.2 5.6 6.4  --  4.1  NEUTROABS 6.4  --  3.6 4.6  --   --   HGB 4.2* 7.5* 10.2* 10.5* 10.6* 9.0*  HCT 14.2* 23.8* 30.9* 33.0* 32.7* 28.0*  MCV 88.2 88.5 88.5 91.4  --  91.5  PLT 230 119* 113* 128*  --  076*   Basic Metabolic Panel: Recent Labs  Lab 08/16/19 0039 08/16/19 1116 08/16/19 2104 08/17/19 0918 08/18/19 0555  NA 142 139 139 138 141  K 3.4* 3.2* 3.6 3.4* 2.9*  CL 107 113* 111 111 113*  CO2 23 20* 21* 20* 19*  GLUCOSE 138* 107* 98 101* 93  BUN 42* 39* 36* 32* 26*  CREATININE 1.13* 0.93 1.01* 0.91 0.91  CALCIUM 8.7* 7.4* 7.7* 7.9* 7.7*   GFR: Estimated Creatinine Clearance: 51.4 mL/min (by C-G formula based on SCr of 0.91 mg/dL). Liver Function Tests: Recent Labs  Lab 08/16/19 0039 08/16/19 1116  AST 27 24  ALT 18 17  ALKPHOS 37*  30*  BILITOT 0.2* 0.5  PROT 5.4* 4.6*  ALBUMIN 3.0* 2.5*   No results for input(s): LIPASE, AMYLASE in the last 168 hours. No results for input(s): AMMONIA in the last 168 hours. Coagulation Profile: Recent Labs  Lab 08/16/19 0039  INR 1.0   Sepsis Labs: Recent Labs  Lab 08/16/19 0041  LATICACIDVEN 1.9    Recent Results (from the past 240 hour(s))  Blood culture (routine x 2)     Status: None (Preliminary result)   Collection Time: 08/16/19 12:40 AM   Specimen: BLOOD RIGHT FOREARM  Result Value Ref Range Status   Specimen Description BLOOD RIGHT FOREARM  Final   Special Requests   Final    BOTTLES DRAWN AEROBIC AND ANAEROBIC Blood Culture adequate volume   Culture   Final    NO GROWTH 2 DAYS Performed at [facility], [address] [place], [place] [address]    Report Status PENDING  Incomplete  Urine culture     Status: Abnormal   Collection Time: 08/16/19 12:41 AM   Specimen: In/Out Cath Urine  Result Value Ref Range Status   Specimen Description   Final    IN/OUT CATH URINE Performed at [facility] [address], [place], [place] [address]    Special Requests   Final    NONE Performed at [facility], [place] [address], [place], [place] [address]    Culture 1,000 COLONIES/mL PROTEUS MIRABILIS (A)  Final   Report Status 08/18/2019 FINAL  Final   Organism ID, Bacteria PROTEUS MIRABILIS (A)  Final      Susceptibility   Proteus mirabilis - MIC*    AMPICILLIN <=2 SENSITIVE Sensitive     CEFAZOLIN <=4 SENSITIVE Sensitive     CEFTRIAXONE <=1 SENSITIVE Sensitive     CIPROFLOXACIN <=0.25 SENSITIVE Sensitive     GENTAMICIN <=1 SENSITIVE Sensitive     IMIPENEM 4 SENSITIVE Sensitive     NITROFURANTOIN 128 RESISTANT Resistant     TRIMETH/SULFA <=20 SENSITIVE Sensitive     AMPICILLIN/SULBACTAM <=2 SENSITIVE Sensitive     PIP/TAZO <=4 SENSITIVE Sensitive     * 1,000 COLONIES/mL PROTEUS MIRABILIS  SARS Coronavirus 2  [facility] order, Performed in [facility] hospital lab) Nasopharyngeal Nasopharyngeal Swab     Status: None   Collection Time: 08/16/19 12:43 AM   Specimen: Nasopharyngeal Swab  Result Value Ref Range Status   SARS Coronavirus 2 NEGATIVE NEGATIVE Final    Comment: (NOTE) If result is NEGATIVE SARS-CoV-2 target nucleic acids are NOT DETECTED. The SARS-CoV-2 RNA is  generally detectable in upper and lower  respiratory specimens during the acute phase of infection. The lowest  concentration of SARS-CoV-2 viral copies this assay can detect is 250  copies / mL. A negative result does not preclude SARS-CoV-2 infection  and should not be used as the sole basis for treatment or other  patient management decisions.  A negative result may occur with  improper specimen collection / handling, submission of specimen other  than nasopharyngeal swab, presence of viral mutation(s) within the  areas targeted by this assay, and inadequate number of viral copies  (<250 copies / mL). A negative result must be combined with clinical  observations, patient history, and epidemiological information. If result is POSITIVE SARS-CoV-2 target nucleic acids are DETECTED. The SARS-CoV-2 RNA is generally detectable in upper and lower  respiratory specimens dur ing the acute phase of infection.  Positive  results are indicative of active infection with SARS-CoV-2.  Clinical  correlation with patient history and other diagnostic information is  necessary to determine patient infection status.  Positive results do  not rule out bacterial infection or co-infection with other viruses. If result is PRESUMPTIVE POSTIVE SARS-CoV-2 nucleic acids MAY BE PRESENT.   A presumptive positive result was obtained on the submitted specimen  and confirmed on repeat testing.  While 2019 novel coronavirus  (SARS-CoV-2) nucleic acids may be present in the submitted sample  additional confirmatory testing may be necessary for  epidemiological  and / or clinical management purposes  to differentiate between  SARS-CoV-2 and other Sarbecovirus currently known to infect humans.  If clinically indicated additional testing with an alternate test  methodology [phone]) is advised. The SARS-CoV-2 RNA is generally  detectable in upper and lower respiratory sp ecimens during the acute  phase of infection. The expected result is Negative. Fact Sheet for Patients:  StrictlyIdeas.no Fact Sheet for Healthcare Providers: BankingDealers.co.za This test is not yet approved or cleared by the [place] FDA and has been authorized for detection and/or diagnosis of SARS-CoV-2 by FDA under an Emergency Use Authorization (EUA).  This EUA will remain in effect (meaning this test can be used) for the duration of the COVID-19 declaration under Section 564(b)(1) of the Act, 21 U.S.C. section 360bbb-3(b)(1), unless the authorization is terminated or revoked sooner. Performed at [facility], [place] [address] [place], [place] [address]   Blood culture (routine x 2)     Status: Abnormal (Preliminary result)   Collection Time: 08/16/19 12:45 AM   Specimen: BLOOD  Result Value Ref Range Status   Specimen Description BLOOD RIGHT ANTECUBITAL  Final   Special Requests   Final    BOTTLES DRAWN AEROBIC AND ANAEROBIC Blood Culture adequate volume   Culture  Setup Time   Final    GRAM POSITIVE COCCI ANAEROBIC BOTTLE ONLY CRITICAL RESULT CALLED TO, READ BACK BY AND VERIFIED WITH: PHRMD J GRIMFLEY @0603  08/17/19 BY S GEZAHEGN Performed at [facility], [place] [address], [place], [place] [address]    Culture STAPHYLOCOCCUS SPECIES (COAGULASE NEGATIVE) (A)  Final   Report Status PENDING  Incomplete  Blood Culture ID Panel (Reflexed)     Status: Abnormal   Collection Time: 08/16/19 12:45 AM  Result Value Ref Range Status   Enterococcus species NOT DETECTED NOT DETECTED  Final   Listeria monocytogenes NOT DETECTED NOT DETECTED Final   Staphylococcus species DETECTED (A) NOT DETECTED Final    Comment: Methicillin (oxacillin) susceptible coagulase negative staphylococcus. Possible blood culture contaminant (unless isolated  from more than one blood culture draw or clinical case suggests pathogenicity). No antibiotic treatment is indicated for blood  culture contaminants. CRITICAL RESULT CALLED TO, READ BACK BY AND VERIFIED WITH: PHRMD J GRIMFLEY @0603  08/17/19 BY S GEZAHEGN    Staphylococcus aureus (BCID) NOT DETECTED NOT DETECTED Final   Methicillin resistance NOT DETECTED NOT DETECTED Final   Streptococcus species NOT DETECTED NOT DETECTED Final   Streptococcus agalactiae NOT DETECTED NOT DETECTED Final   Streptococcus pneumoniae NOT DETECTED NOT DETECTED Final   Streptococcus pyogenes NOT DETECTED NOT DETECTED Final   Acinetobacter baumannii NOT DETECTED NOT DETECTED Final   Enterobacteriaceae species NOT DETECTED NOT DETECTED Final   Enterobacter cloacae complex NOT DETECTED NOT DETECTED Final   Escherichia coli NOT DETECTED NOT DETECTED Final   Klebsiella oxytoca NOT DETECTED NOT DETECTED Final   Klebsiella pneumoniae NOT DETECTED NOT DETECTED Final   Proteus species NOT DETECTED NOT DETECTED Final   Serratia marcescens NOT DETECTED NOT DETECTED Final   Haemophilus influenzae NOT DETECTED NOT DETECTED Final   Neisseria meningitidis NOT DETECTED NOT DETECTED Final   Pseudomonas aeruginosa NOT DETECTED NOT DETECTED Final   Candida albicans NOT DETECTED NOT DETECTED Final   Candida glabrata NOT DETECTED NOT DETECTED Final   Candida krusei NOT DETECTED NOT DETECTED Final   Candida parapsilosis NOT DETECTED NOT DETECTED Final   Candida tropicalis NOT DETECTED NOT DETECTED Final    Comment: Performed at [place] [facility], [place] [place], [place] [address]  MRSA PCR Screening     Status: None   Collection Time: 08/16/19 10:44 AM   Specimen:  Nasopharyngeal  Result Value Ref Range Status   MRSA by PCR NEGATIVE NEGATIVE Final    Comment:        The GeneXpert MRSA Assay (FDA approved for NASAL specimens only), is one component of a comprehensive MRSA colonization surveillance program. It is not intended to diagnose MRSA infection nor to guide or monitor treatment for MRSA infections. Performed at [facility], [place] [address] [place], [place] [address]          Radiology Studies: No results found.      Scheduled Meds: . atorvastatin  20 mg Oral QHS  . bismuth subsalicylate  098 mg Oral QID  . Chlorhexidine Gluconate Cloth  6 each Topical Daily  . exemestane  25 mg Oral QPC breakfast  . fluticasone  2 spray Each Nare Daily  . loratadine  10 mg Oral Daily  . metroNIDAZOLE  500 mg Oral Q12H  . pantoprazole (PROTONIX) IV  40 mg Intravenous Q12H  . potassium chloride SA  20 mEq Oral Daily  . tetracycline  500 mg Oral QID   Continuous Infusions: . cefTRIAXone (ROCEPHIN)  IV Stopped (08/18/19 0648)     LOS: 1 day    Time spent: 40 minutes    Deatra James, MD [facility]  If 7PM-7AM, please contact night-coverage [email or web address] 08/18/2019, 11:50 AM

## 2019-08-19 ENCOUNTER — Encounter (HOSPITAL_COMMUNITY): Payer: Self-pay | Admitting: Gastroenterology

## 2019-08-19 DIAGNOSIS — N183 Chronic kidney disease, stage 3 (moderate): Secondary | ICD-10-CM

## 2019-08-19 DIAGNOSIS — D62 Acute posthemorrhagic anemia: Secondary | ICD-10-CM

## 2019-08-19 DIAGNOSIS — K921 Melena: Secondary | ICD-10-CM

## 2019-08-19 LAB — CBC
HCT: 25.3 % — ABNORMAL LOW (ref 36.0–46.0)
HCT: 26.3 % — ABNORMAL LOW (ref 36.0–46.0)
HCT: 25.6 % — ABNORMAL LOW (ref 36.0–46.0)
Hemoglobin: 8 g/dL — ABNORMAL LOW (ref 12.0–15.0)
Hemoglobin: 8.2 g/dL — ABNORMAL LOW (ref 12.0–15.0)
Hemoglobin: 7.9 g/dL — ABNORMAL LOW (ref 12.0–15.0)
MCH: 29.5 pg (ref 26.0–34.0)
MCH: 29.3 pg (ref 26.0–34.0)
MCH: 29.2 pg (ref 26.0–34.0)
MCHC: 31.6 g/dL (ref 30.0–36.0)
MCHC: 31.2 g/dL (ref 30.0–36.0)
MCHC: 30.9 g/dL (ref 30.0–36.0)
MCV: 93.4 fL (ref 80.0–100.0)
MCV: 93.9 fL (ref 80.0–100.0)
MCV: 94.5 fL (ref 80.0–100.0)
Platelets: 126 10*3/uL — ABNORMAL LOW (ref 150–400)
Platelets: 138 10*3/uL — ABNORMAL LOW (ref 150–400)
Platelets: 126 10*3/uL — ABNORMAL LOW (ref 150–400)
RBC: 2.71 MIL/uL — ABNORMAL LOW (ref 3.87–5.11)
RBC: 2.8 MIL/uL — ABNORMAL LOW (ref 3.87–5.11)
RBC: 2.71 MIL/uL — ABNORMAL LOW (ref 3.87–5.11)
RDW: 17.6 % — ABNORMAL HIGH (ref 11.5–15.5)
RDW: 17.6 % — ABNORMAL HIGH (ref 11.5–15.5)
RDW: 17.8 % — ABNORMAL HIGH (ref 11.5–15.5)
WBC: 3.3 10*3/uL — ABNORMAL LOW (ref 4.0–10.5)
WBC: 3.8 10*3/uL — ABNORMAL LOW (ref 4.0–10.5)
WBC: 3.5 10*3/uL — ABNORMAL LOW (ref 4.0–10.5)
nRBC: 0.6 % — ABNORMAL HIGH (ref 0.0–0.2)
nRBC: 0.5 % — ABNORMAL HIGH (ref 0.0–0.2)
nRBC: 0.6 % — ABNORMAL HIGH (ref 0.0–0.2)

## 2019-08-19 LAB — BASIC METABOLIC PANEL
Anion gap: 8 (ref 5–15)
BUN: 18 mg/dL (ref 8–23)
CO2: 19 mmol/L — ABNORMAL LOW (ref 22–32)
Calcium: 7.4 mg/dL — ABNORMAL LOW (ref 8.9–10.3)
Chloride: 112 mmol/L — ABNORMAL HIGH (ref 98–111)
Creatinine, Ser: 0.88 mg/dL (ref 0.44–1.00)
GFR calc Af Amer: >60 mL/min (ref >60)
GFR calc non Af Amer: >60 mL/min (ref >60)
Glucose, Bld: 87 mg/dL (ref 70–99)
Potassium: 3.5 mmol/L (ref 3.5–5.1)
Sodium: 139 mmol/L (ref 135–145)

## 2019-08-19 LAB — PROTIME-INR
INR: 1.1 (ref 0.8–1.2)
Prothrombin Time: 13.7 seconds (ref 11.4–15.2)

## 2019-08-19 LAB — CULTURE, BLOOD (ROUTINE X 2): Special Requests: ADEQUATE

## 2019-08-19 LAB — MAGNESIUM: Magnesium: 1.8 mg/dL (ref 1.7–2.4)

## 2019-08-19 MED ORDER — METRONIDAZOLE 500 MG PO TABS
500.0000 mg | ORAL_TABLET | Freq: Three times a day (TID) | ORAL | Status: DC
  Administered 2019-08-19 – 2019-08-22 (×7): 500 mg via ORAL
  Filled 2019-08-19 (×7): qty 1

## 2019-08-19 MED ORDER — SODIUM CHLORIDE 0.9 % IV SOLN
8.0000 mg/h | INTRAVENOUS | Status: DC
  Administered 2019-08-19 – 2019-08-20 (×2): 8 mg/h via INTRAVENOUS
  Filled 2019-08-19 (×3): qty 80

## 2019-08-19 NOTE — Procedures (Signed)
Procedure Note: Video Capsule Endoscopy completed. Please see separate procedure note to be scanned into Epic for full details, but briefly:  Findings: - Complete study with adequate bowel preparation - Mild gastritis - Active, brisk bleeding in the gastric antrum, with possible underlying AVMs vs GAVE vs unseen ulcer due to large volume bleeding - Blood clots and blood noted throughout small bowel, without areas of additional bleeding - Possible small AVMs in mid and distal small bowel, but favor these images to be more likely old blood in the lumen  Recommendations: - Urgent inpatient EGD for diagnostic and therapeutic intent - Trend serial H/H with additional pRBC transfusions per protocol prn - Resume PPI - NPO - Additional recommendations pending EGD

## 2019-08-19 NOTE — Evaluation (Signed)
Physical Therapy Evaluation Patient Details Name: Wendy [name] MRN: [id number] DOB: 28-Apr-1941 Today's Date: 08/19/2019   History of Present Illness  78 year old female history of hypertension, hyperlipidemia, coronary artery disease, paroxysmal A. fib, PVD on chronic oral iron who presents to the ED with shortness of breath, dizziness which had worsened in the presyncopal episode.  Patient on admission noted to have a hemoglobin of 4.2 from 9.5 on 9/9/2020Cardiac enzymes cycled with a troponin going from 61-561.  Patient likely with a demand ischemia secondary to symptomatic anemia.  Clinical Impression  Pt admitted with above diagnosis.  Pt is independent at her baseline, doubt she will need f/u post acute but will continue to follow in acute setting; Pt with slow, guarded gait and fatigues easily with Hgb of 8.0 today.  SpO2=96% on RA, denies dizziness.   Pt currently with functional limitations due to the deficits listed below (see PT Problem List). Pt will benefit from skilled PT to increase their independence and safety with mobility to allow discharge to the venue listed below.       Follow Up Recommendations No PT follow up    Equipment Recommendations  None recommended by PT    Recommendations for Other Services       Precautions / Restrictions        Mobility  Bed Mobility Overal bed mobility: Modified Independent                Transfers Overall transfer level: Needs assistance Equipment used: None Transfers: Sit to/from Stand Sit to Stand: Min guard         General transfer comment: from varied, ht surfaces, min/guard for safety  Ambulation/Gait Ambulation/Gait assistance: Min guard;Min assist Gait Distance (Feet): 200 Feet Assistive device: IV Pole;None Gait Pattern/deviations: Step-through pattern;Decreased stride length;Narrow base of support Gait velocity: decr   General Gait Details: pt with mildly unsteady gait but no overt LOB. extremely  fatigued with decr gait velocity last 50' of distance. reliant on IV pole for support. cues for posture, breathing. denies dizziness (Hgb 8.0 today) SpO2= 96% on RA, HR 85-100  Stairs            Wheelchair Mobility    Modified Rankin (Stroke Patients Only)       Balance Overall balance assessment: Needs assistance Sitting-balance support: Feet supported;No upper extremity supported Sitting balance-Leahy Scale: Good     Standing balance support: Single extremity supported;During functional activity Standing balance-Leahy Scale: Fair Standing balance comment: reliant on single  UE support for dynamic tasks,                             Pertinent Vitals/Pain Pain Assessment: No/denies pain    Home Living Family/patient expects to be discharged to:: Private residence     Type of Home: House Home Access: Stairs to enter Entrance Stairs-Rails: None Entrance Stairs-Number of Steps: 1 Home Layout: One level Home Equipment: Cane - single point;Walker - 2 wheels      Prior Function Level of Independence: Independent               Hand Dominance        Extremity/Trunk Assessment   Upper Extremity Assessment Upper Extremity Assessment: Overall WFL for tasks assessed    Lower Extremity Assessment Lower Extremity Assessment: Generalized weakness       Communication   Communication: No difficulties  Cognition Arousal/Alertness: Awake/alert Behavior During Therapy: WFL for tasks assessed/performed Overall Cognitive  Status: Within Functional Limits for tasks assessed                                        General Comments      Exercises     Assessment/Plan    PT Assessment Patient needs continued PT services  PT Problem List Decreased strength;Decreased activity tolerance;Decreased balance;Decreased mobility       PT Treatment Interventions DME instruction;Gait training;Functional mobility training;Therapeutic  exercise;Patient/family education;Therapeutic activities;Balance training    PT Goals (Current goals can be found in the Care Plan section)  Acute Rehab PT Goals PT Goal Formulation: With patient Time For Goal Achievement: 08/26/19 Potential to Achieve Goals: Good    Frequency Min 3X/week   Barriers to discharge        Co-evaluation               AM-PAC PT "6 Clicks" Mobility  Outcome Measure Help needed turning from your back to your side while in a flat bed without using bedrails?: None Help needed moving from lying on your back to sitting on the side of a flat bed without using bedrails?: None Help needed moving to and from a bed to a chair (including a wheelchair)?: A Little Help needed standing up from a chair using your arms (e.g., wheelchair or bedside chair)?: A Little Help needed to walk in hospital room?: A Little Help needed climbing 3-5 steps with a railing? : A Little 6 Click Score: 20    End of Session Equipment Utilized During Treatment: Gait belt Activity Tolerance: Patient tolerated treatment well;Patient limited by fatigue Patient left: in bed;with call bell/phone within reach;with bed alarm set   PT Visit Diagnosis: Difficulty in walking, not elsewhere classified (R26.2)    Time: 1657-9038 PT Time Calculation (min) (ACUTE ONLY): 23 min   Charges:   PT Evaluation $PT Eval Low Complexity: 1 Low PT Treatments $Gait Training: 8-22 mins        Kenyon Ana, PT  Pager: [phone] Acute Rehab Dept [facility]): [phone]   08/19/2019   [facility] 08/19/2019, 5:00 PM

## 2019-08-19 NOTE — Progress Notes (Signed)
[facility] Progress Note  Chief Complaint: Chronic blood loss anemia  History:  Wendy [name] had black stool with a bowel preparation, tapering off toward the end.  She still had some "flecks" of black material in her stool earlier today.  She has not had hematemesis.  Blood pressure and heart rate have remained stable.  Hemoglobin has continued to decrease, getting down to 8.0 this morning. Video capsule study showed active bleeding in the distal stomach with blood then having passed throughout much of the small bowel, obscuring small bowel visualization.  ROS: Cardiovascular: Denies chest pain Respiratory: Denies dyspnea Urinary: Denies dysuria Fatigue with exertion.  She was working with physical therapy when I first arrived on the floor. Objective:   Current Facility-Administered Medications:  .  acetaminophen (TYLENOL) tablet 650 mg, 650 mg, Oral, Q6H PRN, 650 mg at 08/18/19 1801 **OR** acetaminophen (TYLENOL) suppository 650 mg, 650 mg, Rectal, Q6H PRN, Jani Gravel, MD .  atorvastatin (LIPITOR) tablet 20 mg, 20 mg, Oral, QHS, Eugenie Filler, MD, 20 mg at 08/18/19 2221 .  bismuth subsalicylate (PEPTO BISMOL) chewable tablet 524 mg, 524 mg, Oral, QID, Eugenie Filler, MD, 524 mg at 08/19/19 1520 .  Chlorhexidine Gluconate Cloth 2 % PADS 6 each, 6 each, Topical, Daily, Eugenie Filler, MD, 6 each at 08/18/19 1322 .  exemestane (AROMASIN) tablet 25 mg, 25 mg, Oral, QPC breakfast, Eugenie Filler, MD, 25 mg at 08/19/19 1022 .  fluticasone (FLONASE) 50 MCG/ACT nasal spray 2 spray, 2 spray, Each Nare, Daily, Eugenie Filler, MD .  loratadine (CLARITIN) tablet 10 mg, 10 mg, Oral, Daily, Eugenie Filler, MD, 10 mg at 08/19/19 1023 .  metroNIDAZOLE (FLAGYL) tablet 500 mg, 500 mg, Oral, Q12H, Eugenie Filler, MD, 500 mg at 08/19/19 1023 .  ondansetron (ZOFRAN) injection 4 mg, 4 mg, Intravenous, Q6H PRN, Jani Gravel, MD, 4 mg at 08/19/19 1226 .  pantoprazole (PROTONIX) 80 mg in  sodium chloride 0.9 % 250 mL (0.32 mg/mL) infusion, 8 mg/hr, Intravenous, Continuous, Levin Erp, [place], Last Rate: 25 mL/hr at 08/19/19 1514, 8 mg/hr at 08/19/19 1514 .  potassium chloride SA (K-DUR) CR tablet 20 mEq, 20 mEq, Oral, Daily, Eugenie Filler, MD, 20 mEq at 08/19/19 1023 .  tetracycline (SUMYCIN) capsule 500 mg, 500 mg, Oral, QID, Shahmehdi, Seyed A, MD, 500 mg at 08/19/19 1023  . pantoprozole (PROTONIX) infusion 8 mg/hr (08/19/19 1514)     Vital signs in last 24 hrs: Vitals:   08/19/19 0531 08/19/19 1221  BP: 111/61 (!) 135/58  Pulse: 87 97  Resp: 18 16  Temp: 98.2 F (36.8 C) 98.4 F (36.9 C)  SpO2: 98% 100%    Intake/Output Summary (Last 24 hours) at 08/19/2019 1617 Last data filed at 08/19/2019 0926 Gross per 24 hour  Intake 340 ml  Output -  Net 340 ml     Physical Exam   HEENT: sclera anicteric, oral mucosa without lesions  Neck: supple, no thyromegaly, JVD or lymphadenopathy  Cardiac: RRR without murmurs, S1S2 heard, no peripheral edema  Pulm: clear to auscultation bilaterally, normal RR and effort noted  Abdomen: soft, no tenderness, with active bowel sounds. No guarding or palpable hepatosplenomegaly  Skin; warm and dry, no jaundice, + pale  Recent Labs:  CBC Latest Ref Rng & Units 08/19/2019 08/19/2019 08/18/2019  WBC 4.0 - 10.5 K/uL 3.8(L) 3.3(L) 4.1  Hemoglobin 12.0 - 15.0 g/dL 8.2(L) 8.0(L) 9.0(L)  Hematocrit 36.0 - 46.0 % 26.3(L) 25.3(L) 28.0(L)  Platelets  150 - 400 K/uL 138(L) 126(L) 120(L)    Recent Labs  Lab 08/19/19 1420  INR 1.1   CMP Latest Ref Rng & Units 08/19/2019 08/18/2019 08/17/2019  Glucose 70 - 99 mg/dL 87 93 101(H)  BUN 8 - 23 mg/dL 18 26(H) 32(H)  Creatinine 0.44 - 1.00 mg/dL 0.88 0.91 0.91  Sodium 135 - 145 mmol/L 139 141 138  Potassium 3.5 - 5.1 mmol/L 3.5 2.9(L) 3.4(L)  Chloride 98 - 111 mmol/L 112(H) 113(H) 111  CO2 22 - 32 mmol/L 19(L) 19(L) 20(L)  Calcium 8.9 - 10.3 mg/dL 7.4(L) 7.7(L) 7.9(L)  Total  Protein 6.5 - 8.1 g/dL - - -  Total Bilirubin 0.3 - 1.2 mg/dL - - -  Alkaline Phos 38 - 126 U/L - - -  AST 15 - 41 U/L - - -  ALT 0 - 44 U/L - - -   Video capsule study images were reviewed and discussed with Dr. Bryan Lemma, who read the study earlier today.  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  The culprit lesion is obscured by the overlying fresh blood.   She needs an upper endoscopy tomorrow, and is agreeable after thorough discussion of procedure and risks.  The benefits and risks of the planned procedure were described in detail with the patient or (when appropriate) their health care proxy.  Risks were outlined as including, but not limited to, bleeding, infection, perforation, adverse medication reaction leading to cardiac or pulmonary decompensation.  The limitation of incomplete mucosal visualization was also discussed.  No guarantees or warranties were given.  Patient at increased risk for cardiopulmonary complications of procedure due to medical comorbidities.  She is familiar with the procedure, having had it done early last month by [name].  In the meantime, serial hemoglobin and hematocrit, and I am encouraged that the afternoon value was stable from this morning.  Her INR remains normal.  Total time 35 minutes  Wendy [name] Office: [phone]

## 2019-08-19 NOTE — Progress Notes (Signed)
[facility]                                                                              Patient Demographics  Wendy [name], is a 78 y.o. female, DOB - 1941/11/19, ION:[id number]  Admit date - 08/15/2019   Admitting Physician Jani Gravel, MD  Outpatient Primary MD for the patient is Delorse Limber  Outpatient specialists:   LOS - 2  days   Medical records reviewed and are as summarized below:    Chief Complaint  Patient presents with  . Shortness of Breath       Brief summary   78 year old female history of hypertension, hyperlipidemia, coronary artery disease, paroxysmal A. fib, PVD on chronic oral iron who presents to the ED with shortness of breath, dizziness which had worsened in the presyncopal episode. Patient on admission noted to have a hemoglobin of 4.2 from 9.5 on 08/04/2019. Patient states recently had colonoscopy and upper endoscopy per [name] in August 2020 and was noted to have benign polyps which were removed. Patient states she was told upper endoscopy showed irritation of her esophagus which was felt to be secondary to reflux. Patient states she was prescribed a two-week course of Pylera and due to GI side effects only took 3 days of it. Patient transfused 3 units packed red blood cells hemoglobin currently at 7.5 from 4.2. Cardiac enzymes cycled with a troponin going from 61-561. Patient likely with a demand ischemia secondary to symptomatic anemia. Patient denies any ongoing chest pain. EKG with no signs of ischemia noted. 2D echo done and patient transfused an additional 2 units packed red blood cells.  Patient placed on IV PPI twice daily, bismuth, tetracycline, metronidazole.  GI consulted for further evaluation and management.  Trying to obtain records from patient's primary gastroenterologist, [name].    Assessment & Plan    Principal Problem:   Symptomatic anemia, acute blood loss anemia -Presented with hemoglobin  of 4.2, down from 9.5 on 9/9.  Received 5 units of packed RBC during this hospitalization -Had recent colonoscopy and upper endoscopy per her primary gastroenterologist, [name] in August 2020 noted to have some benign polyps which were removed and irritation in her esophagus felt secondary to reflux and prescribed a two-week course of Pylera which she only took 3 days of it due to GI side effects -Per patient still having some melanotic stools -Hemoglobin today 8.2 -Underwent capsule endoscopy, showed mild gastritis, brisk bleeding in the gastric antrum with possible underlying AVM versus GAVE vs unseen also due to large volume bleeding.  Blood clots and blood noted throughout the small bowel, possible small AVMs in the mid and distal small bowel recommended urgent inpatient diagnostic and therapeutic EGD  -N.p.o. PPI resumed -Serial H&H   Elevated troponin -Likely due to demand ischemia, acute blood loss anemia -Currently no cardiac symptoms, no change in the EKG. -2D echo showed EF of 60 to 65%, mildly increased left ventricular posterior wall thickness.  Impaired relaxation.   - No further cardiac work-up needed at this time.  GERD -Continue PPI.  UTI Urine culture showed no growth, patient was  placed on IV Rocephin, received 3 doses, discontinued on 9/23  1/4 blood cultures coagulase-negative staph -Likely contaminant  Hypotension -Likely due to #1, continue supportive care with packed RBC transfusion, IV fluids as needed -BP improved, continue to hold antihypertensives for now  Hyperlipidemia Continue statin  History of metastatic breast cancer Oncology was consulted, no current acute issues, outpatient follow-up  Code Status: DNR DVT Prophylaxis: SCDs Family Communication: Discussed all imaging results, lab results, explained to the patient   Disposition Plan: Still having melanotic stools, needs urgent EGD, remains inpatient  Time Spent in minutes 45 minutes   Procedures:  Video endoscopy Findings: - Complete study with adequate bowel preparation - Mild gastritis - Active, brisk bleeding in the gastric antrum, with possible underlying AVMs vs GAVE vs unseen ulcer due to large volume bleeding - Blood clots and blood noted throughout small bowel, without areas of additional bleeding - Possible small AVMs in mid and distal small bowel, but favor these images to be more likely old blood in the lumen  Consultants:   Gastroenterology  Antimicrobials:   Anti-infectives (From admission, onward)   Start     Dose/Rate Route Frequency Ordered Stop   08/17/19 0615  cefTRIAXone (ROCEPHIN) 2 g in sodium chloride 0.9 % 100 mL IVPB  Status:  Discontinued     2 g 200 mL/hr over 30 Minutes Intravenous Daily 08/17/19 0614 08/18/19 1408   08/16/19 2200  ceFEPIme (MAXIPIME) 2 g in sodium chloride 0.9 % 100 mL IVPB  Status:  Discontinued     2 g 200 mL/hr over 30 Minutes Intravenous Every 24 hours 08/16/19 0547 08/16/19 1655   08/16/19 1830  cefTRIAXone (ROCEPHIN) 1 g in sodium chloride 0.9 % 100 mL IVPB  Status:  Discontinued     1 g 200 mL/hr over 30 Minutes Intravenous Every 24 hours 08/16/19 1655 08/17/19 0613   08/16/19 1230  metroNIDAZOLE (FLAGYL) tablet 500 mg     500 mg Oral Every 12 hours 08/16/19 1218 08/30/19 0959   08/16/19 1230  tetracycline (SUMYCIN) capsule 500 mg     500 mg Oral 4 times daily 08/16/19 1218 08/30/19 1359   08/16/19 0100  vancomycin (VANCOCIN) 1,500 mg in sodium chloride 0.9 % 500 mL IVPB     1,500 mg 250 mL/hr over 120 Minutes Intravenous  Once 08/16/19 0045 08/16/19 0713   08/16/19 0045  ceFEPIme (MAXIPIME) 2 g in sodium chloride 0.9 % 100 mL IVPB     2 g 200 mL/hr over 30 Minutes Intravenous  Once 08/16/19 0041 08/16/19 0131   08/16/19 0045  metroNIDAZOLE (FLAGYL) IVPB 500 mg     500 mg 100 mL/hr over 60 Minutes Intravenous  Once 08/16/19 0041 08/16/19 0337   08/16/19 0045  vancomycin (VANCOCIN) IVPB 1000 mg/200 mL premix   Status:  Discontinued     1,000 mg 200 mL/hr over 60 Minutes Intravenous  Once 08/16/19 0041 08/16/19 0045          Medications  Scheduled Meds: . atorvastatin  20 mg Oral QHS  . bismuth subsalicylate  366 mg Oral QID  . Chlorhexidine Gluconate Cloth  6 each Topical Daily  . exemestane  25 mg Oral QPC breakfast  . fluticasone  2 spray Each Nare Daily  . loratadine  10 mg Oral Daily  . metroNIDAZOLE  500 mg Oral Q12H  . potassium chloride SA  20 mEq Oral Daily  . tetracycline  500 mg Oral QID   Continuous Infusions: . pantoprozole (PROTONIX) infusion  8 mg/hr (08/19/19 1514)   PRN Meds:.acetaminophen **OR** acetaminophen, ondansetron (ZOFRAN) IV      Subjective:   Rutha Bouchard was seen and examined today.  Still having melanotic stools, no dizziness or lightheadedness. Patient denies chest pain, shortness of breath, abdominal pain, N/V/D/C, numbess, tingling. No acute events overnight.    Objective:   Vitals:   08/18/19 1836 08/18/19 2125 08/19/19 0531 08/19/19 1221  BP: (!) 120/50 (!) 125/54 111/61 (!) 135/58  Pulse: 84 87 87 97  Resp: 19 18 18 16   Temp: 98.8 F (37.1 C) 98.4 F (36.9 C) 98.2 F (36.8 C) 98.4 F (36.9 C)  TempSrc: Oral Oral Oral Oral  SpO2: 98% 98% 98% 100%  Weight:   69 kg   Height:        Intake/Output Summary (Last 24 hours) at 08/19/2019 1531 Last data filed at 08/19/2019 8099 Gross per 24 hour  Intake 340 ml  Output -  Net 340 ml     Wt Readings from Last 3 Encounters:  08/19/19 69 kg  08/04/19 66.6 kg  07/07/19 67.9 kg     Exam  General: Alert and oriented x 3, NAD  Eyes:   HEENT:  Atraumatic, normocephalic, normal oropharynx  Cardiovascular: S1 S2 auscultated, no murmurs, RRR  Respiratory: Clear to auscultation bilaterally, no wheezing, rales or rhonchi  Gastrointestinal: Soft, nontender, nondistended, + bowel sounds  Ext: no pedal edema bilaterally  Neuro: No new deficits  Musculoskeletal: No digital  cyanosis, clubbing  Skin: No rashes  Psych: Normal affect and demeanor, alert and oriented x3    Data Reviewed:  I have personally reviewed following labs and imaging studies  Micro Results Recent Results (from the past 240 hour(s))  Blood culture (routine x 2)     Status: None (Preliminary result)   Collection Time: 08/16/19 12:40 AM   Specimen: BLOOD RIGHT FOREARM  Result Value Ref Range Status   Specimen Description BLOOD RIGHT FOREARM  Final   Special Requests   Final    BOTTLES DRAWN AEROBIC AND ANAEROBIC Blood Culture adequate volume   Culture   Final    NO GROWTH 3 DAYS Performed at [facility], [address] [place], [place] [address]    Report Status PENDING  Incomplete  Urine culture     Status: Abnormal   Collection Time: 08/16/19 12:41 AM   Specimen: In/Out Cath Urine  Result Value Ref Range Status   Specimen Description   Final    IN/OUT CATH URINE Performed at [place] [address] [place], [place] [address]    Special Requests   Final    NONE Performed at [facility], [place] [address], [place], [place] [address]    Culture 1,000 COLONIES/mL PROTEUS MIRABILIS (A)  Final   Report Status 08/18/2019 FINAL  Final   Organism ID, Bacteria PROTEUS MIRABILIS (A)  Final      Susceptibility   Proteus mirabilis - MIC*    AMPICILLIN <=2 SENSITIVE Sensitive     CEFAZOLIN <=4 SENSITIVE Sensitive     CEFTRIAXONE <=1 SENSITIVE Sensitive     CIPROFLOXACIN <=0.25 SENSITIVE Sensitive     GENTAMICIN <=1 SENSITIVE Sensitive     IMIPENEM 4 SENSITIVE Sensitive     NITROFURANTOIN 128 RESISTANT Resistant     TRIMETH/SULFA <=20 SENSITIVE Sensitive     AMPICILLIN/SULBACTAM <=2 SENSITIVE Sensitive     PIP/TAZO <=4 SENSITIVE Sensitive     * 1,000 COLONIES/mL PROTEUS MIRABILIS  SARS  Coronavirus 2 [facility] order, Performed in [facility] hospital lab) Nasopharyngeal Nasopharyngeal Swab     Status: None   Collection Time:  08/16/19 12:43 AM   Specimen: Nasopharyngeal Swab  Result Value Ref Range Status   SARS Coronavirus 2 NEGATIVE NEGATIVE Final    Comment: (NOTE) If result is NEGATIVE SARS-CoV-2 target nucleic acids are NOT DETECTED. The SARS-CoV-2 RNA is generally detectable in upper and lower  respiratory specimens during the acute phase of infection. The lowest  concentration of SARS-CoV-2 viral copies this assay can detect is 250  copies / mL. A negative result does not preclude SARS-CoV-2 infection  and should not be used as the sole basis for treatment or other  patient management decisions.  A negative result may occur with  improper specimen collection / handling, submission of specimen other  than nasopharyngeal swab, presence of viral mutation(s) within the  areas targeted by this assay, and inadequate number of viral copies  (<250 copies / mL). A negative result must be combined with clinical  observations, patient history, and epidemiological information. If result is POSITIVE SARS-CoV-2 target nucleic acids are DETECTED. The SARS-CoV-2 RNA is generally detectable in upper and lower  respiratory specimens dur ing the acute phase of infection.  Positive  results are indicative of active infection with SARS-CoV-2.  Clinical  correlation with patient history and other diagnostic information is  necessary to determine patient infection status.  Positive results do  not rule out bacterial infection or co-infection with other viruses. If result is PRESUMPTIVE POSTIVE SARS-CoV-2 nucleic acids MAY BE PRESENT.   A presumptive positive result was obtained on the submitted specimen  and confirmed on repeat testing.  While 2019 novel coronavirus  (SARS-CoV-2) nucleic acids may be present in the submitted sample  additional confirmatory testing may be necessary for epidemiological  and / or clinical management purposes  to differentiate between  SARS-CoV-2 and other Sarbecovirus currently known to  infect humans.  If clinically indicated additional testing with an alternate test  methodology [phone]) is advised. The SARS-CoV-2 RNA is generally  detectable in upper and lower respiratory sp ecimens during the acute  phase of infection. The expected result is Negative. Fact Sheet for Patients:  StrictlyIdeas.no Fact Sheet for Healthcare Providers: BankingDealers.co.za This test is not yet approved or cleared by the [place] FDA and has been authorized for detection and/or diagnosis of SARS-CoV-2 by FDA under an Emergency Use Authorization (EUA).  This EUA will remain in effect (meaning this test can be used) for the duration of the COVID-19 declaration under Section 564(b)(1) of the Act, 21 U.S.C. section 360bbb-3(b)(1), unless the authorization is terminated or revoked sooner. Performed at [facility], [place] [address], [place], [place] [address]   Blood culture (routine x 2)     Status: Abnormal   Collection Time: 08/16/19 12:45 AM   Specimen: BLOOD  Result Value Ref Range Status   Specimen Description BLOOD RIGHT ANTECUBITAL  Final   Special Requests   Final    BOTTLES DRAWN AEROBIC AND ANAEROBIC Blood Culture adequate volume   Culture  Setup Time   Final    GRAM POSITIVE COCCI ANAEROBIC BOTTLE ONLY CRITICAL RESULT CALLED TO, READ BACK BY AND VERIFIED WITH: PHRMD J GRIMFLEY @0603  08/17/19 BY S GEZAHEGN    Culture (A)  Final    STAPHYLOCOCCUS SPECIES (COAGULASE NEGATIVE) THE SIGNIFICANCE OF ISOLATING THIS ORGANISM FROM A SINGLE SET OF BLOOD CULTURES WHEN MULTIPLE SETS ARE DRAWN IS UNCERTAIN. PLEASE NOTIFY  THE MICROBIOLOGY DEPARTMENT WITHIN ONE WEEK IF SPECIATION AND SENSITIVITIES ARE REQUIRED. Performed at [facility], [place] [address] [place], [place] [address]    Report Status 08/19/2019 FINAL  Final  Blood Culture ID Panel (Reflexed)     Status: Abnormal   Collection Time: 08/16/19 12:45 AM   Result Value Ref Range Status   Enterococcus species NOT DETECTED NOT DETECTED Final   Listeria monocytogenes NOT DETECTED NOT DETECTED Final   Staphylococcus species DETECTED (A) NOT DETECTED Final    Comment: Methicillin (oxacillin) susceptible coagulase negative staphylococcus. Possible blood culture contaminant (unless isolated from more than one blood culture draw or clinical case suggests pathogenicity). No antibiotic treatment is indicated for blood  culture contaminants. CRITICAL RESULT CALLED TO, READ BACK BY AND VERIFIED WITH: PHRMD J GRIMFLEY @0603  08/17/19 BY S GEZAHEGN    Staphylococcus aureus (BCID) NOT DETECTED NOT DETECTED Final   Methicillin resistance NOT DETECTED NOT DETECTED Final   Streptococcus species NOT DETECTED NOT DETECTED Final   Streptococcus agalactiae NOT DETECTED NOT DETECTED Final   Streptococcus pneumoniae NOT DETECTED NOT DETECTED Final   Streptococcus pyogenes NOT DETECTED NOT DETECTED Final   Acinetobacter baumannii NOT DETECTED NOT DETECTED Final   Enterobacteriaceae species NOT DETECTED NOT DETECTED Final   Enterobacter cloacae complex NOT DETECTED NOT DETECTED Final   Escherichia coli NOT DETECTED NOT DETECTED Final   Klebsiella oxytoca NOT DETECTED NOT DETECTED Final   Klebsiella pneumoniae NOT DETECTED NOT DETECTED Final   Proteus species NOT DETECTED NOT DETECTED Final   Serratia marcescens NOT DETECTED NOT DETECTED Final   Haemophilus influenzae NOT DETECTED NOT DETECTED Final   Neisseria meningitidis NOT DETECTED NOT DETECTED Final   Pseudomonas aeruginosa NOT DETECTED NOT DETECTED Final   Candida albicans NOT DETECTED NOT DETECTED Final   Candida glabrata NOT DETECTED NOT DETECTED Final   Candida krusei NOT DETECTED NOT DETECTED Final   Candida parapsilosis NOT DETECTED NOT DETECTED Final   Candida tropicalis NOT DETECTED NOT DETECTED Final    Comment: Performed at [facility], [place]. [address] [place], [place] [address]  MRSA PCR  Screening     Status: None   Collection Time: 08/16/19 10:44 AM   Specimen: Nasopharyngeal  Result Value Ref Range Status   MRSA by PCR NEGATIVE NEGATIVE Final    Comment:        The GeneXpert MRSA Assay (FDA approved for NASAL specimens only), is one component of a comprehensive MRSA colonization surveillance program. It is not intended to diagnose MRSA infection nor to guide or monitor treatment for MRSA infections. Performed at [facility], [place] [address], [place],  [address]     Radiology Reports Dg Chest Port 1 View  Result Date: 08/16/2019 CLINICAL DATA:  Dyspnea. History of blood transfusion, question volume overload EXAM: PORTABLE CHEST 1 VIEW COMPARISON:  Earlier the same day FINDINGS: Post treatment changes on the left including volume loss and increased density. There is radiation fibrosis and pleural fluid on a July 2020 CT. The right lung is clear. Normal heart size for technique. Coronary stenting. IMPRESSION: 1. No acute finding.  Stable compared to earlier today. 2. Post treatment changes in the left chest. The right lung is clear with no pulmonary edema. Electronically Signed   By: Monte Fantasia M.D.   On: 08/16/2019 06:00   Dg Chest Portable 1 View  Result Date: 08/16/2019 CLINICAL DATA:  Increasing shortness of breath. History of lung cancer. EXAM: PORTABLE CHEST 1  VIEW COMPARISON:  Chest CT 06/08/2019 FINDINGS: Chronic volume loss in the left hemithorax with blunting of the costophrenic angle. Left apical pleuroparenchymal opacity which represent scarring on prior CT. Unchanged heart size and mediastinal contours allowing for differences in technique and modality. There is aortic atherosclerosis. The right lung is clear. No pulmonary edema. IMPRESSION: 1. Chronic volume loss in the left hemithorax with pleural thickening and scarring. 2. No acute abnormality demonstrated radiographically. 3.  Aortic Atherosclerosis (ICD10-I70.0). 4.  Electronically Signed   By: Keith Rake M.D.   On: 08/16/2019 01:40    Lab Data:  CBC: Recent Labs  Lab 08/16/19 0039  08/16/19 2256 08/17/19 0918 08/17/19 1508 08/18/19 0555 08/19/19 0836 08/19/19 1420  WBC 9.1   < > 5.6 6.4  --  4.1 3.3* 3.8*  NEUTROABS 6.4  --  3.6 4.6  --   --   --   --   HGB 4.2*   < > 10.2* 10.5* 10.6* 9.0* 8.0* 8.2*  HCT 14.2*   < > 30.9* 33.0* 32.7* 28.0* 25.3* 26.3*  MCV 88.2   < > 88.5 91.4  --  91.5 93.4 93.9  PLT 230   < > 113* 128*  --  120* 126* 138*   < > = values in this interval not displayed.   Basic Metabolic Panel: Recent Labs  Lab 08/16/19 1116 08/16/19 2104 08/17/19 0918 08/18/19 0555 08/19/19 0836  NA 139 139 138 141 139  K 3.2* 3.6 3.4* 2.9* 3.5  CL 113* 111 111 113* 112*  CO2 20* 21* 20* 19* 19*  GLUCOSE 107* 98 101* 93 87  BUN 39* 36* 32* 26* 18  CREATININE 0.93 1.01* 0.91 0.91 0.88  CALCIUM 7.4* 7.7* 7.9* 7.7* 7.4*  MG  --   --   --   --  1.8   GFR: Estimated Creatinine Clearance: 53.1 mL/min (by C-G formula based on SCr of 0.88 mg/dL). Liver Function Tests: Recent Labs  Lab 08/16/19 0039 08/16/19 1116  AST 27 24  ALT 18 17  ALKPHOS 37* 30*  BILITOT 0.2* 0.5  PROT 5.4* 4.6*  ALBUMIN 3.0* 2.5*   No results for input(s): LIPASE, AMYLASE in the last 168 hours. No results for input(s): AMMONIA in the last 168 hours. Coagulation Profile: Recent Labs  Lab 08/16/19 0039 08/19/19 1420  INR 1.0 1.1   Cardiac Enzymes: No results for input(s): CKTOTAL, CKMB, CKMBINDEX, TROPONINI in the last 168 hours. BNP (last 3 results) No results for input(s): PROBNP in the last 8760 hours. HbA1C: No results for input(s): HGBA1C in the last 72 hours. CBG: No results for input(s): GLUCAP in the last 168 hours. Lipid Profile: No results for input(s): CHOL, HDL, LDLCALC, TRIG, CHOLHDL, LDLDIRECT in the last 72 hours. Thyroid Function Tests: No results for input(s): TSH, T4TOTAL, FREET4, T3FREE, THYROIDAB in the last 72  hours. Anemia Panel: No results for input(s): VITAMINB12, FOLATE, FERRITIN, TIBC, IRON, RETICCTPCT in the last 72 hours. Urine analysis:    Component Value Date/Time   COLORURINE YELLOW 08/16/2019 0040   APPEARANCEUR CLEAR 08/16/2019 0040   LABSPEC 1.015 08/16/2019 0040   PHURINE 5.0 08/16/2019 0040   GLUCOSEU NEGATIVE 08/16/2019 0040   HGBUR NEGATIVE 08/16/2019 0040   BILIRUBINUR NEGATIVE 08/16/2019 0040   KETONESUR NEGATIVE 08/16/2019 0040   PROTEINUR NEGATIVE 08/16/2019 0040   NITRITE NEGATIVE 08/16/2019 0040   LEUKOCYTESUR SMALL (A) 08/16/2019 0040     Ripudeep Rai M.D. [facility] 08/19/2019, 3:31 PM  Pager: [phone] Between 7am  to 7pm - call Pager - [phone]  After 7pm go to [email or web address] - password [email or web address]  Call night coverage person covering after 7pm

## 2019-08-19 NOTE — H&P (View-Only) (Signed)
[facility] Progress Note  Chief Complaint: Chronic blood loss anemia  History:  Wendy [name] had black stool with a bowel preparation, tapering off toward the end.  She still had some "flecks" of black material in her stool earlier today.  She has not had hematemesis.  Blood pressure and heart rate have remained stable.  Hemoglobin has continued to decrease, getting down to 8.0 this morning. Video capsule study showed active bleeding in the distal stomach with blood then having passed throughout much of the small bowel, obscuring small bowel visualization.  ROS: Cardiovascular: Denies chest pain Respiratory: Denies dyspnea Urinary: Denies dysuria Fatigue with exertion.  She was working with physical therapy when I first arrived on the floor. Objective:   Current Facility-Administered Medications:  .  acetaminophen (TYLENOL) tablet 650 mg, 650 mg, Oral, Q6H PRN, 650 mg at 08/18/19 1801 **OR** acetaminophen (TYLENOL) suppository 650 mg, 650 mg, Rectal, Q6H PRN, Jani Gravel, MD .  atorvastatin (LIPITOR) tablet 20 mg, 20 mg, Oral, QHS, Eugenie Filler, MD, 20 mg at 08/18/19 2221 .  bismuth subsalicylate (PEPTO BISMOL) chewable tablet 524 mg, 524 mg, Oral, QID, Eugenie Filler, MD, 524 mg at 08/19/19 1520 .  Chlorhexidine Gluconate Cloth 2 % PADS 6 each, 6 each, Topical, Daily, Eugenie Filler, MD, 6 each at 08/18/19 1322 .  exemestane (AROMASIN) tablet 25 mg, 25 mg, Oral, QPC breakfast, Eugenie Filler, MD, 25 mg at 08/19/19 1022 .  fluticasone (FLONASE) 50 MCG/ACT nasal spray 2 spray, 2 spray, Each Nare, Daily, Eugenie Filler, MD .  loratadine (CLARITIN) tablet 10 mg, 10 mg, Oral, Daily, Eugenie Filler, MD, 10 mg at 08/19/19 1023 .  metroNIDAZOLE (FLAGYL) tablet 500 mg, 500 mg, Oral, Q12H, Eugenie Filler, MD, 500 mg at 08/19/19 1023 .  ondansetron (ZOFRAN) injection 4 mg, 4 mg, Intravenous, Q6H PRN, Jani Gravel, MD, 4 mg at 08/19/19 1226 .  pantoprazole (PROTONIX) 80 mg in  sodium chloride 0.9 % 250 mL (0.32 mg/mL) infusion, 8 mg/hr, Intravenous, Continuous, Levin Erp, [place], Last Rate: 25 mL/hr at 08/19/19 1514, 8 mg/hr at 08/19/19 1514 .  potassium chloride SA (K-DUR) CR tablet 20 mEq, 20 mEq, Oral, Daily, Eugenie Filler, MD, 20 mEq at 08/19/19 1023 .  tetracycline (SUMYCIN) capsule 500 mg, 500 mg, Oral, QID, Shahmehdi, Seyed A, MD, 500 mg at 08/19/19 1023  . pantoprozole (PROTONIX) infusion 8 mg/hr (08/19/19 1514)     Vital signs in last 24 hrs: Vitals:   08/19/19 0531 08/19/19 1221  BP: 111/61 (!) 135/58  Pulse: 87 97  Resp: 18 16  Temp: 98.2 F (36.8 C) 98.4 F (36.9 C)  SpO2: 98% 100%    Intake/Output Summary (Last 24 hours) at 08/19/2019 1617 Last data filed at 08/19/2019 0926 Gross per 24 hour  Intake 340 ml  Output -  Net 340 ml     Physical Exam   HEENT: sclera anicteric, oral mucosa without lesions  Neck: supple, no thyromegaly, JVD or lymphadenopathy  Cardiac: RRR without murmurs, S1S2 heard, no peripheral edema  Pulm: clear to auscultation bilaterally, normal RR and effort noted  Abdomen: soft, no tenderness, with active bowel sounds. No guarding or palpable hepatosplenomegaly  Skin; warm and dry, no jaundice, + pale  Recent Labs:  CBC Latest Ref Rng & Units 08/19/2019 08/19/2019 08/18/2019  WBC 4.0 - 10.5 K/uL 3.8(L) 3.3(L) 4.1  Hemoglobin 12.0 - 15.0 g/dL 8.2(L) 8.0(L) 9.0(L)  Hematocrit 36.0 - 46.0 % 26.3(L) 25.3(L) 28.0(L)  Platelets  150 - 400 K/uL 138(L) 126(L) 120(L)    Recent Labs  Lab 08/19/19 1420  INR 1.1   CMP Latest Ref Rng & Units 08/19/2019 08/18/2019 08/17/2019  Glucose 70 - 99 mg/dL 87 93 101(H)  BUN 8 - 23 mg/dL 18 26(H) 32(H)  Creatinine 0.44 - 1.00 mg/dL 0.88 0.91 0.91  Sodium 135 - 145 mmol/L 139 141 138  Potassium 3.5 - 5.1 mmol/L 3.5 2.9(L) 3.4(L)  Chloride 98 - 111 mmol/L 112(H) 113(H) 111  CO2 22 - 32 mmol/L 19(L) 19(L) 20(L)  Calcium 8.9 - 10.3 mg/dL 7.4(L) 7.7(L) 7.9(L)  Total  Protein 6.5 - 8.1 g/dL - - -  Total Bilirubin 0.3 - 1.2 mg/dL - - -  Alkaline Phos 38 - 126 U/L - - -  AST 15 - 41 U/L - - -  ALT 0 - 44 U/L - - -   Video capsule study images were reviewed and discussed with Dr. Bryan Lemma, who read the study earlier today.  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  The culprit lesion is obscured by the overlying fresh blood.   She needs an upper endoscopy tomorrow, and is agreeable after thorough discussion of procedure and risks.  The benefits and risks of the planned procedure were described in detail with the patient or (when appropriate) their health care proxy.  Risks were outlined as including, but not limited to, bleeding, infection, perforation, adverse medication reaction leading to cardiac or pulmonary decompensation.  The limitation of incomplete mucosal visualization was also discussed.  No guarantees or warranties were given.  Patient at increased risk for cardiopulmonary complications of procedure due to medical comorbidities.  She is familiar with the procedure, having had it done early last month by [name].  In the meantime, serial hemoglobin and hematocrit, and I am encouraged that the afternoon value was stable from this morning.  Her INR remains normal.  Total time 35 minutes  Nelida Meuse III Office: [phone]

## 2019-08-20 ENCOUNTER — Encounter (HOSPITAL_COMMUNITY)
Admission: EM | Disposition: A | Discharge status: Home / Self Care | Payer: Self-pay | Source: Home / Self Care | Attending: Internal Medicine

## 2019-08-20 ENCOUNTER — Inpatient Hospital Stay (HOSPITAL_COMMUNITY): Payer: Medicare Other | Admitting: Certified Registered Nurse Anesthetist

## 2019-08-20 ENCOUNTER — Encounter (HOSPITAL_COMMUNITY): Payer: Self-pay

## 2019-08-20 DIAGNOSIS — K31819 Angiodysplasia of stomach and duodenum without bleeding: Secondary | ICD-10-CM

## 2019-08-20 HISTORY — PX: ESOPHAGOGASTRODUODENOSCOPY (EGD) WITH PROPOFOL: SHX5813

## 2019-08-20 HISTORY — PX: HOT HEMOSTASIS: SHX5433

## 2019-08-20 LAB — PROTIME-INR
INR: 1.2 (ref 0.8–1.2)
Prothrombin Time: 15.2 seconds (ref 11.4–15.2)

## 2019-08-20 LAB — CBC
HCT: 24.3 % — ABNORMAL LOW (ref 36.0–46.0)
Hemoglobin: 7.5 g/dL — ABNORMAL LOW (ref 12.0–15.0)
MCH: 29.1 pg (ref 26.0–34.0)
MCHC: 30.9 g/dL (ref 30.0–36.0)
MCV: 94.2 fL (ref 80.0–100.0)
Platelets: 124 10*3/uL — ABNORMAL LOW (ref 150–400)
RBC: 2.58 MIL/uL — ABNORMAL LOW (ref 3.87–5.11)
RDW: 17.5 % — ABNORMAL HIGH (ref 11.5–15.5)
WBC: 3.1 10*3/uL — ABNORMAL LOW (ref 4.0–10.5)
nRBC: 0 % (ref 0.0–0.2)

## 2019-08-20 LAB — BASIC METABOLIC PANEL
Anion gap: 7 (ref 5–15)
BUN: 13 mg/dL (ref 8–23)
CO2: 20 mmol/L — ABNORMAL LOW (ref 22–32)
Calcium: 7.5 mg/dL — ABNORMAL LOW (ref 8.9–10.3)
Chloride: 113 mmol/L — ABNORMAL HIGH (ref 98–111)
Creatinine, Ser: 0.8 mg/dL (ref 0.44–1.00)
GFR calc Af Amer: >60 mL/min (ref >60)
GFR calc non Af Amer: >60 mL/min (ref >60)
Glucose, Bld: 92 mg/dL (ref 70–99)
Potassium: 3.3 mmol/L — ABNORMAL LOW (ref 3.5–5.1)
Sodium: 140 mmol/L (ref 135–145)

## 2019-08-20 LAB — PREPARE RBC (CROSSMATCH)

## 2019-08-20 SURGERY — ESOPHAGOGASTRODUODENOSCOPY (EGD) WITH PROPOFOL
Anesthesia: Monitor Anesthesia Care

## 2019-08-20 MED ORDER — PROPOFOL 10 MG/ML IV BOLUS
INTRAVENOUS | Status: DC | PRN
  Administered 2019-08-20: 30 mg via INTRAVENOUS

## 2019-08-20 MED ORDER — PROPOFOL 500 MG/50ML IV EMUL
INTRAVENOUS | Status: DC | PRN
  Administered 2019-08-20: 125 ug/kg/min via INTRAVENOUS

## 2019-08-20 MED ORDER — PROPOFOL 10 MG/ML IV BOLUS
INTRAVENOUS | Status: AC
  Filled 2019-08-20: qty 20

## 2019-08-20 MED ORDER — POTASSIUM CHLORIDE 10 MEQ/100ML IV SOLN
10.0000 meq | INTRAVENOUS | Status: AC
  Administered 2019-08-20 (×2): 10 meq via INTRAVENOUS
  Filled 2019-08-20: qty 200

## 2019-08-20 MED ORDER — SODIUM CHLORIDE 0.9% IV SOLUTION: Freq: Once | INTRAVENOUS | Status: DC

## 2019-08-20 MED ORDER — PROPOFOL 10 MG/ML IV BOLUS
INTRAVENOUS | Status: AC
  Filled 2019-08-20: qty 40

## 2019-08-20 MED ORDER — PANTOPRAZOLE SODIUM 40 MG PO TBEC
40.0000 mg | DELAYED_RELEASE_TABLET | Freq: Two times a day (BID) | ORAL | Status: DC
  Administered 2019-08-21 – 2019-08-22 (×3): 40 mg via ORAL
  Filled 2019-08-20 (×3): qty 1

## 2019-08-20 MED ORDER — LACTATED RINGERS IV SOLN
INTRAVENOUS | Status: DC | PRN
  Administered 2019-08-20: 13:00:00 via INTRAVENOUS

## 2019-08-20 SURGICAL SUPPLY — 15 items

## 2019-08-20 NOTE — Progress Notes (Signed)
[facility]                                                                              Patient Demographics  Wendy [name], is a 78 y.o. female, DOB - 02/20/1941, ZOX:[id number]  Admit date - 08/15/2019   Admitting Physician Jani Gravel, MD  Outpatient Primary MD for the patient is Delorse Limber  Outpatient specialists:   LOS - 3  days   Medical records reviewed and are as summarized below:    Chief Complaint  Patient presents with  . Shortness of Breath       Brief summary   78 year old female history of hypertension, hyperlipidemia, coronary artery disease, paroxysmal A. fib, PVD on chronic oral iron who presents to the ED with shortness of breath, dizziness which had worsened in the presyncopal episode. Patient on admission noted to have a hemoglobin of 4.2 from 9.5 on 08/04/2019. Patient states recently had colonoscopy and upper endoscopy per [name] in August 2020 and was noted to have benign polyps which were removed. Patient states she was told upper endoscopy showed irritation of her esophagus which was felt to be secondary to reflux. Patient states she was prescribed a two-week course of Pylera and due to GI side effects only took 3 days of it. Patient transfused 3 units packed red blood cells hemoglobin currently at 7.5 from 4.2. Cardiac enzymes cycled with a troponin going from 61-561. Patient likely with a demand ischemia secondary to symptomatic anemia. Patient denies any ongoing chest pain. EKG with no signs of ischemia noted. 2D echo done and patient transfused an additional 2 units packed red blood cells.  Patient placed on IV PPI twice daily, bismuth, tetracycline, metronidazole.  GI consulted for further evaluation and management.  Trying to obtain records from patient's primary gastroenterologist, [name].    Assessment & Plan    Principal Problem:   Symptomatic anemia, acute blood loss anemia, upper GI bleed -Presented  with hemoglobin of 4.2, down from 9.5 on 9/9.  Received 5 units of packed RBC during this hospitalization -Had recent colonoscopy and upper endoscopy per her primary gastroenterologist, [name] in August 2020 noted to have some benign polyps which were removed and irritation in her esophagus felt secondary to reflux and prescribed a two-week course of Pylera which she only took 3 days of it due to GI side effects -Per patient still having some melanotic stools -Underwent capsule endoscopy on 9/24, showed mild gastritis, brisk bleeding in the gastric antrum with possible underlying AVM versus GAVE vs unseen also due to large volume bleeding.  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 -2D echo showed EF of 60 to 65%, mildly increased left ventricular posterior wall thickness.  Impaired relaxation.   -  No further cardiac work-up needed at this time.  GERD -Continue Protonix 40 mg twice a day  UTI Urine culture showed no growth, patient was placed on IV Rocephin, received 3 doses, discontinued on 9/23  1/4 blood cultures coagulase-negative staph -Likely contaminant, no acute issues  Hypotension -Likely due to #1, continue supportive care with packed RBC transfusion, IV fluids as needed -BP now improved  Hyperlipidemia Continue statin  History of metastatic breast cancer Oncology was consulted, no current acute issues, outpatient follow-up  Code Status: DNR DVT Prophylaxis: SCDs Family Communication: Discussed all imaging results, lab results, explained to the  patient   Disposition Plan: Will await GI clearance, hopefully if CBC stable no acute issues overnight, possible DC tomorrow  Time Spent in minutes 25 minutes  Procedures:  Video endoscopy Findings: - Complete study with adequate bowel preparation - Mild gastritis - Active, brisk bleeding in the gastric antrum, with possible underlying AVMs vs GAVE vs unseen ulcer due to large volume bleeding - Blood clots and blood noted throughout small bowel, without areas of additional bleeding - Possible small AVMs in mid and distal small bowel, but favor these images to be more likely old blood in the lumen  Consultants:   Gastroenterology  Antimicrobials:   Anti-infectives (From admission, onward)   Start     Dose/Rate Route Frequency Ordered Stop   08/19/19 1745  metroNIDAZOLE (FLAGYL) tablet 500 mg     500 mg Oral 3 times daily 08/19/19 1730 08/30/19 0959   08/17/19 0615  cefTRIAXone (ROCEPHIN) 2 g in sodium chloride 0.9 % 100 mL IVPB  Status:  Discontinued     2 g 200 mL/hr over 30 Minutes Intravenous Daily 08/17/19 0614 08/18/19 1408   08/16/19 2200  ceFEPIme (MAXIPIME) 2 g in sodium chloride 0.9 % 100 mL IVPB  Status:  Discontinued     2 g 200 mL/hr over 30 Minutes Intravenous Every 24 hours 08/16/19 0547 08/16/19 1655   08/16/19 1830  cefTRIAXone (ROCEPHIN) 1 g in sodium chloride 0.9 % 100 mL IVPB  Status:  Discontinued     1 g 200 mL/hr over 30 Minutes Intravenous Every 24 hours 08/16/19 1655 08/17/19 0613   08/16/19 1230  metroNIDAZOLE (FLAGYL) tablet 500 mg  Status:  Discontinued     500 mg Oral Every 12 hours 08/16/19 1218 08/19/19 1730   08/16/19 1230  tetracycline (SUMYCIN) capsule 500 mg     500 mg Oral 4 times daily 08/16/19 1218 08/30/19 1359   08/16/19 0100  vancomycin (VANCOCIN) 1,500 mg in sodium chloride 0.9 % 500 mL IVPB     1,500 mg 250 mL/hr over 120 Minutes Intravenous  Once 08/16/19 0045 08/16/19 0713   08/16/19 0045  ceFEPIme (MAXIPIME) 2 g in sodium chloride  0.9 % 100 mL IVPB     2 g 200 mL/hr over 30 Minutes Intravenous  Once 08/16/19 0041 08/16/19 0131   08/16/19 0045  metroNIDAZOLE (FLAGYL) IVPB 500 mg     500 mg 100 mL/hr over 60 Minutes Intravenous  Once 08/16/19 0041 08/16/19 0337   08/16/19 0045  vancomycin (VANCOCIN) IVPB 1000 mg/200 mL premix  Status:  Discontinued     1,000 mg 200 mL/hr over 60 Minutes Intravenous  Once 08/16/19 0041 08/16/19 0045         Medications  Scheduled Meds: . sodium chloride   Intravenous Once  . atorvastatin  20 mg Oral QHS  . bismuth subsalicylate  643 mg Oral QID  . exemestane  25  mg Oral QPC breakfast  . fluticasone  2 spray Each Nare Daily  . loratadine  10 mg Oral Daily  . metroNIDAZOLE  500 mg Oral TID  . pantoprazole  40 mg Oral BID AC  . potassium chloride SA  20 mEq Oral Daily  . tetracycline  500 mg Oral QID   Continuous Infusions:  PRN Meds:.acetaminophen **OR** acetaminophen, ondansetron (ZOFRAN) IV      Subjective:   Rutha Bouchard was seen and examined today.  Patient anxious about continuing GI bleed, hemoglobin down to 7.5 today.  No dizziness or lightheadedness at the time of my evaluation, however resting in bed. Patient denies chest pain, shortness of breath, abdominal pain, N/V/D/C, numbess, tingling..    Objective:   Vitals:   08/20/19 1420 08/20/19 1425 08/20/19 1428 08/20/19 1500  BP: (!) 113/44  (!) 127/55 (!) 153/73  Pulse: 84 79 79 80  Resp: (!) 23 (!) 23 (!) 28 18  Temp:    98.1 F (36.7 C)  TempSrc:    Oral  SpO2: 97% 97% 96% 96%  Weight:      Height:        Intake/Output Summary (Last 24 hours) at 08/20/2019 1605 Last data filed at 08/20/2019 1545 Gross per 24 hour  Intake 1632.09 ml  Output -  Net 1632.09 ml     Wt Readings from Last 3 Encounters:  08/20/19 70.2 kg  08/04/19 66.6 kg  07/07/19 67.9 kg   Physical Exam  General: Alert and oriented x 3, NAD  Eyes:   HEENT:  Atraumatic, normocephalic  Cardiovascular: S1 S2 clear,  RRR. No pedal edema b/l  Respiratory: CTAB, no wheezing, rales or rhonchi  Gastrointestinal: Soft, nontender, nondistended, NBS  Ext: no pedal edema bilaterally  Neuro: no new deficits  Musculoskeletal: No cyanosis, clubbing  Skin: No rashes  Psych: Normal affect and demeanor, alert and oriented x3    Data Reviewed:  I have personally reviewed following labs and imaging studies  Micro Results Recent Results (from the past 240 hour(s))  Blood culture (routine x 2)     Status: None (Preliminary result)   Collection Time: 08/16/19 12:40 AM   Specimen: BLOOD RIGHT FOREARM  Result Value Ref Range Status   Specimen Description BLOOD RIGHT FOREARM  Final   Special Requests   Final    BOTTLES DRAWN AEROBIC AND ANAEROBIC Blood Culture adequate volume   Culture   Final    NO GROWTH 4 DAYS Performed at [facility], [address] [place], [place] [address]    Report Status PENDING  Incomplete  Urine culture     Status: Abnormal   Collection Time: 08/16/19 12:41 AM   Specimen: In/Out Cath Urine  Result Value Ref Range Status   Specimen Description   Final    IN/OUT CATH URINE Performed at [place] [address], [place], [place] [address]    Special Requests   Final    NONE Performed at [facility], [place] [address] [place], [place] [address]    Culture 1,000 COLONIES/mL PROTEUS MIRABILIS (A)  Final   Report Status 08/18/2019 FINAL  Final   Organism ID, Bacteria PROTEUS MIRABILIS (A)  Final      Susceptibility   Proteus mirabilis - MIC*    AMPICILLIN <=2 SENSITIVE Sensitive     CEFAZOLIN <=4 SENSITIVE Sensitive     CEFTRIAXONE <=1 SENSITIVE Sensitive     CIPROFLOXACIN <=0.25 SENSITIVE Sensitive  GENTAMICIN <=1 SENSITIVE Sensitive     IMIPENEM 4 SENSITIVE Sensitive     NITROFURANTOIN 128 RESISTANT Resistant     TRIMETH/SULFA <=20 SENSITIVE Sensitive     AMPICILLIN/SULBACTAM <=2 SENSITIVE Sensitive     PIP/TAZO <=4  SENSITIVE Sensitive     * 1,000 COLONIES/mL PROTEUS MIRABILIS  SARS Coronavirus 2 [facility] order, Performed in [facility] hospital lab) Nasopharyngeal Nasopharyngeal Swab     Status: None   Collection Time: 08/16/19 12:43 AM   Specimen: Nasopharyngeal Swab  Result Value Ref Range Status   SARS Coronavirus 2 NEGATIVE NEGATIVE Final    Comment: (NOTE) If result is NEGATIVE SARS-CoV-2 target nucleic acids are NOT DETECTED. The SARS-CoV-2 RNA is generally detectable in upper and lower  respiratory specimens during the acute phase of infection. The lowest  concentration of SARS-CoV-2 viral copies this assay can detect is 250  copies / mL. A negative result does not preclude SARS-CoV-2 infection  and should not be used as the sole basis for treatment or other  patient management decisions.  A negative result may occur with  improper specimen collection / handling, submission of specimen other  than nasopharyngeal swab, presence of viral mutation(s) within the  areas targeted by this assay, and inadequate number of viral copies  (<250 copies / mL). A negative result must be combined with clinical  observations, patient history, and epidemiological information. If result is POSITIVE SARS-CoV-2 target nucleic acids are DETECTED. The SARS-CoV-2 RNA is generally detectable in upper and lower  respiratory specimens dur ing the acute phase of infection.  Positive  results are indicative of active infection with SARS-CoV-2.  Clinical  correlation with patient history and other diagnostic information is  necessary to determine patient infection status.  Positive results do  not rule out bacterial infection or co-infection with other viruses. If result is PRESUMPTIVE POSTIVE SARS-CoV-2 nucleic acids MAY BE PRESENT.   A presumptive positive result was obtained on the submitted specimen  and confirmed on repeat testing.  While 2019 novel coronavirus  (SARS-CoV-2) nucleic acids may be present in the  submitted sample  additional confirmatory testing may be necessary for epidemiological  and / or clinical management purposes  to differentiate between  SARS-CoV-2 and other Sarbecovirus currently known to infect humans.  If clinically indicated additional testing with an alternate test  methodology [phone]) is advised. The SARS-CoV-2 RNA is generally  detectable in upper and lower respiratory sp ecimens during the acute  phase of infection. The expected result is Negative. Fact Sheet for Patients:  StrictlyIdeas.no Fact Sheet for Healthcare Providers: BankingDealers.co.za This test is not yet approved or cleared by the [place] FDA and has been authorized for detection and/or diagnosis of SARS-CoV-2 by FDA under an Emergency Use Authorization (EUA).  This EUA will remain in effect (meaning this test can be used) for the duration of the COVID-19 declaration under Section 564(b)(1) of the Act, 21 U.S.C. section 360bbb-3(b)(1), unless the authorization is terminated or revoked sooner. Performed at [facility], [place] [address] [place], [place] [address]   Blood culture (routine x 2)     Status: Abnormal   Collection Time: 08/16/19 12:45 AM   Specimen: BLOOD  Result Value Ref Range Status   Specimen Description BLOOD RIGHT ANTECUBITAL  Final   Special Requests   Final    BOTTLES DRAWN AEROBIC AND ANAEROBIC Blood Culture adequate volume   Culture  Setup Time   Final    GRAM POSITIVE COCCI ANAEROBIC BOTTLE  ONLY CRITICAL RESULT CALLED TO, READ BACK BY AND VERIFIED WITH: PHRMD J GRIMFLEY @0603  08/17/19 BY S GEZAHEGN    Culture (A)  Final    STAPHYLOCOCCUS SPECIES (COAGULASE NEGATIVE) THE SIGNIFICANCE OF ISOLATING THIS ORGANISM FROM A SINGLE SET OF BLOOD CULTURES WHEN MULTIPLE SETS ARE DRAWN IS UNCERTAIN. PLEASE NOTIFY THE MICROBIOLOGY DEPARTMENT WITHIN ONE WEEK IF SPECIATION AND SENSITIVITIES ARE REQUIRED. Performed  at [facility], [place] [address], [place], [place] [address]    Report Status 08/19/2019 FINAL  Final  Blood Culture ID Panel (Reflexed)     Status: Abnormal   Collection Time: 08/16/19 12:45 AM  Result Value Ref Range Status   Enterococcus species NOT DETECTED NOT DETECTED Final   Listeria monocytogenes NOT DETECTED NOT DETECTED Final   Staphylococcus species DETECTED (A) NOT DETECTED Final    Comment: Methicillin (oxacillin) susceptible coagulase negative staphylococcus. Possible blood culture contaminant (unless isolated from more than one blood culture draw or clinical case suggests pathogenicity). No antibiotic treatment is indicated for blood  culture contaminants. CRITICAL RESULT CALLED TO, READ BACK BY AND VERIFIED WITH: PHRMD J GRIMFLEY @0603  08/17/19 BY S GEZAHEGN    Staphylococcus aureus (BCID) NOT DETECTED NOT DETECTED Final   Methicillin resistance NOT DETECTED NOT DETECTED Final   Streptococcus species NOT DETECTED NOT DETECTED Final   Streptococcus agalactiae NOT DETECTED NOT DETECTED Final   Streptococcus pneumoniae NOT DETECTED NOT DETECTED Final   Streptococcus pyogenes NOT DETECTED NOT DETECTED Final   Acinetobacter baumannii NOT DETECTED NOT DETECTED Final   Enterobacteriaceae species NOT DETECTED NOT DETECTED Final   Enterobacter cloacae complex NOT DETECTED NOT DETECTED Final   Escherichia coli NOT DETECTED NOT DETECTED Final   Klebsiella oxytoca NOT DETECTED NOT DETECTED Final   Klebsiella pneumoniae NOT DETECTED NOT DETECTED Final   Proteus species NOT DETECTED NOT DETECTED Final   Serratia marcescens NOT DETECTED NOT DETECTED Final   Haemophilus influenzae NOT DETECTED NOT DETECTED Final   Neisseria meningitidis NOT DETECTED NOT DETECTED Final   Pseudomonas aeruginosa NOT DETECTED NOT DETECTED Final   Candida albicans NOT DETECTED NOT DETECTED Final   Candida glabrata NOT DETECTED NOT DETECTED Final   Candida krusei NOT DETECTED NOT DETECTED Final    Candida parapsilosis NOT DETECTED NOT DETECTED Final   Candida tropicalis NOT DETECTED NOT DETECTED Final    Comment: Performed at [facility], [place]. [address], [place], [place] [address]  MRSA PCR Screening     Status: None   Collection Time: 08/16/19 10:44 AM   Specimen: Nasopharyngeal  Result Value Ref Range Status   MRSA by PCR NEGATIVE NEGATIVE Final    Comment:        The GeneXpert MRSA Assay (FDA approved for NASAL specimens only), is one component of a comprehensive MRSA colonization surveillance program. It is not intended to diagnose MRSA infection nor to guide or monitor treatment for MRSA infections. Performed at [facility], [place] [address] [place], [place] [address]     Radiology Reports Dg Chest Port 1 View  Result Date: 08/16/2019 CLINICAL DATA:  Dyspnea. History of blood transfusion, question volume overload EXAM: PORTABLE CHEST 1 VIEW COMPARISON:  Earlier the same day FINDINGS: Post treatment changes on the left including volume loss and increased density. There is radiation fibrosis and pleural fluid on a July 2020 CT. The right lung is clear. Normal heart size for technique. Coronary stenting. IMPRESSION: 1. No acute finding.  Stable compared to earlier today. 2. Post  treatment changes in the left chest. The right lung is clear with no pulmonary edema. Electronically Signed   By: Monte Fantasia M.D.   On: 08/16/2019 06:00   Dg Chest Portable 1 View  Result Date: 08/16/2019 CLINICAL DATA:  Increasing shortness of breath. History of lung cancer. EXAM: PORTABLE CHEST 1 VIEW COMPARISON:  Chest CT 06/08/2019 FINDINGS: Chronic volume loss in the left hemithorax with blunting of the costophrenic angle. Left apical pleuroparenchymal opacity which represent scarring on prior CT. Unchanged heart size and mediastinal contours allowing for differences in technique and modality. There is aortic atherosclerosis. The right lung is clear. No pulmonary  edema. IMPRESSION: 1. Chronic volume loss in the left hemithorax with pleural thickening and scarring. 2. No acute abnormality demonstrated radiographically. 3.  Aortic Atherosclerosis (ICD10-I70.0). 4. Electronically Signed   By: Keith Rake M.D.   On: 08/16/2019 01:40    Lab Data:  CBC: Recent Labs  Lab 08/16/19 0039  08/16/19 2256 08/17/19 0918  08/18/19 0555 08/19/19 0836 08/19/19 1420 08/19/19 1948 08/20/19 0752  WBC 9.1   < > 5.6 6.4  --  4.1 3.3* 3.8* 3.5* 3.1*  NEUTROABS 6.4  --  3.6 4.6  --   --   --   --   --   --   HGB 4.2*   < > 10.2* 10.5*   < > 9.0* 8.0* 8.2* 7.9* 7.5*  HCT 14.2*   < > 30.9* 33.0*   < > 28.0* 25.3* 26.3* 25.6* 24.3*  MCV 88.2   < > 88.5 91.4  --  91.5 93.4 93.9 94.5 94.2  PLT 230   < > 113* 128*  --  120* 126* 138* 126* 124*   < > = values in this interval not displayed.   Basic Metabolic Panel: Recent Labs  Lab 08/16/19 2104 08/17/19 0918 08/18/19 0555 08/19/19 0836 08/20/19 0424  NA 139 138 141 139 140  K 3.6 3.4* 2.9* 3.5 3.3*  CL 111 111 113* 112* 113*  CO2 21* 20* 19* 19* 20*  GLUCOSE 98 101* 93 87 92  BUN 36* 32* 26* 18 13  CREATININE 1.01* 0.91 0.91 0.88 0.80  CALCIUM 7.7* 7.9* 7.7* 7.4* 7.5*  MG  --   --   --  1.8  --    GFR: Estimated Creatinine Clearance: 58.5 mL/min (by C-G formula based on SCr of 0.8 mg/dL). Liver Function Tests: Recent Labs  Lab 08/16/19 0039 08/16/19 1116  AST 27 24  ALT 18 17  ALKPHOS 37* 30*  BILITOT 0.2* 0.5  PROT 5.4* 4.6*  ALBUMIN 3.0* 2.5*   No results for input(s): LIPASE, AMYLASE in the last 168 hours. No results for input(s): AMMONIA in the last 168 hours. Coagulation Profile: Recent Labs  Lab 08/16/19 0039 08/19/19 1420 08/20/19 0424  INR 1.0 1.1 1.2   Cardiac Enzymes: No results for input(s): CKTOTAL, CKMB, CKMBINDEX, TROPONINI in the last 168 hours. BNP (last 3 results) No results for input(s): PROBNP in the last 8760 hours. HbA1C: No results for input(s): HGBA1C in  the last 72 hours. CBG: No results for input(s): GLUCAP in the last 168 hours. Lipid Profile: No results for input(s): CHOL, HDL, LDLCALC, TRIG, CHOLHDL, LDLDIRECT in the last 72 hours. Thyroid Function Tests: No results for input(s): TSH, T4TOTAL, FREET4, T3FREE, THYROIDAB in the last 72 hours. Anemia Panel: No results for input(s): VITAMINB12, FOLATE, FERRITIN, TIBC, IRON, RETICCTPCT in the last 72 hours. Urine analysis:    Component Value Date/Time  COLORURINE YELLOW 08/16/2019 0040   APPEARANCEUR CLEAR 08/16/2019 0040   LABSPEC 1.015 08/16/2019 0040   PHURINE 5.0 08/16/2019 0040   GLUCOSEU NEGATIVE 08/16/2019 0040   HGBUR NEGATIVE 08/16/2019 0040   BILIRUBINUR NEGATIVE 08/16/2019 0040   KETONESUR NEGATIVE 08/16/2019 0040   PROTEINUR NEGATIVE 08/16/2019 0040   NITRITE NEGATIVE 08/16/2019 0040   LEUKOCYTESUR SMALL (A) 08/16/2019 0040     Ripudeep Rai M.D. [facility] 08/20/2019, 4:05 PM  Pager: [phone] Between 7am to 7pm - call Pager - [phone]  After 7pm go to [email or web address] - password [email or web address]  Call night coverage person covering after 7pm

## 2019-08-20 NOTE — Care Management Important Message (Signed)
Important Message  Patient Details IM Letter given to Cookie McGibboney RN to present to the Patient Name: Wendy [name] MRN: [id number] Date of Birth: [birth year]   Medicare Important Message Given:  Yes     Kerin Salen 08/20/2019, 11:15 AM

## 2019-08-20 NOTE — Transfer of Care (Signed)
Immediate Anesthesia Transfer of Care Note  Patient: Wendy [name]  Procedure(s) Performed: ESOPHAGOGASTRODUODENOSCOPY (EGD) WITH PROPOFOL (N/A )  Patient Location: PACU and Endoscopy Unit  Anesthesia Type:MAC  Level of Consciousness: awake, alert  and oriented  Airway & Oxygen Therapy: spontaneous breathing, [place]  Post-op Assessment: Report given to RN and Post -op Vital signs reviewed and stable  Post vital signs: Reviewed and stable  Last Vitals:  Vitals Value Taken Time  BP 104/39 08/20/19 1411  Temp    Pulse 84 08/20/19 1413  Resp 20 08/20/19 1413  SpO2 99 % 08/20/19 1413  Vitals shown include unvalidated device data.  Last Pain:  Vitals:   08/20/19 1310  TempSrc: Oral  PainSc: 0-No pain      Patients Stated Pain Goal: 0 (71/21/97 5883)  Complications: No apparent anesthesia complications

## 2019-08-20 NOTE — Interval H&P Note (Signed)
History and Physical Interval Note:  08/20/2019 1:20 PM  Wendy [name]  has presented today for surgery, with the diagnosis of Anemia, GI Bleed.  The various methods of treatment have been discussed with the patient and family. After consideration of risks, benefits and other options for treatment, the patient has consented to  Procedure(s): ESOPHAGOGASTRODUODENOSCOPY (EGD) WITH PROPOFOL (N/A) as a surgical intervention.  The patient's history has been reviewed, patient examined, no change in status, stable for surgery.  I have reviewed the patient's chart and labs.  Questions were answered to the patient's satisfaction.    Currently receiving 1 unit PRBCs for Hgb 7.5 this AM  Nelida Meuse III

## 2019-08-20 NOTE — Anesthesia Postprocedure Evaluation (Signed)
Anesthesia Post Note  Patient: [name]  Procedure(s) Performed: ESOPHAGOGASTRODUODENOSCOPY (EGD) WITH PROPOFOL (N/A )     Patient location during evaluation: PACU Anesthesia Type: MAC Level of consciousness: awake and alert and oriented Pain management: pain level controlled Vital Signs Assessment: post-procedure vital signs reviewed and stable Respiratory status: spontaneous breathing, nonlabored ventilation and respiratory function stable Cardiovascular status: blood pressure returned to baseline and stable Postop Assessment: no apparent nausea or vomiting Anesthetic complications: no    Last Vitals:  Vitals:   08/20/19 1425 08/20/19 1428  BP:  (!) 127/55  Pulse: 79 79  Resp: (!) 23 (!) 28  Temp:    SpO2: 97% 96%    Last Pain:  Vitals:   08/20/19 1428  TempSrc:   PainSc: 0-No pain                 Jacquie Lukes A.

## 2019-08-20 NOTE — Anesthesia Preprocedure Evaluation (Addendum)
Anesthesia Evaluation    Reviewed: Allergy & Precautions, H&P , Patient's Chart, lab work & pertinent test results  Airway Mallampati: II  TM Distance: >3 FB Neck ROM: full    Dental no notable dental hx.    Pulmonary neg pulmonary ROS, former smoker,    Pulmonary exam normal breath sounds clear to auscultation       Cardiovascular Exercise Tolerance: Good hypertension, + CAD, + Past MI, + Peripheral Vascular Disease and +CHF   Rhythm:regular Rate:Normal  IMPRESSIONS    1. Left ventricular ejection fraction, by visual estimation, is 60 to 65%. The left ventricle has normal function. Normal left ventricular size. Mildly increased left ventricular posterior wall thickness. There is no left ventricular hypertrophy.  2. Left ventricular diastolic Doppler parameters are consistent with impaired relaxation pattern of LV diastolic filling.  3. Global right ventricle has normal systolic function.The right ventricular size is normal. No increase in right ventricular wall thickness.  4. Left atrial size was normal.  5. Right atrial size was normal.  6. The pericardial effusion is circumferential.  7. The mitral valve is normal in structure. Mild mitral valve regurgitation. No evidence of mitral stenosis.  8. The tricuspid valve is normal in structure. Tricuspid valve regurgitation is mild.  9. The aortic valve is normal in structure. Aortic valve regurgitation was not visualized by color flow Doppler. Structurally normal aortic valve, with no evidence of sclerosis or stenosis. 10. The pulmonic valve was normal in structure. Pulmonic valve regurgitation is not visualized by color flow Doppler. 11. Mildly elevated pulmonary artery systolic pressure. 12. The inferior vena cava is normal in size with greater than 50% respiratory variability, suggesting right atrial pressure of 3 mmHg.   Neuro/Psych negative neurological ROS  negative psych ROS    GI/Hepatic Neg liver ROS,   Endo/Other  negative endocrine ROS  Renal/GU Renal disease  negative genitourinary   Musculoskeletal   Abdominal   Peds  Hematology  (+) anemia ,   Anesthesia Other Findings   Reproductive/Obstetrics negative OB ROS                             Anesthesia Physical  Anesthesia Plan  ASA: III  Anesthesia Plan: MAC   Post-op Pain Management:    Induction:   PONV Risk Score and Plan: Propofol infusion and Ondansetron  Airway Management Planned: Natural Airway  Additional Equipment:   Intra-op Plan:   Post-operative Plan:   Informed Consent: I have reviewed the patients History and Physical, chart, labs and discussed the procedure including the risks, benefits and alternatives for the proposed anesthesia with the patient or authorized representative who has indicated his/her understanding and acceptance.    Discussed DNR with patient and Suspend DNR.   Dental advisory given  Plan Discussed with: CRNA  Anesthesia Plan Comments:        Anesthesia Quick Evaluation

## 2019-08-20 NOTE — Op Note (Signed)
[facility] Patient Name: Wendy [name] Procedure Date: 08/20/2019 MRN: [id number] Attending MD: Estill Cotta. Danis , MD Date of Birth: [birth year] CSN: [id number] Age: 78 Admit Type: Inpatient Procedure:                Upper GI endoscopy Indications:              Acute post hemorrhagic anemia, Melena, Abnormal                            video capsule endoscopy (no bleeding source                            reported on EGD/colonoscopy at outside GI practice                            (([facility])) about 6 weeks ago - Capsule study this                            admission showed fresh bleeding in distal stomach) Providers:                Estill Cotta. [name], MD, Burtis Junes, RN, Lina Sar,                            Technician, Cherylynn Ridges, Technician, Dellie Catholic Referring MD:             [facility] Medicines:                Monitored Anesthesia Care Complications:            No immediate complications. Estimated Blood Loss:     Estimated blood loss was minimal. Procedure:                Pre-Anesthesia Assessment:                           - Prior to the procedure, a History and Physical                            was performed, and patient medications and                            allergies were reviewed. The patient's tolerance of                            previous anesthesia was also reviewed. The risks                            and benefits of the procedure and the sedation                            options and risks were discussed with the patient.                            All questions were answered, and informed consent  was obtained. Prior Anticoagulants: The patient has                            taken no previous anticoagulant or antiplatelet                            agents. ASA Grade Assessment: III - A patient with                            severe systemic disease. After reviewing the risks                            and  benefits, the patient was deemed in                            satisfactory condition to undergo the procedure.                           After obtaining informed consent, the endoscope was                            passed under direct vision. Throughout the                            procedure, the patient's blood pressure, pulse, and                            oxygen saturations were monitored continuously. The                            GIF-H190 ([id number]) Olympus gastroscope was                            introduced through the mouth, and advanced to the                            second part of duodenum. The upper GI endoscopy was                            accomplished without difficulty. The patient                            tolerated the procedure well. Scope In: Scope Out: Findings:      The stomach was normal.      A small amount of fresh blood was found in the prepyloric region of the       stomach and cleared with lavage. There was also a small amount in the       fundus, but cleared away without underlying lesion (blood from the       antrum that had settled in the dependent portion of the stomach).      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 Coagulation for hemostasis using argon plasma was       successful.  The exam of the stomach was otherwise normal.      The examined duodenum was normal. Impression:               - Normal stomach.                           - Red blood in the prepyloric region of the stomach.                           - Gastric antral vascular ectasia with bleeding.                            Treated with argon plasma coagulation (APC).                           - Normal examined duodenum.                           - No specimens collected. Moderate Sedation:      MAC sedation used Recommendation:           - Return patient to hospital ward for ongoing care.                           -  Resume regular diet.                           - Use Protonix (pantoprazole) 40 mg PO BID.                           - CBC tomorrow AM Procedure Code(s):        --- Professional ---                           [phone], Esophagogastroduodenoscopy, flexible,                            transoral; with control of bleeding, any method Diagnosis Code(s):        --- Professional ---                           K92.2, Gastrointestinal hemorrhage, unspecified                           K31.811, Angiodysplasia of stomach and duodenum                            with bleeding                           D62, Acute posthemorrhagic anemia                           K92.1, Melena (includes Hematochezia)                           R93.3, Abnormal findings on diagnostic imaging of  other parts of digestive tract CPT copyright 2019 [employer]. All rights reserved. The codes documented in this report are preliminary and upon coder review may  be revised to meet current compliance requirements. Korben Carcione L. [name], MD 08/20/2019 2:14:55 PM This report has been signed electronically. Number of Addenda: 0

## 2019-08-21 LAB — BASIC METABOLIC PANEL
Anion gap: 9 (ref 5–15)
BUN: 8 mg/dL (ref 8–23)
CO2: 19 mmol/L — ABNORMAL LOW (ref 22–32)
Calcium: 7.5 mg/dL — ABNORMAL LOW (ref 8.9–10.3)
Chloride: 111 mmol/L (ref 98–111)
Creatinine, Ser: 0.85 mg/dL (ref 0.44–1.00)
GFR calc Af Amer: >60 mL/min (ref >60)
GFR calc non Af Amer: >60 mL/min (ref >60)
Glucose, Bld: 77 mg/dL (ref 70–99)
Potassium: 3.7 mmol/L (ref 3.5–5.1)
Sodium: 139 mmol/L (ref 135–145)

## 2019-08-21 LAB — TYPE AND SCREEN
ABO/RH(D): O POS
Antibody Screen: NEGATIVE
Unit division: 0

## 2019-08-21 LAB — CBC
HCT: 30.5 % — ABNORMAL LOW (ref 36.0–46.0)
Hemoglobin: 9.3 g/dL — ABNORMAL LOW (ref 12.0–15.0)
MCH: 28.4 pg (ref 26.0–34.0)
MCHC: 30.5 g/dL (ref 30.0–36.0)
MCV: 93 fL (ref 80.0–100.0)
Platelets: 138 10*3/uL — ABNORMAL LOW (ref 150–400)
RBC: 3.28 MIL/uL — ABNORMAL LOW (ref 3.87–5.11)
RDW: 17 % — ABNORMAL HIGH (ref 11.5–15.5)
WBC: 3.7 10*3/uL — ABNORMAL LOW (ref 4.0–10.5)
nRBC: 0 % (ref 0.0–0.2)

## 2019-08-21 LAB — BPAM RBC
Blood Product Expiration Date: 202010262359
ISSUE DATE / TIME: 202009251219
Unit Type and Rh: 5100

## 2019-08-21 LAB — CULTURE, BLOOD (ROUTINE X 2)
Culture: NO GROWTH
Special Requests: ADEQUATE

## 2019-08-21 LAB — MAGNESIUM: Magnesium: 1.9 mg/dL (ref 1.7–2.4)

## 2019-08-21 MED ORDER — ONDANSETRON 8 MG PO TBDP: 8.0000 mg | ORAL_TABLET | Freq: Three times a day (TID) | ORAL | 3 refills | Status: DC | PRN

## 2019-08-21 MED ORDER — SODIUM CHLORIDE 0.9 % IV SOLN
510.0000 mg | Freq: Once | INTRAVENOUS | Status: AC
  Administered 2019-08-21: 510 mg via INTRAVENOUS
  Filled 2019-08-21: qty 17

## 2019-08-21 MED ORDER — BISMUTH SUBSALICYLATE 262 MG PO CHEW
524.0000 mg | CHEWABLE_TABLET | Freq: Four times a day (QID) | ORAL | Status: DC
  Administered 2019-08-21 – 2019-08-22 (×2): 524 mg via ORAL
  Filled 2019-08-21 (×5): qty 2

## 2019-08-21 NOTE — Progress Notes (Signed)
[facility]                                                                              Patient Demographics  Wendy [name], is a 78 y.o. female, DOB - 1940-12-03, PRF:[id number]  Admit date - 08/15/2019   Admitting Physician Jani Gravel, MD  Outpatient Primary MD for the patient is Wendy Jeans, [name]  Outpatient specialists:   LOS - 4  days   Medical records reviewed and are as summarized below:    Chief Complaint  Patient presents with   Shortness of Breath       Brief summary   78 year old female history of hypertension, hyperlipidemia, coronary artery disease, paroxysmal A. fib, PVD on chronic oral iron who presents to the ED with shortness of breath, dizziness which had worsened in the presyncopal episode. Patient on admission noted to have a hemoglobin of 4.2 from 9.5 on 08/04/2019. Patient states recently had colonoscopy and upper endoscopy per Dr. Earlean [name] in August 2020 and was noted to have benign polyps which were removed. Patient states she was told upper endoscopy showed irritation of her esophagus which was felt to be secondary to reflux. Patient states she was prescribed a two-week course of Pylera and due to GI side effects only took 3 days of it. Patient transfused 3 units packed red blood cells hemoglobin currently at 7.5 from 4.2. Cardiac enzymes cycled with a troponin going from 61-561. Patient likely with a demand ischemia secondary to symptomatic anemia. Patient denies any ongoing chest pain. EKG with no signs of ischemia noted. 2D echo done and patient transfused an additional 2 units packed red blood cells.  Patient placed on IV PPI twice daily, bismuth, tetracycline, metronidazole.  GI consulted for further evaluation and management.  Trying to obtain records from patient's primary gastroenterologist, Dr. Earlean [name].    Assessment & Plan    Principal Problem:   Symptomatic anemia, acute blood loss anemia, upper GI bleed -Presented  with hemoglobin of 4.2, down from 9.5 on 9/9.  Received 5 units of packed RBC  -Had recent colonoscopy and upper endoscopy per her primary gastroenterologist, Dr. Thana [name] in August 2020 noted to have some benign polyps which were removed and irritation in her esophagus felt secondary to reflux and prescribed a two-week course of Pylera which she only took 3 days of it due to GI side effects -Patient continued to have melanotic stools during hospitalization.  GI was consulted, underwent capsule endoscopy on 9/24, showed mild gastritis, brisk bleeding in the gastric antrum with possible underlying AVM versus GAVE vs unseen also due to large volume bleeding.  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  Patient reports that she was seen by GI, Dr. Loletha [name] this morning and recommended IV Feraheme.  1 dose ordered.  Nausea -Feeling somewhat nauseous today, continue Zofran as needed  Elevated troponin -Likely due to demand  ischemia, acute blood loss anemia -Currently no cardiac symptoms, no change in the EKG. -2D echo showed EF of 60 to 65%, mildly increased left ventricular posterior wall thickness.  Impaired relaxation.   - No further cardiac work-up needed at this time.  GERD with H. pylori -Continue Protonix 40 mg twice a day -Patient's GI, Dr. Thana [name] had placed her on H. pylori treatment for 2 weeks however patient only took 3 days as it gave her nausea and vomiting. -She currently is on H. pylori treatment while inpatient and is tolerating.  UTI Urine culture showed no growth, patient was placed on IV Rocephin, received 3 doses, discontinued on 9/23  1/4 blood cultures coagulase-negative  staph -Likely contaminant, no acute issues  Hypotension -Likely due to #1, continue supportive care with packed RBC transfusion, IV fluids as needed -BP now improved  Hyperlipidemia Continue statin  History of metastatic breast cancer Oncology was consulted, no current acute issues, outpatient follow-up  Code Status: DNR DVT Prophylaxis: SCDs Family Communication: Discussed all imaging results, lab results, explained to the patient   Disposition Plan: Likely DC home in a.m. if no acute issues overnight and CBC stable  Time Spent in minutes 25 minutes  Procedures:  Video endoscopy Findings: - Complete study with adequate bowel preparation - Mild gastritis - Active, brisk bleeding in the gastric antrum, with possible underlying AVMs vs GAVE vs unseen ulcer due to large volume bleeding - Blood clots and blood noted throughout small bowel, without areas of additional bleeding - Possible small AVMs in mid and distal small bowel, but favor these images to be more likely old blood in the lumen  Consultants:   Gastroenterology  Antimicrobials:   Anti-infectives (From admission, onward)   Start     Dose/Rate Route Frequency Ordered Stop   08/19/19 1745  metroNIDAZOLE (FLAGYL) tablet 500 mg     500 mg Oral 3 times daily 08/19/19 1730 08/30/19 0959   08/17/19 0615  cefTRIAXone (ROCEPHIN) 2 g in sodium chloride 0.9 % 100 mL IVPB  Status:  Discontinued     2 g 200 mL/hr over 30 Minutes Intravenous Daily 08/17/19 0614 08/18/19 1408   08/16/19 2200  ceFEPIme (MAXIPIME) 2 g in sodium chloride 0.9 % 100 mL IVPB  Status:  Discontinued     2 g 200 mL/hr over 30 Minutes Intravenous Every 24 hours 08/16/19 0547 08/16/19 1655   08/16/19 1830  cefTRIAXone (ROCEPHIN) 1 g in sodium chloride 0.9 % 100 mL IVPB  Status:  Discontinued     1 g 200 mL/hr over 30 Minutes Intravenous Every 24 hours 08/16/19 1655 08/17/19 0613   08/16/19 1230  metroNIDAZOLE (FLAGYL) tablet 500 mg  Status:   Discontinued     500 mg Oral Every 12 hours 08/16/19 1218 08/19/19 1730   08/16/19 1230  tetracycline (SUMYCIN) capsule 500 mg     500 mg Oral 4 times daily 08/16/19 1218 08/30/19 1359   08/16/19 0100  vancomycin (VANCOCIN) 1,500 mg in sodium chloride 0.9 % 500 mL IVPB     1,500 mg 250 mL/hr over 120 Minutes Intravenous  Once 08/16/19 0045 08/16/19 0713   08/16/19 0045  ceFEPIme (MAXIPIME) 2 g in sodium chloride 0.9 % 100 mL IVPB     2 g 200 mL/hr over 30 Minutes Intravenous  Once 08/16/19 0041 08/16/19 0131   08/16/19 0045  metroNIDAZOLE (FLAGYL) IVPB 500 mg     500 mg 100 mL/hr over 60 Minutes Intravenous  Once 08/16/19 0041 08/16/19 7408  08/16/19 0045  vancomycin (VANCOCIN) IVPB 1000 mg/200 mL premix  Status:  Discontinued     1,000 mg 200 mL/hr over 60 Minutes Intravenous  Once 08/16/19 0041 08/16/19 0045         Medications  Scheduled Meds:  sodium chloride   Intravenous Once   atorvastatin  20 mg Oral QHS   bismuth subsalicylate  921 mg Oral QID   exemestane  25 mg Oral QPC breakfast   fluticasone  2 spray Each Nare Daily   loratadine  10 mg Oral Daily   metroNIDAZOLE  500 mg Oral TID   pantoprazole  40 mg Oral BID AC   potassium chloride SA  20 mEq Oral Daily   tetracycline  500 mg Oral QID   Continuous Infusions:  PRN Meds:.acetaminophen **OR** acetaminophen, ondansetron (ZOFRAN) IV      Subjective:   Rutha Bouchard was seen and examined today.  At the time of my encounter, patient nauseous and dry heaving.  Hemoglobin stable.  No dizziness or lightheadedness.  No abdominal pain. Patient denies chest pain, shortness of breath, abdominal pain, numbess, tingling..    Objective:   Vitals:   08/20/19 1608 08/20/19 2234 08/21/19 0415 08/21/19 1302  BP: (!) 145/66 131/69 139/61 (!) 161/69  Pulse: 76 88 80 80  Resp: 12 19 20 20   Temp: 98.1 F (36.7 C) 98.9 F (37.2 C) 98.2 F (36.8 C) 98 F (36.7 C)  TempSrc: Oral Oral Oral Oral  SpO2: 99% 96%  95% 99%  Weight:   71.4 kg   Height:        Intake/Output Summary (Last 24 hours) at 08/21/2019 1947 Last data filed at 08/21/2019 1500 Gross per 24 hour  Intake 600 ml  Output --  Net 600 ml     Wt Readings from Last 3 Encounters:  08/21/19 71.4 kg  08/04/19 66.6 kg  07/07/19 67.9 kg   Physical Exam  General: Alert and oriented x 3, NAD  Eyes:   HEENT:  Atraumatic, normocephalic  Cardiovascular: S1 S2 clear, no murmurs, RRR. No pedal edema b/l  Respiratory: CTAB, no wheezing, rales or rhonchi  Gastrointestinal: Soft, nontender, nondistended, NBS  Ext: no pedal edema bilaterally  Neuro: no new deficits  Musculoskeletal: No cyanosis, clubbing  Skin: No rashes  Psych: Normal affect and demeanor, alert and oriented x3     Data Reviewed:  I have personally reviewed following labs and imaging studies  Micro Results Recent Results (from the past 240 hour(s))  Blood culture (routine x 2)     Status: None   Collection Time: 08/16/19 12:40 AM   Specimen: BLOOD RIGHT FOREARM  Result Value Ref Range Status   Specimen Description BLOOD RIGHT FOREARM  Final   Special Requests   Final    BOTTLES DRAWN AEROBIC AND ANAEROBIC Blood Culture adequate volume   Culture   Final    NO GROWTH 5 DAYS Performed at [facility], [address] [place], [place] [address]    Report Status 08/21/2019 FINAL  Final  Urine culture     Status: Abnormal   Collection Time: 08/16/19 12:41 AM   Specimen: In/Out Cath Urine  Result Value Ref Range Status   Specimen Description   Final    IN/OUT CATH URINE Performed at [facility] [address], [place], [place] [address]    Special Requests   Final    NONE Performed at [facility], [place] [place], [place]  [address]    Culture 1,000 COLONIES/mL PROTEUS MIRABILIS (A)  Final   Report Status 08/18/2019 FINAL  Final   Organism ID, Bacteria PROTEUS MIRABILIS (A)  Final       Susceptibility   Proteus mirabilis - MIC*    AMPICILLIN <=2 SENSITIVE Sensitive     CEFAZOLIN <=4 SENSITIVE Sensitive     CEFTRIAXONE <=1 SENSITIVE Sensitive     CIPROFLOXACIN <=0.25 SENSITIVE Sensitive     GENTAMICIN <=1 SENSITIVE Sensitive     IMIPENEM 4 SENSITIVE Sensitive     NITROFURANTOIN 128 RESISTANT Resistant     TRIMETH/SULFA <=20 SENSITIVE Sensitive     AMPICILLIN/SULBACTAM <=2 SENSITIVE Sensitive     PIP/TAZO <=4 SENSITIVE Sensitive     * 1,000 COLONIES/mL PROTEUS MIRABILIS  SARS Coronavirus 2 [facility] order, Performed in [facility] hospital lab) Nasopharyngeal Nasopharyngeal Swab     Status: None   Collection Time: 08/16/19 12:43 AM   Specimen: Nasopharyngeal Swab  Result Value Ref Range Status   SARS Coronavirus 2 NEGATIVE NEGATIVE Final    Comment: (NOTE) If result is NEGATIVE SARS-CoV-2 target nucleic acids are NOT DETECTED. The SARS-CoV-2 RNA is generally detectable in upper and lower  respiratory specimens during the acute phase of infection. The lowest  concentration of SARS-CoV-2 viral copies this assay can detect is 250  copies / mL. A negative result does not preclude SARS-CoV-2 infection  and should not be used as the sole basis for treatment or other  patient management decisions.  A negative result may occur with  improper specimen collection / handling, submission of specimen other  than nasopharyngeal swab, presence of viral mutation(s) within the  areas targeted by this assay, and inadequate number of viral copies  (<250 copies / mL). A negative result must be combined with clinical  observations, patient history, and epidemiological information. If result is POSITIVE SARS-CoV-2 target nucleic acids are DETECTED. The SARS-CoV-2 RNA is generally detectable in upper and lower  respiratory specimens dur ing the acute phase of infection.  Positive  results are indicative of active infection with SARS-CoV-2.  Clinical  correlation with patient history  and other diagnostic information is  necessary to determine patient infection status.  Positive results do  not rule out bacterial infection or co-infection with other viruses. If result is PRESUMPTIVE POSTIVE SARS-CoV-2 nucleic acids MAY BE PRESENT.   A presumptive positive result was obtained on the submitted specimen  and confirmed on repeat testing.  While 2019 novel coronavirus  (SARS-CoV-2) nucleic acids may be present in the submitted sample  additional confirmatory testing may be necessary for epidemiological  and / or clinical management purposes  to differentiate between  SARS-CoV-2 and other Sarbecovirus currently known to infect humans.  If clinically indicated additional testing with an alternate test  methodology [phone]) is advised. The SARS-CoV-2 RNA is generally  detectable in upper and lower respiratory sp ecimens during the acute  phase of infection. The expected result is Negative. Fact Sheet for Patients:  StrictlyIdeas.no Fact Sheet for Healthcare Providers: BankingDealers.co.za This test is not yet approved or cleared by the [place] FDA and has been authorized for detection and/or diagnosis of SARS-CoV-2 by FDA under an Emergency Use Authorization (EUA).  This EUA will remain in effect (meaning this test can be used) for the duration of the COVID-19 declaration under Section 564(b)(1) of the Act, 21 U.S.C. section 360bbb-3(b)(1), unless the authorization is terminated or revoked sooner. Performed at [facility], [place] [address] [place],  [place] [address]   Blood culture (routine x 2)     Status: Abnormal   Collection Time: 08/16/19 12:45 AM   Specimen: BLOOD  Result Value Ref Range Status   Specimen Description BLOOD RIGHT ANTECUBITAL  Final   Special Requests   Final    BOTTLES DRAWN AEROBIC AND ANAEROBIC Blood Culture adequate volume   Culture  Setup Time   Final    GRAM POSITIVE  COCCI ANAEROBIC BOTTLE ONLY CRITICAL RESULT CALLED TO, READ BACK BY AND VERIFIED WITH: PHRMD J GRIMFLEY @0603  08/17/19 BY S GEZAHEGN    Culture (A)  Final    STAPHYLOCOCCUS SPECIES (COAGULASE NEGATIVE) THE SIGNIFICANCE OF ISOLATING THIS ORGANISM FROM A SINGLE SET OF BLOOD CULTURES WHEN MULTIPLE SETS ARE DRAWN IS UNCERTAIN. PLEASE NOTIFY THE MICROBIOLOGY DEPARTMENT WITHIN ONE WEEK IF SPECIATION AND SENSITIVITIES ARE REQUIRED. Performed at [facility], [place] [address], [place], [place] [address]    Report Status 08/19/2019 FINAL  Final  Blood Culture ID Panel (Reflexed)     Status: Abnormal   Collection Time: 08/16/19 12:45 AM  Result Value Ref Range Status   Enterococcus species NOT DETECTED NOT DETECTED Final   Listeria monocytogenes NOT DETECTED NOT DETECTED Final   Staphylococcus species DETECTED (A) NOT DETECTED Final    Comment: Methicillin (oxacillin) susceptible coagulase negative staphylococcus. Possible blood culture contaminant (unless isolated from more than one blood culture draw or clinical case suggests pathogenicity). No antibiotic treatment is indicated for blood  culture contaminants. CRITICAL RESULT CALLED TO, READ BACK BY AND VERIFIED WITH: PHRMD J GRIMFLEY @0603  08/17/19 BY S GEZAHEGN    Staphylococcus aureus (BCID) NOT DETECTED NOT DETECTED Final   Methicillin resistance NOT DETECTED NOT DETECTED Final   Streptococcus species NOT DETECTED NOT DETECTED Final   Streptococcus agalactiae NOT DETECTED NOT DETECTED Final   Streptococcus pneumoniae NOT DETECTED NOT DETECTED Final   Streptococcus pyogenes NOT DETECTED NOT DETECTED Final   Acinetobacter baumannii NOT DETECTED NOT DETECTED Final   Enterobacteriaceae species NOT DETECTED NOT DETECTED Final   Enterobacter cloacae complex NOT DETECTED NOT DETECTED Final   Escherichia coli NOT DETECTED NOT DETECTED Final   Klebsiella oxytoca NOT DETECTED NOT DETECTED Final   Klebsiella pneumoniae NOT DETECTED NOT DETECTED  Final   Proteus species NOT DETECTED NOT DETECTED Final   Serratia marcescens NOT DETECTED NOT DETECTED Final   Haemophilus influenzae NOT DETECTED NOT DETECTED Final   Neisseria meningitidis NOT DETECTED NOT DETECTED Final   Pseudomonas aeruginosa NOT DETECTED NOT DETECTED Final   Candida albicans NOT DETECTED NOT DETECTED Final   Candida glabrata NOT DETECTED NOT DETECTED Final   Candida krusei NOT DETECTED NOT DETECTED Final   Candida parapsilosis NOT DETECTED NOT DETECTED Final   Candida tropicalis NOT DETECTED NOT DETECTED Final    Comment: Performed at [facility], [place]. [address] [place], [place] [address]  MRSA PCR Screening     Status: None   Collection Time: 08/16/19 10:44 AM   Specimen: Nasopharyngeal  Result Value Ref Range Status   MRSA by PCR NEGATIVE NEGATIVE Final    Comment:        The GeneXpert MRSA Assay (FDA approved for NASAL specimens only), is one component of a comprehensive MRSA colonization surveillance program. It is not intended to diagnose MRSA infection nor to guide or monitor treatment for MRSA infections. Performed at [facility], [place] [address], [place], [place] [address]     Radiology Reports Dg Chest [place] 1  View  Result Date: 08/16/2019 CLINICAL DATA:  Dyspnea. History of blood transfusion, question volume overload EXAM: PORTABLE CHEST 1 VIEW COMPARISON:  Earlier the same day FINDINGS: Post treatment changes on the left including volume loss and increased density. There is radiation fibrosis and pleural fluid on a July 2020 CT. The right lung is clear. Normal heart size for technique. Coronary stenting. IMPRESSION: 1. No acute finding.  Stable compared to earlier today. 2. Post treatment changes in the left chest. The right lung is clear with no pulmonary edema. Electronically Signed   By: Monte Fantasia M.D.   On: 08/16/2019 06:00   Dg Chest Portable 1 View  Result Date: 08/16/2019 CLINICAL DATA:  Increasing  shortness of breath. History of lung cancer. EXAM: PORTABLE CHEST 1 VIEW COMPARISON:  Chest CT 06/08/2019 FINDINGS: Chronic volume loss in the left hemithorax with blunting of the costophrenic angle. Left apical pleuroparenchymal opacity which represent scarring on prior CT. Unchanged heart size and mediastinal contours allowing for differences in technique and modality. There is aortic atherosclerosis. The right lung is clear. No pulmonary edema. IMPRESSION: 1. Chronic volume loss in the left hemithorax with pleural thickening and scarring. 2. No acute abnormality demonstrated radiographically. 3.  Aortic Atherosclerosis (ICD10-I70.0). 4. Electronically Signed   By: Keith Rake M.D.   On: 08/16/2019 01:40    Lab Data:  CBC: Recent Labs  Lab 08/16/19 0039  08/16/19 2256 08/17/19 0918  08/19/19 0836 08/19/19 1420 08/19/19 1948 08/20/19 0752 08/21/19 0423  WBC 9.1   < > 5.6 6.4   < > 3.3* 3.8* 3.5* 3.1* 3.7*  NEUTROABS 6.4  --  3.6 4.6  --   --   --   --   --   --   HGB 4.2*   < > 10.2* 10.5*   < > 8.0* 8.2* 7.9* 7.5* 9.3*  HCT 14.2*   < > 30.9* 33.0*   < > 25.3* 26.3* 25.6* 24.3* 30.5*  MCV 88.2   < > 88.5 91.4   < > 93.4 93.9 94.5 94.2 93.0  PLT 230   < > 113* 128*   < > 126* 138* 126* 124* 138*   < > = values in this interval not displayed.   Basic Metabolic Panel: Recent Labs  Lab 08/17/19 0918 08/18/19 0555 08/19/19 0836 08/20/19 0424 08/21/19 0423  NA 138 141 139 140 139  K 3.4* 2.9* 3.5 3.3* 3.7  CL 111 113* 112* 113* 111  CO2 20* 19* 19* 20* 19*  GLUCOSE 101* 93 87 92 77  BUN 32* 26* 18 13 8   CREATININE 0.91 0.91 0.88 0.80 0.85  CALCIUM 7.9* 7.7* 7.4* 7.5* 7.5*  MG  --   --  1.8  --  1.9   GFR: Estimated Creatinine Clearance: 55 mL/min (by C-G formula based on SCr of 0.85 mg/dL). Liver Function Tests: Recent Labs  Lab 08/16/19 0039 08/16/19 1116  AST 27 24  ALT 18 17  ALKPHOS 37* 30*  BILITOT 0.2* 0.5  PROT 5.4* 4.6*  ALBUMIN 3.0* 2.5*   No results  for input(s): LIPASE, AMYLASE in the last 168 hours. No results for input(s): AMMONIA in the last 168 hours. Coagulation Profile: Recent Labs  Lab 08/16/19 0039 08/19/19 1420 08/20/19 0424  INR 1.0 1.1 1.2   Cardiac Enzymes: No results for input(s): CKTOTAL, CKMB, CKMBINDEX, TROPONINI in the last 168 hours. BNP (last 3 results) No results for input(s): PROBNP in the last 8760 hours. HbA1C: No results for  input(s): HGBA1C in the last 72 hours. CBG: No results for input(s): GLUCAP in the last 168 hours. Lipid Profile: No results for input(s): CHOL, HDL, LDLCALC, TRIG, CHOLHDL, LDLDIRECT in the last 72 hours. Thyroid Function Tests: No results for input(s): TSH, T4TOTAL, FREET4, T3FREE, THYROIDAB in the last 72 hours. Anemia Panel: No results for input(s): VITAMINB12, FOLATE, FERRITIN, TIBC, IRON, RETICCTPCT in the last 72 hours. Urine analysis:    Component Value Date/Time   COLORURINE YELLOW 08/16/2019 0040   APPEARANCEUR CLEAR 08/16/2019 0040   LABSPEC 1.015 08/16/2019 0040   PHURINE 5.0 08/16/2019 0040   GLUCOSEU NEGATIVE 08/16/2019 0040   HGBUR NEGATIVE 08/16/2019 0040   BILIRUBINUR NEGATIVE 08/16/2019 0040   KETONESUR NEGATIVE 08/16/2019 0040   PROTEINUR NEGATIVE 08/16/2019 0040   NITRITE NEGATIVE 08/16/2019 0040   LEUKOCYTESUR SMALL (A) 08/16/2019 0040     Shaniya Tashiro M.D. [facility] 08/21/2019, 7:47 PM  Pager: [phone] Between 7am to 7pm - call Pager - [phone]  After 7pm go to [email or web address] - password [email or web address]  Call night coverage person covering after 7pm

## 2019-08-21 NOTE — Progress Notes (Addendum)
[name] Progress Note  Chief Complaint: Melena  History: I saw this patient at about 830 this morning, but was then pulled away for an emergency and had not been able to write my daily progress note until this evening.  Wendy [name] was feeling well when I saw her earlier today.  She had not had a bowel movement at least 24 hours, so there was certainly no melena.  She was not nauseated or vomiting, she was tolerating regular food without difficulty.  She is still feeling somewhat weakened by this illness, and has yet to get out of bed very much.  She therefore had some concerns about going home.  ROS: Cardiovascular: No chest pain Respiratory: No dyspnea Urinary: No dysuria  Objective:   Current Facility-Administered Medications:  .  0.9 %  sodium chloride infusion (Manually program via Guardrails IV Fluids), , Intravenous, Once, Danis, Estill Cotta III, MD .  acetaminophen (TYLENOL) tablet 650 mg, 650 mg, Oral, Q6H PRN, 650 mg at 08/20/19 2156 **OR** acetaminophen (TYLENOL) suppository 650 mg, 650 mg, Rectal, Q6H PRN, Nelida Meuse III, MD .  atorvastatin (LIPITOR) tablet 20 mg, 20 mg, Oral, QHS, Nelida Meuse III, MD, 20 mg at 08/20/19 2157 .  bismuth subsalicylate (PEPTO BISMOL) chewable tablet 524 mg, 524 mg, Oral, QID, Rai, Ripudeep K, MD .  exemestane (AROMASIN) tablet 25 mg, 25 mg, Oral, QPC breakfast, Danis, Estill Cotta III, MD, 25 mg at 08/21/19 1113 .  fluticasone (FLONASE) 50 MCG/ACT nasal spray 2 spray, 2 spray, Each Nare, Daily, Danis, Henry L III, MD .  loratadine (CLARITIN) tablet 10 mg, 10 mg, Oral, Daily, Danis, Estill Cotta III, MD, 10 mg at 08/19/19 1023 .  metroNIDAZOLE (FLAGYL) tablet 500 mg, 500 mg, Oral, TID, Danis, Estill Cotta III, MD, 500 mg at 08/21/19 1529 .  ondansetron (ZOFRAN) injection 4 mg, 4 mg, Intravenous, Q6H PRN, Nelida Meuse III, MD, 4 mg at 08/19/19 1226 .  pantoprazole (PROTONIX) EC tablet 40 mg, 40 mg, Oral, BID AC, Nelida Meuse III, MD, 40 mg at 08/21/19 1839 .   potassium chloride SA (K-DUR) CR tablet 20 mEq, 20 mEq, Oral, Daily, Danis, Estill Cotta III, MD, 20 mEq at 08/21/19 1112 .  tetracycline (SUMYCIN) capsule 500 mg, 500 mg, Oral, QID, Danis, Estill Cotta III, MD, 500 mg at 08/21/19 1838     Vital signs in last 24 hrs: Vitals:   08/21/19 1302 08/21/19 2028  BP: (!) 161/69 (!) 149/65  Pulse: 80 91  Resp: 20 16  Temp: 98 F (36.7 C) 98.4 F (36.9 C)  SpO2: 99% 97%    Intake/Output Summary (Last 24 hours) at 08/21/2019 2131 Last data filed at 08/21/2019 1500 Gross per 24 hour  Intake 600 ml  Output -  Net 600 ml     Physical Exam She is well-appearing, pleasant and conversational  Cardiac: RRR without murmurs, S1S2 heard, no peripheral edema  Pulm: clear to auscultation bilaterally, normal RR and effort noted  Abdomen: soft, no tenderness, with active bowel sounds. No guarding or palpable hepatosplenomegaly  Skin; warm and dry, no jaundice, + pale  Recent Labs:  CBC Latest Ref Rng & Units 08/21/2019 08/20/2019 08/19/2019  WBC 4.0 - 10.5 K/uL 3.7(L) 3.1(L) 3.5(L)  Hemoglobin 12.0 - 15.0 g/dL 9.3(L) 7.5(L) 7.9(L)  Hematocrit 36.0 - 46.0 % 30.5(L) 24.3(L) 25.6(L)  Platelets 150 - 400 K/uL 138(L) 124(L) 126(L)    Recent Labs  Lab 08/20/19 0424  INR 1.2   CMP Latest  Ref Rng & Units 08/21/2019 08/20/2019 08/19/2019  Glucose 70 - 99 mg/dL 77 92 87  BUN 8 - 23 mg/dL 8 13 18   Creatinine 0.44 - 1.00 mg/dL 0.85 0.80 0.88  Sodium 135 - 145 mmol/L 139 140 139  Potassium 3.5 - 5.1 mmol/L 3.7 3.3(L) 3.5  Chloride 98 - 111 mmol/L 111 113(H) 112(H)  CO2 22 - 32 mmol/L 19(L) 20(L) 19(L)  Calcium 8.9 - 10.3 mg/dL 7.5(L) 7.5(L) 7.4(L)  Total Protein 6.5 - 8.1 g/dL - - -  Total Bilirubin 0.3 - 1.2 mg/dL - - -  Alkaline Phos 38 - 126 U/L - - -  AST 15 - 41 U/L - - -  ALT 0 - 44 U/L - - -     @ASSESSMENTPLANBEGIN @ Assessment: Melena Acute blood loss anemia Gastric antral vascular ectasia (GAVE), 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  It was treated with argon plasma coagulation with excellent effect.  Her hemoglobin is remained stable, she has no ongoing bleeding.  She had a recent endoscopic work-up by Dr. Earlie Raveling with [facility].  On that exam, GAVE was not reported, but the patient had a biopsy positive for H. pylori.  It was believed that her endoscopic findings were "gastritis".  Nevertheless, since the H. pylori was discovered, it should be treated.  She had only taken a few days of treatment before coming in and then stopped it because it was causing digestive upset.  That treatment was resumed at the beginning of his hospitalization, and it should be completed.  I think she is ready for discharge tomorrow, and should remain on oral iron twice a day, and it would probably help her to receive a dose of IV iron before discharge.  Naturally, she did get some iron in the PRBCs that she received.  Wendy [name] has a hematologist and oncologist who manages her breast cancer.  She has follow-up with them in a little over 2 weeks.  I recommend that her discharge planning includes arrangements for a CBC in 7 to 10 days after discharge to be followed by her hematologist.  She should follow-up with Dr. Earlie Raveling after this hospitalization, and a copy of the discharge summary should be sent to him as well.  He will want to follow-up on this patient's clinical events, and also needs to eventually test for H. pylori eradication as well as any further endoscopic therapy that may be needed for this condition.  She should be on pantoprazole 40 mg twice daily for 8 weeks to allow healing of the treated area in the gastric antrum.  Thank you for involving [place] in her care, call me as needed.   Nelida Meuse III Office: [phone]

## 2019-08-22 ENCOUNTER — Other Ambulatory Visit: Payer: Self-pay | Admitting: Cardiology

## 2019-08-22 LAB — BASIC METABOLIC PANEL
Anion gap: 8 (ref 5–15)
BUN: 6 mg/dL — ABNORMAL LOW (ref 8–23)
CO2: 19 mmol/L — ABNORMAL LOW (ref 22–32)
Calcium: 7.6 mg/dL — ABNORMAL LOW (ref 8.9–10.3)
Chloride: 111 mmol/L (ref 98–111)
Creatinine, Ser: 0.77 mg/dL (ref 0.44–1.00)
GFR calc Af Amer: >60 mL/min (ref >60)
GFR calc non Af Amer: >60 mL/min (ref >60)
Glucose, Bld: 94 mg/dL (ref 70–99)
Potassium: 3.4 mmol/L — ABNORMAL LOW (ref 3.5–5.1)
Sodium: 138 mmol/L (ref 135–145)

## 2019-08-22 LAB — CBC
HCT: 31.4 % — ABNORMAL LOW (ref 36.0–46.0)
Hemoglobin: 9.8 g/dL — ABNORMAL LOW (ref 12.0–15.0)
MCH: 28.8 pg (ref 26.0–34.0)
MCHC: 31.2 g/dL (ref 30.0–36.0)
MCV: 92.4 fL (ref 80.0–100.0)
Platelets: 146 10*3/uL — ABNORMAL LOW (ref 150–400)
RBC: 3.4 MIL/uL — ABNORMAL LOW (ref 3.87–5.11)
RDW: 16.7 % — ABNORMAL HIGH (ref 11.5–15.5)
WBC: 3.2 10*3/uL — ABNORMAL LOW (ref 4.0–10.5)
nRBC: 0 % (ref 0.0–0.2)

## 2019-08-22 MED ORDER — TETRACYCLINE HCL 500 MG PO CAPS: 500.0000 mg | ORAL_CAPSULE | Freq: Four times a day (QID) | ORAL | 0 refills | Status: AC

## 2019-08-22 MED ORDER — METRONIDAZOLE 500 MG PO TABS: 500.0000 mg | ORAL_TABLET | Freq: Three times a day (TID) | ORAL | 0 refills | Status: AC

## 2019-08-22 MED ORDER — OMEPRAZOLE 40 MG PO CPDR: 40.0000 mg | DELAYED_RELEASE_CAPSULE | Freq: Two times a day (BID) | ORAL | 0 refills | Status: DC

## 2019-08-22 NOTE — Progress Notes (Signed)
Discharge instructions reviewed with the pt at bedside. Currently awaiting for ride before discharge. MD notified to find out if pt needs to resume oral chemo medications at discharge.

## 2019-08-22 NOTE — Discharge Instructions (Signed)
Wendy [name],  You were in the hospital with GI bleeding. This has resolved after your EGD and physically stopping the bleeding. You will be discharged with medication to treat your H. Pylori in addition to a PPI (acid reducer). Please follow-up with your outpatient physicians.

## 2019-08-22 NOTE — Discharge Summary (Signed)
Physician Discharge Summary  Wendy [name]:[id number] DOB: [birth year] DOA: 08/15/2019  PCP: [name], [name]  Admit date: 08/15/2019 Discharge date: 08/22/2019  Admitted From: Home Disposition: Home  Recommendations for Outpatient Follow-up:  1. Follow up with PCP in 1 week 2. Follow up with outpatient GI physician 3. Please obtain BMP/CBC in one week to ten days 4. Please follow up on the following pending results: None  Home Health: None Equipment/Devices: None  Discharge Condition: Stable CODE STATUS: Full code Diet recommendation: Heart healthy   Brief/Interim Summary:  Admission HPI written by Jani Gravel, MD    HPI:   Wendy [name]  is a 78 y.o. female, w hypertension, hyperlipidemia, CAD, Pafib, PVD, presents with c/o sob, dizziness, worse last nite.  Felt like she was going to pass out.   Pt had recent colonoscopy-> + for polyps by Richmond Campbell.  Slight right upper quadrant pain from metastatic, cancer, but no brbpr, + black stool (chronic), from taking iron.  In ED,  T 98.4, P 111, R 24, Bp 118/87  , repeat 97/48,  Pox 100% on RA  CXR IMPRESSION: 1. Chronic volume loss in the left hemithorax with pleural thickening and scarring. 2. No acute abnormality demonstrated radiographically. 3. Aortic Atherosclerosis (ICD10-I70.0).  Wbc 9.1, Hgb 4.2, Plt 230 Na 142, K 3.4,  Bun 42, Creatinine 1.13 Alb 3.0,  Ast 27, Alt 18 Trop 61 INR 1.0 Urinalysis  Wbc 11-20, rbc 0-5 Blood culture x2 pending  covid -19 negative  Pt will be admitted for symptomatic anemia, and uti.      Hospital course:   Symptomatic anemia, acute blood loss anemia, upper GI bleed -Presented with hemoglobin of 4.2, down from 9.5 on 9/9.  Received 5 units of packed RBC  -Had recent colonoscopy and upper endoscopy per her primary gastroenterologist, [name] in August 2020 noted to have some benign polyps which were removed and irritation in her esophagus felt  secondary to reflux and prescribed a two-week course of Pylera which she only took 3 days of it due to GI side effects -Patient continued to have melanotic stools during hospitalization.  GI was consulted, underwent capsule endoscopy on 9/24, showed mild gastritis, brisk bleeding in the gastric antrum with possible underlying AVM versus GAVE vs unseen also due to large volume bleeding.  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 Given 1 dose of Feraheme IV.   Nausea -Feeling somewhat nauseous today, continue Zofran as needed  Elevated troponin -Likely due to demand ischemia, acute blood loss anemia -Currently no cardiac symptoms, no change in the EKG. -2D echo showed EF of 60 to 65%, mildly increased left ventricular posterior wall thickness.  Impaired relaxation.   -No further cardiac work-up needed at this time.  GERD with H. pylori -Continue Protonix 40 mg twice a day -Patient's GI, [name] had placed her on H. pylori treatment for 2 weeks however patient only took 3 days as it gave her nausea and vomiting. -She currently is on H. pylori treatment while inpatient and is tolerating. Continue tetracycline and Flagyl on discharge in addition to omeprazole 40 mg BID  UTI Urine culture showed no growth, patient was placed on IV Rocephin, received  3 doses, discontinued on 9/23  1/4 blood cultures coagulase-negative staph -Likely contaminant, no acute issues  Hypotension -Likely due to #1, continue supportive care with packed RBC transfusion, IV fluids as needed -BP now improved  Hyperlipidemia Continue statin  History of metastatic breast cancer Oncology was  consulted, no current acute issues, outpatient follow-up  Discharge Diagnoses:  Principal Problem:   Symptomatic anemia Active Problems:   Essential hypertension   Hyperlipidemia   CKD (chronic kidney disease), stage III (HCC)   Hypotension due to hypovolemia   Acute blood loss anemia   Elevated troponin   Demand ischemia (HCC)   Gastroesophageal reflux disease   Metastatic breast cancer (HCC)   Hypokalemia    Discharge Instructions  Discharge Instructions    Increase activity slowly   Complete by: As directed      Allergies as of 08/22/2019   No Known Allergies     Medication List    STOP taking these medications   aspirin EC 81 MG tablet     TAKE these medications   acetaminophen 500 MG tablet Commonly known as: TYLENOL Take 1,000 mg by mouth every 6 (six) hours as needed for mild pain.   Afinitor 10 MG tablet Generic drug: everolimus TAKE 1 TABLET (10 MG TOTAL) BY MOUTH DAILY. What changed: See the new instructions.   atorvastatin 20 MG tablet Commonly known as: LIPITOR Take 1 tablet (20 mg total) by mouth at bedtime.   CALCIUM 600 + D PO Take 1 tablet by mouth daily.   exemestane 25 MG tablet Commonly known as: AROMASIN Take 1 tablet (25 mg total) by mouth daily after breakfast.   hydrochlorothiazide 12.5 MG capsule Commonly known as: MICROZIDE Take 1 capsule (12.5 mg total) by mouth daily.   metroNIDAZOLE 500 MG tablet Commonly known as: FLAGYL Take 1 tablet (500 mg total) by mouth 3 (three) times daily for 8 days.   multivitamin with minerals Tabs tablet Take 1 tablet by mouth daily.   nitroGLYCERIN 0.4 MG SL tablet Commonly known as: NITROSTAT PLACE 1 TABLET (0.4 MG TOTAL) UNDER THE TONGUE EVERY 5 (FIVE) MINUTES AS NEEDED FOR CHEST PAIN.   omeprazole 40 MG capsule Commonly known as: PRILOSEC Take 1 capsule (40 mg total) by mouth 2 (two) times daily. What changed: when to take this   ondansetron 8 MG disintegrating tablet Commonly  known as: Zofran ODT Take 1 tablet (8 mg total) by mouth every 8 (eight) hours as needed for nausea or vomiting.   potassium chloride SA 20 MEQ tablet Commonly known as: K-DUR Take 1 tablet (20 mEq total) by mouth daily.   Slow Iron 160 (50 Fe) MG Tbcr SR tablet Generic drug: ferrous sulfate Take 1 tablet (160 mg total) by mouth daily.   tetracycline 500 MG capsule Commonly known as: SUMYCIN Take 1 capsule (500 mg total) by mouth 4 (four) times daily for 8 days.      Follow-up Information    [name], [name]. Schedule an appointment as soon as possible for a visit in 1 week(s).   Specialty: Family Medicine Contact information: [address] [place] [address] [phone]        Richmond Campbell, MD. Schedule an appointment as soon as possible for a visit in 1 week(s).   Specialty: Gastroenterology Why: Blood count in 7-10 days Contact information: [place] [address] [phone]          No Known Allergies  Consultations:  Gastroenterology  Procedures/Studies: Dg Chest Port 1 View  Result Date: 08/16/2019 CLINICAL DATA:  Dyspnea. History of blood transfusion, question volume overload EXAM: PORTABLE CHEST 1 VIEW COMPARISON:  Earlier the same day FINDINGS: Post treatment changes on the left including volume loss and increased density. There is radiation fibrosis and pleural fluid on a July 2020 CT. The right lung is clear. Normal heart size for technique. Coronary stenting. IMPRESSION: 1. No acute finding.  Stable compared to earlier today. 2. Post treatment changes in the left chest. The right lung is clear with no pulmonary edema. Electronically Signed   By: Monte Fantasia M.D.   On: 08/16/2019 06:00   Dg Chest Portable 1 View  Result Date: 08/16/2019 CLINICAL DATA:  Increasing shortness of breath. History of lung cancer. EXAM: PORTABLE CHEST 1 VIEW COMPARISON:  Chest CT 06/08/2019 FINDINGS: Chronic volume loss in the left  hemithorax with blunting of the costophrenic angle. Left apical pleuroparenchymal opacity which represent scarring on prior CT. Unchanged heart size and mediastinal contours allowing for differences in technique and modality. There is aortic atherosclerosis. The right lung is clear. No pulmonary edema. IMPRESSION: 1. Chronic volume loss in the left hemithorax with pleural thickening and scarring. 2. No acute abnormality demonstrated radiographically. 3.  Aortic Atherosclerosis (ICD10-I70.0). 4. Electronically Signed   By: Keith Rake M.D.   On: 08/16/2019 01:40     Procedures:  Video endoscopy Findings: - Complete study with adequate bowel preparation - Mild gastritis - Active, brisk bleeding in the gastric antrum, with possible underlying AVMs vs GAVE vs unseen ulcer due to large volume bleeding - Blood clots and blood noted throughout small bowel, without areas of additional bleeding - Possible small AVMs in mid and distal small bowel, but favor these images to be more likely old blood in the lumen  Upper endoscopy Findings:      The stomach was normal.      A small amount of fresh blood was found in the prepyloric region of the       stomach and cleared with lavage. There was also a small amount in the       fundus, but cleared away without underlying lesion (blood from the       antrum that had settled in the dependent portion of the stomach).      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 Coagulation for hemostasis using argon plasma was       successful.      The exam of the stomach was otherwise normal.      The examined duodenum was normal. Impression:               - Normal stomach.                           - Red blood in the prepyloric region of the stomach.                           - Gastric antral vascular ectasia with bleeding.                            Treated with argon plasma coagulation (APC).                            -  Normal examined duodenum.                           - No specimens collected.  Recommendation:           - Return patient to hospital ward for ongoing care.                           - Resume regular diet.                           - Use Protonix (pantoprazole) 40 mg PO BID.                           - CBC tomorrow AM    Subjective: No bleeding overnight.  Discharge Exam: Vitals:   08/21/19 2028 08/22/19 0425  BP: (!) 149/65 (!) 156/70  Pulse: 91 81  Resp: 16 16  Temp: 98.4 F (36.9 C) 98.1 F (36.7 C)  SpO2: 97% 96%   Vitals:   08/21/19 0415 08/21/19 1302 08/21/19 2028 08/22/19 0425  BP: 139/61 (!) 161/69 (!) 149/65 (!) 156/70  Pulse: 80 80 91 81  Resp: 20 20 16 16   Temp: 98.2 F (36.8 C) 98 F (36.7 C) 98.4 F (36.9 C) 98.1 F (36.7 C)  TempSrc: Oral Oral Oral Oral  SpO2: 95% 99% 97% 96%  Weight: 71.4 kg   73.6 kg  Height:        General: Pt is alert, awake, not in acute distress Cardiovascular: RRR, S1/S2 +, no rubs, no gallops Respiratory: CTA bilaterally, no wheezing, no rhonchi Abdominal: Soft, NT, ND, bowel sounds + Extremities: no edema, no cyanosis    The results of significant diagnostics from this hospitalization (including imaging, microbiology, ancillary and laboratory) are listed below for reference.     Microbiology: Recent Results (from the past 240 hour(s))  Blood culture (routine x 2)     Status: None   Collection Time: 08/16/19 12:40 AM   Specimen: BLOOD RIGHT FOREARM  Result Value Ref Range Status   Specimen Description BLOOD RIGHT FOREARM  Final   Special Requests   Final    BOTTLES DRAWN AEROBIC AND ANAEROBIC Blood Culture adequate volume   Culture   Final    NO GROWTH 5 DAYS Performed at [facility], [address] [place], [place] [address]    Report Status 08/21/2019 FINAL  Final  Urine culture     Status: Abnormal   Collection Time: 08/16/19 12:41 AM   Specimen: In/Out Cath Urine  Result Value Ref Range  Status   Specimen Description   Final    IN/OUT CATH URINE Performed at [facility] [address], [place], [place] [address]    Special Requests   Final    NONE Performed at [facility], [place] [address], [place], [place] [address]    Culture 1,000 COLONIES/mL PROTEUS MIRABILIS (A)  Final   Report Status 08/18/2019 FINAL  Final   Organism ID, Bacteria PROTEUS MIRABILIS (A)  Final      Susceptibility   Proteus mirabilis - MIC*    AMPICILLIN <=2 SENSITIVE Sensitive     CEFAZOLIN <=4 SENSITIVE Sensitive     CEFTRIAXONE <=1 SENSITIVE Sensitive     CIPROFLOXACIN <=0.25 SENSITIVE Sensitive     GENTAMICIN <=1 SENSITIVE Sensitive  IMIPENEM 4 SENSITIVE Sensitive     NITROFURANTOIN 128 RESISTANT Resistant     TRIMETH/SULFA <=20 SENSITIVE Sensitive     AMPICILLIN/SULBACTAM <=2 SENSITIVE Sensitive     PIP/TAZO <=4 SENSITIVE Sensitive     * 1,000 COLONIES/mL PROTEUS MIRABILIS  SARS Coronavirus 2 [facility] order, Performed in [facility] hospital lab) Nasopharyngeal Nasopharyngeal Swab     Status: None   Collection Time: 08/16/19 12:43 AM   Specimen: Nasopharyngeal Swab  Result Value Ref Range Status   SARS Coronavirus 2 NEGATIVE NEGATIVE Final    Comment: (NOTE) If result is NEGATIVE SARS-CoV-2 target nucleic acids are NOT DETECTED. The SARS-CoV-2 RNA is generally detectable in upper and lower  respiratory specimens during the acute phase of infection. The lowest  concentration of SARS-CoV-2 viral copies this assay can detect is 250  copies / mL. A negative result does not preclude SARS-CoV-2 infection  and should not be used as the sole basis for treatment or other  patient management decisions.  A negative result may occur with  improper specimen collection / handling, submission of specimen other  than nasopharyngeal swab, presence of viral mutation(s) within the  areas targeted by this assay, and inadequate number of viral copies  (<250  copies / mL). A negative result must be combined with clinical  observations, patient history, and epidemiological information. If result is POSITIVE SARS-CoV-2 target nucleic acids are DETECTED. The SARS-CoV-2 RNA is generally detectable in upper and lower  respiratory specimens dur ing the acute phase of infection.  Positive  results are indicative of active infection with SARS-CoV-2.  Clinical  correlation with patient history and other diagnostic information is  necessary to determine patient infection status.  Positive results do  not rule out bacterial infection or co-infection with other viruses. If result is PRESUMPTIVE POSTIVE SARS-CoV-2 nucleic acids MAY BE PRESENT.   A presumptive positive result was obtained on the submitted specimen  and confirmed on repeat testing.  While 2019 novel coronavirus  (SARS-CoV-2) nucleic acids may be present in the submitted sample  additional confirmatory testing may be necessary for epidemiological  and / or clinical management purposes  to differentiate between  SARS-CoV-2 and other Sarbecovirus currently known to infect humans.  If clinically indicated additional testing with an alternate test  methodology [phone]) is advised. The SARS-CoV-2 RNA is generally  detectable in upper and lower respiratory sp ecimens during the acute  phase of infection. The expected result is Negative. Fact Sheet for Patients:  StrictlyIdeas.no Fact Sheet for Healthcare Providers: BankingDealers.co.za This test is not yet approved or cleared by the [place] FDA and has been authorized for detection and/or diagnosis of SARS-CoV-2 by FDA under an Emergency Use Authorization (EUA).  This EUA will remain in effect (meaning this test can be used) for the duration of the COVID-19 declaration under Section 564(b)(1) of the Act, 21 U.S.C. section 360bbb-3(b)(1), unless the authorization is terminated or revoked  sooner. Performed at [facility], [place] [address], [place], [place] [address]   Blood culture (routine x 2)     Status: Abnormal   Collection Time: 08/16/19 12:45 AM   Specimen: BLOOD  Result Value Ref Range Status   Specimen Description BLOOD RIGHT ANTECUBITAL  Final   Special Requests   Final    BOTTLES DRAWN AEROBIC AND ANAEROBIC Blood Culture adequate volume   Culture  Setup Time   Final    GRAM POSITIVE COCCI ANAEROBIC BOTTLE ONLY CRITICAL RESULT CALLED TO, READ BACK BY  AND VERIFIED WITH: PHRMD J GRIMFLEY @0603  08/17/19 BY S GEZAHEGN    Culture (A)  Final    STAPHYLOCOCCUS SPECIES (COAGULASE NEGATIVE) THE SIGNIFICANCE OF ISOLATING THIS ORGANISM FROM A SINGLE SET OF BLOOD CULTURES WHEN MULTIPLE SETS ARE DRAWN IS UNCERTAIN. PLEASE NOTIFY THE MICROBIOLOGY DEPARTMENT WITHIN ONE WEEK IF SPECIATION AND SENSITIVITIES ARE REQUIRED. Performed at [facility], [place] [address] [place], [place] [address]    Report Status 08/19/2019 FINAL  Final  Blood Culture ID Panel (Reflexed)     Status: Abnormal   Collection Time: 08/16/19 12:45 AM  Result Value Ref Range Status   Enterococcus species NOT DETECTED NOT DETECTED Final   Listeria monocytogenes NOT DETECTED NOT DETECTED Final   Staphylococcus species DETECTED (A) NOT DETECTED Final    Comment: Methicillin (oxacillin) susceptible coagulase negative staphylococcus. Possible blood culture contaminant (unless isolated from more than one blood culture draw or clinical case suggests pathogenicity). No antibiotic treatment is indicated for blood  culture contaminants. CRITICAL RESULT CALLED TO, READ BACK BY AND VERIFIED WITH: PHRMD J GRIMFLEY @0603  08/17/19 BY S GEZAHEGN    Staphylococcus aureus (BCID) NOT DETECTED NOT DETECTED Final   Methicillin resistance NOT DETECTED NOT DETECTED Final   Streptococcus species NOT DETECTED NOT DETECTED Final   Streptococcus agalactiae NOT DETECTED NOT DETECTED Final   Streptococcus  pneumoniae NOT DETECTED NOT DETECTED Final   Streptococcus pyogenes NOT DETECTED NOT DETECTED Final   Acinetobacter baumannii NOT DETECTED NOT DETECTED Final   Enterobacteriaceae species NOT DETECTED NOT DETECTED Final   Enterobacter cloacae complex NOT DETECTED NOT DETECTED Final   Escherichia coli NOT DETECTED NOT DETECTED Final   Klebsiella oxytoca NOT DETECTED NOT DETECTED Final   Klebsiella pneumoniae NOT DETECTED NOT DETECTED Final   Proteus species NOT DETECTED NOT DETECTED Final   Serratia marcescens NOT DETECTED NOT DETECTED Final   Haemophilus influenzae NOT DETECTED NOT DETECTED Final   Neisseria meningitidis NOT DETECTED NOT DETECTED Final   Pseudomonas aeruginosa NOT DETECTED NOT DETECTED Final   Candida albicans NOT DETECTED NOT DETECTED Final   Candida glabrata NOT DETECTED NOT DETECTED Final   Candida krusei NOT DETECTED NOT DETECTED Final   Candida parapsilosis NOT DETECTED NOT DETECTED Final   Candida tropicalis NOT DETECTED NOT DETECTED Final    Comment: Performed at [facility], [place]. [address] [place], [place] [address]  MRSA PCR Screening     Status: None   Collection Time: 08/16/19 10:44 AM   Specimen: Nasopharyngeal  Result Value Ref Range Status   MRSA by PCR NEGATIVE NEGATIVE Final    Comment:        The GeneXpert MRSA Assay (FDA approved for NASAL specimens only), is one component of a comprehensive MRSA colonization surveillance program. It is not intended to diagnose MRSA infection nor to guide or monitor treatment for MRSA infections. Performed at [facility], [place] [address], [place], [place] [address]      Labs: BNP (last 3 results) No results for input(s): BNP in the last 8760 hours. Basic Metabolic Panel: Recent Labs  Lab 08/18/19 0555 08/19/19 0836 08/20/19 0424 08/21/19 0423 08/22/19 0336  NA 141 139 140 139 138  K 2.9* 3.5 3.3* 3.7 3.4*  CL 113* 112* 113* 111 111  CO2 19* 19* 20* 19* 19*  GLUCOSE 93 87  92 77 94  BUN 26* 18 13 8  6*  CREATININE 0.91 0.88 0.80 0.85 0.77  CALCIUM 7.7* 7.4* 7.5* 7.5* 7.6*  MG  --  1.8  --  1.9  --    Liver Function Tests: Recent Labs  Lab 08/16/19 0039 08/16/19 1116  AST 27 24  ALT 18 17  ALKPHOS 37* 30*  BILITOT 0.2* 0.5  PROT 5.4* 4.6*  ALBUMIN 3.0* 2.5*   No results for input(s): LIPASE, AMYLASE in the last 168 hours. No results for input(s): AMMONIA in the last 168 hours. CBC: Recent Labs  Lab 08/16/19 0039  08/16/19 2256 08/17/19 0918  08/19/19 1420 08/19/19 1948 08/20/19 0752 08/21/19 0423 08/22/19 0336  WBC 9.1   < > 5.6 6.4   < > 3.8* 3.5* 3.1* 3.7* 3.2*  NEUTROABS 6.4  --  3.6 4.6  --   --   --   --   --   --   HGB 4.2*   < > 10.2* 10.5*   < > 8.2* 7.9* 7.5* 9.3* 9.8*  HCT 14.2*   < > 30.9* 33.0*   < > 26.3* 25.6* 24.3* 30.5* 31.4*  MCV 88.2   < > 88.5 91.4   < > 93.9 94.5 94.2 93.0 92.4  PLT 230   < > 113* 128*   < > 138* 126* 124* 138* 146*   < > = values in this interval not displayed.   Cardiac Enzymes: No results for input(s): CKTOTAL, CKMB, CKMBINDEX, TROPONINI in the last 168 hours. BNP: Invalid input(s): POCBNP CBG: No results for input(s): GLUCAP in the last 168 hours. D-Dimer No results for input(s): DDIMER in the last 72 hours. Hgb A1c No results for input(s): HGBA1C in the last 72 hours. Lipid Profile No results for input(s): CHOL, HDL, LDLCALC, TRIG, CHOLHDL, LDLDIRECT in the last 72 hours. Thyroid function studies No results for input(s): TSH, T4TOTAL, T3FREE, THYROIDAB in the last 72 hours.  Invalid input(s): FREET3 Anemia work up No results for input(s): VITAMINB12, FOLATE, FERRITIN, TIBC, IRON, RETICCTPCT in the last 72 hours. Urinalysis    Component Value Date/Time   COLORURINE YELLOW 08/16/2019 0040   APPEARANCEUR CLEAR 08/16/2019 0040   LABSPEC 1.015 08/16/2019 0040   PHURINE 5.0 08/16/2019 0040   GLUCOSEU NEGATIVE 08/16/2019 0040   HGBUR NEGATIVE 08/16/2019 0040   BILIRUBINUR NEGATIVE  08/16/2019 0040   KETONESUR NEGATIVE 08/16/2019 0040   PROTEINUR NEGATIVE 08/16/2019 0040   NITRITE NEGATIVE 08/16/2019 0040   LEUKOCYTESUR SMALL (A) 08/16/2019 0040   Sepsis Labs Invalid input(s): PROCALCITONIN,  WBC,  LACTICIDVEN Microbiology Recent Results (from the past 240 hour(s))  Blood culture (routine x 2)     Status: None   Collection Time: 08/16/19 12:40 AM   Specimen: BLOOD RIGHT FOREARM  Result Value Ref Range Status   Specimen Description BLOOD RIGHT FOREARM  Final   Special Requests   Final    BOTTLES DRAWN AEROBIC AND ANAEROBIC Blood Culture adequate volume   Culture   Final    NO GROWTH 5 DAYS Performed at [facility], [address] [place], [place] [address]    Report Status 08/21/2019 FINAL  Final  Urine culture     Status: Abnormal   Collection Time: 08/16/19 12:41 AM   Specimen: In/Out Cath Urine  Result Value Ref Range Status   Specimen Description   Final    IN/OUT CATH URINE Performed at [place] [address], [place], [place] [address]    Special Requests   Final    NONE Performed at [facility], [place] [address] [place],  [address]    Culture 1,000  COLONIES/mL PROTEUS MIRABILIS (A)  Final   Report Status 08/18/2019 FINAL  Final   Organism ID, Bacteria PROTEUS MIRABILIS (A)  Final      Susceptibility   Proteus mirabilis - MIC*    AMPICILLIN <=2 SENSITIVE Sensitive     CEFAZOLIN <=4 SENSITIVE Sensitive     CEFTRIAXONE <=1 SENSITIVE Sensitive     CIPROFLOXACIN <=0.25 SENSITIVE Sensitive     GENTAMICIN <=1 SENSITIVE Sensitive     IMIPENEM 4 SENSITIVE Sensitive     NITROFURANTOIN 128 RESISTANT Resistant     TRIMETH/SULFA <=20 SENSITIVE Sensitive     AMPICILLIN/SULBACTAM <=2 SENSITIVE Sensitive     PIP/TAZO <=4 SENSITIVE Sensitive     * 1,000 COLONIES/mL PROTEUS MIRABILIS  SARS Coronavirus 2 [facility] order, Performed in [facility] hospital lab) Nasopharyngeal Nasopharyngeal Swab      Status: None   Collection Time: 08/16/19 12:43 AM   Specimen: Nasopharyngeal Swab  Result Value Ref Range Status   SARS Coronavirus 2 NEGATIVE NEGATIVE Final    Comment: (NOTE) If result is NEGATIVE SARS-CoV-2 target nucleic acids are NOT DETECTED. The SARS-CoV-2 RNA is generally detectable in upper and lower  respiratory specimens during the acute phase of infection. The lowest  concentration of SARS-CoV-2 viral copies this assay can detect is 250  copies / mL. A negative result does not preclude SARS-CoV-2 infection  and should not be used as the sole basis for treatment or other  patient management decisions.  A negative result may occur with  improper specimen collection / handling, submission of specimen other  than nasopharyngeal swab, presence of viral mutation(s) within the  areas targeted by this assay, and inadequate number of viral copies  (<250 copies / mL). A negative result must be combined with clinical  observations, patient history, and epidemiological information. If result is POSITIVE SARS-CoV-2 target nucleic acids are DETECTED. The SARS-CoV-2 RNA is generally detectable in upper and lower  respiratory specimens dur ing the acute phase of infection.  Positive  results are indicative of active infection with SARS-CoV-2.  Clinical  correlation with patient history and other diagnostic information is  necessary to determine patient infection status.  Positive results do  not rule out bacterial infection or co-infection with other viruses. If result is PRESUMPTIVE POSTIVE SARS-CoV-2 nucleic acids MAY BE PRESENT.   A presumptive positive result was obtained on the submitted specimen  and confirmed on repeat testing.  While 2019 novel coronavirus  (SARS-CoV-2) nucleic acids may be present in the submitted sample  additional confirmatory testing may be necessary for epidemiological  and / or clinical management purposes  to differentiate between  SARS-CoV-2 and other  Sarbecovirus currently known to infect humans.  If clinically indicated additional testing with an alternate test  methodology [phone]) is advised. The SARS-CoV-2 RNA is generally  detectable in upper and lower respiratory sp ecimens during the acute  phase of infection. The expected result is Negative. Fact Sheet for Patients:  StrictlyIdeas.no Fact Sheet for Healthcare Providers: BankingDealers.co.za This test is not yet approved or cleared by the [place] FDA and has been authorized for detection and/or diagnosis of SARS-CoV-2 by FDA under an Emergency Use Authorization (EUA).  This EUA will remain in effect (meaning this test can be used) for the duration of the COVID-19 declaration under Section 564(b)(1) of the Act, 21 U.S.C. section 360bbb-3(b)(1), unless the authorization is terminated or revoked sooner. Performed at [facility], [place] [address], [place], [place] [address]   Blood culture (  routine x 2)     Status: Abnormal   Collection Time: 08/16/19 12:45 AM   Specimen: BLOOD  Result Value Ref Range Status   Specimen Description BLOOD RIGHT ANTECUBITAL  Final   Special Requests   Final    BOTTLES DRAWN AEROBIC AND ANAEROBIC Blood Culture adequate volume   Culture  Setup Time   Final    GRAM POSITIVE COCCI ANAEROBIC BOTTLE ONLY CRITICAL RESULT CALLED TO, READ BACK BY AND VERIFIED WITH: [place] @0603  08/17/19 BY S GEZAHEGN    Culture (A)  Final    STAPHYLOCOCCUS SPECIES (COAGULASE NEGATIVE) THE SIGNIFICANCE OF ISOLATING THIS ORGANISM FROM A SINGLE SET OF BLOOD CULTURES WHEN MULTIPLE SETS ARE DRAWN IS UNCERTAIN. PLEASE NOTIFY THE MICROBIOLOGY DEPARTMENT WITHIN ONE WEEK IF SPECIATION AND SENSITIVITIES ARE REQUIRED. Performed at [facility], [place] [address], [place], [place] [address]    Report Status 08/19/2019 FINAL  Final  Blood Culture ID Panel (Reflexed)     Status: Abnormal    Collection Time: 08/16/19 12:45 AM  Result Value Ref Range Status   Enterococcus species NOT DETECTED NOT DETECTED Final   Listeria monocytogenes NOT DETECTED NOT DETECTED Final   Staphylococcus species DETECTED (A) NOT DETECTED Final    Comment: Methicillin (oxacillin) susceptible coagulase negative staphylococcus. Possible blood culture contaminant (unless isolated from more than one blood culture draw or clinical case suggests pathogenicity). No antibiotic treatment is indicated for blood  culture contaminants. CRITICAL RESULT CALLED TO, READ BACK BY AND VERIFIED WITH: PHRMD J GRIMFLEY @0603  08/17/19 BY S GEZAHEGN    Staphylococcus aureus (BCID) NOT DETECTED NOT DETECTED Final   Methicillin resistance NOT DETECTED NOT DETECTED Final   Streptococcus species NOT DETECTED NOT DETECTED Final   Streptococcus agalactiae NOT DETECTED NOT DETECTED Final   Streptococcus pneumoniae NOT DETECTED NOT DETECTED Final   Streptococcus pyogenes NOT DETECTED NOT DETECTED Final   Acinetobacter baumannii NOT DETECTED NOT DETECTED Final   Enterobacteriaceae species NOT DETECTED NOT DETECTED Final   Enterobacter cloacae complex NOT DETECTED NOT DETECTED Final   Escherichia coli NOT DETECTED NOT DETECTED Final   Klebsiella oxytoca NOT DETECTED NOT DETECTED Final   Klebsiella pneumoniae NOT DETECTED NOT DETECTED Final   Proteus species NOT DETECTED NOT DETECTED Final   Serratia marcescens NOT DETECTED NOT DETECTED Final   Haemophilus influenzae NOT DETECTED NOT DETECTED Final   Neisseria meningitidis NOT DETECTED NOT DETECTED Final   Pseudomonas aeruginosa NOT DETECTED NOT DETECTED Final   Candida albicans NOT DETECTED NOT DETECTED Final   Candida glabrata NOT DETECTED NOT DETECTED Final   Candida krusei NOT DETECTED NOT DETECTED Final   Candida parapsilosis NOT DETECTED NOT DETECTED Final   Candida tropicalis NOT DETECTED NOT DETECTED Final    Comment: Performed at [facility], [place]. [address], [place], [place] [address]  MRSA PCR Screening     Status: None   Collection Time: 08/16/19 10:44 AM   Specimen: Nasopharyngeal  Result Value Ref Range Status   MRSA by PCR NEGATIVE NEGATIVE Final    Comment:        The GeneXpert MRSA Assay (FDA approved for NASAL specimens only), is one component of a comprehensive MRSA colonization surveillance program. It is not intended to diagnose MRSA infection nor to guide or monitor treatment for MRSA infections. Performed at [facility], [place] [address] [place],  [address]      Time coordinating discharge: 35 minutes  SIGNED:   Cordelia Poche,  MD [facility] 08/22/2019, 9:59 AM

## 2019-08-23 ENCOUNTER — Telehealth: Payer: Self-pay

## 2019-08-23 ENCOUNTER — Encounter (HOSPITAL_COMMUNITY): Payer: Self-pay | Admitting: Gastroenterology

## 2019-08-23 NOTE — Telephone Encounter (Signed)
Attempted to reach out to patient, first call was answered, unable to understand what was said and call suddenly ended. Called back, phone rang once and went straight to voicemail. Will attempt to call patient tomorrow.

## 2019-08-24 ENCOUNTER — Telehealth: Payer: Self-pay

## 2019-08-24 ENCOUNTER — Telehealth: Payer: Self-pay | Admitting: Physician Assistant

## 2019-08-24 NOTE — Telephone Encounter (Signed)
Patient has been scheduled 08/30/19

## 2019-08-24 NOTE — Telephone Encounter (Signed)
Transition Care Management Follow-up Telephone Call   Date discharged? 9.27.20    How have you been since you were released from the hospital? Patient stated she is feeling much better   Do you understand why you were in the hospital? Yes, SOB, unable to stand, intestinal bleed   Do you understand the discharge instructions? yes   Where were you discharged to? Home, lives alone, family stopped by to help pick up the house yesterday    Items Reviewed:  Medications reviewed: no  Allergies reviewed: no  Dietary changes reviewed: no  Referrals reviewed: no   Functional Questionnaire:   Activities of Daily Living (ADLs):   She states they are independent in the following: ambulation, bathing and hygiene, feeding, continence, grooming, toileting and dressing States they require assistance with the following: N/A   Any transportation issues/concerns?: no   Any patient concerns? no   Confirmed importance and date/time of follow-up visits scheduled yes  Provider Appointment booked with  Confirmed with patient if condition begins to worsen call PCP or go to the ER.  Patient was given the office number and encouraged to call back with question or concerns.  : yes

## 2019-08-24 NOTE — Telephone Encounter (Signed)
Patient states hospitalist advised she should have bloodwork done by Friday to check her platelets. Please advise.

## 2019-08-24 NOTE — Telephone Encounter (Signed)
Per discharge summary needed follow-up and repeat blood count in 7 days from discharge. Ok to schedule appropriate time frame. Would schedule her for Monday

## 2019-08-27 MED FILL — AFINITOR 10 MG TABLET: 10 | 28 days supply | Qty: 28 | Fill #2

## 2019-08-27 MED FILL — EXEMESTANE 25 MG TABLET: 25 | 90 days supply | Qty: 90 | Fill #2

## 2019-08-30 ENCOUNTER — Encounter: Payer: Self-pay | Admitting: Physician Assistant

## 2019-08-30 ENCOUNTER — Ambulatory Visit (INDEPENDENT_AMBULATORY_CARE_PROVIDER_SITE_OTHER): Payer: Medicare Other | Admitting: Physician Assistant

## 2019-08-30 ENCOUNTER — Other Ambulatory Visit: Payer: Self-pay

## 2019-08-30 VITALS — BP 126/64 | HR 97 | Temp 98.1°F | Resp 14 | Ht 68.0 in | Wt 146.0 lb

## 2019-08-30 DIAGNOSIS — E876 Hypokalemia: Secondary | ICD-10-CM

## 2019-08-30 DIAGNOSIS — Z23 Encounter for immunization: Secondary | ICD-10-CM

## 2019-08-30 DIAGNOSIS — D509 Iron deficiency anemia, unspecified: Secondary | ICD-10-CM | POA: Diagnosis not present

## 2019-08-30 DIAGNOSIS — E861 Hypovolemia: Secondary | ICD-10-CM | POA: Diagnosis not present

## 2019-08-30 DIAGNOSIS — R7989 Other specified abnormal findings of blood chemistry: Secondary | ICD-10-CM

## 2019-08-30 DIAGNOSIS — A048 Other specified bacterial intestinal infections: Secondary | ICD-10-CM | POA: Diagnosis not present

## 2019-08-30 DIAGNOSIS — I9589 Other hypotension: Secondary | ICD-10-CM | POA: Diagnosis not present

## 2019-08-30 DIAGNOSIS — D62 Acute posthemorrhagic anemia: Secondary | ICD-10-CM | POA: Diagnosis not present

## 2019-08-30 DIAGNOSIS — R778 Other specified abnormalities of plasma proteins: Secondary | ICD-10-CM | POA: Diagnosis not present

## 2019-08-30 DIAGNOSIS — K31819 Angiodysplasia of stomach and duodenum without bleeding: Secondary | ICD-10-CM | POA: Diagnosis not present

## 2019-08-30 NOTE — Progress Notes (Signed)
Patient presents to clinic today for TCM visit/Hospital Follow-up. Patient presented to the ER on 08/15/2019 with complaints of SOB, dizziness and near-syncope. ER workup revealed stable WBC and platelet count, Hgb at 4.2, BUN 42, Creatinine at 1.13, UA with WBC, negative COVID test. 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 Patient treated for H. Pylori as well with Omeprazole 40 mg BID, Flagyl 500 mg TID and Tetracycline. Discharged to complete 8 more days of this regimen. Hypotension resolved with correction of hypovolemia. Patient given IV Feraheme prior to discharge. Was sent home on 08/22/2019 to follow-up with GI ([name]) and PCP. Repeat CBC/BMP recommend in 1 week of discharge..   Since discharge, patient endorses taking medications as directed. Has 1 day left of ABX. Denies heartburn, nausea, vomiting, abdominal pain. Denies hematochezia. Stool still darker but she is taking her iron supplement daily. Notes fatigue is much improved. Denies LH, dizziness or racing heart. Denies SOB. Had follow-up with GI this morning and was told things looked good. Has next follow-up on 09/08/2019. Patient notes no labs drawn at that visit. [name]   Past Medical History:  Diagnosis Date  . Arthritis   . Breast cancer dx'd 2014   left breast  . CAD (coronary artery disease)   . Hyperlipidemia   . Hypertension   . 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 Myocardial infarction ([place]) 11/22/04  . Peripheral vascular disease ([facility])    atherosclerosis carotid artery-mild    Current Outpatient Medications on File Prior to Visit  Medication Sig Dispense Refill  . acetaminophen (TYLENOL) 500 MG  tablet Take 1,000 mg by mouth every 6 (six) hours as needed for mild pain.    [name] AFINITOR 10 MG tablet TAKE 1 TABLET (10 MG TOTAL) BY MOUTH DAILY. (Patient taking differently: Take 10 mg by mouth daily. ) 28 tablet 3  . atorvastatin (LIPITOR) 20 MG tablet Take 1 tablet (20 mg total) by mouth at bedtime. 90 tablet 3  . Calcium Carb-Cholecalciferol (CALCIUM 600 + D PO) Take 1 tablet by mouth daily.    [name] exemestane (AROMASIN) 25 MG tablet Take 1 tablet (25 mg total) by mouth daily after breakfast. 90 tablet 3  . ferrous sulfate (SLOW IRON) 160 (50 Fe) MG TBCR SR tablet Take 1 tablet (160 mg total) by mouth daily. 30 tablet 0  . hydrochlorothiazide (MICROZIDE) 12.5 MG capsule TAKE 1 CAPSULE BY MOUTH EVERY DAY 90 capsule 1  . metroNIDAZOLE (FLAGYL) 500 MG tablet Take 1 tablet (500 mg total) by mouth 3 (three) times daily for 8 days. 24 tablet 0  . Multiple Vitamin (MULTIVITAMIN WITH MINERALS) TABS tablet Take 1 tablet by mouth daily.    . nitroGLYCERIN (NITROSTAT) 0.4 MG SL tablet PLACE 1 TABLET (0.4 MG TOTAL) UNDER THE TONGUE EVERY 5 (FIVE) MINUTES AS NEEDED FOR CHEST PAIN. 25 tablet 0  . omeprazole (PRILOSEC) 40 MG capsule Take 1 capsule (40 mg total) by mouth 2 (two) times daily. 60 capsule 0  . ondansetron (ZOFRAN ODT) 8 MG disintegrating tablet Take 1 tablet (8 mg total) by mouth every 8 (eight) hours as needed for nausea or vomiting. 30 tablet 3  . potassium chloride SA (K-DUR) 20 MEQ tablet  Take 1 tablet (20 mEq total) by mouth daily. 30 tablet 3  . tetracycline (SUMYCIN) 500 MG capsule Take 1 capsule (500 mg total) by mouth 4 (four) times daily for 8 days. 32 capsule 0   No current facility-administered medications on file prior to visit.     No Known Allergies  Family History  Problem Relation Age of Onset  . Emphysema Mother   . Heart attack Father     Social History   Socioeconomic History  . Marital status: Divorced    Spouse name: Not on file  . Number of children: 3  . Years  of education: Not on file  . Highest education level: Not on file  Occupational History    Employer: [employer]  . Financial resource strain: Not on file  . Food insecurity    Worry: Not on file    Inability: Not on file  . Transportation needs    Medical: Not on file    Non-medical: Not on file  Tobacco Use  . Smoking status: Former Smoker    Packs/day: 0.30    Types: Cigarettes    Quit date: 11/09/1983    Years since quitting: 35.8  . Smokeless tobacco: Never Used  Substance and Sexual Activity  . Alcohol use: Not Currently  . Drug use: No  . Sexual activity: Not Currently  Lifestyle  . Physical activity    Days per week: Not on file    Minutes per session: Not on file  . Stress: Not on file  Relationships  . Social Herbalist on phone: Not on file    Gets together: Not on file    Attends religious service: Not on file    Active member of club or organization: Not on file    Attends meetings of clubs or organizations: Not on file    Relationship status: Not on file  Other Topics Concern  . Not on file  Social History Narrative  . Not on file   Review of Systems - See HPI.  All other ROS are negative.  BP 126/64   Pulse 97   Temp 98.1 F (36.7 C) (Temporal)   Resp 14   Ht 5\' 8"  (1.727 m)   Wt 146 lb (66.2 kg)   SpO2 97%   BMI 22.20 kg/m   Physical Exam Vitals signs reviewed.  Constitutional:      Appearance: Normal appearance.  HENT:     Head: Normocephalic and atraumatic.     Right Ear: Tympanic membrane normal.     Left Ear: Tympanic membrane normal.     Mouth/Throat:     Mouth: Mucous membranes are moist.  Eyes:     Conjunctiva/sclera: Conjunctivae normal.     Pupils: Pupils are equal, round, and reactive to light.  Neck:     Musculoskeletal: Neck supple.  Cardiovascular:     Rate and Rhythm: Normal rate and regular rhythm.  Pulmonary:     Effort: Pulmonary effort is normal.     Breath sounds: Normal breath sounds.   Abdominal:     General: Bowel sounds are normal. There is no distension.     Palpations: Abdomen is soft.     Tenderness: There is no abdominal tenderness.  Neurological:     General: No focal deficit present.     Mental Status: She is alert and oriented to person, place, and time.  Psychiatric:        Mood and  Affect: Mood normal.     Recent Results (from the past 2160 hour(s))  CMP ([place] only)     Status: Abnormal   Collection Time: 06/08/19  9:13 AM  Result Value Ref Range   Sodium 140 135 - 145 mmol/L   Potassium 3.1 (L) 3.5 - 5.1 mmol/L   Chloride 103 98 - 111 mmol/L   CO2 24 22 - 32 mmol/L   Glucose, Bld 98 70 - 99 mg/dL   BUN 23 8 - 23 mg/dL   Creatinine 1.10 (H) 0.44 - 1.00 mg/dL   Calcium 8.8 (L) 8.9 - 10.3 mg/dL   Total Protein 7.0 6.5 - 8.1 g/dL   Albumin 3.4 (L) 3.5 - 5.0 g/dL   AST 44 (H) 15 - 41 U/L   ALT 26 0 - 44 U/L   Alkaline Phosphatase 70 38 - 126 U/L   Total Bilirubin 0.8 0.3 - 1.2 mg/dL   GFR, Est Non Af Am 48 (L) >60 mL/min   GFR, Est AFR Am 56 (L) >60 mL/min   Anion gap 13 5 - 15    Comment: Performed at [facility], [address] [place], [place] [address]  CBC with Differential ([place] Only)     Status: Abnormal   Collection Time: 06/08/19  9:13 AM  Result Value Ref Range   WBC Count 7.3 4.0 - 10.5 K/uL   RBC 4.11 3.87 - 5.11 MIL/uL   Hemoglobin 11.3 (L) 12.0 - 15.0 g/dL   HCT 35.4 (L) 36.0 - 46.0 %   MCV 86.1 80.0 - 100.0 fL   MCH 27.5 26.0 - 34.0 pg   MCHC 31.9 30.0 - 36.0 g/dL   RDW 17.4 (H) 11.5 - 15.5 %   Platelet Count 233 150 - 400 K/uL   nRBC 0.0 0.0 - 0.2 %   Neutrophils Relative % 64 %   Neutro Abs 4.6 1.7 - 7.7 K/uL   Lymphocytes Relative 11 %   Lymphs Abs 0.8 0.7 - 4.0 K/uL   Monocytes Relative 12 %   Monocytes Absolute 0.9 0.1 - 1.0 K/uL   Eosinophils Relative 12 %   Eosinophils Absolute 0.9 (H) 0.0 - 0.5 K/uL   Basophils Relative 1 %   Basophils Absolute 0.1 0.0 - 0.1 K/uL    Immature Granulocytes 0 %   Abs Immature Granulocytes 0.03 0.00 - 0.07 K/uL    Comment: Performed at [facility], [place] [address], [place], [place] [address]  SARS Coronavirus 2 (Performed in [facility] hospital lab)     Status: None   Collection Time: 06/22/19 12:38 PM   Specimen: Nasal Swab  Result Value Ref Range   SARS Coronavirus 2 NEGATIVE NEGATIVE    Comment: (NOTE) SARS-CoV-2 target nucleic acids are NOT DETECTED. The SARS-CoV-2 RNA is generally detectable in upper and lower respiratory specimens during the acute phase of infection. Negative results do not preclude SARS-CoV-2 infection, do not rule out co-infections with other pathogens, and should not be used as the sole basis for treatment or other patient management decisions. Negative results must be combined with clinical observations, patient history, and epidemiological information. The expected result is Negative. Fact Sheet for Patients: SugarRoll.be Fact Sheet for Healthcare Providers: https://www.woods-mathews.com/ This test is not yet approved or cleared by the [place] FDA and  has been authorized for detection and/or diagnosis of SARS-CoV-2 by FDA under an Emergency Use Authorization (EUA). This EUA will remain  in effect (meaning this  test can be used) for the duration of the COVID-19 declaration under Section 56 4(b)(1) of the Act, 21 U.S.C. section 360bbb-3(b)(1), unless the authorization is terminated or revoked sooner. Performed at [facility], [place] [address] [place], [place] [address]   CBC with Differential/Platelet     Status: Abnormal   Collection Time: 06/25/19 11:50 AM  Result Value Ref Range   WBC 5.2 4.0 - 10.5 K/uL   RBC 3.04 (L) 3.87 - 5.11 MIL/uL   Hemoglobin 8.1 (L) 12.0 - 15.0 g/dL   HCT 26.6 (L) 36.0 - 46.0 %   MCV 87.5 80.0 - 100.0 fL   MCH 26.6 26.0 - 34.0 pg   MCHC 30.5 30.0 - 36.0 g/dL   RDW 17.7 (H)  11.5 - 15.5 %   Platelets 264 150 - 400 K/uL   nRBC 0.0 0.0 - 0.2 %   Neutrophils Relative % 64 %   Neutro Abs 3.4 1.7 - 7.7 K/uL   Lymphocytes Relative 14 %   Lymphs Abs 0.7 0.7 - 4.0 K/uL   Monocytes Relative 13 %   Monocytes Absolute 0.7 0.1 - 1.0 K/uL   Eosinophils Relative 7 %   Eosinophils Absolute 0.4 0.0 - 0.5 K/uL   Basophils Relative 1 %   Basophils Absolute 0.1 0.0 - 0.1 K/uL   Immature Granulocytes 1 %   Abs Immature Granulocytes 0.03 0.00 - 0.07 K/uL    Comment: Performed at [facility], [place] [address] [place], [place] [address]  Comprehensive metabolic panel     Status: Abnormal   Collection Time: 06/25/19 11:50 AM  Result Value Ref Range   Sodium 140 135 - 145 mmol/L   Potassium 2.9 (L) 3.5 - 5.1 mmol/L   Chloride 104 98 - 111 mmol/L   CO2 26 22 - 32 mmol/L   Glucose, Bld 102 (H) 70 - 99 mg/dL   BUN 14 8 - 23 mg/dL   Creatinine, Ser 1.10 (H) 0.44 - 1.00 mg/dL   Calcium 8.9 8.9 - 10.3 mg/dL   Total Protein 6.9 6.5 - 8.1 g/dL   Albumin 3.6 3.5 - 5.0 g/dL   AST 33 15 - 41 U/L   ALT 20 0 - 44 U/L   Alkaline Phosphatase 56 38 - 126 U/L   Total Bilirubin 0.6 0.3 - 1.2 mg/dL   GFR calc non Af Amer 48 (L) >60 mL/min   GFR calc Af Amer 56 (L) >60 mL/min   Anion gap 10 5 - 15    Comment: Performed at [facility], [place] [address], [place], [place] [address]  Protime-INR     Status: None   Collection Time: 06/25/19 11:50 AM  Result Value Ref Range   Prothrombin Time 12.9 11.4 - 15.2 seconds   INR 1.0 0.8 - 1.2    Comment: (NOTE) INR goal varies based on device and disease states. Performed at [facility], [place] [address] [place], [place] [address]   Prepare RBC     Status: None   Collection Time: 06/25/19  1:29 PM  Result Value Ref Range   Order Confirmation      ORDER PROCESSED BY BLOOD BANK Performed at [place] [address], [place], [place] [address]   Type and screen      Status: None   Collection Time: 06/25/19  2:13 PM  Result Value Ref Range   ABO/RH(D) O POS    Antibody Screen NEG  Sample Expiration      06/25/2019,2359 Performed at [facility], [place] [address], [place], [place] [address]    Unit Number [id number]    Blood Component Type RED CELLS,LR    Unit division 00    Status of Unit REL FROM [facility]    Transfusion Status OK TO TRANSFUSE    Crossmatch Result Compatible   ABO/Rh     Status: None   Collection Time: 06/25/19  2:13 PM  Result Value Ref Range   ABO/RH(D)      O POS Performed at [facility], [place] [address] [place], [place] [address]   BPAM RBC     Status: None   Collection Time: 06/25/19  2:13 PM  Result Value Ref Range   Blood Product Unit Number [id number]    PRODUCT CODE [id number]    Unit Type and Rh 5100    Blood Product Expiration Date 671245809983   Type and screen     Status: None   Collection Time: 06/26/19  8:40 AM  Result Value Ref Range   ABO/RH(D) O POS    Antibody Screen NEG    Sample Expiration 06/29/2019,2359    Unit Number [id number]    Blood Component Type RED CELLS,LR    Unit division 00    Status of Unit ISSUED,FINAL    Transfusion Status OK TO TRANSFUSE    Crossmatch Result      Compatible Performed at [facility], [place] [address], [place], [place] [address]   ABO/Rh     Status: None   Collection Time: 06/26/19  8:40 AM  Result Value Ref Range   ABO/RH(D)      O POS Performed at [facility], [place] [address], [place], [place] [address]   BPAM RBC     Status: None   Collection Time: 06/26/19  8:40 AM  Result Value Ref Range   ISSUE DATE / TIME [id number]    Blood Product Unit Number [id number]    PRODUCT CODE [id number]    Unit Type and Rh 5100    Blood Product Expiration Date [id number]   CMP ([place] only)     Status: Abnormal   Collection Time: 07/07/19  8:55 AM  Result Value Ref Range    Sodium 141 135 - 145 mmol/L   Potassium 3.0 (LL) 3.5 - 5.1 mmol/L    Comment: CRITICAL RESULT CALLED TO, READ BACK BY AND VERIFIED WITH: KELSEY HUBER,RN AT 1022 BY MARSHA KERR(MT)    Chloride 104 98 - 111 mmol/L   CO2 24 22 - 32 mmol/L   Glucose, Bld 119 (H) 70 - 99 mg/dL   BUN 14 8 - 23 mg/dL   Creatinine 1.05 (H) 0.44 - 1.00 mg/dL   Calcium 9.0 8.9 - 10.3 mg/dL   Total Protein 6.8 6.5 - 8.1 g/dL   Albumin 3.5 3.5 - 5.0 g/dL   AST 34 15 - 41 U/L   ALT 22 0 - 44 U/L   Alkaline Phosphatase 61 38 - 126 U/L   Total Bilirubin 0.6 0.3 - 1.2 mg/dL   GFR, Est Non Af Am 51 (L) >60 mL/min   GFR, Est AFR Am 59 (L) >60 mL/min   Anion gap 13 5 - 15    Comment: Performed at [facility], [address] [place], [place] [address]  CBC with Differential ([place] Only)     Status: Abnormal   Collection Time: 07/07/19  8:55  AM  Result Value Ref Range   WBC Count 4.2 4.0 - 10.5 K/uL   RBC 3.63 (L) 3.87 - 5.11 MIL/uL   Hemoglobin 9.7 (L) 12.0 - 15.0 g/dL   HCT 30.9 (L) 36.0 - 46.0 %   MCV 85.1 80.0 - 100.0 fL   MCH 26.7 26.0 - 34.0 pg   MCHC 31.4 30.0 - 36.0 g/dL   RDW 16.5 (H) 11.5 - 15.5 %   Platelet Count 241 150 - 400 K/uL   nRBC 0.0 0.0 - 0.2 %   Neutrophils Relative % 62 %   Neutro Abs 2.6 1.7 - 7.7 K/uL   Lymphocytes Relative 14 %   Lymphs Abs 0.6 (L) 0.7 - 4.0 K/uL   Monocytes Relative 13 %   Monocytes Absolute 0.6 0.1 - 1.0 K/uL   Eosinophils Relative 9 %   Eosinophils Absolute 0.4 0.0 - 0.5 K/uL   Basophils Relative 1 %   Basophils Absolute 0.0 0.0 - 0.1 K/uL   Immature Granulocytes 1 %   Abs Immature Granulocytes 0.03 0.00 - 0.07 K/uL    Comment: Performed at [facility], [address], [place], [place] [address]  CMP ([place] only)     Status: Abnormal   Collection Time: 08/04/19  1:11 PM  Result Value Ref Range   Sodium 141 135 - 145 mmol/L   Potassium 3.5 3.5 - 5.1 mmol/L   Chloride 106 98 - 111 mmol/L    CO2 25 22 - 32 mmol/L   Glucose, Bld 103 (H) 70 - 99 mg/dL   BUN 26 (H) 8 - 23 mg/dL   Creatinine 1.32 (H) 0.44 - 1.00 mg/dL   Calcium 9.4 8.9 - 10.3 mg/dL   Total Protein 6.9 6.5 - 8.1 g/dL   Albumin 3.6 3.5 - 5.0 g/dL   AST 34 15 - 41 U/L   ALT 20 0 - 44 U/L   Alkaline Phosphatase 65 38 - 126 U/L   Total Bilirubin 0.4 0.3 - 1.2 mg/dL   GFR, Est Non Af Am 39 (L) >60 mL/min   GFR, Est AFR Am 45 (L) >60 mL/min   Anion gap 10 5 - 15    Comment: Performed at [facility], [address], [place],  [address]  CBC with Differential ([place] Only)     Status: Abnormal   Collection Time: 08/04/19  1:11 PM  Result Value Ref Range   WBC Count 5.3 4.0 - 10.5 K/uL   RBC 3.59 (L) 3.87 - 5.11 MIL/uL   Hemoglobin 9.5 (L) 12.0 - 15.0 g/dL   HCT 30.7 (L) 36.0 - 46.0 %   MCV 85.5 80.0 - 100.0 fL   MCH 26.5 26.0 - 34.0 pg   MCHC 30.9 30.0 - 36.0 g/dL   RDW 17.5 (H) 11.5 - 15.5 %   Platelet Count 179 150 - 400 K/uL   nRBC 0.0 0.0 - 0.2 %   Neutrophils Relative % 73 %   Neutro Abs 3.9 1.7 - 7.7 K/uL   Lymphocytes Relative 11 %   Lymphs Abs 0.6 (L) 0.7 - 4.0 K/uL   Monocytes Relative 11 %   Monocytes Absolute 0.6 0.1 - 1.0 K/uL   Eosinophils Relative 3 %   Eosinophils Absolute 0.2 0.0 - 0.5 K/uL   Basophils Relative 1 %   Basophils Absolute 0.0 0.0 - 0.1 K/uL   Immature Granulocytes 1 %   Abs Immature Granulocytes 0.03 0.00 - 0.07 K/uL  Comment: Performed at [facility], [place] [address], [place], [place] [address]  CBC with Differential     Status: Abnormal   Collection Time: 08/16/19 12:39 AM  Result Value Ref Range   WBC 9.1 4.0 - 10.5 K/uL   RBC 1.61 (L) 3.87 - 5.11 MIL/uL   Hemoglobin 4.2 (LL) 12.0 - 15.0 g/dL    Comment: REPEATED TO VERIFY THIS CRITICAL RESULT HAS VERIFIED AND BEEN CALLED TO ZACK,TEETER BY AISHA MOHAMED ON 09 21 2020 AT 0226, AND HAS BEEN READ BACK.     HCT 14.2 (L) 36.0 - 46.0 %   MCV 88.2 80.0 -  100.0 fL   MCH 26.1 26.0 - 34.0 pg   MCHC 29.6 (L) 30.0 - 36.0 g/dL   RDW 18.3 (H) 11.5 - 15.5 %   Platelets 230 150 - 400 K/uL   nRBC 0.5 (H) 0.0 - 0.2 %   Neutrophils Relative % 70 %   Neutro Abs 6.4 1.7 - 7.7 K/uL   Lymphocytes Relative 14 %   Lymphs Abs 1.3 0.7 - 4.0 K/uL   Monocytes Relative 13 %   Monocytes Absolute 1.2 (H) 0.1 - 1.0 K/uL   Eosinophils Relative 1 %   Eosinophils Absolute 0.1 0.0 - 0.5 K/uL   Basophils Relative 0 %   Basophils Absolute 0.0 0.0 - 0.1 K/uL   Immature Granulocytes 2 %   Abs Immature Granulocytes 0.14 (H) 0.00 - 0.07 K/uL    Comment: Performed at [facility], [place] [address], [place], [place] [address]  Comprehensive metabolic panel     Status: Abnormal   Collection Time: 08/16/19 12:39 AM  Result Value Ref Range   Sodium 142 135 - 145 mmol/L   Potassium 3.4 (L) 3.5 - 5.1 mmol/L   Chloride 107 98 - 111 mmol/L   CO2 23 22 - 32 mmol/L   Glucose, Bld 138 (H) 70 - 99 mg/dL   BUN 42 (H) 8 - 23 mg/dL   Creatinine, Ser 1.13 (H) 0.44 - 1.00 mg/dL   Calcium 8.7 (L) 8.9 - 10.3 mg/dL   Total Protein 5.4 (L) 6.5 - 8.1 g/dL   Albumin 3.0 (L) 3.5 - 5.0 g/dL   AST 27 15 - 41 U/L   ALT 18 0 - 44 U/L   Alkaline Phosphatase 37 (L) 38 - 126 U/L   Total Bilirubin 0.2 (L) 0.3 - 1.2 mg/dL   GFR calc non Af Amer 47 (L) >60 mL/min   GFR calc Af Amer 54 (L) >60 mL/min   Anion gap 12 5 - 15    Comment: Performed at [facility], [place] [address], [place], [place] [address]  Troponin I (High Sensitivity)     Status: Abnormal   Collection Time: 08/16/19 12:39 AM  Result Value Ref Range   Troponin I (High Sensitivity) 61 (H) <18 ng/L    Comment: (NOTE) Elevated high sensitivity troponin I (hsTnI) values and significant  changes across serial measurements may suggest ACS but many other  chronic and acute conditions are known to elevate hsTnI results.  Refer to the Links section for chest pain algorithms and additional   guidance. Performed at [facility], [place] [address] [place], [place] [address]   Protime-INR     Status: None   Collection Time: 08/16/19 12:39 AM  Result Value Ref Range   Prothrombin Time 13.5 11.4 - 15.2 seconds   INR 1.0 0.8 - 1.2    Comment: (  NOTE) INR goal varies based on device and disease states. Performed at [facility], [place] [address], [place], [place] [address]   Urinalysis, Routine w reflex microscopic     Status: Abnormal   Collection Time: 08/16/19 12:40 AM  Result Value Ref Range   Color, Urine YELLOW YELLOW   APPearance CLEAR CLEAR   Specific Gravity, Urine 1.015 1.005 - 1.030   pH 5.0 5.0 - 8.0   Glucose, UA NEGATIVE NEGATIVE mg/dL   Hgb urine dipstick NEGATIVE NEGATIVE   Bilirubin Urine NEGATIVE NEGATIVE   Ketones, ur NEGATIVE NEGATIVE mg/dL   Protein, ur NEGATIVE NEGATIVE mg/dL   Nitrite NEGATIVE NEGATIVE   Leukocytes,Ua SMALL (A) NEGATIVE   RBC / HPF 0-5 0 - 5 RBC/hpf   WBC, UA 11-20 0 - 5 WBC/hpf   Bacteria, UA RARE (A) NONE SEEN   Mucus PRESENT     Comment: Performed at [facility] [place] [address] [place], [place] [address]  Blood culture (routine x 2)     Status: None   Collection Time: 08/16/19 12:40 AM   Specimen: BLOOD RIGHT FOREARM  Result Value Ref Range   Specimen Description BLOOD RIGHT FOREARM    Special Requests      BOTTLES DRAWN AEROBIC AND ANAEROBIC Blood Culture adequate volume   Culture      NO GROWTH 5 DAYS Performed at [facility], [place] [address] [place], [place] [address]    Report Status 08/21/2019 FINAL   Urine culture     Status: Abnormal   Collection Time: 08/16/19 12:41 AM   Specimen: In/Out Cath Urine  Result Value Ref Range   Specimen Description      IN/OUT CATH URINE Performed at [facility], [place] [address] [place], [place] [address]    Special Requests      NONE Performed at [facility], [place] [address], [place], [place] [address]    Culture 1,000 COLONIES/mL PROTEUS MIRABILIS (A)    Report Status 08/18/2019 FINAL    Organism ID, Bacteria PROTEUS MIRABILIS (A)       Susceptibility   Proteus mirabilis - MIC*    AMPICILLIN <=2 SENSITIVE Sensitive     CEFAZOLIN <=4 SENSITIVE Sensitive     CEFTRIAXONE <=1 SENSITIVE Sensitive     CIPROFLOXACIN <=0.25 SENSITIVE Sensitive     GENTAMICIN <=1 SENSITIVE Sensitive     IMIPENEM 4 SENSITIVE Sensitive     NITROFURANTOIN 128 RESISTANT Resistant     TRIMETH/SULFA <=20 SENSITIVE Sensitive     AMPICILLIN/SULBACTAM <=2 SENSITIVE Sensitive     PIP/TAZO <=4 SENSITIVE Sensitive     * 1,000 COLONIES/mL PROTEUS MIRABILIS  Lactic acid, plasma     Status: None   Collection Time: 08/16/19 12:41 AM  Result Value Ref Range   Lactic Acid, Venous 1.9 0.5 - 1.9 mmol/L    Comment: Performed at [facility], [place] [address], [place], [place] [address]  SARS Coronavirus 2 [facility] order, Performed in [facility] hospital lab) Nasopharyngeal Nasopharyngeal Swab     Status: None   Collection Time: 08/16/19 12:43 AM   Specimen: Nasopharyngeal Swab  Result Value Ref Range   SARS Coronavirus 2 NEGATIVE NEGATIVE    Comment: (NOTE) If result is NEGATIVE SARS-CoV-2 target nucleic acids are NOT DETECTED. The SARS-CoV-2 RNA is generally detectable in upper and lower  respiratory specimens during the acute phase of infection. The lowest  concentration of SARS-CoV-2 viral copies this assay  can detect is 250  copies / mL. A negative result does not preclude SARS-CoV-2 infection  and should not be used as the sole basis for treatment or other  patient management decisions.  A negative result may occur with  improper specimen collection / handling, submission of specimen other  than nasopharyngeal swab, presence of viral mutation(s) within the  areas targeted by this assay, and inadequate number of viral copies  (<250 copies / mL). A negative result must be  combined with clinical  observations, patient history, and epidemiological information. If result is POSITIVE SARS-CoV-2 target nucleic acids are DETECTED. The SARS-CoV-2 RNA is generally detectable in upper and lower  respiratory specimens dur ing the acute phase of infection.  Positive  results are indicative of active infection with SARS-CoV-2.  Clinical  correlation with patient history and other diagnostic information is  necessary to determine patient infection status.  Positive results do  not rule out bacterial infection or co-infection with other viruses. If result is PRESUMPTIVE POSTIVE SARS-CoV-2 nucleic acids MAY BE PRESENT.   A presumptive positive result was obtained on the submitted specimen  and confirmed on repeat testing.  While 2019 novel coronavirus  (SARS-CoV-2) nucleic acids may be present in the submitted sample  additional confirmatory testing may be necessary for epidemiological  and / or clinical management purposes  to differentiate between  SARS-CoV-2 and other Sarbecovirus currently known to infect humans.  If clinically indicated additional testing with an alternate test  methodology [phone]) is advised. The SARS-CoV-2 RNA is generally  detectable in upper and lower respiratory sp ecimens during the acute  phase of infection. The expected result is Negative. Fact Sheet for Patients:  StrictlyIdeas.no Fact Sheet for Healthcare Providers: BankingDealers.co.za This test is not yet approved or cleared by the [place] FDA and has been authorized for detection and/or diagnosis of SARS-CoV-2 by FDA under an Emergency Use Authorization (EUA).  This EUA will remain in effect (meaning this test can be used) for the duration of the COVID-19 declaration under Section 564(b)(1) of the Act, 21 U.S.C. section 360bbb-3(b)(1), unless the authorization is terminated or revoked sooner. Performed at [facility], [place] [address], [place], [place] [address]   Blood culture (routine x 2)     Status: Abnormal   Collection Time: 08/16/19 12:45 AM   Specimen: BLOOD  Result Value Ref Range   Specimen Description BLOOD RIGHT ANTECUBITAL    Special Requests      BOTTLES DRAWN AEROBIC AND ANAEROBIC Blood Culture adequate volume   Culture  Setup Time      GRAM POSITIVE COCCI ANAEROBIC BOTTLE ONLY CRITICAL RESULT CALLED TO, READ BACK BY AND VERIFIED WITH: PHRMD J GRIMFLEY @0603  08/17/19 BY S GEZAHEGN    Culture (A)     STAPHYLOCOCCUS SPECIES (COAGULASE NEGATIVE) THE SIGNIFICANCE OF ISOLATING THIS ORGANISM FROM A SINGLE SET OF BLOOD CULTURES WHEN MULTIPLE SETS ARE DRAWN IS UNCERTAIN. PLEASE NOTIFY THE MICROBIOLOGY DEPARTMENT WITHIN ONE WEEK IF SPECIATION AND SENSITIVITIES ARE REQUIRED. Performed at [facility], [place] [address], [place], [place] [address]    Report Status 08/19/2019 FINAL   Blood Culture ID Panel (Reflexed)     Status: Abnormal   Collection Time: 08/16/19 12:45 AM  Result Value Ref Range   Enterococcus species NOT DETECTED NOT DETECTED   Listeria monocytogenes NOT DETECTED NOT DETECTED   Staphylococcus species DETECTED (A) NOT DETECTED    Comment: Methicillin (oxacillin) susceptible coagulase negative staphylococcus. Possible blood culture contaminant (  unless isolated from more than one blood culture draw or clinical case suggests pathogenicity). No antibiotic treatment is indicated for blood  culture contaminants. CRITICAL RESULT CALLED TO, READ BACK BY AND VERIFIED WITH: PHRMD J GRIMFLEY @0603  08/17/19 BY S GEZAHEGN    Staphylococcus aureus (BCID) NOT DETECTED NOT DETECTED   Methicillin resistance NOT DETECTED NOT DETECTED   Streptococcus species NOT DETECTED NOT DETECTED   Streptococcus agalactiae NOT DETECTED NOT DETECTED   Streptococcus pneumoniae NOT DETECTED NOT DETECTED   Streptococcus pyogenes NOT DETECTED NOT DETECTED   Acinetobacter baumannii NOT DETECTED NOT  DETECTED   Enterobacteriaceae species NOT DETECTED NOT DETECTED   Enterobacter cloacae complex NOT DETECTED NOT DETECTED   Escherichia coli NOT DETECTED NOT DETECTED   Klebsiella oxytoca NOT DETECTED NOT DETECTED   Klebsiella pneumoniae NOT DETECTED NOT DETECTED   Proteus species NOT DETECTED NOT DETECTED   Serratia marcescens NOT DETECTED NOT DETECTED   Haemophilus influenzae NOT DETECTED NOT DETECTED   Neisseria meningitidis NOT DETECTED NOT DETECTED   Pseudomonas aeruginosa NOT DETECTED NOT DETECTED   Candida albicans NOT DETECTED NOT DETECTED   Candida glabrata NOT DETECTED NOT DETECTED   Candida krusei NOT DETECTED NOT DETECTED   Candida parapsilosis NOT DETECTED NOT DETECTED   Candida tropicalis NOT DETECTED NOT DETECTED    Comment: Performed at [facility] [address] [place], [place] [address]  Type and screen     Status: None   Collection Time: 08/16/19  2:30 AM  Result Value Ref Range   ABO/RH(D) O POS    Antibody Screen NEG    Sample Expiration 08/19/2019,2359    Unit Number [id number]    Blood Component Type RED CELLS,LR    Unit division 00    Status of Unit ISSUED,FINAL    Unit tag comment EMERGENCY RELEASE DR WARD    Transfusion Status OK TO TRANSFUSE    Crossmatch Result      COMPATIBLE Performed at [place] [address], [place], [place] [address]    Unit Number [id number]    Blood Component Type RED CELLS,LR    Unit division 00    Status of Unit ISSUED,FINAL    Transfusion Status OK TO TRANSFUSE    Crossmatch Result Compatible    Unit Number [id number]    Blood Component Type RED CELLS,LR    Unit division 00    Status of Unit ISSUED,FINAL    Transfusion Status OK TO TRANSFUSE    Crossmatch Result Compatible    Unit Number [id number]    Blood Component Type RED CELLS,LR    Unit division 00    Status of Unit ISSUED,FINAL    Transfusion Status OK TO TRANSFUSE    Crossmatch Result Compatible    Unit  Number [id number]    Blood Component Type RED CELLS,LR    Unit division 00    Status of Unit ISSUED,FINAL    Transfusion Status OK TO TRANSFUSE    Crossmatch Result Compatible   Prepare RBC     Status: None   Collection Time: 08/16/19  2:30 AM  Result Value Ref Range   Order Confirmation      ORDER PROCESSED BY BLOOD BANK Performed at [facility] [address], [place], [place] [address]   BPAM RBC     Status: None   Collection Time: 08/16/19  2:30 AM  Result Value Ref Range   ISSUE DATE / TIME [id number]    Blood Product Unit Number  [id number]    Unit Type and Rh 9500    Blood Product Expiration Date 366294765465    ISSUING PHYSICIAN 6631^WARD^KRISTEN^N    ISSUE DATE / TIME 035465681275    Blood Product Unit Number [id number]    PRODUCT CODE [id number]    Unit Type and Rh 5100    Blood Product Expiration Date 659935701779    ISSUING PHYSICIAN [facility]    ISSUE DATE / TIME 390300923300    Blood Product Unit Number [id number]    PRODUCT CODE [id number]    Unit Type and Rh 5100    Blood Product Expiration Date 428768115726    ISSUING PHYSICIAN [facility]    ISSUE DATE / TIME 203559741638    Blood Product Unit Number [id number]    PRODUCT CODE [id number]    Unit Type and Rh 5100    Blood Product Expiration Date 202010212359    ISSUING PHYSICIAN [facility]    ISSUE DATE / TIME 048889169450    Blood Product Unit Number [id number]    PRODUCT CODE [id number]    Unit Type and Rh 5100    Blood Product Expiration Date 202010212359    ISSUING PHYSICIAN ^WARD^KRISTEN^N   ECHOCARDIOGRAM COMPLETE     Status: None   Collection Time: 08/16/19 10:01 AM  Result Value Ref Range   Weight 2,336 oz   Height 68 in   BP 120/55 mmHg  MRSA PCR Screening     Status: None   Collection Time: 08/16/19 10:44 AM   Specimen: Nasopharyngeal  Result Value Ref Range   MRSA by PCR NEGATIVE NEGATIVE    Comment:        The GeneXpert MRSA Assay (FDA  approved for NASAL specimens only), is one component of a comprehensive MRSA colonization surveillance program. It is not intended to diagnose MRSA infection nor to guide or monitor treatment for MRSA infections. Performed at [facility], [place] [address] [place], [place] [address]   CBC     Status: Abnormal   Collection Time: 08/16/19 11:16 AM  Result Value Ref Range   WBC 5.2 4.0 - 10.5 K/uL   RBC 2.69 (L) 3.87 - 5.11 MIL/uL   Hemoglobin 7.5 (L) 12.0 - 15.0 g/dL    Comment: REPEATED TO VERIFY POST TRANSFUSION SPECIMEN DELTA CHECK NOTED    HCT 23.8 (L) 36.0 - 46.0 %   MCV 88.5 80.0 - 100.0 fL   MCH 27.9 26.0 - 34.0 pg   MCHC 31.5 30.0 - 36.0 g/dL   RDW 16.9 (H) 11.5 - 15.5 %   Platelets 119 (L) 150 - 400 K/uL   nRBC 0.4 (H) 0.0 - 0.2 %    Comment: Performed at [facility], [place] [address], [place],  [address]  Comprehensive metabolic panel     Status: Abnormal   Collection Time: 08/16/19 11:16 AM  Result Value Ref Range   Sodium 139 135 - 145 mmol/L   Potassium 3.2 (L) 3.5 - 5.1 mmol/L   Chloride 113 (H) 98 - 111 mmol/L   CO2 20 (L) 22 - 32 mmol/L   Glucose, Bld 107 (H) 70 - 99 mg/dL   BUN 39 (H) 8 - 23 mg/dL   Creatinine, Ser 0.93 0.44 - 1.00 mg/dL   Calcium 7.4 (L) 8.9 - 10.3 mg/dL   Total Protein 4.6 (L) 6.5 - 8.1 g/dL   Albumin 2.5 (L) 3.5 - 5.0 g/dL   AST 24 15 - 41 U/L   ALT 17 0 - 44 U/L  Alkaline Phosphatase 30 (L) 38 - 126 U/L   Total Bilirubin 0.5 0.3 - 1.2 mg/dL   GFR calc non Af Amer 59 (L) >60 mL/min   GFR calc Af Amer >60 >60 mL/min   Anion gap 6 5 - 15    Comment: Performed at [facility], [place] [address], [place], [place] [address]  Troponin I (High Sensitivity)     Status: Abnormal   Collection Time: 08/16/19 11:16 AM  Result Value Ref Range   Troponin I (High Sensitivity) 561 (HH) <18 ng/L    Comment: CRITICAL RESULT CALLED TO, READ BACK BY AND VERIFIED WITH: [place]  [id number] @ 1884 BY J SCOTTON (NOTE) Elevated high sensitivity troponin I (hsTnI) values and significant  changes across serial measurements may suggest ACS but many other  chronic and acute conditions are known to elevate hsTnI results.  Refer to the Links section for chest pain algorithms and additional  guidance. Performed at [facility], [place] [address] [place], [place] [address]   Troponin I (High Sensitivity)     Status: Abnormal   Collection Time: 08/16/19 12:29 PM  Result Value Ref Range   Troponin I (High Sensitivity) 653 (HH) <18 ng/L    Comment: CRITICAL VALUE NOTED.  VALUE IS CONSISTENT WITH PREVIOUSLY REPORTED AND CALLED VALUE. (NOTE) Elevated high sensitivity troponin I (hsTnI) values and significant  changes across serial measurements may suggest ACS but many other  chronic and acute conditions are known to elevate hsTnI results.  Refer to the Links section for chest pain algorithms and additional  guidance. Performed at [facility], [place] [address] [place], [place] [address]   Prepare RBC     Status: None   Collection Time: 08/16/19 12:29 PM  Result Value Ref Range   Order Confirmation      ORDER PROCESSED BY BLOOD BANK Performed at [facility], [place] [address], [place], [place] [address]   Troponin I (High Sensitivity)     Status: Abnormal   Collection Time: 08/16/19  4:59 PM  Result Value Ref Range   Troponin I (High Sensitivity) 626 (HH) <18 ng/L    Comment: CRITICAL VALUE NOTED.  VALUE IS CONSISTENT WITH PREVIOUSLY REPORTED AND CALLED VALUE. (NOTE) Elevated high sensitivity troponin I (hsTnI) values and significant  changes across serial measurements may suggest ACS but many other  chronic and acute conditions are known to elevate hsTnI results.  Refer to the Links section for chest pain algorithms and additional  guidance. Performed at [facility], [place] [address],  [place], [place] [address]   Basic metabolic panel     Status: Abnormal   Collection Time: 08/16/19  9:04 PM  Result Value Ref Range   Sodium 139 135 - 145 mmol/L   Potassium 3.6 3.5 - 5.1 mmol/L   Chloride 111 98 - 111 mmol/L   CO2 21 (L) 22 - 32 mmol/L   Glucose, Bld 98 70 - 99 mg/dL   BUN 36 (H) 8 - 23 mg/dL   Creatinine, Ser 1.01 (H) 0.44 - 1.00 mg/dL   Calcium 7.7 (L) 8.9 - 10.3 mg/dL   GFR calc non Af Amer 53 (L) >60 mL/min   GFR calc Af Amer >60 >60 mL/min   Anion gap 7 5 - 15    Comment: Performed at [facility], [place] [address] [place], [place] [address]  CBC with Differential/Platelet     Status: Abnormal   Collection  Time: 08/16/19 10:56 PM  Result Value Ref Range   WBC 5.6 4.0 - 10.5 K/uL   RBC 3.49 (L) 3.87 - 5.11 MIL/uL   Hemoglobin 10.2 (L) 12.0 - 15.0 g/dL    Comment: REPEATED TO VERIFY POST TRANSFUSION SPECIMEN DELTA CHECK NOTED    HCT 30.9 (L) 36.0 - 46.0 %   MCV 88.5 80.0 - 100.0 fL   MCH 29.2 26.0 - 34.0 pg   MCHC 33.0 30.0 - 36.0 g/dL   RDW 15.9 (H) 11.5 - 15.5 %   Platelets 113 (L) 150 - 400 K/uL    Comment: REPEATED TO VERIFY Immature Platelet Fraction may be clinically indicated, consider ordering this additional test [id number] CONSISTENT WITH PREVIOUS RESULT    nRBC 1.2 (H) 0.0 - 0.2 %   Neutrophils Relative % 63 %   Neutro Abs 3.6 1.7 - 7.7 K/uL   Lymphocytes Relative 16 %   Lymphs Abs 0.9 0.7 - 4.0 K/uL   Monocytes Relative 14 %   Monocytes Absolute 0.8 0.1 - 1.0 K/uL   Eosinophils Relative 3 %   Eosinophils Absolute 0.2 0.0 - 0.5 K/uL   Basophils Relative 1 %   Basophils Absolute 0.1 0.0 - 0.1 K/uL   Immature Granulocytes 3 %   Abs Immature Granulocytes 0.15 (H) 0.00 - 0.07 K/uL    Comment: Performed at [facility], [place] [address] [place], [place] [address]  CBC with Differential/Platelet     Status: Abnormal   Collection Time: 08/17/19  9:18 AM  Result Value Ref Range   WBC 6.4 4.0 - 10.5 K/uL    RBC 3.61 (L) 3.87 - 5.11 MIL/uL   Hemoglobin 10.5 (L) 12.0 - 15.0 g/dL   HCT 33.0 (L) 36.0 - 46.0 %   MCV 91.4 80.0 - 100.0 fL   MCH 29.1 26.0 - 34.0 pg   MCHC 31.8 30.0 - 36.0 g/dL   RDW 16.5 (H) 11.5 - 15.5 %   Platelets 128 (L) 150 - 400 K/uL    Comment: Immature Platelet Fraction may be clinically indicated, consider ordering this additional test [id number]    nRBC 0.9 (H) 0.0 - 0.2 %   Neutrophils Relative % 71 %   Neutro Abs 4.6 1.7 - 7.7 K/uL   Lymphocytes Relative 12 %   Lymphs Abs 0.8 0.7 - 4.0 K/uL   Monocytes Relative 11 %   Monocytes Absolute 0.7 0.1 - 1.0 K/uL   Eosinophils Relative 3 %   Eosinophils Absolute 0.2 0.0 - 0.5 K/uL   Basophils Relative 1 %   Basophils Absolute 0.1 0.0 - 0.1 K/uL   Immature Granulocytes 2 %   Abs Immature Granulocytes 0.14 (H) 0.00 - 0.07 K/uL    Comment: Performed at [facility], [place] [address], [place], [place] [address]  Basic metabolic panel     Status: Abnormal   Collection Time: 08/17/19  9:18 AM  Result Value Ref Range   Sodium 138 135 - 145 mmol/L   Potassium 3.4 (L) 3.5 - 5.1 mmol/L   Chloride 111 98 - 111 mmol/L   CO2 20 (L) 22 - 32 mmol/L   Glucose, Bld 101 (H) 70 - 99 mg/dL   BUN 32 (H) 8 - 23 mg/dL   Creatinine, Ser 0.91 0.44 - 1.00 mg/dL   Calcium 7.9 (L) 8.9 - 10.3 mg/dL   GFR calc non Af Amer >60 >60 mL/min   GFR calc Af Amer >60 >60 mL/min   Anion gap 7  5 - 15    Comment: Performed at [facility], [place] [address], [place], [place] [address]  Troponin I (High Sensitivity)     Status: Abnormal   Collection Time: 08/17/19  9:18 AM  Result Value Ref Range   Troponin I (High Sensitivity) 262 (HH) <18 ng/L    Comment: DELTA CHECK NOTED CRITICAL VALUE NOTED.  VALUE IS CONSISTENT WITH PREVIOUSLY REPORTED AND CALLED VALUE. (NOTE) Elevated high sensitivity troponin I (hsTnI) values and significant  changes across serial measurements may suggest ACS but many other  chronic and acute  conditions are known to elevate hsTnI results.  Refer to the Links section for chest pain algorithms and additional  guidance. Performed at [facility], [place] [address] [place], [place] [address]   Troponin I (High Sensitivity)     Status: Abnormal   Collection Time: 08/17/19 11:00 AM  Result Value Ref Range   Troponin I (High Sensitivity) 229 (HH) <18 ng/L    Comment: DELTA CHECK NOTED CRITICAL VALUE NOTED.  VALUE IS CONSISTENT WITH PREVIOUSLY REPORTED AND CALLED VALUE. (NOTE) Elevated high sensitivity troponin I (hsTnI) values and significant  changes across serial measurements may suggest ACS but many other  chronic and acute conditions are known to elevate hsTnI results.  Refer to the Links section for chest pain algorithms and additional  guidance. Performed at [facility], [place] [address], [place], [place] [address]   Hemoglobin and hematocrit, blood     Status: Abnormal   Collection Time: 08/17/19  3:08 PM  Result Value Ref Range   Hemoglobin 10.6 (L) 12.0 - 15.0 g/dL   HCT 32.7 (L) 36.0 - 46.0 %    Comment: Performed at [facility], [place] [address], [place], [place] [address]  CBC     Status: Abnormal   Collection Time: 08/18/19  5:55 AM  Result Value Ref Range   WBC 4.1 4.0 - 10.5 K/uL   RBC 3.06 (L) 3.87 - 5.11 MIL/uL   Hemoglobin 9.0 (L) 12.0 - 15.0 g/dL   HCT 28.0 (L) 36.0 - 46.0 %   MCV 91.5 80.0 - 100.0 fL   MCH 29.4 26.0 - 34.0 pg   MCHC 32.1 30.0 - 36.0 g/dL   RDW 17.1 (H) 11.5 - 15.5 %   Platelets 120 (L) 150 - 400 K/uL   nRBC 1.0 (H) 0.0 - 0.2 %    Comment: Performed at [facility], [place] [address], [place], [place] [address]  Basic metabolic panel     Status: Abnormal   Collection Time: 08/18/19  5:55 AM  Result Value Ref Range   Sodium 141 135 - 145 mmol/L   Potassium 2.9 (L) 3.5 - 5.1 mmol/L   Chloride 113 (H) 98 - 111 mmol/L   CO2 19 (L) 22 - 32 mmol/L   Glucose, Bld 93  70 - 99 mg/dL   BUN 26 (H) 8 - 23 mg/dL   Creatinine, Ser 0.91 0.44 - 1.00 mg/dL   Calcium 7.7 (L) 8.9 - 10.3 mg/dL   GFR calc non Af Amer >60 >60 mL/min   GFR calc Af Amer >60 >60 mL/min   Anion gap 9 5 - 15    Comment: Performed at [facility], [place] [address], [place], [place] [address]  CBC     Status: Abnormal   Collection Time: 08/19/19  8:36 AM  Result Value Ref Range   WBC 3.3 (L) 4.0 - 10.5 K/uL  RBC 2.71 (L) 3.87 - 5.11 MIL/uL   Hemoglobin 8.0 (L) 12.0 - 15.0 g/dL   HCT 25.3 (L) 36.0 - 46.0 %   MCV 93.4 80.0 - 100.0 fL   MCH 29.5 26.0 - 34.0 pg   MCHC 31.6 30.0 - 36.0 g/dL   RDW 17.6 (H) 11.5 - 15.5 %   Platelets 126 (L) 150 - 400 K/uL    Comment: Immature Platelet Fraction may be clinically indicated, consider ordering this additional test [id number]    nRBC 0.6 (H) 0.0 - 0.2 %    Comment: Performed at [facility], [place] [address], [place], [place] [address]  Basic metabolic panel     Status: Abnormal   Collection Time: 08/19/19  8:36 AM  Result Value Ref Range   Sodium 139 135 - 145 mmol/L   Potassium 3.5 3.5 - 5.1 mmol/L    Comment: DELTA CHECK NOTED REPEATED TO VERIFY NO VISIBLE HEMOLYSIS    Chloride 112 (H) 98 - 111 mmol/L   CO2 19 (L) 22 - 32 mmol/L   Glucose, Bld 87 70 - 99 mg/dL   BUN 18 8 - 23 mg/dL   Creatinine, Ser 0.88 0.44 - 1.00 mg/dL   Calcium 7.4 (L) 8.9 - 10.3 mg/dL   GFR calc non Af Amer >60 >60 mL/min   GFR calc Af Amer >60 >60 mL/min   Anion gap 8 5 - 15    Comment: Performed at [facility], [place] [address] [place], [place] [address]  Magnesium     Status: None   Collection Time: 08/19/19  8:36 AM  Result Value Ref Range   Magnesium 1.8 1.7 - 2.4 mg/dL    Comment: Performed at [facility], [place] [address], [place], [place] [address]  CBC Once     Status: Abnormal   Collection Time: 08/19/19  2:20 PM  Result Value Ref Range   WBC 3.8 (L) 4.0 - 10.5 K/uL   RBC  2.80 (L) 3.87 - 5.11 MIL/uL   Hemoglobin 8.2 (L) 12.0 - 15.0 g/dL   HCT 26.3 (L) 36.0 - 46.0 %   MCV 93.9 80.0 - 100.0 fL   MCH 29.3 26.0 - 34.0 pg   MCHC 31.2 30.0 - 36.0 g/dL   RDW 17.6 (H) 11.5 - 15.5 %   Platelets 138 (L) 150 - 400 K/uL   nRBC 0.5 (H) 0.0 - 0.2 %    Comment: Performed at [facility], [place] [address], [place], [place] [address]  Protime-INR Once     Status: None   Collection Time: 08/19/19  2:20 PM  Result Value Ref Range   Prothrombin Time 13.7 11.4 - 15.2 seconds   INR 1.1 0.8 - 1.2    Comment: (NOTE) INR goal varies based on device and disease states. Performed at [facility], [place] [address], [place], [place] [address]   CBC Once     Status: Abnormal   Collection Time: 08/19/19  7:48 PM  Result Value Ref Range   WBC 3.5 (L) 4.0 - 10.5 K/uL   RBC 2.71 (L) 3.87 - 5.11 MIL/uL   Hemoglobin 7.9 (L) 12.0 - 15.0 g/dL   HCT 25.6 (L) 36.0 - 46.0 %   MCV 94.5 80.0 - 100.0 fL   MCH 29.2 26.0 - 34.0 pg   MCHC 30.9 30.0 - 36.0 g/dL   RDW 17.8 (H) 11.5 - 15.5 %   Platelets 126 (L) 150 - 400 K/uL  nRBC 0.6 (H) 0.0 - 0.2 %    Comment: Performed at [facility], [place] [address], [place], [place] [address]  Protime-INR Once     Status: None   Collection Time: 08/20/19  4:24 AM  Result Value Ref Range   Prothrombin Time 15.2 11.4 - 15.2 seconds   INR 1.2 0.8 - 1.2    Comment: (NOTE) INR goal varies based on device and disease states. Performed at [facility], [place] [address], [place], [place] [address]   Basic metabolic panel     Status: Abnormal   Collection Time: 08/20/19  4:24 AM  Result Value Ref Range   Sodium 140 135 - 145 mmol/L   Potassium 3.3 (L) 3.5 - 5.1 mmol/L   Chloride 113 (H) 98 - 111 mmol/L   CO2 20 (L) 22 - 32 mmol/L   Glucose, Bld 92 70 - 99 mg/dL   BUN 13 8 - 23 mg/dL   Creatinine, Ser 0.80 0.44 - 1.00 mg/dL   Calcium 7.5 (L) 8.9 - 10.3 mg/dL   GFR calc non Af  Amer >60 >60 mL/min   GFR calc Af Amer >60 >60 mL/min   Anion gap 7 5 - 15    Comment: Performed at [facility], [place] [address] [place], [place] [address]  CBC     Status: Abnormal   Collection Time: 08/20/19  7:52 AM  Result Value Ref Range   WBC 3.1 (L) 4.0 - 10.5 K/uL   RBC 2.58 (L) 3.87 - 5.11 MIL/uL   Hemoglobin 7.5 (L) 12.0 - 15.0 g/dL   HCT 24.3 (L) 36.0 - 46.0 %   MCV 94.2 80.0 - 100.0 fL   MCH 29.1 26.0 - 34.0 pg   MCHC 30.9 30.0 - 36.0 g/dL   RDW 17.5 (H) 11.5 - 15.5 %   Platelets 124 (L) 150 - 400 K/uL   nRBC 0.0 0.0 - 0.2 %    Comment: Performed at [facility], [place] [address] [place], [place] [address]  Type and screen     Status: None   Collection Time: 08/20/19  9:36 AM  Result Value Ref Range   ABO/RH(D) O POS    Antibody Screen NEG    Sample Expiration 08/23/2019,2359    Unit Number [id number]    Blood Component Type RED CELLS,LR    Unit division 00    Status of Unit ISSUED,FINAL    Transfusion Status OK TO TRANSFUSE    Crossmatch Result      Compatible Performed at [place] [address] [place], [place] [address]   BPAM RBC     Status: None   Collection Time: 08/20/19  9:36 AM  Result Value Ref Range   ISSUE DATE / TIME [id number]    Blood Product Unit Number [id number]    PRODUCT CODE [id number]    Unit Type and Rh 5100    Blood Product Expiration Date 423536144315   Prepare RBC     Status: None   Collection Time: 08/20/19  9:46 AM  Result Value Ref Range   Order Confirmation      ORDER PROCESSED BY BLOOD BANK Performed at [facility], [place] [address] [place], [place] [address]   Basic metabolic panel     Status: Abnormal   Collection Time: 08/21/19  4:23 AM  Result Value Ref Range   Sodium 139 135 - 145 mmol/L   Potassium 3.7 3.5 - 5.1  mmol/L   Chloride 111 98 - 111 mmol/L   CO2 19 (L) 22 - 32 mmol/L   Glucose, Bld 77 70 - 99 mg/dL   BUN 8 8 - 23  mg/dL   Creatinine, Ser 0.85 0.44 - 1.00 mg/dL   Calcium 7.5 (L) 8.9 - 10.3 mg/dL   GFR calc non Af Amer >60 >60 mL/min   GFR calc Af Amer >60 >60 mL/min   Anion gap 9 5 - 15    Comment: Performed at [facility], [place] [address] [place], [place] [address]  CBC     Status: Abnormal   Collection Time: 08/21/19  4:23 AM  Result Value Ref Range   WBC 3.7 (L) 4.0 - 10.5 K/uL   RBC 3.28 (L) 3.87 - 5.11 MIL/uL   Hemoglobin 9.3 (L) 12.0 - 15.0 g/dL   HCT 30.5 (L) 36.0 - 46.0 %   MCV 93.0 80.0 - 100.0 fL   MCH 28.4 26.0 - 34.0 pg   MCHC 30.5 30.0 - 36.0 g/dL   RDW 17.0 (H) 11.5 - 15.5 %   Platelets 138 (L) 150 - 400 K/uL   nRBC 0.0 0.0 - 0.2 %    Comment: Performed at [facility], [place] [address], [place], [place] [address]  Magnesium     Status: None   Collection Time: 08/21/19  4:23 AM  Result Value Ref Range   Magnesium 1.9 1.7 - 2.4 mg/dL    Comment: Performed at [facility], [place] [address], [place], [place] [address]  Basic metabolic panel     Status: Abnormal   Collection Time: 08/22/19  3:36 AM  Result Value Ref Range   Sodium 138 135 - 145 mmol/L   Potassium 3.4 (L) 3.5 - 5.1 mmol/L   Chloride 111 98 - 111 mmol/L   CO2 19 (L) 22 - 32 mmol/L   Glucose, Bld 94 70 - 99 mg/dL   BUN 6 (L) 8 - 23 mg/dL   Creatinine, Ser 0.77 0.44 - 1.00 mg/dL   Calcium 7.6 (L) 8.9 - 10.3 mg/dL   GFR calc non Af Amer >60 >60 mL/min   GFR calc Af Amer >60 >60 mL/min   Anion gap 8 5 - 15    Comment: Performed at [name], [place] [address], [place], [place] [address]  CBC     Status: Abnormal   Collection Time: 08/22/19  3:36 AM  Result Value Ref Range   WBC 3.2 (L) 4.0 - 10.5 K/uL   RBC 3.40 (L) 3.87 - 5.11 MIL/uL   Hemoglobin 9.8 (L) 12.0 - 15.0 g/dL   HCT 31.4 (L) 36.0 - 46.0 %   MCV 92.4 80.0 - 100.0 fL   MCH 28.8 26.0 - 34.0 pg   MCHC 31.2 30.0 - 36.0 g/dL   RDW 16.7 (H) 11.5 - 15.5 %   Platelets 146 (L) 150  - 400 K/uL   nRBC 0.0 0.0 - 0.2 %    Comment: Performed at [facility], [place] [address] [place], [place] [address]    Assessment/Plan: 1. Acute blood loss anemia S/p transfusion of 5 units pRBCs and dose of IV feraheme. Is taking her iron supplement daily. BP normalized. Asymptomatic at present. Repeat labs today. - CBC w/Diff - Basic metabolic panel  2. Hypotension due to hypovolemia BP normotensive. Asymptomatic. Recheck labs today.  3. Elevated troponin 2/2 demand ischemia from severe anemia. BP normotensive. No chest pain, SOB, LH  or dizziness. Repeat labs today  4. Hypokalemia Noted during admission. Repeat BMP today. - Basic metabolic panel  5. Need for immunization against influenza - Flu Vaccine QUAD High Dose(Fluad)   Leeanne Rio, [name]

## 2019-08-30 NOTE — Patient Instructions (Signed)
Please keep well-hydrated and get plenty of rest. Keep an eye on appetite -- let me know if this is not continuing to improve.  Finish entire course of antibiotics.  Continue Omeprazole as directed by Gastroenterology.  Continue chronic medications as directed.  If you note any recurrence of symptoms, let us know ASAP.  Hang in there!

## 2019-08-31 LAB — BASIC METABOLIC PANEL
BUN: 12 mg/dL (ref 6–23)
CO2: 28 mEq/L (ref 19–32)
Calcium: 8.8 mg/dL (ref 8.4–10.5)
Chloride: 102 mEq/L (ref 96–112)
Creatinine, Ser: 1 mg/dL (ref 0.40–1.20)
GFR: 53.6 mL/min — ABNORMAL LOW (ref >60.00)
Glucose, Bld: 105 mg/dL — ABNORMAL HIGH (ref 70–99)
Potassium: 4.3 mEq/L (ref 3.5–5.1)
Sodium: 139 mEq/L (ref 135–145)

## 2019-08-31 LAB — CBC WITH DIFFERENTIAL/PLATELET
Basophils Absolute: 0 10*3/uL (ref 0.0–0.1)
Basophils Relative: 0.2 % (ref 0.0–3.0)
Eosinophils Absolute: 0 10*3/uL (ref 0.0–0.7)
Eosinophils Relative: 0.4 % (ref 0.0–5.0)
HCT: 39.5 % (ref 36.0–46.0)
Hemoglobin: 12.7 g/dL (ref 12.0–15.0)
Lymphocytes Relative: 6.7 % — ABNORMAL LOW (ref 12.0–46.0)
Lymphs Abs: 0.5 10*3/uL — ABNORMAL LOW (ref 0.7–4.0)
MCHC: 32 g/dL (ref 30.0–36.0)
MCV: 89.3 fl (ref 78.0–100.0)
Monocytes Absolute: 0.6 10*3/uL (ref 0.1–1.0)
Monocytes Relative: 8.9 % (ref 3.0–12.0)
Neutro Abs: 6.1 10*3/uL (ref 1.4–7.7)
Neutrophils Relative %: 83.8 % — ABNORMAL HIGH (ref 43.0–77.0)
Platelets: 355 10*3/uL (ref 150.0–400.0)
RBC: 4.42 Mil/uL (ref 3.87–5.11)
RDW: 17.9 % — ABNORMAL HIGH (ref 11.5–15.5)
WBC: 7.2 10*3/uL (ref 4.0–10.5)

## 2019-09-08 ENCOUNTER — Inpatient Hospital Stay: Payer: Medicare Other | Attending: Hematology and Oncology

## 2019-09-08 ENCOUNTER — Ambulatory Visit (HOSPITAL_COMMUNITY)
Admission: RE | Admit: 2019-09-08 | Discharge: 2019-09-08 | Disposition: A | Discharge status: Home / Self Care | Payer: Medicare Other | Source: Ambulatory Visit | Attending: Hematology and Oncology | Admitting: Hematology and Oncology

## 2019-09-08 ENCOUNTER — Encounter (HOSPITAL_COMMUNITY): Payer: Self-pay

## 2019-09-08 ENCOUNTER — Other Ambulatory Visit: Payer: Self-pay

## 2019-09-08 DIAGNOSIS — C787 Secondary malignant neoplasm of liver and intrahepatic bile duct: Secondary | ICD-10-CM | POA: Insufficient documentation

## 2019-09-08 DIAGNOSIS — Z170 Estrogen receptor positive status [ER+]: Secondary | ICD-10-CM | POA: Diagnosis not present

## 2019-09-08 DIAGNOSIS — C7951 Secondary malignant neoplasm of bone: Secondary | ICD-10-CM | POA: Insufficient documentation

## 2019-09-08 DIAGNOSIS — C7802 Secondary malignant neoplasm of left lung: Secondary | ICD-10-CM | POA: Diagnosis not present

## 2019-09-08 DIAGNOSIS — C779 Secondary and unspecified malignant neoplasm of lymph node, unspecified: Secondary | ICD-10-CM | POA: Diagnosis not present

## 2019-09-08 DIAGNOSIS — J910 Malignant pleural effusion: Secondary | ICD-10-CM | POA: Diagnosis not present

## 2019-09-08 DIAGNOSIS — I10 Essential (primary) hypertension: Secondary | ICD-10-CM | POA: Diagnosis not present

## 2019-09-08 DIAGNOSIS — Z79811 Long term (current) use of aromatase inhibitors: Secondary | ICD-10-CM | POA: Diagnosis not present

## 2019-09-08 DIAGNOSIS — C7800 Secondary malignant neoplasm of unspecified lung: Secondary | ICD-10-CM | POA: Insufficient documentation

## 2019-09-08 DIAGNOSIS — C50412 Malignant neoplasm of upper-outer quadrant of left female breast: Secondary | ICD-10-CM | POA: Diagnosis not present

## 2019-09-08 DIAGNOSIS — C50919 Malignant neoplasm of unspecified site of unspecified female breast: Secondary | ICD-10-CM | POA: Diagnosis not present

## 2019-09-08 DIAGNOSIS — Z923 Personal history of irradiation: Secondary | ICD-10-CM | POA: Insufficient documentation

## 2019-09-08 DIAGNOSIS — C7801 Secondary malignant neoplasm of right lung: Secondary | ICD-10-CM | POA: Insufficient documentation

## 2019-09-08 DIAGNOSIS — E785 Hyperlipidemia, unspecified: Secondary | ICD-10-CM | POA: Insufficient documentation

## 2019-09-08 DIAGNOSIS — Z79899 Other long term (current) drug therapy: Secondary | ICD-10-CM | POA: Insufficient documentation

## 2019-09-08 LAB — CMP (CANCER CENTER ONLY)
ALT: 15 U/L (ref 0–44)
AST: 33 U/L (ref 15–41)
Albumin: 3.5 g/dL (ref 3.5–5.0)
Alkaline Phosphatase: 104 U/L (ref 38–126)
Anion gap: 14 (ref 5–15)
BUN: 16 mg/dL (ref 8–23)
CO2: 26 mmol/L (ref 22–32)
Calcium: 9.3 mg/dL (ref 8.9–10.3)
Chloride: 100 mmol/L (ref 98–111)
Creatinine: 1.14 mg/dL — ABNORMAL HIGH (ref 0.44–1.00)
GFR, Est AFR Am: 53 mL/min — ABNORMAL LOW (ref >60)
GFR, Estimated: 46 mL/min — ABNORMAL LOW (ref >60)
Glucose, Bld: 93 mg/dL (ref 70–99)
Potassium: 3.8 mmol/L (ref 3.5–5.1)
Sodium: 140 mmol/L (ref 135–145)
Total Bilirubin: 0.4 mg/dL (ref 0.3–1.2)
Total Protein: 7.4 g/dL (ref 6.5–8.1)

## 2019-09-08 LAB — CBC WITH DIFFERENTIAL (CANCER CENTER ONLY)
Abs Immature Granulocytes: 0.03 10*3/uL (ref 0.00–0.07)
Basophils Absolute: 0 10*3/uL (ref 0.0–0.1)
Basophils Relative: 1 %
Eosinophils Absolute: 0.1 10*3/uL (ref 0.0–0.5)
Eosinophils Relative: 2 %
HCT: 40.1 % (ref 36.0–46.0)
Hemoglobin: 12.7 g/dL (ref 12.0–15.0)
Immature Granulocytes: 1 %
Lymphocytes Relative: 15 %
Lymphs Abs: 0.7 10*3/uL (ref 0.7–4.0)
MCH: 28.7 pg (ref 26.0–34.0)
MCHC: 31.7 g/dL (ref 30.0–36.0)
MCV: 90.5 fL (ref 80.0–100.0)
Monocytes Absolute: 0.4 10*3/uL (ref 0.1–1.0)
Monocytes Relative: 9 %
Neutro Abs: 3.5 10*3/uL (ref 1.7–7.7)
Neutrophils Relative %: 72 %
Platelet Count: 201 10*3/uL (ref 150–400)
RBC: 4.43 MIL/uL (ref 3.87–5.11)
RDW: 16 % — ABNORMAL HIGH (ref 11.5–15.5)
WBC Count: 4.8 10*3/uL (ref 4.0–10.5)
nRBC: 0 % (ref 0.0–0.2)

## 2019-09-08 MED ORDER — SODIUM CHLORIDE (PF) 0.9 % IJ SOLN
INTRAMUSCULAR | Status: AC
  Filled 2019-09-08: qty 50

## 2019-09-08 MED ORDER — IOHEXOL 300 MG/ML  SOLN
100.0000 mL | Freq: Once | INTRAMUSCULAR | Status: AC | PRN
  Administered 2019-09-08: 11:00:00 100 mL via INTRAVENOUS

## 2019-09-08 MED ORDER — IOHEXOL 300 MG/ML  SOLN
30.0000 mL | Freq: Once | INTRAMUSCULAR | Status: AC | PRN
  Administered 2019-09-08: 30 mL via ORAL

## 2019-09-08 NOTE — Progress Notes (Signed)
Patient Care Team: Delorse Limber as PCP - General (Family Medicine)  DIAGNOSIS:    ICD-10-CM   1. Malignant neoplasm of upper-outer quadrant of left breast in female, estrogen receptor positive ([place])  C50.412    Z17.0     SUMMARY OF ONCOLOGIC HISTORY: Oncology History  Malignant neoplasm of upper-outer quadrant of left breast in female, estrogen receptor positive ([place])  07/28/2013 Mammogram   Spiculated mass at 6:00 position 3 cm, ultrasound revealed 2.6 x 2.1 x 1.5 cm second mass at 12:00 2.1 x 1.8 x 2.1 cm: 2 abnormal lymph nodes left axilla   10/29/2013 Initial Diagnosis   Breast cancer of upper-outer quadrant of left female breast: 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 [name], T3N2 (5/10 LN positive) stage IIIa IDC grade 1, ER/PR 100%, TSH 14% HER-2 negative PET/CT 12/20/2013 negative for metastases   12/17/2013 - 04/02/2014 Chemotherapy   Taxotere Cytoxan every 3 weeks for 6 cycles   05/11/2014 - 06/27/2014 Radiation Therapy   Adjuvant radiation therapy to chest wall and lymph nodes   07/06/2014 -  Anti-estrogen oral therapy   Arimidex 1 mg daily   12/21/2017 Imaging   Metastatic lesions noted in the lungs bilaterally, bilateral hilar and mediastinal lymph nodes, left pleural effusion, multiple thoracic vertebral metastases, heterogeneous lesions in the liver   12/22/2017 Procedure   Pleural Effusion Thoracentesis: Metastatic Breast cancer ER PR Positive Her 2 Neg   12/29/2017 Treatment Plan Change   Ibrance, Faslodex and Xgeva   03/09/2019 Treatment Plan Change   Everolimus and Exemestane   06/25/2019 Pathology Results   Liver biopsy: 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, ER 100%, PR 0%, Ki-67 10%, HER-2 -1+ by IHC     CHIEF COMPLIANT: Follow-up of metastatic breast cancer to review scans   INTERVAL HISTORY: Wendy [name] is a 78 y.o. with  above-mentioned history of metastatic breast cancer currently on treatment witheverolimus and exemestane and Xgeva. She was admitted from 9/21-9/27 for anemia and an upper GI bleed. CT CAP on 09/08/19 showed stable left pleural effusion and mediastinal lymph nodes, stable to slight progression of hepatic metastases, and stable osseous metastases. She presents to the clinic today to review her scans and discuss further treatment.   REVIEW OF SYSTEMS:   Constitutional: Denies fevers, chills or abnormal weight loss Eyes: Denies blurriness of vision Ears, nose, mouth, throat, and face: Denies mucositis or sore throat Respiratory: Denies cough, dyspnea or wheezes Cardiovascular: Denies palpitation, chest discomfort Gastrointestinal: Denies nausea, heartburn or change in bowel habits Skin: Denies abnormal skin rashes Lymphatics: Denies new lymphadenopathy or easy bruising Neurological: Denies numbness, tingling or new weaknesses Behavioral/Psych: Mood is stable, no new changes  Extremities: No lower extremity edema Breast: denies any pain or lumps or nodules in either breasts All other systems were reviewed with the patient and are negative.  I have reviewed the past medical history, past surgical history, social history and family history with the patient and they are unchanged from previous note.  ALLERGIES:  has No Known Allergies.  MEDICATIONS:  Current Outpatient Medications  Medication Sig Dispense Refill  . acetaminophen (TYLENOL) 500 MG tablet Take 1,000 mg by mouth every 6 (six) hours as needed for mild pain.    [name] AFINITOR 10 MG tablet TAKE 1 TABLET (10 MG TOTAL) BY MOUTH DAILY. (Patient taking differently: Take 10 mg by mouth daily. ) 28 tablet 3  .  atorvastatin (LIPITOR) 20 MG tablet Take 1 tablet (20 mg total) by mouth at bedtime. 90 tablet 3  . Calcium Carb-Cholecalciferol (CALCIUM 600 + D PO) Take 1 tablet by mouth daily.    [name] exemestane (AROMASIN) 25 MG tablet Take 1 tablet (25  mg total) by mouth daily after breakfast. 90 tablet 3  . ferrous sulfate (SLOW IRON) 160 (50 Fe) MG TBCR SR tablet Take 1 tablet (160 mg total) by mouth daily. 30 tablet 0  . hydrochlorothiazide (MICROZIDE) 12.5 MG capsule TAKE 1 CAPSULE BY MOUTH EVERY DAY 90 capsule 1  . Multiple Vitamin (MULTIVITAMIN WITH MINERALS) TABS tablet Take 1 tablet by mouth daily.    . nitroGLYCERIN (NITROSTAT) 0.4 MG SL tablet PLACE 1 TABLET (0.4 MG TOTAL) UNDER THE TONGUE EVERY 5 (FIVE) MINUTES AS NEEDED FOR CHEST PAIN. 25 tablet 0  . omeprazole (PRILOSEC) 40 MG capsule Take 1 capsule (40 mg total) by mouth 2 (two) times daily. 60 capsule 0  . ondansetron (ZOFRAN ODT) 8 MG disintegrating tablet Take 1 tablet (8 mg total) by mouth every 8 (eight) hours as needed for nausea or vomiting. 30 tablet 3  . potassium chloride SA (K-DUR) 20 MEQ tablet Take 1 tablet (20 mEq total) by mouth daily. 30 tablet 3   No current facility-administered medications for this visit.     PHYSICAL EXAMINATION: ECOG PERFORMANCE STATUS: 1 - Symptomatic but completely ambulatory  Vitals:   09/09/19 0858  BP: 125/70  Pulse: 71  Resp: 16  Temp: (!) 97.3 F (36.3 C)  SpO2: 100%   Filed Weights   09/09/19 0858  Weight: 141 lb 11.2 oz (64.3 kg)    GENERAL: alert, no distress and comfortable SKIN: skin color, texture, turgor are normal, no rashes or significant lesions EYES: normal, Conjunctiva are pink and non-injected, sclera clear OROPHARYNX: no exudate, no erythema and lips, buccal mucosa, and tongue normal  NECK: supple, thyroid normal size, non-tender, without nodularity LYMPH: no palpable lymphadenopathy in the cervical, axillary or inguinal LUNGS: clear to auscultation and percussion with normal breathing effort HEART: regular rate & rhythm and no murmurs and no lower extremity edema ABDOMEN: abdomen soft, non-tender and normal bowel sounds MUSCULOSKELETAL: no cyanosis of digits and no clubbing  NEURO: alert & oriented x 3  with fluent speech, no focal motor/sensory deficits EXTREMITIES: No lower extremity edema  LABORATORY DATA:  I have reviewed the data as listed CMP Latest Ref Rng & Units 09/08/2019 08/30/2019 08/22/2019  Glucose 70 - 99 mg/dL 93 105(H) 94  BUN 8 - 23 mg/dL 16 12 6(L)  Creatinine 0.44 - 1.00 mg/dL 1.14(H) 1.00 0.77  Sodium 135 - 145 mmol/L 140 139 138  Potassium 3.5 - 5.1 mmol/L 3.8 4.3 3.4(L)  Chloride 98 - 111 mmol/L 100 102 111  CO2 22 - 32 mmol/L 26 28 19(L)  Calcium 8.9 - 10.3 mg/dL 9.3 8.8 7.6(L)  Total Protein 6.5 - 8.1 g/dL 7.4 - -  Total Bilirubin 0.3 - 1.2 mg/dL 0.4 - -  Alkaline Phos 38 - 126 U/L 104 - -  AST 15 - 41 U/L 33 - -  ALT 0 - 44 U/L 15 - -    Lab Results  Component Value Date   WBC 4.8 09/08/2019   HGB 12.7 09/08/2019   HCT 40.1 09/08/2019   MCV 90.5 09/08/2019   PLT 201 09/08/2019   NEUTROABS 3.5 09/08/2019    ASSESSMENT & PLAN:  Malignant neoplasm of upper-outer quadrant of left breast in female, estrogen  receptor positive ([place]) Left breast cancer T3, N2, 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 She had radiation therapy and has been on antiestrogen therapy with Arimidex 06/28/2014-January 2019  Thoracentesis: Met Breast cancer ER PR Positive and Her 2 Neg PET/CT scan. 01/04/18: New malignant Pleural effusion with small bil pulm nodules,new mild bilateral mediastinal hilar portocaval lymph node metastases, new liver metastases, new diffuse bone metastases.  Ibrance with Faslodex 12/29/2017-02/27/2019 [facility] molecular testing: PIK3CA mutation present, androgen receptor positive, negative for BRCA1, 2 and PDL 1. --------------------------------------------------------------------------- Current treatment:Exemestane with everolimus started 03/09/2019  06/25/2019:Liver biopsy: Metastatic adenocarcinoma to the liver consistent  with breast cancer, ER 100%, PR 0%, Ki-67 10%, HER-2 -1+ by IHC I discussed the [facility] molecular testing results with the patient which showed PI K3 CA mutation as well as ER positivity.  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  1.9 cm left liver, interval progression of necrotic portacaval lymph node 14 mm previously was 8 mm in the upper abdomen stable bone metastases.  Radiology review: I discussed the radiology images with the patient.  I discussed with her that one of the liver lesions are smaller but one of the lymph nodes is larger.  I would call her disease is stable and recommended continuation of current treatment for 3 more months before repeating another set of scans.      No orders of the defined types were placed in this encounter.  The patient has a good understanding of the overall plan. she agrees with it. she will call with any problems that may develop before the next visit here.  [name] Lose, MD 09/09/2019  Julious Oka Dorshimer am acting as scribe for Dr. [name] Lose.  I have reviewed the above documentation for accuracy and completeness, and I agree with the above.

## 2019-09-09 ENCOUNTER — Inpatient Hospital Stay (HOSPITAL_BASED_OUTPATIENT_CLINIC_OR_DEPARTMENT_OTHER): Payer: Medicare Other | Admitting: Hematology and Oncology

## 2019-09-09 ENCOUNTER — Other Ambulatory Visit: Payer: Self-pay

## 2019-09-09 DIAGNOSIS — Z170 Estrogen receptor positive status [ER+]: Secondary | ICD-10-CM | POA: Diagnosis not present

## 2019-09-09 DIAGNOSIS — J910 Malignant pleural effusion: Secondary | ICD-10-CM | POA: Diagnosis not present

## 2019-09-09 DIAGNOSIS — C7802 Secondary malignant neoplasm of left lung: Secondary | ICD-10-CM | POA: Diagnosis not present

## 2019-09-09 DIAGNOSIS — C50412 Malignant neoplasm of upper-outer quadrant of left female breast: Secondary | ICD-10-CM | POA: Diagnosis not present

## 2019-09-09 DIAGNOSIS — C7801 Secondary malignant neoplasm of right lung: Secondary | ICD-10-CM | POA: Diagnosis not present

## 2019-09-09 DIAGNOSIS — C7951 Secondary malignant neoplasm of bone: Secondary | ICD-10-CM | POA: Diagnosis not present

## 2019-09-09 DIAGNOSIS — C779 Secondary and unspecified malignant neoplasm of lymph node, unspecified: Secondary | ICD-10-CM | POA: Diagnosis not present

## 2019-09-09 MED ORDER — DEXAMETHASONE 1 MG PO TABS: 0.5000 mg | ORAL_TABLET | Freq: Every day | ORAL | 3 refills | Status: DC

## 2019-09-09 NOTE — Assessment & Plan Note (Signed)
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 She had radiation therapy and has been on antiestrogen therapy with Arimidex 06/28/2014-January 2019  Thoracentesis: Met Breast cancer ER PR Positive and Her 2 Neg PET/CT scan. 01/04/18: New malignant Pleural effusion with small bil pulm nodules,new mild bilateral mediastinal hilar portocaval lymph node metastases, new liver metastases, new diffuse bone metastases.  Ibrance with Faslodex 12/29/2017-02/27/2019 [facility] molecular testing: PIK3CA mutation present, androgen receptor positive, negative for BRCA1, 2 and PDL 1. 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  1.9 cm left liver, interval progression of necrotic portacaval lymph node 14 mm previously was 8 mm in the upper abdomen stable bone metastases.  Radiology review: I discussed the radiology images with the patient.  I discussed with her that one of the liver lesions are smaller but one of the lymph nodes is larger.  I would call her disease is stable and recommended continuation of current treatment for 3 more months before repeating another set of scans.

## 2019-09-10 ENCOUNTER — Telehealth: Payer: Self-pay | Admitting: Hematology and Oncology

## 2019-09-10 MED FILL — POTASSIUM CHLORIDE CRYS ER: 20 | 30 days supply | Qty: 30 | Fill #1

## 2019-09-10 NOTE — Telephone Encounter (Signed)
I talk with patient regarding schedule  

## 2019-09-11 ENCOUNTER — Other Ambulatory Visit: Payer: Self-pay | Admitting: Hematology and Oncology

## 2019-09-13 ENCOUNTER — Encounter: Payer: Self-pay | Admitting: Physician Assistant

## 2019-09-13 ENCOUNTER — Other Ambulatory Visit: Payer: Self-pay | Admitting: Hematology and Oncology

## 2019-09-13 ENCOUNTER — Ambulatory Visit (INDEPENDENT_AMBULATORY_CARE_PROVIDER_SITE_OTHER): Payer: Medicare Other | Admitting: Physician Assistant

## 2019-09-13 ENCOUNTER — Other Ambulatory Visit: Payer: Self-pay

## 2019-09-13 VITALS — BP 156/80 | HR 80 | Temp 97.4°F | Wt 140.0 lb

## 2019-09-13 DIAGNOSIS — B349 Viral infection, unspecified: Secondary | ICD-10-CM

## 2019-09-13 NOTE — Progress Notes (Signed)
I have discussed the procedure for the virtual visit with the patient who has given consent to proceed with assessment and treatment.   Dayvian Blixt S Paulette Rockford, CMA     

## 2019-09-13 NOTE — Patient Instructions (Addendum)
Instructions sent to MyChart.   Please keep well-hydrated and get plenty of rest.  Tylenol if needed for tenderness. Start a saline nasal rinse to help flush out nasal passages -- I know the Afinitor can cause and worsen this.   Please keep a close eye for any new or worsening symptoms.  Since things are improved today, I plan for that to continue until symptoms resolve. If you note anything worsening, call me ASAP.  I will call you Wednesday morning to check on you.  Hang in there!

## 2019-09-13 NOTE — Progress Notes (Signed)
Virtual Visit via Video   I connected with patient on 09/13/19 at 10:00 AM EDT by a video enabled telemedicine application and verified that I am speaking with the correct person using two identifiers.  Location patient: Home Location provider: [facility] Office Persons participating in the virtual visit: Patient, Provider, [place] (Patina Moore)  I discussed the limitations of evaluation and management by telemedicine and the availability of in person appointments. The patient expressed understanding and agreed to proceed.  Subjective:   HPI:   Noticed swollen lymph nodes about 5 days ago with associated tenderness.   Reports decreased appetite, watery eyes and rhinorrhea but states this is not new, has been continuous since starting Afinitor. No change in these symptoms over past week. Denies odynophagia, dysphagia or sore throat. Denies fever or chills, cough, congestion, sore throat, nausea or vomiting, diarrhea, or headache. Has been taking two Tylenol before bed every night. Today feels less tender and swollen -- can still feel them but not as bad. Is at home during the day, denies sick contacts.    ROS:   See pertinent positives and negatives per HPI.  Patient Active Problem List   Diagnosis Date Noted  . Hypotension due to hypovolemia   . Acute blood loss anemia   . Elevated troponin   . Demand ischemia ([place])   . Gastroesophageal reflux disease   . Metastatic breast cancer ([place])   . Hypokalemia   . Symptomatic anemia 08/16/2019  . Thrombocytopenia ([place]) 12/01/2018  . Liver metastases ([place]) 11/02/2018  . Acute diastolic CHF (congestive heart failure) ([place]) 09/01/2018  . Pleural effusion 09/01/2018  . Bone metastases ([place]) 05/27/2018  . Metastatic cancer to lung ([place]) 12/22/2017  . Streptococcal bacteremia 09/24/2017  . CKD (chronic kidney disease), stage III 09/22/2017  . Sepsis ([place]) 09/22/2017  . Lower urinary tract infectious disease 09/22/2017  . Chest  pain 09/22/2017  . Left arm cellulitis 09/22/2017  . Obesity (BMI 30-39.9) 01/06/2014  . Hyperlipidemia 01/06/2014  . CAD S/P percutaneous coronary angioplasty   . Essential hypertension   . Carotid artery disease ([place])   . Malignant neoplasm of upper-outer quadrant of left breast in female, estrogen receptor positive ([place]) 10/29/2013    Social History   Tobacco Use  . Smoking status: Former Smoker    Packs/day: 0.30    Types: Cigarettes    Quit date: 11/09/1983    Years since quitting: 35.8  . Smokeless tobacco: Never Used  Substance Use Topics  . Alcohol use: Not Currently    Current Outpatient Medications:  .  acetaminophen (TYLENOL) 500 MG tablet, Take 1,000 mg by mouth every 6 (six) hours as needed for mild pain., Disp: , Rfl:  .  AFINITOR 10 MG tablet, TAKE 1 TABLET (10 MG TOTAL) BY MOUTH DAILY. (Patient taking differently: Take 10 mg by mouth daily. ), Disp: 28 tablet, Rfl: 3 .  atorvastatin (LIPITOR) 20 MG tablet, Take 1 tablet (20 mg total) by mouth at bedtime., Disp: 90 tablet, Rfl: 3 .  Calcium Carb-Cholecalciferol (CALCIUM 600 + D PO), Take 1 tablet by mouth daily., Disp: , Rfl:  .  dexamethasone (DECADRON) 1 MG tablet, Take 0.5 tablets (0.5 mg total) by mouth daily., Disp: 30 tablet, Rfl: 3 .  exemestane (AROMASIN) 25 MG tablet, Take 1 tablet (25 mg total) by mouth daily after breakfast., Disp: 90 tablet, Rfl: 3 .  ferrous sulfate (SLOW IRON) 160 (50 Fe) MG TBCR SR tablet, Take 1 tablet (160 mg total) by mouth  daily., Disp: 30 tablet, Rfl: 0 .  hydrochlorothiazide (MICROZIDE) 12.5 MG capsule, TAKE 1 CAPSULE BY MOUTH EVERY DAY, Disp: 90 capsule, Rfl: 1 .  Multiple Vitamin (MULTIVITAMIN WITH MINERALS) TABS tablet, Take 1 tablet by mouth daily., Disp: , Rfl:  .  nitroGLYCERIN (NITROSTAT) 0.4 MG SL tablet, PLACE 1 TABLET (0.4 MG TOTAL) UNDER THE TONGUE EVERY 5 (FIVE) MINUTES AS NEEDED FOR CHEST PAIN., Disp: 25 tablet, Rfl: 0 .  omeprazole (PRILOSEC) 40 MG capsule, Take 1  capsule (40 mg total) by mouth 2 (two) times daily., Disp: 60 capsule, Rfl: 0 .  ondansetron (ZOFRAN ODT) 8 MG disintegrating tablet, Take 1 tablet (8 mg total) by mouth every 8 (eight) hours as needed for nausea or vomiting., Disp: 30 tablet, Rfl: 3 .  potassium chloride SA (K-DUR) 20 MEQ tablet, Take 1 tablet (20 mEq total) by mouth daily., Disp: 30 tablet, Rfl: 3  No Known Allergies  Objective:   There were no vitals taken for this visit.  Patient is well-developed, well-nourished in no acute distress.  Resting comfortably at home.  Head is normocephalic, atraumatic.  No labored breathing.  Speech is clear and coherent with logical content.  Patient is alert and oriented at baseline.   Assessment and Plan:   1. Viral syndrome Mild symptoms. Improving. Low concern for COVID. Continue supportive measures. OTC medications reviewed. She is to report any new symptoms or if symptoms are not continuing to resolve. Continue home quarantine. Instructions sent to MyChart. Will call patient Wed AM to check on her.      Leeanne Rio, [name] 09/13/2019

## 2019-09-16 ENCOUNTER — Other Ambulatory Visit: Payer: Self-pay | Admitting: Hematology and Oncology

## 2019-09-23 MED FILL — AFINITOR 10 MG TABLET: 10 | 28 days supply | Qty: 28 | Fill #3

## 2019-09-28 DIAGNOSIS — R197 Diarrhea, unspecified: Secondary | ICD-10-CM | POA: Diagnosis not present

## 2019-09-29 DIAGNOSIS — R197 Diarrhea, unspecified: Secondary | ICD-10-CM | POA: Diagnosis not present

## 2019-10-01 ENCOUNTER — Ambulatory Visit (HOSPITAL_COMMUNITY)
Admission: RE | Admit: 2019-10-01 | Discharge: 2019-10-01 | Disposition: A | Discharge status: Home / Self Care | Payer: Medicare Other | Source: Ambulatory Visit | Attending: Internal Medicine | Admitting: Internal Medicine

## 2019-10-01 ENCOUNTER — Other Ambulatory Visit: Payer: Self-pay

## 2019-10-01 DIAGNOSIS — R0989 Other specified symptoms and signs involving the circulatory and respiratory systems: Secondary | ICD-10-CM | POA: Diagnosis not present

## 2019-10-11 MED FILL — POTASSIUM CHLORIDE CRYS ER: 20 | 30 days supply | Qty: 30 | Fill #2

## 2019-10-18 ENCOUNTER — Other Ambulatory Visit: Payer: Self-pay | Admitting: Hematology and Oncology

## 2019-10-25 MED FILL — AFINITOR 10 MG TABLET: 10 | 28 days supply | Qty: 28 | Fill #0

## 2019-10-26 ENCOUNTER — Encounter: Payer: Self-pay | Admitting: Cardiovascular Disease

## 2019-10-26 ENCOUNTER — Other Ambulatory Visit: Payer: Self-pay

## 2019-10-26 ENCOUNTER — Ambulatory Visit (INDEPENDENT_AMBULATORY_CARE_PROVIDER_SITE_OTHER): Payer: Medicare Other | Admitting: Cardiovascular Disease

## 2019-10-26 DIAGNOSIS — I10 Essential (primary) hypertension: Secondary | ICD-10-CM

## 2019-10-26 DIAGNOSIS — I2510 Atherosclerotic heart disease of native coronary artery without angina pectoris: Secondary | ICD-10-CM | POA: Diagnosis not present

## 2019-10-26 DIAGNOSIS — E782 Mixed hyperlipidemia: Secondary | ICD-10-CM

## 2019-10-26 DIAGNOSIS — Z9861 Coronary angioplasty status: Secondary | ICD-10-CM | POA: Diagnosis not present

## 2019-10-26 DIAGNOSIS — I779 Disorder of arteries and arterioles, unspecified: Secondary | ICD-10-CM

## 2019-10-26 DIAGNOSIS — I5031 Acute diastolic (congestive) heart failure: Secondary | ICD-10-CM

## 2019-10-26 NOTE — Assessment & Plan Note (Signed)
History of essential hypertension with blood pressure measured today at 130/64.  She is on hydrochlorothiazide.

## 2019-10-26 NOTE — Assessment & Plan Note (Signed)
History of hyperlipidemia on statin therapy with lipid profile performed 03/29/2019 revealing total cholesterol of 157, LDL of 79 HDL 61

## 2019-10-26 NOTE — Assessment & Plan Note (Signed)
History of chronic diastolic heart failure on hydrochlorothiazide with recent 2D echocardiogram performed 08/16/2019 revealing normal LV systolic function with diastolic dysfunction.  She denies shortness of breath or peripheral edema.

## 2019-10-26 NOTE — Assessment & Plan Note (Signed)
History of CAD status post remote PCI by Dr. Wynonia Lawman of the LAD and RCA back in 2005.  The patient denies chest pain

## 2019-10-26 NOTE — Progress Notes (Signed)
10/26/2019 Wendy [name]   10/23/1941  [id number]  Primary Physician [name], [name] Primary Cardiologist: Lorretta Harp MD [name], [place]  HPI:  Wendy [name] is a 78 y.o.  thin appearing divorced Caucasian female mother 56, grandmother of 6 grandchildren previous patient of Dr. Tollie Eth who I saw as a new patient in our practice 10/14/2018.[name]  Primary care providers are [name].  Risk factors include treated hyperlipidemia.  She does have a history of ischemic heart disease status post stenting by Dr. Wynonia Lawman in 2005.  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  She has had breast cancer with mastectomy, chemotherapy and radiation therapy with lung metastases and pleural effusion.  She did have a 2D echo performed 09/10/2018 which was entirely normal with grade 1 diastolic dysfunction.  Since I saw her a year ago she was hospitalized last month with a GI bleed requiring 5 units of packed red blood cells.  She has had no recurrent symptoms.  Otherwise she denies chest pain or shortness of breath.   Current Meds  Medication Sig  . acetaminophen (TYLENOL) 500 MG tablet Take 1,000 mg by mouth every 6 (six) hours as needed for mild pain.  [name] AFINITOR 10 MG tablet TAKE 1 TABLET (10 MG TOTAL) BY MOUTH DAILY.  [name] atorvastatin (LIPITOR) 20 MG tablet Take 1 tablet (20 mg total) by mouth at bedtime.  . Calcium Carb-Cholecalciferol (CALCIUM 600 + D PO) Take 1 tablet by mouth daily.  [name] dexamethasone (DECADRON) 1 MG tablet TAKE 1 TABLET BY MOUTH EVERY DAY  . exemestane (AROMASIN) 25 MG tablet Take 1 tablet (25 mg total) by mouth daily after breakfast.  . ferrous sulfate (SLOW IRON) 160 (50 Fe) MG TBCR SR tablet Take 1 tablet (160 mg total) by mouth daily.  . hydrochlorothiazide (MICROZIDE) 12.5 MG capsule TAKE 1 CAPSULE BY MOUTH EVERY DAY  . Multiple Vitamin (MULTIVITAMIN WITH MINERALS) TABS tablet Take 1 tablet by mouth daily.  .  nitroGLYCERIN (NITROSTAT) 0.4 MG SL tablet PLACE 1 TABLET (0.4 MG TOTAL) UNDER THE TONGUE EVERY 5 (FIVE) MINUTES AS NEEDED FOR CHEST PAIN.  [name] omeprazole (PRILOSEC) 40 MG capsule Take 1 capsule (40 mg total) by mouth 2 (two) times daily.  . ondansetron (ZOFRAN ODT) 8 MG disintegrating tablet Take 1 tablet (8 mg total) by mouth every 8 (eight) hours as needed for nausea or vomiting.  . potassium chloride SA (K-DUR) 20 MEQ tablet Take 1 tablet (20 mEq total) by mouth daily.     No Known Allergies  Social History   Socioeconomic History  . Marital status: Divorced    Spouse name: Not on file  . Number of children: 3  . Years of education: Not on file  . Highest education level: Not on file  Occupational History    Employer: [employer]  . Financial resource strain: Not on file  . Food insecurity    Worry: Not on file    Inability: Not on file  . Transportation needs    Medical: Not on file    Non-medical: Not on file  Tobacco Use  . Smoking status: Former Smoker    Packs/day: 0.30    Types: Cigarettes    Quit date: 11/09/1983    Years since quitting: 35.9  . Smokeless tobacco: Never Used  Substance and Sexual Activity  . Alcohol use: Not Currently  . Drug use: No  .  Sexual activity: Not Currently  Lifestyle  . Physical activity    Days per week: Not on file    Minutes per session: Not on file  . Stress: Not on file  Relationships  . Social Herbalist on phone: Not on file    Gets together: Not on file    Attends religious service: Not on file    Active member of club or organization: Not on file    Attends meetings of clubs or organizations: Not on file    Relationship status: Not on file  . Intimate partner violence    Fear of current or ex partner: Not on file    Emotionally abused: Not on file    Physically abused: Not on file    Forced sexual activity: Not on file  Other Topics Concern  . Not on file  Social History Narrative  . Not on  file     Review of Systems: General: negative for chills, fever, night sweats or weight changes.  Cardiovascular: negative for chest pain, dyspnea on exertion, edema, orthopnea, palpitations, paroxysmal nocturnal dyspnea or shortness of breath Dermatological: negative for rash Respiratory: negative for cough or wheezing Urologic: negative for hematuria Abdominal: negative for nausea, vomiting, diarrhea, bright red blood per rectum, melena, or hematemesis Neurologic: negative for visual changes, syncope, or dizziness All other systems reviewed and are otherwise negative except as noted above.    Blood pressure 138/64, pulse 86, temperature (!) 95.5 F (35.3 C), height 5\' 8"  (1.727 m), weight 144 lb (65.3 kg).  General appearance: alert and no distress Neck: no adenopathy, no JVD, supple, symmetrical, trachea midline, thyroid not enlarged, symmetric, no tenderness/mass/nodules and Right carotid bruit Lungs: clear to auscultation bilaterally Heart: Soft outflow tract murmur Extremities: extremities normal, atraumatic, no cyanosis or edema Pulses: 2+ and symmetric Skin: Skin color, texture, turgor normal. No rashes or lesions Neurologic: Alert and oriented X 3, normal strength and tone. Normal symmetric reflexes. Normal coordination and gait  EKG not performed today  ASSESSMENT AND PLAN:   Essential hypertension History of essential hypertension with blood pressure measured today at 130/64.  She is on hydrochlorothiazide.  Hyperlipidemia History of hyperlipidemia on statin therapy with lipid profile performed 03/29/2019 revealing total cholesterol of 157, LDL of 79 HDL 61  Carotid artery disease History of carotid artery disease with a right carotid bruit and recent carotid Dopplers that showed a right external carotid artery stenosis probably contributing to the bruit but her internal carotid arteries were normal bilaterally  CAD S/P percutaneous coronary angioplasty History of CAD  status post remote PCI by Dr. Wynonia Lawman of the LAD and RCA back in 2005.  The patient denies chest pain  Acute diastolic CHF (congestive heart failure) (HCC) History of chronic diastolic heart failure on hydrochlorothiazide with recent 2D echocardiogram performed 08/16/2019 revealing normal LV systolic function with diastolic dysfunction.  She denies shortness of breath or peripheral edema.      Lorretta Harp MD FACP,FACC,FAHA, [facility] 10/26/2019 8:25 AM

## 2019-10-26 NOTE — Assessment & Plan Note (Signed)
History of carotid artery disease with a right carotid bruit and recent carotid Dopplers that showed a right external carotid artery stenosis probably contributing to the bruit but her internal carotid arteries were normal bilaterally

## 2019-10-26 NOTE — Patient Instructions (Signed)
Medication Instructions:  Your physician recommends that you continue on your current medications as directed. Please refer to the Current Medication list given to you today.  If you need a refill on your cardiac medications before your next appointment, please call your pharmacy.   Lab work: NONE  Testing/Procedures: NONE  Follow-Up: At [facility], you and your health needs are our priority.  As part of our continuing mission to provide you with exceptional heart care, we have created designated Provider Care Teams.  These Care Teams include your primary Cardiologist (physician) and Advanced Practice Providers (APPs -  Physician Assistants and Nurse Practitioners) who all work together to provide you with the care you need, when you need it. You may see Dr Berry or one of the following Advanced Practice Providers on your designated Care Team:    Luke Kilroy, [name]  Callie Goodrich, [name]  Jesse Cleaver, FNP  Your physician wants you to follow-up in: 1 year. You will receive a reminder letter in the mail two months in advance. If you don't receive a letter, please call our office to schedule the follow-up appointment.      

## 2019-11-03 DIAGNOSIS — A0472 Enterocolitis due to Clostridium difficile, not specified as recurrent: Secondary | ICD-10-CM | POA: Diagnosis not present

## 2019-11-03 DIAGNOSIS — K922 Gastrointestinal hemorrhage, unspecified: Secondary | ICD-10-CM | POA: Diagnosis not present

## 2019-11-15 MED FILL — POTASSIUM CHLORIDE CRYS ER: 20 | 30 days supply | Qty: 30 | Fill #3

## 2019-11-16 MED FILL — AFINITOR 10 MG TABLET: 10 | 28 days supply | Qty: 28 | Fill #1

## 2019-12-08 ENCOUNTER — Other Ambulatory Visit: Payer: Self-pay | Admitting: *Deleted

## 2019-12-08 DIAGNOSIS — C50412 Malignant neoplasm of upper-outer quadrant of left female breast: Secondary | ICD-10-CM

## 2019-12-08 DIAGNOSIS — Z170 Estrogen receptor positive status [ER+]: Secondary | ICD-10-CM

## 2019-12-09 ENCOUNTER — Inpatient Hospital Stay: Payer: Medicare Other | Attending: Hematology and Oncology

## 2019-12-09 ENCOUNTER — Other Ambulatory Visit: Payer: Self-pay

## 2019-12-09 DIAGNOSIS — C50412 Malignant neoplasm of upper-outer quadrant of left female breast: Secondary | ICD-10-CM | POA: Insufficient documentation

## 2019-12-09 DIAGNOSIS — C7800 Secondary malignant neoplasm of unspecified lung: Secondary | ICD-10-CM

## 2019-12-09 DIAGNOSIS — C7802 Secondary malignant neoplasm of left lung: Secondary | ICD-10-CM | POA: Diagnosis not present

## 2019-12-09 DIAGNOSIS — C787 Secondary malignant neoplasm of liver and intrahepatic bile duct: Secondary | ICD-10-CM | POA: Diagnosis not present

## 2019-12-09 DIAGNOSIS — C7801 Secondary malignant neoplasm of right lung: Secondary | ICD-10-CM | POA: Diagnosis not present

## 2019-12-09 DIAGNOSIS — C779 Secondary and unspecified malignant neoplasm of lymph node, unspecified: Secondary | ICD-10-CM | POA: Diagnosis not present

## 2019-12-09 DIAGNOSIS — C7951 Secondary malignant neoplasm of bone: Secondary | ICD-10-CM | POA: Diagnosis not present

## 2019-12-09 DIAGNOSIS — E785 Hyperlipidemia, unspecified: Secondary | ICD-10-CM | POA: Diagnosis not present

## 2019-12-09 DIAGNOSIS — Z170 Estrogen receptor positive status [ER+]: Secondary | ICD-10-CM | POA: Diagnosis not present

## 2019-12-09 DIAGNOSIS — D649 Anemia, unspecified: Secondary | ICD-10-CM | POA: Insufficient documentation

## 2019-12-09 DIAGNOSIS — Z79899 Other long term (current) drug therapy: Secondary | ICD-10-CM | POA: Diagnosis not present

## 2019-12-09 DIAGNOSIS — K219 Gastro-esophageal reflux disease without esophagitis: Secondary | ICD-10-CM | POA: Diagnosis not present

## 2019-12-09 DIAGNOSIS — Z7952 Long term (current) use of systemic steroids: Secondary | ICD-10-CM | POA: Insufficient documentation

## 2019-12-09 DIAGNOSIS — Z79811 Long term (current) use of aromatase inhibitors: Secondary | ICD-10-CM | POA: Insufficient documentation

## 2019-12-09 DIAGNOSIS — Z5111 Encounter for antineoplastic chemotherapy: Secondary | ICD-10-CM | POA: Insufficient documentation

## 2019-12-09 DIAGNOSIS — Z923 Personal history of irradiation: Secondary | ICD-10-CM | POA: Diagnosis not present

## 2019-12-09 LAB — CBC WITH DIFFERENTIAL (CANCER CENTER ONLY)
Abs Immature Granulocytes: 0.23 10*3/uL — ABNORMAL HIGH (ref 0.00–0.07)
Basophils Absolute: 0.1 10*3/uL (ref 0.0–0.1)
Basophils Relative: 1 %
Eosinophils Absolute: 0.1 10*3/uL (ref 0.0–0.5)
Eosinophils Relative: 1 %
HCT: 35.8 % — ABNORMAL LOW (ref 36.0–46.0)
Hemoglobin: 11.1 g/dL — ABNORMAL LOW (ref 12.0–15.0)
Immature Granulocytes: 5 %
Lymphocytes Relative: 19 %
Lymphs Abs: 0.9 10*3/uL (ref 0.7–4.0)
MCH: 28 pg (ref 26.0–34.0)
MCHC: 31 g/dL (ref 30.0–36.0)
MCV: 90.4 fL (ref 80.0–100.0)
Monocytes Absolute: 0.5 10*3/uL (ref 0.1–1.0)
Monocytes Relative: 9 %
Neutro Abs: 3.3 10*3/uL (ref 1.7–7.7)
Neutrophils Relative %: 65 %
Platelet Count: 245 10*3/uL (ref 150–400)
RBC: 3.96 MIL/uL (ref 3.87–5.11)
RDW: 18.2 % — ABNORMAL HIGH (ref 11.5–15.5)
WBC Count: 5.1 10*3/uL (ref 4.0–10.5)
nRBC: 0 % (ref 0.0–0.2)

## 2019-12-09 LAB — CMP (CANCER CENTER ONLY)
ALT: 22 U/L (ref 0–44)
AST: 46 U/L — ABNORMAL HIGH (ref 15–41)
Albumin: 3.9 g/dL (ref 3.5–5.0)
Alkaline Phosphatase: 85 U/L (ref 38–126)
Anion gap: 11 (ref 5–15)
BUN: 22 mg/dL (ref 8–23)
CO2: 29 mmol/L (ref 22–32)
Calcium: 8.9 mg/dL (ref 8.9–10.3)
Chloride: 103 mmol/L (ref 98–111)
Creatinine: 1.09 mg/dL — ABNORMAL HIGH (ref 0.44–1.00)
GFR, Est AFR Am: 56 mL/min — ABNORMAL LOW (ref >60)
GFR, Estimated: 49 mL/min — ABNORMAL LOW (ref >60)
Glucose, Bld: 84 mg/dL (ref 70–99)
Potassium: 3.5 mmol/L (ref 3.5–5.1)
Sodium: 143 mmol/L (ref 135–145)
Total Bilirubin: 0.3 mg/dL (ref 0.3–1.2)
Total Protein: 7.2 g/dL (ref 6.5–8.1)

## 2019-12-10 ENCOUNTER — Other Ambulatory Visit: Payer: Medicare Other

## 2019-12-10 ENCOUNTER — Ambulatory Visit (HOSPITAL_COMMUNITY)
Admission: RE | Admit: 2019-12-10 | Discharge: 2019-12-10 | Disposition: A | Discharge status: Home / Self Care | Payer: Medicare Other | Source: Ambulatory Visit | Attending: Hematology and Oncology | Admitting: Hematology and Oncology

## 2019-12-10 DIAGNOSIS — C50412 Malignant neoplasm of upper-outer quadrant of left female breast: Secondary | ICD-10-CM | POA: Insufficient documentation

## 2019-12-10 DIAGNOSIS — C50912 Malignant neoplasm of unspecified site of left female breast: Secondary | ICD-10-CM | POA: Diagnosis not present

## 2019-12-10 DIAGNOSIS — Z170 Estrogen receptor positive status [ER+]: Secondary | ICD-10-CM | POA: Insufficient documentation

## 2019-12-10 DIAGNOSIS — C787 Secondary malignant neoplasm of liver and intrahepatic bile duct: Secondary | ICD-10-CM | POA: Diagnosis not present

## 2019-12-10 MED ORDER — IOHEXOL 300 MG/ML  SOLN
80.0000 mL | Freq: Once | INTRAMUSCULAR | Status: AC | PRN
  Administered 2019-12-10: 80 mL via INTRAVENOUS

## 2019-12-12 NOTE — Progress Notes (Signed)
Patient Care Team: Delorse Limber as PCP - General (Family Medicine)  DIAGNOSIS:    ICD-10-CM   1. Malignant neoplasm metastatic to lung, unspecified laterality ([place])  C78.00   2. Liver metastases ([place])  C78.7   3. Malignant neoplasm of upper-outer quadrant of left breast in female, estrogen receptor positive ([place])  C50.412    Z17.0     SUMMARY OF ONCOLOGIC HISTORY: Oncology History  Malignant neoplasm of upper-outer quadrant of left breast in female, estrogen receptor positive ([place])  07/28/2013 Mammogram   Spiculated mass at 6:00 position 3 cm, ultrasound revealed 2.6 x 2.1 x 1.5 cm second mass at 12:00 2.1 x 1.8 x 2.1 cm: 2 abnormal lymph nodes left axilla   10/29/2013 Initial Diagnosis   Breast cancer of upper-outer quadrant of left female breast: 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 [name], T3N2 (5/10 LN positive) stage IIIa IDC grade 1, ER/PR 100%, TSH 14% HER-2 negative PET/CT 12/20/2013 negative for metastases   12/17/2013 - 04/02/2014 Chemotherapy   Taxotere Cytoxan every 3 weeks for 6 cycles   05/11/2014 - 06/27/2014 Radiation Therapy   Adjuvant radiation therapy to chest wall and lymph nodes   07/06/2014 -  Anti-estrogen oral therapy   Arimidex 1 mg daily   12/21/2017 Imaging   Metastatic lesions noted in the lungs bilaterally, bilateral hilar and mediastinal lymph nodes, left pleural effusion, multiple thoracic vertebral metastases, heterogeneous lesions in the liver   12/22/2017 Procedure   Pleural Effusion Thoracentesis: Metastatic Breast cancer ER PR Positive Her 2 Neg   12/29/2017 Treatment Plan Change   Ibrance, Faslodex and Xgeva   03/09/2019 Treatment Plan Change   Everolimus and Exemestane   06/25/2019 Pathology Results   Liver biopsy: 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, ER 100%, PR 0%, Ki-67 10%, HER-2 -1+ by IHC     CHIEF COMPLIANT:  Follow-up of metastatic breast cancer to review scans  INTERVAL HISTORY: Wendy [name] is a 79 y.o. with above-mentioned history of metastatic breast cancer currently on treatment witheverolimus and exemestaneand Xgeva. CT CAP on 12/10/19 showed mild progression of hepatic metastasis, mild enlargement of metastatic periportal lymph node, and stable skeletal metastasis in the thoracic lumbar spine. She presents to the clinic today to review her scans and discuss further treatment.   She continues to have intermittent mouth sores.  Denies any nausea or vomiting.  She is watchful of getting worsening anemia.  Denies any abdominal pain nausea or vomiting.  ALLERGIES:  has No Known Allergies.  MEDICATIONS:  Current Outpatient Medications  Medication Sig Dispense Refill  . acetaminophen (TYLENOL) 500 MG tablet Take 1,000 mg by mouth every 6 (six) hours as needed for mild pain.    [name] alpelisib (PIQRAY 300MG DAILY DOSE) 2 x 150 MG Therapy Pack Take two 116m tablets with food at the same time daily. Swallow whole, do not crush, chew, or split. 60 each 3  . atorvastatin (LIPITOR) 20 MG tablet Take 1 tablet (20 mg total) by mouth at bedtime. 90 tablet 3  . Calcium Carb-Cholecalciferol (CALCIUM 600 + D PO) Take 1 tablet by mouth daily.    .Marland Kitchendexamethasone (DECADRON) 1 MG tablet TAKE 1 TABLET BY MOUTH EVERY DAY 30 tablet 3  . exemestane (AROMASIN) 25 MG tablet Take 1 tablet (25 mg total) by mouth daily after breakfast. 90 tablet 3  . ferrous sulfate (SLOW IRON) 160 (50 Fe)  MG TBCR SR tablet Take 1 tablet (160 mg total) by mouth daily. 30 tablet 0  . hydrochlorothiazide (MICROZIDE) 12.5 MG capsule TAKE 1 CAPSULE BY MOUTH EVERY DAY 90 capsule 1  . Multiple Vitamin (MULTIVITAMIN WITH MINERALS) TABS tablet Take 1 tablet by mouth daily.    . nitroGLYCERIN (NITROSTAT) 0.4 MG SL tablet PLACE 1 TABLET (0.4 MG TOTAL) UNDER THE TONGUE EVERY 5 (FIVE) MINUTES AS NEEDED FOR CHEST PAIN. 25 tablet 0  . omeprazole  (PRILOSEC) 40 MG capsule Take 1 capsule (40 mg total) by mouth 2 (two) times daily. 60 capsule 0  . ondansetron (ZOFRAN ODT) 8 MG disintegrating tablet Take 1 tablet (8 mg total) by mouth every 8 (eight) hours as needed for nausea or vomiting. 30 tablet 3  . potassium chloride SA (K-DUR) 20 MEQ tablet Take 1 tablet (20 mEq total) by mouth daily. 30 tablet 3   No current facility-administered medications for this visit.    PHYSICAL EXAMINATION: ECOG PERFORMANCE STATUS: 1 - Symptomatic but completely ambulatory  Vitals:   12/13/19 0910  BP: 140/72  Pulse: 76  Resp: 19  Temp: 98.7 F (37.1 C)  SpO2: 100%   Filed Weights   12/13/19 0910  Weight: 147 lb 9.6 oz (67 kg)    LABORATORY DATA:  I have reviewed the data as listed CMP Latest Ref Rng & Units 12/09/2019 09/08/2019 08/30/2019  Glucose 70 - 99 mg/dL 84 93 105(H)  BUN 8 - 23 mg/dL _0 Creatinine 0.44 - 1.00 mg/dL 1.09(H) 1.14(H) 1.00  Sodium 135 - 145 mmol/L 143 140 139  Potassium 3.5 - 5.1 mmol/L 3.5 3.8 4.3  Chloride 98 - 111 mmol/L 103 100 102  CO2 22 - 32 mmol/L _1 Calcium 8.9 - 10.3 mg/dL 8.9 9.3 8.8  Total Protein 6.5 - 8.1 g/dL 7.2 7.4 -  Total Bilirubin 0.3 - 1.2 mg/dL 0.3 0.4 -  Alkaline Phos 38 - 126 U/L 85 104 -  AST 15 - 41 U/L 46(H) 33 -  ALT 0 - 44 U/L 22 15 -    Lab Results  Component Value Date   WBC 5.1 12/09/2019   HGB 11.1 (L) 12/09/2019   HCT 35.8 (L) 12/09/2019   MCV 90.4 12/09/2019   PLT 245 12/09/2019   NEUTROABS 3.3 12/09/2019    ASSESSMENT & PLAN:  Malignant neoplasm of upper-outer quadrant of left breast in female, estrogen receptor positive ([place]) Left breast cancer T3, N2, 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 She had radiation therapy and has been on antiestrogen therapy with Arimidex 06/28/2014-January 2019  Thoracentesis: Met Breast cancer  ER PR Positive and Her 2 Neg PET/CT scan. 01/04/18: New malignant Pleural effusion with small bil pulm nodules,new mild bilateral mediastinal hilar portocaval lymph node metastases, new liver metastases, new diffuse bone metastases.  Ibrance with Faslodex 12/29/2017-02/27/2019 [facility] molecular testing: PIK3CA mutation present, androgen receptor positive, negative for BRCA1, 2 and PDL 1. 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  Smaller lesions also slightly increased.  5 mm to 10 mm, left hepatic lobe 18 mm to 23 mm.  Enlarged periportal lymph node 12 mm to 16 mm.  Bone lesions have not progressed.  Recommendation: Change in treatment to alpelisib with Faslodex Alpelisib (Piqray) Counseling: Alpelisib was studied in SOLAR-1 clinical trial: 597 patients postmenopausal woman who progressed on hormonal therapy were randomized to Fulvestrant + Alpelisib vs Fulvestrant. PFS was 11 months versus 5.7 months in patients who had PIK3CA mutation. Common side effects of Piqray are high blood sugar levels, increase in creatinine, diarrhea, rash, decrease in lymphocyte count, elevated liver enzymes, nausea, fatigue, anemia, increase in lipase, decreased appetite, stomatitis, vomiting, weight loss, low calcium levels, aPTT prolonged, and hair loss. I sent the prescription to our pharmacy.  Today she will  get injection Faslodex along with Xgeva. Return to clinic in 2 weeks for toxicity evaluation along with labs and follow-up with the loading dose of Faslodex..    No orders of the defined types were placed in this encounter.  The patient has a good understanding of the overall plan. she agrees with it. she will call with any problems that may develop before the next visit here.  Total time spent: 30 mins including face to face time and time spent for planning, charting and coordination of care  [name] Lose, MD 12/13/2019  I, Cloyde Reams Dorshimer, am acting as scribe for Dr. [name] Lose.  I have reviewed the above documentation for accuracy and completeness, and I agree with the above.

## 2019-12-13 ENCOUNTER — Ambulatory Visit (HOSPITAL_COMMUNITY): Payer: Medicare Other

## 2019-12-13 ENCOUNTER — Inpatient Hospital Stay: Payer: Medicare Other

## 2019-12-13 ENCOUNTER — Telehealth: Payer: Self-pay | Admitting: Hematology and Oncology

## 2019-12-13 ENCOUNTER — Telehealth: Payer: Self-pay

## 2019-12-13 ENCOUNTER — Inpatient Hospital Stay (HOSPITAL_BASED_OUTPATIENT_CLINIC_OR_DEPARTMENT_OTHER): Payer: Medicare Other | Admitting: Hematology and Oncology

## 2019-12-13 ENCOUNTER — Other Ambulatory Visit: Payer: Self-pay

## 2019-12-13 ENCOUNTER — Other Ambulatory Visit: Payer: Self-pay | Admitting: Hematology and Oncology

## 2019-12-13 VITALS — BP 140/72 | HR 76 | Temp 98.7°F | Resp 19 | Ht 68.0 in | Wt 147.6 lb

## 2019-12-13 DIAGNOSIS — C7801 Secondary malignant neoplasm of right lung: Secondary | ICD-10-CM | POA: Diagnosis not present

## 2019-12-13 DIAGNOSIS — Z170 Estrogen receptor positive status [ER+]: Secondary | ICD-10-CM

## 2019-12-13 DIAGNOSIS — C50412 Malignant neoplasm of upper-outer quadrant of left female breast: Secondary | ICD-10-CM

## 2019-12-13 DIAGNOSIS — C787 Secondary malignant neoplasm of liver and intrahepatic bile duct: Secondary | ICD-10-CM

## 2019-12-13 DIAGNOSIS — C7800 Secondary malignant neoplasm of unspecified lung: Secondary | ICD-10-CM

## 2019-12-13 DIAGNOSIS — C7802 Secondary malignant neoplasm of left lung: Secondary | ICD-10-CM | POA: Diagnosis not present

## 2019-12-13 DIAGNOSIS — C779 Secondary and unspecified malignant neoplasm of lymph node, unspecified: Secondary | ICD-10-CM | POA: Diagnosis not present

## 2019-12-13 DIAGNOSIS — Z5111 Encounter for antineoplastic chemotherapy: Secondary | ICD-10-CM | POA: Diagnosis not present

## 2019-12-13 MED ORDER — DENOSUMAB 120 MG/1.7ML ~~LOC~~ SOLN
120.0000 mg | Freq: Once | SUBCUTANEOUS | Status: AC
  Administered 2019-12-13: 120 mg via SUBCUTANEOUS

## 2019-12-13 MED ORDER — ALPELISIB (300 MG DAILY DOSE) 2 X 150 MG PO TBPK: ORAL_TABLET | ORAL | 3 refills | Status: DC

## 2019-12-13 MED ORDER — FULVESTRANT 250 MG/5ML IM SOLN
INTRAMUSCULAR | Status: AC
  Filled 2019-12-13: qty 5

## 2019-12-13 MED ORDER — DENOSUMAB 120 MG/1.7ML ~~LOC~~ SOLN
SUBCUTANEOUS | Status: AC
  Filled 2019-12-13: qty 1.7

## 2019-12-13 MED ORDER — FULVESTRANT 250 MG/5ML IM SOLN
500.0000 mg | Freq: Once | INTRAMUSCULAR | Status: AC
  Administered 2019-12-13: 10:00:00 500 mg via INTRAMUSCULAR

## 2019-12-13 MED FILL — POTASSIUM CHLORIDE CRYS ER: 20 | 30 days supply | Qty: 30 | Fill #0

## 2019-12-13 NOTE — Patient Instructions (Addendum)
Fulvestrant injection What is this medicine? FULVESTRANT (ful VES trant) blocks the effects of estrogen. It is used to treat breast cancer. This medicine may be used for other purposes; ask your health care provider or pharmacist if you have questions. COMMON BRAND NAME(S): FASLODEX What should I tell my health care provider before I take this medicine? They need to know if you have any of these conditions:  bleeding disorders  liver disease  low blood counts, like low white cell, platelet, or red cell counts  an unusual or allergic reaction to fulvestrant, other medicines, foods, dyes, or preservatives  pregnant or trying to get pregnant  breast-feeding How should I use this medicine? This medicine is for injection into a muscle. It is usually given by a health care professional in a hospital or clinic setting. Talk to your pediatrician regarding the use of this medicine in children. Special care may be needed. Overdosage: If you think you have taken too much of this medicine contact a poison control center or emergency room at once. NOTE: This medicine is only for you. Do not share this medicine with others. What if I miss a dose? It is important not to miss your dose. Call your doctor or health care professional if you are unable to keep an appointment. What may interact with this medicine?  medicines that treat or prevent blood clots like warfarin, enoxaparin, dalteparin, apixaban, dabigatran, and rivaroxaban This list may not describe all possible interactions. Give your health care provider a list of all the medicines, herbs, non-prescription drugs, or dietary supplements you use. Also tell them if you smoke, drink alcohol, or use illegal drugs. Some items may interact with your medicine. What should I watch for while using this medicine? Your condition will be monitored carefully while you are receiving this medicine. You will need important blood work done while you are taking  this medicine. Do not become pregnant while taking this medicine or for at least 1 year after stopping it. Women of child-bearing potential will need to have a negative pregnancy test before starting this medicine. Women should inform their doctor if they wish to become pregnant or think they might be pregnant. There is a potential for serious side effects to an unborn child. Men should inform their doctors if they wish to father a child. This medicine may lower sperm counts. Talk to your health care professional or pharmacist for more information. Do not breast-feed an infant while taking this medicine or for 1 year after the last dose. What side effects may I notice from receiving this medicine? Side effects that you should report to your doctor or health care professional as soon as possible:  allergic reactions like skin rash, itching or hives, swelling of the face, lips, or tongue  feeling faint or lightheaded, falls  pain, tingling, numbness, or weakness in the legs  signs and symptoms of infection like fever or chills; cough; flu-like symptoms; sore throat  vaginal bleeding Side effects that usually do not require medical attention (report to your doctor or health care professional if they continue or are bothersome):  aches, pains  constipation  diarrhea  headache  hot flashes  nausea, vomiting  pain at site where injected  stomach pain This list may not describe all possible side effects. Call your doctor for medical advice about side effects. You may report side effects to FDA at 1-800-FDA-1088. Where should I keep my medicine? This drug is given in a hospital or clinic and will  not be stored at home. NOTE: This sheet is a summary. It may not cover all possible information. If you have questions about this medicine, talk to your doctor, pharmacist, or health care provider.  2020 Elsevier/Gold Standard (2018-02-19 11:34:41) Denosumab injection What is this  medicine? DENOSUMAB (den oh sue mab) slows bone breakdown. Prolia is used to treat osteoporosis in women after menopause and in men, and in people who are taking corticosteroids for 6 months or more. Delton See is used to treat a high calcium level due to cancer and to prevent bone fractures and other bone problems caused by multiple myeloma or cancer bone metastases. Delton See is also used to treat giant cell tumor of the bone. This medicine may be used for other purposes; ask your health care provider or pharmacist if you have questions. COMMON BRAND NAME(S): Prolia, XGEVA What should I tell my health care provider before I take this medicine? They need to know if you have any of these conditions:  dental disease  having surgery or tooth extraction  infection  kidney disease  low levels of calcium or Vitamin D in the blood  malnutrition  on hemodialysis  skin conditions or sensitivity  thyroid or parathyroid disease  an unusual reaction to denosumab, other medicines, foods, dyes, or preservatives  pregnant or trying to get pregnant  breast-feeding How should I use this medicine? This medicine is for injection under the skin. It is given by a health care professional in a hospital or clinic setting. A special MedGuide will be given to you before each treatment. Be sure to read this information carefully each time. For Prolia, talk to your pediatrician regarding the use of this medicine in children. Special care may be needed. For Delton See, talk to your pediatrician regarding the use of this medicine in children. While this drug may be prescribed for children as young as 13 years for selected conditions, precautions do apply. Overdosage: If you think you have taken too much of this medicine contact a poison control center or emergency room at once. NOTE: This medicine is only for you. Do not share this medicine with others. What if I miss a dose? It is important not to miss your dose. Call  your doctor or health care professional if you are unable to keep an appointment. What may interact with this medicine? Do not take this medicine with any of the following medications:  other medicines containing denosumab This medicine may also interact with the following medications:  medicines that lower your chance of fighting infection  steroid medicines like prednisone or cortisone This list may not describe all possible interactions. Give your health care provider a list of all the medicines, herbs, non-prescription drugs, or dietary supplements you use. Also tell them if you smoke, drink alcohol, or use illegal drugs. Some items may interact with your medicine. What should I watch for while using this medicine? Visit your doctor or health care professional for regular checks on your progress. Your doctor or health care professional may order blood tests and other tests to see how you are doing. Call your doctor or health care professional for advice if you get a fever, chills or sore throat, or other symptoms of a cold or flu. Do not treat yourself. This drug may decrease your body's ability to fight infection. Try to avoid being around people who are sick. You should make sure you get enough calcium and vitamin D while you are taking this medicine, unless your doctor tells  you not to. Discuss the foods you eat and the vitamins you take with your health care professional. See your dentist regularly. Brush and floss your teeth as directed. Before you have any dental work done, tell your dentist you are receiving this medicine. Do not become pregnant while taking this medicine or for 5 months after stopping it. Talk with your doctor or health care professional about your birth control options while taking this medicine. Women should inform their doctor if they wish to become pregnant or think they might be pregnant. There is a potential for serious side effects to an unborn child. Talk to your  health care professional or pharmacist for more information. What side effects may I notice from receiving this medicine? Side effects that you should report to your doctor or health care professional as soon as possible:  allergic reactions like skin rash, itching or hives, swelling of the face, lips, or tongue  bone pain  breathing problems  dizziness  jaw pain, especially after dental work  redness, blistering, peeling of the skin  signs and symptoms of infection like fever or chills; cough; sore throat; pain or trouble passing urine  signs of low calcium like fast heartbeat, muscle cramps or muscle pain; pain, tingling, numbness in the hands or feet; seizures  unusual bleeding or bruising  unusually weak or tired Side effects that usually do not require medical attention (report to your doctor or health care professional if they continue or are bothersome):  constipation  diarrhea  headache  joint pain  loss of appetite  muscle pain  runny nose  tiredness  upset stomach This list may not describe all possible side effects. Call your doctor for medical advice about side effects. You may report side effects to FDA at 1-800-FDA-1088. Where should I keep my medicine? This medicine is only given in a clinic, doctor's office, or other health care setting and will not be stored at home. NOTE: This sheet is a summary. It may not cover all possible information. If you have questions about this medicine, talk to your doctor, pharmacist, or health care provider.  2020 Elsevier/Gold Standard (2018-03-20 16:10:44)  

## 2019-12-13 NOTE — Telephone Encounter (Signed)
Oral Oncology Patient Advocate Encounter  Received notification from [facility] [place] that prior authorization for Piqray is required.  PA submitted on CoverMyMeds Key [id number] Status is pending  Oral Oncology Clinic will continue to follow.  [place] Patient [place] Phone [phone] Fax [phone] 12/13/2019 11:51 AM

## 2019-12-13 NOTE — Telephone Encounter (Signed)
I talk with patient regarding schedule  

## 2019-12-13 NOTE — Assessment & Plan Note (Signed)
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 She had radiation therapy and has been on antiestrogen therapy with Arimidex 06/28/2014-January 2019  Thoracentesis: Met Breast cancer ER PR Positive and Her 2 Neg PET/CT scan. 01/04/18: New malignant Pleural effusion with small bil pulm nodules,new mild bilateral mediastinal hilar portocaval lymph node metastases, new liver metastases, new diffuse bone metastases.  Ibrance with Faslodex 12/29/2017-02/27/2019 [facility] molecular testing: PIK3CA mutation present, androgen receptor positive, negative for BRCA1, 2 and PDL 1. 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  Smaller lesions also slightly increased.  5 mm to 10 mm, left hepatic lobe 18 mm to 23 mm.  Enlarged periportal lymph node 12 mm to 16 mm.  Bone lesions have not progressed.  Recommendation: Change in treatment to alpelisib with Faslodex Alpelisib (Piqray) Counseling: Alpelisib was studied in SOLAR-1 clinical trial: 093 patients postmenopausal woman who progressed on hormonal therapy were randomized to Fulvestrant + Alpelisib vs Fulvestrant. PFS was 11 months versus 5.7 months in patients who had PIK3CA mutation. Common side effects of Piqray  are high blood sugar levels, increase in creatinine, diarrhea, rash, decrease in lymphocyte count, elevated liver enzymes, nausea, fatigue, anemia, increase in lipase, decreased appetite, stomatitis, vomiting, weight loss, low calcium levels, aPTT prolonged, and hair loss.  Return to clinic in 2 weeks for toxicity evaluation.

## 2019-12-13 NOTE — Telephone Encounter (Signed)
Oral Oncology Patient Advocate Encounter  Prior Authorization for Wendy [name] has been approved.    PA# [id number] Effective dates: 12/13/19 through 12/12/20  Patients co-pay is $3077.19  Oral Oncology Clinic will continue to follow.   [name] Phone [phone] Fax [phone] 12/13/2019 3:19 PM

## 2019-12-14 ENCOUNTER — Telehealth: Payer: Self-pay | Admitting: Pharmacist

## 2019-12-14 ENCOUNTER — Telehealth: Payer: Self-pay

## 2019-12-14 NOTE — Telephone Encounter (Signed)
Oral Oncology Patient Advocate Encounter  Completed online application for [employer] ([employer]) in an effort to reduce the patient's out of pocket expense for Piqray to $0.    Application completed and faxed to [phone].   [employer] phone number for follow up is [phone].   This encounter will be updated until final determination.   [place] Patient [place] Phone [phone] Fax [phone] 12/14/2019 1:34 PM

## 2019-12-14 NOTE — Telephone Encounter (Signed)
Oral Oncology Pharmacist Encounter  Received new prescription for Piqray (alpelisib) for the treatment of metastatic breast cancer, PIK3CA mutation positive in conjunction with fulvestrant, planned duration until disease progression or unacceptable drug toxicity.  CMP from 12/09/19 assessed, no relevant lab abnormalities. Prescription dose and frequency assessed.   Current medication list in Epic reviewed, no DDIs with alpelisib identified.  Prescription has been e-scribed to the [facility] for benefits analysis and approval.  Oral Oncology Clinic will continue to follow for insurance authorization, copayment issues, initial counseling and start date.  Darl Pikes, PharmD, BCPS, [facility] Hematology/Oncology Clinical Pharmacist [facility] [place] [facility] [phone]  12/14/2019 10:11 AM

## 2019-12-26 NOTE — Progress Notes (Signed)
Patient Care Team: Delorse Limber as PCP - General (Family Medicine)  DIAGNOSIS:    ICD-10-CM   1. Malignant neoplasm of upper-outer quadrant of left breast in female, estrogen receptor positive ([place])  C50.412    Z17.0     SUMMARY OF ONCOLOGIC HISTORY: Oncology History  Malignant neoplasm of upper-outer quadrant of left breast in female, estrogen receptor positive ([place])  07/28/2013 Mammogram   Spiculated mass at 6:00 position 3 cm, ultrasound revealed 2.6 x 2.1 x 1.5 cm second mass at 12:00 2.1 x 1.8 x 2.1 cm: 2 abnormal lymph nodes left axilla   10/29/2013 Initial Diagnosis   Breast cancer of upper-outer quadrant of left female breast: 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 [name], T3N2 (5/10 LN positive) stage IIIa IDC grade 1, ER/PR 100%, TSH 14% HER-2 negative PET/CT 12/20/2013 negative for metastases   12/17/2013 - 04/02/2014 Chemotherapy   Taxotere Cytoxan every 3 weeks for 6 cycles   05/11/2014 - 06/27/2014 Radiation Therapy   Adjuvant radiation therapy to chest wall and lymph nodes   07/06/2014 -  Anti-estrogen oral therapy   Arimidex 1 mg daily   12/21/2017 Imaging   Metastatic lesions noted in the lungs bilaterally, bilateral hilar and mediastinal lymph nodes, left pleural effusion, multiple thoracic vertebral metastases, heterogeneous lesions in the liver   12/22/2017 Procedure   Pleural Effusion Thoracentesis: Metastatic Breast cancer ER PR Positive Her 2 Neg   12/29/2017 Treatment Plan Change   Ibrance, Faslodex and Xgeva   03/09/2019 Treatment Plan Change   Everolimus and Exemestane   06/25/2019 Pathology Results   Liver biopsy: 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 with above-mentioned history of  metastatic breast cancer currently on treatment withalpelisib, Faslodexand Delton See. She presents to the clinic today for a toxicity check.   ALLERGIES:  has No Known Allergies.  MEDICATIONS:  Current Outpatient Medications  Medication Sig Dispense Refill  . acetaminophen (TYLENOL) 500 MG tablet Take 1,000 mg by mouth every 6 (six) hours as needed for mild pain.    [name] alpelisib (PIQRAY 300MG DAILY DOSE) 2 x 150 MG Therapy Pack Take two 138m tablets with food at the same time daily. Swallow whole, do not crush, chew, or split. 60 each 3  . atorvastatin (LIPITOR) 20 MG tablet Take 1 tablet (20 mg total) by mouth at bedtime. 90 tablet 3  . Calcium Carb-Cholecalciferol (CALCIUM 600 + D PO) Take 1 tablet by mouth daily.    .Marland Kitchendexamethasone (DECADRON) 1 MG tablet TAKE 1 TABLET BY MOUTH EVERY DAY 30 tablet 3  . exemestane (AROMASIN) 25 MG tablet Take 1 tablet (25 mg total) by mouth daily after breakfast. 90 tablet 3  . ferrous sulfate (SLOW IRON) 160 (50 Fe) MG TBCR SR tablet Take 1 tablet (160 mg total) by mouth daily. 30 tablet 0  . hydrochlorothiazide (MICROZIDE) 12.5 MG capsule TAKE 1 CAPSULE BY MOUTH EVERY DAY 90 capsule 1  . Multiple Vitamin (MULTIVITAMIN WITH MINERALS) TABS tablet Take 1 tablet by mouth daily.    . nitroGLYCERIN (NITROSTAT) 0.4 MG SL tablet PLACE 1 TABLET (0.4 MG TOTAL) UNDER THE TONGUE EVERY 5 (FIVE) MINUTES AS NEEDED FOR CHEST PAIN. 25 tablet 0  . omeprazole (PRILOSEC) 40  MG capsule Take 1 capsule (40 mg total) by mouth 2 (two) times daily. 60 capsule 0  . ondansetron (ZOFRAN ODT) 8 MG disintegrating tablet Take 1 tablet (8 mg total) by mouth every 8 (eight) hours as needed for nausea or vomiting. 30 tablet 3  . potassium chloride SA (KLOR-CON) 20 MEQ tablet TAKE 1 TABLET (20 MEQ TOTAL) BY MOUTH DAILY. 30 tablet 3   No current facility-administered medications for this visit.    PHYSICAL EXAMINATION: ECOG PERFORMANCE STATUS: 1 - Symptomatic but completely ambulatory  There  were no vitals filed for this visit. There were no vitals filed for this visit.  LABORATORY DATA:  I have reviewed the data as listed CMP Latest Ref Rng & Units 12/09/2019 09/08/2019 08/30/2019  Glucose 70 - 99 mg/dL 84 93 105(H)  BUN 8 - 23 mg/dL _0 Creatinine 0.44 - 1.00 mg/dL 1.09(H) 1.14(H) 1.00  Sodium 135 - 145 mmol/L 143 140 139  Potassium 3.5 - 5.1 mmol/L 3.5 3.8 4.3  Chloride 98 - 111 mmol/L 103 100 102  CO2 22 - 32 mmol/L _1 Calcium 8.9 - 10.3 mg/dL 8.9 9.3 8.8  Total Protein 6.5 - 8.1 g/dL 7.2 7.4 -  Total Bilirubin 0.3 - 1.2 mg/dL 0.3 0.4 -  Alkaline Phos 38 - 126 U/L 85 104 -  AST 15 - 41 U/L 46(H) 33 -  ALT 0 - 44 U/L 22 15 -    Lab Results  Component Value Date   WBC 5.1 12/09/2019   HGB 11.1 (L) 12/09/2019   HCT 35.8 (L) 12/09/2019   MCV 90.4 12/09/2019   PLT 245 12/09/2019   NEUTROABS 3.3 12/09/2019    ASSESSMENT & PLAN:  Malignant neoplasm of upper-outer quadrant of left breast in female, estrogen receptor positive ([place]) Left breast cancer T3, N2, 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 She had radiation therapy and has been on antiestrogen therapy with Arimidex 06/28/2014-January 2019  Thoracentesis: Met Breast cancer ER PR Positive and Her 2 Neg PET/CT scan. 01/04/18: New malignant Pleural effusion with small bil pulm nodules,new mild bilateral mediastinal hilar portocaval lymph node metastases, new liver metastases, new diffuse bone metastases.  Ibrance with Faslodex 12/29/2017-02/27/2019 [facility] molecular testing: PIK3CA mutation present, androgen receptor positive, negative for BRCA1, 2 and PDL 1. --------------------------------------------------------------------------- Current treatment:Exemestane with everolimus started 03/09/2019  06/25/2019:Liver biopsy: Metastatic adenocarcinoma to the liver consistent  with breast cancer, ER 100%, PR 0%, Ki-67 10%, HER-2 -1+ by IHC I discussed the [facility] molecular testing results with the patient which showed PI K3 CA mutation as well as ER positivity.  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  Smaller lesions also slightly increased.  5 mm to 10 mm, left hepatic lobe 18 mm to 23 mm.  Enlarged periportal lymph node 12 mm to 16 mm.  Bone lesions have not progressed.  Current Treatment: Alpelisib with Faslodex  Toxicities: Injection site discomfort Labs are going to be monitored for  blood sugars. I will call her with the result of the labs. She will come back in 2 weeks for her next injection and follow-up with Mendel Ryder.  No orders of the defined types were placed in  this encounter.  The patient has a good understanding of the overall plan. she agrees with it. she will call with any problems that may develop before the next visit here.  Total time spent: 30 mins including face to face time and time spent for planning, charting and coordination of care  [name] Lose, MD 12/27/2019  I, Cloyde Reams Dorshimer, am acting as scribe for Dr. [name] Lose.  I have reviewed the above documentation for accuracy and completeness, and I agree with the above.

## 2019-12-27 ENCOUNTER — Inpatient Hospital Stay (HOSPITAL_BASED_OUTPATIENT_CLINIC_OR_DEPARTMENT_OTHER): Payer: Medicare Other | Admitting: Hematology and Oncology

## 2019-12-27 ENCOUNTER — Inpatient Hospital Stay: Payer: Medicare Other

## 2019-12-27 ENCOUNTER — Inpatient Hospital Stay: Payer: Medicare Other | Attending: Hematology and Oncology

## 2019-12-27 ENCOUNTER — Other Ambulatory Visit: Payer: Self-pay

## 2019-12-27 DIAGNOSIS — Z7952 Long term (current) use of systemic steroids: Secondary | ICD-10-CM | POA: Insufficient documentation

## 2019-12-27 DIAGNOSIS — I252 Old myocardial infarction: Secondary | ICD-10-CM | POA: Diagnosis not present

## 2019-12-27 DIAGNOSIS — G893 Neoplasm related pain (acute) (chronic): Secondary | ICD-10-CM | POA: Insufficient documentation

## 2019-12-27 DIAGNOSIS — Z87891 Personal history of nicotine dependence: Secondary | ICD-10-CM | POA: Diagnosis not present

## 2019-12-27 DIAGNOSIS — K219 Gastro-esophageal reflux disease without esophagitis: Secondary | ICD-10-CM | POA: Insufficient documentation

## 2019-12-27 DIAGNOSIS — Z170 Estrogen receptor positive status [ER+]: Secondary | ICD-10-CM

## 2019-12-27 DIAGNOSIS — C50412 Malignant neoplasm of upper-outer quadrant of left female breast: Secondary | ICD-10-CM

## 2019-12-27 DIAGNOSIS — C779 Secondary and unspecified malignant neoplasm of lymph node, unspecified: Secondary | ICD-10-CM | POA: Diagnosis not present

## 2019-12-27 DIAGNOSIS — C7802 Secondary malignant neoplasm of left lung: Secondary | ICD-10-CM | POA: Insufficient documentation

## 2019-12-27 DIAGNOSIS — Z79899 Other long term (current) drug therapy: Secondary | ICD-10-CM | POA: Diagnosis not present

## 2019-12-27 DIAGNOSIS — C787 Secondary malignant neoplasm of liver and intrahepatic bile duct: Secondary | ICD-10-CM | POA: Diagnosis not present

## 2019-12-27 DIAGNOSIS — Z79811 Long term (current) use of aromatase inhibitors: Secondary | ICD-10-CM | POA: Insufficient documentation

## 2019-12-27 DIAGNOSIS — C7801 Secondary malignant neoplasm of right lung: Secondary | ICD-10-CM | POA: Insufficient documentation

## 2019-12-27 DIAGNOSIS — Z8249 Family history of ischemic heart disease and other diseases of the circulatory system: Secondary | ICD-10-CM | POA: Diagnosis not present

## 2019-12-27 DIAGNOSIS — Z5111 Encounter for antineoplastic chemotherapy: Secondary | ICD-10-CM | POA: Insufficient documentation

## 2019-12-27 DIAGNOSIS — Z923 Personal history of irradiation: Secondary | ICD-10-CM | POA: Insufficient documentation

## 2019-12-27 DIAGNOSIS — Z853 Personal history of malignant neoplasm of breast: Secondary | ICD-10-CM | POA: Diagnosis not present

## 2019-12-27 DIAGNOSIS — C7951 Secondary malignant neoplasm of bone: Secondary | ICD-10-CM | POA: Insufficient documentation

## 2019-12-27 DIAGNOSIS — C7800 Secondary malignant neoplasm of unspecified lung: Secondary | ICD-10-CM

## 2019-12-27 DIAGNOSIS — R634 Abnormal weight loss: Secondary | ICD-10-CM | POA: Diagnosis not present

## 2019-12-27 DIAGNOSIS — E785 Hyperlipidemia, unspecified: Secondary | ICD-10-CM | POA: Insufficient documentation

## 2019-12-27 DIAGNOSIS — I10 Essential (primary) hypertension: Secondary | ICD-10-CM | POA: Diagnosis not present

## 2019-12-27 LAB — CMP (CANCER CENTER ONLY)
ALT: 24 U/L (ref 0–44)
AST: 38 U/L (ref 15–41)
Albumin: 4.2 g/dL (ref 3.5–5.0)
Alkaline Phosphatase: 96 U/L (ref 38–126)
Anion gap: 11 (ref 5–15)
BUN: 17 mg/dL (ref 8–23)
CO2: 29 mmol/L (ref 22–32)
Calcium: 9.4 mg/dL (ref 8.9–10.3)
Chloride: 99 mmol/L (ref 98–111)
Creatinine: 1.35 mg/dL — ABNORMAL HIGH (ref 0.44–1.00)
GFR, Est AFR Am: 43 mL/min — ABNORMAL LOW (ref >60)
GFR, Estimated: 38 mL/min — ABNORMAL LOW (ref >60)
Glucose, Bld: 169 mg/dL — ABNORMAL HIGH (ref 70–99)
Potassium: 3.7 mmol/L (ref 3.5–5.1)
Sodium: 139 mmol/L (ref 135–145)
Total Bilirubin: 0.3 mg/dL (ref 0.3–1.2)
Total Protein: 7.5 g/dL (ref 6.5–8.1)

## 2019-12-27 MED ORDER — FULVESTRANT 250 MG/5ML IM SOLN
500.0000 mg | Freq: Once | INTRAMUSCULAR | Status: AC
  Administered 2019-12-27: 11:00:00 500 mg via INTRAMUSCULAR

## 2019-12-27 MED ORDER — FULVESTRANT 250 MG/5ML IM SOLN
INTRAMUSCULAR | Status: AC
  Filled 2019-12-27: qty 10

## 2019-12-27 NOTE — Assessment & Plan Note (Signed)
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 She had radiation therapy and has been on antiestrogen therapy with Arimidex 06/28/2014-January 2019  Thoracentesis: Met Breast cancer ER PR Positive and Her 2 Neg PET/CT scan. 01/04/18: New malignant Pleural effusion with small bil pulm nodules,new mild bilateral mediastinal hilar portocaval lymph node metastases, new liver metastases, new diffuse bone metastases.  Ibrance with Faslodex 12/29/2017-02/27/2019 [facility] molecular testing: PIK3CA mutation present, androgen receptor positive, negative for BRCA1, 2 and PDL 1. 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  Smaller lesions also slightly increased.  5 mm to 10 mm, left hepatic lobe 18 mm to 23 mm.  Enlarged periportal lymph node 12 mm to 16 mm.  Bone lesions have not progressed.  Current Treatment: Alpelisib with Faslodex

## 2019-12-27 NOTE — Telephone Encounter (Signed)
Patient is approved for assistance through [employer] for [facility] 12/25/19-11/24/20.  [employer] phone [phone]  [place] Patient [place] Phone [phone] Fax [phone] 12/27/2019 9:42 AM

## 2019-12-27 NOTE — Patient Instructions (Signed)
Fulvestrant injection What is this medicine? FULVESTRANT (ful VES trant) blocks the effects of estrogen. It is used to treat breast cancer. This medicine may be used for other purposes; ask your health care provider or pharmacist if you have questions. COMMON BRAND NAME(S): FASLODEX What should I tell my health care provider before I take this medicine? They need to know if you have any of these conditions:  bleeding disorders  liver disease  low blood counts, like low white cell, platelet, or red cell counts  an unusual or allergic reaction to fulvestrant, other medicines, foods, dyes, or preservatives  pregnant or trying to get pregnant  breast-feeding How should I use this medicine? This medicine is for injection into a muscle. It is usually given by a health care professional in a hospital or clinic setting. Talk to your pediatrician regarding the use of this medicine in children. Special care may be needed. Overdosage: If you think you have taken too much of this medicine contact a poison control center or emergency room at once. NOTE: This medicine is only for you. Do not share this medicine with others. What if I miss a dose? It is important not to miss your dose. Call your doctor or health care professional if you are unable to keep an appointment. What may interact with this medicine?  medicines that treat or prevent blood clots like warfarin, enoxaparin, dalteparin, apixaban, dabigatran, and rivaroxaban This list may not describe all possible interactions. Give your health care provider a list of all the medicines, herbs, non-prescription drugs, or dietary supplements you use. Also tell them if you smoke, drink alcohol, or use illegal drugs. Some items may interact with your medicine. What should I watch for while using this medicine? Your condition will be monitored carefully while you are receiving this medicine. You will need important blood work done while you are taking  this medicine. Do not become pregnant while taking this medicine or for at least 1 year after stopping it. Women of child-bearing potential will need to have a negative pregnancy test before starting this medicine. Women should inform their doctor if they wish to become pregnant or think they might be pregnant. There is a potential for serious side effects to an unborn child. Men should inform their doctors if they wish to father a child. This medicine may lower sperm counts. Talk to your health care professional or pharmacist for more information. Do not breast-feed an infant while taking this medicine or for 1 year after the last dose. What side effects may I notice from receiving this medicine? Side effects that you should report to your doctor or health care professional as soon as possible:  allergic reactions like skin rash, itching or hives, swelling of the face, lips, or tongue  feeling faint or lightheaded, falls  pain, tingling, numbness, or weakness in the legs  signs and symptoms of infection like fever or chills; cough; flu-like symptoms; sore throat  vaginal bleeding Side effects that usually do not require medical attention (report to your doctor or health care professional if they continue or are bothersome):  aches, pains  constipation  diarrhea  headache  hot flashes  nausea, vomiting  pain at site where injected  stomach pain This list may not describe all possible side effects. Call your doctor for medical advice about side effects. You may report side effects to FDA at 1-800-FDA-1088. Where should I keep my medicine? This drug is given in a hospital or clinic and will  not be stored at home. NOTE: This sheet is a summary. It may not cover all possible information. If you have questions about this medicine, talk to your doctor, pharmacist, or health care provider.  2020 [employer]/Gold Standard (2018-02-19 11:34:41) Denosumab injection What is this  medicine? DENOSUMAB (den oh sue mab) slows bone breakdown. Prolia is used to treat osteoporosis in women after menopause and in men, and in people who are taking corticosteroids for 6 months or more. Delton See is used to treat a high calcium level due to cancer and to prevent bone fractures and other bone problems caused by multiple myeloma or cancer bone metastases. Delton See is also used to treat giant cell tumor of the bone. This medicine may be used for other purposes; ask your health care provider or pharmacist if you have questions. COMMON BRAND NAME(S): Prolia, XGEVA What should I tell my health care provider before I take this medicine? They need to know if you have any of these conditions:  dental disease  having surgery or tooth extraction  infection  kidney disease  low levels of calcium or Vitamin D in the blood  malnutrition  on hemodialysis  skin conditions or sensitivity  thyroid or parathyroid disease  an unusual reaction to denosumab, other medicines, foods, dyes, or preservatives  pregnant or trying to get pregnant  breast-feeding How should I use this medicine? This medicine is for injection under the skin. It is given by a health care professional in a hospital or clinic setting. A special MedGuide will be given to you before each treatment. Be sure to read this information carefully each time. For Prolia, talk to your pediatrician regarding the use of this medicine in children. Special care may be needed. For Delton See, talk to your pediatrician regarding the use of this medicine in children. While this drug may be prescribed for children as young as 13 years for selected conditions, precautions do apply. Overdosage: If you think you have taken too much of this medicine contact a poison control center or emergency room at once. NOTE: This medicine is only for you. Do not share this medicine with others. What if I miss a dose? It is important not to miss your dose. Call  your doctor or health care professional if you are unable to keep an appointment. What may interact with this medicine? Do not take this medicine with any of the following medications:  other medicines containing denosumab This medicine may also interact with the following medications:  medicines that lower your chance of fighting infection  steroid medicines like prednisone or cortisone This list may not describe all possible interactions. Give your health care provider a list of all the medicines, herbs, non-prescription drugs, or dietary supplements you use. Also tell them if you smoke, drink alcohol, or use illegal drugs. Some items may interact with your medicine. What should I watch for while using this medicine? Visit your doctor or health care professional for regular checks on your progress. Your doctor or health care professional may order blood tests and other tests to see how you are doing. Call your doctor or health care professional for advice if you get a fever, chills or sore throat, or other symptoms of a cold or flu. Do not treat yourself. This drug may decrease your body's ability to fight infection. Try to avoid being around people who are sick. You should make sure you get enough calcium and vitamin D while you are taking this medicine, unless your doctor tells  you not to. Discuss the foods you eat and the vitamins you take with your health care professional. See your dentist regularly. Brush and floss your teeth as directed. Before you have any dental work done, tell your dentist you are receiving this medicine. Do not become pregnant while taking this medicine or for 5 months after stopping it. Talk with your doctor or health care professional about your birth control options while taking this medicine. Women should inform their doctor if they wish to become pregnant or think they might be pregnant. There is a potential for serious side effects to an unborn child. Talk to your  health care professional or pharmacist for more information. What side effects may I notice from receiving this medicine? Side effects that you should report to your doctor or health care professional as soon as possible:  allergic reactions like skin rash, itching or hives, swelling of the face, lips, or tongue  bone pain  breathing problems  dizziness  jaw pain, especially after dental work  redness, blistering, peeling of the skin  signs and symptoms of infection like fever or chills; cough; sore throat; pain or trouble passing urine  signs of low calcium like fast heartbeat, muscle cramps or muscle pain; pain, tingling, numbness in the hands or feet; seizures  unusual bleeding or bruising  unusually weak or tired Side effects that usually do not require medical attention (report to your doctor or health care professional if they continue or are bothersome):  constipation  diarrhea  headache  joint pain  loss of appetite  muscle pain  runny nose  tiredness  upset stomach This list may not describe all possible side effects. Call your doctor for medical advice about side effects. You may report side effects to FDA at 1-800-FDA-1088. Where should I keep my medicine? This medicine is only given in a clinic, doctor's office, or other health care setting and will not be stored at home. NOTE: This sheet is a summary. It may not cover all possible information. If you have questions about this medicine, talk to your doctor, pharmacist, or health care provider.  2020 [employer]/Gold Standard (2018-03-20 16:10:44)  

## 2020-01-06 ENCOUNTER — Other Ambulatory Visit: Payer: Self-pay | Admitting: Adult Health

## 2020-01-06 DIAGNOSIS — C50412 Malignant neoplasm of upper-outer quadrant of left female breast: Secondary | ICD-10-CM

## 2020-01-06 DIAGNOSIS — C7951 Secondary malignant neoplasm of bone: Secondary | ICD-10-CM

## 2020-01-06 DIAGNOSIS — C7800 Secondary malignant neoplasm of unspecified lung: Secondary | ICD-10-CM

## 2020-01-06 DIAGNOSIS — C787 Secondary malignant neoplasm of liver and intrahepatic bile duct: Secondary | ICD-10-CM

## 2020-01-10 ENCOUNTER — Inpatient Hospital Stay: Payer: Medicare Other

## 2020-01-10 ENCOUNTER — Telehealth: Payer: Self-pay | Admitting: *Deleted

## 2020-01-10 ENCOUNTER — Encounter: Payer: Self-pay | Admitting: Adult Health

## 2020-01-10 ENCOUNTER — Other Ambulatory Visit: Payer: Self-pay

## 2020-01-10 ENCOUNTER — Inpatient Hospital Stay (HOSPITAL_BASED_OUTPATIENT_CLINIC_OR_DEPARTMENT_OTHER): Payer: Medicare Other | Admitting: Adult Health

## 2020-01-10 ENCOUNTER — Telehealth: Payer: Self-pay | Admitting: Hematology and Oncology

## 2020-01-10 ENCOUNTER — Ambulatory Visit: Payer: Medicare Other

## 2020-01-10 VITALS — BP 145/61 | HR 65 | Temp 98.2°F | Resp 16 | Ht 68.0 in | Wt 142.5 lb

## 2020-01-10 DIAGNOSIS — C779 Secondary and unspecified malignant neoplasm of lymph node, unspecified: Secondary | ICD-10-CM | POA: Diagnosis not present

## 2020-01-10 DIAGNOSIS — C50412 Malignant neoplasm of upper-outer quadrant of left female breast: Secondary | ICD-10-CM

## 2020-01-10 DIAGNOSIS — Z5111 Encounter for antineoplastic chemotherapy: Secondary | ICD-10-CM | POA: Diagnosis not present

## 2020-01-10 DIAGNOSIS — C7800 Secondary malignant neoplasm of unspecified lung: Secondary | ICD-10-CM

## 2020-01-10 DIAGNOSIS — C787 Secondary malignant neoplasm of liver and intrahepatic bile duct: Secondary | ICD-10-CM | POA: Diagnosis not present

## 2020-01-10 DIAGNOSIS — C7802 Secondary malignant neoplasm of left lung: Secondary | ICD-10-CM | POA: Diagnosis not present

## 2020-01-10 DIAGNOSIS — C7801 Secondary malignant neoplasm of right lung: Secondary | ICD-10-CM | POA: Diagnosis not present

## 2020-01-10 DIAGNOSIS — Z170 Estrogen receptor positive status [ER+]: Secondary | ICD-10-CM

## 2020-01-10 DIAGNOSIS — C7951 Secondary malignant neoplasm of bone: Secondary | ICD-10-CM | POA: Diagnosis not present

## 2020-01-10 LAB — CMP (CANCER CENTER ONLY)
ALT: 21 U/L (ref 0–44)
AST: 32 U/L (ref 15–41)
Albumin: 4.1 g/dL (ref 3.5–5.0)
Alkaline Phosphatase: 72 U/L (ref 38–126)
Anion gap: 10 (ref 5–15)
BUN: 16 mg/dL (ref 8–23)
CO2: 30 mmol/L (ref 22–32)
Calcium: 9.3 mg/dL (ref 8.9–10.3)
Chloride: 100 mmol/L (ref 98–111)
Creatinine: 1.48 mg/dL — ABNORMAL HIGH (ref 0.44–1.00)
GFR, Est AFR Am: 39 mL/min — ABNORMAL LOW (ref >60)
GFR, Estimated: 34 mL/min — ABNORMAL LOW (ref >60)
Glucose, Bld: 137 mg/dL — ABNORMAL HIGH (ref 70–99)
Potassium: 3.4 mmol/L — ABNORMAL LOW (ref 3.5–5.1)
Sodium: 140 mmol/L (ref 135–145)
Total Bilirubin: 0.4 mg/dL (ref 0.3–1.2)
Total Protein: 7.2 g/dL (ref 6.5–8.1)

## 2020-01-10 LAB — CBC WITH DIFFERENTIAL (CANCER CENTER ONLY)
Abs Immature Granulocytes: 0.03 10*3/uL (ref 0.00–0.07)
Basophils Absolute: 0.1 10*3/uL (ref 0.0–0.1)
Basophils Relative: 2 %
Eosinophils Absolute: 0.3 10*3/uL (ref 0.0–0.5)
Eosinophils Relative: 6 %
HCT: 40.1 % (ref 36.0–46.0)
Hemoglobin: 12.6 g/dL (ref 12.0–15.0)
Immature Granulocytes: 1 %
Lymphocytes Relative: 30 %
Lymphs Abs: 1.7 10*3/uL (ref 0.7–4.0)
MCH: 28.2 pg (ref 26.0–34.0)
MCHC: 31.4 g/dL (ref 30.0–36.0)
MCV: 89.7 fL (ref 80.0–100.0)
Monocytes Absolute: 0.7 10*3/uL (ref 0.1–1.0)
Monocytes Relative: 13 %
Neutro Abs: 2.9 10*3/uL (ref 1.7–7.7)
Neutrophils Relative %: 48 %
Platelet Count: 243 10*3/uL (ref 150–400)
RBC: 4.47 MIL/uL (ref 3.87–5.11)
RDW: 18.1 % — ABNORMAL HIGH (ref 11.5–15.5)
WBC Count: 5.8 10*3/uL (ref 4.0–10.5)
nRBC: 0 % (ref 0.0–0.2)

## 2020-01-10 MED ORDER — FULVESTRANT 250 MG/5ML IM SOLN
500.0000 mg | Freq: Once | INTRAMUSCULAR | Status: AC
  Administered 2020-01-10: 09:00:00 500 mg via INTRAMUSCULAR

## 2020-01-10 MED ORDER — FULVESTRANT 250 MG/5ML IM SOLN
INTRAMUSCULAR | Status: AC
  Filled 2020-01-10: qty 5

## 2020-01-10 NOTE — Telephone Encounter (Signed)
I left a message for patient regarding schedule

## 2020-01-10 NOTE — Telephone Encounter (Signed)
Per Wilber Bihari, NP, called to make pt aware that fluid and potassium intake needs to be increased. Advised pt on drinking at least 60 oz of non caffeinated fluids in addition to 8 oz per bowel movement. Pt verbalized understanding.

## 2020-01-10 NOTE — Progress Notes (Signed)
[facility] Follow up:    Wendy Jeans, [name] [address] [name] [id number]   DIAGNOSIS: Cancer Staging Malignant neoplasm of upper-outer quadrant of left breast in female, estrogen receptor positive ([place]) Staging form: Breast, AJCC 7th Edition - Clinical: Stage IIB (T2, N1, cM0) - Signed by Thea Silversmith, MD on 11/04/2013 - Pathologic: Stage IV (M1) - Unsigned   SUMMARY OF ONCOLOGIC HISTORY: Oncology History  Malignant neoplasm of upper-outer quadrant of left breast in female, estrogen receptor positive ([place])  07/28/2013 Mammogram   Spiculated mass at 6:00 position 3 cm, ultrasound revealed 2.6 x 2.1 x 1.5 cm second mass at 12:00 2.1 x 1.8 x 2.1 cm: 2 abnormal lymph nodes left axilla   10/29/2013 Initial Diagnosis   Breast cancer of upper-outer quadrant of left female breast: 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 [name], T3N2 (5/10 LN positive) stage IIIa IDC grade 1, ER/PR 100%, TSH 14% HER-2 negative PET/CT 12/20/2013 negative for metastases   12/17/2013 - 04/02/2014 Chemotherapy   Taxotere Cytoxan every 3 weeks for 6 cycles   05/11/2014 - 06/27/2014 Radiation Therapy   Adjuvant radiation therapy to chest wall and lymph nodes   07/06/2014 -  Anti-estrogen oral therapy   Arimidex 1 mg daily   12/21/2017 Imaging   Metastatic lesions noted in the lungs bilaterally, bilateral hilar and mediastinal lymph nodes, left pleural effusion, multiple thoracic vertebral metastases, heterogeneous lesions in the liver   12/22/2017 Procedure   Pleural Effusion Thoracentesis: Metastatic Breast cancer ER PR Positive Her 2 Neg   12/29/2017 Treatment Plan Change   Ibrance, Faslodex and Xgeva   03/09/2019 Treatment Plan Change   Everolimus and Exemestane   06/25/2019 Pathology Results   Liver biopsy: 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, ER 100%, PR 0%,  Ki-67 10%, HER-2 -1+ by IHC     CURRENT THERAPY: Alpelisib and Faslodex/Xgeva  INTERVAL HISTORY: Wendy [name] 79 y.o. female returns for evaluation of her metastatic breast cancer.  She is taking Alpesilib daily.  She has been taking this for about one month.  She is feeling moderately well since starting the Alpelisib.  She has had decreased appetite, and doesn't feel like eating many days.  She says she is eating normally, and often will have toast or bacon/eggs in the morning.  She notes breakfast is her best meal of the day, and she will eat lunch about 1-2 and then have a snack for dinner.  She is down 5 pounds since 12/13/19.  She has been watching her sugar intake since an adverse effect of her new medication is hyperglycemia.    She is reporting increasing bms to 3-4 per day.  These are soft, non watery, and not particularly bothersome.    Wendy [name]'s most recent imaging was on 12/10/2019.  She receives [place] every 12 weeks and is due for this again in April.  She tolerates this wella nd has no dental concerns.  Wendy [name] has no current pain, other than occasional back or liver pain.  Her pain is relieved by teylenol.     Patient Active Problem List   Diagnosis Date Noted  . Hypotension due to hypovolemia   . Acute blood loss anemia   . Elevated troponin   . Demand ischemia ([place])   . Gastroesophageal reflux disease   . Metastatic breast cancer ([place])   . Hypokalemia   .  Symptomatic anemia 08/16/2019  . Thrombocytopenia ([place]) 12/01/2018  . Liver metastases ([place]) 11/02/2018  . Acute diastolic CHF (congestive heart failure) ([place]) 09/01/2018  . Pleural effusion 09/01/2018  . Bone metastases ([place]) 05/27/2018  . Metastatic cancer to lung ([place]) 12/22/2017  . Streptococcal bacteremia 09/24/2017  . CKD (chronic kidney disease), stage III 09/22/2017  . Sepsis ([place]) 09/22/2017  . Lower urinary tract infectious disease 09/22/2017  . Chest pain 09/22/2017  . Left arm cellulitis 09/22/2017   . Obesity (BMI 30-39.9) 01/06/2014  . Hyperlipidemia 01/06/2014  . CAD S/P percutaneous coronary angioplasty   . Essential hypertension   . Carotid artery disease ([place])   . Malignant neoplasm of upper-outer quadrant of left breast in female, estrogen receptor positive ([place]) 10/29/2013    has No Known Allergies.  MEDICAL HISTORY: Past Medical History:  Diagnosis Date  . Arthritis   . Breast cancer dx'd 2014   left breast  . CAD (coronary artery disease)   . Hyperlipidemia   . Hypertension   . Metastasis ([place]) dx'd 12/21/17   lung, bones and liver  . Myocardial infarction ([place]) 11/22/04  . Peripheral vascular disease ([place])    atherosclerosis carotid artery-mild    SURGICAL HISTORY: Past Surgical History:  Procedure Laterality Date  . ABDOMINAL HYSTERECTOMY  1982  . CATARACT EXTRACTION W/PHACO Right 11/13/2018   Procedure: CATARACT EXTRACTION PHACO AND INTRAOCULAR LENS PLACEMENT RIGHT EYE;  Surgeon: Baruch Goldmann, MD;  Location: [facility];  Service: Ophthalmology;  Laterality: Right;  right  . ESOPHAGOGASTRODUODENOSCOPY (EGD) WITH PROPOFOL N/A 08/20/2019   Procedure: ESOPHAGOGASTRODUODENOSCOPY (EGD) WITH PROPOFOL;  Surgeon: Doran Stabler, MD;  Location: [facility];  Service: Gastroenterology;  Laterality: N/A;  . GIVENS CAPSULE STUDY N/A 08/18/2019   Procedure: GIVENS CAPSULE STUDY;  Surgeon: Doran Stabler, MD;  Location: [facility];  Service: Gastroenterology;  Laterality: N/A;  . HOT HEMOSTASIS N/A 08/20/2019   Procedure: HOT HEMOSTASIS (ARGON PLASMA COAGULATION/BICAP);  Surgeon: Doran Stabler, MD;  Location: [facility];  Service: Gastroenterology;  Laterality: N/A;  . KNEE ARTHROSCOPY Left    knee  . MASTECTOMY MODIFIED RADICAL Left 11/12/2013   Procedure: MASTECTOMY MODIFIED RADICAL;  Surgeon: Rolm Bookbinder, MD;  Location: [facility];  Service: General;  Laterality: Left;  . 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 PERCUTANEOUS CORONARY  STENT INTERVENTION (PCI-S)  2005  . PORTACATH PLACEMENT Right 12/16/2013   Procedure: INSERTION PORT-A-CATH;  Surgeon: Rolm Bookbinder, MD;  Location: [facility];  Service: General;  Laterality: Right;  . TEE WITHOUT CARDIOVERSION N/A 09/26/2017   Procedure: TRANSESOPHAGEAL ECHOCARDIOGRAM (TEE);  Surgeon: Dorothy Spark, MD;  Location[facility];  Service: Cardiovascular;  Laterality: N/A;    SOCIAL HISTORY: Social History   Socioeconomic History  . Marital status: Divorced    Spouse name: Not on file  . Number of children: 3  . Years of education: Not on file  . Highest education level: Not on file  Occupational History    Employer: HOSPICE   Tobacco Use  . Smoking status: Former Smoker    Packs/day: 0.30    Types: Cigarettes    Quit date: 11/09/1983    Years since quitting: 36.1  . Smokeless tobacco: Never Used  Substance and Sexual Activity  . Alcohol use: Not Currently  . Drug use: No  . Sexual activity: Not Currently  Other Topics Concern  . Not on file  Social History Narrative  . Not on file  Social Determinants of Health   Financial Resource Strain:   . Difficulty of Paying Living Expenses: Not on file  Food Insecurity:   . Worried About Charity fundraiser in the Last Year: Not on file  . Ran Out of Food in the Last Year: Not on file  Transportation Needs:   . Lack of Transportation (Medical): Not on file  . Lack of Transportation (Non-Medical): Not on file  Physical Activity:   . Days of Exercise per Week: Not on file  . Minutes of Exercise per Session: Not on file  Stress:   . Feeling of Stress : Not on file  Social Connections:   . Frequency of Communication with Friends and Family: Not on file  . Frequency of Social Gatherings with Friends and Family: Not on file  . Attends Religious Services: Not on file  . Active Member of Clubs or Organizations: Not on file  . Attends Archivist Meetings: Not on file  . Marital Status: Not on file   Intimate Partner Violence:   . Fear of Current or Ex-Partner: Not on file  . Emotionally Abused: Not on file  . Physically Abused: Not on file  . Sexually Abused: Not on file    FAMILY HISTORY: Family History  Problem Relation Age of Onset  . Emphysema Mother   . Heart attack Father     Review of Systems  Constitutional: Positive for fatigue and unexpected weight change. Negative for appetite change, chills and fever.  HENT:   Negative for hearing loss, lump/mass, sore throat and trouble swallowing.   Eyes: Negative for eye problems and icterus.  Respiratory: Negative for chest tightness, cough and shortness of breath.   Cardiovascular: Negative for chest pain, leg swelling and palpitations.  Gastrointestinal: Negative for abdominal distention, abdominal pain, constipation, nausea and vomiting.  Endocrine: Negative for hot flashes.  Genitourinary: Negative for difficulty urinating.   Musculoskeletal: Negative for arthralgias.  Skin: Negative for itching and rash.  Neurological: Negative for dizziness, extremity weakness, headaches and numbness.  Hematological: Negative for adenopathy. Does not bruise/bleed easily.  Psychiatric/Behavioral: Negative for depression. The patient is not nervous/anxious.       PHYSICAL EXAMINATION  ECOG PERFORMANCE STATUS: 1 - Symptomatic but completely ambulatory  Vitals:   01/10/20 0819  BP: (!) 145/61  Pulse: 65  Resp: 16  Temp: 98.2 F (36.8 C)  SpO2: 100%    Physical Exam Constitutional:      General: She is not in acute distress.    Appearance: Normal appearance. She is not toxic-appearing.  HENT:     Head: Normocephalic and atraumatic.     Mouth/Throat:     Comments: Mask in place  Eyes:     General: No scleral icterus. Cardiovascular:     Rate and Rhythm: Normal rate.     Pulses: Normal pulses.     Heart sounds: Normal heart sounds.  Pulmonary:     Effort: Pulmonary effort is normal.     Breath sounds: Normal breath  sounds.  Abdominal:     General: Abdomen is flat. Bowel sounds are normal.     Palpations: Abdomen is soft.  Musculoskeletal:        General: No swelling.     Cervical back: Neck supple.  Skin:    General: Skin is warm and dry.     Capillary Refill: Capillary refill takes less than 2 seconds.  Neurological:     General: No focal deficit present.  Mental Status: She is alert.  Psychiatric:        Mood and Affect: Mood normal.        Behavior: Behavior normal.     LABORATORY DATA:  CBC    Component Value Date/Time   WBC 5.8 01/10/2020 0803   WBC 7.2 08/30/2019 1532   RBC 4.47 01/10/2020 0803   HGB 12.6 01/10/2020 0803   HGB 13.9 05/15/2015 1307   HCT 40.1 01/10/2020 0803   HCT 42.0 05/15/2015 1307   PLT 243 01/10/2020 0803   PLT 164 05/15/2015 1307   MCV 89.7 01/10/2020 0803   MCV 90.9 05/15/2015 1307   MCH 28.2 01/10/2020 0803   MCHC 31.4 01/10/2020 0803   RDW 18.1 (H) 01/10/2020 0803   RDW 14.4 05/15/2015 1307   LYMPHSABS 1.7 01/10/2020 0803   LYMPHSABS 0.8 (L) 05/15/2015 1307   MONOABS 0.7 01/10/2020 0803   MONOABS 0.6 05/15/2015 1307   EOSABS 0.3 01/10/2020 0803   EOSABS 0.2 05/15/2015 1307   BASOSABS 0.1 01/10/2020 0803   BASOSABS 0.0 05/15/2015 1307    CMP     Component Value Date/Time   NA 140 01/10/2020 0803   NA 142 05/15/2015 1308   K 3.4 (L) 01/10/2020 0803   K 4.3 05/15/2015 1308   CL 100 01/10/2020 0803   CO2 30 01/10/2020 0803   CO2 28 05/15/2015 1308   GLUCOSE 137 (H) 01/10/2020 0803   GLUCOSE 88 05/15/2015 1308   BUN 16 01/10/2020 0803   BUN 16.7 05/15/2015 1308   CREATININE 1.48 (H) 01/10/2020 0803   CREATININE 0.9 05/15/2015 1308   CALCIUM 9.3 01/10/2020 0803   CALCIUM 10.3 05/15/2015 1308   PROT 7.2 01/10/2020 0803   PROT 6.6 05/15/2015 1308   ALBUMIN 4.1 01/10/2020 0803   ALBUMIN 4.2 05/15/2015 1308   AST 32 01/10/2020 0803   AST 36 (H) 05/15/2015 1308   ALT 21 01/10/2020 0803   ALT 48 05/15/2015 1308   ALKPHOS 72  01/10/2020 0803   ALKPHOS 82 05/15/2015 1308   BILITOT 0.4 01/10/2020 0803   BILITOT 0.66 05/15/2015 1308   GFRNONAA 34 (L) 01/10/2020 0803   GFRAA 39 (L) 01/10/2020 0803        ASSESSMENT and THERAPY PLAN:   Malignant neoplasm of upper-outer quadrant of left breast in female, estrogen receptor positive ([place]) Left breast cancer T3, N2, 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 She had radiation therapy and has been on antiestrogen therapy with Arimidex 06/28/2014-January 2019  Thoracentesis: Met Breast cancer ER PR Positive and Her 2 Neg PET/CT scan. 01/04/18: New malignant Pleural effusion with small bil pulm nodules,new mild bilateral mediastinal hilar portocaval lymph node metastases, new liver metastases, new diffuse bone metastases.  Ibrance with Faslodex 12/29/2017-02/27/2019 [facility] molecular testing: PIK3CA mutation present, androgen receptor positive, negative for BRCA1, 2 and PDL 1.  Exemestane with everolimus started 03/09/2019  06/25/2019:Liver biopsy: 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, ER 100%, PR 0%, Ki-67 10%, HER-2 -1+ by IHC I discussed the [facility] molecular testing results with the patient which showed PI K3 CA mutation as well as ER positivity.  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  Smaller lesions  also slightly increased.  5 mm to 10 mm, left hepatic lobe 18 mm to 23 mm.  Enlarged periportal lymph node 12 mm to 16 mm.  Bone lesions have not progressed.  Current Treatment: Alpelisib with Faslodex; Xgeva every 12 weeks  Tolerating moderately well. Will continue current treatment plan.  No  clinical signs of progression.  1. Increased bowel movements.  I gave her a handout on diarrhea, and dietary recommendations and recommended that she increase her fluid intake.  She knows to call if she feels weaker.   2. Weight loss.  She is down 5 pounds since her treatment change.  I recommended she increase her food intake, including protein and carbohydrates.   3.  Cancer related pain: currently in her back and liver intermittently. Relieved with tylenol she takes on occasion.  Will continue.    She will return in 4 weeks for labs, f/u, and injection.     All questions were answered. The patient knows to call the clinic with any problems, questions or concerns. We can certainly see the patient much sooner if necessary.  Total encounter time: 30 minutes*  This note was electronically signed.  Wilber Bihari, NP 01/10/20 9:14 AM Medical Oncology and Hematology [facility] [place], [place] [address] Tel. [phone]    Fax. [phone]  *Total Encounter Time as defined by the Centers for Medicare and Medicaid Services includes, in addition to the face-to-face time of a patient visit (documented in the note above) non-face-to-face time: obtaining and reviewing outside history, ordering and reviewing medications, tests or procedures, care coordination (communications with other health care professionals or caregivers) and documentation in the medical record.

## 2020-01-10 NOTE — Patient Instructions (Signed)
Fulvestrant injection What is this medicine? FULVESTRANT (ful VES trant) blocks the effects of estrogen. It is used to treat breast cancer. This medicine may be used for other purposes; ask your health care provider or pharmacist if you have questions. COMMON BRAND NAME(S): FASLODEX What should I tell my health care provider before I take this medicine? They need to know if you have any of these conditions:  bleeding disorders  liver disease  low blood counts, like low white cell, platelet, or red cell counts  an unusual or allergic reaction to fulvestrant, other medicines, foods, dyes, or preservatives  pregnant or trying to get pregnant  breast-feeding How should I use this medicine? This medicine is for injection into a muscle. It is usually given by a health care professional in a hospital or clinic setting. Talk to your pediatrician regarding the use of this medicine in children. Special care may be needed. Overdosage: If you think you have taken too much of this medicine contact a poison control center or emergency room at once. NOTE: This medicine is only for you. Do not share this medicine with others. What if I miss a dose? It is important not to miss your dose. Call your doctor or health care professional if you are unable to keep an appointment. What may interact with this medicine?  medicines that treat or prevent blood clots like warfarin, enoxaparin, dalteparin, apixaban, dabigatran, and rivaroxaban This list may not describe all possible interactions. Give your health care provider a list of all the medicines, herbs, non-prescription drugs, or dietary supplements you use. Also tell them if you smoke, drink alcohol, or use illegal drugs. Some items may interact with your medicine. What should I watch for while using this medicine? Your condition will be monitored carefully while you are receiving this medicine. You will need important blood work done while you are taking  this medicine. Do not become pregnant while taking this medicine or for at least 1 year after stopping it. Women of child-bearing potential will need to have a negative pregnancy test before starting this medicine. Women should inform their doctor if they wish to become pregnant or think they might be pregnant. There is a potential for serious side effects to an unborn child. Men should inform their doctors if they wish to father a child. This medicine may lower sperm counts. Talk to your health care professional or pharmacist for more information. Do not breast-feed an infant while taking this medicine or for 1 year after the last dose. What side effects may I notice from receiving this medicine? Side effects that you should report to your doctor or health care professional as soon as possible:  allergic reactions like skin rash, itching or hives, swelling of the face, lips, or tongue  feeling faint or lightheaded, falls  pain, tingling, numbness, or weakness in the legs  signs and symptoms of infection like fever or chills; cough; flu-like symptoms; sore throat  vaginal bleeding Side effects that usually do not require medical attention (report to your doctor or health care professional if they continue or are bothersome):  aches, pains  constipation  diarrhea  headache  hot flashes  nausea, vomiting  pain at site where injected  stomach pain This list may not describe all possible side effects. Call your doctor for medical advice about side effects. You may report side effects to FDA at 1-800-FDA-1088. Where should I keep my medicine? This drug is given in a hospital or clinic and will   not be stored at home. NOTE: This sheet is a summary. It may not cover all possible information. If you have questions about this medicine, talk to your doctor, pharmacist, or health care provider.  2020 Elsevier/Gold Standard (2018-02-19 11:34:41)  

## 2020-01-10 NOTE — Assessment & Plan Note (Addendum)
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 She had radiation therapy and has been on antiestrogen therapy with Arimidex 06/28/2014-January 2019  Thoracentesis: Met Breast cancer ER PR Positive and Her 2 Neg PET/CT scan. 01/04/18: New malignant Pleural effusion with small bil pulm nodules,new mild bilateral mediastinal hilar portocaval lymph node metastases, new liver metastases, new diffuse bone metastases.  Ibrance with Faslodex 12/29/2017-02/27/2019 [facility] molecular testing: PIK3CA mutation present, androgen receptor positive, negative for BRCA1, 2 and PDL 1.  Exemestane with everolimus started 03/09/2019  06/25/2019:Liver biopsy: 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, ER 100%, PR 0%, Ki-67 10%, HER-2 -1+ by IHC I discussed the [facility] molecular testing results with the patient which showed PI K3 CA mutation as well as ER positivity.  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  Smaller lesions also slightly increased.  5 mm to 10 mm, left hepatic lobe 18 mm to 23 mm.  Enlarged periportal lymph node 12 mm to 16 mm.  Bone lesions have not progressed.  Current Treatment: Alpelisib with Faslodex; Xgeva every 12 weeks  Tolerating moderately well. Will continue current treatment plan.  No clinical signs of progression.  1. Increased bowel movements.  I gave her a handout on diarrhea, and dietary recommendations and recommended that she increase her fluid intake.  She knows to call if she feels weaker.   2. Weight loss.  She is down 5 pounds since her treatment change.  I recommended she increase her food  intake, including protein and carbohydrates.   3.  Cancer related pain: currently in her back and liver intermittently. Relieved with tylenol she takes on occasion.  Will continue.    She will return in 4 weeks for labs, f/u, and injection.

## 2020-01-11 MED FILL — POTASSIUM CHLORIDE CRYS ER: 20 | 30 days supply | Qty: 30 | Fill #1

## 2020-01-20 DIAGNOSIS — Z23 Encounter for immunization: Secondary | ICD-10-CM | POA: Diagnosis not present

## 2020-02-04 ENCOUNTER — Other Ambulatory Visit: Payer: Self-pay | Admitting: *Deleted

## 2020-02-04 DIAGNOSIS — C50412 Malignant neoplasm of upper-outer quadrant of left female breast: Secondary | ICD-10-CM

## 2020-02-06 NOTE — Progress Notes (Signed)
Patient Care Team: Delorse Limber as PCP - General (Family Medicine)  DIAGNOSIS:    ICD-10-CM   1. Metastatic breast cancer ([place])  C50.919   2. Malignant neoplasm of upper-outer quadrant of left breast in female, estrogen receptor positive ([place])  C50.412    Z17.0     SUMMARY OF ONCOLOGIC HISTORY: Oncology History  Malignant neoplasm of upper-outer quadrant of left breast in female, estrogen receptor positive ([place])  07/28/2013 Mammogram   Spiculated mass at 6:00 position 3 cm, ultrasound revealed 2.6 x 2.1 x 1.5 cm second mass at 12:00 2.1 x 1.8 x 2.1 cm: 2 abnormal lymph nodes left axilla   10/29/2013 Initial Diagnosis   Breast cancer of upper-outer quadrant of left female breast: 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 [name], T3N2 (5/10 LN positive) stage IIIa IDC grade 1, ER/PR 100%, TSH 14% HER-2 negative PET/CT 12/20/2013 negative for metastases   12/17/2013 - 04/02/2014 Chemotherapy   Taxotere Cytoxan every 3 weeks for 6 cycles   05/11/2014 - 06/27/2014 Radiation Therapy   Adjuvant radiation therapy to chest wall and lymph nodes   07/06/2014 -  Anti-estrogen oral therapy   Arimidex 1 mg daily   12/21/2017 Imaging   Metastatic lesions noted in the lungs bilaterally, bilateral hilar and mediastinal lymph nodes, left pleural effusion, multiple thoracic vertebral metastases, heterogeneous lesions in the liver   12/22/2017 Procedure   Pleural Effusion Thoracentesis: Metastatic Breast cancer ER PR Positive Her 2 Neg   12/29/2017 Treatment Plan Change   Ibrance, Faslodex and Xgeva   03/09/2019 Treatment Plan Change   Everolimus and Exemestane   06/25/2019 Pathology Results   Liver biopsy: 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, ER 100%, PR 0%, Ki-67 10%, HER-2 -1+ by IHC     CHIEF COMPLIANT: Follow-up of metastatic breast cancer  INTERVAL HISTORY: EVERLEAN BUCHER  is a 79 y.o. with above-mentioned history of metastatic breast cancer currently on treatment withalpelisib, Faslodexand Delton See.She presents to the clinic today for a toxicity check.    ALLERGIES:  has No Known Allergies.  MEDICATIONS:  Current Outpatient Medications  Medication Sig Dispense Refill  . acetaminophen (TYLENOL) 500 MG tablet Take 1,000 mg by mouth every 6 (six) hours as needed for mild pain.    [name] alpelisib (PIQRAY 300MG DAILY DOSE) 2 x 150 MG Therapy Pack Take two 130m tablets with food at the same time daily. Swallow whole, do not crush, chew, or split. 60 each 3  . atorvastatin (LIPITOR) 20 MG tablet Take 1 tablet (20 mg total) by mouth at bedtime. 90 tablet 3  . Calcium Carb-Cholecalciferol (CALCIUM 600 + D PO) Take 1 tablet by mouth daily.    .Marland Kitchendexamethasone (DECADRON) 1 MG tablet TAKE 1 TABLET BY MOUTH EVERY DAY 30 tablet 3  . ferrous sulfate (SLOW IRON) 160 (50 Fe) MG TBCR SR tablet Take 1 tablet (160 mg total) by mouth daily. 30 tablet 0  . hydrochlorothiazide (MICROZIDE) 12.5 MG capsule TAKE 1 CAPSULE BY MOUTH EVERY DAY 90 capsule 1  . Multiple Vitamin (MULTIVITAMIN WITH MINERALS) TABS tablet Take 1 tablet by mouth daily.    . nitroGLYCERIN (NITROSTAT) 0.4 MG SL tablet PLACE 1 TABLET (0.4 MG TOTAL) UNDER THE TONGUE EVERY 5 (FIVE) MINUTES AS NEEDED FOR CHEST PAIN. 25 tablet 0  . omeprazole (PRILOSEC) 40 MG capsule Take 1 capsule (40 mg total) by mouth 2 (two) times  daily. 60 capsule 0  . ondansetron (ZOFRAN ODT) 8 MG disintegrating tablet Take 1 tablet (8 mg total) by mouth every 8 (eight) hours as needed for nausea or vomiting. 30 tablet 3  . potassium chloride SA (KLOR-CON) 20 MEQ tablet TAKE 1 TABLET (20 MEQ TOTAL) BY MOUTH DAILY. 30 tablet 3   No current facility-administered medications for this visit.    PHYSICAL EXAMINATION: ECOG PERFORMANCE STATUS: 1 - Symptomatic but completely ambulatory  There were no vitals filed for this visit. There were no vitals filed  for this visit.  LABORATORY DATA:  I have reviewed the data as listed CMP Latest Ref Rng & Units 01/10/2020 12/27/2019 12/09/2019  Glucose 70 - 99 mg/dL 137(H) 169(H) 84  BUN 8 - 23 mg/dL _0 Creatinine 0.44 - 1.00 mg/dL 1.48(H) 1.35(H) 1.09(H)  Sodium 135 - 145 mmol/L 140 139 143  Potassium 3.5 - 5.1 mmol/L 3.4(L) 3.7 3.5  Chloride 98 - 111 mmol/L 100 99 103  CO2 22 - 32 mmol/L _1 Calcium 8.9 - 10.3 mg/dL 9.3 9.4 8.9  Total Protein 6.5 - 8.1 g/dL 7.2 7.5 7.2  Total Bilirubin 0.3 - 1.2 mg/dL 0.4 0.3 0.3  Alkaline Phos 38 - 126 U/L 72 96 85  AST 15 - 41 U/L 32 38 46(H)  ALT 0 - 44 U/L _2 Lab Results  Component Value Date   WBC 5.8 01/10/2020   HGB 12.6 01/10/2020   HCT 40.1 01/10/2020   MCV 89.7 01/10/2020   PLT 243 01/10/2020   NEUTROABS 2.9 01/10/2020    ASSESSMENT & PLAN:  Malignant neoplasm of upper-outer quadrant of left breast in female, estrogen receptor positive ([place]) Left breast cancer T3, N2, 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 She had radiation therapy and has been on antiestrogen therapy with Arimidex 06/28/2014-January 2019  Thoracentesis: Met Breast cancer ER PR Positive and Her 2 Neg  Ibrance with Faslodex 12/29/2017-02/27/2019 [facility] molecular testing: PIK3CA mutation present, androgen receptor positive, negative for BRCA1, 2 and PDL 1.  Exemestane with everolimus started 03/09/2019  06/25/2019:Liver biopsy: 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, ER 100%, PR 0%, Ki-67 10%, HER-2 -1+ by IHC I discussed the [facility] molecular testing results with the patient which showed PI K3 CA mutation as well as ER positivity.  Hospitalization 08/15/2019-08/22/2019: Severe upper GI bleed with hemoglobin 4.2, received 5 units of PRBC, capsule endoscopy revealed AVMs in small  bowel(Dr.Madoff)  CT CAP 12/10/2019: Right liver lobe lesion slight increase in size from 5.8 cm to 6.7 cm. Smaller lesions also slightly increased. 5 mm to 10 mm, left hepatic lobe 18 mm to 23 mm. Enlarged periportal lymph node 12 mm to 16 mm. Bone lesions have not progressed. ------------------------------------------------------------------------------------------------------------------------------------------ Current Treatment: Alpelisib with Faslodex; Xgeva every 12 weeks  Toxicities: 1.  Mild diarrhea 2.  Weight loss 3.  Cancer related pain in the back and liver intermittently. Monitoring her blood sugars to make sure Piqray does not raise her sugars.  We will obtain CT chest abdomen pelvis in 1 month Return to clinic every 4 weeks for labs and injection.    No orders of the defined types were placed in this encounter.  The patient has a good understanding of the overall plan. she agrees with it. she will call with any problems that may develop  before the next visit here.  Total time spent: 30 mins including face to face time and time spent for planning, charting and coordination of care  [name] Lose, MD 02/07/2020  I, Cloyde Reams Dorshimer, am acting as scribe for Dr. [name] Lose.  I have reviewed the above documentation for accuracy and completeness, and I agree with the above.

## 2020-02-07 ENCOUNTER — Inpatient Hospital Stay: Payer: Medicare Other | Attending: Hematology and Oncology

## 2020-02-07 ENCOUNTER — Inpatient Hospital Stay (HOSPITAL_BASED_OUTPATIENT_CLINIC_OR_DEPARTMENT_OTHER): Payer: Medicare Other | Admitting: Hematology and Oncology

## 2020-02-07 ENCOUNTER — Other Ambulatory Visit: Payer: Self-pay

## 2020-02-07 ENCOUNTER — Inpatient Hospital Stay: Payer: Medicare Other

## 2020-02-07 VITALS — BP 161/78 | HR 66 | Temp 98.2°F | Resp 17 | Ht 68.0 in | Wt 142.4 lb

## 2020-02-07 DIAGNOSIS — Z5111 Encounter for antineoplastic chemotherapy: Secondary | ICD-10-CM | POA: Diagnosis not present

## 2020-02-07 DIAGNOSIS — C7802 Secondary malignant neoplasm of left lung: Secondary | ICD-10-CM | POA: Diagnosis not present

## 2020-02-07 DIAGNOSIS — C787 Secondary malignant neoplasm of liver and intrahepatic bile duct: Secondary | ICD-10-CM | POA: Insufficient documentation

## 2020-02-07 DIAGNOSIS — C7800 Secondary malignant neoplasm of unspecified lung: Secondary | ICD-10-CM

## 2020-02-07 DIAGNOSIS — Z923 Personal history of irradiation: Secondary | ICD-10-CM | POA: Diagnosis not present

## 2020-02-07 DIAGNOSIS — C7951 Secondary malignant neoplasm of bone: Secondary | ICD-10-CM | POA: Insufficient documentation

## 2020-02-07 DIAGNOSIS — Z170 Estrogen receptor positive status [ER+]: Secondary | ICD-10-CM | POA: Diagnosis not present

## 2020-02-07 DIAGNOSIS — C7801 Secondary malignant neoplasm of right lung: Secondary | ICD-10-CM | POA: Insufficient documentation

## 2020-02-07 DIAGNOSIS — R634 Abnormal weight loss: Secondary | ICD-10-CM | POA: Diagnosis not present

## 2020-02-07 DIAGNOSIS — C50412 Malignant neoplasm of upper-outer quadrant of left female breast: Secondary | ICD-10-CM

## 2020-02-07 DIAGNOSIS — I10 Essential (primary) hypertension: Secondary | ICD-10-CM | POA: Insufficient documentation

## 2020-02-07 DIAGNOSIS — Z79811 Long term (current) use of aromatase inhibitors: Secondary | ICD-10-CM | POA: Insufficient documentation

## 2020-02-07 DIAGNOSIS — G893 Neoplasm related pain (acute) (chronic): Secondary | ICD-10-CM | POA: Diagnosis not present

## 2020-02-07 DIAGNOSIS — Z79899 Other long term (current) drug therapy: Secondary | ICD-10-CM | POA: Diagnosis not present

## 2020-02-07 DIAGNOSIS — R197 Diarrhea, unspecified: Secondary | ICD-10-CM | POA: Diagnosis not present

## 2020-02-07 DIAGNOSIS — K219 Gastro-esophageal reflux disease without esophagitis: Secondary | ICD-10-CM | POA: Insufficient documentation

## 2020-02-07 DIAGNOSIS — M549 Dorsalgia, unspecified: Secondary | ICD-10-CM | POA: Insufficient documentation

## 2020-02-07 DIAGNOSIS — Z7952 Long term (current) use of systemic steroids: Secondary | ICD-10-CM | POA: Diagnosis not present

## 2020-02-07 DIAGNOSIS — C50919 Malignant neoplasm of unspecified site of unspecified female breast: Secondary | ICD-10-CM

## 2020-02-07 DIAGNOSIS — E785 Hyperlipidemia, unspecified: Secondary | ICD-10-CM | POA: Insufficient documentation

## 2020-02-07 LAB — CBC WITH DIFFERENTIAL (CANCER CENTER ONLY)
Abs Immature Granulocytes: 0.03 10*3/uL (ref 0.00–0.07)
Basophils Absolute: 0.1 10*3/uL (ref 0.0–0.1)
Basophils Relative: 1 %
Eosinophils Absolute: 0.2 10*3/uL (ref 0.0–0.5)
Eosinophils Relative: 2 %
HCT: 42.8 % (ref 36.0–46.0)
Hemoglobin: 13.6 g/dL (ref 12.0–15.0)
Immature Granulocytes: 0 %
Lymphocytes Relative: 20 %
Lymphs Abs: 1.3 10*3/uL (ref 0.7–4.0)
MCH: 28.3 pg (ref 26.0–34.0)
MCHC: 31.8 g/dL (ref 30.0–36.0)
MCV: 89.2 fL (ref 80.0–100.0)
Monocytes Absolute: 0.7 10*3/uL (ref 0.1–1.0)
Monocytes Relative: 10 %
Neutro Abs: 4.5 10*3/uL (ref 1.7–7.7)
Neutrophils Relative %: 67 %
Platelet Count: 287 10*3/uL (ref 150–400)
RBC: 4.8 MIL/uL (ref 3.87–5.11)
RDW: 18.2 % — ABNORMAL HIGH (ref 11.5–15.5)
WBC Count: 6.7 10*3/uL (ref 4.0–10.5)
nRBC: 0 % (ref 0.0–0.2)

## 2020-02-07 LAB — CMP (CANCER CENTER ONLY)
ALT: 16 U/L (ref 0–44)
AST: 31 U/L (ref 15–41)
Albumin: 4.2 g/dL (ref 3.5–5.0)
Alkaline Phosphatase: 72 U/L (ref 38–126)
Anion gap: 11 (ref 5–15)
BUN: 21 mg/dL (ref 8–23)
CO2: 31 mmol/L (ref 22–32)
Calcium: 9.5 mg/dL (ref 8.9–10.3)
Chloride: 97 mmol/L — ABNORMAL LOW (ref 98–111)
Creatinine: 1.43 mg/dL — ABNORMAL HIGH (ref 0.44–1.00)
GFR, Est AFR Am: 41 mL/min — ABNORMAL LOW (ref >60)
GFR, Estimated: 35 mL/min — ABNORMAL LOW (ref >60)
Glucose, Bld: 99 mg/dL (ref 70–99)
Potassium: 3.5 mmol/L (ref 3.5–5.1)
Sodium: 139 mmol/L (ref 135–145)
Total Bilirubin: 0.4 mg/dL (ref 0.3–1.2)
Total Protein: 7.2 g/dL (ref 6.5–8.1)

## 2020-02-07 MED ORDER — FULVESTRANT 250 MG/5ML IM SOLN
INTRAMUSCULAR | Status: AC
  Filled 2020-02-07: qty 10

## 2020-02-07 MED ORDER — FULVESTRANT 250 MG/5ML IM SOLN
500.0000 mg | Freq: Once | INTRAMUSCULAR | Status: AC
  Administered 2020-02-07: 500 mg via INTRAMUSCULAR

## 2020-02-07 MED FILL — POTASSIUM CHLORIDE CRYS ER: 20 | 30 days supply | Qty: 30 | Fill #2

## 2020-02-07 NOTE — Patient Instructions (Signed)
Fulvestrant injection What is this medicine? FULVESTRANT (ful VES trant) blocks the effects of estrogen. It is used to treat breast cancer. This medicine may be used for other purposes; ask your health care provider or pharmacist if you have questions. COMMON BRAND NAME(S): FASLODEX What should I tell my health care provider before I take this medicine? They need to know if you have any of these conditions:  bleeding disorders  liver disease  low blood counts, like low white cell, platelet, or red cell counts  an unusual or allergic reaction to fulvestrant, other medicines, foods, dyes, or preservatives  pregnant or trying to get pregnant  breast-feeding How should I use this medicine? This medicine is for injection into a muscle. It is usually given by a health care professional in a hospital or clinic setting. Talk to your pediatrician regarding the use of this medicine in children. Special care may be needed. Overdosage: If you think you have taken too much of this medicine contact a poison control center or emergency room at once. NOTE: This medicine is only for you. Do not share this medicine with others. What if I miss a dose? It is important not to miss your dose. Call your doctor or health care professional if you are unable to keep an appointment. What may interact with this medicine?  medicines that treat or prevent blood clots like warfarin, enoxaparin, dalteparin, apixaban, dabigatran, and rivaroxaban This list may not describe all possible interactions. Give your health care provider a list of all the medicines, herbs, non-prescription drugs, or dietary supplements you use. Also tell them if you smoke, drink alcohol, or use illegal drugs. Some items may interact with your medicine. What should I watch for while using this medicine? Your condition will be monitored carefully while you are receiving this medicine. You will need important blood work done while you are taking  this medicine. Do not become pregnant while taking this medicine or for at least 1 year after stopping it. Women of child-bearing potential will need to have a negative pregnancy test before starting this medicine. Women should inform their doctor if they wish to become pregnant or think they might be pregnant. There is a potential for serious side effects to an unborn child. Men should inform their doctors if they wish to father a child. This medicine may lower sperm counts. Talk to your health care professional or pharmacist for more information. Do not breast-feed an infant while taking this medicine or for 1 year after the last dose. What side effects may I notice from receiving this medicine? Side effects that you should report to your doctor or health care professional as soon as possible:  allergic reactions like skin rash, itching or hives, swelling of the face, lips, or tongue  feeling faint or lightheaded, falls  pain, tingling, numbness, or weakness in the legs  signs and symptoms of infection like fever or chills; cough; flu-like symptoms; sore throat  vaginal bleeding Side effects that usually do not require medical attention (report to your doctor or health care professional if they continue or are bothersome):  aches, pains  constipation  diarrhea  headache  hot flashes  nausea, vomiting  pain at site where injected  stomach pain This list may not describe all possible side effects. Call your doctor for medical advice about side effects. You may report side effects to FDA at 1-800-FDA-1088. Where should I keep my medicine? This drug is given in a hospital or clinic and will   not be stored at home. NOTE: This sheet is a summary. It may not cover all possible information. If you have questions about this medicine, talk to your doctor, pharmacist, or health care provider.  2020 Elsevier/Gold Standard (2018-02-19 11:34:41)  

## 2020-02-07 NOTE — Assessment & Plan Note (Signed)
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 She had radiation therapy and has been on antiestrogen therapy with Arimidex 06/28/2014-January 2019  Thoracentesis: Met Breast cancer ER PR Positive and Her 2 Neg  Ibrance with Faslodex 12/29/2017-02/27/2019 [facility] molecular testing: PIK3CA mutation present, androgen receptor positive, negative for BRCA1, 2 and PDL 1.  Exemestane with everolimus started 03/09/2019  06/25/2019:Liver biopsy: 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, ER 100%, PR 0%, Ki-67 10%, HER-2 -1+ by IHC I discussed the [facility] molecular testing results with the patient which showed PI K3 CA mutation as well as ER positivity.  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 Smaller lesions also slightly increased. 5 mm to 10 mm, left hepatic lobe 18 mm to 23 mm. Enlarged periportal lymph node 12 mm to 16 mm. Bone lesions have not progressed. ------------------------------------------------------------------------------------------------------------------------------------------ Current Treatment: Alpelisib with Faslodex; Xgeva every 12 weeks  Toxicities: 1.  Mild diarrhea 2.  Weight loss 3.  Cancer related pain in the back and liver intermittently.  Neck scans to be performed in 3 months. Return to clinic every 4 weeks for labs and injection.   

## 2020-02-11 ENCOUNTER — Telehealth: Payer: Self-pay | Admitting: Hematology and Oncology

## 2020-02-11 NOTE — Telephone Encounter (Signed)
Scheduled per 03/15 los, patient has been called and notified.

## 2020-02-18 DIAGNOSIS — Z23 Encounter for immunization: Secondary | ICD-10-CM | POA: Diagnosis not present

## 2020-02-21 ENCOUNTER — Other Ambulatory Visit: Payer: Self-pay | Admitting: Cardiovascular Disease

## 2020-03-03 ENCOUNTER — Other Ambulatory Visit: Payer: Self-pay

## 2020-03-03 ENCOUNTER — Ambulatory Visit (HOSPITAL_COMMUNITY)
Admission: RE | Admit: 2020-03-03 | Discharge: 2020-03-03 | Disposition: A | Discharge status: Home / Self Care | Payer: Medicare Other | Source: Ambulatory Visit | Attending: Hematology and Oncology | Admitting: Hematology and Oncology

## 2020-03-03 DIAGNOSIS — C7800 Secondary malignant neoplasm of unspecified lung: Secondary | ICD-10-CM

## 2020-03-03 DIAGNOSIS — C7951 Secondary malignant neoplasm of bone: Secondary | ICD-10-CM | POA: Diagnosis not present

## 2020-03-03 DIAGNOSIS — Z170 Estrogen receptor positive status [ER+]: Secondary | ICD-10-CM | POA: Insufficient documentation

## 2020-03-03 DIAGNOSIS — C787 Secondary malignant neoplasm of liver and intrahepatic bile duct: Secondary | ICD-10-CM | POA: Diagnosis not present

## 2020-03-03 DIAGNOSIS — C50412 Malignant neoplasm of upper-outer quadrant of left female breast: Secondary | ICD-10-CM | POA: Insufficient documentation

## 2020-03-03 DIAGNOSIS — C50919 Malignant neoplasm of unspecified site of unspecified female breast: Secondary | ICD-10-CM | POA: Diagnosis not present

## 2020-03-03 MED ORDER — IOHEXOL 300 MG/ML  SOLN
100.0000 mL | Freq: Once | INTRAMUSCULAR | Status: AC | PRN
  Administered 2020-03-03: 13:00:00 75 mL via INTRAVENOUS

## 2020-03-03 MED ORDER — SODIUM CHLORIDE (PF) 0.9 % IJ SOLN
INTRAMUSCULAR | Status: AC
  Filled 2020-03-03: qty 50

## 2020-03-05 NOTE — Progress Notes (Signed)
Patient Care Team: Delorse Limber as PCP - General (Family Medicine)  DIAGNOSIS:    ICD-10-CM   1. 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 Liver metastases ([place])  C78.7   3. Malignant neoplasm metastatic to lung, unspecified laterality ([place])  C78.00   4. Metastatic breast cancer ([place])  C50.919   5. Malignant neoplasm of upper-outer quadrant of left breast in female, estrogen receptor positive ([place])  C50.412    Z17.0     SUMMARY OF ONCOLOGIC HISTORY: Oncology History  Malignant neoplasm of upper-outer quadrant of left breast in female, estrogen receptor positive ([place])  07/28/2013 Mammogram   Spiculated mass at 6:00 position 3 cm, ultrasound revealed 2.6 x 2.1 x 1.5 cm second mass at 12:00 2.1 x 1.8 x 2.1 cm: 2 abnormal lymph nodes left axilla   10/29/2013 Initial Diagnosis   Breast cancer of upper-outer quadrant of left female breast: 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 [name], T3N2 (5/10 LN positive) stage IIIa IDC grade 1, ER/PR 100%, TSH 14% HER-2 negative PET/CT 12/20/2013 negative for metastases   12/17/2013 - 04/02/2014 Chemotherapy   Taxotere Cytoxan every 3 weeks for 6 cycles   05/11/2014 - 06/27/2014 Radiation Therapy   Adjuvant radiation therapy to chest wall and lymph nodes   07/06/2014 -  Anti-estrogen oral therapy   Arimidex 1 mg daily   12/21/2017 Imaging   Metastatic lesions noted in the lungs bilaterally, bilateral hilar and mediastinal lymph nodes, left pleural effusion, multiple thoracic vertebral metastases, heterogeneous lesions in the liver   12/22/2017 Procedure   Pleural Effusion Thoracentesis: Metastatic Breast cancer ER PR Positive Her 2 Neg   12/29/2017 Treatment Plan Change   Ibrance, Faslodex and Xgeva   03/09/2019 Treatment Plan Change   Everolimus and Exemestane   06/25/2019 Pathology Results   Liver biopsy: Metastatic adenocarcinoma to the liver consistent with breast  cancer, ER 100%, PR 0%, Ki-67 10%, HER-2 -1+ by IHC     CHIEF COMPLIANT: Follow-up of metastatic breast cancer, review scans   INTERVAL HISTORY: Wendy [name] is a 79 y.o. with above-mentioned history of metastatic breast cancer currently on treatment withalpelisib,Faslodexand Xgeva.CT CAP on 03/03/20 showed mild improvement in size of liver metastases, stable bone metastases other than slight increase in T11 metastasis, decrease in portacaval adenopathy, and a small stable left pleural effusion. She presents to the clinic todayfor a toxicity check and to review her scans.  She continues to have complaints of fatigue and she rests quite often.  She does not have a good appetite but she is eating and stabilized on her weight.  ALLERGIES:  has No Known Allergies.  MEDICATIONS:  Current Outpatient Medications  Medication Sig Dispense Refill  . acetaminophen (TYLENOL) 500 MG tablet Take 1,000 mg by mouth every 6 (six) hours as needed for mild pain.    [name] alpelisib (PIQRAY 300MG DAILY DOSE) 2 x 150 MG Therapy Pack Take two 129m tablets with food at the same time daily. Swallow whole, do not crush, chew, or split. 60 each 3  . atorvastatin (LIPITOR) 20 MG tablet Take 1 tablet (20 mg total) by mouth at bedtime. 90 tablet 3  . Calcium Carb-Cholecalciferol (CALCIUM 600 + D PO) Take 1 tablet by mouth daily.    .Marland Kitchendexamethasone (DECADRON) 1 MG tablet TAKE 1 TABLET BY MOUTH EVERY DAY 30 tablet 3  . ferrous sulfate (SLOW IRON) 160 (50 Fe) MG  TBCR SR tablet Take 1 tablet (160 mg total) by mouth daily. 30 tablet 0  . hydrochlorothiazide (MICROZIDE) 12.5 MG capsule TAKE 1 CAPSULE BY MOUTH EVERY DAY 90 capsule 1  . Multiple Vitamin (MULTIVITAMIN WITH MINERALS) TABS tablet Take 1 tablet by mouth daily.    . nitroGLYCERIN (NITROSTAT) 0.4 MG SL tablet PLACE 1 TABLET (0.4 MG TOTAL) UNDER THE TONGUE EVERY 5 (FIVE) MINUTES AS NEEDED FOR CHEST PAIN. 25 tablet 0  . omeprazole (PRILOSEC) 40 MG capsule Take 1  capsule (40 mg total) by mouth 2 (two) times daily. 60 capsule 0  . ondansetron (ZOFRAN ODT) 8 MG disintegrating tablet Take 1 tablet (8 mg total) by mouth every 8 (eight) hours as needed for nausea or vomiting. 30 tablet 3  . potassium chloride SA (KLOR-CON) 20 MEQ tablet TAKE 1 TABLET (20 MEQ TOTAL) BY MOUTH DAILY. 30 tablet 3   No current facility-administered medications for this visit.    PHYSICAL EXAMINATION: ECOG PERFORMANCE STATUS: 1 - Symptomatic but completely ambulatory  Vitals:   03/06/20 1032  BP: (!) 141/65  Pulse: (!) 59  Resp: 16  Temp: 98.5 F (36.9 C)  SpO2: 100%   Filed Weights   03/06/20 1032  Weight: 143 lb 9.6 oz (65.1 kg)    LABORATORY DATA:  I have reviewed the data as listed CMP Latest Ref Rng & Units 02/07/2020 01/10/2020 12/27/2019  Glucose 70 - 99 mg/dL 99 137(H) 169(H)  BUN 8 - 23 mg/dL 21 16 17   Creatinine 0.44 - 1.00 mg/dL 1.43(H) 1.48(H) 1.35(H)  Sodium 135 - 145 mmol/L 139 140 139  Potassium 3.5 - 5.1 mmol/L 3.5 3.4(L) 3.7  Chloride 98 - 111 mmol/L 97(L) 100 99  CO2 22 - 32 mmol/L 31 30 29   Calcium 8.9 - 10.3 mg/dL 9.5 9.3 9.4  Total Protein 6.5 - 8.1 g/dL 7.2 7.2 7.5  Total Bilirubin 0.3 - 1.2 mg/dL 0.4 0.4 0.3  Alkaline Phos 38 - 126 U/L 72 72 96  AST 15 - 41 U/L 31 32 38  ALT 0 - 44 U/L 16 21 24     Lab Results  Component Value Date   WBC 7.7 03/06/2020   HGB 12.8 03/06/2020   HCT 39.8 03/06/2020   MCV 89.2 03/06/2020   PLT 219 03/06/2020   NEUTROABS 3.9 03/06/2020    ASSESSMENT & PLAN:  Malignant neoplasm of upper-outer quadrant of left breast in female, estrogen receptor positive ([place]) Left breast cancer T3, N2, 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 She had radiation therapy and has been on antiestrogen therapy with Arimidex 06/28/2014-January 2019  Thoracentesis: Met Breast cancer ER PR Positive  and Her 2 Neg  Ibrance with Faslodex 12/29/2017-02/27/2019 [facility] molecular testing: PIK3CA mutation present, androgen receptor positive, negative for BRCA1, 2 and PDL 1.  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 Smaller lesions also slightly increased. 5 mm to 10 mm, left hepatic lobe  18 mm to 23 mm. Enlarged periportal lymph node 12 mm to 16 mm. Bone lesions have not progressed. ------------------------------------------------------------------------------------------------------------------------------------------ Current Treatment: Alpelisib with Faslodex; Xgeva every 12 weeks Toxicities: 1.  Diarrhea 2.  Weight loss 3.  Cancer related back and liver pain  Monitoring her blood sugars.  CT CAP 03/03/2020: Overall mild improvement in the multifocal liver metastases.  Slight increase in T11 vertebral body metastases.  Other lesions are stable.  Decrease in the portacaval adenopathy.  Stable left pleural effusion.  Radiology review: Based upon response to current treatment we will continue with the same treatment plan for 3 more months before the next scan Return to clinic monthly for injections labs and follow-up.   No orders of the defined types were placed in this encounter.  The patient has a good understanding of the overall plan. she agrees with it. she will call with any problems that may develop before the next visit here.  Total time spent: 30 mins including face  to face time and time spent for planning, charting and coordination of care  [name] Lose, MD 03/06/2020  I, Cloyde Reams Dorshimer, am acting as scribe for Dr. [name] Lose.  I have reviewed the above documentation for accuracy and completeness, and I agree with the above.

## 2020-03-06 ENCOUNTER — Inpatient Hospital Stay: Payer: Medicare Other | Attending: Hematology and Oncology

## 2020-03-06 ENCOUNTER — Other Ambulatory Visit: Payer: Self-pay

## 2020-03-06 ENCOUNTER — Inpatient Hospital Stay (HOSPITAL_BASED_OUTPATIENT_CLINIC_OR_DEPARTMENT_OTHER): Payer: Medicare Other | Admitting: Hematology and Oncology

## 2020-03-06 ENCOUNTER — Other Ambulatory Visit: Payer: Self-pay | Admitting: *Deleted

## 2020-03-06 ENCOUNTER — Inpatient Hospital Stay: Payer: Medicare Other

## 2020-03-06 VITALS — BP 141/65 | HR 59 | Temp 98.5°F | Resp 16 | Ht 68.0 in | Wt 143.6 lb

## 2020-03-06 DIAGNOSIS — Z923 Personal history of irradiation: Secondary | ICD-10-CM | POA: Diagnosis not present

## 2020-03-06 DIAGNOSIS — C787 Secondary malignant neoplasm of liver and intrahepatic bile duct: Secondary | ICD-10-CM | POA: Insufficient documentation

## 2020-03-06 DIAGNOSIS — R634 Abnormal weight loss: Secondary | ICD-10-CM | POA: Diagnosis not present

## 2020-03-06 DIAGNOSIS — C7800 Secondary malignant neoplasm of unspecified lung: Secondary | ICD-10-CM

## 2020-03-06 DIAGNOSIS — Z170 Estrogen receptor positive status [ER+]: Secondary | ICD-10-CM

## 2020-03-06 DIAGNOSIS — R5383 Other fatigue: Secondary | ICD-10-CM | POA: Diagnosis not present

## 2020-03-06 DIAGNOSIS — R197 Diarrhea, unspecified: Secondary | ICD-10-CM | POA: Diagnosis not present

## 2020-03-06 DIAGNOSIS — C50412 Malignant neoplasm of upper-outer quadrant of left female breast: Secondary | ICD-10-CM

## 2020-03-06 DIAGNOSIS — C50919 Malignant neoplasm of unspecified site of unspecified female breast: Secondary | ICD-10-CM | POA: Diagnosis not present

## 2020-03-06 DIAGNOSIS — R599 Enlarged lymph nodes, unspecified: Secondary | ICD-10-CM | POA: Diagnosis not present

## 2020-03-06 DIAGNOSIS — G893 Neoplasm related pain (acute) (chronic): Secondary | ICD-10-CM | POA: Diagnosis not present

## 2020-03-06 DIAGNOSIS — Z5111 Encounter for antineoplastic chemotherapy: Secondary | ICD-10-CM | POA: Insufficient documentation

## 2020-03-06 DIAGNOSIS — C7951 Secondary malignant neoplasm of bone: Secondary | ICD-10-CM | POA: Insufficient documentation

## 2020-03-06 LAB — CMP (CANCER CENTER ONLY)
ALT: 21 U/L (ref 0–44)
AST: 31 U/L (ref 15–41)
Albumin: 4 g/dL (ref 3.5–5.0)
Alkaline Phosphatase: 67 U/L (ref 38–126)
Anion gap: 7 (ref 5–15)
BUN: 19 mg/dL (ref 8–23)
CO2: 33 mmol/L — ABNORMAL HIGH (ref 22–32)
Calcium: 9.8 mg/dL (ref 8.9–10.3)
Chloride: 101 mmol/L (ref 98–111)
Creatinine: 1.44 mg/dL — ABNORMAL HIGH (ref 0.44–1.00)
GFR, Est AFR Am: 40 mL/min — ABNORMAL LOW (ref >60)
GFR, Estimated: 35 mL/min — ABNORMAL LOW (ref >60)
Glucose, Bld: 83 mg/dL (ref 70–99)
Potassium: 4.3 mmol/L (ref 3.5–5.1)
Sodium: 141 mmol/L (ref 135–145)
Total Bilirubin: 0.6 mg/dL (ref 0.3–1.2)
Total Protein: 6.7 g/dL (ref 6.5–8.1)

## 2020-03-06 LAB — CBC WITH DIFFERENTIAL (CANCER CENTER ONLY)
Abs Immature Granulocytes: 0.03 10*3/uL (ref 0.00–0.07)
Basophils Absolute: 0.1 10*3/uL (ref 0.0–0.1)
Basophils Relative: 1 %
Eosinophils Absolute: 0.2 10*3/uL (ref 0.0–0.5)
Eosinophils Relative: 2 %
HCT: 39.8 % (ref 36.0–46.0)
Hemoglobin: 12.8 g/dL (ref 12.0–15.0)
Immature Granulocytes: 0 %
Lymphocytes Relative: 35 %
Lymphs Abs: 2.7 10*3/uL (ref 0.7–4.0)
MCH: 28.7 pg (ref 26.0–34.0)
MCHC: 32.2 g/dL (ref 30.0–36.0)
MCV: 89.2 fL (ref 80.0–100.0)
Monocytes Absolute: 0.9 10*3/uL (ref 0.1–1.0)
Monocytes Relative: 11 %
Neutro Abs: 3.9 10*3/uL (ref 1.7–7.7)
Neutrophils Relative %: 51 %
Platelet Count: 219 10*3/uL (ref 150–400)
RBC: 4.46 MIL/uL (ref 3.87–5.11)
RDW: 20.2 % — ABNORMAL HIGH (ref 11.5–15.5)
WBC Count: 7.7 10*3/uL (ref 4.0–10.5)
nRBC: 0 % (ref 0.0–0.2)

## 2020-03-06 MED ORDER — FULVESTRANT 250 MG/5ML IM SOLN
INTRAMUSCULAR | Status: AC
  Filled 2020-03-06: qty 10

## 2020-03-06 MED ORDER — HYDROCHLOROTHIAZIDE 12.5 MG PO CAPS: 12.5000 mg | ORAL_CAPSULE | Freq: Every day | ORAL | 2 refills | Status: DC

## 2020-03-06 MED ORDER — FULVESTRANT 250 MG/5ML IM SOLN
500.0000 mg | Freq: Once | INTRAMUSCULAR | Status: AC
  Administered 2020-03-06: 500 mg via INTRAMUSCULAR

## 2020-03-06 MED ORDER — DENOSUMAB 120 MG/1.7ML ~~LOC~~ SOLN
SUBCUTANEOUS | Status: AC
  Filled 2020-03-06: qty 1.7

## 2020-03-06 MED ORDER — CLARITIN-D 24 HOUR 10-240 MG PO TB24: 1.0000 | ORAL_TABLET | Freq: Every day | ORAL | Status: DC

## 2020-03-06 MED ORDER — DENOSUMAB 120 MG/1.7ML ~~LOC~~ SOLN
120.0000 mg | Freq: Once | SUBCUTANEOUS | Status: AC
  Administered 2020-03-06: 120 mg via SUBCUTANEOUS

## 2020-03-06 MED FILL — POTASSIUM CHLORIDE CRYS ER: 20 | 30 days supply | Qty: 30 | Fill #3

## 2020-03-06 NOTE — Assessment & Plan Note (Signed)
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 She had radiation therapy and has been on antiestrogen therapy with Arimidex 06/28/2014-January 2019  Thoracentesis: Met Breast cancer ER PR Positive and Her 2 Neg  Ibrance with Faslodex 12/29/2017-02/27/2019 [facility] molecular testing: PIK3CA mutation present, androgen receptor positive, negative for BRCA1, 2 and PDL 1.  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 Smaller lesions also slightly increased. 5 mm to 10 mm, left hepatic lobe 18 mm to 23 mm. Enlarged periportal lymph node 12 mm to 16 mm. Bone lesions have not progressed. ------------------------------------------------------------------------------------------------------------------------------------------ Current Treatment: Alpelisib with Faslodex; Xgeva every 12 weeks Toxicities: 1.  Diarrhea 2.  Weight loss 3.  Cancer related back and liver pain  Monitoring her blood sugars.  CT CAP 03/03/2020: Overall mild improvement in the multifocal liver metastases.  Slight increase in T11 vertebral body metastases.  Other lesions are stable.  Decrease in the portacaval adenopathy.  Stable left pleural effusion.  Radiology review: Based upon response to current treatment we will continue with the same treatment plan for 3 more months  before the next scan

## 2020-03-06 NOTE — Patient Instructions (Signed)
Fulvestrant injection What is this medicine? FULVESTRANT (ful VES trant) blocks the effects of estrogen. It is used to treat breast cancer. This medicine may be used for other purposes; ask your health care provider or pharmacist if you have questions. COMMON BRAND NAME(S): FASLODEX What should I tell my health care provider before I take this medicine? They need to know if you have any of these conditions:  bleeding disorders  liver disease  low blood counts, like low white cell, platelet, or red cell counts  an unusual or allergic reaction to fulvestrant, other medicines, foods, dyes, or preservatives  pregnant or trying to get pregnant  breast-feeding How should I use this medicine? This medicine is for injection into a muscle. It is usually given by a health care professional in a hospital or clinic setting. Talk to your pediatrician regarding the use of this medicine in children. Special care may be needed. Overdosage: If you think you have taken too much of this medicine contact a poison control center or emergency room at once. NOTE: This medicine is only for you. Do not share this medicine with others. What if I miss a dose? It is important not to miss your dose. Call your doctor or health care professional if you are unable to keep an appointment. What may interact with this medicine?  medicines that treat or prevent blood clots like warfarin, enoxaparin, dalteparin, apixaban, dabigatran, and rivaroxaban This list may not describe all possible interactions. Give your health care provider a list of all the medicines, herbs, non-prescription drugs, or dietary supplements you use. Also tell them if you smoke, drink alcohol, or use illegal drugs. Some items may interact with your medicine. What should I watch for while using this medicine? Your condition will be monitored carefully while you are receiving this medicine. You will need important blood work done while you are taking  this medicine. Do not become pregnant while taking this medicine or for at least 1 year after stopping it. Women of child-bearing potential will need to have a negative pregnancy test before starting this medicine. Women should inform their doctor if they wish to become pregnant or think they might be pregnant. There is a potential for serious side effects to an unborn child. Men should inform their doctors if they wish to father a child. This medicine may lower sperm counts. Talk to your health care professional or pharmacist for more information. Do not breast-feed an infant while taking this medicine or for 1 year after the last dose. What side effects may I notice from receiving this medicine? Side effects that you should report to your doctor or health care professional as soon as possible:  allergic reactions like skin rash, itching or hives, swelling of the face, lips, or tongue  feeling faint or lightheaded, falls  pain, tingling, numbness, or weakness in the legs  signs and symptoms of infection like fever or chills; cough; flu-like symptoms; sore throat  vaginal bleeding Side effects that usually do not require medical attention (report to your doctor or health care professional if they continue or are bothersome):  aches, pains  constipation  diarrhea  headache  hot flashes  nausea, vomiting  pain at site where injected  stomach pain This list may not describe all possible side effects. Call your doctor for medical advice about side effects. You may report side effects to FDA at 1-800-FDA-1088. Where should I keep my medicine? This drug is given in a hospital or clinic and will  not be stored at home. NOTE: This sheet is a summary. It may not cover all possible information. If you have questions about this medicine, talk to your doctor, pharmacist, or health care provider.  2020 [employer]/Gold Standard (2018-02-19 11:34:41) Denosumab injection What is this  medicine? DENOSUMAB (den oh sue mab) slows bone breakdown. Prolia is used to treat osteoporosis in women after menopause and in men, and in people who are taking corticosteroids for 6 months or more. Delton See is used to treat a high calcium level due to cancer and to prevent bone fractures and other bone problems caused by multiple myeloma or cancer bone metastases. Delton See is also used to treat giant cell tumor of the bone. This medicine may be used for other purposes; ask your health care provider or pharmacist if you have questions. COMMON BRAND NAME(S): Prolia, XGEVA What should I tell my health care provider before I take this medicine? They need to know if you have any of these conditions:  dental disease  having surgery or tooth extraction  infection  kidney disease  low levels of calcium or Vitamin D in the blood  malnutrition  on hemodialysis  skin conditions or sensitivity  thyroid or parathyroid disease  an unusual reaction to denosumab, other medicines, foods, dyes, or preservatives  pregnant or trying to get pregnant  breast-feeding How should I use this medicine? This medicine is for injection under the skin. It is given by a health care professional in a hospital or clinic setting. A special MedGuide will be given to you before each treatment. Be sure to read this information carefully each time. For Prolia, talk to your pediatrician regarding the use of this medicine in children. Special care may be needed. For Delton See, talk to your pediatrician regarding the use of this medicine in children. While this drug may be prescribed for children as young as 13 years for selected conditions, precautions do apply. Overdosage: If you think you have taken too much of this medicine contact a poison control center or emergency room at once. NOTE: This medicine is only for you. Do not share this medicine with others. What if I miss a dose? It is important not to miss your dose. Call  your doctor or health care professional if you are unable to keep an appointment. What may interact with this medicine? Do not take this medicine with any of the following medications:  other medicines containing denosumab This medicine may also interact with the following medications:  medicines that lower your chance of fighting infection  steroid medicines like prednisone or cortisone This list may not describe all possible interactions. Give your health care provider a list of all the medicines, herbs, non-prescription drugs, or dietary supplements you use. Also tell them if you smoke, drink alcohol, or use illegal drugs. Some items may interact with your medicine. What should I watch for while using this medicine? Visit your doctor or health care professional for regular checks on your progress. Your doctor or health care professional may order blood tests and other tests to see how you are doing. Call your doctor or health care professional for advice if you get a fever, chills or sore throat, or other symptoms of a cold or flu. Do not treat yourself. This drug may decrease your body's ability to fight infection. Try to avoid being around people who are sick. You should make sure you get enough calcium and vitamin D while you are taking this medicine, unless your doctor tells  you not to. Discuss the foods you eat and the vitamins you take with your health care professional. See your dentist regularly. Brush and floss your teeth as directed. Before you have any dental work done, tell your dentist you are receiving this medicine. Do not become pregnant while taking this medicine or for 5 months after stopping it. Talk with your doctor or health care professional about your birth control options while taking this medicine. Women should inform their doctor if they wish to become pregnant or think they might be pregnant. There is a potential for serious side effects to an unborn child. Talk to your  health care professional or pharmacist for more information. What side effects may I notice from receiving this medicine? Side effects that you should report to your doctor or health care professional as soon as possible:  allergic reactions like skin rash, itching or hives, swelling of the face, lips, or tongue  bone pain  breathing problems  dizziness  jaw pain, especially after dental work  redness, blistering, peeling of the skin  signs and symptoms of infection like fever or chills; cough; sore throat; pain or trouble passing urine  signs of low calcium like fast heartbeat, muscle cramps or muscle pain; pain, tingling, numbness in the hands or feet; seizures  unusual bleeding or bruising  unusually weak or tired Side effects that usually do not require medical attention (report to your doctor or health care professional if they continue or are bothersome):  constipation  diarrhea  headache  joint pain  loss of appetite  muscle pain  runny nose  tiredness  upset stomach This list may not describe all possible side effects. Call your doctor for medical advice about side effects. You may report side effects to FDA at 1-800-FDA-1088. Where should I keep my medicine? This medicine is only given in a clinic, doctor's office, or other health care setting and will not be stored at home. NOTE: This sheet is a summary. It may not cover all possible information. If you have questions about this medicine, talk to your doctor, pharmacist, or health care provider.  2020 [employer]/Gold Standard (2018-03-20 16:10:44)  

## 2020-03-08 ENCOUNTER — Other Ambulatory Visit: Payer: Self-pay | Admitting: Cardiovascular Disease

## 2020-03-08 ENCOUNTER — Telehealth: Payer: Self-pay | Admitting: Cardiovascular Disease

## 2020-03-08 MED ORDER — HYDROCHLOROTHIAZIDE 12.5 MG PO CAPS: 12.5000 mg | ORAL_CAPSULE | Freq: Every day | ORAL | 0 refills | Status: DC

## 2020-03-08 NOTE — Telephone Encounter (Signed)
New message   Patient needs a new prescription for hydrochlorothiazide (MICROZIDE) 12.5 MG capsule sent to [place], [place] - [address]

## 2020-03-08 NOTE — Telephone Encounter (Signed)
Medication sent to pharmacy- advised that patient needed follow up appointment.

## 2020-03-10 DIAGNOSIS — Z961 Presence of intraocular lens: Secondary | ICD-10-CM | POA: Diagnosis not present

## 2020-03-10 DIAGNOSIS — H524 Presbyopia: Secondary | ICD-10-CM | POA: Diagnosis not present

## 2020-03-10 DIAGNOSIS — H52223 Regular astigmatism, bilateral: Secondary | ICD-10-CM | POA: Diagnosis not present

## 2020-03-10 DIAGNOSIS — H2512 Age-related nuclear cataract, left eye: Secondary | ICD-10-CM | POA: Diagnosis not present

## 2020-03-10 DIAGNOSIS — H5203 Hypermetropia, bilateral: Secondary | ICD-10-CM | POA: Diagnosis not present

## 2020-03-10 DIAGNOSIS — H43812 Vitreous degeneration, left eye: Secondary | ICD-10-CM | POA: Diagnosis not present

## 2020-03-14 ENCOUNTER — Telehealth: Payer: Self-pay | Admitting: Physician Assistant

## 2020-03-14 DIAGNOSIS — C7800 Secondary malignant neoplasm of unspecified lung: Secondary | ICD-10-CM

## 2020-03-14 DIAGNOSIS — C7951 Secondary malignant neoplasm of bone: Secondary | ICD-10-CM

## 2020-03-14 DIAGNOSIS — C787 Secondary malignant neoplasm of liver and intrahepatic bile duct: Secondary | ICD-10-CM

## 2020-03-14 DIAGNOSIS — C50919 Malignant neoplasm of unspecified site of unspecified female breast: Secondary | ICD-10-CM

## 2020-03-14 NOTE — Telephone Encounter (Signed)
Dr. Birdie Riddle,  Please review and let me know your thoughts on collaborating as attendings on file or if you would prefer she be set up with one of their providers. Thank you.

## 2020-03-14 NOTE — Telephone Encounter (Signed)
Caren Griffins from [facility] called stating that the pt is already in their supportive program and they feel that she could benefit from the palliative program. She wanted to know if [place]/Dr. Birdie Riddle would be the attending physician if she attends the program. Please advise   Phone # is [phone] ext. [place]

## 2020-03-14 NOTE — Telephone Encounter (Signed)
Please advise 

## 2020-03-15 NOTE — Telephone Encounter (Signed)
Spoke with Caren Griffins at [facility] of patient starting Palliative care program. She will have access to more resources and support with palliative care. Hospice is not involved in her care at this time.  Caren Griffins is needing an order for Palliative Care Fax # [phone]

## 2020-03-15 NOTE — Telephone Encounter (Signed)
I don't mind collaborating as attending on file but I do like to verify that their providers will handle any necessary after hours or on site needs (as is typical w/ most Hospice programs)

## 2020-03-15 NOTE — Telephone Encounter (Signed)
Okay to place referral to palliative care.

## 2020-03-15 NOTE — Telephone Encounter (Signed)
Let's verify with [facility] that they do have providers that will handle weekend, nighttime issues. If so Dr. Birdie Riddle agrees to be attending of Record. If not, then one of their providers will need to serve as attending.

## 2020-04-02 NOTE — Progress Notes (Signed)
Patient Care Team: Delorse Limber as PCP - General (Family Medicine)  DIAGNOSIS:    ICD-10-CM   1. Malignant neoplasm of upper-outer quadrant of left breast in female, estrogen receptor positive ([place])  C50.412    Z17.0     SUMMARY OF ONCOLOGIC HISTORY: Oncology History  Malignant neoplasm of upper-outer quadrant of left breast in female, estrogen receptor positive ([place])  07/28/2013 Mammogram   Spiculated mass at 6:00 position 3 cm, ultrasound revealed 2.6 x 2.1 x 1.5 cm second mass at 12:00 2.1 x 1.8 x 2.1 cm: 2 abnormal lymph nodes left axilla   10/29/2013 Initial Diagnosis   Breast cancer of upper-outer quadrant of left female breast: 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 [name], T3N2 (5/10 LN positive) stage IIIa IDC grade 1, ER/PR 100%, TSH 14% HER-2 negative PET/CT 12/20/2013 negative for metastases   12/17/2013 - 04/02/2014 Chemotherapy   Taxotere Cytoxan every 3 weeks for 6 cycles   05/11/2014 - 06/27/2014 Radiation Therapy   Adjuvant radiation therapy to chest wall and lymph nodes   07/06/2014 -  Anti-estrogen oral therapy   Arimidex 1 mg daily   12/21/2017 Imaging   Metastatic lesions noted in the lungs bilaterally, bilateral hilar and mediastinal lymph nodes, left pleural effusion, multiple thoracic vertebral metastases, heterogeneous lesions in the liver   12/22/2017 Procedure   Pleural Effusion Thoracentesis: Metastatic Breast cancer ER PR Positive Her 2 Neg   12/29/2017 Treatment Plan Change   Ibrance, Faslodex and Xgeva   03/09/2019 Treatment Plan Change   Everolimus and Exemestane   06/25/2019 Pathology Results   Liver biopsy: 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 with above-mentioned history of  metastatic breast cancer currently on treatment withalpelisib,Faslodexand Xgeva.She presents to the clinic todayfor a toxicity check.    ALLERGIES:  has No Known Allergies.  MEDICATIONS:  Current Outpatient Medications  Medication Sig Dispense Refill  . acetaminophen (TYLENOL) 500 MG tablet Take 1,000 mg by mouth every 6 (six) hours as needed for mild pain.    [name] alpelisib (PIQRAY 300MG DAILY DOSE) 2 x 150 MG Therapy Pack Take two 159m tablets with food at the same time daily. Swallow whole, do not crush, chew, or split. 60 each 3  . atorvastatin (LIPITOR) 20 MG tablet Take 1 tablet (20 mg total) by mouth at bedtime. 90 tablet 3  . Calcium Carb-Cholecalciferol (CALCIUM 600 + D PO) Take 1 tablet by mouth daily.    .Marland Kitchendexamethasone (DECADRON) 1 MG tablet TAKE 1 TABLET BY MOUTH EVERY DAY 30 tablet 3  . ferrous sulfate (SLOW IRON) 160 (50 Fe) MG TBCR SR tablet Take 1 tablet (160 mg total) by mouth daily. 30 tablet 0  . hydrochlorothiazide (MICROZIDE) 12.5 MG capsule TAKE 1 CAPSULE(12.5 MG) BY MOUTH DAILY 90 capsule 2  . loratadine-pseudoephedrine (CLARITIN-D 24 HOUR) 10-240 MG 24 hr tablet Take 1 tablet by mouth daily.    . Multiple Vitamin (MULTIVITAMIN WITH MINERALS) TABS tablet Take 1 tablet by mouth daily.    . nitroGLYCERIN (NITROSTAT) 0.4 MG SL tablet PLACE 1 TABLET (0.4 MG TOTAL) UNDER THE TONGUE EVERY 5 (FIVE) MINUTES AS NEEDED FOR CHEST PAIN. 25 tablet 0  . ondansetron (ZOFRAN ODT) 8 MG disintegrating tablet Take  1 tablet (8 mg total) by mouth every 8 (eight) hours as needed for nausea or vomiting. 30 tablet 3  . potassium chloride SA (KLOR-CON) 20 MEQ tablet TAKE 1 TABLET (20 MEQ TOTAL) BY MOUTH DAILY. 30 tablet 3   No current facility-administered medications for this visit.    PHYSICAL EXAMINATION: ECOG PERFORMANCE STATUS: 1 - Symptomatic but completely ambulatory  Vitals:   04/03/20 1010  BP: (!) 141/71  Pulse: 61  Resp: 17  Temp: 98 F (36.7 C)  SpO2: 99%   Filed  Weights   04/03/20 1010  Weight: 142 lb 14.4 oz (64.8 kg)    LABORATORY DATA:  I have reviewed the data as listed CMP Latest Ref Rng & Units 04/03/2020 03/06/2020 02/07/2020  Glucose 70 - 99 mg/dL 140(H) 83 99  BUN 8 - 23 mg/dL 17 19 21   Creatinine 0.44 - 1.00 mg/dL 1.50(H) 1.44(H) 1.43(H)  Sodium 135 - 145 mmol/L 143 141 139  Potassium 3.5 - 5.1 mmol/L 4.1 4.3 3.5  Chloride 98 - 111 mmol/L 98 101 97(L)  CO2 22 - 32 mmol/L 32 33(H) 31  Calcium 8.9 - 10.3 mg/dL 9.9 9.8 9.5  Total Protein 6.5 - 8.1 g/dL 7.3 6.7 7.2  Total Bilirubin 0.3 - 1.2 mg/dL 0.6 0.6 0.4  Alkaline Phos 38 - 126 U/L 68 67 72  AST 15 - 41 U/L 33 31 31  ALT 0 - 44 U/L 18 21 16     Lab Results  Component Value Date   WBC 5.9 04/03/2020   HGB 13.8 04/03/2020   HCT 43.2 04/03/2020   MCV 90.6 04/03/2020   PLT 252 04/03/2020   NEUTROABS 3.2 04/03/2020    ASSESSMENT & PLAN:  Malignant neoplasm of upper-outer quadrant of left breast in female, 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 She had radiation therapy and has been on antiestrogen therapy with Arimidex 06/28/2014-January 2019  Thoracentesis: Met Breast cancer ER PR Positive and Her 2 Neg  Ibrance with Faslodex 12/29/2017-02/27/2019 [facility] molecular testing: PIK3CA mutation present, androgen receptor positive, negative for BRCA1, 2 and PDL 1.  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 Smaller lesions also slightly increased. 5 mm to 10 mm, left hepatic lobe 18 mm to 23 mm. Enlarged periportal lymph node 12 mm to 16 mm. Bone lesions have not progressed. ------------------------------------------------------------------------------------------------------------------------------------------ Current Treatment: Alpelisib with Faslodex; Xgeva every 12 weeks Toxicities: 1.  Diarrhea: Intermittent 2.  Weight loss: Stable 3.  Cancer related back and liver pain 4.  Renal insufficiency: I discussed with her about increasing her fluid intake.  Monitoring her blood sugars.:  140  CT CAP 03/03/2020: Overall mild improvement in the multifocal liver metastases.  Slight increase in T11 vertebral body metastases.  Other lesions are stable.  Decrease in the portacaval adenopathy.  Stable left pleural effusion.  Neck scan will be planned in July  2021. Return to clinic monthly for injections labs and follow-up.    No orders of the defined types were placed in this encounter.  The patient has a good understanding of the overall plan. she agrees with it. she will call with any problems that may develop before the next visit here.  Total time spent: 30 mins including face to face time and time spent for planning, charting and coordination of care  [name] Lose, MD 04/03/2020  I, Cloyde Reams Dorshimer, am acting as scribe for Dr. [name] Lose.  I have reviewed the above documentation for accuracy and completeness, and I agree with the above.

## 2020-04-03 ENCOUNTER — Inpatient Hospital Stay: Payer: Medicare Other | Attending: Hematology and Oncology

## 2020-04-03 ENCOUNTER — Inpatient Hospital Stay: Payer: Medicare Other

## 2020-04-03 ENCOUNTER — Inpatient Hospital Stay (HOSPITAL_BASED_OUTPATIENT_CLINIC_OR_DEPARTMENT_OTHER): Payer: Medicare Other | Admitting: Hematology and Oncology

## 2020-04-03 ENCOUNTER — Other Ambulatory Visit: Payer: Self-pay

## 2020-04-03 DIAGNOSIS — G893 Neoplasm related pain (acute) (chronic): Secondary | ICD-10-CM | POA: Diagnosis not present

## 2020-04-03 DIAGNOSIS — C50412 Malignant neoplasm of upper-outer quadrant of left female breast: Secondary | ICD-10-CM | POA: Diagnosis not present

## 2020-04-03 DIAGNOSIS — C787 Secondary malignant neoplasm of liver and intrahepatic bile duct: Secondary | ICD-10-CM | POA: Diagnosis not present

## 2020-04-03 DIAGNOSIS — N289 Disorder of kidney and ureter, unspecified: Secondary | ICD-10-CM | POA: Insufficient documentation

## 2020-04-03 DIAGNOSIS — Z9221 Personal history of antineoplastic chemotherapy: Secondary | ICD-10-CM | POA: Diagnosis not present

## 2020-04-03 DIAGNOSIS — Z79811 Long term (current) use of aromatase inhibitors: Secondary | ICD-10-CM | POA: Diagnosis not present

## 2020-04-03 DIAGNOSIS — C7800 Secondary malignant neoplasm of unspecified lung: Secondary | ICD-10-CM

## 2020-04-03 DIAGNOSIS — J90 Pleural effusion, not elsewhere classified: Secondary | ICD-10-CM | POA: Diagnosis not present

## 2020-04-03 DIAGNOSIS — Z923 Personal history of irradiation: Secondary | ICD-10-CM | POA: Insufficient documentation

## 2020-04-03 DIAGNOSIS — R634 Abnormal weight loss: Secondary | ICD-10-CM | POA: Diagnosis not present

## 2020-04-03 DIAGNOSIS — Z170 Estrogen receptor positive status [ER+]: Secondary | ICD-10-CM | POA: Insufficient documentation

## 2020-04-03 DIAGNOSIS — Z5111 Encounter for antineoplastic chemotherapy: Secondary | ICD-10-CM | POA: Diagnosis not present

## 2020-04-03 DIAGNOSIS — Z9012 Acquired absence of left breast and nipple: Secondary | ICD-10-CM | POA: Diagnosis not present

## 2020-04-03 DIAGNOSIS — R197 Diarrhea, unspecified: Secondary | ICD-10-CM | POA: Insufficient documentation

## 2020-04-03 DIAGNOSIS — C7951 Secondary malignant neoplasm of bone: Secondary | ICD-10-CM | POA: Insufficient documentation

## 2020-04-03 LAB — CMP (CANCER CENTER ONLY)
ALT: 18 U/L (ref 0–44)
AST: 33 U/L (ref 15–41)
Albumin: 4 g/dL (ref 3.5–5.0)
Alkaline Phosphatase: 68 U/L (ref 38–126)
Anion gap: 13 (ref 5–15)
BUN: 17 mg/dL (ref 8–23)
CO2: 32 mmol/L (ref 22–32)
Calcium: 9.9 mg/dL (ref 8.9–10.3)
Chloride: 98 mmol/L (ref 98–111)
Creatinine: 1.5 mg/dL — ABNORMAL HIGH (ref 0.44–1.00)
GFR, Est AFR Am: 38 mL/min — ABNORMAL LOW (ref >60)
GFR, Estimated: 33 mL/min — ABNORMAL LOW (ref >60)
Glucose, Bld: 140 mg/dL — ABNORMAL HIGH (ref 70–99)
Potassium: 4.1 mmol/L (ref 3.5–5.1)
Sodium: 143 mmol/L (ref 135–145)
Total Bilirubin: 0.6 mg/dL (ref 0.3–1.2)
Total Protein: 7.3 g/dL (ref 6.5–8.1)

## 2020-04-03 LAB — CBC WITH DIFFERENTIAL (CANCER CENTER ONLY)
Abs Immature Granulocytes: 0.02 10*3/uL (ref 0.00–0.07)
Basophils Absolute: 0 10*3/uL (ref 0.0–0.1)
Basophils Relative: 1 %
Eosinophils Absolute: 0.1 10*3/uL (ref 0.0–0.5)
Eosinophils Relative: 2 %
HCT: 43.2 % (ref 36.0–46.0)
Hemoglobin: 13.8 g/dL (ref 12.0–15.0)
Immature Granulocytes: 0 %
Lymphocytes Relative: 30 %
Lymphs Abs: 1.8 10*3/uL (ref 0.7–4.0)
MCH: 28.9 pg (ref 26.0–34.0)
MCHC: 31.9 g/dL (ref 30.0–36.0)
MCV: 90.6 fL (ref 80.0–100.0)
Monocytes Absolute: 0.8 10*3/uL (ref 0.1–1.0)
Monocytes Relative: 13 %
Neutro Abs: 3.2 10*3/uL (ref 1.7–7.7)
Neutrophils Relative %: 54 %
Platelet Count: 252 10*3/uL (ref 150–400)
RBC: 4.77 MIL/uL (ref 3.87–5.11)
RDW: 19.6 % — ABNORMAL HIGH (ref 11.5–15.5)
WBC Count: 5.9 10*3/uL (ref 4.0–10.5)
nRBC: 0 % (ref 0.0–0.2)

## 2020-04-03 MED ORDER — FULVESTRANT 250 MG/5ML IM SOLN
INTRAMUSCULAR | Status: AC
  Filled 2020-04-03: qty 10

## 2020-04-03 MED ORDER — FULVESTRANT 250 MG/5ML IM SOLN
500.0000 mg | Freq: Once | INTRAMUSCULAR | Status: AC
  Administered 2020-04-03: 500 mg via INTRAMUSCULAR

## 2020-04-03 NOTE — Patient Instructions (Signed)
Fulvestrant injection What is this medicine? FULVESTRANT (ful VES trant) blocks the effects of estrogen. It is used to treat breast cancer. This medicine may be used for other purposes; ask your health care provider or pharmacist if you have questions. COMMON BRAND NAME(S): FASLODEX What should I tell my health care provider before I take this medicine? They need to know if you have any of these conditions:  bleeding disorders  liver disease  low blood counts, like low white cell, platelet, or red cell counts  an unusual or allergic reaction to fulvestrant, other medicines, foods, dyes, or preservatives  pregnant or trying to get pregnant  breast-feeding How should I use this medicine? This medicine is for injection into a muscle. It is usually given by a health care professional in a hospital or clinic setting. Talk to your pediatrician regarding the use of this medicine in children. Special care may be needed. Overdosage: If you think you have taken too much of this medicine contact a poison control center or emergency room at once. NOTE: This medicine is only for you. Do not share this medicine with others. What if I miss a dose? It is important not to miss your dose. Call your doctor or health care professional if you are unable to keep an appointment. What may interact with this medicine?  medicines that treat or prevent blood clots like warfarin, enoxaparin, dalteparin, apixaban, dabigatran, and rivaroxaban This list may not describe all possible interactions. Give your health care provider a list of all the medicines, herbs, non-prescription drugs, or dietary supplements you use. Also tell them if you smoke, drink alcohol, or use illegal drugs. Some items may interact with your medicine. What should I watch for while using this medicine? Your condition will be monitored carefully while you are receiving this medicine. You will need important blood work done while you are taking  this medicine. Do not become pregnant while taking this medicine or for at least 1 year after stopping it. Women of child-bearing potential will need to have a negative pregnancy test before starting this medicine. Women should inform their doctor if they wish to become pregnant or think they might be pregnant. There is a potential for serious side effects to an unborn child. Men should inform their doctors if they wish to father a child. This medicine may lower sperm counts. Talk to your health care professional or pharmacist for more information. Do not breast-feed an infant while taking this medicine or for 1 year after the last dose. What side effects may I notice from receiving this medicine? Side effects that you should report to your doctor or health care professional as soon as possible:  allergic reactions like skin rash, itching or hives, swelling of the face, lips, or tongue  feeling faint or lightheaded, falls  pain, tingling, numbness, or weakness in the legs  signs and symptoms of infection like fever or chills; cough; flu-like symptoms; sore throat  vaginal bleeding Side effects that usually do not require medical attention (report to your doctor or health care professional if they continue or are bothersome):  aches, pains  constipation  diarrhea  headache  hot flashes  nausea, vomiting  pain at site where injected  stomach pain This list may not describe all possible side effects. Call your doctor for medical advice about side effects. You may report side effects to FDA at 1-800-FDA-1088. Where should I keep my medicine? This drug is given in a hospital or clinic and will   not be stored at home. NOTE: This sheet is a summary. It may not cover all possible information. If you have questions about this medicine, talk to your doctor, pharmacist, or health care provider.  2020 Elsevier/Gold Standard (2018-02-19 11:34:41)  

## 2020-04-03 NOTE — Assessment & Plan Note (Signed)
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 She had radiation therapy and has been on antiestrogen therapy with Arimidex 06/28/2014-January 2019  Thoracentesis: Met Breast cancer ER PR Positive and Her 2 Neg  Ibrance with Faslodex 12/29/2017-02/27/2019 [facility] molecular testing: PIK3CA mutation present, androgen receptor positive, negative for BRCA1, 2 and PDL 1.  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 Smaller lesions also slightly increased. 5 mm to 10 mm, left hepatic lobe 18 mm to 23 mm. Enlarged periportal lymph node 12 mm to 16 mm. Bone lesions have not progressed. ------------------------------------------------------------------------------------------------------------------------------------------ Current Treatment: Alpelisib with Faslodex; Xgeva every 12 weeks Toxicities: 1.  Diarrhea 2.  Weight loss 3.  Cancer related back and liver pain  Monitoring her blood sugars.  CT CAP 03/03/2020: Overall mild improvement in the multifocal liver metastases.  Slight increase in T11 vertebral body metastases.  Other lesions are stable.  Decrease in the portacaval adenopathy.  Stable left pleural effusion.  Radiology review: Based upon response to current treatment we will continue with the same treatment plan for 3 more  months before the next scan Return to clinic monthly for injections labs and follow-up.

## 2020-04-11 ENCOUNTER — Other Ambulatory Visit: Payer: Self-pay | Admitting: Hematology and Oncology

## 2020-04-11 MED FILL — POTASSIUM CHLORIDE CRYS ER: 20 | 30 days supply | Qty: 30 | Fill #0

## 2020-04-21 ENCOUNTER — Other Ambulatory Visit: Payer: Self-pay

## 2020-04-21 ENCOUNTER — Telehealth: Payer: Self-pay

## 2020-04-21 MED ORDER — ALPELISIB (300 MG DAILY DOSE) 2 X 150 MG PO TBPK: ORAL_TABLET | ORAL | 3 refills | Status: DC

## 2020-04-21 MED FILL — POTASSIUM CHLORIDE CRYS ER: 20 | 30 days supply | Qty: 30 | Fill #0

## 2020-04-21 NOTE — Telephone Encounter (Signed)
Pt called stating she needs Piqray refilled prior to appt on 04/30/20. Prescription printed and provided for Dr [name] to authorize.

## 2020-04-25 NOTE — Telephone Encounter (Signed)
Signed prescription for [place] faxed to [facility]

## 2020-04-30 ENCOUNTER — Other Ambulatory Visit: Payer: Self-pay | Admitting: Hematology and Oncology

## 2020-04-30 NOTE — Progress Notes (Signed)
Patient Care Team: Delorse Limber as PCP - General (Family Medicine)  DIAGNOSIS:    ICD-10-CM   1. Metastatic breast cancer ([place])  C50.919   2. Malignant neoplasm of upper-outer quadrant of left breast in female, estrogen receptor positive ([place])  C50.412    Z17.0     SUMMARY OF ONCOLOGIC HISTORY: Oncology History  Malignant neoplasm of upper-outer quadrant of left breast in female, estrogen receptor positive ([place])  07/28/2013 Mammogram   Spiculated mass at 6:00 position 3 cm, ultrasound revealed 2.6 x 2.1 x 1.5 cm second mass at 12:00 2.1 x 1.8 x 2.1 cm: 2 abnormal lymph nodes left axilla   10/29/2013 Initial Diagnosis   Breast cancer of upper-outer quadrant of left female breast: 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 [name], T3N2 (5/10 LN positive) stage IIIa IDC grade 1, ER/PR 100%, TSH 14% HER-2 negative PET/CT 12/20/2013 negative for metastases   12/17/2013 - 04/02/2014 Chemotherapy   Taxotere Cytoxan every 3 weeks for 6 cycles   05/11/2014 - 06/27/2014 Radiation Therapy   Adjuvant radiation therapy to chest wall and lymph nodes   07/06/2014 -  Anti-estrogen oral therapy   Arimidex 1 mg daily   12/21/2017 Imaging   Metastatic lesions noted in the lungs bilaterally, bilateral hilar and mediastinal lymph nodes, left pleural effusion, multiple thoracic vertebral metastases, heterogeneous lesions in the liver   12/22/2017 Procedure   Pleural Effusion Thoracentesis: Metastatic Breast cancer ER PR Positive Her 2 Neg   12/29/2017 Treatment Plan Change   Ibrance, Faslodex and Xgeva   03/09/2019 Treatment Plan Change   Everolimus and Exemestane   06/25/2019 Pathology Results   Liver biopsy: 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 with above-mentioned history of metastatic breast cancer currently on treatment withalpelisib,Faslodexand Xgeva.She presents to the clinic todayfor a toxicity check.  She is complaining of neck pain related to her spine.  Is been going on for the past week or so.  She also has a mild headache.  She has had intermittent diarrhea because of which she is low on potassium.  She tells me that she has not had Piqray in about 2 weeks because of confusion regarding her prescriptions.  ALLERGIES:  has No Known Allergies.  MEDICATIONS:  Current Outpatient Medications  Medication Sig Dispense Refill  . acetaminophen (TYLENOL) 500 MG tablet Take 1,000 mg by mouth every 6 (six) hours as needed for mild pain.    [name] alpelisib (PIQRAY 300MG DAILY DOSE) 2 x 150 MG Therapy Pack Take two 172m tablets with food at the same time daily. Swallow whole, do not crush, chew, or split. 60 each 3  . atorvastatin (LIPITOR) 20 MG tablet TAKE 1 TABLET BY MOUTH EVERY NIGHT AT BEDTIME 90 tablet 3  . Calcium Carb-Cholecalciferol (CALCIUM 600 + D PO) Take 1 tablet by mouth daily.    .Marland Kitchendexamethasone (DECADRON) 1 MG tablet TAKE 1 TABLET BY MOUTH EVERY DAY 30 tablet 3  . ferrous sulfate (SLOW IRON) 160 (50 Fe) MG TBCR SR tablet Take 1 tablet (160 mg total) by mouth daily. 30 tablet 0  . hydrochlorothiazide (MICROZIDE) 12.5 MG capsule TAKE 1 CAPSULE(12.5 MG) BY MOUTH DAILY 90 capsule 2  . loratadine-pseudoephedrine (CLARITIN-D 24 HOUR)  10-240 MG 24 hr tablet Take 1 tablet by mouth daily.    . Multiple Vitamin (MULTIVITAMIN WITH MINERALS) TABS tablet Take 1 tablet by mouth daily.    . nitroGLYCERIN (NITROSTAT) 0.4 MG SL tablet PLACE 1 TABLET (0.4 MG TOTAL) UNDER THE TONGUE EVERY 5 (FIVE) MINUTES AS NEEDED FOR CHEST PAIN. 25 tablet 0  . ondansetron (ZOFRAN ODT) 8 MG disintegrating tablet Take 1 tablet (8 mg total) by mouth every 8 (eight) hours as needed for nausea or vomiting. 30 tablet 3  . potassium chloride SA  (KLOR-CON) 20 MEQ tablet TAKE 1 TABLET BY MOUTH ONCE DAILY 30 tablet 3   No current facility-administered medications for this visit.    PHYSICAL EXAMINATION: ECOG PERFORMANCE STATUS: 1 - Symptomatic but completely ambulatory  Vitals:   05/01/20 0957  BP: 111/64  Pulse: 70  Resp: 17  Temp: 98.3 F (36.8 C)  SpO2: 100%   Filed Weights   05/01/20 0957  Weight: 144 lb (65.3 kg)    LABORATORY DATA:  I have reviewed the data as listed CMP Latest Ref Rng & Units 05/01/2020 04/03/2020 03/06/2020  Glucose 70 - 99 mg/dL 94 140(H) 83  BUN 8 - 23 mg/dL _0 Creatinine 0.44 - 1.00 mg/dL 1.25(H) 1.50(H) 1.44(H)  Sodium 135 - 145 mmol/L 140 143 141  Potassium 3.5 - 5.1 mmol/L 3.1(L) 4.1 4.3  Chloride 98 - 111 mmol/L 96(L) 98 101  CO2 22 - 32 mmol/L 31 32 33(H)  Calcium 8.9 - 10.3 mg/dL 9.9 9.9 9.8  Total Protein 6.5 - 8.1 g/dL 7.2 7.3 6.7  Total Bilirubin 0.3 - 1.2 mg/dL 0.7 0.6 0.6  Alkaline Phos 38 - 126 U/L 76 68 67  AST 15 - 41 U/L 39 33 31  ALT 0 - 44 U/L _1 Lab Results  Component Value Date   WBC 5.6 05/01/2020   HGB 13.5 05/01/2020   HCT 41.2 05/01/2020   MCV 91.2 05/01/2020   PLT 233 05/01/2020   NEUTROABS 3.6 05/01/2020    ASSESSMENT & PLAN:  Malignant neoplasm of upper-outer quadrant of left breast in female, estrogen receptor positive ([place]) Left breast cancer T3, N2, 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 She had radiation therapy and has been on antiestrogen therapy with Arimidex 06/28/2014-January 2019  Thoracentesis: Met Breast cancer ER PR Positive and Her 2 Neg  Ibrance with Faslodex 12/29/2017-02/27/2019 [facility] molecular testing: PIK3CA mutation present, androgen receptor positive, negative for BRCA1, 2 and PDL 1.  Exemestane with everolimus started 03/09/2019 06/25/2019:Liver biopsy: Metastatic adenocarcinoma to  the liver consistent with breast cancer, ER 100%, PR 0%, Ki-67 10%, HER-2 -1+ by IHC I discussed the [facility] molecular testing results with the patient which showed PI K3 CA mutation as well as ER positivity.  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 Slight increase in T11 vertebral body metastases. Other lesions are stable. Decrease in the portacaval adenopathy. Stable left pleural effusion. ------------------------------------------------------------------------------------------------------------------------------------------ Current Treatment: Alpelisib with Faslodex; Xgeva every 12 weeks Toxicities: 1.Diarrhea: Intermittent 2.Weight loss: Stable 3.Neck pain: Probably related to metastatic breast cancer. 4.  Renal insufficiency: Creatinine is much better.  Logistical issues regarding delivery of Piqray: Patient is extremely frustrated and angry that  she does not have it.  I informed her pharmacy about it and they will try to figure out how to get it to her as soon as possible.  Monitoring her blood sugars.: 94  Next scan will be planned in July 2021. Return to clinic monthly for injections labs and follow-up after scans.    No orders of the defined types were placed in this encounter.  The patient has a good understanding of the overall plan. she agrees with it. she will call with any problems that may develop before the next visit here.  Total time spent: 30 mins including face to face time and time spent for planning, charting and coordination of care  [name] Lose, MD 05/01/2020  I, Cloyde Reams Dorshimer, am acting as scribe for Dr. [name] Lose.  I have reviewed the above documentation for accuracy and completeness, and I agree with the above.

## 2020-05-01 ENCOUNTER — Inpatient Hospital Stay (HOSPITAL_BASED_OUTPATIENT_CLINIC_OR_DEPARTMENT_OTHER): Payer: Medicare Other | Admitting: Hematology and Oncology

## 2020-05-01 ENCOUNTER — Inpatient Hospital Stay: Payer: Medicare Other | Attending: Hematology and Oncology

## 2020-05-01 ENCOUNTER — Telehealth: Payer: Self-pay | Admitting: Pharmacist

## 2020-05-01 ENCOUNTER — Inpatient Hospital Stay: Payer: Medicare Other

## 2020-05-01 ENCOUNTER — Other Ambulatory Visit: Payer: Self-pay

## 2020-05-01 VITALS — BP 111/64 | HR 70 | Temp 98.3°F | Resp 17 | Ht 68.0 in | Wt 144.0 lb

## 2020-05-01 DIAGNOSIS — Z5111 Encounter for antineoplastic chemotherapy: Secondary | ICD-10-CM | POA: Insufficient documentation

## 2020-05-01 DIAGNOSIS — C50919 Malignant neoplasm of unspecified site of unspecified female breast: Secondary | ICD-10-CM

## 2020-05-01 DIAGNOSIS — Z170 Estrogen receptor positive status [ER+]: Secondary | ICD-10-CM | POA: Insufficient documentation

## 2020-05-01 DIAGNOSIS — R634 Abnormal weight loss: Secondary | ICD-10-CM | POA: Insufficient documentation

## 2020-05-01 DIAGNOSIS — C50412 Malignant neoplasm of upper-outer quadrant of left female breast: Secondary | ICD-10-CM

## 2020-05-01 DIAGNOSIS — C7800 Secondary malignant neoplasm of unspecified lung: Secondary | ICD-10-CM

## 2020-05-01 DIAGNOSIS — N289 Disorder of kidney and ureter, unspecified: Secondary | ICD-10-CM | POA: Insufficient documentation

## 2020-05-01 DIAGNOSIS — Z9012 Acquired absence of left breast and nipple: Secondary | ICD-10-CM | POA: Insufficient documentation

## 2020-05-01 DIAGNOSIS — Z923 Personal history of irradiation: Secondary | ICD-10-CM | POA: Diagnosis not present

## 2020-05-01 DIAGNOSIS — C787 Secondary malignant neoplasm of liver and intrahepatic bile duct: Secondary | ICD-10-CM | POA: Insufficient documentation

## 2020-05-01 DIAGNOSIS — R519 Headache, unspecified: Secondary | ICD-10-CM | POA: Insufficient documentation

## 2020-05-01 DIAGNOSIS — R197 Diarrhea, unspecified: Secondary | ICD-10-CM | POA: Insufficient documentation

## 2020-05-01 DIAGNOSIS — M542 Cervicalgia: Secondary | ICD-10-CM | POA: Insufficient documentation

## 2020-05-01 LAB — CBC WITH DIFFERENTIAL (CANCER CENTER ONLY)
Abs Immature Granulocytes: 0.01 10*3/uL (ref 0.00–0.07)
Basophils Absolute: 0 10*3/uL (ref 0.0–0.1)
Basophils Relative: 1 %
Eosinophils Absolute: 0.1 10*3/uL (ref 0.0–0.5)
Eosinophils Relative: 2 %
HCT: 41.2 % (ref 36.0–46.0)
Hemoglobin: 13.5 g/dL (ref 12.0–15.0)
Immature Granulocytes: 0 %
Lymphocytes Relative: 15 %
Lymphs Abs: 0.8 10*3/uL (ref 0.7–4.0)
MCH: 29.9 pg (ref 26.0–34.0)
MCHC: 32.8 g/dL (ref 30.0–36.0)
MCV: 91.2 fL (ref 80.0–100.0)
Monocytes Absolute: 1 10*3/uL (ref 0.1–1.0)
Monocytes Relative: 18 %
Neutro Abs: 3.6 10*3/uL (ref 1.7–7.7)
Neutrophils Relative %: 64 %
Platelet Count: 233 10*3/uL (ref 150–400)
RBC: 4.52 MIL/uL (ref 3.87–5.11)
RDW: 18.3 % — ABNORMAL HIGH (ref 11.5–15.5)
WBC Count: 5.6 10*3/uL (ref 4.0–10.5)
nRBC: 0 % (ref 0.0–0.2)

## 2020-05-01 LAB — CMP (CANCER CENTER ONLY)
ALT: 22 U/L (ref 0–44)
AST: 39 U/L (ref 15–41)
Albumin: 3.9 g/dL (ref 3.5–5.0)
Alkaline Phosphatase: 76 U/L (ref 38–126)
Anion gap: 13 (ref 5–15)
BUN: 20 mg/dL (ref 8–23)
CO2: 31 mmol/L (ref 22–32)
Calcium: 9.9 mg/dL (ref 8.9–10.3)
Chloride: 96 mmol/L — ABNORMAL LOW (ref 98–111)
Creatinine: 1.25 mg/dL — ABNORMAL HIGH (ref 0.44–1.00)
GFR, Est AFR Am: 48 mL/min — ABNORMAL LOW (ref >60)
GFR, Estimated: 41 mL/min — ABNORMAL LOW (ref >60)
Glucose, Bld: 94 mg/dL (ref 70–99)
Potassium: 3.1 mmol/L — ABNORMAL LOW (ref 3.5–5.1)
Sodium: 140 mmol/L (ref 135–145)
Total Bilirubin: 0.7 mg/dL (ref 0.3–1.2)
Total Protein: 7.2 g/dL (ref 6.5–8.1)

## 2020-05-01 MED ORDER — FULVESTRANT 250 MG/5ML IM SOLN
INTRAMUSCULAR | Status: AC
  Filled 2020-05-01: qty 5

## 2020-05-01 MED ORDER — ALPELISIB (300 MG DAILY DOSE) 2 X 150 MG PO TBPK: ORAL_TABLET | ORAL | 3 refills | Status: DC

## 2020-05-01 MED ORDER — FULVESTRANT 250 MG/5ML IM SOLN
500.0000 mg | Freq: Once | INTRAMUSCULAR | Status: AC
  Administered 2020-05-01: 500 mg via INTRAMUSCULAR

## 2020-05-01 MED ORDER — PEGFILGRASTIM-JMDB 6 MG/0.6ML ~~LOC~~ SOSY
PREFILLED_SYRINGE | SUBCUTANEOUS | Status: AC
  Filled 2020-05-01: qty 0.6

## 2020-05-01 NOTE — Assessment & Plan Note (Signed)
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 She had radiation therapy and has been on antiestrogen therapy with Arimidex 06/28/2014-January 2019  Thoracentesis: Met Breast cancer ER PR Positive and Her 2 Neg  Ibrance with Faslodex 12/29/2017-02/27/2019 [facility] molecular testing: PIK3CA mutation present, androgen receptor positive, negative for BRCA1, 2 and PDL 1.  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 Slight increase in T11 vertebral body metastases. Other lesions are stable. Decrease in the portacaval adenopathy. Stable left pleural effusion. ------------------------------------------------------------------------------------------------------------------------------------------ Current Treatment: Alpelisib with Faslodex; Xgeva every 12 weeks Toxicities: 1.Diarrhea: Intermittent 2.Weight loss: Stable 3.Cancer related back and liver pain 4.  Renal insufficiency: I discussed with her about increasing her fluid intake.  Monitoring her blood sugars.:  140  Neck scan will be planned in July 2021. Return to clinic monthly for injections labs and follow-up after scans.

## 2020-05-01 NOTE — Telephone Encounter (Signed)
Oral Chemotherapy Pharmacist Encounter   Looks like when patient called the office for a refill prescription the [facility]. Patient enrolled in manufacture assistance. Prescription redirected to manufacturer assistance dispensing pharmacy [facility] today.  I called the assistance program and they stated that prescription was not showing in their system yet and it takes about a day for that process. I called Ms. Bellissimo to let her know. She plans to call tomorrow.  Darl Pikes, PharmD, BCPS, BCOP, CPP Hematology/Oncology Clinical Pharmacist [facility]/AP [facility] [phone]  05/01/2020 3:04 PM

## 2020-05-01 NOTE — Patient Instructions (Signed)
Fulvestrant injection What is this medicine? FULVESTRANT (ful VES trant) blocks the effects of estrogen. It is used to treat breast cancer. This medicine may be used for other purposes; ask your health care provider or pharmacist if you have questions. COMMON BRAND NAME(S): FASLODEX What should I tell my health care provider before I take this medicine? They need to know if you have any of these conditions:  bleeding disorders  liver disease  low blood counts, like low white cell, platelet, or red cell counts  an unusual or allergic reaction to fulvestrant, other medicines, foods, dyes, or preservatives  pregnant or trying to get pregnant  breast-feeding How should I use this medicine? This medicine is for injection into a muscle. It is usually given by a health care professional in a hospital or clinic setting. Talk to your pediatrician regarding the use of this medicine in children. Special care may be needed. Overdosage: If you think you have taken too much of this medicine contact a poison control center or emergency room at once. NOTE: This medicine is only for you. Do not share this medicine with others. What if I miss a dose? It is important not to miss your dose. Call your doctor or health care professional if you are unable to keep an appointment. What may interact with this medicine?  medicines that treat or prevent blood clots like warfarin, enoxaparin, dalteparin, apixaban, dabigatran, and rivaroxaban This list may not describe all possible interactions. Give your health care provider a list of all the medicines, herbs, non-prescription drugs, or dietary supplements you use. Also tell them if you smoke, drink alcohol, or use illegal drugs. Some items may interact with your medicine. What should I watch for while using this medicine? Your condition will be monitored carefully while you are receiving this medicine. You will need important blood work done while you are taking  this medicine. Do not become pregnant while taking this medicine or for at least 1 year after stopping it. Women of child-bearing potential will need to have a negative pregnancy test before starting this medicine. Women should inform their doctor if they wish to become pregnant or think they might be pregnant. There is a potential for serious side effects to an unborn child. Men should inform their doctors if they wish to father a child. This medicine may lower sperm counts. Talk to your health care professional or pharmacist for more information. Do not breast-feed an infant while taking this medicine or for 1 year after the last dose. What side effects may I notice from receiving this medicine? Side effects that you should report to your doctor or health care professional as soon as possible:  allergic reactions like skin rash, itching or hives, swelling of the face, lips, or tongue  feeling faint or lightheaded, falls  pain, tingling, numbness, or weakness in the legs  signs and symptoms of infection like fever or chills; cough; flu-like symptoms; sore throat  vaginal bleeding Side effects that usually do not require medical attention (report to your doctor or health care professional if they continue or are bothersome):  aches, pains  constipation  diarrhea  headache  hot flashes  nausea, vomiting  pain at site where injected  stomach pain This list may not describe all possible side effects. Call your doctor for medical advice about side effects. You may report side effects to FDA at 1-800-FDA-1088. Where should I keep my medicine? This drug is given in a hospital or clinic and will   not be stored at home. NOTE: This sheet is a summary. It may not cover all possible information. If you have questions about this medicine, talk to your doctor, pharmacist, or health care provider.  2020 Elsevier/Gold Standard (2018-02-19 11:34:41)  

## 2020-05-08 ENCOUNTER — Telehealth: Payer: Self-pay | Admitting: Hematology and Oncology

## 2020-05-08 NOTE — Telephone Encounter (Signed)
Scheduled per 06/07 los, patient has been called and notified of upcoming appointments.

## 2020-05-22 MED FILL — POTASSIUM CHLORIDE CRYS ER: 20 | 30 days supply | Qty: 30 | Fill #1

## 2020-05-29 NOTE — Progress Notes (Signed)
Patient Care Team: Delorse Limber as PCP - General (Family Medicine)  DIAGNOSIS:    ICD-10-CM   1. 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 [name], T3N2 (5/10 LN positive) stage IIIa IDC grade 1, ER/PR 100%, TSH 14% HER-2 negative PET/CT 12/20/2013 negative for metastases   12/17/2013 - 04/02/2014 Chemotherapy   Taxotere Cytoxan every 3 weeks for 6 cycles   05/11/2014 - 06/27/2014 Radiation Therapy   Adjuvant radiation therapy to chest wall and lymph nodes   07/06/2014 -  Anti-estrogen oral therapy   Arimidex 1 mg daily   12/21/2017 Imaging   Metastatic lesions noted in the lungs bilaterally, bilateral hilar and mediastinal lymph nodes, left pleural effusion, multiple thoracic vertebral metastases, heterogeneous lesions in the liver   12/22/2017 Procedure   Pleural Effusion Thoracentesis: Metastatic Breast cancer ER PR Positive Her 2 Neg   12/29/2017 Treatment Plan Change   Ibrance, Faslodex and Xgeva   03/09/2019 Treatment Plan Change   Everolimus and Exemestane   06/25/2019 Pathology Results   Liver biopsy: 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 with above-mentioned history of  metastatic breast cancer currently on treatment withalpelisib,Faslodexand Xgeva.She presents to the clinic todayfor a toxicity check.  ALLERGIES:  has No Known Allergies.  MEDICATIONS:  Current Outpatient Medications  Medication Sig Dispense Refill  . acetaminophen (TYLENOL) 500 MG tablet Take 1,000 mg by mouth every 6 (six) hours as needed for mild pain.    [name] alpelisib (PIQRAY 300MG DAILY DOSE) 2 x 150 MG Therapy Pack Take two 163m tablets with food at the same time daily. 56 each 3  . atorvastatin (LIPITOR) 20 MG tablet TAKE 1 TABLET BY MOUTH EVERY NIGHT AT BEDTIME 90 tablet 3  . Calcium Carb-Cholecalciferol (CALCIUM 600 + D PO) Take 1 tablet by mouth daily.    .Marland Kitchendexamethasone (DECADRON) 1 MG tablet TAKE 1 TABLET BY MOUTH EVERY DAY 30 tablet 3  . ferrous sulfate (SLOW IRON) 160 (50 Fe) MG TBCR SR tablet Take 1 tablet (160 mg total) by mouth daily. 30 tablet 0  . hydrochlorothiazide (MICROZIDE) 12.5 MG capsule TAKE 1 CAPSULE(12.5 MG) BY MOUTH DAILY 90 capsule 2  . loratadine-pseudoephedrine (CLARITIN-D 24 HOUR) 10-240 MG 24 hr tablet Take 1 tablet by mouth daily.    . Multiple Vitamin (MULTIVITAMIN WITH MINERALS) TABS tablet Take 1 tablet by mouth daily.    . nitroGLYCERIN (NITROSTAT) 0.4 MG SL tablet PLACE 1 TABLET (0.4 MG TOTAL) UNDER THE TONGUE EVERY 5 (FIVE) MINUTES AS NEEDED FOR CHEST PAIN. 25 tablet 0  . ondansetron (ZOFRAN ODT) 8 MG disintegrating tablet Take 1 tablet (8 mg total) by mouth every 8 (eight) hours  as needed for nausea or vomiting. 30 tablet 3  . potassium chloride SA (KLOR-CON) 20 MEQ tablet TAKE 1 TABLET BY MOUTH ONCE DAILY 30 tablet 3   No current facility-administered medications for this visit.    PHYSICAL EXAMINATION: ECOG PERFORMANCE STATUS: 1 - Symptomatic but completely ambulatory  Vitals:   05/30/20 0947  BP: 133/63  Pulse: (!) 58  Resp: 18  Temp: 98.2 F (36.8 C)  SpO2: 99%   Filed Weights   05/30/20 0947  Weight: 144 lb 12.8 oz (65.7 kg)      LABORATORY DATA:  I have reviewed the data as listed CMP Latest Ref Rng & Units 05/30/2020 05/01/2020 04/03/2020  Glucose 70 - 99 mg/dL 141(H) 94 140(H)  BUN 8 - 23 mg/dL 18 20 17   Creatinine 0.44 - 1.00 mg/dL 1.54(H) 1.25(H) 1.50(H)  Sodium 135 - 145 mmol/L 141 140 143  Potassium 3.5 - 5.1 mmol/L 4.3 3.1(L) 4.1  Chloride 98 - 111 mmol/L 100 96(L) 98  CO2 22 - 32 mmol/L 32 31 32  Calcium 8.9 - 10.3 mg/dL 10.2 9.9 9.9  Total Protein 6.5 - 8.1 g/dL 7.3 7.2 7.3  Total Bilirubin 0.3 - 1.2 mg/dL 0.6 0.7 0.6  Alkaline Phos 38 - 126 U/L 91 76 68  AST 15 - 41 U/L 48(H) 39 33  ALT 0 - 44 U/L 30 22 18     Lab Results  Component Value Date   WBC 6.6 05/30/2020   HGB 13.6 05/30/2020   HCT 41.9 05/30/2020   MCV 93.5 05/30/2020   PLT 218 05/30/2020   NEUTROABS 3.3 05/30/2020    ASSESSMENT & PLAN:  Malignant neoplasm of upper-outer quadrant of left breast in female, 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 She had radiation therapy and has been on antiestrogen therapy with Arimidex 06/28/2014-January 2019  Thoracentesis: Met Breast cancer ER PR Positive and Her 2 Neg  Ibrance with Faslodex 12/29/2017-02/27/2019 [facility] molecular testing: PIK3CA mutation present, androgen receptor positive, negative for BRCA1, 2 and PDL 1.  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 Slight increase in T11 vertebral body metastases. Other lesions are stable. Decrease in the portacaval adenopathy. Stable left pleural effusion. ------------------------------------------------------------------------------------------------------------------------------------------ Current Treatment: Alpelisib with Faslodex; Xgeva every 12 weeks Toxicities: 1.Diarrhea: Intermittent 2.Weight loss: Patient gained a few pounds 3.Neck pain: Probably related to metastatic breast cancer. 4.Renal insufficiency: Creatinine continues to fluctuate  Monitoring her blood sugars.: 140 (non fasting)  Next scan will be planned in August 2021. Return to clinic monthly for injections labs and follow-up after scans.   No orders of the defined types were placed in this encounter.  The patient has a good understanding of the overall plan. she agrees with it. she will call with any problems that may develop before the next visit here.  Total time spent: 30 mins including face to face time and time spent for planning, charting and  coordination of care  [name] Lose, MD 05/30/2020  Wendy [name], am acting as scribe for Dr. [name] Lose.  I have reviewed the above documentation for accuracy and completeness, and I agree with the above.

## 2020-05-30 ENCOUNTER — Other Ambulatory Visit: Payer: Self-pay

## 2020-05-30 ENCOUNTER — Inpatient Hospital Stay: Payer: Medicare Other

## 2020-05-30 ENCOUNTER — Inpatient Hospital Stay: Payer: Medicare Other | Attending: Hematology and Oncology

## 2020-05-30 ENCOUNTER — Inpatient Hospital Stay (HOSPITAL_BASED_OUTPATIENT_CLINIC_OR_DEPARTMENT_OTHER): Payer: Medicare Other | Admitting: Hematology and Oncology

## 2020-05-30 VITALS — BP 133/63 | HR 58 | Temp 98.2°F | Resp 18 | Ht 68.0 in | Wt 144.8 lb

## 2020-05-30 DIAGNOSIS — Z170 Estrogen receptor positive status [ER+]: Secondary | ICD-10-CM

## 2020-05-30 DIAGNOSIS — R197 Diarrhea, unspecified: Secondary | ICD-10-CM | POA: Diagnosis not present

## 2020-05-30 DIAGNOSIS — C787 Secondary malignant neoplasm of liver and intrahepatic bile duct: Secondary | ICD-10-CM | POA: Insufficient documentation

## 2020-05-30 DIAGNOSIS — M542 Cervicalgia: Secondary | ICD-10-CM | POA: Diagnosis not present

## 2020-05-30 DIAGNOSIS — C50412 Malignant neoplasm of upper-outer quadrant of left female breast: Secondary | ICD-10-CM

## 2020-05-30 DIAGNOSIS — R634 Abnormal weight loss: Secondary | ICD-10-CM | POA: Diagnosis not present

## 2020-05-30 DIAGNOSIS — C50919 Malignant neoplasm of unspecified site of unspecified female breast: Secondary | ICD-10-CM | POA: Diagnosis not present

## 2020-05-30 DIAGNOSIS — Z5111 Encounter for antineoplastic chemotherapy: Secondary | ICD-10-CM | POA: Insufficient documentation

## 2020-05-30 DIAGNOSIS — Z9012 Acquired absence of left breast and nipple: Secondary | ICD-10-CM | POA: Insufficient documentation

## 2020-05-30 DIAGNOSIS — Z923 Personal history of irradiation: Secondary | ICD-10-CM | POA: Diagnosis not present

## 2020-05-30 DIAGNOSIS — N289 Disorder of kidney and ureter, unspecified: Secondary | ICD-10-CM | POA: Diagnosis not present

## 2020-05-30 DIAGNOSIS — C7800 Secondary malignant neoplasm of unspecified lung: Secondary | ICD-10-CM

## 2020-05-30 LAB — CBC WITH DIFFERENTIAL (CANCER CENTER ONLY)
Abs Immature Granulocytes: 0.02 10*3/uL (ref 0.00–0.07)
Basophils Absolute: 0.1 10*3/uL (ref 0.0–0.1)
Basophils Relative: 1 %
Eosinophils Absolute: 0.3 10*3/uL (ref 0.0–0.5)
Eosinophils Relative: 4 %
HCT: 41.9 % (ref 36.0–46.0)
Hemoglobin: 13.6 g/dL (ref 12.0–15.0)
Immature Granulocytes: 0 %
Lymphocytes Relative: 33 %
Lymphs Abs: 2.2 10*3/uL (ref 0.7–4.0)
MCH: 30.4 pg (ref 26.0–34.0)
MCHC: 32.5 g/dL (ref 30.0–36.0)
MCV: 93.5 fL (ref 80.0–100.0)
Monocytes Absolute: 0.8 10*3/uL (ref 0.1–1.0)
Monocytes Relative: 11 %
Neutro Abs: 3.3 10*3/uL (ref 1.7–7.7)
Neutrophils Relative %: 51 %
Platelet Count: 218 10*3/uL (ref 150–400)
RBC: 4.48 MIL/uL (ref 3.87–5.11)
RDW: 18.3 % — ABNORMAL HIGH (ref 11.5–15.5)
WBC Count: 6.6 10*3/uL (ref 4.0–10.5)
nRBC: 0 % (ref 0.0–0.2)

## 2020-05-30 LAB — CMP (CANCER CENTER ONLY)
ALT: 30 U/L (ref 0–44)
AST: 48 U/L — ABNORMAL HIGH (ref 15–41)
Albumin: 4 g/dL (ref 3.5–5.0)
Alkaline Phosphatase: 91 U/L (ref 38–126)
Anion gap: 9 (ref 5–15)
BUN: 18 mg/dL (ref 8–23)
CO2: 32 mmol/L (ref 22–32)
Calcium: 10.2 mg/dL (ref 8.9–10.3)
Chloride: 100 mmol/L (ref 98–111)
Creatinine: 1.54 mg/dL — ABNORMAL HIGH (ref 0.44–1.00)
GFR, Est AFR Am: 37 mL/min — ABNORMAL LOW (ref >60)
GFR, Estimated: 32 mL/min — ABNORMAL LOW (ref >60)
Glucose, Bld: 141 mg/dL — ABNORMAL HIGH (ref 70–99)
Potassium: 4.3 mmol/L (ref 3.5–5.1)
Sodium: 141 mmol/L (ref 135–145)
Total Bilirubin: 0.6 mg/dL (ref 0.3–1.2)
Total Protein: 7.3 g/dL (ref 6.5–8.1)

## 2020-05-30 MED ORDER — DENOSUMAB 120 MG/1.7ML ~~LOC~~ SOLN
SUBCUTANEOUS | Status: AC
  Filled 2020-05-30: qty 1.7

## 2020-05-30 MED ORDER — FULVESTRANT 250 MG/5ML IM SOLN
INTRAMUSCULAR | Status: AC
  Filled 2020-05-30: qty 5

## 2020-05-30 MED ORDER — FULVESTRANT 250 MG/5ML IM SOLN
500.0000 mg | Freq: Once | INTRAMUSCULAR | Status: AC
  Administered 2020-05-30: 500 mg via INTRAMUSCULAR

## 2020-05-30 MED ORDER — DENOSUMAB 120 MG/1.7ML ~~LOC~~ SOLN
120.0000 mg | Freq: Once | SUBCUTANEOUS | Status: AC
  Administered 2020-05-30: 120 mg via SUBCUTANEOUS

## 2020-05-30 NOTE — Assessment & Plan Note (Signed)
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 She had radiation therapy and has been on antiestrogen therapy with Arimidex 06/28/2014-January 2019  Thoracentesis: Met Breast cancer ER PR Positive and Her 2 Neg  Ibrance with Faslodex 12/29/2017-02/27/2019 [facility] molecular testing: PIK3CA mutation present, androgen receptor positive, negative for BRCA1, 2 and PDL 1.  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 Slight increase in T11 vertebral body metastases. Other lesions are stable. Decrease in the portacaval adenopathy. Stable left pleural effusion. ------------------------------------------------------------------------------------------------------------------------------------------ Current Treatment: Alpelisib with Faslodex; Xgeva every 12 weeks Toxicities: 1.Diarrhea: Intermittent 2.Weight loss: Stable 3.Neck pain: Probably related to metastatic breast cancer. 4.Renal insufficiency: Creatinine is much better.  Logistical issues regarding delivery of Piqray: Patient is extremely frustrated and angry that she does not have it.  I informed her pharmacy about it and they will try to figure out how to get it to her as soon as possible.  Monitoring her blood sugars.: 94  Next scan will be planned in July 2021. Return to  clinic monthly for injections labs and follow-up after scans.

## 2020-05-30 NOTE — Patient Instructions (Signed)
Denosumab injection What is this medicine? DENOSUMAB (den oh sue mab) slows bone breakdown. Prolia is used to treat osteoporosis in women after menopause and in men, and in people who are taking corticosteroids for 6 months or more. Xgeva is used to treat a high calcium level due to cancer and to prevent bone fractures and other bone problems caused by multiple myeloma or cancer bone metastases. Xgeva is also used to treat giant cell tumor of the bone. This medicine may be used for other purposes; ask your health care provider or pharmacist if you have questions. COMMON BRAND NAME(S): Prolia, XGEVA What should I tell my health care provider before I take this medicine? They need to know if you have any of these conditions:  dental disease  having surgery or tooth extraction  infection  kidney disease  low levels of calcium or Vitamin D in the blood  malnutrition  on hemodialysis  skin conditions or sensitivity  thyroid or parathyroid disease  an unusual reaction to denosumab, other medicines, foods, dyes, or preservatives  pregnant or trying to get pregnant  breast-feeding How should I use this medicine? This medicine is for injection under the skin. It is given by a health care professional in a hospital or clinic setting. A special MedGuide will be given to you before each treatment. Be sure to read this information carefully each time. For Prolia, talk to your pediatrician regarding the use of this medicine in children. Special care may be needed. For Xgeva, talk to your pediatrician regarding the use of this medicine in children. While this drug may be prescribed for children as young as 13 years for selected conditions, precautions do apply. Overdosage: If you think you have taken too much of this medicine contact a poison control center or emergency room at once. NOTE: This medicine is only for you. Do not share this medicine with others. What if I miss a dose? It is  important not to miss your dose. Call your doctor or health care professional if you are unable to keep an appointment. What may interact with this medicine? Do not take this medicine with any of the following medications:  other medicines containing denosumab This medicine may also interact with the following medications:  medicines that lower your chance of fighting infection  steroid medicines like prednisone or cortisone This list may not describe all possible interactions. Give your health care provider a list of all the medicines, herbs, non-prescription drugs, or dietary supplements you use. Also tell them if you smoke, drink alcohol, or use illegal drugs. Some items may interact with your medicine. What should I watch for while using this medicine? Visit your doctor or health care professional for regular checks on your progress. Your doctor or health care professional may order blood tests and other tests to see how you are doing. Call your doctor or health care professional for advice if you get a fever, chills or sore throat, or other symptoms of a cold or flu. Do not treat yourself. This drug may decrease your body's ability to fight infection. Try to avoid being around people who are sick. You should make sure you get enough calcium and vitamin D while you are taking this medicine, unless your doctor tells you not to. Discuss the foods you eat and the vitamins you take with your health care professional. See your dentist regularly. Brush and floss your teeth as directed. Before you have any dental work done, tell your dentist you are   receiving this medicine. Do not become pregnant while taking this medicine or for 5 months after stopping it. Talk with your doctor or health care professional about your birth control options while taking this medicine. Women should inform their doctor if they wish to become pregnant or think they might be pregnant. There is a potential for serious side  effects to an unborn child. Talk to your health care professional or pharmacist for more information. What side effects may I notice from receiving this medicine? Side effects that you should report to your doctor or health care professional as soon as possible:  allergic reactions like skin rash, itching or hives, swelling of the face, lips, or tongue  bone pain  breathing problems  dizziness  jaw pain, especially after dental work  redness, blistering, peeling of the skin  signs and symptoms of infection like fever or chills; cough; sore throat; pain or trouble passing urine  signs of low calcium like fast heartbeat, muscle cramps or muscle pain; pain, tingling, numbness in the hands or feet; seizures  unusual bleeding or bruising  unusually weak or tired Side effects that usually do not require medical attention (report to your doctor or health care professional if they continue or are bothersome):  constipation  diarrhea  headache  joint pain  loss of appetite  muscle pain  runny nose  tiredness  upset stomach This list may not describe all possible side effects. Call your doctor for medical advice about side effects. You may report side effects to FDA at 1-800-FDA-1088. Where should I keep my medicine? This medicine is only given in a clinic, doctor's office, or other health care setting and will not be stored at home. NOTE: This sheet is a summary. It may not cover all possible information. If you have questions about this medicine, talk to your doctor, pharmacist, or health care provider.  2020 Elsevier/Gold Standard (2018-03-20 16:10:44) Fulvestrant injection What is this medicine? FULVESTRANT (ful VES trant) blocks the effects of estrogen. It is used to treat breast cancer. This medicine may be used for other purposes; ask your health care provider or pharmacist if you have questions. COMMON BRAND NAME(S): FASLODEX What should I tell my health care  provider before I take this medicine? They need to know if you have any of these conditions:  bleeding disorders  liver disease  low blood counts, like low white cell, platelet, or red cell counts  an unusual or allergic reaction to fulvestrant, other medicines, foods, dyes, or preservatives  pregnant or trying to get pregnant  breast-feeding How should I use this medicine? This medicine is for injection into a muscle. It is usually given by a health care professional in a hospital or clinic setting. Talk to your pediatrician regarding the use of this medicine in children. Special care may be needed. Overdosage: If you think you have taken too much of this medicine contact a poison control center or emergency room at once. NOTE: This medicine is only for you. Do not share this medicine with others. What if I miss a dose? It is important not to miss your dose. Call your doctor or health care professional if you are unable to keep an appointment. What may interact with this medicine?  medicines that treat or prevent blood clots like warfarin, enoxaparin, dalteparin, apixaban, dabigatran, and rivaroxaban This list may not describe all possible interactions. Give your health care provider a list of all the medicines, herbs, non-prescription drugs, or dietary supplements you use.   Also tell them if you smoke, drink alcohol, or use illegal drugs. Some items may interact with your medicine. What should I watch for while using this medicine? Your condition will be monitored carefully while you are receiving this medicine. You will need important blood work done while you are taking this medicine. Do not become pregnant while taking this medicine or for at least 1 year after stopping it. Women of child-bearing potential will need to have a negative pregnancy test before starting this medicine. Women should inform their doctor if they wish to become pregnant or think they might be pregnant. There is  a potential for serious side effects to an unborn child. Men should inform their doctors if they wish to father a child. This medicine may lower sperm counts. Talk to your health care professional or pharmacist for more information. Do not breast-feed an infant while taking this medicine or for 1 year after the last dose. What side effects may I notice from receiving this medicine? Side effects that you should report to your doctor or health care professional as soon as possible:  allergic reactions like skin rash, itching or hives, swelling of the face, lips, or tongue  feeling faint or lightheaded, falls  pain, tingling, numbness, or weakness in the legs  signs and symptoms of infection like fever or chills; cough; flu-like symptoms; sore throat  vaginal bleeding Side effects that usually do not require medical attention (report to your doctor or health care professional if they continue or are bothersome):  aches, pains  constipation  diarrhea  headache  hot flashes  nausea, vomiting  pain at site where injected  stomach pain This list may not describe all possible side effects. Call your doctor for medical advice about side effects. You may report side effects to FDA at 1-800-FDA-1088. Where should I keep my medicine? This drug is given in a hospital or clinic and will not be stored at home. NOTE: This sheet is a summary. It may not cover all possible information. If you have questions about this medicine, talk to your doctor, pharmacist, or health care provider.  2020 Elsevier/Gold Standard (2018-02-19 11:34:41)  

## 2020-05-31 ENCOUNTER — Telehealth: Payer: Self-pay | Admitting: Hematology and Oncology

## 2020-05-31 NOTE — Telephone Encounter (Signed)
Per 7/6, no changes made to pt schedule

## 2020-06-20 DIAGNOSIS — Z515 Encounter for palliative care: Secondary | ICD-10-CM | POA: Diagnosis not present

## 2020-06-20 DIAGNOSIS — C787 Secondary malignant neoplasm of liver and intrahepatic bile duct: Secondary | ICD-10-CM | POA: Diagnosis not present

## 2020-06-20 DIAGNOSIS — C7951 Secondary malignant neoplasm of bone: Secondary | ICD-10-CM | POA: Diagnosis not present

## 2020-06-20 DIAGNOSIS — C50412 Malignant neoplasm of upper-outer quadrant of left female breast: Secondary | ICD-10-CM | POA: Diagnosis not present

## 2020-06-23 MED FILL — POTASSIUM CHLORIDE CRYS ER: 20 | 30 days supply | Qty: 30 | Fill #2

## 2020-06-27 ENCOUNTER — Ambulatory Visit: Payer: Medicare Other

## 2020-06-27 ENCOUNTER — Ambulatory Visit: Payer: Medicare Other | Admitting: Hematology and Oncology

## 2020-06-27 ENCOUNTER — Other Ambulatory Visit: Payer: Medicare Other

## 2020-06-30 ENCOUNTER — Ambulatory Visit (HOSPITAL_COMMUNITY)
Admission: RE | Admit: 2020-06-30 | Discharge: 2020-06-30 | Disposition: A | Discharge status: Home / Self Care | Payer: Medicare Other | Source: Ambulatory Visit | Attending: Hematology and Oncology | Admitting: Hematology and Oncology

## 2020-06-30 ENCOUNTER — Other Ambulatory Visit: Payer: Self-pay | Admitting: Oncology

## 2020-06-30 ENCOUNTER — Inpatient Hospital Stay: Payer: Medicare Other | Attending: Hematology and Oncology

## 2020-06-30 ENCOUNTER — Other Ambulatory Visit: Payer: Self-pay

## 2020-06-30 DIAGNOSIS — J90 Pleural effusion, not elsewhere classified: Secondary | ICD-10-CM | POA: Insufficient documentation

## 2020-06-30 DIAGNOSIS — R197 Diarrhea, unspecified: Secondary | ICD-10-CM | POA: Diagnosis not present

## 2020-06-30 DIAGNOSIS — N289 Disorder of kidney and ureter, unspecified: Secondary | ICD-10-CM | POA: Diagnosis not present

## 2020-06-30 DIAGNOSIS — C50412 Malignant neoplasm of upper-outer quadrant of left female breast: Secondary | ICD-10-CM | POA: Diagnosis not present

## 2020-06-30 DIAGNOSIS — M542 Cervicalgia: Secondary | ICD-10-CM | POA: Diagnosis not present

## 2020-06-30 DIAGNOSIS — C50919 Malignant neoplasm of unspecified site of unspecified female breast: Secondary | ICD-10-CM | POA: Insufficient documentation

## 2020-06-30 DIAGNOSIS — C787 Secondary malignant neoplasm of liver and intrahepatic bile duct: Secondary | ICD-10-CM | POA: Insufficient documentation

## 2020-06-30 DIAGNOSIS — Z923 Personal history of irradiation: Secondary | ICD-10-CM | POA: Diagnosis not present

## 2020-06-30 DIAGNOSIS — Z170 Estrogen receptor positive status [ER+]: Secondary | ICD-10-CM | POA: Diagnosis not present

## 2020-06-30 DIAGNOSIS — Z5111 Encounter for antineoplastic chemotherapy: Secondary | ICD-10-CM | POA: Diagnosis not present

## 2020-06-30 DIAGNOSIS — C7800 Secondary malignant neoplasm of unspecified lung: Secondary | ICD-10-CM

## 2020-06-30 DIAGNOSIS — Z9012 Acquired absence of left breast and nipple: Secondary | ICD-10-CM | POA: Insufficient documentation

## 2020-06-30 DIAGNOSIS — C7951 Secondary malignant neoplasm of bone: Secondary | ICD-10-CM | POA: Diagnosis not present

## 2020-06-30 DIAGNOSIS — R634 Abnormal weight loss: Secondary | ICD-10-CM | POA: Diagnosis not present

## 2020-06-30 LAB — CBC WITH DIFFERENTIAL (CANCER CENTER ONLY)
Abs Immature Granulocytes: 0.02 10*3/uL (ref 0.00–0.07)
Basophils Absolute: 0.1 10*3/uL (ref 0.0–0.1)
Basophils Relative: 1 %
Eosinophils Absolute: 0.3 10*3/uL (ref 0.0–0.5)
Eosinophils Relative: 5 %
HCT: 42.9 % (ref 36.0–46.0)
Hemoglobin: 13.9 g/dL (ref 12.0–15.0)
Immature Granulocytes: 0 %
Lymphocytes Relative: 18 %
Lymphs Abs: 1 10*3/uL (ref 0.7–4.0)
MCH: 30.6 pg (ref 26.0–34.0)
MCHC: 32.4 g/dL (ref 30.0–36.0)
MCV: 94.5 fL (ref 80.0–100.0)
Monocytes Absolute: 0.6 10*3/uL (ref 0.1–1.0)
Monocytes Relative: 11 %
Neutro Abs: 3.7 10*3/uL (ref 1.7–7.7)
Neutrophils Relative %: 65 %
Platelet Count: 231 10*3/uL (ref 150–400)
RBC: 4.54 MIL/uL (ref 3.87–5.11)
RDW: 17 % — ABNORMAL HIGH (ref 11.5–15.5)
WBC Count: 5.7 10*3/uL (ref 4.0–10.5)
nRBC: 0 % (ref 0.0–0.2)

## 2020-06-30 LAB — CMP (CANCER CENTER ONLY)
ALT: 40 U/L (ref 0–44)
AST: 57 U/L — ABNORMAL HIGH (ref 15–41)
Albumin: 4.2 g/dL (ref 3.5–5.0)
Alkaline Phosphatase: 203 U/L — ABNORMAL HIGH (ref 38–126)
Anion gap: 11 (ref 5–15)
BUN: 18 mg/dL (ref 8–23)
CO2: 33 mmol/L — ABNORMAL HIGH (ref 22–32)
Calcium: 10.4 mg/dL — ABNORMAL HIGH (ref 8.9–10.3)
Chloride: 97 mmol/L — ABNORMAL LOW (ref 98–111)
Creatinine: 1.47 mg/dL — ABNORMAL HIGH (ref 0.44–1.00)
GFR, Est AFR Am: 39 mL/min — ABNORMAL LOW (ref >60)
GFR, Estimated: 34 mL/min — ABNORMAL LOW (ref >60)
Glucose, Bld: 86 mg/dL (ref 70–99)
Potassium: 3.8 mmol/L (ref 3.5–5.1)
Sodium: 141 mmol/L (ref 135–145)
Total Bilirubin: 0.7 mg/dL (ref 0.3–1.2)
Total Protein: 7.8 g/dL (ref 6.5–8.1)

## 2020-06-30 MED ORDER — SODIUM CHLORIDE (PF) 0.9 % IJ SOLN
INTRAMUSCULAR | Status: AC
  Filled 2020-06-30: qty 50

## 2020-06-30 MED ORDER — IOHEXOL 300 MG/ML  SOLN
100.0000 mL | Freq: Once | INTRAMUSCULAR | Status: AC | PRN
  Administered 2020-06-30: 75 mL via INTRAVENOUS

## 2020-06-30 NOTE — Progress Notes (Signed)
The results of the CT scan were discussed with the patient given her abnormal partial cecal twist without any clear obstruction.  She is not experiencing any clinical signs or symptoms of GI distress.  She denies any abdominal pain, distention or vomiting.  She has been able to move her bowels every time she is wanting to.  She does report alteration constipation and diarrhea which is unchanged.  At this time, these radiological findings are not impacting her clinically at this time.  I gave her instructions to report any symptoms of abdominal pain, distention or inability to move her bowels over the weekend.  He has follow-up with Dr. [name] next week.   All her questions are answered.

## 2020-07-02 NOTE — Progress Notes (Signed)
 Patient Care Team: Martin, William C, [name] as PCP - General (Family Medicine)  DIAGNOSIS:    ICD-10-CM   1. Malignant neoplasm of upper-outer quadrant of left breast in female, estrogen receptor positive (HCC)  C50.412    Z17.0     SUMMARY OF ONCOLOGIC HISTORY: Oncology History  Malignant neoplasm of upper-outer quadrant of left breast in female, estrogen receptor positive (HCC)  07/28/2013 Mammogram   Spiculated mass at 6:00 position 3 cm, ultrasound revealed 2.6 x 2.1 x 1.5 cm second mass at 12:00 2.1 x 1.8 x 2.1 cm: 2 abnormal lymph nodes left axilla   10/29/2013 Initial Diagnosis   Breast cancer of upper-outer quadrant of left female breast: 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 Wake[place], T3N2 (5/10 LN positive) stage IIIa IDC grade 1, ER/PR 100%, TSH 14% HER-2 negative PET/CT 12/20/2013 negative for metastases   12/17/2013 - 04/02/2014 Chemotherapy   Taxotere Cytoxan every 3 weeks for 6 cycles   05/11/2014 - 06/27/2014 Radiation Therapy   Adjuvant radiation therapy to chest wall and lymph nodes   07/06/2014 -  Anti-estrogen oral therapy   Arimidex 1 mg daily   12/21/2017 Imaging   Metastatic lesions noted in the lungs bilaterally, bilateral hilar and mediastinal lymph nodes, left pleural effusion, multiple thoracic vertebral metastases, heterogeneous lesions in the liver   12/22/2017 Procedure   Pleural Effusion Thoracentesis: Metastatic Breast cancer ER PR Positive Her 2 Neg   12/29/2017 Treatment Plan Change   Ibrance, Faslodex and Xgeva   03/09/2019 Treatment Plan Change   Everolimus and Exemestane   06/25/2019 Pathology Results   Liver biopsy: 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, ER 100%, PR 0%, Ki-67 10%, HER-2 -1+ by IHC   12/13/2019 Miscellaneous   Piqray with Falsodex and Xgeva     CHIEF COMPLIANT: Follow-up of metastatic breast cancer, review scans    INTERVAL HISTORY: Wendy [name] is a 78 y.o. with above-mentioned history of metastatic breast cancer currently on treatment withalpelisib,Faslodexand Xgeva.CT CAP on 06/30/20 showed stable hepatic metastases, osseous metastases, and left pleural effusions. She presents to the clinic todayfor a toxicity check and to review her scans.  Her major symptoms of toxicities include nausea and fatigue.  She denies any abdominal pain or bloating or distention.  The CT scans show significant changes in the bowel but she does not have any symptoms.  She sees Dr. Medoff of with gastroenterology at [place].  ALLERGIES:  has No Known Allergies.  MEDICATIONS:  Current Outpatient Medications  Medication Sig Dispense Refill  . acetaminophen (TYLENOL) 500 MG tablet Take 1,000 mg by mouth every 6 (six) hours as needed for mild pain.    . alpelisib (PIQRAY 300MG DAILY DOSE) 2 x 150 MG Therapy Pack Take two 150mg tablets with food at the same time daily. 56 each 3  . atorvastatin (LIPITOR) 20 MG tablet TAKE 1 TABLET BY MOUTH EVERY NIGHT AT BEDTIME 90 tablet 3  . Calcium Carb-Cholecalciferol (CALCIUM 600 + D PO) Take 1 tablet by mouth daily.    . ferrous sulfate (SLOW IRON) 160 (50 Fe) MG TBCR SR tablet Take 1 tablet (160 mg total) by mouth daily. 30 tablet 0  . hydrochlorothiazide (MICROZIDE) 12.5 MG capsule TAKE 1 CAPSULE(12.5 MG) BY MOUTH DAILY 90 capsule 2  . loratadine-pseudoephedrine (CLARITIN-D 24 HOUR) 10-240 MG 24 hr tablet Take 1 tablet by mouth daily.    . Multiple   Vitamin (MULTIVITAMIN WITH MINERALS) TABS tablet Take 1 tablet by mouth daily.    . nitroGLYCERIN (NITROSTAT) 0.4 MG SL tablet PLACE 1 TABLET (0.4 MG TOTAL) UNDER THE TONGUE EVERY 5 (FIVE) MINUTES AS NEEDED FOR CHEST PAIN. 25 tablet 0  . ondansetron (ZOFRAN ODT) 8 MG disintegrating tablet Take 1 tablet (8 mg total) by mouth every 8 (eight) hours as needed for nausea or vomiting. 30 tablet 3  . potassium chloride SA (KLOR-CON) 20 MEQ  tablet TAKE 1 TABLET BY MOUTH ONCE DAILY 30 tablet 3   No current facility-administered medications for this visit.    PHYSICAL EXAMINATION: ECOG PERFORMANCE STATUS: 1 - Symptomatic but completely ambulatory  Vitals:   07/03/20 1149  BP: (!) 150/68  Pulse: (!) 59  Resp: 17  Temp: (!) 97.2 F (36.2 C)  SpO2: 97%   Filed Weights   07/03/20 1149  Weight: 145 lb 6.4 oz (66 kg)    LABORATORY DATA:  I have reviewed the data as listed CMP Latest Ref Rng & Units 06/30/2020 05/30/2020 05/01/2020  Glucose 70 - 99 mg/dL 86 141(H) 94  BUN 8 - 23 mg/dL _0 Creatinine 0.44 - 1.00 mg/dL 1.47(H) 1.54(H) 1.25(H)  Sodium 135 - 145 mmol/L 141 141 140  Potassium 3.5 - 5.1 mmol/L 3.8 4.3 3.1(L)  Chloride 98 - 111 mmol/L 97(L) 100 96(L)  CO2 22 - 32 mmol/L 33(H) 32 31  Calcium 8.9 - 10.3 mg/dL 10.4(H) 10.2 9.9  Total Protein 6.5 - 8.1 g/dL 7.8 7.3 7.2  Total Bilirubin 0.3 - 1.2 mg/dL 0.7 0.6 0.7  Alkaline Phos 38 - 126 U/L 203(H) 91 76  AST 15 - 41 U/L 57(H) 48(H) 39  ALT 0 - 44 U/L 40 30 22    Lab Results  Component Value Date   WBC 5.7 06/30/2020   HGB 13.9 06/30/2020   HCT 42.9 06/30/2020   MCV 94.5 06/30/2020   PLT 231 06/30/2020   NEUTROABS 3.7 06/30/2020    ASSESSMENT & PLAN:  Malignant neoplasm of upper-outer quadrant of left breast in female, 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 She had radiation therapy and has been on antiestrogen therapy with Arimidex 06/28/2014-January 2019  Thoracentesis: Met Breast cancer ER PR Positive and Her 2 Neg  Ibrance with Faslodex 12/29/2017-02/27/2019 [facility] molecular testing: PIK3CA mutation present, androgen receptor positive, negative for BRCA1, 2 and PDL 1.  Exemestane with everolimus started 03/09/2019 06/25/2019:Liver biopsy: Metastatic  adenocarcinoma to the liver consistent with breast cancer, ER 100%, PR 0%, Ki-67 10%, HER-2 -1+ by IHC I discussed the [facility] molecular testing results with the patient which showed PI K3 CA mutation as well as ER positivity.  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 Slight increase in T11 vertebral body metastases. Other lesions are stable. Decrease in the portacaval adenopathy. Stable left pleural effusion. ------------------------------------------------------------------------------------------------------------------------------------------ Current Treatment: Alpelisib with Faslodex; Xgeva every 12 weeks Toxicities: 1.Diarrhea: Intermittent 2.Weight loss: Patient gained a few pounds 3.Neck pain: Probably related to metastatic breast cancer. 4.Renal insufficiency:Creatinine continues to fluctuate  Monitoring her blood sugars.:86  CT CAP 06/30/2020:cecal bascule with partial twist without frank volvulus of the ileocecal mesentery hepatic metastases slight decrease,  bone metastases stable, unchanged left pleural effusion Patient's gastroenterologist is [name].  He is with [facility]. We will send a copy of the scan to his office for him to evaluate. Patient is asymptomatic and I am not sure how to manage the CT findings.  Return to clinic monthly for injections labs and follow-up in 1 month.    No orders of the defined types were placed in this encounter.  The patient has a good understanding of the overall plan. she agrees with it. she will call with any problems that may develop before the next visit here.  Total time spent: 30 mins including face to face time and time spent for planning, charting and coordination of care  [name] Lose, MD 07/03/2020  I, Cloyde Reams  Dorshimer, am acting as scribe for Dr. [name] Lose.  I have reviewed the above documentation for accuracy and completeness, and I agree with the above.

## 2020-07-03 ENCOUNTER — Other Ambulatory Visit: Payer: Self-pay

## 2020-07-03 ENCOUNTER — Inpatient Hospital Stay (HOSPITAL_BASED_OUTPATIENT_CLINIC_OR_DEPARTMENT_OTHER): Payer: Medicare Other | Admitting: Hematology and Oncology

## 2020-07-03 ENCOUNTER — Telehealth: Payer: Self-pay | Admitting: *Deleted

## 2020-07-03 ENCOUNTER — Inpatient Hospital Stay: Payer: Medicare Other

## 2020-07-03 DIAGNOSIS — Z5111 Encounter for antineoplastic chemotherapy: Secondary | ICD-10-CM | POA: Diagnosis not present

## 2020-07-03 DIAGNOSIS — C50412 Malignant neoplasm of upper-outer quadrant of left female breast: Secondary | ICD-10-CM

## 2020-07-03 DIAGNOSIS — R197 Diarrhea, unspecified: Secondary | ICD-10-CM | POA: Diagnosis not present

## 2020-07-03 DIAGNOSIS — Z170 Estrogen receptor positive status [ER+]: Secondary | ICD-10-CM

## 2020-07-03 DIAGNOSIS — C7951 Secondary malignant neoplasm of bone: Secondary | ICD-10-CM | POA: Diagnosis not present

## 2020-07-03 DIAGNOSIS — C787 Secondary malignant neoplasm of liver and intrahepatic bile duct: Secondary | ICD-10-CM | POA: Diagnosis not present

## 2020-07-03 DIAGNOSIS — C7800 Secondary malignant neoplasm of unspecified lung: Secondary | ICD-10-CM

## 2020-07-03 MED ORDER — FULVESTRANT 250 MG/5ML IM SOLN
INTRAMUSCULAR | Status: AC
  Filled 2020-07-03: qty 10

## 2020-07-03 MED ORDER — FULVESTRANT 250 MG/5ML IM SOLN
500.0000 mg | Freq: Once | INTRAMUSCULAR | Status: AC
  Administered 2020-07-03: 500 mg via INTRAMUSCULAR

## 2020-07-03 NOTE — Assessment & Plan Note (Signed)
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 She had radiation therapy and has been on antiestrogen therapy with Arimidex 06/28/2014-January 2019  Thoracentesis: Met Breast cancer ER PR Positive and Her 2 Neg  Ibrance with Faslodex 12/29/2017-02/27/2019 [facility] molecular testing: PIK3CA mutation present, androgen receptor positive, negative for BRCA1, 2 and PDL 1.  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 Slight increase in T11 vertebral body metastases. Other lesions are stable. Decrease in the portacaval adenopathy. Stable left pleural effusion. ------------------------------------------------------------------------------------------------------------------------------------------ Current Treatment: Alpelisib with Faslodex; Xgeva every 12 weeks Toxicities: 1.Diarrhea: Intermittent 2.Weight loss: Patient gained a few pounds 3.Neck pain: Probably related to metastatic breast cancer. 4.Renal insufficiency:Creatinine continues to fluctuate  Monitoring her blood sugars.:140 (non fasting)  CT CAP 06/30/2020:cecal bascule with partial twist without frank volvulus of the ileocecal mesentery hepatic metastases slight decrease, bone metastases stable, unchanged left pleural effusion  Return to clinic monthly for injections labs  and follow-upin 3 months

## 2020-07-03 NOTE — Patient Instructions (Signed)
Fulvestrant injection What is this medicine? FULVESTRANT (ful VES trant) blocks the effects of estrogen. It is used to treat breast cancer. This medicine may be used for other purposes; ask your health care provider or pharmacist if you have questions. COMMON BRAND NAME(S): FASLODEX What should I tell my health care provider before I take this medicine? They need to know if you have any of these conditions:  bleeding disorders  liver disease  low blood counts, like low white cell, platelet, or red cell counts  an unusual or allergic reaction to fulvestrant, other medicines, foods, dyes, or preservatives  pregnant or trying to get pregnant  breast-feeding How should I use this medicine? This medicine is for injection into a muscle. It is usually given by a health care professional in a hospital or clinic setting. Talk to your pediatrician regarding the use of this medicine in children. Special care may be needed. Overdosage: If you think you have taken too much of this medicine contact a poison control center or emergency room at once. NOTE: This medicine is only for you. Do not share this medicine with others. What if I miss a dose? It is important not to miss your dose. Call your doctor or health care professional if you are unable to keep an appointment. What may interact with this medicine?  medicines that treat or prevent blood clots like warfarin, enoxaparin, dalteparin, apixaban, dabigatran, and rivaroxaban This list may not describe all possible interactions. Give your health care provider a list of all the medicines, herbs, non-prescription drugs, or dietary supplements you use. Also tell them if you smoke, drink alcohol, or use illegal drugs. Some items may interact with your medicine. What should I watch for while using this medicine? Your condition will be monitored carefully while you are receiving this medicine. You will need important blood work done while you are taking  this medicine. Do not become pregnant while taking this medicine or for at least 1 year after stopping it. Women of child-bearing potential will need to have a negative pregnancy test before starting this medicine. Women should inform their doctor if they wish to become pregnant or think they might be pregnant. There is a potential for serious side effects to an unborn child. Men should inform their doctors if they wish to father a child. This medicine may lower sperm counts. Talk to your health care professional or pharmacist for more information. Do not breast-feed an infant while taking this medicine or for 1 year after the last dose. What side effects may I notice from receiving this medicine? Side effects that you should report to your doctor or health care professional as soon as possible:  allergic reactions like skin rash, itching or hives, swelling of the face, lips, or tongue  feeling faint or lightheaded, falls  pain, tingling, numbness, or weakness in the legs  signs and symptoms of infection like fever or chills; cough; flu-like symptoms; sore throat  vaginal bleeding Side effects that usually do not require medical attention (report to your doctor or health care professional if they continue or are bothersome):  aches, pains  constipation  diarrhea  headache  hot flashes  nausea, vomiting  pain at site where injected  stomach pain This list may not describe all possible side effects. Call your doctor for medical advice about side effects. You may report side effects to FDA at 1-800-FDA-1088. Where should I keep my medicine? This drug is given in a hospital or clinic and will   not be stored at home. NOTE: This sheet is a summary. It may not cover all possible information. If you have questions about this medicine, talk to your doctor, pharmacist, or health care provider.  2020 Elsevier/Gold Standard (2018-02-19 11:34:41)  

## 2020-07-03 NOTE — Telephone Encounter (Signed)
CT results and office note faxed to Dr [name]

## 2020-07-04 ENCOUNTER — Telehealth: Payer: Self-pay | Admitting: Hematology and Oncology

## 2020-07-04 NOTE — Telephone Encounter (Signed)
No 8/9 los, no changes made to pt schedule   

## 2020-07-24 MED FILL — POTASSIUM CHLORIDE CRYS ER: 20 | 30 days supply | Qty: 30 | Fill #3

## 2020-07-28 ENCOUNTER — Ambulatory Visit (HOSPITAL_COMMUNITY)
Admission: RE | Admit: 2020-07-28 | Discharge: 2020-07-28 | Disposition: A | Discharge status: Home / Self Care | Payer: Medicare Other | Source: Ambulatory Visit | Attending: Hematology and Oncology | Admitting: Hematology and Oncology

## 2020-07-28 ENCOUNTER — Encounter (HOSPITAL_COMMUNITY)
Admission: RE | Admit: 2020-07-28 | Discharge: 2020-07-28 | Disposition: A | Discharge status: Home / Self Care | Payer: Medicare Other | Source: Ambulatory Visit | Attending: Hematology and Oncology | Admitting: Hematology and Oncology

## 2020-07-28 ENCOUNTER — Other Ambulatory Visit: Payer: Self-pay

## 2020-07-28 DIAGNOSIS — Z170 Estrogen receptor positive status [ER+]: Secondary | ICD-10-CM | POA: Insufficient documentation

## 2020-07-28 DIAGNOSIS — C50919 Malignant neoplasm of unspecified site of unspecified female breast: Secondary | ICD-10-CM

## 2020-07-28 DIAGNOSIS — C50412 Malignant neoplasm of upper-outer quadrant of left female breast: Secondary | ICD-10-CM

## 2020-07-28 DIAGNOSIS — C7951 Secondary malignant neoplasm of bone: Secondary | ICD-10-CM | POA: Diagnosis not present

## 2020-07-28 DIAGNOSIS — C787 Secondary malignant neoplasm of liver and intrahepatic bile duct: Secondary | ICD-10-CM | POA: Diagnosis not present

## 2020-07-28 MED ORDER — IOHEXOL 9 MG/ML PO SOLN
ORAL | Status: AC
  Filled 2020-07-28: qty 1000

## 2020-07-28 MED ORDER — IOHEXOL 9 MG/ML PO SOLN
1000.0000 mL | ORAL | Status: AC
  Administered 2020-07-28: 1000 mL via ORAL

## 2020-07-28 MED ORDER — TECHNETIUM TC 99M MEDRONATE IV KIT
21.0000 | PACK | Freq: Once | INTRAVENOUS | Status: AC
  Administered 2020-07-28: 21 via INTRAVENOUS

## 2020-07-28 MED ORDER — IOHEXOL 300 MG/ML  SOLN
100.0000 mL | Freq: Once | INTRAMUSCULAR | Status: AC | PRN
  Administered 2020-07-28: 75 mL via INTRAVENOUS

## 2020-07-31 NOTE — Progress Notes (Signed)
Patient Care Team: Delorse Limber as PCP - General (Family Medicine)  DIAGNOSIS:    ICD-10-CM   1. Malignant neoplasm of upper-outer quadrant of left breast in female, estrogen receptor positive ([place])  C50.412    Z17.0     SUMMARY OF ONCOLOGIC HISTORY: Oncology History  Malignant neoplasm of upper-outer quadrant of left breast in female, estrogen receptor positive ([place])  07/28/2013 Mammogram   Spiculated mass at 6:00 position 3 cm, ultrasound revealed 2.6 x 2.1 x 1.5 cm second mass at 12:00 2.1 x 1.8 x 2.1 cm: 2 abnormal lymph nodes left axilla   10/29/2013 Initial Diagnosis   Breast cancer of upper-outer quadrant of left female breast: 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 [name], T3N2 (5/10 LN positive) stage IIIa IDC grade 1, ER/PR 100%, TSH 14% HER-2 negative PET/CT 12/20/2013 negative for metastases   12/17/2013 - 04/02/2014 Chemotherapy   Taxotere Cytoxan every 3 weeks for 6 cycles   05/11/2014 - 06/27/2014 Radiation Therapy   Adjuvant radiation therapy to chest wall and lymph nodes   07/06/2014 -  Anti-estrogen oral therapy   Arimidex 1 mg daily   12/21/2017 Imaging   Metastatic lesions noted in the lungs bilaterally, bilateral hilar and mediastinal lymph nodes, left pleural effusion, multiple thoracic vertebral metastases, heterogeneous lesions in the liver   12/22/2017 Procedure   Pleural Effusion Thoracentesis: Metastatic Breast cancer ER PR Positive Her 2 Neg   12/29/2017 Treatment Plan Change   Ibrance, Faslodex and Xgeva   03/09/2019 Treatment Plan Change   Everolimus and Exemestane   06/25/2019 Pathology Results   Liver biopsy: 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, ER 100%, PR 0%, Ki-67 10%, HER-2 -1+ by [facility]   12/13/2019 Miscellaneous   Piqray with Falsodex and Xgeva     CHIEF COMPLIANT: Follow-up of metastatic breast cancer, review scans    INTERVAL HISTORY: Wendy [name] is a 79 y.o. with above-mentioned history of metastatic breast cancer currently on treatment withalpelisib,Faslodexand Xgeva.CT CAP on 07/28/20 showed stable hepatic and osseous metastases. Bone scan on 07/28/20 showed improvement in the calvarial and spina; metastases, with progression in the inferolateral right ribs. She presents to the clinic todayfor a toxicity check and to review her scans.   ALLERGIES:  has No Known Allergies.  MEDICATIONS:  Current Outpatient Medications  Medication Sig Dispense Refill  . acetaminophen (TYLENOL) 500 MG tablet Take 1,000 mg by mouth every 6 (six) hours as needed for mild pain.    [name] alpelisib (PIQRAY 300MG DAILY DOSE) 2 x 150 MG Therapy Pack Take two 1103m tablets with food at the same time daily. 56 each 3  . atorvastatin (LIPITOR) 20 MG tablet TAKE 1 TABLET BY MOUTH EVERY NIGHT AT BEDTIME 90 tablet 3  . Calcium Carb-Cholecalciferol (CALCIUM 600 + D PO) Take 1 tablet by mouth daily.    . ferrous sulfate (SLOW IRON) 160 (50 Fe) MG TBCR SR tablet Take 1 tablet (160 mg total) by mouth daily. 30 tablet 0  . hydrochlorothiazide (MICROZIDE) 12.5 MG capsule TAKE 1 CAPSULE(12.5 MG) BY MOUTH DAILY 90 capsule 2  . loratadine-pseudoephedrine (CLARITIN-D 24 HOUR) 10-240 MG 24 hr tablet Take 1 tablet by mouth daily.    . Multiple Vitamin (MULTIVITAMIN WITH MINERALS) TABS tablet Take 1 tablet by mouth daily.    . nitroGLYCERIN (NITROSTAT) 0.4 MG SL tablet PLACE 1 TABLET (0.4 MG TOTAL) UNDER THE TONGUE EVERY  5 (FIVE) MINUTES AS NEEDED FOR CHEST PAIN. 25 tablet 0  . ondansetron (ZOFRAN ODT) 8 MG disintegrating tablet Take 1 tablet (8 mg total) by mouth every 8 (eight) hours as needed for nausea or vomiting. 30 tablet 3  . potassium chloride SA (KLOR-CON) 20 MEQ tablet TAKE 1 TABLET BY MOUTH ONCE DAILY 30 tablet 3   No current facility-administered medications for this visit.    PHYSICAL EXAMINATION: ECOG PERFORMANCE STATUS: 1 -  Symptomatic but completely ambulatory  Vitals:   08/01/20 1122  BP: (!) 142/67  Pulse: 64  Resp: 18  Temp: (!) 96.5 F (35.8 C)   Filed Weights   08/01/20 1122  Weight: 142 lb (64.4 kg)    LABORATORY DATA:  I have reviewed the data as listed CMP Latest Ref Rng & Units 08/01/2020 06/30/2020 05/30/2020  Glucose 70 - 99 mg/dL 129(H) 86 141(H)  BUN 8 - 23 mg/dL _0 Creatinine 0.44 - 1.00 mg/dL 1.37(H) 1.47(H) 1.54(H)  Sodium 135 - 145 mmol/L 140 141 141  Potassium 3.5 - 5.1 mmol/L 3.7 3.8 4.3  Chloride 98 - 111 mmol/L 99 97(L) 100  CO2 22 - 32 mmol/L 33(H) 33(H) 32  Calcium 8.9 - 10.3 mg/dL 9.9 10.4(H) 10.2  Total Protein 6.5 - 8.1 g/dL 7.5 7.8 7.3  Total Bilirubin 0.3 - 1.2 mg/dL 0.7 0.7 0.6  Alkaline Phos 38 - 126 U/L 237(H) 203(H) 91  AST 15 - 41 U/L 56(H) 57(H) 48(H)  ALT 0 - 44 U/L 36 40 30    Lab Results  Component Value Date   WBC 6.3 08/01/2020   HGB 13.8 08/01/2020   HCT 41.6 08/01/2020   MCV 93.5 08/01/2020   PLT 226 08/01/2020   NEUTROABS 3.2 08/01/2020    ASSESSMENT & PLAN:  Malignant neoplasm of upper-outer quadrant of left breast in female, 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 She had radiation therapy and has been on antiestrogen therapy with Arimidex 06/28/2014-January 2019  Thoracentesis: Met Breast cancer ER PR Positive and Her 2 Neg  Ibrance with Faslodex 12/29/2017-02/27/2019 [facility] molecular testing: PIK3CA mutation present, androgen receptor positive, negative for BRCA1, 2 and PDL 1.  Exemestane with everolimus started 03/09/2019 06/25/2019:Liver biopsy: 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, ER 100%, PR 0%, Ki-67 10%, HER-2 -1+ by IHC I discussed the [facility] molecular testing results with the patient which showed PI K3 CA  mutation as well as ER positivity.  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 Slight increase in T11 vertebral body metastases. Other lesions are stable. Decrease in the portacaval adenopathy. Stable left pleural effusion. ------------------------------------------------------------------------------------------------------------------------------------------ Current Treatment: Alpelisib with Faslodex; Xgeva every 12 weeks Toxicities: 1.Diarrhea: Intermittent 2.Weight loss:Patient gained a few pounds 3.Neck pain: Probably related to metastatic breast cancer. 4.Renal insufficiency:Creatininecontinues to fluctuate  Monitoring her blood sugars.:86  CT CAP 07/28/2020: Hepatic and bone metastases (2.6 x 4.7 cm, 5.7 x 6.5 cm) stable.  Stable posttreatment changes in the left hemithorax. Bone scan 07/28/2020: Mixed response to therapy with interval improvement in the calvarial and spinal metastasis with exception of L2 vertebral body and progressive disease involving right ribs  Based on relatively stable findings,  we will continue on with the current treatment.  Return to clinic monthly for injections labs and follow-up in 1 month.    No orders of the defined types were placed in this encounter.  The patient has a good understanding of the overall plan. she agrees with it. she will call with any problems that may develop before the next visit here.  Total time spent: 30 mins including face to face time and time spent for planning, charting and coordination of care  [name] Lose, MD 08/01/2020  I, Cloyde Reams Dorshimer, am acting as scribe for Dr. [name] Lose.  I have reviewed the above documentation for accuracy and completeness, and I agree with the above.

## 2020-08-01 ENCOUNTER — Inpatient Hospital Stay (HOSPITAL_BASED_OUTPATIENT_CLINIC_OR_DEPARTMENT_OTHER): Payer: Medicare Other | Admitting: Hematology and Oncology

## 2020-08-01 ENCOUNTER — Inpatient Hospital Stay: Payer: Medicare Other | Attending: Hematology and Oncology

## 2020-08-01 ENCOUNTER — Telehealth: Payer: Self-pay | Admitting: Hematology and Oncology

## 2020-08-01 ENCOUNTER — Other Ambulatory Visit: Payer: Self-pay

## 2020-08-01 ENCOUNTER — Inpatient Hospital Stay: Payer: Medicare Other

## 2020-08-01 DIAGNOSIS — M542 Cervicalgia: Secondary | ICD-10-CM | POA: Diagnosis not present

## 2020-08-01 DIAGNOSIS — C50412 Malignant neoplasm of upper-outer quadrant of left female breast: Secondary | ICD-10-CM

## 2020-08-01 DIAGNOSIS — C7800 Secondary malignant neoplasm of unspecified lung: Secondary | ICD-10-CM

## 2020-08-01 DIAGNOSIS — Z170 Estrogen receptor positive status [ER+]: Secondary | ICD-10-CM | POA: Diagnosis not present

## 2020-08-01 DIAGNOSIS — C7951 Secondary malignant neoplasm of bone: Secondary | ICD-10-CM | POA: Insufficient documentation

## 2020-08-01 DIAGNOSIS — Z5111 Encounter for antineoplastic chemotherapy: Secondary | ICD-10-CM | POA: Diagnosis not present

## 2020-08-01 DIAGNOSIS — R634 Abnormal weight loss: Secondary | ICD-10-CM | POA: Diagnosis not present

## 2020-08-01 DIAGNOSIS — C787 Secondary malignant neoplasm of liver and intrahepatic bile duct: Secondary | ICD-10-CM | POA: Insufficient documentation

## 2020-08-01 DIAGNOSIS — N289 Disorder of kidney and ureter, unspecified: Secondary | ICD-10-CM | POA: Diagnosis not present

## 2020-08-01 DIAGNOSIS — Z9012 Acquired absence of left breast and nipple: Secondary | ICD-10-CM | POA: Insufficient documentation

## 2020-08-01 DIAGNOSIS — R197 Diarrhea, unspecified: Secondary | ICD-10-CM | POA: Insufficient documentation

## 2020-08-01 DIAGNOSIS — Z923 Personal history of irradiation: Secondary | ICD-10-CM | POA: Diagnosis not present

## 2020-08-01 LAB — CBC WITH DIFFERENTIAL (CANCER CENTER ONLY)
Abs Immature Granulocytes: 0.02 10*3/uL (ref 0.00–0.07)
Basophils Absolute: 0 10*3/uL (ref 0.0–0.1)
Basophils Relative: 1 %
Eosinophils Absolute: 0.3 10*3/uL (ref 0.0–0.5)
Eosinophils Relative: 5 %
HCT: 41.6 % (ref 36.0–46.0)
Hemoglobin: 13.8 g/dL (ref 12.0–15.0)
Immature Granulocytes: 0 %
Lymphocytes Relative: 30 %
Lymphs Abs: 1.9 10*3/uL (ref 0.7–4.0)
MCH: 31 pg (ref 26.0–34.0)
MCHC: 33.2 g/dL (ref 30.0–36.0)
MCV: 93.5 fL (ref 80.0–100.0)
Monocytes Absolute: 0.8 10*3/uL (ref 0.1–1.0)
Monocytes Relative: 13 %
Neutro Abs: 3.2 10*3/uL (ref 1.7–7.7)
Neutrophils Relative %: 51 %
Platelet Count: 226 10*3/uL (ref 150–400)
RBC: 4.45 MIL/uL (ref 3.87–5.11)
RDW: 16.7 % — ABNORMAL HIGH (ref 11.5–15.5)
WBC Count: 6.3 10*3/uL (ref 4.0–10.5)
nRBC: 0 % (ref 0.0–0.2)

## 2020-08-01 LAB — CMP (CANCER CENTER ONLY)
ALT: 36 U/L (ref 0–44)
AST: 56 U/L — ABNORMAL HIGH (ref 15–41)
Albumin: 3.9 g/dL (ref 3.5–5.0)
Alkaline Phosphatase: 237 U/L — ABNORMAL HIGH (ref 38–126)
Anion gap: 8 (ref 5–15)
BUN: 17 mg/dL (ref 8–23)
CO2: 33 mmol/L — ABNORMAL HIGH (ref 22–32)
Calcium: 9.9 mg/dL (ref 8.9–10.3)
Chloride: 99 mmol/L (ref 98–111)
Creatinine: 1.37 mg/dL — ABNORMAL HIGH (ref 0.44–1.00)
GFR, Est AFR Am: 42 mL/min — ABNORMAL LOW (ref >60)
GFR, Estimated: 37 mL/min — ABNORMAL LOW (ref >60)
Glucose, Bld: 129 mg/dL — ABNORMAL HIGH (ref 70–99)
Potassium: 3.7 mmol/L (ref 3.5–5.1)
Sodium: 140 mmol/L (ref 135–145)
Total Bilirubin: 0.7 mg/dL (ref 0.3–1.2)
Total Protein: 7.5 g/dL (ref 6.5–8.1)

## 2020-08-01 MED ORDER — FULVESTRANT 250 MG/5ML IM SOLN
INTRAMUSCULAR | Status: AC
  Filled 2020-08-01: qty 5

## 2020-08-01 MED ORDER — FULVESTRANT 250 MG/5ML IM SOLN
500.0000 mg | Freq: Once | INTRAMUSCULAR | Status: AC
  Administered 2020-08-01: 500 mg via INTRAMUSCULAR

## 2020-08-01 NOTE — Assessment & Plan Note (Addendum)
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 She had radiation therapy and has been on antiestrogen therapy with Arimidex 06/28/2014-January 2019  Thoracentesis: Met Breast cancer ER PR Positive and Her 2 Neg  Ibrance with Faslodex 12/29/2017-02/27/2019 [facility] molecular testing: PIK3CA mutation present, androgen receptor positive, negative for BRCA1, 2 and PDL 1.  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 Slight increase in T11 vertebral body metastases. Other lesions are stable. Decrease in the portacaval adenopathy. Stable left pleural effusion. ------------------------------------------------------------------------------------------------------------------------------------------ Current Treatment: Alpelisib with Faslodex; Xgeva every 12 weeks Toxicities: 1.Diarrhea: Intermittent 2.Weight loss:Patient gained a few pounds 3.Neck pain: Probably related to metastatic breast cancer. 4.Renal insufficiency:Creatininecontinues to fluctuate  Monitoring her blood sugars.:86  CT CAP 07/28/2020: Hepatic and bone metastases (2.6 x 4.7 cm, 5.7 x 6.5 cm) stable.  Stable posttreatment changes in the left hemithorax. Bone scan 07/28/2020: Mixed response to therapy with interval improvement in the calvarial and spinal metastasis with  exception of L2 vertebral body and progressive disease involving right ribs  Based on relatively stable findings, we will continue on with the current treatment.  Return to clinic monthly for injections labs and follow-up in 1 month.

## 2020-08-01 NOTE — Patient Instructions (Signed)
Fulvestrant injection What is this medicine? FULVESTRANT (ful VES trant) blocks the effects of estrogen. It is used to treat breast cancer. This medicine may be used for other purposes; ask your health care provider or pharmacist if you have questions. COMMON BRAND NAME(S): FASLODEX What should I tell my health care provider before I take this medicine? They need to know if you have any of these conditions:  bleeding disorders  liver disease  low blood counts, like low white cell, platelet, or red cell counts  an unusual or allergic reaction to fulvestrant, other medicines, foods, dyes, or preservatives  pregnant or trying to get pregnant  breast-feeding How should I use this medicine? This medicine is for injection into a muscle. It is usually given by a health care professional in a hospital or clinic setting. Talk to your pediatrician regarding the use of this medicine in children. Special care may be needed. Overdosage: If you think you have taken too much of this medicine contact a poison control center or emergency room at once. NOTE: This medicine is only for you. Do not share this medicine with others. What if I miss a dose? It is important not to miss your dose. Call your doctor or health care professional if you are unable to keep an appointment. What may interact with this medicine?  medicines that treat or prevent blood clots like warfarin, enoxaparin, dalteparin, apixaban, dabigatran, and rivaroxaban This list may not describe all possible interactions. Give your health care provider a list of all the medicines, herbs, non-prescription drugs, or dietary supplements you use. Also tell them if you smoke, drink alcohol, or use illegal drugs. Some items may interact with your medicine. What should I watch for while using this medicine? Your condition will be monitored carefully while you are receiving this medicine. You will need important blood work done while you are taking  this medicine. Do not become pregnant while taking this medicine or for at least 1 year after stopping it. Women of child-bearing potential will need to have a negative pregnancy test before starting this medicine. Women should inform their doctor if they wish to become pregnant or think they might be pregnant. There is a potential for serious side effects to an unborn child. Men should inform their doctors if they wish to father a child. This medicine may lower sperm counts. Talk to your health care professional or pharmacist for more information. Do not breast-feed an infant while taking this medicine or for 1 year after the last dose. What side effects may I notice from receiving this medicine? Side effects that you should report to your doctor or health care professional as soon as possible:  allergic reactions like skin rash, itching or hives, swelling of the face, lips, or tongue  feeling faint or lightheaded, falls  pain, tingling, numbness, or weakness in the legs  signs and symptoms of infection like fever or chills; cough; flu-like symptoms; sore throat  vaginal bleeding Side effects that usually do not require medical attention (report to your doctor or health care professional if they continue or are bothersome):  aches, pains  constipation  diarrhea  headache  hot flashes  nausea, vomiting  pain at site where injected  stomach pain This list may not describe all possible side effects. Call your doctor for medical advice about side effects. You may report side effects to FDA at 1-800-FDA-1088. Where should I keep my medicine? This drug is given in a hospital or clinic and will   not be stored at home. NOTE: This sheet is a summary. It may not cover all possible information. If you have questions about this medicine, talk to your doctor, pharmacist, or health care provider.  2020 Elsevier/Gold Standard (2018-02-19 11:34:41)  

## 2020-08-01 NOTE — Telephone Encounter (Signed)
Scheduled appts per 9/7 los. Pt confirmed appt date and time.

## 2020-08-08 ENCOUNTER — Other Ambulatory Visit: Payer: Self-pay | Admitting: *Deleted

## 2020-08-08 DIAGNOSIS — C50919 Malignant neoplasm of unspecified site of unspecified female breast: Secondary | ICD-10-CM

## 2020-08-08 MED ORDER — ALPELISIB (300 MG DAILY DOSE) 2 X 150 MG PO TBPK: ORAL_TABLET | ORAL | 3 refills | Status: DC

## 2020-08-17 DIAGNOSIS — H2512 Age-related nuclear cataract, left eye: Secondary | ICD-10-CM | POA: Diagnosis not present

## 2020-08-17 DIAGNOSIS — Z961 Presence of intraocular lens: Secondary | ICD-10-CM | POA: Diagnosis not present

## 2020-08-17 DIAGNOSIS — H43812 Vitreous degeneration, left eye: Secondary | ICD-10-CM | POA: Diagnosis not present

## 2020-08-18 DIAGNOSIS — C787 Secondary malignant neoplasm of liver and intrahepatic bile duct: Secondary | ICD-10-CM | POA: Diagnosis not present

## 2020-08-18 DIAGNOSIS — C7951 Secondary malignant neoplasm of bone: Secondary | ICD-10-CM | POA: Diagnosis not present

## 2020-08-18 DIAGNOSIS — C7800 Secondary malignant neoplasm of unspecified lung: Secondary | ICD-10-CM | POA: Diagnosis not present

## 2020-08-18 DIAGNOSIS — Z515 Encounter for palliative care: Secondary | ICD-10-CM | POA: Diagnosis not present

## 2020-08-18 DIAGNOSIS — C50412 Malignant neoplasm of upper-outer quadrant of left female breast: Secondary | ICD-10-CM | POA: Diagnosis not present

## 2020-08-21 ENCOUNTER — Other Ambulatory Visit: Payer: Self-pay | Admitting: Hematology and Oncology

## 2020-08-21 MED FILL — POTASSIUM CHLORIDE CRYS ER: 20 | 30 days supply | Qty: 30 | Fill #0

## 2020-08-28 NOTE — Progress Notes (Signed)
Patient Care Team: Delorse Limber as PCP - General (Family Medicine)  DIAGNOSIS:    ICD-10-CM   1. Malignant neoplasm metastatic to lung, unspecified laterality ([place])  C78.00   2. Malignant neoplasm of upper-outer quadrant of left breast in female, estrogen receptor positive ([place])  C50.412    Z17.0     SUMMARY OF ONCOLOGIC HISTORY: Oncology History  Malignant neoplasm of upper-outer quadrant of left breast in female, estrogen receptor positive ([place])  07/28/2013 Mammogram   Spiculated mass at 6:00 position 3 cm, ultrasound revealed 2.6 x 2.1 x 1.5 cm second mass at 12:00 2.1 x 1.8 x 2.1 cm: 2 abnormal lymph nodes left axilla   10/29/2013 Initial Diagnosis   Breast cancer of upper-outer quadrant of left female breast: 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 [name], T3N2 (5/10 LN positive) stage IIIa IDC grade 1, ER/PR 100%, TSH 14% HER-2 negative PET/CT 12/20/2013 negative for metastases   12/17/2013 - 04/02/2014 Chemotherapy   Taxotere Cytoxan every 3 weeks for 6 cycles   05/11/2014 - 06/27/2014 Radiation Therapy   Adjuvant radiation therapy to chest wall and lymph nodes   07/06/2014 -  Anti-estrogen oral therapy   Arimidex 1 mg daily   12/21/2017 Imaging   Metastatic lesions noted in the lungs bilaterally, bilateral hilar and mediastinal lymph nodes, left pleural effusion, multiple thoracic vertebral metastases, heterogeneous lesions in the liver   12/22/2017 Procedure   Pleural Effusion Thoracentesis: Metastatic Breast cancer ER PR Positive Her 2 Neg   12/29/2017 Treatment Plan Change   Ibrance, Faslodex and Xgeva   03/09/2019 Treatment Plan Change   Everolimus and Exemestane   06/25/2019 Pathology Results   Liver biopsy: 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, ER 100%, PR 0%, Ki-67 10%, HER-2 -1+ by [facility]   12/13/2019 Miscellaneous   Piqray with Lorri Frederick and Delton See       CHIEF COMPLIANT: Follow-up of metastatic breast cancer  INTERVAL HISTORY: Wendy [name] is a 79 y.o. with above-mentioned history of metastatic breast cancer currently on treatment withalpelisib,Faslodexand Xgeva. She presents to the clinic todayfor a toxicity check.  ALLERGIES:  has No Known Allergies.  MEDICATIONS:  Current Outpatient Medications  Medication Sig Dispense Refill  . acetaminophen (TYLENOL) 500 MG tablet Take 1,000 mg by mouth every 6 (six) hours as needed for mild pain.    [name] alpelisib (PIQRAY 300MG DAILY DOSE) 2 x 150 MG Therapy Pack Take two 166m tablets with food at the same time daily. 56 each 3  . atorvastatin (LIPITOR) 20 MG tablet TAKE 1 TABLET BY MOUTH EVERY NIGHT AT BEDTIME 90 tablet 3  . Calcium Carb-Cholecalciferol (CALCIUM 600 + D PO) Take 1 tablet by mouth daily.    . ferrous sulfate (SLOW IRON) 160 (50 Fe) MG TBCR SR tablet Take 1 tablet (160 mg total) by mouth daily. 30 tablet 0  . hydrochlorothiazide (MICROZIDE) 12.5 MG capsule TAKE 1 CAPSULE(12.5 MG) BY MOUTH DAILY 90 capsule 2  . loratadine-pseudoephedrine (CLARITIN-D 24 HOUR) 10-240 MG 24 hr tablet Take 1 tablet by mouth daily.    . Multiple Vitamin (MULTIVITAMIN WITH MINERALS) TABS tablet Take 1 tablet by mouth daily.    . nitroGLYCERIN (NITROSTAT) 0.4 MG SL tablet PLACE 1 TABLET (0.4 MG TOTAL) UNDER THE TONGUE EVERY 5 (FIVE) MINUTES AS NEEDED FOR CHEST PAIN. 25 tablet 0  . ondansetron (ZOFRAN ODT) 8 MG disintegrating tablet Take 1 tablet (  8 mg total) by mouth every 8 (eight) hours as needed for nausea or vomiting. 30 tablet 3  . potassium chloride SA (KLOR-CON) 20 MEQ tablet TAKE 1 TABLET BY MOUTH ONCE DAILY 30 tablet 3   No current facility-administered medications for this visit.    PHYSICAL EXAMINATION: ECOG PERFORMANCE STATUS: 1 - Symptomatic but completely ambulatory  Vitals:   08/29/20 1133  BP: (!) 114/99  Pulse: 66  Resp: 18  Temp: (!) 97.1 F (36.2 C)  SpO2: 97%   Filed  Weights   08/29/20 1133  Weight: 141 lb 1.6 oz (64 kg)    LABORATORY DATA:  I have reviewed the data as listed CMP Latest Ref Rng & Units 08/01/2020 06/30/2020 05/30/2020  Glucose 70 - 99 mg/dL 129(H) 86 141(H)  BUN 8 - 23 mg/dL 17 18 18   Creatinine 0.44 - 1.00 mg/dL 1.37(H) 1.47(H) 1.54(H)  Sodium 135 - 145 mmol/L 140 141 141  Potassium 3.5 - 5.1 mmol/L 3.7 3.8 4.3  Chloride 98 - 111 mmol/L 99 97(L) 100  CO2 22 - 32 mmol/L 33(H) 33(H) 32  Calcium 8.9 - 10.3 mg/dL 9.9 10.4(H) 10.2  Total Protein 6.5 - 8.1 g/dL 7.5 7.8 7.3  Total Bilirubin 0.3 - 1.2 mg/dL 0.7 0.7 0.6  Alkaline Phos 38 - 126 U/L 237(H) 203(H) 91  AST 15 - 41 U/L 56(H) 57(H) 48(H)  ALT 0 - 44 U/L 36 40 30    Lab Results  Component Value Date   WBC 5.8 08/29/2020   HGB 13.8 08/29/2020   HCT 41.5 08/29/2020   MCV 92.8 08/29/2020   PLT 237 08/29/2020   NEUTROABS 2.9 08/29/2020    ASSESSMENT & PLAN:  Malignant neoplasm of upper-outer quadrant of left breast in female, 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 She had radiation therapy and has been on antiestrogen therapy with Arimidex 06/28/2014-January 2019  Thoracentesis: Met Breast cancer ER PR Positive and Her 2 Neg  Ibrance with Faslodex 12/29/2017-02/27/2019 [facility] molecular testing: PIK3CA mutation present, androgen receptor positive, negative for BRCA1, 2 and PDL 1.  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 4.Renal insufficiency:Creatininecontinues to fluctuate 5.  Severe fatigue: Limiting her activities.  I recommended reduction in dosage of Piqray to 200 mg daily.  I sent a new prescription to her pharmacy.  Until the new prescription arrives she will take only 1 tablet of the 150 mg daily.  Monitoring her blood sugars.:  129  CT CAP 07/28/2020: Hepatic and bone metastases (2.6 x 4.7 cm, 5.7 x 6.5 cm) stable.  Stable posttreatment changes in the left hemithorax. Bone scan 07/28/2020: Mixed response to therapy with interval improvement in the calvarial and spinal metastasis with exception of L2 vertebral body and progressive disease involving right ribs   Return to clinic monthly for injections labsand  follow-up in 1 month. Scans will be done in 2 months.   No orders of the defined types were placed in this encounter.  The patient has a good understanding of the overall plan. she agrees with it. she will call with any problems that may develop before the next visit here.  Total time spent: 30 mins including face to face time and time spent for planning, charting and coordination of care  [name] Lose, MD 08/29/2020  I, Cloyde Reams Dorshimer, am acting as scribe for Dr. [name] Lose.  I have reviewed the above documentation for accuracy and completeness, and I agree with the above.

## 2020-08-29 ENCOUNTER — Inpatient Hospital Stay: Payer: Medicare Other | Attending: Hematology and Oncology | Admitting: Hematology and Oncology

## 2020-08-29 ENCOUNTER — Other Ambulatory Visit: Payer: Self-pay

## 2020-08-29 ENCOUNTER — Inpatient Hospital Stay: Payer: Medicare Other

## 2020-08-29 ENCOUNTER — Ambulatory Visit: Payer: Medicare Other

## 2020-08-29 VITALS — BP 114/99 | HR 66 | Temp 97.1°F | Resp 18 | Ht 68.0 in | Wt 141.1 lb

## 2020-08-29 DIAGNOSIS — R197 Diarrhea, unspecified: Secondary | ICD-10-CM | POA: Diagnosis not present

## 2020-08-29 DIAGNOSIS — Z9012 Acquired absence of left breast and nipple: Secondary | ICD-10-CM | POA: Diagnosis not present

## 2020-08-29 DIAGNOSIS — C50412 Malignant neoplasm of upper-outer quadrant of left female breast: Secondary | ICD-10-CM

## 2020-08-29 DIAGNOSIS — Z170 Estrogen receptor positive status [ER+]: Secondary | ICD-10-CM

## 2020-08-29 DIAGNOSIS — Z5112 Encounter for antineoplastic immunotherapy: Secondary | ICD-10-CM | POA: Insufficient documentation

## 2020-08-29 DIAGNOSIS — C50919 Malignant neoplasm of unspecified site of unspecified female breast: Secondary | ICD-10-CM | POA: Diagnosis not present

## 2020-08-29 DIAGNOSIS — M542 Cervicalgia: Secondary | ICD-10-CM | POA: Diagnosis not present

## 2020-08-29 DIAGNOSIS — C7951 Secondary malignant neoplasm of bone: Secondary | ICD-10-CM | POA: Diagnosis not present

## 2020-08-29 DIAGNOSIS — Z5111 Encounter for antineoplastic chemotherapy: Secondary | ICD-10-CM | POA: Diagnosis not present

## 2020-08-29 DIAGNOSIS — C7800 Secondary malignant neoplasm of unspecified lung: Secondary | ICD-10-CM

## 2020-08-29 DIAGNOSIS — R634 Abnormal weight loss: Secondary | ICD-10-CM | POA: Diagnosis not present

## 2020-08-29 DIAGNOSIS — N289 Disorder of kidney and ureter, unspecified: Secondary | ICD-10-CM | POA: Diagnosis not present

## 2020-08-29 DIAGNOSIS — R5383 Other fatigue: Secondary | ICD-10-CM | POA: Insufficient documentation

## 2020-08-29 DIAGNOSIS — Z923 Personal history of irradiation: Secondary | ICD-10-CM | POA: Insufficient documentation

## 2020-08-29 LAB — CMP (CANCER CENTER ONLY)
ALT: 30 U/L (ref 0–44)
AST: 47 U/L — ABNORMAL HIGH (ref 15–41)
Albumin: 3.9 g/dL (ref 3.5–5.0)
Alkaline Phosphatase: 217 U/L — ABNORMAL HIGH (ref 38–126)
Anion gap: 6 (ref 5–15)
BUN: 17 mg/dL (ref 8–23)
CO2: 33 mmol/L — ABNORMAL HIGH (ref 22–32)
Calcium: 9.8 mg/dL (ref 8.9–10.3)
Chloride: 97 mmol/L — ABNORMAL LOW (ref 98–111)
Creatinine: 1.41 mg/dL — ABNORMAL HIGH (ref 0.44–1.00)
GFR, Estimated: 35 mL/min — ABNORMAL LOW (ref >60)
Glucose, Bld: 209 mg/dL — ABNORMAL HIGH (ref 70–99)
Potassium: 3.5 mmol/L (ref 3.5–5.1)
Sodium: 136 mmol/L (ref 135–145)
Total Bilirubin: 0.8 mg/dL (ref 0.3–1.2)
Total Protein: 7.5 g/dL (ref 6.5–8.1)

## 2020-08-29 LAB — CBC WITH DIFFERENTIAL (CANCER CENTER ONLY)
Abs Immature Granulocytes: 0.03 10*3/uL (ref 0.00–0.07)
Basophils Absolute: 0.1 10*3/uL (ref 0.0–0.1)
Basophils Relative: 1 %
Eosinophils Absolute: 0.4 10*3/uL (ref 0.0–0.5)
Eosinophils Relative: 6 %
HCT: 41.5 % (ref 36.0–46.0)
Hemoglobin: 13.8 g/dL (ref 12.0–15.0)
Immature Granulocytes: 1 %
Lymphocytes Relative: 30 %
Lymphs Abs: 1.7 10*3/uL (ref 0.7–4.0)
MCH: 30.9 pg (ref 26.0–34.0)
MCHC: 33.3 g/dL (ref 30.0–36.0)
MCV: 92.8 fL (ref 80.0–100.0)
Monocytes Absolute: 0.7 10*3/uL (ref 0.1–1.0)
Monocytes Relative: 13 %
Neutro Abs: 2.9 10*3/uL (ref 1.7–7.7)
Neutrophils Relative %: 49 %
Platelet Count: 237 10*3/uL (ref 150–400)
RBC: 4.47 MIL/uL (ref 3.87–5.11)
RDW: 16.6 % — ABNORMAL HIGH (ref 11.5–15.5)
WBC Count: 5.8 10*3/uL (ref 4.0–10.5)
nRBC: 0 % (ref 0.0–0.2)

## 2020-08-29 MED ORDER — FULVESTRANT 250 MG/5ML IM SOLN
500.0000 mg | Freq: Once | INTRAMUSCULAR | Status: AC
  Administered 2020-08-29: 500 mg via INTRAMUSCULAR

## 2020-08-29 MED ORDER — ALPELISIB (200 MG DAILY DOSE) 200 MG PO TBPK: ORAL_TABLET | ORAL | 6 refills | Status: AC

## 2020-08-29 MED ORDER — DENOSUMAB 120 MG/1.7ML ~~LOC~~ SOLN
120.0000 mg | Freq: Once | SUBCUTANEOUS | Status: AC
  Administered 2020-08-29: 120 mg via SUBCUTANEOUS

## 2020-08-29 NOTE — Assessment & Plan Note (Signed)
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 She had radiation therapy and has been on antiestrogen therapy with Arimidex 06/28/2014-January 2019  Thoracentesis: Met Breast cancer ER PR Positive and Her 2 Neg  Ibrance with Faslodex 12/29/2017-02/27/2019 [facility] molecular testing: PIK3CA mutation present, androgen receptor positive, negative for BRCA1, 2 and PDL 1.  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 4.Renal insufficiency:Creatininecontinues to fluctuate  Monitoring her blood sugars.:86  CT CAP 07/28/2020: Hepatic and bone metastases (2.6 x 4.7 cm, 5.7 x 6.5 cm) stable.  Stable posttreatment changes in the left hemithorax. Bone scan 07/28/2020: Mixed response to therapy with interval improvement in the calvarial and spinal metastasis with exception of L2 vertebral body and progressive disease involving right ribs  Based on relatively stable findings, we will continue on with the current treatment.  Return to clinic monthly for injections labsand follow-up in 1  month.

## 2020-08-29 NOTE — Patient Instructions (Signed)
Fulvestrant injection What is this medicine? FULVESTRANT (ful VES trant) blocks the effects of estrogen. It is used to treat breast cancer. This medicine may be used for other purposes; ask your health care provider or pharmacist if you have questions. COMMON BRAND NAME(S): FASLODEX What should I tell my health care provider before I take this medicine? They need to know if you have any of these conditions:  bleeding disorders  liver disease  low blood counts, like low white cell, platelet, or red cell counts  an unusual or allergic reaction to fulvestrant, other medicines, foods, dyes, or preservatives  pregnant or trying to get pregnant  breast-feeding How should I use this medicine? This medicine is for injection into a muscle. It is usually given by a health care professional in a hospital or clinic setting. Talk to your pediatrician regarding the use of this medicine in children. Special care may be needed. Overdosage: If you think you have taken too much of this medicine contact a poison control center or emergency room at once. NOTE: This medicine is only for you. Do not share this medicine with others. What if I miss a dose? It is important not to miss your dose. Call your doctor or health care professional if you are unable to keep an appointment. What may interact with this medicine?  medicines that treat or prevent blood clots like warfarin, enoxaparin, dalteparin, apixaban, dabigatran, and rivaroxaban This list may not describe all possible interactions. Give your health care provider a list of all the medicines, herbs, non-prescription drugs, or dietary supplements you use. Also tell them if you smoke, drink alcohol, or use illegal drugs. Some items may interact with your medicine. What should I watch for while using this medicine? Your condition will be monitored carefully while you are receiving this medicine. You will need important blood work done while you are taking  this medicine. Do not become pregnant while taking this medicine or for at least 1 year after stopping it. Women of child-bearing potential will need to have a negative pregnancy test before starting this medicine. Women should inform their doctor if they wish to become pregnant or think they might be pregnant. There is a potential for serious side effects to an unborn child. Men should inform their doctors if they wish to father a child. This medicine may lower sperm counts. Talk to your health care professional or pharmacist for more information. Do not breast-feed an infant while taking this medicine or for 1 year after the last dose. What side effects may I notice from receiving this medicine? Side effects that you should report to your doctor or health care professional as soon as possible:  allergic reactions like skin rash, itching or hives, swelling of the face, lips, or tongue  feeling faint or lightheaded, falls  pain, tingling, numbness, or weakness in the legs  signs and symptoms of infection like fever or chills; cough; flu-like symptoms; sore throat  vaginal bleeding Side effects that usually do not require medical attention (report to your doctor or health care professional if they continue or are bothersome):  aches, pains  constipation  diarrhea  headache  hot flashes  nausea, vomiting  pain at site where injected  stomach pain This list may not describe all possible side effects. Call your doctor for medical advice about side effects. You may report side effects to FDA at 1-800-FDA-1088. Where should I keep my medicine? This drug is given in a hospital or clinic and will  not be stored at home. NOTE: This sheet is a summary. It may not cover all possible information. If you have questions about this medicine, talk to your doctor, pharmacist, or health care provider.  2020 [employer]/Gold Standard (2018-02-19 11:34:41) Denosumab injection What is this  medicine? DENOSUMAB (den oh sue mab) slows bone breakdown. Prolia is used to treat osteoporosis in women after menopause and in men, and in people who are taking corticosteroids for 6 months or more. Delton See is used to treat a high calcium level due to cancer and to prevent bone fractures and other bone problems caused by multiple myeloma or cancer bone metastases. Delton See is also used to treat giant cell tumor of the bone. This medicine may be used for other purposes; ask your health care provider or pharmacist if you have questions. COMMON BRAND NAME(S): Prolia, XGEVA What should I tell my health care provider before I take this medicine? They need to know if you have any of these conditions:  dental disease  having surgery or tooth extraction  infection  kidney disease  low levels of calcium or Vitamin D in the blood  malnutrition  on hemodialysis  skin conditions or sensitivity  thyroid or parathyroid disease  an unusual reaction to denosumab, other medicines, foods, dyes, or preservatives  pregnant or trying to get pregnant  breast-feeding How should I use this medicine? This medicine is for injection under the skin. It is given by a health care professional in a hospital or clinic setting. A special MedGuide will be given to you before each treatment. Be sure to read this information carefully each time. For Prolia, talk to your pediatrician regarding the use of this medicine in children. Special care may be needed. For Delton See, talk to your pediatrician regarding the use of this medicine in children. While this drug may be prescribed for children as young as 13 years for selected conditions, precautions do apply. Overdosage: If you think you have taken too much of this medicine contact a poison control center or emergency room at once. NOTE: This medicine is only for you. Do not share this medicine with others. What if I miss a dose? It is important not to miss your dose. Call  your doctor or health care professional if you are unable to keep an appointment. What may interact with this medicine? Do not take this medicine with any of the following medications:  other medicines containing denosumab This medicine may also interact with the following medications:  medicines that lower your chance of fighting infection  steroid medicines like prednisone or cortisone This list may not describe all possible interactions. Give your health care provider a list of all the medicines, herbs, non-prescription drugs, or dietary supplements you use. Also tell them if you smoke, drink alcohol, or use illegal drugs. Some items may interact with your medicine. What should I watch for while using this medicine? Visit your doctor or health care professional for regular checks on your progress. Your doctor or health care professional may order blood tests and other tests to see how you are doing. Call your doctor or health care professional for advice if you get a fever, chills or sore throat, or other symptoms of a cold or flu. Do not treat yourself. This drug may decrease your body's ability to fight infection. Try to avoid being around people who are sick. You should make sure you get enough calcium and vitamin D while you are taking this medicine, unless your doctor tells  you not to. Discuss the foods you eat and the vitamins you take with your health care professional. See your dentist regularly. Brush and floss your teeth as directed. Before you have any dental work done, tell your dentist you are receiving this medicine. Do not become pregnant while taking this medicine or for 5 months after stopping it. Talk with your doctor or health care professional about your birth control options while taking this medicine. Women should inform their doctor if they wish to become pregnant or think they might be pregnant. There is a potential for serious side effects to an unborn child. Talk to your  health care professional or pharmacist for more information. What side effects may I notice from receiving this medicine? Side effects that you should report to your doctor or health care professional as soon as possible:  allergic reactions like skin rash, itching or hives, swelling of the face, lips, or tongue  bone pain  breathing problems  dizziness  jaw pain, especially after dental work  redness, blistering, peeling of the skin  signs and symptoms of infection like fever or chills; cough; sore throat; pain or trouble passing urine  signs of low calcium like fast heartbeat, muscle cramps or muscle pain; pain, tingling, numbness in the hands or feet; seizures  unusual bleeding or bruising  unusually weak or tired Side effects that usually do not require medical attention (report to your doctor or health care professional if they continue or are bothersome):  constipation  diarrhea  headache  joint pain  loss of appetite  muscle pain  runny nose  tiredness  upset stomach This list may not describe all possible side effects. Call your doctor for medical advice about side effects. You may report side effects to FDA at 1-800-FDA-1088. Where should I keep my medicine? This medicine is only given in a clinic, doctor's office, or other health care setting and will not be stored at home. NOTE: This sheet is a summary. It may not cover all possible information. If you have questions about this medicine, talk to your doctor, pharmacist, or health care provider.  2020 [employer]/Gold Standard (2018-03-20 16:10:44)  

## 2020-08-30 ENCOUNTER — Ambulatory Visit (INDEPENDENT_AMBULATORY_CARE_PROVIDER_SITE_OTHER): Payer: Medicare Other | Admitting: Emergency Medicine

## 2020-08-30 DIAGNOSIS — Z23 Encounter for immunization: Secondary | ICD-10-CM

## 2020-08-30 NOTE — Progress Notes (Signed)
Wendy [name] is a 79 y.o. female presents to the office today for Influenza vaccine, per physician's orders. Original order: Influenza vaccine Fluad Quadrivalent (med), IM (route) was administered Right deltoid (location) today. Patient tolerated injection.   Leonidas Romberg

## 2020-09-26 ENCOUNTER — Other Ambulatory Visit: Payer: Self-pay

## 2020-09-26 ENCOUNTER — Inpatient Hospital Stay: Payer: Medicare Other | Attending: Hematology and Oncology

## 2020-09-26 VITALS — BP 135/59 | HR 52 | Temp 97.8°F | Resp 18

## 2020-09-26 DIAGNOSIS — C7951 Secondary malignant neoplasm of bone: Secondary | ICD-10-CM | POA: Insufficient documentation

## 2020-09-26 DIAGNOSIS — C7800 Secondary malignant neoplasm of unspecified lung: Secondary | ICD-10-CM

## 2020-09-26 DIAGNOSIS — M542 Cervicalgia: Secondary | ICD-10-CM | POA: Diagnosis not present

## 2020-09-26 DIAGNOSIS — R197 Diarrhea, unspecified: Secondary | ICD-10-CM | POA: Insufficient documentation

## 2020-09-26 DIAGNOSIS — Z9012 Acquired absence of left breast and nipple: Secondary | ICD-10-CM | POA: Insufficient documentation

## 2020-09-26 DIAGNOSIS — C50412 Malignant neoplasm of upper-outer quadrant of left female breast: Secondary | ICD-10-CM | POA: Diagnosis not present

## 2020-09-26 DIAGNOSIS — R5383 Other fatigue: Secondary | ICD-10-CM | POA: Diagnosis not present

## 2020-09-26 DIAGNOSIS — R634 Abnormal weight loss: Secondary | ICD-10-CM | POA: Diagnosis not present

## 2020-09-26 DIAGNOSIS — Z5111 Encounter for antineoplastic chemotherapy: Secondary | ICD-10-CM | POA: Diagnosis not present

## 2020-09-26 DIAGNOSIS — N289 Disorder of kidney and ureter, unspecified: Secondary | ICD-10-CM | POA: Diagnosis not present

## 2020-09-26 DIAGNOSIS — Z170 Estrogen receptor positive status [ER+]: Secondary | ICD-10-CM | POA: Insufficient documentation

## 2020-09-26 DIAGNOSIS — Z923 Personal history of irradiation: Secondary | ICD-10-CM | POA: Diagnosis not present

## 2020-09-26 MED ORDER — FULVESTRANT 250 MG/5ML IM SOLN
500.0000 mg | Freq: Once | INTRAMUSCULAR | Status: AC
  Administered 2020-09-26: 500 mg via INTRAMUSCULAR

## 2020-09-26 MED ORDER — FULVESTRANT 250 MG/5ML IM SOLN
INTRAMUSCULAR | Status: AC
  Filled 2020-09-26: qty 10

## 2020-09-26 NOTE — Patient Instructions (Signed)
Fulvestrant injection What is this medicine? FULVESTRANT (ful VES trant) blocks the effects of estrogen. It is used to treat breast cancer. This medicine may be used for other purposes; ask your health care provider or pharmacist if you have questions. COMMON BRAND NAME(S): FASLODEX What should I tell my health care provider before I take this medicine? They need to know if you have any of these conditions:  bleeding disorders  liver disease  low blood counts, like low white cell, platelet, or red cell counts  an unusual or allergic reaction to fulvestrant, other medicines, foods, dyes, or preservatives  pregnant or trying to get pregnant  breast-feeding How should I use this medicine? This medicine is for injection into a muscle. It is usually given by a health care professional in a hospital or clinic setting. Talk to your pediatrician regarding the use of this medicine in children. Special care may be needed. Overdosage: If you think you have taken too much of this medicine contact a poison control center or emergency room at once. NOTE: This medicine is only for you. Do not share this medicine with others. What if I miss a dose? It is important not to miss your dose. Call your doctor or health care professional if you are unable to keep an appointment. What may interact with this medicine?  medicines that treat or prevent blood clots like warfarin, enoxaparin, dalteparin, apixaban, dabigatran, and rivaroxaban This list may not describe all possible interactions. Give your health care provider a list of all the medicines, herbs, non-prescription drugs, or dietary supplements you use. Also tell them if you smoke, drink alcohol, or use illegal drugs. Some items may interact with your medicine. What should I watch for while using this medicine? Your condition will be monitored carefully while you are receiving this medicine. You will need important blood work done while you are taking  this medicine. Do not become pregnant while taking this medicine or for at least 1 year after stopping it. Women of child-bearing potential will need to have a negative pregnancy test before starting this medicine. Women should inform their doctor if they wish to become pregnant or think they might be pregnant. There is a potential for serious side effects to an unborn child. Men should inform their doctors if they wish to father a child. This medicine may lower sperm counts. Talk to your health care professional or pharmacist for more information. Do not breast-feed an infant while taking this medicine or for 1 year after the last dose. What side effects may I notice from receiving this medicine? Side effects that you should report to your doctor or health care professional as soon as possible:  allergic reactions like skin rash, itching or hives, swelling of the face, lips, or tongue  feeling faint or lightheaded, falls  pain, tingling, numbness, or weakness in the legs  signs and symptoms of infection like fever or chills; cough; flu-like symptoms; sore throat  vaginal bleeding Side effects that usually do not require medical attention (report to your doctor or health care professional if they continue or are bothersome):  aches, pains  constipation  diarrhea  headache  hot flashes  nausea, vomiting  pain at site where injected  stomach pain This list may not describe all possible side effects. Call your doctor for medical advice about side effects. You may report side effects to FDA at 1-800-FDA-1088. Where should I keep my medicine? This drug is given in a hospital or clinic and will   not be stored at home. NOTE: This sheet is a summary. It may not cover all possible information. If you have questions about this medicine, talk to your doctor, pharmacist, or health care provider.  2020 Elsevier/Gold Standard (2018-02-19 11:34:41)  

## 2020-10-17 DIAGNOSIS — C787 Secondary malignant neoplasm of liver and intrahepatic bile duct: Secondary | ICD-10-CM | POA: Diagnosis not present

## 2020-10-17 DIAGNOSIS — C50412 Malignant neoplasm of upper-outer quadrant of left female breast: Secondary | ICD-10-CM | POA: Diagnosis not present

## 2020-10-17 DIAGNOSIS — Z515 Encounter for palliative care: Secondary | ICD-10-CM | POA: Diagnosis not present

## 2020-10-17 DIAGNOSIS — C7800 Secondary malignant neoplasm of unspecified lung: Secondary | ICD-10-CM | POA: Diagnosis not present

## 2020-10-17 DIAGNOSIS — C7951 Secondary malignant neoplasm of bone: Secondary | ICD-10-CM | POA: Diagnosis not present

## 2020-10-24 ENCOUNTER — Ambulatory Visit (HOSPITAL_COMMUNITY)
Admission: RE | Admit: 2020-10-24 | Discharge: 2020-10-24 | Disposition: A | Discharge status: Home / Self Care | Payer: Medicare Other | Source: Ambulatory Visit | Attending: Hematology and Oncology | Admitting: Hematology and Oncology

## 2020-10-24 ENCOUNTER — Other Ambulatory Visit: Payer: Self-pay

## 2020-10-24 ENCOUNTER — Encounter (HOSPITAL_COMMUNITY): Payer: Self-pay

## 2020-10-24 DIAGNOSIS — C3490 Malignant neoplasm of unspecified part of unspecified bronchus or lung: Secondary | ICD-10-CM | POA: Diagnosis not present

## 2020-10-24 DIAGNOSIS — C787 Secondary malignant neoplasm of liver and intrahepatic bile duct: Secondary | ICD-10-CM | POA: Diagnosis not present

## 2020-10-24 DIAGNOSIS — C50412 Malignant neoplasm of upper-outer quadrant of left female breast: Secondary | ICD-10-CM

## 2020-10-24 DIAGNOSIS — C50919 Malignant neoplasm of unspecified site of unspecified female breast: Secondary | ICD-10-CM | POA: Diagnosis not present

## 2020-10-24 DIAGNOSIS — Z170 Estrogen receptor positive status [ER+]: Secondary | ICD-10-CM | POA: Insufficient documentation

## 2020-10-24 DIAGNOSIS — I2510 Atherosclerotic heart disease of native coronary artery without angina pectoris: Secondary | ICD-10-CM | POA: Diagnosis not present

## 2020-10-24 DIAGNOSIS — K3189 Other diseases of stomach and duodenum: Secondary | ICD-10-CM | POA: Diagnosis not present

## 2020-10-24 DIAGNOSIS — C7800 Secondary malignant neoplasm of unspecified lung: Secondary | ICD-10-CM | POA: Diagnosis not present

## 2020-10-24 DIAGNOSIS — Q433 Congenital malformations of intestinal fixation: Secondary | ICD-10-CM | POA: Diagnosis not present

## 2020-10-24 DIAGNOSIS — C7951 Secondary malignant neoplasm of bone: Secondary | ICD-10-CM | POA: Diagnosis not present

## 2020-10-24 LAB — POCT I-STAT CREATININE: Creatinine, Ser: 1.4 mg/dL — ABNORMAL HIGH (ref 0.44–1.00)

## 2020-10-24 MED ORDER — IOHEXOL 300 MG/ML  SOLN
75.0000 mL | Freq: Once | INTRAMUSCULAR | Status: AC | PRN
  Administered 2020-10-24: 75 mL via INTRAVENOUS

## 2020-10-26 NOTE — Assessment & Plan Note (Signed)
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 She had radiation therapy and has been on antiestrogen therapy with Arimidex 06/28/2014-January 2019  Thoracentesis: Met Breast cancer ER PR Positive and Her 2 Neg  Ibrance with Faslodex 12/29/2017-02/27/2019 [facility] molecular testing: PIK3CA mutation present, androgen receptor positive, negative for BRCA1, 2 and PDL 1.  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 4.Renal insufficiency:Creatininecontinues to fluctuate 5.  Severe fatigue: Limiting her activities.  I recommended reduction in dosage of Piqray to 200 mg daily.  I sent a new prescription to her pharmacy.  Until the new prescription arrives she will take only 1 tablet of the 150 mg daily.  Monitoring her blood sugars.:  129  CT CAP9/01/2020:Hepatic and bone metastases (2.6 x 4.7 cm, 5.7 x 6.5 cm) stable. Stable posttreatment changes in the left hemithorax. Bone scan 07/28/2020: Mixed response to therapy with interval improvement in the calvarial and spinal metastasis  with exception of L2 vertebral body and progressive disease involving right ribs  CT CAP: 10/24/20: Similar Liver mets, Similar bone mets,   Return to clinic monthly for injections labs

## 2020-10-26 NOTE — Progress Notes (Signed)
Patient Care Team: Delorse Limber as PCP - General (Family Medicine)  DIAGNOSIS:    ICD-10-CM   1. Malignant neoplasm of upper-outer quadrant of left breast in female, estrogen receptor positive ([place])  C50.412    Z17.0     SUMMARY OF ONCOLOGIC HISTORY: Oncology History  Malignant neoplasm of upper-outer quadrant of left breast in female, estrogen receptor positive ([place])  07/28/2013 Mammogram   Spiculated mass at 6:00 position 3 cm, ultrasound revealed 2.6 x 2.1 x 1.5 cm second mass at 12:00 2.1 x 1.8 x 2.1 cm: 2 abnormal lymph nodes left axilla   10/29/2013 Initial Diagnosis   Breast cancer of upper-outer quadrant of left female breast: 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 [name], T3N2 (5/10 LN positive) stage IIIa IDC grade 1, ER/PR 100%, TSH 14% HER-2 negative PET/CT 12/20/2013 negative for metastases   12/17/2013 - 04/02/2014 Chemotherapy   Taxotere Cytoxan every 3 weeks for 6 cycles   05/11/2014 - 06/27/2014 Radiation Therapy   Adjuvant radiation therapy to chest wall and lymph nodes   07/06/2014 -  Anti-estrogen oral therapy   Arimidex 1 mg daily   12/21/2017 Imaging   Metastatic lesions noted in the lungs bilaterally, bilateral hilar and mediastinal lymph nodes, left pleural effusion, multiple thoracic vertebral metastases, heterogeneous lesions in the liver   12/22/2017 Procedure   Pleural Effusion Thoracentesis: Metastatic Breast cancer ER PR Positive Her 2 Neg   12/29/2017 Treatment Plan Change   Ibrance, Faslodex and Xgeva   03/09/2019 Treatment Plan Change   Everolimus and Exemestane   06/25/2019 Pathology Results   Liver biopsy: 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, ER 100%, PR 0%, Ki-67 10%, HER-2 -1+ by [facility]   12/13/2019 Miscellaneous   Piqray with Lorri Frederick and Delton See     CHIEF COMPLIANT: Follow-up of metastatic breast cancer  INTERVAL HISTORY:  Wendy [name] is a 79 y.o. with above-mentioned history of metastatic breast cancer currently on treatment withalpelisib,Faslodexand Xgeva.CT CAP in 10/24/20 showed stable hepatic lesions and stable osseous metastases. She presents to the clinic todayfor a toxicity check and to review her scan.  She has fallen at home and hurt her elbow. She hit her head but she did not have any major concussion.  ALLERGIES:  has No Known Allergies.  MEDICATIONS:  Current Outpatient Medications  Medication Sig Dispense Refill  . acetaminophen (TYLENOL) 500 MG tablet Take 1,000 mg by mouth every 6 (six) hours as needed for mild pain.    [name] alpelisib (PIQRAY 200MG DAILY DOSE) 200 MG Therapy Pack Take one 26m tablet with food at the same time daily. Swallow whole, do not crush, chew, or split. 30 each 6  . atorvastatin (LIPITOR) 20 MG tablet TAKE 1 TABLET BY MOUTH EVERY NIGHT AT BEDTIME 90 tablet 3  . Calcium Carb-Cholecalciferol (CALCIUM 600 + D PO) Take 1 tablet by mouth daily.    . ferrous sulfate (SLOW IRON) 160 (50 Fe) MG TBCR SR tablet Take 1 tablet (160 mg total) by mouth daily. 30 tablet 0  . hydrochlorothiazide (MICROZIDE) 12.5 MG capsule TAKE 1 CAPSULE(12.5 MG) BY MOUTH DAILY 90 capsule 2  . loratadine-pseudoephedrine (CLARITIN-D 24 HOUR) 10-240 MG 24 hr tablet Take 1 tablet by mouth daily.    . Multiple Vitamin (MULTIVITAMIN WITH MINERALS) TABS tablet Take 1 tablet by mouth daily.    . nitroGLYCERIN (NITROSTAT) 0.4 MG SL tablet PLACE 1 TABLET (  0.4 MG TOTAL) UNDER THE TONGUE EVERY 5 (FIVE) MINUTES AS NEEDED FOR CHEST PAIN. 25 tablet 0  . ondansetron (ZOFRAN ODT) 8 MG disintegrating tablet Take 1 tablet (8 mg total) by mouth every 8 (eight) hours as needed for nausea or vomiting. 30 tablet 3  . potassium chloride SA (KLOR-CON) 20 MEQ tablet TAKE 1 TABLET BY MOUTH ONCE DAILY 30 tablet 3   No current facility-administered medications for this visit.    PHYSICAL EXAMINATION: ECOG PERFORMANCE  STATUS: 1 - Symptomatic but completely ambulatory  Vitals:   10/27/20 0937  BP: (!) 140/58  Pulse: 62  Resp: 17  Temp: (!) 97.4 F (36.3 C)  SpO2: 99%   Filed Weights   10/27/20 0937  Weight: 140 lb (63.5 kg)    LABORATORY DATA:  I have reviewed the data as listed CMP Latest Ref Rng & Units 10/27/2020 10/24/2020 08/29/2020  Glucose 70 - 99 mg/dL 159(H) - 209(H)  BUN 8 - 23 mg/dL 19 - 17  Creatinine 0.44 - 1.00 mg/dL 1.44(H) 1.40(H) 1.41(H)  Sodium 135 - 145 mmol/L 140 - 136  Potassium 3.5 - 5.1 mmol/L 3.2(L) - 3.5  Chloride 98 - 111 mmol/L 94(L) - 97(L)  CO2 22 - 32 mmol/L 35(H) - 33(H)  Calcium 8.9 - 10.3 mg/dL 10.5(H) - 9.8  Total Protein 6.5 - 8.1 g/dL 7.3 - 7.5  Total Bilirubin 0.3 - 1.2 mg/dL 0.6 - 0.8  Alkaline Phos 38 - 126 U/L 247(H) - 217(H)  AST 15 - 41 U/L 69(H) - 47(H)  ALT 0 - 44 U/L 34 - 30    Lab Results  Component Value Date   WBC 5.7 10/27/2020   HGB 13.1 10/27/2020   HCT 39.0 10/27/2020   MCV 93.3 10/27/2020   PLT 236 10/27/2020   NEUTROABS 3.1 10/27/2020    ASSESSMENT & PLAN:  Malignant neoplasm of upper-outer quadrant of left breast in female, 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 She had radiation therapy and has been on antiestrogen therapy with Arimidex 06/28/2014-January 2019  Thoracentesis: Met Breast cancer ER PR Positive and Her 2 Neg  Ibrance with Faslodex 12/29/2017-02/27/2019 [facility] molecular testing: PIK3CA mutation present, androgen receptor positive, negative for BRCA1, 2 and PDL 1.  Exemestane with everolimus started 03/09/2019 06/25/2019:Liver biopsy: 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, ER 100%, PR 0%, Ki-67 10%, HER-2 -1+ by IHC I discussed the [facility] molecular testing results with the patient which showed  PI K3 CA mutation as well as ER positivity.  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 4.Renal insufficiency:Creatininecontinues to fluctuate 5.  Severe fatigue: Limiting her activities.  I recommended reduction in dosage of Piqray to 200 mg daily.  I sent a new prescription to her pharmacy.  Until the new prescription arrives she will take only 1 tablet of the 150 mg daily.  Monitoring her blood sugars.:  129  CT CAP9/01/2020:Hepatic and bone metastases (2.6 x 4.7 cm, 5.7 x 6.5 cm) stable. Stable posttreatment changes in the left hemithorax. Bone scan 07/28/2020: Mixed response to therapy with interval improvement in the  calvarial and spinal metastasis with exception of L2 vertebral body and progressive disease involving right ribs  CT CAP: 10/24/20: Similar Liver mets, Similar bone mets: Stable disease  Return to clinic monthly for injections labs Return to clinic to see me in 3 months.   No orders of the defined types were placed in this encounter.  The patient has a good understanding of the overall plan. she agrees with it. she will call with any problems that may develop before the next visit here.  Total time spent: 30 mins including face to face time and time spent for planning, charting and coordination of care  [name] Lose, MD 10/27/2020  I, Cloyde Reams Dorshimer, am acting as scribe for Dr. [name] Lose.  I have reviewed the above documentation for accuracy and completeness, and I agree with the  above.

## 2020-10-27 ENCOUNTER — Inpatient Hospital Stay: Payer: Medicare Other | Attending: Hematology and Oncology | Admitting: Hematology and Oncology

## 2020-10-27 ENCOUNTER — Other Ambulatory Visit: Payer: Self-pay

## 2020-10-27 ENCOUNTER — Inpatient Hospital Stay: Payer: Medicare Other

## 2020-10-27 ENCOUNTER — Telehealth: Payer: Self-pay

## 2020-10-27 DIAGNOSIS — Z170 Estrogen receptor positive status [ER+]: Secondary | ICD-10-CM | POA: Diagnosis not present

## 2020-10-27 DIAGNOSIS — Z5111 Encounter for antineoplastic chemotherapy: Secondary | ICD-10-CM | POA: Diagnosis not present

## 2020-10-27 DIAGNOSIS — C7951 Secondary malignant neoplasm of bone: Secondary | ICD-10-CM | POA: Diagnosis not present

## 2020-10-27 DIAGNOSIS — Z23 Encounter for immunization: Secondary | ICD-10-CM

## 2020-10-27 DIAGNOSIS — R197 Diarrhea, unspecified: Secondary | ICD-10-CM | POA: Insufficient documentation

## 2020-10-27 DIAGNOSIS — R5383 Other fatigue: Secondary | ICD-10-CM | POA: Insufficient documentation

## 2020-10-27 DIAGNOSIS — N289 Disorder of kidney and ureter, unspecified: Secondary | ICD-10-CM | POA: Diagnosis not present

## 2020-10-27 DIAGNOSIS — Z923 Personal history of irradiation: Secondary | ICD-10-CM | POA: Insufficient documentation

## 2020-10-27 DIAGNOSIS — M542 Cervicalgia: Secondary | ICD-10-CM | POA: Diagnosis not present

## 2020-10-27 DIAGNOSIS — C50412 Malignant neoplasm of upper-outer quadrant of left female breast: Secondary | ICD-10-CM

## 2020-10-27 DIAGNOSIS — Z9012 Acquired absence of left breast and nipple: Secondary | ICD-10-CM | POA: Diagnosis not present

## 2020-10-27 DIAGNOSIS — C7800 Secondary malignant neoplasm of unspecified lung: Secondary | ICD-10-CM

## 2020-10-27 DIAGNOSIS — R634 Abnormal weight loss: Secondary | ICD-10-CM | POA: Insufficient documentation

## 2020-10-27 LAB — CBC WITH DIFFERENTIAL (CANCER CENTER ONLY)
Abs Immature Granulocytes: 0.02 10*3/uL (ref 0.00–0.07)
Basophils Absolute: 0 10*3/uL (ref 0.0–0.1)
Basophils Relative: 1 %
Eosinophils Absolute: 0.4 10*3/uL (ref 0.0–0.5)
Eosinophils Relative: 7 %
HCT: 39 % (ref 36.0–46.0)
Hemoglobin: 13.1 g/dL (ref 12.0–15.0)
Immature Granulocytes: 0 %
Lymphocytes Relative: 25 %
Lymphs Abs: 1.4 10*3/uL (ref 0.7–4.0)
MCH: 31.3 pg (ref 26.0–34.0)
MCHC: 33.6 g/dL (ref 30.0–36.0)
MCV: 93.3 fL (ref 80.0–100.0)
Monocytes Absolute: 0.7 10*3/uL (ref 0.1–1.0)
Monocytes Relative: 13 %
Neutro Abs: 3.1 10*3/uL (ref 1.7–7.7)
Neutrophils Relative %: 54 %
Platelet Count: 236 10*3/uL (ref 150–400)
RBC: 4.18 MIL/uL (ref 3.87–5.11)
RDW: 15.6 % — ABNORMAL HIGH (ref 11.5–15.5)
WBC Count: 5.7 10*3/uL (ref 4.0–10.5)
nRBC: 0 % (ref 0.0–0.2)

## 2020-10-27 LAB — CMP (CANCER CENTER ONLY)
ALT: 34 U/L (ref 0–44)
AST: 69 U/L — ABNORMAL HIGH (ref 15–41)
Albumin: 3.9 g/dL (ref 3.5–5.0)
Alkaline Phosphatase: 247 U/L — ABNORMAL HIGH (ref 38–126)
Anion gap: 11 (ref 5–15)
BUN: 19 mg/dL (ref 8–23)
CO2: 35 mmol/L — ABNORMAL HIGH (ref 22–32)
Calcium: 10.5 mg/dL — ABNORMAL HIGH (ref 8.9–10.3)
Chloride: 94 mmol/L — ABNORMAL LOW (ref 98–111)
Creatinine: 1.44 mg/dL — ABNORMAL HIGH (ref 0.44–1.00)
GFR, Estimated: 37 mL/min — ABNORMAL LOW (ref >60)
Glucose, Bld: 159 mg/dL — ABNORMAL HIGH (ref 70–99)
Potassium: 3.2 mmol/L — ABNORMAL LOW (ref 3.5–5.1)
Sodium: 140 mmol/L (ref 135–145)
Total Bilirubin: 0.6 mg/dL (ref 0.3–1.2)
Total Protein: 7.3 g/dL (ref 6.5–8.1)

## 2020-10-27 MED ORDER — FULVESTRANT 250 MG/5ML IM SOLN
INTRAMUSCULAR | Status: AC
  Filled 2020-10-27: qty 10

## 2020-10-27 MED ORDER — FULVESTRANT 250 MG/5ML IM SOLN
500.0000 mg | Freq: Once | INTRAMUSCULAR | Status: AC
  Administered 2020-10-27: 500 mg via INTRAMUSCULAR

## 2020-10-27 NOTE — Patient Instructions (Signed)
Fulvestrant injection What is this medicine? FULVESTRANT (ful VES trant) blocks the effects of estrogen. It is used to treat breast cancer. This medicine may be used for other purposes; ask your health care provider or pharmacist if you have questions. COMMON BRAND NAME(S): FASLODEX What should I tell my health care provider before I take this medicine? They need to know if you have any of these conditions:  bleeding disorders  liver disease  low blood counts, like low white cell, platelet, or red cell counts  an unusual or allergic reaction to fulvestrant, other medicines, foods, dyes, or preservatives  pregnant or trying to get pregnant  breast-feeding How should I use this medicine? This medicine is for injection into a muscle. It is usually given by a health care professional in a hospital or clinic setting. Talk to your pediatrician regarding the use of this medicine in children. Special care may be needed. Overdosage: If you think you have taken too much of this medicine contact a poison control center or emergency room at once. NOTE: This medicine is only for you. Do not share this medicine with others. What if I miss a dose? It is important not to miss your dose. Call your doctor or health care professional if you are unable to keep an appointment. What may interact with this medicine?  medicines that treat or prevent blood clots like warfarin, enoxaparin, dalteparin, apixaban, dabigatran, and rivaroxaban This list may not describe all possible interactions. Give your health care provider a list of all the medicines, herbs, non-prescription drugs, or dietary supplements you use. Also tell them if you smoke, drink alcohol, or use illegal drugs. Some items may interact with your medicine. What should I watch for while using this medicine? Your condition will be monitored carefully while you are receiving this medicine. You will need important blood work done while you are taking  this medicine. Do not become pregnant while taking this medicine or for at least 1 year after stopping it. Women of child-bearing potential will need to have a negative pregnancy test before starting this medicine. Women should inform their doctor if they wish to become pregnant or think they might be pregnant. There is a potential for serious side effects to an unborn child. Men should inform their doctors if they wish to father a child. This medicine may lower sperm counts. Talk to your health care professional or pharmacist for more information. Do not breast-feed an infant while taking this medicine or for 1 year after the last dose. What side effects may I notice from receiving this medicine? Side effects that you should report to your doctor or health care professional as soon as possible:  allergic reactions like skin rash, itching or hives, swelling of the face, lips, or tongue  feeling faint or lightheaded, falls  pain, tingling, numbness, or weakness in the legs  signs and symptoms of infection like fever or chills; cough; flu-like symptoms; sore throat  vaginal bleeding Side effects that usually do not require medical attention (report to your doctor or health care professional if they continue or are bothersome):  aches, pains  constipation  diarrhea  headache  hot flashes  nausea, vomiting  pain at site where injected  stomach pain This list may not describe all possible side effects. Call your doctor for medical advice about side effects. You may report side effects to FDA at 1-800-FDA-1088. Where should I keep my medicine? This drug is given in a hospital or clinic and will   not be stored at home. NOTE: This sheet is a summary. It may not cover all possible information. If you have questions about this medicine, talk to your doctor, pharmacist, or health care provider.  2020 Elsevier/Gold Standard (2018-02-19 11:34:41)  

## 2020-10-30 DIAGNOSIS — H40013 Open angle with borderline findings, low risk, bilateral: Secondary | ICD-10-CM | POA: Diagnosis not present

## 2020-10-30 DIAGNOSIS — H43812 Vitreous degeneration, left eye: Secondary | ICD-10-CM | POA: Diagnosis not present

## 2020-10-30 DIAGNOSIS — H2512 Age-related nuclear cataract, left eye: Secondary | ICD-10-CM | POA: Diagnosis not present

## 2020-10-30 DIAGNOSIS — Z9841 Cataract extraction status, right eye: Secondary | ICD-10-CM | POA: Diagnosis not present

## 2020-10-30 DIAGNOSIS — H25812 Combined forms of age-related cataract, left eye: Secondary | ICD-10-CM | POA: Diagnosis not present

## 2020-10-31 NOTE — Telephone Encounter (Signed)
Oral Oncology Patient Advocate Encounter  Met patient in [place] to complete a re-enrollment application for [employer] [facility] [place] ([facility]) in an effort to reduce the patient's out of pocket expense for Piqray to $0.    Application completed and faxed to [phone].   [employer] phone number for follow up is [phone].   This encounter will be updated until final determination.   [facility] [place] Phone [phone] Fax [phone] 10/31/2020 2:01 PM

## 2020-11-01 ENCOUNTER — Telehealth: Payer: Self-pay | Admitting: Hematology and Oncology

## 2020-11-01 NOTE — Telephone Encounter (Signed)
Scheduled per 12/3 los. Called and spoke with pt, confirmed 3/3 appts

## 2020-11-01 NOTE — H&P (Signed)
Surgical History & Physical  Patient Name: Wendy [name] DOB: Jul 01, 1941  Surgery: Cataract extraction with intraocular lens implant phacoemulsification; Left Eye  Surgeon: Baruch Goldmann MD Surgery Date:  11/15/2020 Pre-Op Date:  10/30/2020  HPI: A 52 Yr. old female patient is referred by Dr Wynetta Emery for cataract eval. 1. 1. The patient complains of difficulty when reading closed caption, fine letters which began 5 months ago. The left eye is affected. The episode is gradual. The condition's severity increased since last visit. Symptoms occur when the patient is inside and outside. This is negatively affecting the patient's quality of life. 2. Cataract surgery right eye is doing well - vision is good and stable. HPI was performed by Baruch Goldmann  Medical History: Cataracts Posterior Vitreous attachment OS Cancer Heart Problem High Blood Pressure  Review of Systems Negative Allergic/Immunologic Negative Cardiovascular Negative Constitutional Negative Ear, Nose, Mouth & Throat Negative Gastrointestinal Negative Genitourinary Negative Neurological Negative Respiratory  Social   Former smoker  Medication Artifical Tears, Brom/Gati/Pred,  IBRANCE, MEGESTROL ACETATE, ATORVASTATIN CALCIUM, HYDROCHLOROTHIAZIDE, PROMETHAZINE HCL, NITROGLYCERIN, OXYCOD/APAP TAB,   Sx/Procedures Phaco c IOL,  Cardiac stent, Mastectomy L, Kidney donor, Hysterectomy,   Drug Allergies   NKDA  History & Physical: Heent:  Cataract, Left eye NECK: supple without bruits LUNGS: lungs clear to auscultation CV: regular rate and rhythm Abdomen: soft and non-tender  Impression & Plan: Assessment: 1.  COMBINED FORMS AGE RELATED CATARACT; , Left Eye (H25.812) 2.  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  OAG BORDERLINE FINDINGS LOW RISK ; Both Eyes (H40.013) 4.  BLEPHARITIS ; , Right Lower Lid, Left Upper Lid, Left Lower Lid, Right Upper Lid (H01.001, H01.002,H01.004,H01.005) 5.  INTRAOCULAR  LENS IOL (Z96.1)  Plan: 1.  Cataract accounts for the patient's decreased vision. This visual impairment is not correctable with a tolerable change in glasses or contact lenses. Cataract surgery with an implantation of a new lens should significantly improve the visual and functional status of the patient. Discussed all risks, benefits, alternatives, and potential complications. Discussed the procedures and recovery. Patient desires to have surgery. A-scan ordered and performed today for intra-ocular lens calculations. The surgery will be performed in order to improve vision for driving, reading, and for eye examinations. Recommend phacoemulsification with intra-ocular lens. Recommend Dextenza for post-operative pain and inflammation. Left Eye. Surgery required to correct imbalance of vision. Dilates well - shugarcaine by protocol. 2.  Old Asymptomatic. RD precautions given. Patient to call with increase in flashing lights/floaters/dark curtain. 3.  Based on cup-to-disc ratio. Negative Family history. OCT rNFL wnl previously. Detailed discussion about glaucoma today including importance of maintaining good follow up and following treatment plan, and the possibility of irreversible blindness as part of this disease process. 4.  recommend regular lid cleaning 5.  Stable. Doing well since surgery

## 2020-11-09 ENCOUNTER — Encounter (HOSPITAL_COMMUNITY): Payer: Medicare Other

## 2020-11-13 ENCOUNTER — Other Ambulatory Visit (HOSPITAL_COMMUNITY): Payer: Medicare Other

## 2020-11-27 ENCOUNTER — Inpatient Hospital Stay: Payer: Medicare Other | Attending: Hematology and Oncology

## 2020-11-27 ENCOUNTER — Inpatient Hospital Stay: Payer: Medicare Other

## 2020-11-27 ENCOUNTER — Other Ambulatory Visit: Payer: Self-pay

## 2020-11-27 VITALS — BP 115/48 | HR 76 | Resp 17

## 2020-11-27 DIAGNOSIS — Z170 Estrogen receptor positive status [ER+]: Secondary | ICD-10-CM | POA: Diagnosis not present

## 2020-11-27 DIAGNOSIS — M542 Cervicalgia: Secondary | ICD-10-CM | POA: Diagnosis not present

## 2020-11-27 DIAGNOSIS — Z9012 Acquired absence of left breast and nipple: Secondary | ICD-10-CM | POA: Insufficient documentation

## 2020-11-27 DIAGNOSIS — R634 Abnormal weight loss: Secondary | ICD-10-CM | POA: Diagnosis not present

## 2020-11-27 DIAGNOSIS — Z923 Personal history of irradiation: Secondary | ICD-10-CM | POA: Diagnosis not present

## 2020-11-27 DIAGNOSIS — C50412 Malignant neoplasm of upper-outer quadrant of left female breast: Secondary | ICD-10-CM | POA: Insufficient documentation

## 2020-11-27 DIAGNOSIS — C7951 Secondary malignant neoplasm of bone: Secondary | ICD-10-CM | POA: Diagnosis not present

## 2020-11-27 DIAGNOSIS — Z5111 Encounter for antineoplastic chemotherapy: Secondary | ICD-10-CM | POA: Insufficient documentation

## 2020-11-27 DIAGNOSIS — R197 Diarrhea, unspecified: Secondary | ICD-10-CM | POA: Insufficient documentation

## 2020-11-27 DIAGNOSIS — R5383 Other fatigue: Secondary | ICD-10-CM | POA: Insufficient documentation

## 2020-11-27 DIAGNOSIS — N289 Disorder of kidney and ureter, unspecified: Secondary | ICD-10-CM | POA: Insufficient documentation

## 2020-11-27 DIAGNOSIS — C7800 Secondary malignant neoplasm of unspecified lung: Secondary | ICD-10-CM

## 2020-11-27 LAB — CBC WITH DIFFERENTIAL (CANCER CENTER ONLY)
Abs Immature Granulocytes: 0.01 10*3/uL (ref 0.00–0.07)
Basophils Absolute: 0.1 10*3/uL (ref 0.0–0.1)
Basophils Relative: 1 %
Eosinophils Absolute: 0.2 10*3/uL (ref 0.0–0.5)
Eosinophils Relative: 3 %
HCT: 40.5 % (ref 36.0–46.0)
Hemoglobin: 14 g/dL (ref 12.0–15.0)
Immature Granulocytes: 0 %
Lymphocytes Relative: 17 %
Lymphs Abs: 1.1 10*3/uL (ref 0.7–4.0)
MCH: 31.7 pg (ref 26.0–34.0)
MCHC: 34.6 g/dL (ref 30.0–36.0)
MCV: 91.6 fL (ref 80.0–100.0)
Monocytes Absolute: 0.6 10*3/uL (ref 0.1–1.0)
Monocytes Relative: 9 %
Neutro Abs: 4.4 10*3/uL (ref 1.7–7.7)
Neutrophils Relative %: 70 %
Platelet Count: 223 10*3/uL (ref 150–400)
RBC: 4.42 MIL/uL (ref 3.87–5.11)
RDW: 15.8 % — ABNORMAL HIGH (ref 11.5–15.5)
WBC Count: 6.3 10*3/uL (ref 4.0–10.5)
nRBC: 0 % (ref 0.0–0.2)

## 2020-11-27 LAB — CMP (CANCER CENTER ONLY)
ALT: 30 U/L (ref 0–44)
AST: 59 U/L — ABNORMAL HIGH (ref 15–41)
Albumin: 4 g/dL (ref 3.5–5.0)
Alkaline Phosphatase: 153 U/L — ABNORMAL HIGH (ref 38–126)
Anion gap: 8 (ref 5–15)
BUN: 17 mg/dL (ref 8–23)
CO2: 33 mmol/L — ABNORMAL HIGH (ref 22–32)
Calcium: 10.4 mg/dL — ABNORMAL HIGH (ref 8.9–10.3)
Chloride: 96 mmol/L — ABNORMAL LOW (ref 98–111)
Creatinine: 1.57 mg/dL — ABNORMAL HIGH (ref 0.44–1.00)
GFR, Estimated: 33 mL/min — ABNORMAL LOW (ref >60)
Glucose, Bld: 151 mg/dL — ABNORMAL HIGH (ref 70–99)
Potassium: 2.8 mmol/L — ABNORMAL LOW (ref 3.5–5.1)
Sodium: 137 mmol/L (ref 135–145)
Total Bilirubin: 0.8 mg/dL (ref 0.3–1.2)
Total Protein: 7.5 g/dL (ref 6.5–8.1)

## 2020-11-27 MED ORDER — FULVESTRANT 250 MG/5ML IM SOLN
INTRAMUSCULAR | Status: AC
  Filled 2020-11-27: qty 5

## 2020-11-27 MED ORDER — CYANOCOBALAMIN 1000 MCG/ML IJ SOLN
INTRAMUSCULAR | Status: AC
  Filled 2020-11-27: qty 1

## 2020-11-27 MED ORDER — DENOSUMAB 120 MG/1.7ML ~~LOC~~ SOLN
120.0000 mg | Freq: Once | SUBCUTANEOUS | Status: AC
  Administered 2020-11-27: 120 mg via SUBCUTANEOUS

## 2020-11-27 MED ORDER — FULVESTRANT 250 MG/5ML IM SOLN
500.0000 mg | Freq: Once | INTRAMUSCULAR | Status: AC
  Administered 2020-11-27: 500 mg via INTRAMUSCULAR

## 2020-11-27 MED ORDER — DENOSUMAB 120 MG/1.7ML ~~LOC~~ SOLN
SUBCUTANEOUS | Status: AC
  Filled 2020-11-27: qty 1.7

## 2020-11-27 NOTE — Patient Instructions (Signed)
Denosumab injection What is this medicine? DENOSUMAB (den oh sue mab) slows bone breakdown. Prolia is used to treat osteoporosis in women after menopause and in men, and in people who are taking corticosteroids for 6 months or more. Xgeva is used to treat a high calcium level due to cancer and to prevent bone fractures and other bone problems caused by multiple myeloma or cancer bone metastases. Xgeva is also used to treat giant cell tumor of the bone. This medicine may be used for other purposes; ask your health care provider or pharmacist if you have questions. COMMON BRAND NAME(S): Prolia, XGEVA What should I tell my health care provider before I take this medicine? They need to know if you have any of these conditions:  dental disease  having surgery or tooth extraction  infection  kidney disease  low levels of calcium or Vitamin D in the blood  malnutrition  on hemodialysis  skin conditions or sensitivity  thyroid or parathyroid disease  an unusual reaction to denosumab, other medicines, foods, dyes, or preservatives  pregnant or trying to get pregnant  breast-feeding How should I use this medicine? This medicine is for injection under the skin. It is given by a health care professional in a hospital or clinic setting. A special MedGuide will be given to you before each treatment. Be sure to read this information carefully each time. For Prolia, talk to your pediatrician regarding the use of this medicine in children. Special care may be needed. For Xgeva, talk to your pediatrician regarding the use of this medicine in children. While this drug may be prescribed for children as young as 13 years for selected conditions, precautions do apply. Overdosage: If you think you have taken too much of this medicine contact a poison control center or emergency room at once. NOTE: This medicine is only for you. Do not share this medicine with others. What if I miss a dose? It is  important not to miss your dose. Call your doctor or health care professional if you are unable to keep an appointment. What may interact with this medicine? Do not take this medicine with any of the following medications:  other medicines containing denosumab This medicine may also interact with the following medications:  medicines that lower your chance of fighting infection  steroid medicines like prednisone or cortisone This list may not describe all possible interactions. Give your health care provider a list of all the medicines, herbs, non-prescription drugs, or dietary supplements you use. Also tell them if you smoke, drink alcohol, or use illegal drugs. Some items may interact with your medicine. What should I watch for while using this medicine? Visit your doctor or health care professional for regular checks on your progress. Your doctor or health care professional may order blood tests and other tests to see how you are doing. Call your doctor or health care professional for advice if you get a fever, chills or sore throat, or other symptoms of a cold or flu. Do not treat yourself. This drug may decrease your body's ability to fight infection. Try to avoid being around people who are sick. You should make sure you get enough calcium and vitamin D while you are taking this medicine, unless your doctor tells you not to. Discuss the foods you eat and the vitamins you take with your health care professional. See your dentist regularly. Brush and floss your teeth as directed. Before you have any dental work done, tell your dentist you are   receiving this medicine. Do not become pregnant while taking this medicine or for 5 months after stopping it. Talk with your doctor or health care professional about your birth control options while taking this medicine. Women should inform their doctor if they wish to become pregnant or think they might be pregnant. There is a potential for serious side  effects to an unborn child. Talk to your health care professional or pharmacist for more information. What side effects may I notice from receiving this medicine? Side effects that you should report to your doctor or health care professional as soon as possible:  allergic reactions like skin rash, itching or hives, swelling of the face, lips, or tongue  bone pain  breathing problems  dizziness  jaw pain, especially after dental work  redness, blistering, peeling of the skin  signs and symptoms of infection like fever or chills; cough; sore throat; pain or trouble passing urine  signs of low calcium like fast heartbeat, muscle cramps or muscle pain; pain, tingling, numbness in the hands or feet; seizures  unusual bleeding or bruising  unusually weak or tired Side effects that usually do not require medical attention (report to your doctor or health care professional if they continue or are bothersome):  constipation  diarrhea  headache  joint pain  loss of appetite  muscle pain  runny nose  tiredness  upset stomach This list may not describe all possible side effects. Call your doctor for medical advice about side effects. You may report side effects to FDA at 1-800-FDA-1088. Where should I keep my medicine? This medicine is only given in a clinic, doctor's office, or other health care setting and will not be stored at home. NOTE: This sheet is a summary. It may not cover all possible information. If you have questions about this medicine, talk to your doctor, pharmacist, or health care provider.  2020 Elsevier/Gold Standard (2018-03-20 16:10:44) Fulvestrant injection What is this medicine? FULVESTRANT (ful VES trant) blocks the effects of estrogen. It is used to treat breast cancer. This medicine may be used for other purposes; ask your health care provider or pharmacist if you have questions. COMMON BRAND NAME(S): FASLODEX What should I tell my health care  provider before I take this medicine? They need to know if you have any of these conditions:  bleeding disorders  liver disease  low blood counts, like low white cell, platelet, or red cell counts  an unusual or allergic reaction to fulvestrant, other medicines, foods, dyes, or preservatives  pregnant or trying to get pregnant  breast-feeding How should I use this medicine? This medicine is for injection into a muscle. It is usually given by a health care professional in a hospital or clinic setting. Talk to your pediatrician regarding the use of this medicine in children. Special care may be needed. Overdosage: If you think you have taken too much of this medicine contact a poison control center or emergency room at once. NOTE: This medicine is only for you. Do not share this medicine with others. What if I miss a dose? It is important not to miss your dose. Call your doctor or health care professional if you are unable to keep an appointment. What may interact with this medicine?  medicines that treat or prevent blood clots like warfarin, enoxaparin, dalteparin, apixaban, dabigatran, and rivaroxaban This list may not describe all possible interactions. Give your health care provider a list of all the medicines, herbs, non-prescription drugs, or dietary supplements you use.   Also tell them if you smoke, drink alcohol, or use illegal drugs. Some items may interact with your medicine. What should I watch for while using this medicine? Your condition will be monitored carefully while you are receiving this medicine. You will need important blood work done while you are taking this medicine. Do not become pregnant while taking this medicine or for at least 1 year after stopping it. Women of child-bearing potential will need to have a negative pregnancy test before starting this medicine. Women should inform their doctor if they wish to become pregnant or think they might be pregnant. There is  a potential for serious side effects to an unborn child. Men should inform their doctors if they wish to father a child. This medicine may lower sperm counts. Talk to your health care professional or pharmacist for more information. Do not breast-feed an infant while taking this medicine or for 1 year after the last dose. What side effects may I notice from receiving this medicine? Side effects that you should report to your doctor or health care professional as soon as possible:  allergic reactions like skin rash, itching or hives, swelling of the face, lips, or tongue  feeling faint or lightheaded, falls  pain, tingling, numbness, or weakness in the legs  signs and symptoms of infection like fever or chills; cough; flu-like symptoms; sore throat  vaginal bleeding Side effects that usually do not require medical attention (report to your doctor or health care professional if they continue or are bothersome):  aches, pains  constipation  diarrhea  headache  hot flashes  nausea, vomiting  pain at site where injected  stomach pain This list may not describe all possible side effects. Call your doctor for medical advice about side effects. You may report side effects to FDA at 1-800-FDA-1088. Where should I keep my medicine? This drug is given in a hospital or clinic and will not be stored at home. NOTE: This sheet is a summary. It may not cover all possible information. If you have questions about this medicine, talk to your doctor, pharmacist, or health care provider.  2020 Elsevier/Gold Standard (2018-02-19 11:34:41)  

## 2020-12-09 ENCOUNTER — Encounter (HOSPITAL_COMMUNITY): Payer: Self-pay | Admitting: Emergency Medicine

## 2020-12-09 ENCOUNTER — Other Ambulatory Visit: Payer: Self-pay

## 2020-12-09 ENCOUNTER — Inpatient Hospital Stay (HOSPITAL_COMMUNITY)
Admission: EM | Admit: 2020-12-09 | Discharge: 2020-12-19 | DRG: 335 | Disposition: A | Discharge status: Hospice | Payer: Medicare Other | Attending: Student | Admitting: Student

## 2020-12-09 DIAGNOSIS — C7951 Secondary malignant neoplasm of bone: Secondary | ICD-10-CM | POA: Diagnosis present

## 2020-12-09 DIAGNOSIS — R531 Weakness: Secondary | ICD-10-CM

## 2020-12-09 DIAGNOSIS — Z87891 Personal history of nicotine dependence: Secondary | ICD-10-CM

## 2020-12-09 DIAGNOSIS — I252 Old myocardial infarction: Secondary | ICD-10-CM

## 2020-12-09 DIAGNOSIS — Z66 Do not resuscitate: Secondary | ICD-10-CM | POA: Diagnosis present

## 2020-12-09 DIAGNOSIS — R1030 Lower abdominal pain, unspecified: Secondary | ICD-10-CM | POA: Diagnosis not present

## 2020-12-09 DIAGNOSIS — R34 Anuria and oliguria: Secondary | ICD-10-CM | POA: Diagnosis present

## 2020-12-09 DIAGNOSIS — K5900 Constipation, unspecified: Secondary | ICD-10-CM | POA: Diagnosis not present

## 2020-12-09 DIAGNOSIS — K562 Volvulus: Secondary | ICD-10-CM | POA: Diagnosis not present

## 2020-12-09 DIAGNOSIS — Z20822 Contact with and (suspected) exposure to covid-19: Secondary | ICD-10-CM | POA: Diagnosis not present

## 2020-12-09 DIAGNOSIS — B964 Proteus (mirabilis) (morganii) as the cause of diseases classified elsewhere: Secondary | ICD-10-CM | POA: Diagnosis present

## 2020-12-09 DIAGNOSIS — R9431 Abnormal electrocardiogram [ECG] [EKG]: Secondary | ICD-10-CM | POA: Diagnosis present

## 2020-12-09 DIAGNOSIS — K660 Peritoneal adhesions (postprocedural) (postinfection): Secondary | ICD-10-CM | POA: Diagnosis not present

## 2020-12-09 DIAGNOSIS — K460 Unspecified abdominal hernia with obstruction, without gangrene: Secondary | ICD-10-CM | POA: Diagnosis not present

## 2020-12-09 DIAGNOSIS — E872 Acidosis: Secondary | ICD-10-CM | POA: Diagnosis present

## 2020-12-09 DIAGNOSIS — I10 Essential (primary) hypertension: Secondary | ICD-10-CM | POA: Diagnosis not present

## 2020-12-09 DIAGNOSIS — K56609 Unspecified intestinal obstruction, unspecified as to partial versus complete obstruction: Secondary | ICD-10-CM | POA: Diagnosis not present

## 2020-12-09 DIAGNOSIS — I959 Hypotension, unspecified: Secondary | ICD-10-CM | POA: Diagnosis not present

## 2020-12-09 DIAGNOSIS — B9620 Unspecified Escherichia coli [E. coli] as the cause of diseases classified elsewhere: Secondary | ICD-10-CM | POA: Diagnosis present

## 2020-12-09 DIAGNOSIS — Z515 Encounter for palliative care: Secondary | ICD-10-CM

## 2020-12-09 DIAGNOSIS — C50919 Malignant neoplasm of unspecified site of unspecified female breast: Secondary | ICD-10-CM | POA: Diagnosis present

## 2020-12-09 DIAGNOSIS — C787 Secondary malignant neoplasm of liver and intrahepatic bile duct: Secondary | ICD-10-CM | POA: Diagnosis present

## 2020-12-09 DIAGNOSIS — Z7189 Other specified counseling: Secondary | ICD-10-CM

## 2020-12-09 DIAGNOSIS — Z8249 Family history of ischemic heart disease and other diseases of the circulatory system: Secondary | ICD-10-CM

## 2020-12-09 DIAGNOSIS — R188 Other ascites: Secondary | ICD-10-CM | POA: Diagnosis present

## 2020-12-09 DIAGNOSIS — E876 Hypokalemia: Secondary | ICD-10-CM | POA: Diagnosis present

## 2020-12-09 DIAGNOSIS — E0590 Thyrotoxicosis, unspecified without thyrotoxic crisis or storm: Secondary | ICD-10-CM | POA: Diagnosis present

## 2020-12-09 DIAGNOSIS — E785 Hyperlipidemia, unspecified: Secondary | ICD-10-CM | POA: Diagnosis present

## 2020-12-09 DIAGNOSIS — Z9861 Coronary angioplasty status: Secondary | ICD-10-CM

## 2020-12-09 DIAGNOSIS — I129 Hypertensive chronic kidney disease with stage 1 through stage 4 chronic kidney disease, or unspecified chronic kidney disease: Secondary | ICD-10-CM | POA: Diagnosis present

## 2020-12-09 DIAGNOSIS — I739 Peripheral vascular disease, unspecified: Secondary | ICD-10-CM | POA: Diagnosis present

## 2020-12-09 DIAGNOSIS — Z905 Acquired absence of kidney: Secondary | ICD-10-CM

## 2020-12-09 DIAGNOSIS — C50912 Malignant neoplasm of unspecified site of left female breast: Secondary | ICD-10-CM | POA: Diagnosis present

## 2020-12-09 DIAGNOSIS — R112 Nausea with vomiting, unspecified: Secondary | ICD-10-CM | POA: Diagnosis not present

## 2020-12-09 DIAGNOSIS — Z955 Presence of coronary angioplasty implant and graft: Secondary | ICD-10-CM

## 2020-12-09 DIAGNOSIS — R109 Unspecified abdominal pain: Secondary | ICD-10-CM

## 2020-12-09 DIAGNOSIS — N179 Acute kidney failure, unspecified: Secondary | ICD-10-CM | POA: Diagnosis present

## 2020-12-09 DIAGNOSIS — R1032 Left lower quadrant pain: Secondary | ICD-10-CM | POA: Diagnosis not present

## 2020-12-09 DIAGNOSIS — Z923 Personal history of irradiation: Secondary | ICD-10-CM

## 2020-12-09 DIAGNOSIS — Z9012 Acquired absence of left breast and nipple: Secondary | ICD-10-CM

## 2020-12-09 DIAGNOSIS — R110 Nausea: Secondary | ICD-10-CM | POA: Diagnosis not present

## 2020-12-09 DIAGNOSIS — E162 Hypoglycemia, unspecified: Secondary | ICD-10-CM | POA: Diagnosis not present

## 2020-12-09 DIAGNOSIS — N183 Chronic kidney disease, stage 3 unspecified: Secondary | ICD-10-CM | POA: Diagnosis present

## 2020-12-09 DIAGNOSIS — R1111 Vomiting without nausea: Secondary | ICD-10-CM | POA: Diagnosis not present

## 2020-12-09 DIAGNOSIS — M1990 Unspecified osteoarthritis, unspecified site: Secondary | ICD-10-CM | POA: Diagnosis present

## 2020-12-09 DIAGNOSIS — C7800 Secondary malignant neoplasm of unspecified lung: Secondary | ICD-10-CM | POA: Diagnosis not present

## 2020-12-09 DIAGNOSIS — R0600 Dyspnea, unspecified: Secondary | ICD-10-CM

## 2020-12-09 DIAGNOSIS — R7989 Other specified abnormal findings of blood chemistry: Secondary | ICD-10-CM | POA: Diagnosis present

## 2020-12-09 DIAGNOSIS — I2510 Atherosclerotic heart disease of native coronary artery without angina pectoris: Secondary | ICD-10-CM | POA: Diagnosis present

## 2020-12-09 DIAGNOSIS — N390 Urinary tract infection, site not specified: Secondary | ICD-10-CM | POA: Diagnosis present

## 2020-12-09 DIAGNOSIS — Z79899 Other long term (current) drug therapy: Secondary | ICD-10-CM

## 2020-12-09 DIAGNOSIS — N1832 Chronic kidney disease, stage 3b: Secondary | ICD-10-CM | POA: Diagnosis present

## 2020-12-09 DIAGNOSIS — Z90710 Acquired absence of both cervix and uterus: Secondary | ICD-10-CM

## 2020-12-09 LAB — LIPASE, BLOOD: Lipase: 19 U/L (ref 11–51)

## 2020-12-09 LAB — COMPREHENSIVE METABOLIC PANEL
ALT: 28 U/L (ref 0–44)
AST: 55 U/L — ABNORMAL HIGH (ref 15–41)
Albumin: 4.2 g/dL (ref 3.5–5.0)
Alkaline Phosphatase: 129 U/L — ABNORMAL HIGH (ref 38–126)
Anion gap: 14 (ref 5–15)
BUN: 31 mg/dL — ABNORMAL HIGH (ref 8–23)
CO2: 30 mmol/L (ref 22–32)
Calcium: 10.2 mg/dL (ref 8.9–10.3)
Chloride: 91 mmol/L — ABNORMAL LOW (ref 98–111)
Creatinine, Ser: 1.64 mg/dL — ABNORMAL HIGH (ref 0.44–1.00)
GFR, Estimated: 32 mL/min — ABNORMAL LOW (ref >60)
Glucose, Bld: 139 mg/dL — ABNORMAL HIGH (ref 70–99)
Potassium: 2.9 mmol/L — ABNORMAL LOW (ref 3.5–5.1)
Sodium: 135 mmol/L (ref 135–145)
Total Bilirubin: 1.4 mg/dL — ABNORMAL HIGH (ref 0.3–1.2)
Total Protein: 7.4 g/dL (ref 6.5–8.1)

## 2020-12-09 LAB — CBC
HCT: 41.9 % (ref 36.0–46.0)
Hemoglobin: 14.2 g/dL (ref 12.0–15.0)
MCH: 32.1 pg (ref 26.0–34.0)
MCHC: 33.9 g/dL (ref 30.0–36.0)
MCV: 94.6 fL (ref 80.0–100.0)
Platelets: 225 10*3/uL (ref 150–400)
RBC: 4.43 MIL/uL (ref 3.87–5.11)
RDW: 15.9 % — ABNORMAL HIGH (ref 11.5–15.5)
WBC: 10.4 10*3/uL (ref 4.0–10.5)
nRBC: 0 % (ref 0.0–0.2)

## 2020-12-09 MED ORDER — SODIUM CHLORIDE 0.9 % IV BOLUS (SEPSIS)
1000.0000 mL | Freq: Once | INTRAVENOUS | Status: AC
  Administered 2020-12-10: 1000 mL via INTRAVENOUS

## 2020-12-09 MED ORDER — SODIUM CHLORIDE 0.9 % IV SOLN: 1000.0000 mL | INTRAVENOUS | Status: DC

## 2020-12-09 MED ORDER — ONDANSETRON HCL 4 MG/2ML IJ SOLN
4.0000 mg | Freq: Once | INTRAMUSCULAR | Status: AC
  Administered 2020-12-10: 4 mg via INTRAVENOUS
  Filled 2020-12-09: qty 2

## 2020-12-09 NOTE — ED Notes (Signed)
Pt has been brought back to treatment area via wheelchair with family.

## 2020-12-09 NOTE — ED Triage Notes (Signed)
Patient reports LLQ abdominal pain with foul smelling odor for "days and days." Also reports constipation x3 days.

## 2020-12-09 NOTE — ED Provider Notes (Signed)
[facility] DEPT Provider Note  CSN: [id number] Arrival date & time: 12/09/20 2056  Chief Complaint(s) Abdominal Pain  HPI Wendy [name] is a 80 y.o. female with a past medical history listed below including metastatic breast cancer with mets to the lungs, bones, liver. She presents to the emergency department 2 days of gradually worsening lower abdominal pain with associated nausea and nonbloody nonbilious emesis. She has had decreased oral tolerance. Patient is currently undergoing supportive chemotherapy with Piqray. She denies any fevers or chills. No coughing or congestion. No chest pain or shortness of breath. No urinary symptoms. She does report being constipated.  HPI  Past Medical History Past Medical History:  Diagnosis Date  . Arthritis   . Breast cancer dx'd 2014   left breast  . CAD (coronary artery disease)   . Hyperlipidemia   . Hypertension   . Metastasis ([place]) dx'd 12/21/17   lung, bones and liver  . Myocardial infarction ([place]) 11/22/04  . Peripheral vascular disease ([place])    atherosclerosis carotid artery-mild   Patient Active Problem List   Diagnosis Date Noted  . Hypotension due to hypovolemia   . Acute blood loss anemia   . Elevated troponin   . Demand ischemia ([place])   . Gastroesophageal reflux disease   . Metastatic breast cancer ([place])   . Hypokalemia   . Symptomatic anemia 08/16/2019  . Thrombocytopenia ([place]) 12/01/2018  . Liver metastases ([place]) 11/02/2018  . Acute diastolic CHF (congestive heart failure) ([place]) 09/01/2018  . Pleural effusion 09/01/2018  . Bone metastases ([place]) 05/27/2018  . Metastatic cancer to lung ([place]) 12/22/2017  . Streptococcal bacteremia 09/24/2017  . CKD (chronic kidney disease), stage III ([place]) 09/22/2017  . Sepsis ([place]) 09/22/2017  . Lower urinary tract infectious disease 09/22/2017  . Chest pain 09/22/2017  . Left arm cellulitis 09/22/2017  . Obesity (BMI 30-39.9) 01/06/2014  .  Hyperlipidemia 01/06/2014  . CAD S/P percutaneous coronary angioplasty   . Essential hypertension   . Carotid artery disease ([place])   . Malignant neoplasm of upper-outer quadrant of left breast in female, estrogen receptor positive ([place]) 10/29/2013   Home Medication(s) Prior to Admission medications   Medication Sig Start Date End Date Taking? Authorizing Provider  acetaminophen (TYLENOL) 500 MG tablet Take 1,000 mg by mouth every 6 (six) hours as needed for mild pain.    [provider]  alpelisib (PIQRAY 200MG  DAILY DOSE) 200 MG Therapy Pack Take one 200mg  tablet with food at the same time daily. Swallow whole, do not crush, chew, or split. Patient taking differently: Take 200 mg by mouth every morning. Take one 200mg  tablet with food at the same time daily. Swallow whole, do not crush, chew, or split. 08/29/20   [name] Lose, MD  atorvastatin (LIPITOR) 20 MG tablet TAKE 1 TABLET BY MOUTH EVERY NIGHT AT BEDTIME Patient taking differently: Take 20 mg by mouth at bedtime. 05/01/20   [name] Lose, MD  Calcium Carb-Cholecalciferol (CALCIUM 600 + D PO) Take 1 tablet by mouth daily.    [provider]  ferrous sulfate (SLOW IRON) 160 (50 Fe) MG TBCR SR tablet Take 1 tablet (160 mg total) by mouth daily. 08/04/19   [name] Lose, MD  hydrochlorothiazide (MICROZIDE) 12.5 MG capsule TAKE 1 CAPSULE(12.5 MG) BY MOUTH DAILY Patient taking differently: Take 12.5 mg by mouth daily. 03/08/20   Lorretta Harp, MD  loratadine-pseudoephedrine (CLARITIN-D 24 HOUR) 10-240 MG 24 hr tablet Take 1 tablet by mouth daily. Patient taking differently:  Take 1 tablet by mouth daily as needed for allergies. 03/06/20   [name] Lose, MD  Multiple Vitamin (MULTIVITAMIN WITH MINERALS) TABS tablet Take 1 tablet by mouth daily.    [provider]  nitroGLYCERIN (NITROSTAT) 0.4 MG SL tablet PLACE 1 TABLET (0.4 MG TOTAL) UNDER THE TONGUE EVERY 5 (FIVE) MINUTES AS NEEDED FOR CHEST PAIN. 09/28/18   [name] Lose, MD  potassium chloride SA (KLOR-CON) 20 MEQ tablet TAKE 1 TABLET BY MOUTH ONCE DAILY Patient taking differently: Take 20 mEq by mouth daily. 08/21/20   [name] Lose, MD                                                                                                                                    Past Surgical History Past Surgical History:  Procedure Laterality Date  . ABDOMINAL HYSTERECTOMY  1982  . CATARACT EXTRACTION W/PHACO Right 11/13/2018   Procedure: CATARACT EXTRACTION PHACO AND INTRAOCULAR LENS PLACEMENT RIGHT EYE;  Surgeon: Baruch Goldmann, MD;  Location: [facility];  Service: Ophthalmology;  Laterality: Right;  right  . ESOPHAGOGASTRODUODENOSCOPY (EGD) WITH PROPOFOL N/A 08/20/2019   Procedure: ESOPHAGOGASTRODUODENOSCOPY (EGD) WITH PROPOFOL;  Surgeon: Doran Stabler, MD;  Location: [facility];  Service: Gastroenterology;  Laterality: N/A;  . GIVENS CAPSULE STUDY N/A 08/18/2019   Procedure: GIVENS CAPSULE STUDY;  Surgeon: Doran Stabler, MD;  Location: [facility];  Service: Gastroenterology;  Laterality: N/A;  . HOT HEMOSTASIS N/A 08/20/2019   Procedure: HOT HEMOSTASIS (ARGON PLASMA COAGULATION/BICAP);  Surgeon: Doran Stabler, MD;  Location: [facility];  Service: Gastroenterology;  Laterality: N/A;  . KNEE ARTHROSCOPY Left    knee  . MASTECTOMY MODIFIED RADICAL Left 11/12/2013   Procedure: MASTECTOMY MODIFIED RADICAL;  Surgeon: Rolm Bookbinder, MD;  Location: [facility];  Service: General;  Laterality: Left;  . 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 PERCUTANEOUS CORONARY STENT INTERVENTION (PCI-S)  2005  . PORTACATH PLACEMENT Right 12/16/2013   Procedure: INSERTION PORT-A-CATH;  Surgeon: Rolm Bookbinder, MD;  Location: [facility];  Service: General;  Laterality: Right;  . TEE WITHOUT CARDIOVERSION N/A 09/26/2017   Procedure: TRANSESOPHAGEAL ECHOCARDIOGRAM (TEE);  Surgeon: Dorothy Spark, MD;  Location: [facility] ENDOSCOPY;  Service: Cardiovascular;   Laterality: N/A;   Family History Family History  Problem Relation Age of Onset  . Emphysema Mother   . Heart attack Father     Social History Social History   Tobacco Use  . Smoking status: Former Smoker    Packs/day: 0.30    Types: Cigarettes    Quit date: 11/09/1983    Years since quitting: 37.1  . Smokeless tobacco: Never Used  Vaping Use  . Vaping Use: Never used  Substance Use Topics  . Alcohol use: Not Currently  . Drug use: No   Allergies Patient has no known allergies.  Review of Systems Review of Systems All other systems are  reviewed and are negative for acute change except as noted in the HPI  Physical Exam Vital Signs  I have reviewed the triage vital signs BP 131/83 (BP Location: Right Arm)   Pulse 73   Temp 98.8 F (37.1 C) (Oral)   Resp 18   Ht 5\' 8"  (1.727 m)   Wt 63.5 kg   SpO2 98%   BMI 21.29 kg/m   Physical Exam Vitals reviewed.  Constitutional:      General: She is not in acute distress.    Appearance: She is well-developed and well-nourished. She is not diaphoretic.  HENT:     Head: Normocephalic and atraumatic.     Right Ear: External ear normal.     Left Ear: External ear normal.     Nose: Nose normal.  Eyes:     General: No scleral icterus.    Extraocular Movements: EOM normal.     Conjunctiva/sclera: Conjunctivae normal.  Neck:     Trachea: Phonation normal.  Cardiovascular:     Rate and Rhythm: Normal rate and regular rhythm.  Pulmonary:     Effort: Pulmonary effort is normal. No respiratory distress.     Breath sounds: No stridor.  Abdominal:     General: There is no distension.     Tenderness: There is abdominal tenderness in the right lower quadrant, periumbilical area, suprapubic area, left upper quadrant and left lower quadrant. There is no guarding or rebound.  Musculoskeletal:        General: No edema. Normal range of motion.     Cervical back: Normal range of motion.  Neurological:     Mental Status: She is  alert and oriented to person, place, and time.  Psychiatric:        Mood and Affect: Mood and affect normal.        Behavior: Behavior normal.     ED Results and Treatments Labs (all labs ordered are listed, but only abnormal results are displayed) Labs Reviewed  COMPREHENSIVE METABOLIC PANEL - Abnormal; Notable for the following components:      Result Value   Potassium 2.9 (*)    Chloride 91 (*)    Glucose, Bld 139 (*)    BUN 31 (*)    Creatinine, Ser 1.64 (*)    AST 55 (*)    Alkaline Phosphatase 129 (*)    Total Bilirubin 1.4 (*)    GFR, Estimated 32 (*)    All other components within normal limits  CBC - Abnormal; Notable for the following components:   RDW 15.9 (*)    All other components within normal limits  SARS CORONAVIRUS 2 (TAT 6-24 HRS)  LIPASE, BLOOD  URINALYSIS, ROUTINE W REFLEX MICROSCOPIC                                                                                                                         EKG  EKG Interpretation  Date/Time:  Sunday December 10 2020 00:32:49 EST Ventricular Rate:  69 PR Interval:    QRS Duration: 114 QT Interval:  498 QTC Calculation: 534 R Axis:   90 Text Interpretation: Sinus rhythm Low voltage, extremity leads Prolonged QT interval Confirmed by Addison Lank [phone]) on 12/10/2020 12:48:28 AM      Radiology CT CHEST ABDOMEN PELVIS W CONTRAST  Result Date: 12/10/2020 CLINICAL DATA:  Bowel obstruction suspected. Left lower quadrant abdominal pain. History of breast cancer. EXAM: CT CHEST, ABDOMEN, AND PELVIS WITH CONTRAST TECHNIQUE: Multidetector CT imaging of the chest, abdomen and pelvis was performed following the standard protocol during bolus administration of intravenous contrast. CONTRAST:  65mL OMNIPAQUE IOHEXOL 300 MG/ML  SOLN COMPARISON:  October 24, 2020. FINDINGS: CT CHEST FINDINGS Cardiovascular: The heart size is stable. There is a trace pericardial effusion. There are atherosclerotic changes of the  coronary arteries. There are atherosclerotic changes of the thoracic aorta without evidence for a dissection or aneurysm. Mediastinum/Nodes: -- No mediastinal lymphadenopathy. -- No hilar lymphadenopathy. -- No axillary lymphadenopathy. -- No supraclavicular lymphadenopathy. --the thyroid gland is not well visualized. -  Unremarkable esophagus. Lungs/Pleura: Chronic changes are again noted of the left lung field, especially within the left upper lobe. There is a chronic left-sided pleural effusion. The trachea is unremarkable. The mediastinum is shifted to the left secondary to left-sided volume loss. Musculoskeletal: There is no definite acute compression fracture. Lytic lesions are again noted throughout the thoracic spine, most notably involving the T11 and T5 vertebral bodies. CT ABDOMEN PELVIS FINDINGS Hepatobiliary: Again noted is a dominant liver lesion in the right hepatic lobe measuring approximately 6.9 x 6.7 cm (previously measuring approximately 5.8 x 6.6 cm. Normal gallbladder.There is no biliary ductal dilation. Pancreas: Normal contours without ductal dilatation. No peripancreatic fluid collection. Spleen: Unremarkable. Adrenals/Urinary Tract: --Adrenal glands: Unremarkable. --Right kidney/ureter: No hydronephrosis or radiopaque kidney stones. --Left kidney/ureter: Left kidney is surgically absent. --Urinary bladder: There is a focus of gas within the urinary bladder. Stomach/Bowel: --Stomach/Duodenum: There is mild gastric wall thickening. --Small bowel: The majority of the small bowel appears to now reside in the left abdomen. --Colon: There is a large amount of stool throughout the rectum. There is a wondering cecum. There is distension of the ascending colon, cecum, and portions of the transverse colon. There is an abrupt transition point in the left upper quadrant where there is twisting of the splenic flexure of the colon. There is haziness of the mesentery in the left upper quadrant.  --Appendix: Normal. Vascular/Lymphatic: Atherosclerotic calcification is present within the non-aneurysmal abdominal aorta, without hemodynamically significant stenosis. --No retroperitoneal lymphadenopathy. --No mesenteric lymphadenopathy. --No pelvic or inguinal lymphadenopathy. Reproductive: Status post hysterectomy. No adnexal mass. Other: No ascites or free air. The abdominal wall is normal. Musculoskeletal. There is an unchanged compression fracture of the L1 vertebral body. IMPRESSION: 1. Findings are consistent with a moderate to high-grade large bowel obstruction. There is twisting of the colon in the left upper quadrant resulting in dilatation of the transverse colon and ascending colon. Findings are concerning for an internal hernia. The appearance of the colon suggest this is a closed loop obstruction. Surgical consultation is recommended. There is no free air or pneumatosis. 2. Mild gastric wall thickening, which may be secondary to underdistention or gastritis. 3. Again noted are findings consistent with metastatic breast cancer. The dominant right hepatic lobe lesion has increased in size since the prior study. 4. Focus of gas within the urinary bladder. Correlation with urinalysis is recommended to help exclude cystitis. 5. Chronic changes of the left  lung field as described above. Aortic Atherosclerosis (ICD10-I70.0). Electronically Signed   By: Constance Holster M.D.   On: 12/10/2020 00:56    Pertinent labs & imaging results that were available during my care of the patient were reviewed by me and considered in my medical decision making (see chart for details).  Medications Ordered in ED Medications  sodium chloride 0.9 % bolus 1,000 mL (1,000 mLs Intravenous New Bag/Given 12/10/20 0003)    Followed by  0.9 %  sodium chloride infusion (has no administration in time range)  ondansetron (ZOFRAN) injection 4 mg (4 mg Intravenous Given 12/10/20 0004)  iohexol (OMNIPAQUE) 300 MG/ML solution  80 mL (80 mLs Intravenous Contrast Given 12/10/20 0017)                                                                                                                                    Procedures .1-3 Lead EKG Interpretation Performed by: Fatima Blank, MD Authorized by: Fatima Blank, MD     Interpretation: normal     ECG rate:  78   ECG rate assessment: normal     Rhythm: sinus rhythm     Ectopy: none     Conduction: normal      (including critical care time)  Medical Decision Making / ED Course I have reviewed the nursing notes for this encounter and the patient's prior records (if available in EHR or on provided paperwork).   Wendy [name] was evaluated in Emergency Department on 12/10/2020 for the symptoms described in the history of present illness. She was evaluated in the context of the global COVID-19 pandemic, which necessitated consideration that the patient might be at risk for infection with the SARS-CoV-2 virus that causes COVID-19. Institutional protocols and algorithms that pertain to the evaluation of patients at risk for COVID-19 are in a state of rapid change based on information released by regulatory bodies including the CDC and federal and state organizations. These policies and algorithms were followed during the patient's care in the ED.    Clinical Course as of 12/10/20 0138  Sat Dec 09, 2020  2310 Several days of abdominal pain with nausea vomiting. Also having constipation. Labs drawn in triage are reassuring without leukocytosis or anemia. Patient has mild hypokalemia. Renal function stable. LFTs stable. No evidence of pancreatitis.  Will obtain CT to rule out bowel obstruction or other serious intra-abdominal inflammatory/infectious process.  Patient will be treated with IV fluids and antiemetics in the meantime. [PC]  Sun Dec 10, 2020  0137 Work-up notable for moderate to high-grade bowel obstruction.  NG tube placed.  IV fluids  ordered. Patient is resting comfortably.  Admitted to hospitalist service for further management [PC]    Clinical Course User Index [PC] Haider Hornaday, Grayce Sessions, MD     Final Clinical Impression(s) / ED Diagnoses Final diagnoses:  N&V (nausea and vomiting)  SBO (small bowel obstruction) ([place])  This chart was dictated using voice recognition software.  Despite best efforts to proofread,  errors can occur which can change the documentation meaning.   Fatima Blank, MD 12/10/20 [phone]

## 2020-12-10 ENCOUNTER — Emergency Department (HOSPITAL_COMMUNITY): Payer: Medicare Other

## 2020-12-10 ENCOUNTER — Encounter (HOSPITAL_COMMUNITY): Payer: Self-pay

## 2020-12-10 DIAGNOSIS — Z7401 Bed confinement status: Secondary | ICD-10-CM | POA: Diagnosis not present

## 2020-12-10 DIAGNOSIS — M2550 Pain in unspecified joint: Secondary | ICD-10-CM | POA: Diagnosis not present

## 2020-12-10 DIAGNOSIS — Z66 Do not resuscitate: Secondary | ICD-10-CM | POA: Diagnosis present

## 2020-12-10 DIAGNOSIS — I2510 Atherosclerotic heart disease of native coronary artery without angina pectoris: Secondary | ICD-10-CM | POA: Diagnosis present

## 2020-12-10 DIAGNOSIS — N179 Acute kidney failure, unspecified: Secondary | ICD-10-CM | POA: Diagnosis present

## 2020-12-10 DIAGNOSIS — I351 Nonrheumatic aortic (valve) insufficiency: Secondary | ICD-10-CM | POA: Diagnosis not present

## 2020-12-10 DIAGNOSIS — R933 Abnormal findings on diagnostic imaging of other parts of digestive tract: Secondary | ICD-10-CM | POA: Diagnosis not present

## 2020-12-10 DIAGNOSIS — K56699 Other intestinal obstruction unspecified as to partial versus complete obstruction: Secondary | ICD-10-CM | POA: Diagnosis not present

## 2020-12-10 DIAGNOSIS — R918 Other nonspecific abnormal finding of lung field: Secondary | ICD-10-CM | POA: Diagnosis not present

## 2020-12-10 DIAGNOSIS — Z9861 Coronary angioplasty status: Secondary | ICD-10-CM

## 2020-12-10 DIAGNOSIS — E0590 Thyrotoxicosis, unspecified without thyrotoxic crisis or storm: Secondary | ICD-10-CM | POA: Diagnosis present

## 2020-12-10 DIAGNOSIS — I959 Hypotension, unspecified: Secondary | ICD-10-CM | POA: Diagnosis not present

## 2020-12-10 DIAGNOSIS — R9431 Abnormal electrocardiogram [ECG] [EKG]: Secondary | ICD-10-CM | POA: Diagnosis not present

## 2020-12-10 DIAGNOSIS — K562 Volvulus: Secondary | ICD-10-CM | POA: Diagnosis present

## 2020-12-10 DIAGNOSIS — N183 Chronic kidney disease, stage 3 unspecified: Secondary | ICD-10-CM | POA: Diagnosis not present

## 2020-12-10 DIAGNOSIS — R0602 Shortness of breath: Secondary | ICD-10-CM | POA: Diagnosis not present

## 2020-12-10 DIAGNOSIS — R188 Other ascites: Secondary | ICD-10-CM | POA: Diagnosis present

## 2020-12-10 DIAGNOSIS — K5669 Other partial intestinal obstruction: Secondary | ICD-10-CM | POA: Diagnosis not present

## 2020-12-10 DIAGNOSIS — K56609 Unspecified intestinal obstruction, unspecified as to partial versus complete obstruction: Secondary | ICD-10-CM | POA: Diagnosis present

## 2020-12-10 DIAGNOSIS — E872 Acidosis: Secondary | ICD-10-CM | POA: Diagnosis present

## 2020-12-10 DIAGNOSIS — K5651 Intestinal adhesions [bands], with partial obstruction: Secondary | ICD-10-CM | POA: Diagnosis not present

## 2020-12-10 DIAGNOSIS — N1832 Chronic kidney disease, stage 3b: Secondary | ICD-10-CM | POA: Diagnosis present

## 2020-12-10 DIAGNOSIS — C50919 Malignant neoplasm of unspecified site of unspecified female breast: Secondary | ICD-10-CM | POA: Diagnosis not present

## 2020-12-10 DIAGNOSIS — K460 Unspecified abdominal hernia with obstruction, without gangrene: Secondary | ICD-10-CM | POA: Diagnosis present

## 2020-12-10 DIAGNOSIS — I5031 Acute diastolic (congestive) heart failure: Secondary | ICD-10-CM | POA: Diagnosis not present

## 2020-12-10 DIAGNOSIS — K5989 Other specified functional intestinal disorders: Secondary | ICD-10-CM | POA: Diagnosis not present

## 2020-12-10 DIAGNOSIS — I10 Essential (primary) hypertension: Secondary | ICD-10-CM | POA: Diagnosis not present

## 2020-12-10 DIAGNOSIS — R1030 Lower abdominal pain, unspecified: Secondary | ICD-10-CM | POA: Diagnosis not present

## 2020-12-10 DIAGNOSIS — E876 Hypokalemia: Secondary | ICD-10-CM | POA: Diagnosis present

## 2020-12-10 DIAGNOSIS — R7989 Other specified abnormal findings of blood chemistry: Secondary | ICD-10-CM | POA: Diagnosis present

## 2020-12-10 DIAGNOSIS — I340 Nonrheumatic mitral (valve) insufficiency: Secondary | ICD-10-CM | POA: Diagnosis not present

## 2020-12-10 DIAGNOSIS — K56691 Other complete intestinal obstruction: Secondary | ICD-10-CM

## 2020-12-10 DIAGNOSIS — R0600 Dyspnea, unspecified: Secondary | ICD-10-CM | POA: Diagnosis not present

## 2020-12-10 DIAGNOSIS — R52 Pain, unspecified: Secondary | ICD-10-CM | POA: Diagnosis not present

## 2020-12-10 DIAGNOSIS — R34 Anuria and oliguria: Secondary | ICD-10-CM | POA: Diagnosis present

## 2020-12-10 DIAGNOSIS — N390 Urinary tract infection, site not specified: Secondary | ICD-10-CM | POA: Diagnosis present

## 2020-12-10 DIAGNOSIS — I129 Hypertensive chronic kidney disease with stage 1 through stage 4 chronic kidney disease, or unspecified chronic kidney disease: Secondary | ICD-10-CM | POA: Diagnosis present

## 2020-12-10 DIAGNOSIS — D696 Thrombocytopenia, unspecified: Secondary | ICD-10-CM | POA: Diagnosis not present

## 2020-12-10 DIAGNOSIS — R1032 Left lower quadrant pain: Secondary | ICD-10-CM | POA: Diagnosis not present

## 2020-12-10 DIAGNOSIS — K6389 Other specified diseases of intestine: Secondary | ICD-10-CM | POA: Diagnosis not present

## 2020-12-10 DIAGNOSIS — Z515 Encounter for palliative care: Secondary | ICD-10-CM | POA: Diagnosis not present

## 2020-12-10 DIAGNOSIS — R0902 Hypoxemia: Secondary | ICD-10-CM | POA: Diagnosis not present

## 2020-12-10 DIAGNOSIS — R531 Weakness: Secondary | ICD-10-CM | POA: Diagnosis not present

## 2020-12-10 DIAGNOSIS — I361 Nonrheumatic tricuspid (valve) insufficiency: Secondary | ICD-10-CM | POA: Diagnosis not present

## 2020-12-10 DIAGNOSIS — C50912 Malignant neoplasm of unspecified site of left female breast: Secondary | ICD-10-CM | POA: Diagnosis not present

## 2020-12-10 DIAGNOSIS — Z7189 Other specified counseling: Secondary | ICD-10-CM | POA: Diagnosis not present

## 2020-12-10 DIAGNOSIS — E785 Hyperlipidemia, unspecified: Secondary | ICD-10-CM | POA: Diagnosis not present

## 2020-12-10 DIAGNOSIS — C7800 Secondary malignant neoplasm of unspecified lung: Secondary | ICD-10-CM | POA: Diagnosis present

## 2020-12-10 DIAGNOSIS — Z20822 Contact with and (suspected) exposure to covid-19: Secondary | ICD-10-CM | POA: Diagnosis present

## 2020-12-10 DIAGNOSIS — K660 Peritoneal adhesions (postprocedural) (postinfection): Secondary | ICD-10-CM | POA: Diagnosis present

## 2020-12-10 DIAGNOSIS — C787 Secondary malignant neoplasm of liver and intrahepatic bile duct: Secondary | ICD-10-CM | POA: Diagnosis present

## 2020-12-10 DIAGNOSIS — E039 Hypothyroidism, unspecified: Secondary | ICD-10-CM | POA: Diagnosis not present

## 2020-12-10 DIAGNOSIS — K3189 Other diseases of stomach and duodenum: Secondary | ICD-10-CM | POA: Diagnosis not present

## 2020-12-10 DIAGNOSIS — N189 Chronic kidney disease, unspecified: Secondary | ICD-10-CM | POA: Diagnosis not present

## 2020-12-10 DIAGNOSIS — C7951 Secondary malignant neoplasm of bone: Secondary | ICD-10-CM | POA: Diagnosis present

## 2020-12-10 DIAGNOSIS — K565 Intestinal adhesions [bands], unspecified as to partial versus complete obstruction: Secondary | ICD-10-CM | POA: Diagnosis not present

## 2020-12-10 DIAGNOSIS — B964 Proteus (mirabilis) (morganii) as the cause of diseases classified elsewhere: Secondary | ICD-10-CM | POA: Diagnosis present

## 2020-12-10 DIAGNOSIS — B9620 Unspecified Escherichia coli [E. coli] as the cause of diseases classified elsewhere: Secondary | ICD-10-CM | POA: Diagnosis present

## 2020-12-10 LAB — COMPREHENSIVE METABOLIC PANEL
ALT: 24 U/L (ref 0–44)
AST: 45 U/L — ABNORMAL HIGH (ref 15–41)
Albumin: 3.4 g/dL — ABNORMAL LOW (ref 3.5–5.0)
Alkaline Phosphatase: 110 U/L (ref 38–126)
Anion gap: 12 (ref 5–15)
BUN: 29 mg/dL — ABNORMAL HIGH (ref 8–23)
CO2: 28 mmol/L (ref 22–32)
Calcium: 8.9 mg/dL (ref 8.9–10.3)
Chloride: 96 mmol/L — ABNORMAL LOW (ref 98–111)
Creatinine, Ser: 1.51 mg/dL — ABNORMAL HIGH (ref 0.44–1.00)
GFR, Estimated: 35 mL/min — ABNORMAL LOW (ref >60)
Glucose, Bld: 116 mg/dL — ABNORMAL HIGH (ref 70–99)
Potassium: 2.7 mmol/L — CL (ref 3.5–5.1)
Sodium: 136 mmol/L (ref 135–145)
Total Bilirubin: 0.7 mg/dL (ref 0.3–1.2)
Total Protein: 6.3 g/dL — ABNORMAL LOW (ref 6.5–8.1)

## 2020-12-10 LAB — CBC
HCT: 37.2 % (ref 36.0–46.0)
Hemoglobin: 12.4 g/dL (ref 12.0–15.0)
MCH: 31.9 pg (ref 26.0–34.0)
MCHC: 33.3 g/dL (ref 30.0–36.0)
MCV: 95.6 fL (ref 80.0–100.0)
Platelets: 198 10*3/uL (ref 150–400)
RBC: 3.89 MIL/uL (ref 3.87–5.11)
RDW: 15.9 % — ABNORMAL HIGH (ref 11.5–15.5)
WBC: 9 10*3/uL (ref 4.0–10.5)
nRBC: 0 % (ref 0.0–0.2)

## 2020-12-10 LAB — MAGNESIUM: Magnesium: 2 mg/dL (ref 1.7–2.4)

## 2020-12-10 LAB — LACTIC ACID, PLASMA: Lactic Acid, Venous: 1.1 mmol/L (ref 0.5–1.9)

## 2020-12-10 LAB — SARS CORONAVIRUS 2 (TAT 6-24 HRS): SARS Coronavirus 2: NEGATIVE

## 2020-12-10 MED ORDER — IOHEXOL 300 MG/ML  SOLN
80.0000 mL | Freq: Once | INTRAMUSCULAR | Status: AC | PRN
  Administered 2020-12-10: 80 mL via INTRAVENOUS

## 2020-12-10 MED ORDER — ONDANSETRON HCL 4 MG/2ML IJ SOLN
4.0000 mg | Freq: Four times a day (QID) | INTRAMUSCULAR | Status: DC | PRN
  Administered 2020-12-13 – 2020-12-14 (×2): 4 mg via INTRAVENOUS
  Filled 2020-12-10 (×3): qty 2

## 2020-12-10 MED ORDER — MAGNESIUM SULFATE IN D5W 1-5 GM/100ML-% IV SOLN
1.0000 g | Freq: Once | INTRAVENOUS | Status: AC
  Administered 2020-12-10: 1 g via INTRAVENOUS
  Filled 2020-12-10: qty 100

## 2020-12-10 MED ORDER — FAMOTIDINE IN NACL 20-0.9 MG/50ML-% IV SOLN
20.0000 mg | Freq: Every day | INTRAVENOUS | Status: DC
  Administered 2020-12-10 – 2020-12-16 (×6): 20 mg via INTRAVENOUS
  Filled 2020-12-10 (×6): qty 50

## 2020-12-10 MED ORDER — MORPHINE SULFATE (PF) 2 MG/ML IV SOLN
1.0000 mg | INTRAVENOUS | Status: DC | PRN
Start: 2020-12-10 — End: 2020-12-15
  Administered 2020-12-11: 1 mg via INTRAVENOUS
  Administered 2020-12-13: 3 mg via INTRAVENOUS
  Administered 2020-12-13 – 2020-12-14 (×2): 2 mg via INTRAVENOUS
  Filled 2020-12-10: qty 2
  Filled 2020-12-10 (×4): qty 1

## 2020-12-10 MED ORDER — POTASSIUM CHLORIDE IN NACL 40-0.9 MEQ/L-% IV SOLN
INTRAVENOUS | Status: AC
  Filled 2020-12-10 (×2): qty 1000

## 2020-12-10 MED ORDER — POTASSIUM CHLORIDE IN NACL 40-0.9 MEQ/L-% IV SOLN
INTRAVENOUS | Status: DC
  Filled 2020-12-10: qty 1000

## 2020-12-10 MED ORDER — BISACODYL 10 MG RE SUPP
10.0000 mg | Freq: Once | RECTAL | Status: AC
  Administered 2020-12-10: 10 mg via RECTAL
  Filled 2020-12-10: qty 1

## 2020-12-10 NOTE — ED Notes (Signed)
Pt's daughter in law hs leftpt at bedside and will return in a few hours. Phone number left.

## 2020-12-10 NOTE — ED Notes (Signed)
Initial contact with pt. Pt alert and oriented x4. Daughter in law at bedside with pt. Pt is on monitor x4. Iv access established. Fluids infusing, will continue to monitor.

## 2020-12-10 NOTE — Progress Notes (Addendum)
Same day note  Patient seen and examined at bedside.  Patient was admitted to the hospital for abdominal pain, nausea vomiting and constipation.  At the time of my evaluation, patient complains of intractable abdominal pain.  No flatus or bowel movement.  Denies nausea at the time of my evaluation.  Physical examination reveals female in moderate distress due to abdominal discomfort.  Abdomen soft on palpation.  Laboratory data and imaging was reviewed  Assessment and Plan.  Bowel obstruction  Patient with abdominal pain nausea vomiting and constipation.  CT scan concerning for closed-loop obstruction.  General surgery on board and there is possibility of primary/metastatic tumor causing obstruction..  Continue n.p.o. IV fluids, and IV hydration with analgesia.  Follow surgical recommendation.  Lactate of 1.1.  No leukocytosis.  Lipase of 19.  General surgery trying Dulcolax suppository.  Patient might need surgical intervention if it improved.  Continue NPO.  Metastatic breast cancer  Follows up with with Dr. [name] as outpatient.  Has history of mass/cyst to liver lymph nodes long and multiple thoracic vertebra status post left mastectomy chemotherapy and adjuvant radiation.- Currently treated with Piqray, Faslodex, and Xgeva. 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 IV fluids.  Hypokalemia  prolonged QT interval Repeat potassium of 2.7.  Initial potassium of 2.9.  Had prolonged QT on arrival.  Continue with IV potassium supplements with IV fluids.  Check BMP closely.  Received magnesium sulfate 1 g in the ED.  Latest magnesium of 2.0.   CAD No chest pain.  EKG with QT prolongation.  I spoke with the patient's daughter-in-law on the phone updated her about the clinical condition of the patient  No Charge  Signed,  Delila Pereyra, MD [facility]

## 2020-12-10 NOTE — ED Notes (Signed)
Patient transported to CT 

## 2020-12-10 NOTE — Consult Note (Signed)
Reason for Consult:large bowel obstruction Referring Physician: Dr. [name] is an 80 y.o. female.  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  She reports she had difficulty moving her bowels for several days and has had to disimpact herself several times.  She had 1 episode of emesis.  She is still being treated with chemotherapy every 12 weeks for her metastatic disease.  She underwent a CAT scan of the abdomen and pelvis which showed an obstruction of the colon at the splenic flexure.  Currently, she reports her abdominal pain is improved.  Her white blood count and lactic acid level are normal.  She is uncertain of the last time she has had a colonoscopy but it has been many years.  She denies fevers.  She denies blood in her stool.  Past Medical History:  Diagnosis Date  . Arthritis   . Breast cancer dx'd 2014   left breast  . CAD (coronary artery disease)   . Hyperlipidemia   . Hypertension   . Metastasis ([place]) dx'd 12/21/17   lung, bones and liver  . Myocardial infarction ([place]) 11/22/04  . Peripheral vascular disease ([place])    atherosclerosis carotid artery-mild    Past Surgical History:  Procedure Laterality Date  . ABDOMINAL HYSTERECTOMY  1982  . CATARACT EXTRACTION W/PHACO Right 11/13/2018   Procedure: CATARACT EXTRACTION PHACO AND INTRAOCULAR LENS PLACEMENT RIGHT EYE;  Surgeon: Baruch Goldmann, MD;  Location: [facility];  Service: Ophthalmology;  Laterality: Right;  right  . ESOPHAGOGASTRODUODENOSCOPY (EGD) WITH PROPOFOL N/A 08/20/2019   Procedure: ESOPHAGOGASTRODUODENOSCOPY (EGD) WITH PROPOFOL;  Surgeon: Doran Stabler, MD;  Location: [facility];  Service: Gastroenterology;  Laterality: N/A;  . GIVENS CAPSULE STUDY N/A 08/18/2019   Procedure: GIVENS CAPSULE STUDY;  Surgeon: Doran Stabler, MD;  Location: [facility];  Service: Gastroenterology;  Laterality: N/A;  . HOT HEMOSTASIS N/A 08/20/2019   Procedure: HOT HEMOSTASIS (ARGON PLASMA COAGULATION/BICAP);  Surgeon: Doran Stabler, MD;  Location: [facility];  Service: Gastroenterology;  Laterality: N/A;  . KNEE ARTHROSCOPY Left    knee  . MASTECTOMY MODIFIED RADICAL Left 11/12/2013   Procedure: MASTECTOMY MODIFIED RADICAL;  Surgeon: Rolm Bookbinder, MD;  Location: [facility];  Service: General;  Laterality: Left;  . 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 PERCUTANEOUS CORONARY STENT INTERVENTION (PCI-S)  2005  . PORTACATH PLACEMENT Right 12/16/2013   Procedure: INSERTION PORT-A-CATH;  Surgeon: Rolm Bookbinder, MD;  Location: [facility];  Service: General;  Laterality: Right;  . TEE WITHOUT CARDIOVERSION N/A 09/26/2017   Procedure: TRANSESOPHAGEAL ECHOCARDIOGRAM (TEE);  Surgeon: Dorothy Spark, MD;  Location: [facility] ENDOSCOPY;  Service: Cardiovascular;  Laterality: N/A;    Family History  Problem Relation Age of Onset  . Emphysema Mother   . Heart attack Father     Social History:  reports that she quit smoking about 37 years ago. Her smoking use included cigarettes. She smoked 0.30 packs per day. She has never used smokeless tobacco. She reports previous alcohol use. She reports that she does not use drugs.  Allergies: No Known Allergies  Medications: I have reviewed the patient's current medications.  Results for orders placed or performed during the hospital encounter of 12/09/20 (from the past 48 hour(s))  Lipase, blood     Status: None  Collection Time: 12/09/20  9:20 PM  Result Value Ref Range   Lipase 19 11 - 51 U/L    Comment: Performed at [facility], [place] [address] [place], [place] [address]  Comprehensive metabolic panel     Status: Abnormal   Collection Time: 12/09/20  9:20 PM  Result Value Ref Range   Sodium 135 135 - 145 mmol/L   Potassium 2.9 (L) 3.5 - 5.1 mmol/L   Chloride 91  (L) 98 - 111 mmol/L   CO2 30 22 - 32 mmol/L   Glucose, Bld 139 (H) 70 - 99 mg/dL    Comment: Glucose reference range applies only to samples taken after fasting for at least 8 hours.   BUN 31 (H) 8 - 23 mg/dL   Creatinine, Ser 1.64 (H) 0.44 - 1.00 mg/dL   Calcium 10.2 8.9 - 10.3 mg/dL   Total Protein 7.4 6.5 - 8.1 g/dL   Albumin 4.2 3.5 - 5.0 g/dL   AST 55 (H) 15 - 41 U/L   ALT 28 0 - 44 U/L   Alkaline Phosphatase 129 (H) 38 - 126 U/L   Total Bilirubin 1.4 (H) 0.3 - 1.2 mg/dL   GFR, Estimated 32 (L) >60 mL/min    Comment: (NOTE) Calculated using the CKD-EPI Creatinine Equation (2021)    Anion gap 14 5 - 15    Comment: Performed at [facility], [place] [address], [place], [place] [address]  CBC     Status: Abnormal   Collection Time: 12/09/20  9:20 PM  Result Value Ref Range   WBC 10.4 4.0 - 10.5 K/uL   RBC 4.43 3.87 - 5.11 MIL/uL   Hemoglobin 14.2 12.0 - 15.0 g/dL   HCT 41.9 36.0 - 46.0 %   MCV 94.6 80.0 - 100.0 fL   MCH 32.1 26.0 - 34.0 pg   MCHC 33.9 30.0 - 36.0 g/dL   RDW 15.9 (H) 11.5 - 15.5 %   Platelets 225 150 - 400 K/uL   nRBC 0.0 0.0 - 0.2 %    Comment: Performed at [facility], [place] [address], [place], [place] [address]  Magnesium     Status: None   Collection Time: 12/10/20  3:26 AM  Result Value Ref Range   Magnesium 2.0 1.7 - 2.4 mg/dL    Comment: Performed at [facility], [place] [address] [place],  [address]  Comprehensive metabolic panel     Status: Abnormal   Collection Time: 12/10/20  3:26 AM  Result Value Ref Range   Sodium 136 135 - 145 mmol/L   Potassium 2.7 (LL) 3.5 - 5.1 mmol/L    Comment: CRITICAL RESULT CALLED TO, READ BACK BY AND VERIFIED WITH: SHARRON DAVIS RN 12/10/2020 @0433  BY P.HENDERSON    Chloride 96 (L) 98 - 111 mmol/L   CO2 28 22 - 32 mmol/L   Glucose, Bld 116 (H) 70 - 99 mg/dL    Comment: Glucose reference range applies only to samples taken after fasting for at least 8  hours.   BUN 29 (H) 8 - 23 mg/dL   Creatinine, Ser 1.51 (H) 0.44 - 1.00 mg/dL   Calcium 8.9 8.9 - 10.3 mg/dL   Total Protein 6.3 (L) 6.5 - 8.1 g/dL   Albumin 3.4 (L) 3.5 - 5.0 g/dL   AST 45 (H) 15 - 41 U/L   ALT 24 0 - 44 U/L   Alkaline Phosphatase 110 38 - 126 U/L   Total Bilirubin  0.7 0.3 - 1.2 mg/dL   GFR, Estimated 35 (L) >60 mL/min    Comment: (NOTE) Calculated using the CKD-EPI Creatinine Equation (2021)    Anion gap 12 5 - 15    Comment: Performed at [facility], [place] [address] [place], [place] [address]  CBC     Status: Abnormal   Collection Time: 12/10/20  3:26 AM  Result Value Ref Range   WBC 9.0 4.0 - 10.5 K/uL   RBC 3.89 3.87 - 5.11 MIL/uL   Hemoglobin 12.4 12.0 - 15.0 g/dL   HCT 37.2 36.0 - 46.0 %   MCV 95.6 80.0 - 100.0 fL   MCH 31.9 26.0 - 34.0 pg   MCHC 33.3 30.0 - 36.0 g/dL   RDW 15.9 (H) 11.5 - 15.5 %   Platelets 198 150 - 400 K/uL   nRBC 0.0 0.0 - 0.2 %    Comment: Performed at [facility], [place] [address], [place], [place] [address]  Lactic acid, plasma     Status: None   Collection Time: 12/10/20  3:26 AM  Result Value Ref Range   Lactic Acid, Venous 1.1 0.5 - 1.9 mmol/L    Comment: Performed at [facility], [place] [address], [place], [place] [address]    CT CHEST ABDOMEN PELVIS W CONTRAST  Result Date: 12/10/2020 CLINICAL DATA:  Bowel obstruction suspected. Left lower quadrant abdominal pain. History of breast cancer. EXAM: CT CHEST, ABDOMEN, AND PELVIS WITH CONTRAST TECHNIQUE: Multidetector CT imaging of the chest, abdomen and pelvis was performed following the standard protocol during bolus administration of intravenous contrast. CONTRAST:  75mL OMNIPAQUE IOHEXOL 300 MG/ML  SOLN COMPARISON:  October 24, 2020. FINDINGS: CT CHEST FINDINGS Cardiovascular: The heart size is stable. There is a trace pericardial effusion. There are atherosclerotic changes of the coronary arteries. There are  atherosclerotic changes of the thoracic aorta without evidence for a dissection or aneurysm. Mediastinum/Nodes: -- No mediastinal lymphadenopathy. -- No hilar lymphadenopathy. -- No axillary lymphadenopathy. -- No supraclavicular lymphadenopathy. --the thyroid gland is not well visualized. -  Unremarkable esophagus. Lungs/Pleura: Chronic changes are again noted of the left lung field, especially within the left upper lobe. There is a chronic left-sided pleural effusion. The trachea is unremarkable. The mediastinum is shifted to the left secondary to left-sided volume loss. Musculoskeletal: There is no definite acute compression fracture. Lytic lesions are again noted throughout the thoracic spine, most notably involving the T11 and T5 vertebral bodies. CT ABDOMEN PELVIS FINDINGS Hepatobiliary: Again noted is a dominant liver lesion in the right hepatic lobe measuring approximately 6.9 x 6.7 cm (previously measuring approximately 5.8 x 6.6 cm. Normal gallbladder.There is no biliary ductal dilation. Pancreas: Normal contours without ductal dilatation. No peripancreatic fluid collection. Spleen: Unremarkable. Adrenals/Urinary Tract: --Adrenal glands: Unremarkable. --Right kidney/ureter: No hydronephrosis or radiopaque kidney stones. --Left kidney/ureter: Left kidney is surgically absent. --Urinary bladder: There is a focus of gas within the urinary bladder. Stomach/Bowel: --Stomach/Duodenum: There is mild gastric wall thickening. --Small bowel: The majority of the small bowel appears to now reside in the left abdomen. --Colon: There is a large amount of stool throughout the rectum. There is a wondering cecum. There is distension of the ascending colon, cecum, and portions of the transverse colon. There is an abrupt transition point in the left upper quadrant where there is twisting of the splenic flexure of the colon. There is haziness of the mesentery in the left upper quadrant. --Appendix: Normal. Vascular/Lymphatic:  Atherosclerotic calcification  is present within the non-aneurysmal abdominal aorta, without hemodynamically significant stenosis. --No retroperitoneal lymphadenopathy. --No mesenteric lymphadenopathy. --No pelvic or inguinal lymphadenopathy. Reproductive: Status post hysterectomy. No adnexal mass. Other: No ascites or free air. The abdominal wall is normal. Musculoskeletal. There is an unchanged compression fracture of the L1 vertebral body. IMPRESSION: 1. Findings are consistent with a moderate to high-grade large bowel obstruction. There is twisting of the colon in the left upper quadrant resulting in dilatation of the transverse colon and ascending colon. Findings are concerning for an internal hernia. The appearance of the colon suggest this is a closed loop obstruction. Surgical consultation is recommended. There is no free air or pneumatosis. 2. Mild gastric wall thickening, which may be secondary to underdistention or gastritis. 3. Again noted are findings consistent with metastatic breast cancer. The dominant right hepatic lobe lesion has increased in size since the prior study. 4. Focus of gas within the urinary bladder. Correlation with urinalysis is recommended to help exclude cystitis. 5. Chronic changes of the left lung field as described above. Aortic Atherosclerosis (ICD10-I70.0). Electronically Signed   By: Constance Holster M.D.   On: 12/10/2020 00:56    Review of Systems  Constitutional: Positive for fatigue. Negative for chills and fever.  Respiratory: Negative for cough and shortness of breath.   Gastrointestinal: Positive for abdominal pain, constipation, nausea and vomiting. Negative for blood in stool.  Genitourinary: Negative for dysuria.   Blood pressure (!) 115/56, pulse 61, temperature 98.8 F (37.1 C), temperature source Oral, resp. rate 15, height 5\' 8"  (1.727 m), weight 63.5 kg, SpO2 98 %. Physical Exam Constitutional:      General: She is not in acute distress.     Appearance: She is well-developed. She is not ill-appearing or diaphoretic.  HENT:     Head: Normocephalic and atraumatic.  Eyes:     Extraocular Movements: Extraocular movements intact.     Pupils: Pupils are equal, round, and reactive to light.  Cardiovascular:     Rate and Rhythm: Normal rate and regular rhythm.     Heart sounds: No murmur heard.   Pulmonary:     Effort: Pulmonary effort is normal. No respiratory distress.     Breath sounds: Normal breath sounds. No wheezing.  Abdominal:     Tenderness: There is guarding. There is no rebound.     Hernia: No hernia is present.     Comments: Her abdomen is soft and nondistended.  There is some tenderness and guarding mostly in the left abdomen  Positive hepatomegaly  Musculoskeletal:     Comments: Moves all 4 extremities.  No long bone abnormalities.  No clubbing or cyanosis  Skin:    General: Skin is warm and dry.     Coloration: Skin is not jaundiced.  Neurological:     General: No focal deficit present.     Mental Status: She is alert and oriented to person, place, and time.  Psychiatric:        Mood and Affect: Mood is not anxious.        Behavior: Behavior normal.     Assessment/Plan: Large bowel obstruction  I have reviewed the CAT scan of her abdomen and pelvis.  Currently, I do not believe this is a closed-loop obstruction.  I worry it may be secondary to metastatic disease versus primary mass.  This could be exacerbated by her constipation and large amount of stool in her rectum.  There is no small bowel dilation and she is currently  no longer nauseated and is not throwing up so I believe we can hold on the nasogastric tube unless she develops emesis again. Will try to evacuate some of the stool from the rectum with a suppository and potentially an enema.  I recommend a GI consultation to consider a lower endoscopy.  Should her condition change, she may require more urgent surgery which would necessitate a  colostomy.  I discussed this with her and she agrees with the current plan.    Coralie Keens MD 12/10/2020, 6:30 AM

## 2020-12-10 NOTE — ED Notes (Signed)
Norman Herrlich [phone] (daughter in law)

## 2020-12-10 NOTE — ED Notes (Signed)
surgeon is with pt at bedside and states no NG tube needed.

## 2020-12-10 NOTE — ED Notes (Signed)
CRITICAL LAB:   POTASSIUM: 2.7

## 2020-12-10 NOTE — H&P (Signed)
History and Physical    Wendy [name]:[id number] DOB: 03/18/1941 DOA: 12/09/2020  PCP: [name], [name]   Patient coming from: Home   Chief Complaint: Abdominal pain, N/V, constipation   HPI: Wendy [name] is a 80 y.o. 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  Patient reports longstanding constipation, has been manually disimpacting herself, cannot remember when she last had a normal bowel movement, but developed left-sided abdominal pain that has become severe over the past 3 days.  This has been accompanied by nausea with nonbloody vomiting.  She denies any fevers, chills, chest pain, cough, or lightheadedness.  ED Course: Upon arrival to the ED, patient is found to be afebrile, saturating well on room air, and with stable blood pressure.  EKG features sinus rhythm with QTc interval of 534 ms.  Chemistry panel is notable for potassium 2.9 and creatinine 1.64.  CBC is unremarkable.  Lipase was normal.  CT of the abdomen and pelvis is concerning for moderate to high-grade bowel obstruction with twisting of the colon in the left upper quadrant and findings concerning for internal hernia and closed-loop obstruction without free air or pneumatosis.  Also noted on CT is increase in the dominant right hepatic lobe lesion and focus of gas in the urinary bladder.  COVID-19 screening test not yet resulted.  Review of Systems:  All other systems reviewed and apart from HPI, are negative.  Past Medical History:  Diagnosis Date  . Arthritis   . Breast cancer dx'd 2014   left breast  . CAD (coronary artery disease)   . Hyperlipidemia   . Hypertension   . Metastasis ([place]) dx'd 12/21/17   lung, bones and liver  . Myocardial infarction ([place])  11/22/04  . Peripheral vascular disease ([place])    atherosclerosis carotid artery-mild    Past Surgical History:  Procedure Laterality Date  . ABDOMINAL HYSTERECTOMY  1982  . CATARACT EXTRACTION W/PHACO Right 11/13/2018   Procedure: CATARACT EXTRACTION PHACO AND INTRAOCULAR LENS PLACEMENT RIGHT EYE;  Surgeon: Baruch Goldmann, MD;  Location: [facility];  Service: Ophthalmology;  Laterality: Right;  right  . ESOPHAGOGASTRODUODENOSCOPY (EGD) WITH PROPOFOL N/A 08/20/2019   Procedure: ESOPHAGOGASTRODUODENOSCOPY (EGD) WITH PROPOFOL;  Surgeon: Doran Stabler, MD;  Location: [facility];  Service: Gastroenterology;  Laterality: N/A;  . GIVENS CAPSULE STUDY N/A 08/18/2019   Procedure: GIVENS CAPSULE STUDY;  Surgeon: Doran Stabler, MD;  Location: [facility];  Service: Gastroenterology;  Laterality: N/A;  . HOT HEMOSTASIS N/A 08/20/2019   Procedure: HOT HEMOSTASIS (ARGON PLASMA COAGULATION/BICAP);  Surgeon: Doran Stabler, MD;  Location: [facility];  Service: Gastroenterology;  Laterality: N/A;  . KNEE ARTHROSCOPY Left    knee  . MASTECTOMY MODIFIED RADICAL Left 11/12/2013   Procedure: MASTECTOMY MODIFIED RADICAL;  Surgeon: Rolm Bookbinder, MD;  Location: [facility];  Service: General;  Laterality: Left;  . 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 PERCUTANEOUS CORONARY STENT INTERVENTION (PCI-S)  2005  . PORTACATH PLACEMENT Right 12/16/2013   Procedure: INSERTION PORT-A-CATH;  Surgeon: Rolm Bookbinder, MD;  Location: [facility];  Service: General;  Laterality: Right;  . TEE WITHOUT CARDIOVERSION N/A 09/26/2017   Procedure: TRANSESOPHAGEAL ECHOCARDIOGRAM (TEE);  Surgeon: Dorothy Spark, MD;  Location: [facility];  Service: Cardiovascular;  Laterality: N/A;    Social History:   reports that she quit smoking about 37 years ago. Her smoking use included cigarettes. She smoked 0.30 packs per day. She has never used smokeless tobacco. She reports previous alcohol use. She  reports that she does not use drugs.  No Known Allergies  Family History  Problem Relation Age of Onset  . Emphysema Mother   . Heart attack Father      Prior to Admission medications   Medication Sig Start Date End Date Taking? Authorizing Provider  acetaminophen (TYLENOL) 500 MG tablet Take 1,000 mg by mouth every 6 (six) hours as needed for mild pain.   Yes [provider]  alpelisib (PIQRAY 200MG  DAILY DOSE) 200 MG Therapy Pack Take one 200mg  tablet with food at the same time daily. Swallow whole, do not crush, chew, or split. Patient taking differently: Take 200 mg by mouth every morning. Take one 200mg  tablet with food at the same time daily. Swallow whole, do not crush, chew, or split. 08/29/20  Yes [name] Lose, MD  Calcium Carb-Cholecalciferol (CALCIUM 600 + D PO) Take 1 tablet by mouth daily.   Yes [provider]  ferrous sulfate (SLOW IRON) 160 (50 Fe) MG TBCR SR tablet Take 1 tablet (160 mg total) by mouth daily. 08/04/19  Yes [name] Lose, MD  hydrochlorothiazide (MICROZIDE) 12.5 MG capsule TAKE 1 CAPSULE(12.5 MG) BY MOUTH DAILY Patient taking differently: Take 12.5 mg by mouth daily. 03/08/20  Yes Lorretta Harp, MD  Multiple Vitamin (MULTIVITAMIN WITH MINERALS) TABS tablet Take 1 tablet by mouth daily.   Yes [provider]  nitroGLYCERIN (NITROSTAT) 0.4 MG SL tablet PLACE 1 TABLET (0.4 MG TOTAL) UNDER THE TONGUE EVERY 5 (FIVE) MINUTES AS NEEDED FOR CHEST PAIN. 09/28/18  Yes [name] Lose, MD  atorvastatin (LIPITOR) 20 MG tablet TAKE 1 TABLET BY MOUTH EVERY NIGHT AT BEDTIME Patient not taking: Reported on 12/10/2020 05/01/20   [name] Lose, MD  loratadine-pseudoephedrine (CLARITIN-D 24 HOUR) 10-240 MG 24 hr tablet Take 1 tablet by mouth daily. Patient not taking: Reported on 12/10/2020 03/06/20   [name] Lose, MD  potassium chloride SA (KLOR-CON) 20 MEQ tablet TAKE 1 TABLET BY MOUTH ONCE DAILY Patient not taking: No sig reported 08/21/20   [name] Lose, MD    Physical Exam: Vitals:   12/10/20 0128 12/10/20 0129 12/10/20 0130 12/10/20 0132  BP:    (!) 125/55  Pulse: 64 64 64 65  Resp: 17 17 14 19   Temp:      TempSrc:      SpO2: 97% 98% 97% 96%  Weight:      Height:        Constitutional: NAD, calm  Eyes: PERTLA, lids and conjunctivae normal ENMT: Mucous membranes are moist. Posterior pharynx clear of any exudate or lesions.   Neck: normal, supple, no masses, no thyromegaly Respiratory: no wheezing, no crackles. No accessory muscle use.  Cardiovascular: S1 & S2 heard, regular rate and rhythm. No extremity edema.   Abdomen:  Soft, not guarding, exquisitely tender on left only. Occasional high-pitched bowel sound.  Musculoskeletal: no clubbing / cyanosis. No joint deformity upper and lower extremities.   Skin: no significant rashes, lesions, ulcers. Warm, dry, well-perfused. Neurologic: CN 2-12 grossly intact. Sensation intact. Moving all extremities.  Psychiatric: Alert and oriented to person, place, and situation. Very pleasant and cooperative.    Labs and Imaging on Admission: I have personally reviewed following labs and imaging studies  CBC: Recent Labs  Lab 12/09/20 2120  WBC 10.4  HGB 14.2  HCT 41.9  MCV 94.6  PLT 161   Basic Metabolic Panel: Recent Labs  Lab 12/09/20 2120  NA 135  K 2.9*  CL 91*  CO2 30  GLUCOSE 139*  BUN 31*  CREATININE 1.64*  CALCIUM 10.2   GFR: Estimated Creatinine Clearance: 27.9 mL/min (A) (by C-G formula based on SCr of 1.64 mg/dL (H)). Liver Function Tests: Recent Labs  Lab 12/09/20 2120  AST 55*  ALT 28  ALKPHOS 129*  BILITOT 1.4*  PROT 7.4  ALBUMIN 4.2   Recent Labs  Lab 12/09/20 2120  LIPASE 19   No results for input(s): AMMONIA in the last 168 hours. Coagulation Profile: No results for input(s): INR, PROTIME in the last 168 hours. Cardiac Enzymes: No results for input(s): CKTOTAL, CKMB, CKMBINDEX, TROPONINI in the last 168 hours. BNP (last 3  results) No results for input(s): PROBNP in the last 8760 hours. HbA1C: No results for input(s): HGBA1C in the last 72 hours. CBG: No results for input(s): GLUCAP in the last 168 hours. Lipid Profile: No results for input(s): CHOL, HDL, LDLCALC, TRIG, CHOLHDL, LDLDIRECT in the last 72 hours. Thyroid Function Tests: No results for input(s): TSH, T4TOTAL, FREET4, T3FREE, THYROIDAB in the last 72 hours. Anemia Panel: No results for input(s): VITAMINB12, FOLATE, FERRITIN, TIBC, IRON, RETICCTPCT in the last 72 hours. Urine analysis:    Component Value Date/Time   COLORURINE YELLOW 08/16/2019 0040   APPEARANCEUR CLEAR 08/16/2019 0040   LABSPEC 1.015 08/16/2019 0040   PHURINE 5.0 08/16/2019 0040   GLUCOSEU NEGATIVE 08/16/2019 0040   HGBUR NEGATIVE 08/16/2019 0040   BILIRUBINUR NEGATIVE 08/16/2019 0040   KETONESUR NEGATIVE 08/16/2019 0040   PROTEINUR NEGATIVE 08/16/2019 0040   NITRITE NEGATIVE 08/16/2019 0040   LEUKOCYTESUR SMALL (A) 08/16/2019 0040   Sepsis Labs: @LABRCNTIP (procalcitonin:4,lacticidven:4) )No results found for this or any previous visit (from the past 240 hour(s)).   Radiological Exams on Admission: CT CHEST ABDOMEN PELVIS W CONTRAST  Result Date: 12/10/2020 CLINICAL DATA:  Bowel obstruction suspected. Left lower quadrant abdominal pain. History of breast cancer. EXAM: CT CHEST, ABDOMEN, AND PELVIS WITH CONTRAST TECHNIQUE: Multidetector CT imaging of the chest, abdomen and pelvis was performed following the standard protocol during bolus administration of intravenous contrast. CONTRAST:  68mL OMNIPAQUE IOHEXOL 300 MG/ML  SOLN COMPARISON:  October 24, 2020. FINDINGS: CT CHEST FINDINGS Cardiovascular: The heart size is stable. There is a trace pericardial effusion. There are atherosclerotic changes of the coronary arteries. There are atherosclerotic changes of the thoracic aorta without evidence for a dissection or aneurysm. Mediastinum/Nodes: -- No mediastinal  lymphadenopathy. -- No hilar lymphadenopathy. -- No axillary lymphadenopathy. -- No supraclavicular lymphadenopathy. --the thyroid gland is not well visualized. -  Unremarkable esophagus. Lungs/Pleura: Chronic changes are again noted of the left lung field, especially within the left upper lobe. There is a chronic left-sided pleural effusion. The trachea is unremarkable. The mediastinum is shifted to the left secondary to left-sided volume loss. Musculoskeletal: There is no definite acute compression fracture. Lytic lesions are again noted throughout the thoracic spine, most notably involving the T11 and T5 vertebral bodies. CT ABDOMEN PELVIS FINDINGS Hepatobiliary: Again noted is a dominant liver lesion in the right hepatic lobe measuring approximately 6.9 x 6.7 cm (previously measuring approximately 5.8 x 6.6 cm. Normal gallbladder.There is no biliary ductal dilation. Pancreas: Normal contours without ductal dilatation. No peripancreatic fluid collection. Spleen: Unremarkable. Adrenals/Urinary Tract: --Adrenal glands: Unremarkable. --Right kidney/ureter: No hydronephrosis or  radiopaque kidney stones. --Left kidney/ureter: Left kidney is surgically absent. --Urinary bladder: There is a focus of gas within the urinary bladder. Stomach/Bowel: --Stomach/Duodenum: There is mild gastric wall thickening. --Small bowel: The majority of the small bowel appears to now reside in the left abdomen. --Colon: There is a large amount of stool throughout the rectum. There is a wondering cecum. There is distension of the ascending colon, cecum, and portions of the transverse colon. There is an abrupt transition point in the left upper quadrant where there is twisting of the splenic flexure of the colon. There is haziness of the mesentery in the left upper quadrant. --Appendix: Normal. Vascular/Lymphatic: Atherosclerotic calcification is present within the non-aneurysmal abdominal aorta, without hemodynamically significant stenosis.  --No retroperitoneal lymphadenopathy. --No mesenteric lymphadenopathy. --No pelvic or inguinal lymphadenopathy. Reproductive: Status post hysterectomy. No adnexal mass. Other: No ascites or free air. The abdominal wall is normal. Musculoskeletal. There is an unchanged compression fracture of the L1 vertebral body. IMPRESSION: 1. Findings are consistent with a moderate to high-grade large bowel obstruction. There is twisting of the colon in the left upper quadrant resulting in dilatation of the transverse colon and ascending colon. Findings are concerning for an internal hernia. The appearance of the colon suggest this is a closed loop obstruction. Surgical consultation is recommended. There is no free air or pneumatosis. 2. Mild gastric wall thickening, which may be secondary to underdistention or gastritis. 3. Again noted are findings consistent with metastatic breast cancer. The dominant right hepatic lobe lesion has increased in size since the prior study. 4. Focus of gas within the urinary bladder. Correlation with urinalysis is recommended to help exclude cystitis. 5. Chronic changes of the left lung field as described above. Aortic Atherosclerosis (ICD10-I70.0). Electronically Signed   By: Constance Holster M.D.   On: 12/10/2020 00:56    EKG: Independently reviewed. Sinus rhythm, QTc 534 ms.   Assessment/Plan   1. Bowel obstruction  - Presents with left-sided abdominal pain, N/V, and constipation and is found to have bowel obstruction on CT  - There are no peritoneal signs on exam, no fever, and basic labs and vitals are stable but CT-findings concerning for closed-loop obstruction   - Surgery consultants much appreciated, discussed CT and exam with Dr. Ninfa Linden    - Plan to keep NPO, place NGT, check lactate, continue IVF hydration and pain-control    2. Metastatic breast cancer  - Follows with Dr. [name], has metastases to liver, LNs, lung, and multiple thoracic vertebrae, and is status-post  left mastectomy, chemo, and adjuvent radiation  - Currently treated with Piqray, Faslodex, and Xgeva - Dominant liver lesion has increased on CT this admission   3. CKD  IIIb  - SCr is 1.64 on admission; baseline appears to be ~1.4  - Renally-dose medications, monitor    4. Hypokalemia  - Serum potassium 2.9 in ED with prolonged QT  - KCl added to IVF and empiric magnesium given  - Continue cardiac monitoring, repeat chem panel in am    5. Prolonged QT interval  - QTc 534 ms, likely related to hypokalemia  - KCl added to IVF, empiric magnesium given, continue cardiac monitoring, repeat chem panel and EKG in am    6. CAD - No anginal complaints    DVT prophylaxis: SCDs only pending surgical eval  Code Status: DNR  Family Communication: Discussed with patient  Disposition Plan:  Patient is from: home  Anticipated d/c is to: TBD Anticipated d/c date is: 12/14/20 Patient  currently: Pending return of bowel function, surgical eval  Consults called: Surgery  Admission status: Inpatient     Vianne Bulls, MD [facility]  12/10/2020, 2:33 AM

## 2020-12-11 DIAGNOSIS — K56609 Unspecified intestinal obstruction, unspecified as to partial versus complete obstruction: Secondary | ICD-10-CM | POA: Diagnosis not present

## 2020-12-11 DIAGNOSIS — K56691 Other complete intestinal obstruction: Secondary | ICD-10-CM | POA: Diagnosis not present

## 2020-12-11 DIAGNOSIS — E876 Hypokalemia: Secondary | ICD-10-CM | POA: Diagnosis not present

## 2020-12-11 DIAGNOSIS — N1832 Chronic kidney disease, stage 3b: Secondary | ICD-10-CM | POA: Diagnosis not present

## 2020-12-11 LAB — CBC
HCT: 34.7 % — ABNORMAL LOW (ref 36.0–46.0)
Hemoglobin: 11.2 g/dL — ABNORMAL LOW (ref 12.0–15.0)
MCH: 31.6 pg (ref 26.0–34.0)
MCHC: 32.3 g/dL (ref 30.0–36.0)
MCV: 98 fL (ref 80.0–100.0)
Platelets: 185 10*3/uL (ref 150–400)
RBC: 3.54 MIL/uL — ABNORMAL LOW (ref 3.87–5.11)
RDW: 16.3 % — ABNORMAL HIGH (ref 11.5–15.5)
WBC: 7 10*3/uL (ref 4.0–10.5)
nRBC: 0 % (ref 0.0–0.2)

## 2020-12-11 LAB — COMPREHENSIVE METABOLIC PANEL
ALT: 20 U/L (ref 0–44)
AST: 43 U/L — ABNORMAL HIGH (ref 15–41)
Albumin: 2.7 g/dL — ABNORMAL LOW (ref 3.5–5.0)
Alkaline Phosphatase: 84 U/L (ref 38–126)
Anion gap: 9 (ref 5–15)
BUN: 24 mg/dL — ABNORMAL HIGH (ref 8–23)
CO2: 21 mmol/L — ABNORMAL LOW (ref 22–32)
Calcium: 7.7 mg/dL — ABNORMAL LOW (ref 8.9–10.3)
Chloride: 112 mmol/L — ABNORMAL HIGH (ref 98–111)
Creatinine, Ser: 1.17 mg/dL — ABNORMAL HIGH (ref 0.44–1.00)
GFR, Estimated: 47 mL/min — ABNORMAL LOW (ref >60)
Glucose, Bld: 58 mg/dL — ABNORMAL LOW (ref 70–99)
Potassium: 4.2 mmol/L (ref 3.5–5.1)
Sodium: 142 mmol/L (ref 135–145)
Total Bilirubin: 1.1 mg/dL (ref 0.3–1.2)
Total Protein: 5.1 g/dL — ABNORMAL LOW (ref 6.5–8.1)

## 2020-12-11 LAB — PROTIME-INR
INR: 1.1 (ref 0.8–1.2)
Prothrombin Time: 13.8 seconds (ref 11.4–15.2)

## 2020-12-11 LAB — PHOSPHORUS: Phosphorus: 1.8 mg/dL — ABNORMAL LOW (ref 2.5–4.6)

## 2020-12-11 LAB — MAGNESIUM: Magnesium: 1.9 mg/dL (ref 1.7–2.4)

## 2020-12-11 MED ORDER — LIP MEDEX EX OINT
TOPICAL_OINTMENT | CUTANEOUS | Status: AC
  Filled 2020-12-11: qty 7

## 2020-12-11 MED ORDER — SORBITOL 70 % SOLN
960.0000 mL | TOPICAL_OIL | Freq: Once | ORAL | Status: AC
  Administered 2020-12-11: 960 mL via RECTAL
  Filled 2020-12-11: qty 473

## 2020-12-11 MED ORDER — LIP MEDEX EX OINT
TOPICAL_OINTMENT | CUTANEOUS | Status: DC | PRN
  Administered 2020-12-15: 1 via TOPICAL
  Filled 2020-12-11: qty 7

## 2020-12-11 MED ORDER — CHLORHEXIDINE GLUCONATE 0.12 % MT SOLN
15.0000 mL | Freq: Two times a day (BID) | OROMUCOSAL | Status: DC
  Administered 2020-12-12 – 2020-12-18 (×11): 15 mL via OROMUCOSAL
  Filled 2020-12-11 (×13): qty 15

## 2020-12-11 MED ORDER — ORAL CARE MOUTH RINSE
15.0000 mL | Freq: Two times a day (BID) | OROMUCOSAL | Status: DC
  Administered 2020-12-12 – 2020-12-18 (×6): 15 mL via OROMUCOSAL

## 2020-12-11 NOTE — Progress Notes (Signed)
[facility] PROGRESS NOTE    Progress Note  Wendy [name]  KGM:[id number] DOB: [birth year] DOA: 12/09/2020 PCP: [name], [name]     Brief Narrative:   Wendy [name] is an 80 y.o. 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 CT scan of the abdomen and pelvis was concerning for high-grade bowel obstruction with twisting of the colon in the left upper quadrant, general surgery was consulted, related to concern for closed-loop obstruction due to metastatic disease.  Assessment/Plan:   Large Bowel obstruction [facility]) Surgery recommended conservative management with enemas and GI consult for probable colonoscopy. As she is not having vomiting she is n.p.o. NG tube was not placed continue IV fluids and analgesic. Consult GI. Keep n.p.o. continue IV fluids continue Dulcolax enema.  Metastatic breast cancer: Noted with multiple metastases to liver lung and bone. She has received chemotherapy and adjuvant radiation therapy. Follow-up with Dr. Ladona Mow as an outpatient. Lesions are larger on CT.  Acute kidney injury on chronic kidney disease stage IIIb: Baseline appears around 1.4. Returned to baseline. Likely prerenal azotemia she was started on IV fluids.  Prolonged QTC likely due to hypokalemia: Potassium on admission 2.4, magnesium greater than 2. She was placed on IV fluids with potassium supplementation this morning is 4.2.  CAD: No chest pain.    DVT prophylaxis: lovenxo Family Communication:none Status is: Inpatient  Remains inpatient appropriate because:Hemodynamically unstable   Dispo: The patient is from: Home              Anticipated d/c is to: Home              Anticipated d/c date is: > 3 days              Patient currently is not medically stable to  d/c.        Code Status:     Code Status Orders  (From admission, onward)         Start     Ordered   12/10/20 0233  Do not attempt resuscitation (DNR)  Continuous       Question Answer Comment  In the event of cardiac or respiratory ARREST Do not call a "code blue"   In the event of cardiac or respiratory ARREST Do not perform Intubation, CPR, defibrillation or ACLS   In the event of cardiac or respiratory ARREST Use medication by any route, position, wound care, and other measures to relive pain and suffering. May use oxygen, suction and manual treatment of airway obstruction as needed for comfort.      12/10/20 0233        Code Status History    Date Active Date Inactive Code Status Order ID Comments User Context   08/16/2019 6440 08/22/2019 1717 DNR [id number]  Jani Gravel, MD ED   09/23/2017 0030 09/26/2017 2040 DNR [id number]  Ivor Costa, MD ED   11/12/2013 1623 11/13/2013 1304 Full Code [id number]  Rolm Bookbinder, MD Inpatient   Advance Care Planning Activity        IV Access:    Peripheral IV   Procedures and diagnostic studies:   CT CHEST ABDOMEN PELVIS W CONTRAST  Result Date: 12/10/2020 CLINICAL DATA:  Bowel obstruction suspected. Left lower quadrant abdominal pain. History of breast cancer. EXAM: CT CHEST, ABDOMEN, AND PELVIS WITH  CONTRAST TECHNIQUE: Multidetector CT imaging of the chest, abdomen and pelvis was performed following the standard protocol during bolus administration of intravenous contrast. CONTRAST:  82mL OMNIPAQUE IOHEXOL 300 MG/ML  SOLN COMPARISON:  October 24, 2020. FINDINGS: CT CHEST FINDINGS Cardiovascular: The heart size is stable. There is a trace pericardial effusion. There are atherosclerotic changes of the coronary arteries. There are atherosclerotic changes of the thoracic aorta without evidence for a dissection or aneurysm. Mediastinum/Nodes: -- No mediastinal lymphadenopathy. -- No hilar lymphadenopathy. -- No axillary  lymphadenopathy. -- No supraclavicular lymphadenopathy. --the thyroid gland is not well visualized. -  Unremarkable esophagus. Lungs/Pleura: Chronic changes are again noted of the left lung field, especially within the left upper lobe. There is a chronic left-sided pleural effusion. The trachea is unremarkable. The mediastinum is shifted to the left secondary to left-sided volume loss. Musculoskeletal: There is no definite acute compression fracture. Lytic lesions are again noted throughout the thoracic spine, most notably involving the T11 and T5 vertebral bodies. CT ABDOMEN PELVIS FINDINGS Hepatobiliary: Again noted is a dominant liver lesion in the right hepatic lobe measuring approximately 6.9 x 6.7 cm (previously measuring approximately 5.8 x 6.6 cm. Normal gallbladder.There is no biliary ductal dilation. Pancreas: Normal contours without ductal dilatation. No peripancreatic fluid collection. Spleen: Unremarkable. Adrenals/Urinary Tract: --Adrenal glands: Unremarkable. --Right kidney/ureter: No hydronephrosis or radiopaque kidney stones. --Left kidney/ureter: Left kidney is surgically absent. --Urinary bladder: There is a focus of gas within the urinary bladder. Stomach/Bowel: --Stomach/Duodenum: There is mild gastric wall thickening. --Small bowel: The majority of the small bowel appears to now reside in the left abdomen. --Colon: There is a large amount of stool throughout the rectum. There is a wondering cecum. There is distension of the ascending colon, cecum, and portions of the transverse colon. There is an abrupt transition point in the left upper quadrant where there is twisting of the splenic flexure of the colon. There is haziness of the mesentery in the left upper quadrant. --Appendix: Normal. Vascular/Lymphatic: Atherosclerotic calcification is present within the non-aneurysmal abdominal aorta, without hemodynamically significant stenosis. --No retroperitoneal lymphadenopathy. --No mesenteric  lymphadenopathy. --No pelvic or inguinal lymphadenopathy. Reproductive: Status post hysterectomy. No adnexal mass. Other: No ascites or free air. The abdominal wall is normal. Musculoskeletal. There is an unchanged compression fracture of the L1 vertebral body. IMPRESSION: 1. Findings are consistent with a moderate to high-grade large bowel obstruction. There is twisting of the colon in the left upper quadrant resulting in dilatation of the transverse colon and ascending colon. Findings are concerning for an internal hernia. The appearance of the colon suggest this is a closed loop obstruction. Surgical consultation is recommended. There is no free air or pneumatosis. 2. Mild gastric wall thickening, which may be secondary to underdistention or gastritis. 3. Again noted are findings consistent with metastatic breast cancer. The dominant right hepatic lobe lesion has increased in size since the prior study. 4. Focus of gas within the urinary bladder. Correlation with urinalysis is recommended to help exclude cystitis. 5. Chronic changes of the left lung field as described above. Aortic Atherosclerosis (ICD10-I70.0). Electronically Signed   By: Constance Holster M.D.   On: 12/10/2020 00:56     Medical Consultants:    None.  Anti-Infectives:   none  Subjective:    Wendy [name] Even she has not had nausea or vomiting no abdominal pain she has not had a bowel movement.  Objective:    Vitals:   12/11/20 0400 12/11/20 0429 12/11/20 0430 12/11/20 0630  BP: (!) 98/49  (!) 117/51 (!) 109/55  Pulse: 63 (!) 59 (!) 56 (!) 55  Resp:   18 18  Temp:      TempSrc:      SpO2: 94% 96% 95% 94%  Weight:      Height:       SpO2: 94 %   Intake/Output Summary (Last 24 hours) at 12/11/2020 0700 Last data filed at 12/10/2020 0911 Gross per 24 hour  Intake 3.39 ml  Output --  Net 3.39 ml   Filed Weights   12/09/20 2106  Weight: 63.5 kg    Exam: General exam: In no acute distress. Respiratory  system: Good air movement and clear to auscultation. Cardiovascular system: S1 & S2 heard, RRR. No JVD. Gastrointestinal system: Abdomen is nondistended, soft and nontender.  Extremities: No pedal edema. Skin: No rashes, lesions or ulcers Psychiatry: Judgement and insight appear normal. Mood & affect appropriate.   Data Reviewed:    Labs: Basic Metabolic Panel: Recent Labs  Lab 12/09/20 2120 12/10/20 0326 12/11/20 0500  NA 135 136 142  K 2.9* 2.7* 4.2  CL 91* 96* 112*  CO2 30 28 21*  GLUCOSE 139* 116* 58*  BUN 31* 29* 24*  CREATININE 1.64* 1.51* 1.17*  CALCIUM 10.2 8.9 7.7*  MG  --  2.0 1.9  PHOS  --   --  1.8*   GFR Estimated Creatinine Clearance: 39.1 mL/min (A) (by C-G formula based on SCr of 1.17 mg/dL (H)). Liver Function Tests: Recent Labs  Lab 12/09/20 2120 12/10/20 0326 12/11/20 0500  AST 55* 45* 43*  ALT 28 24 20   ALKPHOS 129* 110 84  BILITOT 1.4* 0.7 1.1  PROT 7.4 6.3* 5.1*  ALBUMIN 4.2 3.4* 2.7*   Recent Labs  Lab 12/09/20 2120  LIPASE 19   No results for input(s): AMMONIA in the last 168 hours. Coagulation profile Recent Labs  Lab 12/11/20 0500  INR 1.1   COVID-19 Labs  No results for input(s): DDIMER, FERRITIN, LDH, CRP in the last 72 hours.  Lab Results  Component Value Date   SARSCOV2NAA NEGATIVE 12/10/2020   [place] NEGATIVE 08/16/2019   [place] NEGATIVE 06/22/2019    CBC: Recent Labs  Lab 12/09/20 2120 12/10/20 0326 12/11/20 0500  WBC 10.4 9.0 7.0  HGB 14.2 12.4 11.2*  HCT 41.9 37.2 34.7*  MCV 94.6 95.6 98.0  PLT 225 198 185   Cardiac Enzymes: No results for input(s): CKTOTAL, CKMB, CKMBINDEX, TROPONINI in the last 168 hours. BNP (last 3 results) No results for input(s): PROBNP in the last 8760 hours. CBG: No results for input(s): GLUCAP in the last 168 hours. D-Dimer: No results for input(s): DDIMER in the last 72 hours. Hgb A1c: No results for input(s): HGBA1C in the last 72 hours. Lipid Profile: No  results for input(s): CHOL, HDL, LDLCALC, TRIG, CHOLHDL, LDLDIRECT in the last 72 hours. Thyroid function studies: No results for input(s): TSH, T4TOTAL, T3FREE, THYROIDAB in the last 72 hours.  Invalid input(s): FREET3 Anemia work up: No results for input(s): VITAMINB12, FOLATE, FERRITIN, TIBC, IRON, RETICCTPCT in the last 72 hours. Sepsis Labs: Recent Labs  Lab 12/09/20 2120 12/10/20 0326 12/11/20 0500  WBC 10.4 9.0 7.0  LATICACIDVEN  --  1.1  --    Microbiology Recent Results (from the past 240 hour(s))  SARS CORONAVIRUS 2 (TAT 6-24 HRS) Nasopharyngeal Nasopharyngeal Swab     Status: None   Collection Time: 12/10/20  3:26 AM   Specimen: Nasopharyngeal Swab  Result Value  Ref Range Status   SARS Coronavirus 2 NEGATIVE NEGATIVE Final    Comment: (NOTE) SARS-CoV-2 target nucleic acids are NOT DETECTED.  The SARS-CoV-2 RNA is generally detectable in upper and lower respiratory specimens during the acute phase of infection. Negative results do not preclude SARS-CoV-2 infection, do not rule out co-infections with other pathogens, and should not be used as the sole basis for treatment or other patient management decisions. Negative results must be combined with clinical observations, patient history, and epidemiological information. The expected result is Negative.  Fact Sheet for Patients: SugarRoll.be  Fact Sheet for Healthcare Providers: https://www.woods-mathews.com/  This test is not yet approved or cleared by the [place] FDA and  has been authorized for detection and/or diagnosis of SARS-CoV-2 by FDA under an Emergency Use Authorization (EUA). This EUA will remain  in effect (meaning this test can be used) for the duration of the COVID-19 declaration under Se ction 564(b)(1) of the Act, 21 U.S.C. section 360bbb-3(b)(1), unless the authorization is terminated or revoked sooner.  Performed at [facility], [place] [address], [place], [place] [address]      Medications:    Continuous Infusions: . 0.9 % NaCl with KCl 40 mEq / L 125 mL/hr at 12/10/20 1029  . famotidine (PEPCID) IV Stopped (12/10/20 0911)      LOS: 1 day   Charlynne Cousins  [facility]  12/11/2020, 7:00 AM

## 2020-12-11 NOTE — ED Notes (Signed)
Report to the floor

## 2020-12-11 NOTE — ED Notes (Signed)
Pt has had a great night. Pt has not c/o N or V. She has not had a dose of pain medicine for the duration of the night.

## 2020-12-11 NOTE — Progress Notes (Addendum)
[facility] Progress Note     Subjective: CC:  Denies abdominal pain, nausea, or vomiting. Denies flatus or BM since admission. States at baseline she fluctuates between diarrhea and constipation requiring manual disimpaction. Reports that on the rare occasion she can pass a formed stool on her own, it is normal caliber and non-bloody.   Objective: Vital signs in last 24 hours: Pulse Rate:  [54-63] 55 (01/17 0630) Resp:  [12-18] 18 (01/17 0630) BP: (95-122)/(46-58) 109/55 (01/17 0630) SpO2:  [90 %-98 %] 94 % (01/17 0630)    Intake/Output from previous day: 01/16 0701 - 01/17 0700 In: 3.4 [IV Piggyback:3.4] Out: -  Intake/Output this shift: No intake/output data recorded.  PE: Gen:  Alert, NAD, pleasant Card:  Regular rate and rhythm (HR 70 bpm during my exam), pedal pulses 2+ BL Pulm:  Normal effort, clear to auscultation bilaterally Abd: Soft, mild distention, mild TTP central, lower abdomen without rebound tenderness/peritonitis. +BS Skin: warm and dry, no rashes  Psych: A&Ox3   Lab Results:  Recent Labs    12/10/20 0326 12/11/20 0500  WBC 9.0 7.0  HGB 12.4 11.2*  HCT 37.2 34.7*  PLT 198 185   BMET Recent Labs    12/10/20 0326 12/11/20 0500  NA 136 142  K 2.7* 4.2  CL 96* 112*  CO2 28 21*  GLUCOSE 116* 58*  BUN 29* 24*  CREATININE 1.51* 1.17*  CALCIUM 8.9 7.7*   PT/INR Recent Labs    12/11/20 0500  LABPROT 13.8  INR 1.1   CMP     Component Value Date/Time   NA 142 12/11/2020 0500   NA 142 05/15/2015 1308   K 4.2 12/11/2020 0500   K 4.3 05/15/2015 1308   CL 112 (H) 12/11/2020 0500   CO2 21 (L) 12/11/2020 0500   CO2 28 05/15/2015 1308   GLUCOSE 58 (L) 12/11/2020 0500   GLUCOSE 88 05/15/2015 1308   BUN 24 (H) 12/11/2020 0500   BUN 16.7 05/15/2015 1308   CREATININE 1.17 (H) 12/11/2020 0500   CREATININE 1.57 (H) 11/27/2020 1040   CREATININE 0.9 05/15/2015 1308   CALCIUM 7.7 (L) 12/11/2020 0500   CALCIUM 10.3 05/15/2015 1308    PROT 5.1 (L) 12/11/2020 0500   PROT 6.6 05/15/2015 1308   ALBUMIN 2.7 (L) 12/11/2020 0500   ALBUMIN 4.2 05/15/2015 1308   AST 43 (H) 12/11/2020 0500   AST 59 (H) 11/27/2020 1040   AST 36 (H) 05/15/2015 1308   ALT 20 12/11/2020 0500   ALT 30 11/27/2020 1040   ALT 48 05/15/2015 1308   ALKPHOS 84 12/11/2020 0500   ALKPHOS 82 05/15/2015 1308   BILITOT 1.1 12/11/2020 0500   BILITOT 0.8 11/27/2020 1040   BILITOT 0.66 05/15/2015 1308   GFRNONAA 47 (L) 12/11/2020 0500   GFRNONAA 33 (L) 11/27/2020 1040   GFRAA 42 (L) 08/01/2020 1106   Lipase     Component Value Date/Time   LIPASE 19 12/09/2020 2120       Studies/Results: CT CHEST ABDOMEN PELVIS W CONTRAST  Result Date: 12/10/2020 CLINICAL DATA:  Bowel obstruction suspected. Left lower quadrant abdominal pain. History of breast cancer. EXAM: CT CHEST, ABDOMEN, AND PELVIS WITH CONTRAST TECHNIQUE: Multidetector CT imaging of the chest, abdomen and pelvis was performed following the standard protocol during bolus administration of intravenous contrast. CONTRAST:  35mL OMNIPAQUE IOHEXOL 300 MG/ML  SOLN COMPARISON:  October 24, 2020. FINDINGS: CT CHEST FINDINGS Cardiovascular: The heart size is stable. There is a trace pericardial effusion.  There are atherosclerotic changes of the coronary arteries. There are atherosclerotic changes of the thoracic aorta without evidence for a dissection or aneurysm. Mediastinum/Nodes: -- No mediastinal lymphadenopathy. -- No hilar lymphadenopathy. -- No axillary lymphadenopathy. -- No supraclavicular lymphadenopathy. --the thyroid gland is not well visualized. -  Unremarkable esophagus. Lungs/Pleura: Chronic changes are again noted of the left lung field, especially within the left upper lobe. There is a chronic left-sided pleural effusion. The trachea is unremarkable. The mediastinum is shifted to the left secondary to left-sided volume loss. Musculoskeletal: There is no definite acute compression fracture. Lytic  lesions are again noted throughout the thoracic spine, most notably involving the T11 and T5 vertebral bodies. CT ABDOMEN PELVIS FINDINGS Hepatobiliary: Again noted is a dominant liver lesion in the right hepatic lobe measuring approximately 6.9 x 6.7 cm (previously measuring approximately 5.8 x 6.6 cm. Normal gallbladder.There is no biliary ductal dilation. Pancreas: Normal contours without ductal dilatation. No peripancreatic fluid collection. Spleen: Unremarkable. Adrenals/Urinary Tract: --Adrenal glands: Unremarkable. --Right kidney/ureter: No hydronephrosis or radiopaque kidney stones. --Left kidney/ureter: Left kidney is surgically absent. --Urinary bladder: There is a focus of gas within the urinary bladder. Stomach/Bowel: --Stomach/Duodenum: There is mild gastric wall thickening. --Small bowel: The majority of the small bowel appears to now reside in the left abdomen. --Colon: There is a large amount of stool throughout the rectum. There is a wondering cecum. There is distension of the ascending colon, cecum, and portions of the transverse colon. There is an abrupt transition point in the left upper quadrant where there is twisting of the splenic flexure of the colon. There is haziness of the mesentery in the left upper quadrant. --Appendix: Normal. Vascular/Lymphatic: Atherosclerotic calcification is present within the non-aneurysmal abdominal aorta, without hemodynamically significant stenosis. --No retroperitoneal lymphadenopathy. --No mesenteric lymphadenopathy. --No pelvic or inguinal lymphadenopathy. Reproductive: Status post hysterectomy. No adnexal mass. Other: No ascites or free air. The abdominal wall is normal. Musculoskeletal. There is an unchanged compression fracture of the L1 vertebral body. IMPRESSION: 1. Findings are consistent with a moderate to high-grade large bowel obstruction. There is twisting of the colon in the left upper quadrant resulting in dilatation of the transverse colon and  ascending colon. Findings are concerning for an internal hernia. The appearance of the colon suggest this is a closed loop obstruction. Surgical consultation is recommended. There is no free air or pneumatosis. 2. Mild gastric wall thickening, which may be secondary to underdistention or gastritis. 3. Again noted are findings consistent with metastatic breast cancer. The dominant right hepatic lobe lesion has increased in size since the prior study. 4. Focus of gas within the urinary bladder. Correlation with urinalysis is recommended to help exclude cystitis. 5. Chronic changes of the left lung field as described above. Aortic Atherosclerosis (ICD10-I70.0). Electronically Signed   By: Constance Holster M.D.   On: 12/10/2020 00:56    Anti-infectives: Anti-infectives (From admission, onward)   None     Assessment/Plan Metastatic breast cancer to liver, lung, bone - currently undergoing chemo. Has history of radiation therapy. AKI on CKD IIIb Prolonged QTC CAD  LBO  - agree with previous surgical evaluation, low suspicion for closed loop bowel obstruction given stable vitals, normal white count and lactate, mild lower abdominal tenderness without peritonitis on abdominal exam.  - agree with GI consult for possible colonoscopy to r/o colonic mass (primary vs metastatic)  - continue daily suppositories, gently enema ok - no acute surgical needs. Signs of closed loop obstruction, bowel ischemia, or complete  LBO may warrant exploratory laparotomy and colostomy.  FEN: NPO, IVF ID: none VTE: SCD's, Lovenox Foley: none Dispo: admitted to medical service, CCS will follow to assist in managing LBO   LOS: 1 day    Obie Dredge, [facility] Surgery Please see Amion for pager number during day hours 7:00am-4:30pm

## 2020-12-11 NOTE — ED Notes (Signed)
Pt able to pass large amt of stool after enema.

## 2020-12-12 ENCOUNTER — Inpatient Hospital Stay (HOSPITAL_COMMUNITY): Payer: Medicare Other

## 2020-12-12 DIAGNOSIS — E876 Hypokalemia: Secondary | ICD-10-CM | POA: Diagnosis not present

## 2020-12-12 DIAGNOSIS — N1832 Chronic kidney disease, stage 3b: Secondary | ICD-10-CM | POA: Diagnosis not present

## 2020-12-12 DIAGNOSIS — K56691 Other complete intestinal obstruction: Secondary | ICD-10-CM | POA: Diagnosis not present

## 2020-12-12 DIAGNOSIS — K56609 Unspecified intestinal obstruction, unspecified as to partial versus complete obstruction: Secondary | ICD-10-CM | POA: Diagnosis not present

## 2020-12-12 LAB — COMPREHENSIVE METABOLIC PANEL
ALT: 24 U/L (ref 0–44)
AST: 45 U/L — ABNORMAL HIGH (ref 15–41)
Albumin: 3.5 g/dL (ref 3.5–5.0)
Alkaline Phosphatase: 107 U/L (ref 38–126)
Anion gap: 15 (ref 5–15)
BUN: 30 mg/dL — ABNORMAL HIGH (ref 8–23)
CO2: 19 mmol/L — ABNORMAL LOW (ref 22–32)
Calcium: 8.8 mg/dL — ABNORMAL LOW (ref 8.9–10.3)
Chloride: 106 mmol/L (ref 98–111)
Creatinine, Ser: 1.17 mg/dL — ABNORMAL HIGH (ref 0.44–1.00)
GFR, Estimated: 47 mL/min — ABNORMAL LOW (ref >60)
Glucose, Bld: 59 mg/dL — ABNORMAL LOW (ref 70–99)
Potassium: 3.9 mmol/L (ref 3.5–5.1)
Sodium: 140 mmol/L (ref 135–145)
Total Bilirubin: 1.3 mg/dL — ABNORMAL HIGH (ref 0.3–1.2)
Total Protein: 6.2 g/dL — ABNORMAL LOW (ref 6.5–8.1)

## 2020-12-12 LAB — CBC
HCT: 41.6 % (ref 36.0–46.0)
Hemoglobin: 13.5 g/dL (ref 12.0–15.0)
MCH: 32 pg (ref 26.0–34.0)
MCHC: 32.5 g/dL (ref 30.0–36.0)
MCV: 98.6 fL (ref 80.0–100.0)
Platelets: 228 10*3/uL (ref 150–400)
RBC: 4.22 MIL/uL (ref 3.87–5.11)
RDW: 16.5 % — ABNORMAL HIGH (ref 11.5–15.5)
WBC: 6.4 10*3/uL (ref 4.0–10.5)
nRBC: 0 % (ref 0.0–0.2)

## 2020-12-12 MED ORDER — SODIUM CHLORIDE 0.9 % IV SOLN: INTRAVENOUS | Status: AC

## 2020-12-12 MED ORDER — CHLORHEXIDINE GLUCONATE CLOTH 2 % EX PADS
6.0000 | MEDICATED_PAD | Freq: Once | CUTANEOUS | Status: AC
  Administered 2020-12-12: 6 via TOPICAL

## 2020-12-12 MED ORDER — CHLORHEXIDINE GLUCONATE CLOTH 2 % EX PADS
6.0000 | MEDICATED_PAD | Freq: Once | CUTANEOUS | Status: AC
  Administered 2020-12-13: 6 via TOPICAL

## 2020-12-12 MED ORDER — FLEET ENEMA 7-19 GM/118ML RE ENEM
1.0000 | ENEMA | Freq: Once | RECTAL | Status: AC
  Administered 2020-12-12: 1 via RECTAL
  Filled 2020-12-12: qty 1

## 2020-12-12 NOTE — Progress Notes (Signed)
[facility] PROGRESS NOTE    Progress Note  Wendy [name]  WGN:[id number] DOB: [birth year] DOA: 12/09/2020 PCP: [name], [name]     Brief Narrative:   Wendy [name] is an 80 y.o. 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 CT scan of the abdomen and pelvis was concerning for high-grade bowel obstruction with twisting of the colon in the left upper quadrant, general surgery was consulted, related to concern for closed-loop obstruction due to metastatic disease.  Assessment/Plan:   Large Bowel obstruction [facility]) Surgery recommended GI consult for possible colonoscopy. She was given a smog enema with a large bowel movement but is now passing gas. NG tube was not placed as she is not vomiting. Continue IV fluids and analgesia. Continue n.p.o. May have ice chips  Metastatic breast cancer: Noted with multiple metastases to liver lung and bone. She has received chemotherapy and adjuvant radiation therapy. Follow-up with Dr. Ladona Mow as an outpatient. Lesions are larger on CT.  Acute kidney injury on chronic kidney disease stage IIIb: Likely due to decreased oral intake her baseline is like around 1.4. She was started on IV fluids and her creatinine returned to baseline likely prerenal azotemia.  Prolonged QTC likely due to hypokalemia: Potassium on admission 2.4, magnesium greater than 2. She was placed on IV fluids with potassium supplementation this morning is 4.2.  CAD: No chest pain.    DVT prophylaxis: lovenxo Family Communication:none Status is: Inpatient  Remains inpatient appropriate because:Hemodynamically unstable   Dispo: The patient is from: Home              Anticipated d/c is to: Home              Anticipated d/c date is: > 3 days               Patient currently is not medically stable to d/c.        Code Status:     Code Status Orders  (From admission, onward)         Start     Ordered   12/10/20 0233  Do not attempt resuscitation (DNR)  Continuous       Question Answer Comment  In the event of cardiac or respiratory ARREST Do not call a "code blue"   In the event of cardiac or respiratory ARREST Do not perform Intubation, CPR, defibrillation or ACLS   In the event of cardiac or respiratory ARREST Use medication by any route, position, wound care, and other measures to relive pain and suffering. May use oxygen, suction and manual treatment of airway obstruction as needed for comfort.      12/10/20 0233        Code Status History    Date Active Date Inactive Code Status Order ID Comments User Context   08/16/2019 [id number] 08/22/2019 1717 DNR [id number]  Jani Gravel, MD ED   09/23/2017 0030 09/26/2017 2040 DNR [id number]  Ivor Costa, MD ED   11/12/2013 1623 11/13/2013 1304 Full Code [id number]  Rolm Bookbinder, MD Inpatient   Advance Care Planning Activity        IV Access:    Peripheral IV   Procedures and diagnostic studies:   No results found.   Medical Consultants:    None.  Anti-Infectives:   none  Subjective:  Wendy [name] denies any abdominal pain nausea or vomiting had a large bowel movement yesterday.  Objective:    Vitals:   12/11/20 2125 12/12/20 0200 12/12/20 0500 12/12/20 0514  BP: (!) 119/50 (!) 105/52  (!) 95/52  Pulse: (!) 55 60  (!) 57  Resp: 18 18  16   Temp: 97.6 F (36.4 C) (!) 97.5 F (36.4 C)  97.6 F (36.4 C)  TempSrc: Oral     SpO2: 100% 98%  96%  Weight:   63.4 kg   Height:       SpO2: 96 %   Intake/Output Summary (Last 24 hours) at 12/12/2020 1007 Last data filed at 12/12/2020 0654 Gross per 24 hour  Intake 0 ml  Output 700 ml  Net -700 ml   Filed Weights   12/09/20 2106 12/12/20 0500  Weight: 63.5 kg 63.4 kg    Exam: General exam: In no  acute distress. Respiratory system: Good air movement and clear to auscultation. Cardiovascular system: S1 & S2 heard, RRR. No JVD. Gastrointestinal system: Abdomen is nondistended, soft and nontender.  Extremities: No pedal edema. Skin: No rashes, lesions or ulcers Psychiatry: Judgement and insight appear normal. Mood & affect appropriate.  Data Reviewed:    Labs: Basic Metabolic Panel: Recent Labs  Lab 12/09/20 2120 12/10/20 0326 12/11/20 0500 12/12/20 0530  NA 135 136 142 140  K 2.9* 2.7* 4.2 3.9  CL 91* 96* 112* 106  CO2 30 28 21* 19*  GLUCOSE 139* 116* 58* 59*  BUN 31* 29* 24* 30*  CREATININE 1.64* 1.51* 1.17* 1.17*  CALCIUM 10.2 8.9 7.7* 8.8*  MG  --  2.0 1.9  --   PHOS  --   --  1.8*  --    GFR Estimated Creatinine Clearance: 39 mL/min (A) (by C-G formula based on SCr of 1.17 mg/dL (H)). Liver Function Tests: Recent Labs  Lab 12/09/20 2120 12/10/20 0326 12/11/20 0500 12/12/20 0530  AST 55* 45* 43* 45*  ALT 28 24 20 24   ALKPHOS 129* 110 84 107  BILITOT 1.4* 0.7 1.1 1.3*  PROT 7.4 6.3* 5.1* 6.2*  ALBUMIN 4.2 3.4* 2.7* 3.5   Recent Labs  Lab 12/09/20 2120  LIPASE 19   No results for input(s): AMMONIA in the last 168 hours. Coagulation profile Recent Labs  Lab 12/11/20 0500  INR 1.1   COVID-19 Labs  No results for input(s): DDIMER, FERRITIN, LDH, CRP in the last 72 hours.  Lab Results  Component Value Date   SARSCOV2NAA NEGATIVE 12/10/2020   [place] NEGATIVE 08/16/2019   [place] NEGATIVE 06/22/2019    CBC: Recent Labs  Lab 12/09/20 2120 12/10/20 0326 12/11/20 0500 12/12/20 0530  WBC 10.4 9.0 7.0 6.4  HGB 14.2 12.4 11.2* 13.5  HCT 41.9 37.2 34.7* 41.6  MCV 94.6 95.6 98.0 98.6  PLT 225 198 185 228   Cardiac Enzymes: No results for input(s): CKTOTAL, CKMB, CKMBINDEX, TROPONINI in the last 168 hours. BNP (last 3 results) No results for input(s): PROBNP in the last 8760 hours. CBG: No results for input(s): GLUCAP in the last  168 hours. D-Dimer: No results for input(s): DDIMER in the last 72 hours. Hgb A1c: No results for input(s): HGBA1C in the last 72 hours. Lipid Profile: No results for input(s): CHOL, HDL, LDLCALC, TRIG, CHOLHDL, LDLDIRECT in the last 72 hours. Thyroid function studies: No results for input(s): TSH, T4TOTAL, T3FREE, THYROIDAB in the last 72 hours.  Invalid input(s): FREET3 Anemia work up: No results for input(s): VITAMINB12,  FOLATE, FERRITIN, TIBC, IRON, RETICCTPCT in the last 72 hours. Sepsis Labs: Recent Labs  Lab 12/09/20 2120 12/10/20 0326 12/11/20 0500 12/12/20 0530  WBC 10.4 9.0 7.0 6.4  LATICACIDVEN  --  1.1  --   --    Microbiology Recent Results (from the past 240 hour(s))  SARS CORONAVIRUS 2 (TAT 6-24 HRS) Nasopharyngeal Nasopharyngeal Swab     Status: None   Collection Time: 12/10/20  3:26 AM   Specimen: Nasopharyngeal Swab  Result Value Ref Range Status   SARS Coronavirus 2 NEGATIVE NEGATIVE Final    Comment: (NOTE) SARS-CoV-2 target nucleic acids are NOT DETECTED.  The SARS-CoV-2 RNA is generally detectable in upper and lower respiratory specimens during the acute phase of infection. Negative results do not preclude SARS-CoV-2 infection, do not rule out co-infections with other pathogens, and should not be used as the sole basis for treatment or other patient management decisions. Negative results must be combined with clinical observations, patient history, and epidemiological information. The expected result is Negative.  Fact Sheet for Patients: SugarRoll.be  Fact Sheet for Healthcare Providers: https://www.woods-mathews.com/  This test is not yet approved or cleared by the [place] FDA and  has been authorized for detection and/or diagnosis of SARS-CoV-2 by FDA under an Emergency Use Authorization (EUA). This EUA will remain  in effect (meaning this test can be used) for the duration of the COVID-19  declaration under Se ction 564(b)(1) of the Act, 21 U.S.C. section 360bbb-3(b)(1), unless the authorization is terminated or revoked sooner.  Performed at [facility], [place] [address] [place], [place] [address]   Culture, Urine     Status: Abnormal (Preliminary result)   Collection Time: 12/10/20  3:26 AM   Specimen: Urine, Random  Result Value Ref Range Status   Specimen Description   Final    URINE, RANDOM Performed at [facility], [place] [address] [place], [place] [address]    Special Requests   Final    NONE Performed at [facility], [place] [address] [place], [place] [address]    Culture (A)  Final    >=100,000 COLONIES/mL GRAM NEGATIVE RODS SUSCEPTIBILITIES TO FOLLOW Performed at [facility], [place] [address] [place], [place] [address]    Report Status PENDING  Incomplete     Medications:   . chlorhexidine  15 mL Mouth Rinse BID  . mouth rinse  15 mL Mouth Rinse q12n4p   Continuous Infusions: . famotidine (PEPCID) IV 20 mg (12/12/20 0952)      LOS: 2 days   Charlynne Cousins  [facility]  12/12/2020, 10:07 AM

## 2020-12-12 NOTE — Progress Notes (Signed)
Discused care with GI medicine  Who recommended a BE to better evaluate. Radiology did not feel BE beneficial.  Unfortunately the only other option is laparoscopy and diversion. The clinical picture does not fit internal hernia as outlined in the radiology report with no pain progression or WBC progression which is common as well as location in descending colon/ splenic flexure since this is usually a fixed structure and not mobile. Discussed need for probable colostomy and she has agreed after much conversation.  Unfortunately other non operative options not possible. Concern for metastatic disease given history and stage 4 breast cancer.   The procedure has been discussed with the patient.  Alternative therapies have been discussed with the patient.  Operative risks include bleeding,  Infection,  Organ injury,  Nerve injury,  Blood vessel injury,  DVT,  Pulmonary embolism,  Death,  And possible reoperation.  Medical management risks include worsening of present situation.  The success of the procedure is 50 -90 % at treating patients symptoms.  The patient understands and agrees to proceed.

## 2020-12-12 NOTE — Anesthesia Preprocedure Evaluation (Addendum)
Anesthesia Evaluation  Patient identified by MRN, date of birth, ID band Patient awake    Reviewed: Allergy & Precautions, NPO status , Patient's Chart, lab work & pertinent test results  History of Anesthesia Complications Negative for: history of anesthetic complications  Airway Mallampati: III  TM Distance: >3 FB Neck ROM: Full    Dental no notable dental hx. (+) Dental Advisory Given   Pulmonary neg pulmonary ROS, former smoker,    Pulmonary exam normal        Cardiovascular hypertension, + CAD, + Past MI (2005), + Cardiac Stents, + Peripheral Vascular Disease and +CHF   Rhythm:Regular Rate:Tachycardia + Systolic murmurs TTE 05/4943: EF 60-65%, impaired relaxation pattern of LV diastolic filling, pericardial effusion is circumferential, mild MR/TR, mildly elevated PASP   Neuro/Psych negative neurological ROS  negative psych ROS   GI/Hepatic Neg liver ROS, GERD  ,SBO   Endo/Other  negative endocrine ROS  Renal/GU Renal InsufficiencyRenal disease  negative genitourinary   Musculoskeletal negative musculoskeletal ROS (+)   Abdominal   Peds  Hematology negative hematology ROS (+)   Anesthesia Other Findings Day of surgery medications reviewed with patient.  Reproductive/Obstetrics negative OB ROS                            Anesthesia Physical Anesthesia Plan  ASA: III  Anesthesia Plan: General   Post-op Pain Management:    Induction: Intravenous, Rapid sequence and Cricoid pressure planned  PONV Risk Score and Plan: 4 or greater and Treatment may vary due to age or medical condition, Ondansetron and Dexamethasone  Airway Management Planned: Oral ETT and Video Laryngoscope Planned  Additional Equipment: None  Intra-op Plan:   Post-operative Plan: Extubation in OR  Informed Consent: I have reviewed the patients History and Physical, chart, labs and discussed the procedure  including the risks, benefits and alternatives for the proposed anesthesia with the patient or authorized representative who has indicated his/her understanding and acceptance.   Patient has DNR.  Discussed DNR with patient and Suspend DNR.   Dental advisory given  Plan Discussed with: Anesthesiologist  Anesthesia Plan Comments:        Anesthesia Quick Evaluation

## 2020-12-12 NOTE — Progress Notes (Signed)
   Subjective/Chief Complaint: Patient had 2 large bowel movements.  Still feels about the same.  Mild diffuse abdominal pain without nausea or vomiting.  Some cramps.  GI consult recommended but has not been done yet.  We will call later today since we cannot manage without this piece of information.  She has no interest in colostomy and I talked her about that today.   Objective: Vital signs in last 24 hours: Temp:  [97.5 F (36.4 C)-97.6 F (36.4 C)] 97.6 F (36.4 C) (01/18 0514) Pulse Rate:  [52-64] 57 (01/18 0514) Resp:  [16-21] 16 (01/18 0514) BP: (95-136)/(50-78) 95/52 (01/18 0514) SpO2:  [96 %-100 %] 96 % (01/18 0514) Weight:  [63.4 kg] 63.4 kg (01/18 0500) Last BM Date: 12/11/20  Intake/Output from previous day: 01/17 0701 - 01/18 0700 In: 0  Out: 700 [Urine:700] Intake/Output this shift: No intake/output data recorded.   Gen:  Alert, NAD, pleasant Card:  Regular rate and rhythm (HR 70 bpm during my exam), pedal pulses 2+ BL Pulm:  Normal effort, clear to auscultation bilaterally Abd: Soft, mild distention, mild TTP central, lower abdomen without rebound tenderness/peritonitis. +BS Skin: warm and dry, no rashes  Psych: A&Ox3   Lab Results:  Recent Labs    12/11/20 0500 12/12/20 0530  WBC 7.0 6.4  HGB 11.2* 13.5  HCT 34.7* 41.6  PLT 185 228   BMET Recent Labs    12/11/20 0500 12/12/20 0530  NA 142 140  K 4.2 3.9  CL 112* 106  CO2 21* 19*  GLUCOSE 58* 59*  BUN 24* 30*  CREATININE 1.17* 1.17*  CALCIUM 7.7* 8.8*   PT/INR Recent Labs    12/11/20 0500  LABPROT 13.8  INR 1.1   ABG No results for input(s): PHART, HCO3 in the last 72 hours.  Invalid input(s): PCO2, PO2  Studies/Results: No results found.  Anti-infectives: Anti-infectives (From admission, onward)   None      Assessment/Plan:  Metastatic breast cancer to liver, lung, bone - currently undergoing chemo. Has history of radiation therapy. AKI on CKD IIIb Prolonged  QTC CAD  LBO  - agree with GI consult for possible colonoscopy to r/o colonic mass (primary vs metastatic) -not yet called-we will call today since we can make no decisions unless this is done.  Patient does not want a colostomy as well. - continue daily suppositories, gently enema ok - no acute surgical needs.  FEN: NPO, IVF ID: none VTE: SCD's, Lovenox Foley: none Dispo: admitted to medical service, CCS will follow to assist in managing LBO   LOS: 2 days    Turner Daniels MD  12/12/2020

## 2020-12-12 NOTE — Consult Note (Signed)
Referring Provider: [facility] Primary Care Physician:  [name], [name] Primary Gastroenterologist:  Dr. Earlie Raveling  Reason for Consultation: Colonic obstruction  HPI: Wendy [name] is a 80 y.o. female with metastatic breast cancer admitted through the emergency room 2 days ago with several days of abdominal pain (especially in the left lower quadrant), nausea, vomiting, and constipation in the setting of longstanding constipation.  A CT scan in the emergency room showed new moderate to high-grade obstruction in the region of the splenic flexure, as well as a cecum that is migrated into the left lower quadrant as was previously noted on her CT scan a couple of months ago..  Since admission, her pain is without obvious improvement or worsening.  It tends to come and go, and can be quite severe at times.  She has had a bowel movement with the help of an enema, as of 2 days ago, and another enema is planned for this evening.  Her vomiting has settled down.  A KUB at this time shows significant colonic dilatation.  I have discussed the patient's CT with Dr. Abigail Miyamoto of radiology, who indicates that there is no evidence of a mass present, either on the current study or on the comparison study from approximately 2 months earlier.  His impression is that of possibly an internal hernia with a partial obstruction.  Dr. Weber Cooks of radiology shares that opinion.  Of note, the patient did have colonoscopy by Dr. Richmond Campbell in November 2020, at which time several polyps were removed; there was an excellent prep and no mention of any mass-effect.  The patient indicates that [name] remarked that she had the "longest, twistiest" colon he had ever seen.  The patient has been seen by general surgery (Dr. Christie Beckers) but the patient at this time is not inclined to have surgery.   Past Medical History:  Diagnosis Date  . Arthritis   . Breast cancer dx'd 2014   left breast  . CAD  (coronary artery disease)   . Hyperlipidemia   . Hypertension   . Metastasis ([place]) dx'd 12/21/17   lung, bones and liver  . Myocardial infarction ([place]) 11/22/04  . Peripheral vascular disease ([place])    atherosclerosis carotid artery-mild    Past Surgical History:  Procedure Laterality Date  . ABDOMINAL HYSTERECTOMY  1982  . CATARACT EXTRACTION W/PHACO Right 11/13/2018   Procedure: CATARACT EXTRACTION PHACO AND INTRAOCULAR LENS PLACEMENT RIGHT EYE;  Surgeon: Baruch Goldmann, MD;  Location: [facility];  Service: Ophthalmology;  Laterality: Right;  right  . ESOPHAGOGASTRODUODENOSCOPY (EGD) WITH PROPOFOL N/A 08/20/2019   Procedure: ESOPHAGOGASTRODUODENOSCOPY (EGD) WITH PROPOFOL;  Surgeon: Doran Stabler, MD;  Location: [facility];  Service: Gastroenterology;  Laterality: N/A;  . GIVENS CAPSULE STUDY N/A 08/18/2019   Procedure: GIVENS CAPSULE STUDY;  Surgeon: Doran Stabler, MD;  Location: [facility];  Service: Gastroenterology;  Laterality: N/A;  . HOT HEMOSTASIS N/A 08/20/2019   Procedure: HOT HEMOSTASIS (ARGON PLASMA COAGULATION/BICAP);  Surgeon: Doran Stabler, MD;  Location: [facility];  Service: Gastroenterology;  Laterality: N/A;  . KNEE ARTHROSCOPY Left    knee  . MASTECTOMY MODIFIED RADICAL Left 11/12/2013   Procedure: MASTECTOMY MODIFIED RADICAL;  Surgeon: Rolm Bookbinder, MD;  Location: [facility];  Service: General;  Laterality: Left;  . 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 PERCUTANEOUS CORONARY STENT INTERVENTION (PCI-S)  2005  . PORTACATH PLACEMENT Right 12/16/2013   Procedure: INSERTION  PORT-A-CATH;  Surgeon: Rolm Bookbinder, MD;  Location: [facility];  Service: General;  Laterality: Right;  . TEE WITHOUT CARDIOVERSION N/A 09/26/2017   Procedure: TRANSESOPHAGEAL ECHOCARDIOGRAM (TEE);  Surgeon: Dorothy Spark, MD;  Location: [facility] ENDOS[facility];  Service: Cardiovascular;  Laterality: N/A;    Prior to Admission medications   Medication Sig Start Date  End Date Taking? Authorizing Provider  acetaminophen (TYLENOL) 500 MG tablet Take 1,000 mg by mouth every 6 (six) hours as needed for mild pain.   Yes [provider]  alpelisib (PIQRAY 200MG  DAILY DOSE) 200 MG Therapy Pack Take one 200mg  tablet with food at the same time daily. Swallow whole, do not crush, chew, or split. Patient taking differently: Take 200 mg by mouth every morning. Take one 200mg  tablet with food at the same time daily. Swallow whole, do not crush, chew, or split. 08/29/20  Yes [name] Lose, MD  Calcium Carb-Cholecalciferol (CALCIUM 600 + D PO) Take 1 tablet by mouth daily.   Yes [provider]  ferrous sulfate (SLOW IRON) 160 (50 Fe) MG TBCR SR tablet Take 1 tablet (160 mg total) by mouth daily. 08/04/19  Yes [name] Lose, MD  hydrochlorothiazide (MICROZIDE) 12.5 MG capsule TAKE 1 CAPSULE(12.5 MG) BY MOUTH DAILY Patient taking differently: Take 12.5 mg by mouth daily. 03/08/20  Yes Lorretta Harp, MD  Multiple Vitamin (MULTIVITAMIN WITH MINERALS) TABS tablet Take 1 tablet by mouth daily.   Yes [provider]  nitroGLYCERIN (NITROSTAT) 0.4 MG SL tablet PLACE 1 TABLET (0.4 MG TOTAL) UNDER THE TONGUE EVERY 5 (FIVE) MINUTES AS NEEDED FOR CHEST PAIN. 09/28/18  Yes [name] Lose, MD  atorvastatin (LIPITOR) 20 MG tablet TAKE 1 TABLET BY MOUTH EVERY NIGHT AT BEDTIME Patient not taking: Reported on 12/10/2020 05/01/20   [name] Lose, MD  loratadine-pseudoephedrine (CLARITIN-D 24 HOUR) 10-240 MG 24 hr tablet Take 1 tablet by mouth daily. Patient not taking: Reported on 12/10/2020 03/06/20   [name] Lose, MD  potassium chloride SA (KLOR-CON) 20 MEQ tablet TAKE 1 TABLET BY MOUTH ONCE DAILY Patient not taking: No sig reported 08/21/20   [name] Lose, MD    Current Facility-Administered Medications  Medication Dose Route Frequency Provider Last Rate Last Admin  . 0.9 %  sodium chloride infusion   Intravenous Continuous Charlynne Cousins, MD 75 mL/hr at  12/12/20 1037 New Bag at 12/12/20 1037  . chlorhexidine (PERIDEX) 0.12 % solution 15 mL  15 mL Mouth Rinse BID Charlynne Cousins, MD   15 mL at 12/12/20 0948  . famotidine (PEPCID) IVPB 20 mg premix  20 mg Intravenous Daily Opyd, Ilene Qua, MD 100 mL/hr at 12/12/20 0952 20 mg at 12/12/20 0952  . lip balm (CARMEX) ointment   Topical PRN Charlynne Cousins, MD      . MEDLINE mouth rinse  15 mL Mouth Rinse q12n4p Charlynne Cousins, MD      . morphine 2 MG/ML injection 1-3 mg  1-3 mg Intravenous Q3H PRN Opyd, Ilene Qua, MD   1 mg at 12/11/20 1721  . ondansetron (ZOFRAN) injection 4 mg  4 mg Intravenous Q6H PRN Opyd, Ilene Qua, MD        Allergies as of 12/09/2020  . (No Known Allergies)    Family History  Problem Relation Age of Onset  . Emphysema Mother   . Heart attack Father     Social History   Socioeconomic History  . Marital status: Divorced    Spouse name: Not on file  .  Number of children: 3  . Years of education: Not on file  . Highest education level: Not on file  Occupational History    Employer: HOSPICE   Tobacco Use  . Smoking status: Former Smoker    Packs/day: 0.30    Types: Cigarettes    Quit date: 11/09/1983    Years since quitting: 37.1  . Smokeless tobacco: Never Used  Vaping Use  . Vaping Use: Never used  Substance and Sexual Activity  . Alcohol use: Not Currently  . Drug use: No  . Sexual activity: Not Currently  Other Topics Concern  . Not on file  Social History Narrative  . Not on file   Social Determinants of Health   Financial Resource Strain: Not on file  Food Insecurity: Not on file  Transportation Needs: Not on file  Physical Activity: Not on file  Stress: Not on file  Social Connections: Not on file  Intimate Partner Violence: Not on file   Physical Exam: Vital signs in last 24 hours: Temp:  [97.5 F (36.4 C)-97.6 F (36.4 C)] 97.6 F (36.4 C) (01/18 0514) Pulse Rate:  [52-64] 57 (01/18 0514) Resp:  [16-21] 16 (01/18  0514) BP: (95-136)/(50-78) 95/52 (01/18 0514) SpO2:  [96 %-100 %] 96 % (01/18 0514) Weight:  [63.4 kg] 63.4 kg (01/18 0500) Last BM Date: 12/11/20   This is a pleasant, coherent, slightly uncomfortable older Caucasian female, not in acute distress.  She does not look cachectic.  She is anicteric and without evidence of pallor.  Chest is clear, heart has a soft systolic murmur, and the abdomen is nondistended but significantly tympanitic and with gurgling, obstructive bowel symptoms.  Intake/Output from previous day: 01/17 0701 - 01/18 0700 In: 0  Out: 700 [Urine:700] Intake/Output this shift: Total I/O In: 0  Out: 100 [Urine:100]  Lab Results: Recent Labs    12/10/20 0326 12/11/20 0500 12/12/20 0530  WBC 9.0 7.0 6.4  HGB 12.4 11.2* 13.5  HCT 37.2 34.7* 41.6  PLT 198 185 228   BMET Recent Labs    12/10/20 0326 12/11/20 0500 12/12/20 0530  NA 136 142 140  K 2.7* 4.2 3.9  CL 96* 112* 106  CO2 28 21* 19*  GLUCOSE 116* 58* 59*  BUN 29* 24* 30*  CREATININE 1.51* 1.17* 1.17*  CALCIUM 8.9 7.7* 8.8*   LFT Recent Labs    12/12/20 0530  PROT 6.2*  ALBUMIN 3.5  AST 45*  ALT 24  ALKPHOS 107  BILITOT 1.3*   PT/INR Recent Labs    12/11/20 0500  LABPROT 13.8  INR 1.1    Studies/Results: No results found.  Impression:  Almost certainly, this is an anatomic problem related to herniation and/or torsion of the colon, as opposed to a mass-induced stricture or blockage.  As such, colonoscopy will not likely provide any further diagnostic clarification.  It is theoretically possible that passage of the scope would reduce a torsion if one is present, in a fashion analogous to colonoscopic management of a sigmoid volvulus. However, if an internal hernia is present, there is a moderate risk of nonsuccess, and perhaps even worsening of the situation by impaction of the colonoscope, and/or a ball-valve phenomenon with insufflation of gas proximally causing further  distention of the colon upstream, which in turn could lead to increased pain and perhaps even colonic ischemia.  Plan:  I have discussed the case with Dr. Brantley Stage and also with the radiologists.  Unfortunately, it does not appear that there is  any significant likelihood that colonoscopic intervention can fix this patient's problem.  Specifically, I do not think I can "detorse" the colon, nor is the radiographic appearance one that would likely be amenable to stenting.  I explained this to the patient at some length, along with my recommendation that she consider conservative surgery to try to control her abdominal pain--not to extend her life, but for symptom control.  Since she is not moribund or immediately terminal, this would seem to be a reasonable course of action.  However, she points out that she is "ready to go and meet her Creator" and it does appear that she has significant pain from her metastatic disease, so if that cannot be controlled, it could make her remaining time challenging, functionally limited, and unpleasant.  We have contacted the patient's oncologist, Dr. [name], to get his opinion regarding where the patient is in the trajectory of her disease, which would help to guide the decision regarding how aggressive to be in addressing her colonic obstruction.  I will sign off at this time, but would be happy to see the patient again if it would be helpful.  Also, please feel free to call me if you would like to discuss her case.   LOS: 2 days   Youlanda Mighty Jeremias Broyhill  12/12/2020, 11:59 AM   Pager [phone] If no answer or after 5 PM call [phone]

## 2020-12-12 NOTE — Progress Notes (Signed)
Immediately following enema patient experienced stomach cramping, 2 large hard balls of stool and some liquid stool returned. About 30 minutes after enema has had 2 incontinent episodes of mushy dark green/ brown stool.

## 2020-12-12 NOTE — Progress Notes (Signed)
Oncology I was asked to make a comment about her prognosis given the current health situation. She has a known history of metastatic breast cancer since January 2019.  She had pleural effusion that on thoracentesis came back as ER/PR positive HER2 negative.  She is currently on third line treatment with Piqray along with Faslodex and Xgeva.  She has been on this current treatment for the past 1 year.  Last set of scans done on 10/24/2020 showed stable liver and bone metastases. The current scan dated 12/10/2020 showed progression of the right hepatic lesion from 6.6 cm to 6.9 cm  Overall prognosis: Median overall survival in patients who receive Piqray with ER positive breast cancer can be between 1 to 2 years. The data from [facility] 1 clinical trial showed progression free survival of 11 months but overall survival was not reached and has not been reported.  If patient does not wish to pursue surgery for her intestinal obstruction, she would have poor performance status and likely result in her death.  Will discuss these issues tomorrow when I hope to see her in the hospital.

## 2020-12-12 NOTE — Progress Notes (Signed)
Pt expresses concerns about tomorrow's scheduled surgery. Pt verbalizes that she is unsure about the possibility of a colostomy. Nurse reassured pt that the doctor would come and see her prior to surgery to discuss these concerns.

## 2020-12-12 NOTE — H&P (View-Only) (Signed)
Discused care with GI medicine  Who recommended a BE to better evaluate. Radiology did not feel BE beneficial.  Unfortunately the only other option is laparoscopy and diversion. The clinical picture does not fit internal hernia as outlined in the radiology report with no pain progression or WBC progression which is common as well as location in descending colon/ splenic flexure since this is usually a fixed structure and not mobile. Discussed need for probable colostomy and she has agreed after much conversation.  Unfortunately other non operative options not possible. Concern for metastatic disease given history and stage 4 breast cancer.   The procedure has been discussed with the patient.  Alternative therapies have been discussed with the patient.  Operative risks include bleeding,  Infection,  Organ injury,  Nerve injury,  Blood vessel injury,  DVT,  Pulmonary embolism,  Death,  And possible reoperation.  Medical management risks include worsening of present situation.  The success of the procedure is 50 -90 % at treating patients symptoms.  The patient understands and agrees to proceed.

## 2020-12-13 ENCOUNTER — Inpatient Hospital Stay (HOSPITAL_COMMUNITY): Payer: Medicare Other | Admitting: Anesthesiology

## 2020-12-13 ENCOUNTER — Encounter (HOSPITAL_COMMUNITY)
Admission: EM | Disposition: A | Discharge status: Hospice | Payer: Self-pay | Source: Home / Self Care | Attending: Student

## 2020-12-13 ENCOUNTER — Encounter (HOSPITAL_COMMUNITY): Payer: Self-pay | Admitting: Family Medicine

## 2020-12-13 DIAGNOSIS — N189 Chronic kidney disease, unspecified: Secondary | ICD-10-CM

## 2020-12-13 DIAGNOSIS — R7989 Other specified abnormal findings of blood chemistry: Secondary | ICD-10-CM | POA: Diagnosis not present

## 2020-12-13 DIAGNOSIS — N179 Acute kidney failure, unspecified: Secondary | ICD-10-CM | POA: Diagnosis not present

## 2020-12-13 DIAGNOSIS — K56691 Other complete intestinal obstruction: Secondary | ICD-10-CM | POA: Diagnosis not present

## 2020-12-13 DIAGNOSIS — I2510 Atherosclerotic heart disease of native coronary artery without angina pectoris: Secondary | ICD-10-CM | POA: Diagnosis not present

## 2020-12-13 HISTORY — PX: LAPAROSCOPIC LYSIS OF ADHESIONS: SHX5905

## 2020-12-13 LAB — URINE CULTURE: Culture: >=100000 — AB

## 2020-12-13 LAB — GLUCOSE, CAPILLARY
Glucose-Capillary: 51 mg/dL — ABNORMAL LOW (ref 70–99)
Glucose-Capillary: 64 mg/dL — ABNORMAL LOW (ref 70–99)
Glucose-Capillary: 66 mg/dL — ABNORMAL LOW (ref 70–99)
Glucose-Capillary: 110 mg/dL — ABNORMAL HIGH (ref 70–99)

## 2020-12-13 LAB — SURGICAL PCR SCREEN
MRSA, PCR: NEGATIVE
Staphylococcus aureus: NEGATIVE

## 2020-12-13 SURGERY — LYSIS, ADHESIONS, LAPAROSCOPIC
Anesthesia: General | Site: Abdomen

## 2020-12-13 MED ORDER — PROPOFOL 10 MG/ML IV BOLUS
INTRAVENOUS | Status: DC | PRN
  Administered 2020-12-13: 100 mg via INTRAVENOUS

## 2020-12-13 MED ORDER — CHLORHEXIDINE GLUCONATE CLOTH 2 % EX PADS
6.0000 | MEDICATED_PAD | Freq: Every day | CUTANEOUS | Status: DC
  Administered 2020-12-14 – 2020-12-18 (×2): 6 via TOPICAL

## 2020-12-13 MED ORDER — CHLORHEXIDINE GLUCONATE 0.12 % MT SOLN
15.0000 mL | OROMUCOSAL | Status: AC
  Administered 2020-12-13: 15 mL via OROMUCOSAL

## 2020-12-13 MED ORDER — SODIUM CHLORIDE 0.9 % IV SOLN
2.0000 g | INTRAVENOUS | Status: AC
  Administered 2020-12-13: 2 g via INTRAVENOUS

## 2020-12-13 MED ORDER — SODIUM CHLORIDE 0.9 % IV SOLN: INTRAVENOUS | Status: DC

## 2020-12-13 MED ORDER — DEXTROSE 50 % IV SOLN
INTRAVENOUS | Status: AC
  Administered 2020-12-13: 50 mL
  Filled 2020-12-13: qty 50

## 2020-12-13 MED ORDER — SUCCINYLCHOLINE CHLORIDE 200 MG/10ML IV SOSY
PREFILLED_SYRINGE | INTRAVENOUS | Status: AC
  Filled 2020-12-13: qty 10

## 2020-12-13 MED ORDER — FENTANYL CITRATE (PF) 250 MCG/5ML IJ SOLN
INTRAMUSCULAR | Status: DC | PRN
  Administered 2020-12-13: 25 ug via INTRAVENOUS
  Administered 2020-12-13: 50 ug via INTRAVENOUS
  Administered 2020-12-13: 25 ug via INTRAVENOUS

## 2020-12-13 MED ORDER — PROPOFOL 10 MG/ML IV BOLUS
INTRAVENOUS | Status: AC
  Filled 2020-12-13: qty 20

## 2020-12-13 MED ORDER — ROCURONIUM BROMIDE 10 MG/ML (PF) SYRINGE
PREFILLED_SYRINGE | INTRAVENOUS | Status: AC
  Filled 2020-12-13: qty 10

## 2020-12-13 MED ORDER — LACTATED RINGERS IV SOLN
INTRAVENOUS | Status: AC | PRN
  Administered 2020-12-13: 250 mL

## 2020-12-13 MED ORDER — BUPIVACAINE HCL (PF) 0.5 % IJ SOLN
INTRAMUSCULAR | Status: DC | PRN
  Administered 2020-12-13: 3 mL

## 2020-12-13 MED ORDER — LACTATED RINGERS IV SOLN: Freq: Once | INTRAVENOUS | Status: AC

## 2020-12-13 MED ORDER — ENOXAPARIN SODIUM 40 MG/0.4ML ~~LOC~~ SOLN
40.0000 mg | SUBCUTANEOUS | Status: DC
  Administered 2020-12-14 – 2020-12-18 (×5): 40 mg via SUBCUTANEOUS
  Filled 2020-12-13 (×5): qty 0.4

## 2020-12-13 MED ORDER — ONDANSETRON HCL 4 MG/2ML IJ SOLN
INTRAMUSCULAR | Status: DC | PRN
  Administered 2020-12-13: 4 mg via INTRAVENOUS

## 2020-12-13 MED ORDER — PHENYLEPHRINE HCL-NACL 10-0.9 MG/250ML-% IV SOLN
INTRAVENOUS | Status: DC | PRN
  Administered 2020-12-13: 50 ug/min via INTRAVENOUS

## 2020-12-13 MED ORDER — SUCCINYLCHOLINE CHLORIDE 200 MG/10ML IV SOSY
PREFILLED_SYRINGE | INTRAVENOUS | Status: DC | PRN
  Administered 2020-12-13: 100 mg via INTRAVENOUS

## 2020-12-13 MED ORDER — ACETAMINOPHEN 325 MG PO TABS
650.0000 mg | ORAL_TABLET | Freq: Four times a day (QID) | ORAL | Status: DC | PRN
  Administered 2020-12-15: 22:00:00 650 mg via ORAL
  Filled 2020-12-13: qty 2

## 2020-12-13 MED ORDER — POLYVINYL ALCOHOL 1.4 % OP SOLN
1.0000 [drp] | OPHTHALMIC | Status: DC | PRN
  Administered 2020-12-13 – 2020-12-18 (×6): 1 [drp] via OPHTHALMIC
  Filled 2020-12-13 (×2): qty 15

## 2020-12-13 MED ORDER — FENTANYL CITRATE (PF) 100 MCG/2ML IJ SOLN
25.0000 ug | INTRAMUSCULAR | Status: DC | PRN
Start: 2020-12-13 — End: 2020-12-13

## 2020-12-13 MED ORDER — BUPIVACAINE HCL (PF) 0.5 % IJ SOLN
INTRAMUSCULAR | Status: AC
  Filled 2020-12-13: qty 30

## 2020-12-13 MED ORDER — ROCURONIUM BROMIDE 10 MG/ML (PF) SYRINGE
PREFILLED_SYRINGE | INTRAVENOUS | Status: DC | PRN
  Administered 2020-12-13 (×2): 20 mg via INTRAVENOUS

## 2020-12-13 MED ORDER — PHENYLEPHRINE 40 MCG/ML (10ML) SYRINGE FOR IV PUSH (FOR BLOOD PRESSURE SUPPORT)
PREFILLED_SYRINGE | INTRAVENOUS | Status: DC | PRN
  Administered 2020-12-13 (×2): 80 ug via INTRAVENOUS

## 2020-12-13 MED ORDER — DEXAMETHASONE SODIUM PHOSPHATE 10 MG/ML IJ SOLN
INTRAMUSCULAR | Status: DC | PRN
  Administered 2020-12-13: 10 mg via INTRAVENOUS

## 2020-12-13 MED ORDER — SODIUM CHLORIDE 0.9 % IV SOLN
INTRAVENOUS | Status: AC
  Filled 2020-12-13: qty 2

## 2020-12-13 MED ORDER — DEXTROSE 50 % IV SOLN: 25.0000 mL | Freq: Once | INTRAVENOUS | Status: AC

## 2020-12-13 MED ORDER — FENTANYL CITRATE (PF) 250 MCG/5ML IJ SOLN
INTRAMUSCULAR | Status: AC
  Filled 2020-12-13: qty 5

## 2020-12-13 MED ORDER — SUGAMMADEX SODIUM 200 MG/2ML IV SOLN
INTRAVENOUS | Status: DC | PRN
  Administered 2020-12-13: 200 mg via INTRAVENOUS
  Administered 2020-12-13: 100 mg via INTRAVENOUS

## 2020-12-13 MED ORDER — DEXAMETHASONE SODIUM PHOSPHATE 10 MG/ML IJ SOLN
INTRAMUSCULAR | Status: AC
  Filled 2020-12-13: qty 1

## 2020-12-13 MED ORDER — LIDOCAINE 2% (20 MG/ML) 5 ML SYRINGE
INTRAMUSCULAR | Status: DC | PRN
  Administered 2020-12-13: 60 mg via INTRAVENOUS

## 2020-12-13 MED ORDER — LIDOCAINE HCL (PF) 2 % IJ SOLN
INTRAMUSCULAR | Status: AC
  Filled 2020-12-13: qty 5

## 2020-12-13 MED ORDER — DEXTROSE-NACL 5-0.9 % IV SOLN: INTRAVENOUS | Status: DC

## 2020-12-13 MED ORDER — PHENYLEPHRINE HCL (PRESSORS) 10 MG/ML IV SOLN
INTRAVENOUS | Status: AC
  Filled 2020-12-13: qty 1

## 2020-12-13 SURGICAL SUPPLY — 30 items
COVER WAND RF STERILE (DRAPES) IMPLANT
DECANTER SPIKE VIAL GLASS SM (MISCELLANEOUS) IMPLANT
DERMABOND ADVANCED (GAUZE/BANDAGES/DRESSINGS) ×1
DERMABOND ADVANCED .7 DNX12 (GAUZE/BANDAGES/DRESSINGS) ×1 IMPLANT
DEVICE SUTURE ENDOST 10MM (ENDOMECHANICALS) IMPLANT
DRAPE WARM FLUID 44X44 (DRAPES) ×3 IMPLANT
ELECT REM PT RETURN 15FT ADLT (MISCELLANEOUS) ×3 IMPLANT
GLOVE INDICATOR 8.0 STRL GRN (GLOVE) ×6 IMPLANT
GLOVE SS BIOGEL STRL SZ 7.5 (GLOVE) ×2 IMPLANT
GLOVE SUPERSENSE BIOGEL SZ 7.5 (GLOVE) ×1
GLOVE SURG UNDER POLY LF SZ7 (GLOVE) ×3 IMPLANT
GOWN STRL REUS W/TWL LRG LVL3 (GOWN DISPOSABLE) ×3 IMPLANT
GOWN STRL REUS W/TWL XL LVL3 (GOWN DISPOSABLE) ×6 IMPLANT
KIT BASIN OR (CUSTOM PROCEDURE TRAY) ×3 IMPLANT
KIT TURNOVER KIT A (KITS) IMPLANT
SEALER TISSUE G2 STRG ARTC 35C (ENDOMECHANICALS) ×2 IMPLANT
SET IRRIG TUBING LAPAROSCOPIC (IRRIGATION / IRRIGATOR) ×3 IMPLANT
SET TUBE SMOKE EVAC HIGH FLOW (TUBING) ×3 IMPLANT
SHEARS HARMONIC ACE PLUS 36CM (ENDOMECHANICALS) IMPLANT
SLEEVE XCEL OPT CAN 5 100 (ENDOMECHANICALS) ×2 IMPLANT
SUT VIC AB 3-0 SH 18 (SUTURE) ×2 IMPLANT
SUT VIC AB 4-0 PS2 27 (SUTURE) IMPLANT
TOWEL OR 17X26 10 PK STRL BLUE (TOWEL DISPOSABLE) ×3 IMPLANT
TRAY FOLEY MTR SLVR 16FR STAT (SET/KITS/TRAYS/PACK) IMPLANT
TRAY LAPAROSCOPIC (CUSTOM PROCEDURE TRAY) ×3 IMPLANT
TROCAR BLADELESS OPT 5 100 (ENDOMECHANICALS) ×3 IMPLANT
TROCAR XCEL BLUNT TIP 100MML (ENDOMECHANICALS) ×3 IMPLANT
TROCAR XCEL NON-BLD 11X100MML (ENDOMECHANICALS) IMPLANT
TROCAR XCEL UNIV SLVE 11M 100M (ENDOMECHANICALS) IMPLANT
TUBING IRRIGATION (MISCELLANEOUS) IMPLANT

## 2020-12-13 NOTE — Interval H&P Note (Signed)
History and Physical Interval Note:  12/13/2020 10:12 AM  Wendy [name]  has presented today for surgery, with the diagnosis of Small bowel obstruction.  The various methods of treatment have been discussed with the patient and family. After consideration of risks, benefits and other options for treatment, the patient has consented to  Procedure(s): LAPAROSCOPIC COLOSTOMY, POSS SMALL BOWEL RESECTION (N/A) as a surgical intervention.  The patient's history has been reviewed, patient examined, no change in status, stable for surgery.  I have reviewed the patient's chart and labs.  Questions were answered to the patient's satisfaction.   The procedure has been discussed with the patient.  Alternative therapies have been discussed with the patient.  Operative risks include bleeding,  Infection,  Organ injury,  Nerve injury,  Blood vessel injury,  DVT,  Pulmonary embolism,  Death,  And possible reoperation.  Medical management risks include worsening of present situation.  The success of the procedure is 50 -90 % at treating patients symptoms.  The patient understands and agrees to proceed.   Turner Daniels MD

## 2020-12-13 NOTE — Anesthesia Procedure Notes (Signed)
Procedure Name: Intubation Date/Time: 12/13/2020 11:53 AM Performed by: Myna Bright, CRNA Pre-anesthesia Checklist: Patient identified, Emergency Drugs available, Suction available and Patient being monitored Patient Re-evaluated:Patient Re-evaluated prior to induction Oxygen Delivery Method: Circle system utilized Preoxygenation: Pre-oxygenation with 100% oxygen Induction Type: IV induction, Rapid sequence and Cricoid Pressure applied Laryngoscope Size: Glidescope and 3 Tube type: Oral Tube size: 7.0 mm Number of attempts: 1 Airway Equipment and Method: Stylet Placement Confirmation: ETT inserted through vocal cords under direct vision,  positive ETCO2 and breath sounds checked- equal and bilateral Secured at: 21 cm Tube secured with: Tape Dental Injury: Teeth and Oropharynx as per pre-operative assessment  Comments: RSI. Glidescope used d/t previously documented Grade III view and need for RSI. Good view with Glide, ETT passed without resistance, +EtCO2, +BBS

## 2020-12-13 NOTE — Plan of Care (Signed)
  Problem: Education: Goal: Knowledge of General Education information will improve Description: Including pain rating scale, medication(s)/side effects and non-pharmacologic comfort measures Outcome: Completed/Met

## 2020-12-13 NOTE — Op Note (Signed)
Preoperative diagnosis: Large bowel obstruction  Postop diagnosis: Large bowel obstruction secondary to omental adhesion  Procedure: Laparoscopic lysis of adhesion  Surgeon: Erroll Luna, MD  Assistant: Dr. Nadeen Landau MD  Anesthesia: General with 0.25% Marcaine plain  EBL: Minimal  Specimen: None  Intraoperative findings: She had ascites.  She is obvious evidence of metastatic breast cancer to both lobes of her liver.  She had a single omental band across the splenic flexure causing mechanical obstruction.  Possible carcinomatosis in the left upper quadrant.  Remainder of the peritoneum appeared free of any gross metastatic disease.  Cecum was distended but viable with no signs of ischemia.  Ascending colon, transverse colon distended without signs of ischemia.  Distal to the adhesive band the descending colon was decompressed through the sigmoid.  Small bowel edema noted.  Indications for procedure: The patient is a 80 year old female with stage IV breast cancer involving lung, liver and bone.  She is on second line therapy.  She was admitted with a large bowel obstruction on Saturday.  She had not improved on medical management despite enemas.  CT scan showed obstruction at the splenic flexure which was felt to be secondary to an internal hernia but the radiologist.  GI was consulted as well as request for barium enema but neither was felt to be beneficial in this case.  She proceeded to the operating today for diagnostic laparoscopy with possibly colostomy versus intervention otherwise depending on Her findings.  I discussed this at great length with her.  Discussed the need for potential possible colostomy and long-term expectations.  She was seen by medical oncology and felt to have a life span of at least 2 years despite being on second line therapy for metastatic breast cancer.  Her disease has been slowly progressing but her liver disease apparently's been stable.  After discussion  of all the above as well as other alternatives she wished to proceed with laparoscopy with the above mentioned procedure if necessary.The procedure has been discussed with the patient.  Alternative therapies have been discussed with the patient.  Operative risks include bleeding,  Infection,  Organ injury,  Nerve injury,  Blood vessel injury,  DVT,  Pulmonary embolism,  Death,  And possible reoperation.  Medical management risks include worsening of present situation.  The success of the procedure is 50 -90 % at treating patients symptoms.  The patient understands and agrees to proceed.   Description of procedure: The patient was placed supine on the OR table after being seen in the holding area.  After induction of general esthesia, the a Foley cath was placed in sterile conditions she was prepped and draped in sterile fashion.  Right arm was put on armboard.  Timeout was performed.  She received appropriate preoperative antibiotics.  A 1 cm inferomedial incision was made and dissection was carried down to the fascia.  The fascia was opened in the midline and ascites which was clear was noted.  Pursestring suture 0 Vicryl was placed and 12 mm Hassan cannula was placed under direct vision.  Pneumoperitoneum was created to 15 mmHg CO2 pressure.  Laparoscopy was performed.  She has significant distention of both small and large bowel.  We were able to place an left upper quadrant 5 mm ports under direct vision.  Then the patient was placed in steep reverse Trendelenburg and rolled to the right.  We were then able to place a second 5 mm port in the patient's left lower quadrant.  The cecum  was visualized.  It was viable with no signs of ischemia.  The ascending transverse colons appeared viable as well and her obstruction appeared to be at the splenic flexure as indicated by imaging.  She had a single adhesive omental band across the transverse colon at the level of the splenic flexure.  This was lysed.  This was  her point obstruction.  And released the obstruction to the transverse colon which was indicated on preoperative valuation.  There is no evidence of an internal hernia.  We then ran the colon distal to the point obstruction and appeared to decompress as it went down the left sidewall.  Given the amount of edema of the bowel I saw no other point of obstruction.  Concern was for bowel perforation with further manipulation. This  appeared to explain her findings and appeared to decompress her.  I saw no need for colostomy at this point after the patient was very adamant about not having one  unless absolutely necessary.  Given these findings, I felt was prudent to terminate the procedure since this explained her obstruction.  Of note the liver shows significant metastatic breast cancer disease involving both right and left lobes.  She did not have any significant visible  carcinomatosis but has significant ascites noted.  At this point we opted to remove the ports under direct vision.  I closed the umbilical wound under direct vision with interrupted #1 Vicryl with care taken not to entrap the bowel.  Skin incisions closed with 4-0 Monocryl.  Dermabond applied.  All counts were found to be correct.  The patient was awoke extubated taken to recovery in satisfactory condition.

## 2020-12-13 NOTE — Progress Notes (Signed)
Hypoglycemic Event  CBG: 66  Treatment: D50 25 mL (12.5 gm)  Symptoms: None  Follow-up CBG: Time:1552 CBG Result:110  Possible Reasons for Event: Inadequate meal intake  Comments/MD notified: Dr. Cyndia Skeeters orders received     Maryjo Rochester, Laurel Dimmer

## 2020-12-13 NOTE — Anesthesia Postprocedure Evaluation (Signed)
Anesthesia Post Note  Patient: Wendy [name]  Procedure(s) Performed: LAPAROSCOPIC LYSIS OF ADHESIONS (Abdomen)     Patient location during evaluation: PACU Anesthesia Type: General Level of consciousness: awake and alert and oriented Pain management: pain level controlled Vital Signs Assessment: post-procedure vital signs reviewed and stable Respiratory status: spontaneous breathing, nonlabored ventilation and respiratory function stable Cardiovascular status: blood pressure returned to baseline Postop Assessment: no apparent nausea or vomiting Anesthetic complications: no   No complications documented.  Last Vitals:  Vitals:   12/13/20 1400 12/13/20 1417  BP: 137/66 (!) 145/63  Pulse: 67 67  Resp: 12 16  Temp: (!) 36.3 C (!) 36.3 C  SpO2: 100% 99%    Last Pain:  Vitals:   12/13/20 1417  TempSrc: Oral  PainSc:                  Brennan Bailey

## 2020-12-13 NOTE — H&P (Signed)
Discussed procedure with patient again today.  She has been seen by medical oncology and felt to have a life expectancy of 1 to 2 years of treatment.  Explained she has complete colonic obstruction.  There are no nonoperative means to address this.  Have recommended laparoscopic colostomy formation for palliation of her large bowel obstruction which has not resolved with medical management.  I discussed the pros and cons of this.  I discussed the need for a colostomy today.  The alternative is to do nothing and she will probably die within 3 to 5 days of colonic perforation and sepsis.  I discussed this at great length with her at the bedside again this morning and she voiced understanding.  She agreed to proceed with laparoscopic colostomy which will help improve her quality of life and able her to leave the hospital and still have quality of life days and functional days independently.  Appreciate input by Dr. [name].

## 2020-12-13 NOTE — Progress Notes (Signed)
PROGRESS NOTE  Wendy [name]:[id number] DOB: 1941/10/05   PCP: [name], [name]  Patient is from: Home  DOA: 12/09/2020 LOS: 3  Chief complaints: Abdominal pain, nausea and emesis  Brief Narrative / Interim history: 80 y.o. 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 CT scan of the abdomen and pelvis was concerning for high-grade bowel obstruction with twisting of the colon in the left upper quadrant, general surgery was consulted, related to concern for closed-loop obstruction due to metastatic disease.  GI and oncology consulted as well.  She underwent laparoscopic lysis of omental adhesion on 12/14/2019.    Subjective: Seen and examined this afternoon after she returned from surgery.  Reports some nausea and soreness in her abdomen.  Denies chest pain or dyspnea.  Objective: Vitals:   12/13/20 1330 12/13/20 1345 12/13/20 1400 12/13/20 1417  BP: (!) 143/71 (!) 137/58 137/66 (!) 145/63  Pulse: 67 65 67 67  Resp: 11 12 12 16   Temp:   (!) 97.4 F (36.3 C) (!) 97.4 F (36.3 C)  TempSrc:    Oral  SpO2: 100% 100% 100% 99%  Weight:      Height:        Intake/Output Summary (Last 24 hours) at 12/13/2020 1741 Last data filed at 12/13/2020 1624 Gross per 24 hour  Intake 818.09 ml  Output 220 ml  Net 598.09 ml   Filed Weights   12/09/20 2106 12/12/20 0500 12/13/20 0903  Weight: 63.5 kg 63.4 kg 63.4 kg    Examination:  GENERAL: No apparent distress.  Nontoxic. HEENT: MMM.  Vision and hearing grossly intact.  NECK: Supple.  No apparent JVD.  RESP:  No IWOB.  Fair aeration bilaterally. CVS:  RRR. Heart sounds normal.  ABD/GI/GU: BS+. Abd soft, NTND.  Clean looking laparoscopic wounds. MSK/EXT:  Moves extremities. No apparent deformity. No edema.  SKIN: no  apparent skin lesion or wound NEURO: Awake and appropriately oriented.  No apparent focal neuro deficit. PSYCH: Calm. Normal affect.   Procedures:  1/19-laparoscopic lysis of omental adhesion  Microbiology summarized: [id number] and influenza PCR nonreactive.  Assessment & Plan: Large Bowel obstruction ([place]) due to omental adhesion -Status post laparoscopic lysis of omental adhesions by general surgery. -Pain control and diet per surgery. -Continue IV fluid  Metastatic breast cancer with mets to liver, lung and bone-received chemotherapy and adjuvant radiation therapy. Follow-up with Dr. Ladona Mow as an outpatient. Lesions are larger on CT. -Per oncology.  AKI on CKD-3B/azotemia: AKI resolved -Continue gentle IV fluid -Avoid nephrotoxic meds  Prolonged QTc in the setting of hypokalemia: Hypokalemia resolved. -Optimize electrolytes -Avoid or minimize QT prolonging drugs  Hypokalemia: Resolved.  Metabolic acidosis: Likely due to IV fluid. -Continue monitoring  History of CAD: No chest pain. -Continue home meds  Hypoglycemia: Likely due to n.p.o.  Not diabetic.  Not on hypoglycemic agent. -Change fluid to D5/NS.  Essential hypertension: On HCTZ at home.  Normotensive. -Continue holding HCTZ  Body mass index is 21.25 kg/m.         DVT prophylaxis:  enoxaparin (LOVENOX) injection 40 mg Start: 12/14/20 0800 SCDs Start: 12/10/20 0233  Code Status: DNR/DNI Family Communication: Patient and/or RN. Available if any question.  Status is: Inpatient  Remains inpatient appropriate because:Unsafe d/c plan, IV treatments appropriate due to intensity of illness or inability  to take PO and Inpatient level of care appropriate due to severity of illness   Dispo: The patient is from: Home              Anticipated d/c is to: Home              Anticipated d/c date is: 3 days once cleared by general surgery              Patient currently is not medically stable to  d/c.       Consultants:  General surgery Gastroenterology Oncology   Sch Meds:  Scheduled Meds: . chlorhexidine  15 mL Mouth Rinse BID  . Chlorhexidine Gluconate Cloth  6 each Topical Daily  . [START ON 12/14/2020] enoxaparin (LOVENOX) injection  40 mg Subcutaneous Q24H  . mouth rinse  15 mL Mouth Rinse q12n4p   Continuous Infusions: . dextrose 5 % and 0.9% NaCl 75 mL/hr at 12/13/20 1624  . famotidine (PEPCID) IV Stopped (12/12/20 1022)   PRN Meds:.acetaminophen, lip balm, morphine injection, ondansetron (ZOFRAN) IV, polyvinyl alcohol  Antimicrobials: Anti-infectives (From admission, onward)   Start     Dose/Rate Route Frequency Ordered Stop   12/13/20 0930  cefoTEtan (CEFOTAN) 2 g in sodium chloride 0.9 % 100 mL IVPB        2 g 200 mL/hr over 30 Minutes Intravenous On call to O.R. 12/13/20 9147 12/13/20 1159   12/13/20 0906  sodium chloride 0.9 % with cefoTEtan (CEFOTAN) ADS Med       Note to Pharmacy: [name]   : cabinet override      12/13/20 0906 12/13/20 1231       I have personally reviewed the following labs and images: CBC: Recent Labs  Lab 12/09/20 2120 12/10/20 0326 12/11/20 0500 12/12/20 0530  WBC 10.4 9.0 7.0 6.4  HGB 14.2 12.4 11.2* 13.5  HCT 41.9 37.2 34.7* 41.6  MCV 94.6 95.6 98.0 98.6  PLT 225 198 185 228   BMP &GFR Recent Labs  Lab 12/09/20 2120 12/10/20 0326 12/11/20 0500 12/12/20 0530  NA 135 136 142 140  K 2.9* 2.7* 4.2 3.9  CL 91* 96* 112* 106  CO2 30 28 21* 19*  GLUCOSE 139* 116* 58* 59*  BUN 31* 29* 24* 30*  CREATININE 1.64* 1.51* 1.17* 1.17*  CALCIUM 10.2 8.9 7.7* 8.8*  MG  --  2.0 1.9  --   PHOS  --   --  1.8*  --    Estimated Creatinine Clearance: 39 mL/min (A) (by C-G formula based on SCr of 1.17 mg/dL (H)). Liver & Pancreas: Recent Labs  Lab 12/09/20 2120 12/10/20 0326 12/11/20 0500 12/12/20 0530  AST 55* 45* 43* 45*  ALT 28 24 20 24   ALKPHOS 129* 110 84 107  BILITOT 1.4* 0.7 1.1 1.3*  PROT 7.4 6.3*  5.1* 6.2*  ALBUMIN 4.2 3.4* 2.7* 3.5   Recent Labs  Lab 12/09/20 2120  LIPASE 19   No results for input(s): AMMONIA in the last 168 hours. Diabetic: No results for input(s): HGBA1C in the last 72 hours. Recent Labs  Lab 12/13/20 0920 12/13/20 1316 12/13/20 1511 12/13/20 1549  GLUCAP 51* 64* 66* 110*   Cardiac Enzymes: No results for input(s): CKTOTAL, CKMB, CKMBINDEX, TROPONINI in the last 168 hours. No results for input(s): PROBNP in the last 8760 hours. Coagulation Profile: Recent Labs  Lab 12/11/20 0500  INR 1.1   Thyroid Function Tests: No results for input(s): TSH, T4TOTAL, FREET4, T3FREE, THYROIDAB in the  last 72 hours. Lipid Profile: No results for input(s): CHOL, HDL, LDLCALC, TRIG, CHOLHDL, LDLDIRECT in the last 72 hours. Anemia Panel: No results for input(s): VITAMINB12, FOLATE, FERRITIN, TIBC, IRON, RETICCTPCT in the last 72 hours. Urine analysis:    Component Value Date/Time   COLORURINE YELLOW 08/16/2019 0040   APPEARANCEUR CLEAR 08/16/2019 0040   LABSPEC 1.015 08/16/2019 0040   PHURINE 5.0 08/16/2019 0040   GLUCOSEU NEGATIVE 08/16/2019 0040   HGBUR NEGATIVE 08/16/2019 0040   BILIRUBINUR NEGATIVE 08/16/2019 0040   KETONESUR NEGATIVE 08/16/2019 0040   PROTEINUR NEGATIVE 08/16/2019 0040   NITRITE NEGATIVE 08/16/2019 0040   LEUKOCYTESUR SMALL (A) 08/16/2019 0040   Sepsis Labs: Invalid input(s): PROCALCITONIN, [place]  Microbiology: Recent Results (from the past 240 hour(s))  SARS CORONAVIRUS 2 (TAT 6-24 HRS) Nasopharyngeal Nasopharyngeal Swab     Status: None   Collection Time: 12/10/20  3:26 AM   Specimen: Nasopharyngeal Swab  Result Value Ref Range Status   SARS Coronavirus 2 NEGATIVE NEGATIVE Final    Comment: (NOTE) SARS-CoV-2 target nucleic acids are NOT DETECTED.  The SARS-CoV-2 RNA is generally detectable in upper and lower respiratory specimens during the acute phase of infection. Negative results do not preclude SARS-CoV-2  infection, do not rule out co-infections with other pathogens, and should not be used as the sole basis for treatment or other patient management decisions. Negative results must be combined with clinical observations, patient history, and epidemiological information. The expected result is Negative.  Fact Sheet for Patients: SugarRoll.be  Fact Sheet for Healthcare Providers: https://www.woods-mathews.com/  This test is not yet approved or cleared by the [place] FDA and  has been authorized for detection and/or diagnosis of SARS-CoV-2 by FDA under an Emergency Use Authorization (EUA). This EUA will remain  in effect (meaning this test can be used) for the duration of the COVID-19 declaration under Se ction 564(b)(1) of the Act, 21 U.S.C. section 360bbb-3(b)(1), unless the authorization is terminated or revoked sooner.  Performed at [facility], [place] [address] [place], [place] [address]   Culture, Urine     Status: Abnormal   Collection Time: 12/10/20  3:26 AM   Specimen: Urine, Random  Result Value Ref Range Status   Specimen Description   Final    URINE, RANDOM Performed at [place] [address] [place], [place] [address]    Special Requests   Final    NONE Performed at [facility], [place] [address] [place], [place] [address]    Culture (A)  Final    >=100,000 COLONIES/mL ESCHERICHIA COLI 10,000 COLONIES/mL PROTEUS MIRABILIS    Report Status 12/13/2020 FINAL  Final   Organism ID, Bacteria ESCHERICHIA COLI (A)  Final   Organism ID, Bacteria PROTEUS MIRABILIS (A)  Final      Susceptibility   Escherichia coli - MIC*    AMPICILLIN <=2 SENSITIVE Sensitive     CEFAZOLIN <=4 SENSITIVE Sensitive     CEFEPIME <=0.12 SENSITIVE Sensitive     CEFTRIAXONE <=0.25 SENSITIVE Sensitive     CIPROFLOXACIN <=0.25 SENSITIVE Sensitive     GENTAMICIN <=1 SENSITIVE Sensitive     IMIPENEM  <=0.25 SENSITIVE Sensitive     NITROFURANTOIN <=16 SENSITIVE Sensitive     TRIMETH/SULFA <=20 SENSITIVE Sensitive     AMPICILLIN/SULBACTAM <=2 SENSITIVE Sensitive     PIP/TAZO <=4 SENSITIVE Sensitive     * >=100,000 COLONIES/mL ESCHERICHIA COLI   Proteus mirabilis - MIC*    AMPICILLIN <=2 SENSITIVE Sensitive  CEFAZOLIN <=4 SENSITIVE Sensitive     CEFEPIME <=0.12 SENSITIVE Sensitive     CEFTRIAXONE <=0.25 SENSITIVE Sensitive     CIPROFLOXACIN <=0.25 SENSITIVE Sensitive     GENTAMICIN <=1 SENSITIVE Sensitive     IMIPENEM 2 SENSITIVE Sensitive     NITROFURANTOIN 128 RESISTANT Resistant     TRIMETH/SULFA <=20 SENSITIVE Sensitive     AMPICILLIN/SULBACTAM <=2 SENSITIVE Sensitive     PIP/TAZO <=4 SENSITIVE Sensitive     * 10,000 COLONIES/mL PROTEUS MIRABILIS  Surgical pcr screen     Status: None   Collection Time: 12/13/20  7:44 AM   Specimen: Nasal Mucosa; Nasal Swab  Result Value Ref Range Status   MRSA, PCR NEGATIVE NEGATIVE Final   Staphylococcus aureus NEGATIVE NEGATIVE Final    Comment: (NOTE) The Xpert SA Assay (FDA approved for NASAL specimens in patients 47 years of age and older), is one component of a comprehensive surveillance program. It is not intended to diagnose infection nor to guide or monitor treatment. Performed at [facility], [place] [address], [place], [place] [address]     Radiology Studies: No results found.    Taye T. [place]  If 7PM-7AM, please contact night-coverage [email or web address] 12/13/2020, 5:41 PM

## 2020-12-13 NOTE — Transfer of Care (Signed)
Immediate Anesthesia Transfer of Care Note  Patient: [name]  Procedure(s) Performed: LAPAROSCOPIC LYSIS OF ADHESIONS (Abdomen)  Patient Location: PACU  Anesthesia Type:General  Level of Consciousness: awake, alert , oriented and patient cooperative  Airway & Oxygen Therapy: Patient Spontanous Breathing and Patient connected to face mask oxygen  Post-op Assessment: Report given to RN, Post -op Vital signs reviewed and stable and Patient moving all extremities  Post vital signs: Reviewed and stable  Last Vitals:  Vitals Value Taken Time  BP    Temp    Pulse    Resp    SpO2      Last Pain:  Vitals:   12/13/20 0920  TempSrc: Oral  PainSc:       Patients Stated Pain Goal: 2 (98/26/41 5830)  Complications: No complications documented.

## 2020-12-14 ENCOUNTER — Inpatient Hospital Stay (HOSPITAL_COMMUNITY): Payer: Medicare Other

## 2020-12-14 ENCOUNTER — Encounter (HOSPITAL_COMMUNITY): Payer: Self-pay | Admitting: Surgery

## 2020-12-14 DIAGNOSIS — Z515 Encounter for palliative care: Secondary | ICD-10-CM | POA: Diagnosis not present

## 2020-12-14 DIAGNOSIS — N179 Acute kidney failure, unspecified: Secondary | ICD-10-CM | POA: Diagnosis not present

## 2020-12-14 DIAGNOSIS — Z7189 Other specified counseling: Secondary | ICD-10-CM

## 2020-12-14 DIAGNOSIS — R531 Weakness: Secondary | ICD-10-CM

## 2020-12-14 DIAGNOSIS — K56691 Other complete intestinal obstruction: Secondary | ICD-10-CM | POA: Diagnosis not present

## 2020-12-14 DIAGNOSIS — R7989 Other specified abnormal findings of blood chemistry: Secondary | ICD-10-CM | POA: Diagnosis not present

## 2020-12-14 DIAGNOSIS — I2510 Atherosclerotic heart disease of native coronary artery without angina pectoris: Secondary | ICD-10-CM | POA: Diagnosis not present

## 2020-12-14 DIAGNOSIS — C50919 Malignant neoplasm of unspecified site of unspecified female breast: Secondary | ICD-10-CM | POA: Diagnosis not present

## 2020-12-14 LAB — GLUCOSE, CAPILLARY
Glucose-Capillary: 125 mg/dL — ABNORMAL HIGH (ref 70–99)
Glucose-Capillary: 207 mg/dL — ABNORMAL HIGH (ref 70–99)

## 2020-12-14 LAB — MAGNESIUM: Magnesium: 2.2 mg/dL (ref 1.7–2.4)

## 2020-12-14 LAB — RENAL FUNCTION PANEL
Albumin: 3.1 g/dL — ABNORMAL LOW (ref 3.5–5.0)
Anion gap: 11 (ref 5–15)
BUN: 21 mg/dL (ref 8–23)
CO2: 24 mmol/L (ref 22–32)
Calcium: 7.6 mg/dL — ABNORMAL LOW (ref 8.9–10.3)
Chloride: 108 mmol/L (ref 98–111)
Creatinine, Ser: 1.16 mg/dL — ABNORMAL HIGH (ref 0.44–1.00)
GFR, Estimated: 48 mL/min — ABNORMAL LOW (ref >60)
Glucose, Bld: 127 mg/dL — ABNORMAL HIGH (ref 70–99)
Phosphorus: 2 mg/dL — ABNORMAL LOW (ref 2.5–4.6)
Potassium: 3.8 mmol/L (ref 3.5–5.1)
Sodium: 143 mmol/L (ref 135–145)

## 2020-12-14 LAB — CBC
HCT: 39 % (ref 36.0–46.0)
Hemoglobin: 12.5 g/dL (ref 12.0–15.0)
MCH: 31.3 pg (ref 26.0–34.0)
MCHC: 32.1 g/dL (ref 30.0–36.0)
MCV: 97.5 fL (ref 80.0–100.0)
Platelets: 233 10*3/uL (ref 150–400)
RBC: 4 MIL/uL (ref 3.87–5.11)
RDW: 16.9 % — ABNORMAL HIGH (ref 11.5–15.5)
WBC: 4.7 10*3/uL (ref 4.0–10.5)
nRBC: 0 % (ref 0.0–0.2)

## 2020-12-14 MED ORDER — POTASSIUM PHOSPHATES 15 MMOLE/5ML IV SOLN
20.0000 mmol | Freq: Once | INTRAVENOUS | Status: AC
  Administered 2020-12-14: 20 mmol via INTRAVENOUS
  Filled 2020-12-14: qty 6.67

## 2020-12-14 NOTE — Progress Notes (Signed)
Results of abdominal xray called to Dr. Brantley Stage. No new orders received. Shahiem Bedwell, Laurel Dimmer, RN

## 2020-12-14 NOTE — Progress Notes (Signed)
PROGRESS NOTE  Wendy [name] KDX:[id number] DOB: 1941/09/27   PCP: [name], [name]  Patient is from: Home  DOA: 12/09/2020 LOS: 4  Chief complaints: Abdominal pain, nausea and emesis  Brief Narrative / Interim history: 80 y.o. 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 CT scan of the abdomen and pelvis was concerning for high-grade bowel obstruction with twisting of the colon in the left upper quadrant, general surgery was consulted, related to concern for closed-loop obstruction due to metastatic disease.  GI and oncology consulted as well.  She underwent laparoscopic lysis of omental adhesion on 12/14/2019.    Subjective: Seen and examined earlier this morning.  No major events overnight or this morning.  No complaints.  Denies pain, nausea or vomiting.  He denies chest pain or dyspnea.  Passing gas but no bowel movement yet.  Objective: Vitals:   12/14/20 0138 12/14/20 0500 12/14/20 0655 12/14/20 1010  BP: (!) 102/51  (!) 110/47 (!) 99/50  Pulse:   60 62  Resp:   18 18  Temp:    (!) 97.5 F (36.4 C)  TempSrc:    Axillary  SpO2:   99% 98%  Weight:  66.5 kg    Height:        Intake/Output Summary (Last 24 hours) at 12/14/2020 1411 Last data filed at 12/14/2020 1130 Gross per 24 hour  Intake 1015.96 ml  Output 400 ml  Net 615.96 ml   Filed Weights   12/12/20 0500 12/13/20 0903 12/14/20 0500  Weight: 63.4 kg 63.4 kg 66.5 kg    Examination:  GENERAL: No apparent distress.  Nontoxic. HEENT: MMM.  Vision and hearing grossly intact.  NECK: Supple.  No apparent JVD.  RESP:  No IWOB.  Fair aeration bilaterally. CVS:  RRR.  2/6 SEM over RUSB and LUSB. ABD/GI/GU: BS+. Abd soft, NTND.  Clear looking laparoscopic wounds. MSK/EXT:  Moves extremities. No apparent  deformity. No edema.  SKIN: no apparent skin lesion or wound NEURO: Awake, alert and oriented appropriately.  No apparent focal neuro deficit. PSYCH: Calm. Normal affect.   Procedures:  1/19-laparoscopic lysis of omental adhesion  Microbiology summarized: [id number] and influenza PCR nonreactive.  Assessment & Plan: Large Bowel obstruction (HCC) due to omental adhesion -Status post laparoscopic lysis of omental adhesions by general surgery. -Pain fairly controlled. -On clear liquid diet per surgery. -Continue IV fluid given soft blood pressures  Metastatic breast cancer with mets to liver, lung and bone-received chemotherapy and adjuvant radiation therapy. Follow-up with Dr. Ladona Mow as an outpatient. Lesions are larger on CT. -Per oncology.  AKI on CKD-3B/azotemia: AKI resolved -Continue gentle IV fluid -Avoid nephrotoxic meds  Prolonged QTc in the setting of hypokalemia:  -Optimize K and Mg. -Avoid or minimize QT prolonging drugs  Hypokalemia: Resolved.  Hypophosphatemia: P2.0. -Potassium phosphate 20 mmol x 1  Metabolic acidosis: Likely due to IV fluid.  Resolved.  History of CAD: No chest pain. -Continue home meds  Hypoglycemia: Likely due to n.p.o.  Not diabetic.  Not on hypoglycemic agent. -Continue D5/NS.  Essential hypertension: On HCTZ at home.  Normotensive. -Continue holding HCTZ  Body mass index is 22.29 kg/m.         DVT prophylaxis:  enoxaparin (LOVENOX) injection 40 mg Start: 12/14/20 0800 SCDs Start: 12/10/20 0233  Code Status: DNR/DNI Family  Communication: Updated patient's son over the phone. Status is: Inpatient  Remains inpatient appropriate because:Unsafe d/c plan, IV treatments appropriate due to intensity of illness or inability to take PO and Inpatient level of care appropriate due to severity of illness   Dispo: The patient is from: Home              Anticipated d/c is to: Home              Anticipated d/c date is: 2 days once  cleared by general surgery              Patient currently is not medically stable to d/c.       Consultants:  General surgery Gastroenterology-signed off Oncology Palliative medicine   Sch Meds:  Scheduled Meds: . chlorhexidine  15 mL Mouth Rinse BID  . Chlorhexidine Gluconate Cloth  6 each Topical Daily  . enoxaparin (LOVENOX) injection  40 mg Subcutaneous Q24H  . mouth rinse  15 mL Mouth Rinse q12n4p   Continuous Infusions: . dextrose 5 % and 0.9% NaCl 75 mL/hr at 12/14/20 0552  . famotidine (PEPCID) IV 20 mg (12/14/20 1037)  . potassium PHOSPHATE IVPB (in mmol) 20 mmol (12/14/20 0858)   PRN Meds:.acetaminophen, lip balm, morphine injection, ondansetron (ZOFRAN) IV, polyvinyl alcohol  Antimicrobials: Anti-infectives (From admission, onward)   Start     Dose/Rate Route Frequency Ordered Stop   12/13/20 0930  cefoTEtan (CEFOTAN) 2 g in sodium chloride 0.9 % 100 mL IVPB        2 g 200 mL/hr over 30 Minutes Intravenous On call to O.R. 12/13/20 4166 12/13/20 1159   12/13/20 0906  sodium chloride 0.9 % with cefoTEtan (CEFOTAN) ADS Med       Note to Pharmacy: [name]   : cabinet override      12/13/20 0906 12/13/20 1231       I have personally reviewed the following labs and images: CBC: Recent Labs  Lab 12/09/20 2120 12/10/20 0326 12/11/20 0500 12/12/20 0530 12/14/20 0604  WBC 10.4 9.0 7.0 6.4 4.7  HGB 14.2 12.4 11.2* 13.5 12.5  HCT 41.9 37.2 34.7* 41.6 39.0  MCV 94.6 95.6 98.0 98.6 97.5  PLT 225 198 185 228 233   BMP &GFR Recent Labs  Lab 12/09/20 2120 12/10/20 0326 12/11/20 0500 12/12/20 0530 12/14/20 0604  NA 135 136 142 140 143  K 2.9* 2.7* 4.2 3.9 3.8  CL 91* 96* 112* 106 108  CO2 30 28 21* 19* 24  GLUCOSE 139* 116* 58* 59* 127*  BUN 31* 29* 24* 30* 21  CREATININE 1.64* 1.51* 1.17* 1.17* 1.16*  CALCIUM 10.2 8.9 7.7* 8.8* 7.6*  MG  --  2.0 1.9  --  2.2  PHOS  --   --  1.8*  --  2.0*   Estimated Creatinine Clearance: 39.7 mL/min (A) (by  C-G formula based on SCr of 1.16 mg/dL (H)). Liver & Pancreas: Recent Labs  Lab 12/09/20 2120 12/10/20 0326 12/11/20 0500 12/12/20 0530 12/14/20 0604  AST 55* 45* 43* 45*  --   ALT 28 24 20 24   --   ALKPHOS 129* 110 84 107  --   BILITOT 1.4* 0.7 1.1 1.3*  --   PROT 7.4 6.3* 5.1* 6.2*  --   ALBUMIN 4.2 3.4* 2.7* 3.5 3.1*   Recent Labs  Lab 12/09/20 2120  LIPASE 19   No results for input(s): AMMONIA in the last 168 hours. Diabetic: No results for input(s): HGBA1C in  the last 72 hours. Recent Labs  Lab 12/13/20 0920 12/13/20 1316 12/13/20 1511 12/13/20 1549 12/14/20 0642  GLUCAP 51* 64* 66* 110* 125*   Cardiac Enzymes: No results for input(s): CKTOTAL, CKMB, CKMBINDEX, TROPONINI in the last 168 hours. No results for input(s): PROBNP in the last 8760 hours. Coagulation Profile: Recent Labs  Lab 12/11/20 0500  INR 1.1   Thyroid Function Tests: No results for input(s): TSH, T4TOTAL, FREET4, T3FREE, THYROIDAB in the last 72 hours. Lipid Profile: No results for input(s): CHOL, HDL, LDLCALC, TRIG, CHOLHDL, LDLDIRECT in the last 72 hours. Anemia Panel: No results for input(s): VITAMINB12, FOLATE, FERRITIN, TIBC, IRON, RETICCTPCT in the last 72 hours. Urine analysis:    Component Value Date/Time   COLORURINE YELLOW 08/16/2019 0040   APPEARANCEUR CLEAR 08/16/2019 0040   LABSPEC 1.015 08/16/2019 0040   PHURINE 5.0 08/16/2019 0040   GLUCOSEU NEGATIVE 08/16/2019 0040   HGBUR NEGATIVE 08/16/2019 0040   BILIRUBINUR NEGATIVE 08/16/2019 0040   KETONESUR NEGATIVE 08/16/2019 0040   PROTEINUR NEGATIVE 08/16/2019 0040   NITRITE NEGATIVE 08/16/2019 0040   LEUKOCYTESUR SMALL (A) 08/16/2019 0040   Sepsis Labs: Invalid input(s): PROCALCITONIN, [place]  Microbiology: Recent Results (from the past 240 hour(s))  SARS CORONAVIRUS 2 (TAT 6-24 HRS) Nasopharyngeal Nasopharyngeal Swab     Status: None   Collection Time: 12/10/20  3:26 AM   Specimen: Nasopharyngeal Swab  Result  Value Ref Range Status   SARS Coronavirus 2 NEGATIVE NEGATIVE Final    Comment: (NOTE) SARS-CoV-2 target nucleic acids are NOT DETECTED.  The SARS-CoV-2 RNA is generally detectable in upper and lower respiratory specimens during the acute phase of infection. Negative results do not preclude SARS-CoV-2 infection, do not rule out co-infections with other pathogens, and should not be used as the sole basis for treatment or other patient management decisions. Negative results must be combined with clinical observations, patient history, and epidemiological information. The expected result is Negative.  Fact Sheet for Patients: SugarRoll.be  Fact Sheet for Healthcare Providers: https://www.woods-mathews.com/  This test is not yet approved or cleared by the [place] FDA and  has been authorized for detection and/or diagnosis of SARS-CoV-2 by FDA under an Emergency Use Authorization (EUA). This EUA will remain  in effect (meaning this test can be used) for the duration of the COVID-19 declaration under Se ction 564(b)(1) of the Act, 21 U.S.C. section 360bbb-3(b)(1), unless the authorization is terminated or revoked sooner.  Performed at [facility], [place] [address] [place], [place] [address]   Culture, Urine     Status: Abnormal   Collection Time: 12/10/20  3:26 AM   Specimen: Urine, Random  Result Value Ref Range Status   Specimen Description   Final    URINE, RANDOM Performed at [place] [address] [place], [place] [address]    Special Requests   Final    NONE Performed at [facility], [place] [address], [place], [place] [address]    Culture (A)  Final    >=100,000 COLONIES/mL ESCHERICHIA COLI 10,000 COLONIES/mL PROTEUS MIRABILIS    Report Status 12/13/2020 FINAL  Final   Organism ID, Bacteria ESCHERICHIA COLI (A)  Final   Organism ID, Bacteria PROTEUS MIRABILIS (A)  Final       Susceptibility   Escherichia coli - MIC*    AMPICILLIN <=2 SENSITIVE Sensitive     CEFAZOLIN <=4 SENSITIVE Sensitive     CEFEPIME <=0.12 SENSITIVE Sensitive     CEFTRIAXONE <=0.25 SENSITIVE  Sensitive     CIPROFLOXACIN <=0.25 SENSITIVE Sensitive     GENTAMICIN <=1 SENSITIVE Sensitive     IMIPENEM <=0.25 SENSITIVE Sensitive     NITROFURANTOIN <=16 SENSITIVE Sensitive     TRIMETH/SULFA <=20 SENSITIVE Sensitive     AMPICILLIN/SULBACTAM <=2 SENSITIVE Sensitive     PIP/TAZO <=4 SENSITIVE Sensitive     * >=100,000 COLONIES/mL ESCHERICHIA COLI   Proteus mirabilis - MIC*    AMPICILLIN <=2 SENSITIVE Sensitive     CEFAZOLIN <=4 SENSITIVE Sensitive     CEFEPIME <=0.12 SENSITIVE Sensitive     CEFTRIAXONE <=0.25 SENSITIVE Sensitive     CIPROFLOXACIN <=0.25 SENSITIVE Sensitive     GENTAMICIN <=1 SENSITIVE Sensitive     IMIPENEM 2 SENSITIVE Sensitive     NITROFURANTOIN 128 RESISTANT Resistant     TRIMETH/SULFA <=20 SENSITIVE Sensitive     AMPICILLIN/SULBACTAM <=2 SENSITIVE Sensitive     PIP/TAZO <=4 SENSITIVE Sensitive     * 10,000 COLONIES/mL PROTEUS MIRABILIS  Surgical pcr screen     Status: None   Collection Time: 12/13/20  7:44 AM   Specimen: Nasal Mucosa; Nasal Swab  Result Value Ref Range Status   MRSA, PCR NEGATIVE NEGATIVE Final   Staphylococcus aureus NEGATIVE NEGATIVE Final    Comment: (NOTE) The Xpert SA Assay (FDA approved for NASAL specimens in patients 63 years of age and older), is one component of a comprehensive surveillance program. It is not intended to diagnose infection nor to guide or monitor treatment. Performed at [facility], [place] [address], [place], [place] [address]     Radiology Studies: DG Abd 1 View  Result Date: 12/14/2020 CLINICAL DATA:  Abdominal pain and distension postoperative EXAM: ABDOMEN - 1 VIEW COMPARISON:  December 12, 2020 FINDINGS: There is dilatation of the cecum measuring 12.7 cm. The cecum extends into the upper mid  abdomen. There is no other appreciable colonic dilatation. No small bowel dilatation. No air-fluid levels. No free air. Postoperative changes noted in the left abdomen. IMPRESSION: Cecal dilatation consistent with cecal ileus. The location of the cecum may indicate potential developing cecal volvulus. This circumstance is felt to warrant colonoscopic assessment of the cecal region. No other bowel dilatation. No free air evident. These results will be called to the ordering clinician or representative by the Radiologist Assistant, and communication documented in the PACS or [employer]. Electronically Signed   By: Lowella Grip III M.D.   On: 12/14/2020 11:04      Taye T. Roosevelt  If 7PM-7AM, please contact night-coverage [email or web address] 12/14/2020, 2:11 PM

## 2020-12-14 NOTE — Progress Notes (Signed)
1 Day Post-Op   Subjective/Chief Complaint: No N/V no flatus or BM Wants some jello    Objective: Vital signs in last 24 hours: Temp:  [97.4 F (36.3 C)-97.6 F (36.4 C)] 97.6 F (36.4 C) (01/19 2109) Pulse Rate:  [60-71] 60 (01/20 0655) Resp:  [11-20] 18 (01/20 0655) BP: (94-145)/(45-74) 110/47 (01/20 0655) SpO2:  [98 %-100 %] 99 % (01/20 0655) Weight:  [66.5 kg] 66.5 kg (01/20 0500) Last BM Date: 12/12/20  Intake/Output from previous day: 01/19 0701 - 01/20 0700 In: 1566 [I.V.:1466; IV Piggyback:100] Out: 520 [Urine:500; Blood:20] Intake/Output this shift: No intake/output data recorded.  Incision/Wound:port sites intact soft but less distended appropriate post op soreness   Lab Results:  Recent Labs    12/12/20 0530 12/14/20 0604  WBC 6.4 4.7  HGB 13.5 12.5  HCT 41.6 39.0  PLT 228 233   BMET Recent Labs    12/12/20 0530 12/14/20 0604  NA 140 143  K 3.9 3.8  CL 106 108  CO2 19* 24  GLUCOSE 59* 127*  BUN 30* 21  CREATININE 1.17* 1.16*  CALCIUM 8.8* 7.6*   PT/INR No results for input(s): LABPROT, INR in the last 72 hours. ABG No results for input(s): PHART, HCO3 in the last 72 hours.  Invalid input(s): PCO2, PO2  Studies/Results: DG Abd 1 View  Result Date: 12/12/2020 CLINICAL DATA:  Colon obstruction. EXAM: ABDOMEN - 1 VIEW COMPARISON:  12/10/2020 and prior. FINDINGS: Gas is seen within dilated proximal and mid large bowel loops. Gas-filled nondilated small bowel loops. No evidence of pneumoperitoneum. No suspicious calcific densities. Mid abdominal and pelvic surgical clips. No acute osseous abnormality. IMPRESSION: Findings consistent with large bowel obstruction. Electronically Signed   By: Primitivo Gauze M.D.   On: 12/12/2020 13:34    Anti-infectives: Anti-infectives (From admission, onward)   Start     Dose/Rate Route Frequency Ordered Stop   12/13/20 0930  cefoTEtan (CEFOTAN) 2 g in sodium chloride 0.9 % 100 mL IVPB        2 g 200  mL/hr over 30 Minutes Intravenous On call to O.R. 12/13/20 0923 12/13/20 1159   12/13/20 0906  sodium chloride 0.9 % with cefoTEtan (CEFOTAN) ADS Med       Note to Pharmacy: [name]   : cabinet override      12/13/20 0906 12/13/20 1231      Assessment/Plan: s/p Procedure(s): LAPAROSCOPIC LYSIS OF ADHESIONS Adv diet  OOB May need PT /OT  KUB   LOS: 4 days    Marcello Moores A Raneshia Derick 12/14/2020

## 2020-12-14 NOTE — Evaluation (Signed)
Physical Therapy Evaluation Patient Details Name: Wendy [name] MRN: [id number] DOB: 1941/10/24 Today's Date: 12/14/2020   History of Present Illness  80 y.o. 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 CT scan of the abdomen and pelvis was concerning for high-grade bowel obstruction with twisting of the colon in the left upper quadrant, general surgery was consulted, related to concern for closed-loop obstruction due to metastatic disease.  Pt s/p laparoscopic lysis of omental adhesion on 12/14/2019  Clinical Impression  Pt admitted with above diagnosis.  Pt currently with functional limitations due to the deficits listed below (see PT Problem List). Pt will benefit from skilled PT to increase their independence and safety with mobility to allow discharge to the venue listed below.  Pt with nausea but willing to try to mobilize today.  Pt also reporting dizziness with sitting.  Pt assisted with transfer to recliner and RN brought nausea meds.  Pt reports she would like to be able to walk again and also d/c to her own home with hospice if her family will "let her."  Pt will likely need assist/support upon d/c.      Follow Up Recommendations No PT follow up;Supervision for mobility/OOB    Equipment Recommendations  None recommended by PT    Recommendations for Other Services       Precautions / Restrictions Precautions Precautions: Fall      Mobility  Bed Mobility Overal bed mobility: Needs Assistance Bed Mobility: Supine to Sit     Supine to sit: Min assist;HOB elevated     General bed mobility comments: slight assist for trunk upright due to pain    Transfers Overall transfer level: Needs assistance Equipment used: 1 person hand held assist Transfers: Sit to/from  [facility] Sit to Stand: Min assist Stand pivot transfers: Min guard       General transfer comment: assist to rise and steady, pt declined using RW for transfer so utilized HHA and armrest of recliner to self assist  Ambulation/Gait             General Gait Details: pt too nauseated today  Stairs            Wheelchair Mobility    Modified Rankin (Stroke Patients Only)       Balance Overall balance assessment: Needs assistance         Standing balance support: Bilateral upper extremity supported   Standing balance comment: reliant on UE support today                             Pertinent Vitals/Pain Pain Assessment: Faces Faces Pain Scale: Hurts little more Pain Location: abdomen, pt c/o more of nause (RN brought nausea med) Pain Descriptors / Indicators: Sore Pain Intervention(s): Repositioned;Patient requesting pain meds-RN notified;Monitored during session    Home Living Family/patient expects to be discharged to:: Private residence Living Arrangements: Alone Available Help at Discharge: Family;Available PRN/intermittently Type of Home: House Home Access: Stairs to enter Entrance Stairs-Rails: None Entrance Stairs-Number of Steps: 1 Home Layout: One level Home Equipment: Walker - 2 wheels;Cane - single point      Prior Function Level of Independence: Independent         Comments: used to work for [employer] of [employer]  Hand Dominance        Extremity/Trunk Assessment        Lower Extremity Assessment Lower Extremity Assessment: Generalized weakness       Communication   Communication: No difficulties  Cognition Arousal/Alertness: Awake/alert Behavior During Therapy: WFL for tasks assessed/performed Overall Cognitive Status: Within Functional Limits for tasks assessed                                        General Comments      Exercises     Assessment/Plan     PT Assessment Patient needs continued PT services  PT Problem List Decreased strength;Decreased mobility;Decreased balance;Decreased activity tolerance;Decreased knowledge of use of DME       PT Treatment Interventions Gait training;DME instruction;Balance training;Stair training;Therapeutic exercise;Functional mobility training;Therapeutic activities;Patient/family education    PT Goals (Current goals can be found in the Care Plan section)  Acute Rehab PT Goals Patient Stated Goal: walk again, go home with hospice (not to her son's home) PT Goal Formulation: With patient Time For Goal Achievement: 12/28/20 Potential to Achieve Goals: Good    Frequency Min 3X/week   Barriers to discharge        Co-evaluation               AM-PAC PT "6 Clicks" Mobility  Outcome Measure Help needed turning from your back to your side while in a flat bed without using bedrails?: A Little Help needed moving from lying on your back to sitting on the side of a flat bed without using bedrails?: A Little Help needed moving to and from a bed to a chair (including a wheelchair)?: A Little Help needed standing up from a chair using your arms (e.g., wheelchair or bedside chair)?: A Little Help needed to walk in hospital room?: A Lot Help needed climbing 3-5 steps with a railing? : A Lot 6 Click Score: 16    End of Session   Activity Tolerance: Patient limited by fatigue (nausea) Patient left: in chair;with call bell/phone within reach (pt aware to call for assist back to bed) Nurse Communication: Mobility status PT Visit Diagnosis: Difficulty in walking, not elsewhere classified (R26.2)    Time: 2505-3976 PT Time Calculation (min) (ACUTE ONLY): 32 min   Charges:   PT Evaluation $PT Eval Low Complexity: 1 Low        Kati PT, DPT Acute Rehabilitation Services Pager: [phone] Office: [phone]  York Ram E 12/14/2020, 3:35 PM

## 2020-12-14 NOTE — Progress Notes (Signed)
KUB shows colon to be more decompressed than observed intraoperatively Cecum in expected location after laparoscopy as well Not surprised its dilated at this point but overall looks better  Hopefully this will resolve on its own and her exam was overall benign May need colonoscopy if it doesn't decompress Continue to follow Of note there was no signs of volvulus or ischemia during laparoscopy but given pliability of colon, will need to monitor

## 2020-12-14 NOTE — Consult Note (Signed)
Consultation Note Date: 12/14/2020   Patient Name: Wendy [name]  DOB: [birth year]  MRN: [id number]  Age / Sex: 80 y.o., female  PCP: Delorse Limber Referring Physician: Mercy Riding, MD  Reason for Consultation: Establishing goals of care  HPI/Patient Profile: 80 y.o. female  admitted on 12/09/2020    Clinical Assessment and Goals of Care: 80 year old lady who lives at home by herself, has 2 sons who live nearby, sees Dr. [name] from medical oncology for life limiting illness of left breast metastatic cancer to lungs liver and bone.  Also has underlying history of coronary artery disease stage IIIb chronic kidney disease.  Patient presented to the emergency department with abdominal pain nausea vomiting and constipation with CT abdomen showing high-grade bowel obstruction, twisting of the colon in the left upper quadrant.  General surgery was consulted and the patient underwent laparoscopic lysis of omental additions on 12-14-2019.  A palliative consultation has been requested for goals of care discussions.  Today's postop day 1.  Patient is awake alert resting in bed.  She did not get much rest overnight.  However, she is in good spirits interacts well and is her own historian.  Introduced myself and palliative care as follows:  Palliative medicine is specialized medical care for people living with serious illness. It focuses on providing relief from the symptoms and stress of a serious illness. The goal is to improve quality of life for both the patient and the family.  Goals of care: Broad aims of medical therapy in relation to the patient's values and preferences. Our aim is to provide medical care aimed at enabling patients to achieve the goals that matter most to them, given the circumstances of their particular medical situation and their constraints.   Brief life review performed.  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  Hence, she states she is very familiar with hospice philosophy of care.  Reviewed with her about scope of current hospitalization as well as her underlying serious illness of metastatic cancer.  Goals wishes and values important to the patient and family as a unit attempted to be explored.  Call placed and also discussed with patient's son Windell [place] at [phone].  See below.  NEXT OF KIN Lives by herself in [place], [place]. Has 2 sons who live nearby.  SUMMARY OF RECOMMENDATIONS   Agree with DO NOT RESUSCITATE Patient states that she has made a decision to enroll in hospice, more specifically she desires home with hospice towards the end of this hospitalization. Continue current mode of care. Discussed with patient's son over the phone about patient's current wishes, he endorses patient's wish for home-based hospice care.  There are ongoing discussions about whether the patient ought to come to one of her sons homes with hospice support versus going back to her own home with hospice towards the end of this hospitalization.  Monitor hospital course for now and overall  disease trajectory of illness.  Discussed with medical oncology as well.  Thank you for the consult.  Code Status/Advance Care Planning:  DNR    Symptom Management:      Palliative Prophylaxis:   Delirium Protocol   Psycho-social/Spiritual:   Desire for further Chaplaincy support:yes  Additional Recommendations: Education on Hospice  Prognosis:   < 3 months  Discharge Planning: Home with Hospice      Primary Diagnoses: Present on Admission: . Bowel obstruction ([place]) . Metastatic breast cancer ([place]) . CKD (chronic kidney disease), stage III ([place]) . Hypokalemia . Prolonged QT interval   I have reviewed the medical record, interviewed the patient and  family, and examined the patient. The following aspects are pertinent.  Past Medical History:  Diagnosis Date  . Arthritis   . Breast cancer dx'd 2014   left breast  . CAD (coronary artery disease)   . Hyperlipidemia   . Hypertension   . Metastasis ([place]) dx'd 12/21/17   lung, bones and liver  . Myocardial infarction ([place]) 11/22/04  . Peripheral vascular disease ([place])    atherosclerosis carotid artery-mild   Social History   Socioeconomic History  . Marital status: Divorced    Spouse name: Not on file  . Number of children: 3  . Years of education: Not on file  . Highest education level: Not on file  Occupational History    Employer: HOSPICE   Tobacco Use  . Smoking status: Former Smoker    Packs/day: 0.30    Types: Cigarettes    Quit date: 11/09/1983    Years since quitting: 37.1  . Smokeless tobacco: Never Used  Vaping Use  . Vaping Use: Never used  Substance and Sexual Activity  . Alcohol use: Not Currently  . Drug use: No  . Sexual activity: Not Currently  Other Topics Concern  . Not on file  Social History Narrative  . Not on file   Social Determinants of Health   Financial Resource Strain: Not on file  Food Insecurity: Not on file  Transportation Needs: Not on file  Physical Activity: Not on file  Stress: Not on file  Social Connections: Not on file   Family History  Problem Relation Age of Onset  . Emphysema Mother   . Heart attack Father    Scheduled Meds: . chlorhexidine  15 mL Mouth Rinse BID  . Chlorhexidine Gluconate Cloth  6 each Topical Daily  . enoxaparin (LOVENOX) injection  40 mg Subcutaneous Q24H  . mouth rinse  15 mL Mouth Rinse q12n4p   Continuous Infusions: . dextrose 5 % and 0.9% NaCl 75 mL/hr at 12/14/20 0552  . famotidine (PEPCID) IV 20 mg (12/14/20 1037)  . potassium PHOSPHATE IVPB (in mmol) 20 mmol (12/14/20 0858)   PRN Meds:.acetaminophen, lip balm, morphine injection, ondansetron (ZOFRAN) IV, polyvinyl alcohol Medications  Prior to Admission:  Prior to Admission medications   Medication Sig Start Date End Date Taking? Authorizing Provider  acetaminophen (TYLENOL) 500 MG tablet Take 1,000 mg by mouth every 6 (six) hours as needed for mild pain.   Yes [provider]  alpelisib (PIQRAY 200MG  DAILY DOSE) 200 MG Therapy Pack Take one 200mg  tablet with food at the same time daily. Swallow whole, do not crush, chew, or split. Patient taking differently: Take 200 mg by mouth every morning. Take one 200mg  tablet with food at the same time daily. Swallow whole, do not crush, chew, or split. 08/29/20  Yes [name] Lose, MD  Calcium Carb-Cholecalciferol (CALCIUM  600 + D PO) Take 1 tablet by mouth daily.   Yes [provider]  ferrous sulfate (SLOW IRON) 160 (50 Fe) MG TBCR SR tablet Take 1 tablet (160 mg total) by mouth daily. 08/04/19  Yes [name] Lose, MD  hydrochlorothiazide (MICROZIDE) 12.5 MG capsule TAKE 1 CAPSULE(12.5 MG) BY MOUTH DAILY Patient taking differently: Take 12.5 mg by mouth daily. 03/08/20  Yes Lorretta Harp, MD  Multiple Vitamin (MULTIVITAMIN WITH MINERALS) TABS tablet Take 1 tablet by mouth daily.   Yes [provider]  nitroGLYCERIN (NITROSTAT) 0.4 MG SL tablet PLACE 1 TABLET (0.4 MG TOTAL) UNDER THE TONGUE EVERY 5 (FIVE) MINUTES AS NEEDED FOR CHEST PAIN. 09/28/18  Yes [name] Lose, MD  atorvastatin (LIPITOR) 20 MG tablet TAKE 1 TABLET BY MOUTH EVERY NIGHT AT BEDTIME Patient not taking: Reported on 12/10/2020 05/01/20   [name] Lose, MD  loratadine-pseudoephedrine (CLARITIN-D 24 HOUR) 10-240 MG 24 hr tablet Take 1 tablet by mouth daily. Patient not taking: Reported on 12/10/2020 03/06/20   [name] Lose, MD  potassium chloride SA (KLOR-CON) 20 MEQ tablet TAKE 1 TABLET BY MOUTH ONCE DAILY Patient not taking: No sig reported 08/21/20   [name] Lose, MD   No Known Allergies Review of Systems Complains of mild generalized abdominal discomfort. Physical Exam Awake alert, no distress  resting in bed Abdomen with mild generalized tenderness, has bowel sounds No edema Awake alert nonfocal Regular work of breathing S1-S2  Vital Signs: BP (!) 99/50 (BP Location: Right Arm)   Pulse 62   Temp (!) 97.5 F (36.4 C) (Axillary)   Resp 18   Ht 5\' 8"  (1.727 m)   Wt 66.5 kg   SpO2 98%   BMI 22.29 kg/m  Pain Scale: 0-10   Pain Score: 0-No pain   SpO2: SpO2: 98 % O2 Device:SpO2: 98 % O2 Flow Rate: .O2 Flow Rate (L/min): 2 L/min  IO: Intake/output summary:   Intake/Output Summary (Last 24 hours) at 12/14/2020 1330 Last data filed at 12/14/2020 1130 Gross per 24 hour  Intake 1015.96 ml  Output 400 ml  Net 615.96 ml    LBM: Last BM Date: 12/12/20 Baseline Weight: Weight: 63.5 kg Most recent weight: Weight: 66.5 kg     Palliative Assessment/Data:   PPS 40%  Time In:  10 Time Out:  11 Time Total:  60  Greater than 50%  of this time was spent counseling and coordinating care related to the above assessment and plan.  Signed by: Loistine Chance, MD   Please contact Palliative Medicine Team phone at [phone] for questions and concerns.  For individual provider: See Shea Evans

## 2020-12-14 NOTE — Care Management Important Message (Signed)
Important Message  Patient Details IM Letter given to the Patient. Name: Wendy [name] MRN: [id number] Date of Birth: [birth year]   Medicare Important Message Given:  Yes     Kerin Salen 12/14/2020, 9:57 AM

## 2020-12-14 NOTE — Plan of Care (Signed)

## 2020-12-15 DIAGNOSIS — R531 Weakness: Secondary | ICD-10-CM

## 2020-12-15 DIAGNOSIS — Z515 Encounter for palliative care: Secondary | ICD-10-CM | POA: Diagnosis not present

## 2020-12-15 DIAGNOSIS — B9620 Unspecified Escherichia coli [E. coli] as the cause of diseases classified elsewhere: Secondary | ICD-10-CM

## 2020-12-15 DIAGNOSIS — Z7189 Other specified counseling: Secondary | ICD-10-CM | POA: Diagnosis not present

## 2020-12-15 DIAGNOSIS — I2510 Atherosclerotic heart disease of native coronary artery without angina pectoris: Secondary | ICD-10-CM | POA: Diagnosis not present

## 2020-12-15 DIAGNOSIS — K56691 Other complete intestinal obstruction: Secondary | ICD-10-CM | POA: Diagnosis not present

## 2020-12-15 DIAGNOSIS — N390 Urinary tract infection, site not specified: Secondary | ICD-10-CM

## 2020-12-15 DIAGNOSIS — N179 Acute kidney failure, unspecified: Secondary | ICD-10-CM | POA: Diagnosis not present

## 2020-12-15 DIAGNOSIS — R7989 Other specified abnormal findings of blood chemistry: Secondary | ICD-10-CM | POA: Diagnosis not present

## 2020-12-15 LAB — GLUCOSE, CAPILLARY: Glucose-Capillary: 110 mg/dL — ABNORMAL HIGH (ref 70–99)

## 2020-12-15 MED ORDER — OXYCODONE HCL 5 MG PO TABS: 5.0000 mg | ORAL_TABLET | ORAL | Status: DC | PRN

## 2020-12-15 MED ORDER — SODIUM CHLORIDE 0.9 % IV BOLUS
500.0000 mL | Freq: Once | INTRAVENOUS | Status: AC
  Administered 2020-12-15: 500 mL via INTRAVENOUS

## 2020-12-15 MED ORDER — SODIUM CHLORIDE 0.9 % IV SOLN
1.0000 g | INTRAVENOUS | Status: DC
  Administered 2020-12-15 – 2020-12-16 (×2): 1 g via INTRAVENOUS
  Filled 2020-12-15 (×2): qty 1

## 2020-12-15 MED ORDER — MORPHINE SULFATE (PF) 2 MG/ML IV SOLN
1.0000 mg | INTRAVENOUS | Status: DC | PRN
  Administered 2020-12-16: 1 mg via INTRAVENOUS
  Filled 2020-12-15: qty 1

## 2020-12-15 MED ORDER — SODIUM CHLORIDE 0.9 % IV SOLN: INTRAVENOUS | Status: AC

## 2020-12-15 MED ORDER — METHOCARBAMOL 500 MG PO TABS
500.0000 mg | ORAL_TABLET | Freq: Three times a day (TID) | ORAL | Status: DC
  Administered 2020-12-15 (×3): 500 mg via ORAL
  Filled 2020-12-15 (×3): qty 1

## 2020-12-15 MED ORDER — ACETAMINOPHEN 500 MG PO TABS
1000.0000 mg | ORAL_TABLET | Freq: Three times a day (TID) | ORAL | Status: DC
  Administered 2020-12-15 – 2020-12-18 (×8): 1000 mg via ORAL
  Filled 2020-12-15 (×8): qty 2

## 2020-12-15 NOTE — Evaluation (Signed)
Occupational Therapy Evaluation Patient Details Name: Wendy [name] MRN: [id number] DOB: 07-18-1941 Today's Date: 12/15/2020    History of Present Illness 80 y.o. 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 CT scan of the abdomen and pelvis was concerning for high-grade bowel obstruction with twisting of the colon in the left upper quadrant, general surgery was consulted, related to concern for closed-loop obstruction due to metastatic disease.  Pt s/p laparoscopic lysis of omental adhesion on 12/14/2019   Clinical Impression   Mrs. Wendy [name] is a 80 year old woman with above medical history who presents with generalized weakness, decreased activity tolerance, impaired balance, complaints of pain and dizziness resulting in a decline in functional abilities. Patient min assist for supine to sit and standing and mod assist to return to side of bed. Patient limited today by complaints of pain and dizziness and wanting to return to bed. Patient reports wanting to get stronger and wanting to therapy to see her "to light a fire under my butt" in order to do more to go home. Patient will benefit from skilled OT services while in hospital to improve deficits and learn compensatory strategies as needed in order to improve functional abilities.      Follow Up Recommendations  No OT follow up    Equipment Recommendations  3 in 1 bedside commode;Hospital bed    Recommendations for Other Services       Precautions / Restrictions Precautions Precautions: Fall Restrictions Weight Bearing Restrictions: No      Mobility Bed Mobility Overal bed mobility: Needs Assistance Bed Mobility: Supine to Sit;Sit to Supine     Supine to sit: Min assist;HOB elevated Sit to supine: Mod assist   General  bed mobility comments: MIn assist to transfer to side of bed for hand hold. Mod assist for LEs to return to supine and verbal cues for log roll to reduce abdominal discomfort.    Transfers Overall transfer level: Needs assistance Equipment used: None Transfers: Sit to/from Stand Sit to Stand: Min assist         General transfer comment: Min assist for steadying with standing. Able to take one large step to head of bed. Reports dizziness.    Balance Overall balance assessment: Needs assistance Sitting-balance support: Feet supported Sitting balance-Leahy Scale: Good     Standing balance support: Single extremity supported Standing balance-Leahy Scale: Fair                             ADL either performed or assessed with clinical judgement   ADL Overall ADL's : Needs assistance/impaired Eating/Feeding: Independent   Grooming: Set up;Sitting   Upper Body Bathing: Set up;Sitting   Lower Body Bathing: Moderate assistance;Sit to/from stand   Upper Body Dressing : Set up;Sitting   Lower Body Dressing: Moderate assistance;Sit to/from stand   Toilet Transfer: Minimal assistance;BSC;Stand-pivot   Toileting- Water quality scientist and Hygiene: Moderate assistance;Sit to/from stand               Vision Baseline Vision/History: Cataracts Patient Visual Report: No change from baseline       Perception     Praxis      Pertinent Vitals/Pain Pain Assessment: Faces Faces Pain Scale: Hurts little more Pain Location: generalized, abdominal pain with transfers  Pain Descriptors / Indicators: Grimacing;Sore Pain Intervention(s): Limited activity within patient's tolerance;Monitored during session     Hand Dominance Right   Extremity/Trunk Assessment Upper Extremity Assessment Upper Extremity Assessment: Overall WFL for tasks assessed   Lower Extremity Assessment Lower Extremity Assessment: Defer to PT evaluation   Cervical / Trunk Assessment Cervical /  Trunk Assessment: Normal   Communication Communication Communication: No difficulties   Cognition Arousal/Alertness: Awake/alert Behavior During Therapy: WFL for tasks assessed/performed Overall Cognitive Status: Within Functional Limits for tasks assessed                                     General Comments       Exercises     Shoulder Instructions      Home Living Family/patient expects to be discharged to:: Private residence Living Arrangements: Alone Available Help at Discharge: Family;Available PRN/intermittently Type of Home: House Home Access: Stairs to enter [employer] of Steps: 1 Entrance Stairs-Rails: None Home Layout: One level               Home Equipment: Walker - 2 wheels;Cane - single point   Additional Comments: Reports discharging to her son's house who has six steps.      Prior Functioning/Environment Level of Independence: Independent        Comments: used to work for [employer] of [employer] [facility]        OT Problem List: Decreased strength;Decreased activity tolerance;Impaired balance (sitting and/or standing);Decreased knowledge of use of DME or AE;Pain      OT Treatment/Interventions: Self-care/ADL training;Therapeutic exercise;DME and/or AE instruction;Therapeutic activities;Balance training;Patient/family education    OT Goals(Current goals can be found in the care plan section) Acute Rehab OT Goals Patient Stated Goal: get stronger, go home with hospice OT Goal Formulation: With patient Time For Goal Achievement: 12/29/20 Potential to Achieve Goals: Fair  OT Frequency: Min 2X/week   Barriers to D/C:            Co-evaluation              AM-PAC OT "6 Clicks" Daily Activity     Outcome Measure Help from another person eating meals?: None Help from another person taking care of personal grooming?: A Little Help from another person toileting, which includes using toliet, bedpan, or urinal?: A  Lot Help from another person bathing (including washing, rinsing, drying)?: A Lot Help from another person to put on and taking off regular upper body clothing?: A Little Help from another person to put on and taking off regular lower body clothing?: A Lot 6 Click Score: 16   End of Session Nurse Communication: Mobility status  Activity Tolerance: Patient limited by fatigue;Patient limited by pain Patient left: in bed;with call bell/phone within reach;with bed alarm set  OT Visit Diagnosis: Unsteadiness on feet (R26.81);Muscle weakness (generalized) (M62.81);Pain                Time: 5573-2202 OT Time Calculation (min): 25 min Charges:  OT General Charges $OT Visit: 1 Visit OT Evaluation $OT Eval Moderate Complexity: 1 Mod  Angelo Caroll, OTR/L [place]  Office [phone] Pager: [place] 12/15/2020, 11:11 AM

## 2020-12-15 NOTE — Progress Notes (Signed)
Pt states that she is more dizzy today than normal. BP 97/58. Unable to assist pt to chair or check orthostatics at this time d/t dizziness upon turning in bed. MD notified. See new orders.

## 2020-12-15 NOTE — TOC Initial Note (Addendum)
Transition of Care [facility]) - Initial/Assessment Note    Patient Details  Name: Wendy [name] MRN: [id number] Date of Birth: 05-11-1941  [facility]) CM/SW Contact:    Dessa Phi, RN Phone Number: 12/15/2020, 12:19 PM  Clinical Narrative: Spoke to patient who gave permission to talk to dtr in law Rodena Piety tel#[phone]-patient in agreement to home w/hospice services-[facility] following-patient has been accepted-dme needed:3n1, hospital bed(Hospice will coordinate dme delivery with family) Patient will stay with Don(son)/Anita(dtr n law) address:[address] [place] [address].Currently patient says she plans to d/c by family transport.Patient for transfer to 3rd fl.                DNR in shadow chart for MD signature. Rodena Piety has concerns about Oncology treatment plan-MD notified.  Expected Discharge Plan: Home w Hospice Care Barriers to Discharge: Continued Medical Work up   Patient Goals and CMS Choice Patient states their goals for this hospitalization and ongoing recovery are:: home w/hospice services CMS Medicare.gov Compare Post Acute Care list provided to:: Patient Choice offered to / list presented to : [place]  Expected Discharge Plan and Services Expected Discharge Plan: [place]   Discharge Planning Services: CM Consult Post Acute Care Choice: Hospice Living arrangements for the past 2 months: [place] Date [place]: 12/15/20 Time HH Agency Contacted: 1216 Representative spoke with at [place]: [place] Arrangements/Services Living arrangements for the past 2 months: [place] with:: Adult Children Patient language and need for interpreter reviewed:: Yes Do you feel safe going back to the place where you live?: Yes      Need for Family Participation in  Patient Care: No (Comment) Care giver support system in place?: Yes (comment)   Criminal Activity/Legal Involvement Pertinent to Current Situation/Hospitalization: No - Comment as needed  Activities of Daily Living Home Assistive Devices/Equipment: Cane (specify quad or straight),Eyeglasses,Walker (specify type) (single point cane, front wheeled walker) ADL Screening (condition at time of admission) Patient's cognitive ability adequate to safely complete daily activities?: Yes Is the patient deaf or have difficulty hearing?: No Does the patient have difficulty seeing, even when wearing glasses/contacts?: No Does the patient have difficulty concentrating, remembering, or making decisions?: No Patient able to express need for assistance with ADLs?: Yes Does the patient have difficulty dressing or bathing?: No Independently performs ADLs?: Yes (appropriate for developmental age) Does the patient have difficulty walking or climbing stairs?: Yes (secondary to weakness) Weakness of Legs: Both Weakness of Arms/Hands: None  Permission Sought/Granted Permission sought to share information with : Case Manager Permission granted to share information with : Yes, Verbal Permission Granted  Share Information with NAME: Case manager  Permission granted to share info w AGENCY: [facility] granted to share info w Relationship: Rodena Piety dtr in law(Retired Nurse) [phone]     Emotional Assessment Appearance:: Appears stated age Attitude/Demeanor/Rapport: Gracious Affect (typically observed): Accepting Orientation: : Oriented to Self,Oriented to Place,Oriented to  Time,Oriented to Situation Alcohol / Substance Use: Not Applicable Psych Involvement: No (comment)  Admission diagnosis:  Bowel obstruction (HCC) [K56.609] SBO (small bowel obstruction) ([place]) [K56.609] N&V (nausea and vomiting) [R11.2] Patient Active Problem List  Diagnosis Date Noted  .  Bowel obstruction ([place]) 12/10/2020  . Prolonged QT interval 12/10/2020  . Hypotension due to hypovolemia   . Acute blood loss anemia   . Elevated troponin   . Demand ischemia ([place])   . Gastroesophageal reflux disease   . Metastatic breast cancer ([place])   . Hypokalemia   . Symptomatic anemia 08/16/2019  . Thrombocytopenia ([place]) 12/01/2018  . Liver metastases ([place]) 11/02/2018  . Acute diastolic CHF (congestive heart failure) ([place]) 09/01/2018  . Pleural effusion 09/01/2018  . Bone metastases ([place]) 05/27/2018  . Metastatic cancer to lung ([place]) 12/22/2017  . Streptococcal bacteremia 09/24/2017  . CKD (chronic kidney disease), stage III ([place]) 09/22/2017  . Sepsis ([place]) 09/22/2017  . Lower urinary tract infectious disease 09/22/2017  . Chest pain 09/22/2017  . Left arm cellulitis 09/22/2017  . Obesity (BMI 30-39.9) 01/06/2014  . Hyperlipidemia 01/06/2014  . CAD S/P percutaneous coronary angioplasty   . Essential hypertension   . Carotid artery disease ([place])   . Malignant neoplasm of upper-outer quadrant of left breast in female, estrogen receptor positive ([place]) 10/29/2013   PCP:  [name], [name] Pharmacy:   [id number] - [name], [place] - [address] [name] [address] Phone: [phone] Fax: [place], [place] - [address] [place] [address] [place] [address] Phone: [phone] Fax: [phone]  [facility], [place] [address] [place] [address] Phone: [phone] Fax: [place], [place] [address] Phone: [phone] Fax: [phone]     Social Determinants of Health ([place]) Interventions    Readmission Risk Interventions No flowsheet data  found.

## 2020-12-15 NOTE — Progress Notes (Signed)
Daily Progress Note   Patient Name: Wendy [name]       Date: 12/15/2020 DOB: 1941/08/29  Age: 80 y.o. MRN#: [id number] Attending Physician: Mercy Riding, MD Primary Care Physician: Delorse Limber Admit Date: 12/09/2020  Reason for Consultation/Follow-up: Establishing goals of care  Subjective:  seen earlier this am, resting in bed, no distress.   Length of Stay: 5  Current Medications: Scheduled Meds:  . acetaminophen  1,000 mg Oral Q8H  . chlorhexidine  15 mL Mouth Rinse BID  . Chlorhexidine Gluconate Cloth  6 each Topical Daily  . enoxaparin (LOVENOX) injection  40 mg Subcutaneous Q24H  . mouth rinse  15 mL Mouth Rinse q12n4p  . methocarbamol  500 mg Oral TID    Continuous Infusions: . sodium chloride 75 mL/hr at 12/15/20 1216  . cefTRIAXone (ROCEPHIN)  IV 1 g (12/15/20 1218)  . famotidine (PEPCID) IV 20 mg (12/15/20 0859)    PRN Meds: acetaminophen, lip balm, morphine injection, ondansetron (ZOFRAN) IV, oxyCODONE, polyvinyl alcohol  Physical Exam         Resting in bed Regular work of breathing  Vital Signs: BP (!) 97/58 (BP Location: Right Arm)   Pulse 62   Temp (!) 96.5 F (35.8 C) (Axillary)   Resp 20   Ht 5\' 8"  (1.727 m)   Wt 70.5 kg   SpO2 97%   BMI 23.63 kg/m  SpO2: SpO2: 97 % O2 Device: O2 Device: Room Air O2 Flow Rate: O2 Flow Rate (L/min): 2 L/min  Intake/output summary:   Intake/Output Summary (Last 24 hours) at 12/15/2020 1248 Last data filed at 12/15/2020 0700 Gross per 24 hour  Intake 1913.56 ml  Output 390 ml  Net 1523.56 ml   LBM: Last BM Date: 12/12/20 Baseline Weight: Weight: 63.5 kg Most recent weight: Weight: 70.5 kg       Palliative Assessment/Data:      Patient Active Problem List   Diagnosis Date Noted  .  Bowel obstruction ([place]) 12/10/2020  . Prolonged QT interval 12/10/2020  . Hypotension due to hypovolemia   . Acute blood loss anemia   . Elevated troponin   . Demand ischemia ([place])   . Gastroesophageal reflux disease   . Metastatic breast cancer ([place])   . Hypokalemia   . Symptomatic anemia 08/16/2019  .  Thrombocytopenia ([place]) 12/01/2018  . Liver metastases ([place]) 11/02/2018  . Acute diastolic CHF (congestive heart failure) ([place]) 09/01/2018  . Pleural effusion 09/01/2018  . Bone metastases ([place]) 05/27/2018  . Metastatic cancer to lung ([place]) 12/22/2017  . Streptococcal bacteremia 09/24/2017  . CKD (chronic kidney disease), stage III ([place]) 09/22/2017  . Sepsis ([place]) 09/22/2017  . Lower urinary tract infectious disease 09/22/2017  . Chest pain 09/22/2017  . Left arm cellulitis 09/22/2017  . Obesity (BMI 30-39.9) 01/06/2014  . Hyperlipidemia 01/06/2014  . CAD S/P percutaneous coronary angioplasty   . Essential hypertension   . Carotid artery disease ([place])   . Malignant neoplasm of upper-outer quadrant of left breast in female, estrogen receptor positive ([place]) 10/29/2013    Palliative Care Assessment & Plan   Patient Profile:  80 year old lady who lives at home by herself, has 2 sons who live nearby, sees Dr. [name] from medical oncology for life limiting illness of left breast metastatic cancer to lungs liver and bone.  Also has underlying history of coronary artery disease stage IIIb chronic kidney disease.  Patient presented to the emergency department with abdominal pain nausea vomiting and constipation with CT abdomen showing high-grade bowel obstruction, twisting of the colon in the left upper quadrant.  General surgery was consulted and the patient underwent laparoscopic lysis of omental additions on 12-14-2019.  A palliative consultation has been requested for goals of care discussions.   Recommendations/Plan:   recommend home with hospice. No new PMT specific recommendations  at this time.   Code Status:    Code Status Orders  (From admission, onward)         Start     Ordered   12/10/20 0233  Do not attempt resuscitation (DNR)  Continuous       Question Answer Comment  In the event of cardiac or respiratory ARREST Do not call a "code blue"   In the event of cardiac or respiratory ARREST Do not perform Intubation, CPR, defibrillation or ACLS   In the event of cardiac or respiratory ARREST Use medication by any route, position, wound care, and other measures to relive pain and suffering. May use oxygen, suction and manual treatment of airway obstruction as needed for comfort.      12/10/20 0233        Code Status History    Date Active Date Inactive Code Status Order ID Comments User Context   08/16/2019 0453 08/22/2019 1717 DNR [id number]  Jani Gravel, MD ED   09/23/2017 0030 09/26/2017 2040 DNR [id number]  Ivor Costa, MD ED   11/12/2013 1623 11/13/2013 1304 Full Code [id number]  Rolm Bookbinder, MD Inpatient   Advance Care Planning Activity    Advance Directive Documentation   Flowsheet Row Most Recent Value  Type of Advance Directive Healthcare Power of Attorney  Pre-existing out of facility DNR order (yellow form or pink MOST form) --  "MOST" Form in Place? --       Prognosis:   < 6 months  Discharge Planning:  Home with Hospice  Care plan was discussed with  IDT  Thank you for allowing the Palliative Medicine Team to assist in the care of this patient.   Time In: 9 Time Out: 9.15 Total Time 15 Prolonged Time Billed No.       Greater than 50%  of this time was spent counseling and coordinating care related to the above assessment and plan.  Loistine Chance, MD  Please contact Palliative Medicine Team phone at [phone]  for questions and concerns.

## 2020-12-15 NOTE — Progress Notes (Signed)
2 Days Post-Op    CC: Abdominal pain  Subjective: Patient is lying in bed pretty quiet.  She has a wick in place.  She took almost nothing yesterday for pain.  She is extremely sore but her port sites all look fine.  Abdomen is not distended, bowel sounds still hypoactive.  Objective: Vital signs in last 24 hours: Temp:  [96.5 F (35.8 C)-97.5 F (36.4 C)] 96.5 F (35.8 C) (01/21 0608) Pulse Rate:  [59-62] 59 (01/21 0608) Resp:  [16-18] 18 (01/21 0608) BP: (97-104)/(48-54) 103/50 (01/21 0608) SpO2:  [96 %-98 %] 96 % (01/21 8588) Weight:  [70.5 kg] 70.5 kg (01/21 0500) Last BM Date: 12/12/20 330 p.o. 2000 IV 390 urine recorded No other output recorded. Afebrile vital signs are stable, off nasal cannula on room air. No labs today, potassium 3.8 yesterday Intake/Output from previous day: 01/20 0701 - 01/21 0700 In: 2338.5 [P.O.:330; I.V.:1458.5; IV Piggyback:550] Out: 390 [Urine:390] Intake/Output this shift: No intake/output data recorded.  General appearance: alert, cooperative, no distress and Pretty sore and not wanting to move. Resp: clear to auscultation bilaterally and Anterior GI: Abdomen soft port sites all look fine.  Bowel sounds are hypoactive.  She did have a little bit of flatus.  Lab Results:  Recent Labs    12/14/20 0604  WBC 4.7  HGB 12.5  HCT 39.0  PLT 233    BMET Recent Labs    12/14/20 0604  NA 143  K 3.8  CL 108  CO2 24  GLUCOSE 127*  BUN 21  CREATININE 1.16*  CALCIUM 7.6*   PT/INR No results for input(s): LABPROT, INR in the last 72 hours.  Recent Labs  Lab 12/09/20 2120 12/10/20 0326 12/11/20 0500 12/12/20 0530 12/14/20 0604  AST 55* 45* 43* 45*  --   ALT 28 24 20 24   --   ALKPHOS 129* 110 84 107  --   BILITOT 1.4* 0.7 1.1 1.3*  --   PROT 7.4 6.3* 5.1* 6.2*  --   ALBUMIN 4.2 3.4* 2.7* 3.5 3.1*     Lipase     Component Value Date/Time   LIPASE 19 12/09/2020 2120     Medications: . chlorhexidine  15 mL Mouth Rinse  BID  . Chlorhexidine Gluconate Cloth  6 each Topical Daily  . enoxaparin (LOVENOX) injection  40 mg Subcutaneous Q24H  . mouth rinse  15 mL Mouth Rinse q12n4p   . dextrose 5 % and 0.9% NaCl 75 mL/hr at 12/15/20 0535  . 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 Erroll Luna, POD #2  FEN: IV fluids/clear liquid DVT: Lovenox ID: Cefotetan preop Follow-up: Dr. Brantley Stage Pain: Morphine x1 yesterday  Plan: DC wick, mobilize, put her on some scheduled pain medications.  Continue clear liquids today.  No I-S in the room; we will add I-S today.  She does not have an NSAID secondary to mildly elevated creatinine.    LOS: 5 days    Jayse Hodkinson 12/15/2020 Please see Amion

## 2020-12-15 NOTE — Progress Notes (Signed)
PROGRESS NOTE  Wendy [name] BLT:[id number] DOB: August 26, 1941   PCP: [name], [name]  Patient is from: Home  DOA: 12/09/2020 LOS: 5  Chief complaints: Abdominal pain, nausea and emesis  Brief Narrative / Interim history: 80 y.o. 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 CT scan of the abdomen and pelvis was concerning for high-grade bowel obstruction with twisting of the colon in the left upper quadrant, general surgery was consulted, related to concern for closed-loop obstruction due to metastatic disease.  GI and oncology consulted as well.  She underwent laparoscopic lysis of omental adhesion on 12/14/2019.    Subjective: Seen and examined earlier this morning.  No major events overnight of this morning.  No complaints.  She had minimal urine output, about 350 cc over the last 24 hours.  She has not been drinking much.  She also reports some burning with urination.  Urine culture grew E. Coli.  No bowel movement but passing gas.  Denies nausea or vomiting.  Objective: Vitals:   12/15/20 0158 12/15/20 0500 12/15/20 0608 12/15/20 1220  BP: (!) 104/53  (!) 103/50 (!) 97/58  Pulse: 62  (!) 59 62  Resp: 18  18 20   Temp: (!) 96.5 F (35.8 C)  (!) 96.5 F (35.8 C)   TempSrc: Axillary  Axillary   SpO2: 96%  96% 97%  Weight:  70.5 kg    Height:        Intake/Output Summary (Last 24 hours) at 12/15/2020 1227 Last data filed at 12/15/2020 0700 Gross per 24 hour  Intake 1913.56 ml  Output 390 ml  Net 1523.56 ml   Filed Weights   12/13/20 0903 12/14/20 0500 12/15/20 0500  Weight: 63.4 kg 66.5 kg 70.5 kg    Examination:  GENERAL: No apparent distress.  Nontoxic. HEENT: MMM.  Vision and hearing grossly intact.  NECK: Supple.  No apparent JVD.  RESP: On RA.  No IWOB.   Fair aeration bilaterally. CVS:  RRR.  2/6 SEM over RUSB and LUSB.   ABD/GI/GU: BS+. Abd soft.  Mild discomfort with deep palpation. MSK/EXT:  Moves extremities. No apparent deformity. No edema.  SKIN: no apparent skin lesion or wound NEURO: Awake, alert and oriented appropriately.  No apparent focal neuro deficit. PSYCH: Calm. Normal affect.  Procedures:  1/19-laparoscopic lysis of omental adhesion  Microbiology summarized: AQTMA-26 and influenza PCR nonreactive.  Assessment & Plan: Large Bowel obstruction (HCC) due to omental adhesion -S/p laparoscopic lysis of omental adhesions by general surgery. -Pain fairly controlled. -On clear liquid diet per surgery. -Change D5/NS to NS -OOB/PT/OT  Metastatic breast cancer with mets to liver, lung and bone-received chemotherapy and adjuvant radiation therapy. Follow-up with Dr. Ladona Mow as an outpatient. Lesions are larger on CT. -Per oncology.  AKI on CKD-3B/azotemia: AKI resolved but minimal urine output -Gentle IV fluid -Avoid nephrotoxic meds -Monitor urine output  E. coli UTI: Reports some dysuria.  Urine culture with E. Coli. -IV ceftriaxone pending urine culture speciation  Prolonged QTc in the setting of hypokalemia:  -Optimize K and Mg. -Avoid or minimize QT prolonging drugs  Hypokalemia: Resolved.  Hypophosphatemia: Replenished. -Recheck in the morning  Metabolic acidosis: Resolved  History of CAD: No chest pain. -Continue home meds  Hypoglycemia: Likely due to n.p.o.  Not diabetic.  Not on hypoglycemic agent.  Resolved  Essential hypertension: On HCTZ at home.  Slightly hypotensive -IV fluid as above -Continue holding HCTZ  Body mass index is 23.63 kg/m.         DVT prophylaxis:  enoxaparin (LOVENOX) injection 40 mg Start: 12/14/20 0800 SCDs Start: 12/10/20 0233  Code Status: DNR/DNI Family Communication: Updated patient's son over the phone on 1/20. Status is: Inpatient  Remains inpatient  appropriate because:Unsafe d/c plan, IV treatments appropriate due to intensity of illness or inability to take PO and Inpatient level of care appropriate due to severity of illness   Dispo: The patient is from: Home              Anticipated d/c is to: Home              Anticipated d/c date is: 2 days once cleared by general surgery              Patient currently is not medically stable to d/c.       Consultants:  General surgery  Gastroenterology-signed off Oncology Palliative medicine   Sch Meds:  Scheduled Meds:  acetaminophen  1,000 mg Oral Q8H   chlorhexidine  15 mL Mouth Rinse BID   Chlorhexidine Gluconate Cloth  6 each Topical Daily   enoxaparin (LOVENOX) injection  40 mg Subcutaneous Q24H   mouth rinse  15 mL Mouth Rinse q12n4p   methocarbamol  500 mg Oral TID   Continuous Infusions:  sodium chloride 75 mL/hr at 12/15/20 1216   cefTRIAXone (ROCEPHIN)  IV 1 g (12/15/20 1218)   famotidine (PEPCID) IV 20 mg (12/15/20 0859)   PRN Meds:.acetaminophen, lip balm, morphine injection, ondansetron (ZOFRAN) IV, oxyCODONE, polyvinyl alcohol  Antimicrobials: Anti-infectives (From admission, onward)   Start     Dose/Rate Route Frequency Ordered Stop   12/15/20 1200  cefTRIAXone (ROCEPHIN) 1 g in sodium chloride 0.9 % 100 mL IVPB        1 g 200 mL/hr over 30 Minutes Intravenous Every 24 hours 12/15/20 1026 12/20/20 1159   12/13/20 0930  cefoTEtan (CEFOTAN) 2 g in sodium chloride 0.9 % 100 mL IVPB        2 g 200 mL/hr over 30 Minutes Intravenous On call to O.R. 12/13/20 4782 12/14/20 1849   12/13/20 0906  sodium chloride 0.9 % with cefoTEtan (CEFOTAN) ADS Med       Note to Pharmacy: [name]   : cabinet override      12/13/20 0906 12/13/20 1231       I have personally reviewed the following labs and images: CBC: Recent Labs  Lab 12/09/20 2120 12/10/20 0326 12/11/20 0500 12/12/20 0530 12/14/20 0604  WBC 10.4 9.0 7.0 6.4 4.7  HGB 14.2 12.4 11.2* 13.5  12.5  HCT 41.9 37.2 34.7* 41.6 39.0  MCV 94.6 95.6 98.0 98.6 97.5  PLT 225 198 185 228 233   BMP &GFR Recent Labs  Lab 12/09/20 2120 12/10/20 0326 12/11/20 0500 12/12/20 0530 12/14/20 0604  NA 135 136 142 140 143  K 2.9* 2.7* 4.2 3.9 3.8  CL 91* 96* 112* 106 108  CO2 30 28 21* 19* 24  GLUCOSE 139* 116* 58* 59* 127*  BUN 31* 29* 24* 30* 21  CREATININE 1.64* 1.51* 1.17* 1.17* 1.16*  CALCIUM 10.2 8.9 7.7* 8.8* 7.6*  MG  --  2.0 1.9  --  2.2  PHOS  --   --  1.8*  --  2.0*   Estimated Creatinine Clearance: 39.7 mL/min (A) (by C-G  formula based on SCr of 1.16 mg/dL (H)). Liver & Pancreas: Recent Labs  Lab 12/09/20 2120 12/10/20 0326 12/11/20 0500 12/12/20 0530 12/14/20 0604  AST 55* 45* 43* 45*  --   ALT 28 24 20 24   --   ALKPHOS 129* 110 84 107  --   BILITOT 1.4* 0.7 1.1 1.3*  --   PROT 7.4 6.3* 5.1* 6.2*  --   ALBUMIN 4.2 3.4* 2.7* 3.5 3.1*   Recent Labs  Lab 12/09/20 2120  LIPASE 19   No results for input(s): AMMONIA in the last 168 hours. Diabetic: No results for input(s): HGBA1C in the last 72 hours. Recent Labs  Lab 12/13/20 1511 12/13/20 1549 12/14/20 0642 12/14/20 1604 12/15/20 0729  GLUCAP 66* 110* 125* 207* 110*   Cardiac Enzymes: No results for input(s): CKTOTAL, CKMB, CKMBINDEX, TROPONINI in the last 168 hours. No results for input(s): PROBNP in the last 8760 hours. Coagulation Profile: Recent Labs  Lab 12/11/20 0500  INR 1.1   Thyroid Function Tests: No results for input(s): TSH, T4TOTAL, FREET4, T3FREE, THYROIDAB in the last 72 hours. Lipid Profile: No results for input(s): CHOL, HDL, LDLCALC, TRIG, CHOLHDL, LDLDIRECT in the last 72 hours. Anemia Panel: No results for input(s): VITAMINB12, FOLATE, FERRITIN, TIBC, IRON, RETICCTPCT in the last 72 hours. Urine analysis:    Component Value Date/Time   COLORURINE YELLOW 08/16/2019 0040   APPEARANCEUR CLEAR 08/16/2019 0040   LABSPEC 1.015 08/16/2019 0040   PHURINE 5.0 08/16/2019 0040    GLUCOSEU NEGATIVE 08/16/2019 0040   HGBUR NEGATIVE 08/16/2019 0040   BILIRUBINUR NEGATIVE 08/16/2019 0040   KETONESUR NEGATIVE 08/16/2019 0040   PROTEINUR NEGATIVE 08/16/2019 0040   NITRITE NEGATIVE 08/16/2019 0040   LEUKOCYTESUR SMALL (A) 08/16/2019 0040   Sepsis Labs: Invalid input(s): PROCALCITONIN, Ethel  Microbiology: Recent Results (from the past 240 hour(s))  SARS CORONAVIRUS 2 (TAT 6-24 HRS) Nasopharyngeal Nasopharyngeal Swab     Status: None   Collection Time: 12/10/20  3:26 AM   Specimen: Nasopharyngeal Swab  Result Value Ref Range Status   SARS Coronavirus 2 NEGATIVE NEGATIVE Final    Comment: (NOTE) SARS-CoV-2 target nucleic acids are NOT DETECTED.  The SARS-CoV-2 RNA is generally detectable in upper and lower respiratory specimens during the acute phase of infection. Negative results do not preclude SARS-CoV-2 infection, do not rule out co-infections with other pathogens, and should not be used as the sole basis for treatment or other patient management decisions. Negative results must be combined with clinical observations, patient history, and epidemiological information. The expected result is Negative.  Fact Sheet for Patients: SugarRoll.be  Fact Sheet for Healthcare Providers: https://www.woods-mathews.com/  This test is not yet approved or cleared by the [place] FDA and  has been authorized for detection and/or diagnosis of SARS-CoV-2 by FDA under an Emergency Use Authorization (EUA). This EUA will remain  in effect (meaning this test can be used) for the duration of the COVID-19 declaration under Se ction 564(b)(1) of the Act, 21 U.S.C. section 360bbb-3(b)(1), unless the authorization is terminated or revoked sooner.  Performed at [facility], [place] [address] [place], [place] [address]   Culture, Urine     Status: Abnormal   Collection Time: 12/10/20  3:26 AM   Specimen: Urine, Random   Result Value Ref Range Status   Specimen Description   Final    URINE, RANDOM Performed at [place] [address], [place], [place] [address]    Special Requests  Final    NONE Performed at [facility], [place] [address], [place], [place] [address]    Culture (A)  Final    >=100,000 COLONIES/mL ESCHERICHIA COLI 10,000 COLONIES/mL PROTEUS MIRABILIS    Report Status 12/13/2020 FINAL  Final   Organism ID, Bacteria ESCHERICHIA COLI (A)  Final   Organism ID, Bacteria PROTEUS MIRABILIS (A)  Final      Susceptibility   Escherichia coli - MIC*    AMPICILLIN <=2 SENSITIVE Sensitive     CEFAZOLIN <=4 SENSITIVE Sensitive     CEFEPIME <=0.12 SENSITIVE Sensitive     CEFTRIAXONE <=0.25 SENSITIVE Sensitive     CIPROFLOXACIN <=0.25 SENSITIVE Sensitive     GENTAMICIN <=1 SENSITIVE Sensitive     IMIPENEM <=0.25 SENSITIVE Sensitive     NITROFURANTOIN <=16 SENSITIVE Sensitive     TRIMETH/SULFA <=20 SENSITIVE Sensitive     AMPICILLIN/SULBACTAM <=2 SENSITIVE Sensitive     PIP/TAZO <=4 SENSITIVE Sensitive     * >=100,000 COLONIES/mL ESCHERICHIA COLI   Proteus mirabilis - MIC*    AMPICILLIN <=2 SENSITIVE Sensitive     CEFAZOLIN <=4 SENSITIVE Sensitive     CEFEPIME <=0.12 SENSITIVE Sensitive     CEFTRIAXONE <=0.25 SENSITIVE Sensitive     CIPROFLOXACIN <=0.25 SENSITIVE Sensitive     GENTAMICIN <=1 SENSITIVE Sensitive     IMIPENEM 2 SENSITIVE Sensitive     NITROFURANTOIN 128 RESISTANT Resistant     TRIMETH/SULFA <=20 SENSITIVE Sensitive     AMPICILLIN/SULBACTAM <=2 SENSITIVE Sensitive     PIP/TAZO <=4 SENSITIVE Sensitive     * 10,000 COLONIES/mL PROTEUS MIRABILIS  Surgical pcr screen     Status: None   Collection Time: 12/13/20  7:44 AM   Specimen: Nasal Mucosa; Nasal Swab  Result Value Ref Range Status   MRSA, PCR NEGATIVE NEGATIVE Final   Staphylococcus aureus NEGATIVE NEGATIVE Final    Comment: (NOTE) The Xpert SA Assay (FDA approved for NASAL  specimens in patients 63 years of age and older), is one component of a comprehensive surveillance program. It is not intended to diagnose infection nor to guide or monitor treatment. Performed at [facility], [place] [address] [place], [place] [address]     Radiology Studies: No results found.    Wendy [name] T. Roland  If 7PM-7AM, please contact night-coverage [email or web address] 12/15/2020, 12:27 PM

## 2020-12-16 ENCOUNTER — Inpatient Hospital Stay (HOSPITAL_COMMUNITY): Payer: Medicare Other

## 2020-12-16 DIAGNOSIS — R7989 Other specified abnormal findings of blood chemistry: Secondary | ICD-10-CM | POA: Diagnosis not present

## 2020-12-16 DIAGNOSIS — I959 Hypotension, unspecified: Secondary | ICD-10-CM

## 2020-12-16 DIAGNOSIS — R34 Anuria and oliguria: Secondary | ICD-10-CM

## 2020-12-16 DIAGNOSIS — I2510 Atherosclerotic heart disease of native coronary artery without angina pectoris: Secondary | ICD-10-CM | POA: Diagnosis not present

## 2020-12-16 DIAGNOSIS — K56691 Other complete intestinal obstruction: Secondary | ICD-10-CM | POA: Diagnosis not present

## 2020-12-16 DIAGNOSIS — N179 Acute kidney failure, unspecified: Secondary | ICD-10-CM | POA: Diagnosis not present

## 2020-12-16 LAB — TSH
TSH: 115.436 u[IU]/mL — ABNORMAL HIGH (ref 0.350–4.500)
TSH: 102.406 u[IU]/mL — ABNORMAL HIGH (ref 0.350–4.500)

## 2020-12-16 LAB — RENAL FUNCTION PANEL
Albumin: 2.5 g/dL — ABNORMAL LOW (ref 3.5–5.0)
Anion gap: 8 (ref 5–15)
BUN: 13 mg/dL (ref 8–23)
CO2: 22 mmol/L (ref 22–32)
Calcium: 6.8 mg/dL — ABNORMAL LOW (ref 8.9–10.3)
Chloride: 111 mmol/L (ref 98–111)
Creatinine, Ser: 0.91 mg/dL (ref 0.44–1.00)
GFR, Estimated: >60 mL/min (ref >60)
Glucose, Bld: 83 mg/dL (ref 70–99)
Phosphorus: <1 mg/dL — CL (ref 2.5–4.6)
Potassium: 3.2 mmol/L — ABNORMAL LOW (ref 3.5–5.1)
Sodium: 141 mmol/L (ref 135–145)

## 2020-12-16 LAB — MAGNESIUM: Magnesium: 1.8 mg/dL (ref 1.7–2.4)

## 2020-12-16 LAB — BRAIN NATRIURETIC PEPTIDE: B Natriuretic Peptide: 139.7 pg/mL — ABNORMAL HIGH (ref 0.0–100.0)

## 2020-12-16 LAB — CBC
HCT: 35.2 % — ABNORMAL LOW (ref 36.0–46.0)
Hemoglobin: 11.7 g/dL — ABNORMAL LOW (ref 12.0–15.0)
MCH: 31.9 pg (ref 26.0–34.0)
MCHC: 33.2 g/dL (ref 30.0–36.0)
MCV: 95.9 fL (ref 80.0–100.0)
Platelets: 186 10*3/uL (ref 150–400)
RBC: 3.67 MIL/uL — ABNORMAL LOW (ref 3.87–5.11)
RDW: 17.2 % — ABNORMAL HIGH (ref 11.5–15.5)
WBC: 7.7 10*3/uL (ref 4.0–10.5)
nRBC: 0 % (ref 0.0–0.2)

## 2020-12-16 LAB — T4, FREE: Free T4: <0.25 ng/dL — ABNORMAL LOW (ref 0.61–1.12)

## 2020-12-16 LAB — CORTISOL-AM, BLOOD: Cortisol - AM: 22.2 ug/dL (ref 6.7–22.6)

## 2020-12-16 MED ORDER — FUROSEMIDE 10 MG/ML IJ SOLN
20.0000 mg | Freq: Once | INTRAMUSCULAR | Status: AC
  Administered 2020-12-16: 20 mg via INTRAVENOUS
  Filled 2020-12-16: qty 2

## 2020-12-16 MED ORDER — METHOCARBAMOL 500 MG PO TABS: 500.0000 mg | ORAL_TABLET | Freq: Three times a day (TID) | ORAL | Status: DC | PRN

## 2020-12-16 MED ORDER — LEVOTHYROXINE SODIUM 50 MCG PO TABS
50.0000 ug | ORAL_TABLET | Freq: Every day | ORAL | Status: DC
  Administered 2020-12-17 – 2020-12-18 (×2): 50 ug via ORAL
  Filled 2020-12-16 (×2): qty 1

## 2020-12-16 MED ORDER — SODIUM CHLORIDE 0.9 % IV SOLN: INTRAVENOUS | Status: DC

## 2020-12-16 MED ORDER — POLYETHYLENE GLYCOL 3350 17 G PO PACK
17.0000 g | PACK | Freq: Every day | ORAL | Status: DC
  Administered 2020-12-16: 17 g via ORAL
  Filled 2020-12-16: qty 1

## 2020-12-16 MED ORDER — CEFAZOLIN SODIUM-DEXTROSE 1-4 GM/50ML-% IV SOLN
1.0000 g | Freq: Three times a day (TID) | INTRAVENOUS | Status: DC
  Administered 2020-12-17 – 2020-12-18 (×5): 1 g via INTRAVENOUS
  Filled 2020-12-16 (×5): qty 50

## 2020-12-16 MED ORDER — OXYCODONE HCL 5 MG PO TABS: 5.0000 mg | ORAL_TABLET | Freq: Three times a day (TID) | ORAL | Status: DC | PRN

## 2020-12-16 MED ORDER — POTASSIUM CHLORIDE CRYS ER 20 MEQ PO TBCR
40.0000 meq | EXTENDED_RELEASE_TABLET | ORAL | Status: AC
  Administered 2020-12-16 (×2): 40 meq via ORAL
  Filled 2020-12-16 (×2): qty 2

## 2020-12-16 MED ORDER — FAMOTIDINE 20 MG PO TABS
20.0000 mg | ORAL_TABLET | Freq: Every day | ORAL | Status: DC
  Administered 2020-12-17 – 2020-12-18 (×2): 20 mg via ORAL
  Filled 2020-12-16 (×2): qty 1

## 2020-12-16 MED ORDER — ENSURE ENLIVE PO LIQD
237.0000 mL | Freq: Two times a day (BID) | ORAL | Status: DC
  Administered 2020-12-16 – 2020-12-18 (×2): 237 mL via ORAL

## 2020-12-16 MED ORDER — CALCIUM GLUCONATE-NACL 2-0.675 GM/100ML-% IV SOLN
2.0000 g | Freq: Once | INTRAVENOUS | Status: AC
  Administered 2020-12-16: 2000 mg via INTRAVENOUS
  Filled 2020-12-16: qty 100

## 2020-12-16 MED ORDER — POTASSIUM PHOSPHATES 15 MMOLE/5ML IV SOLN
30.0000 mmol | Freq: Once | INTRAVENOUS | Status: AC
  Administered 2020-12-16: 12:00:00 30 mmol via INTRAVENOUS
  Filled 2020-12-16: qty 10

## 2020-12-16 NOTE — Progress Notes (Signed)
PROGRESS NOTE  Wendy [name] IEP:[id number] DOB: 10-29-1941   PCP: [name], [name]  Patient is from: Home  DOA: 12/09/2020 LOS: 6  Chief complaints: Abdominal pain, nausea and emesis  Brief Narrative / Interim history: 80 y.o. 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 CT scan of the abdomen and pelvis was concerning for high-grade bowel obstruction with twisting of the colon in the left upper quadrant, general surgery was consulted, related to concern for closed-loop obstruction due to metastatic disease.  GI and oncology consulted as well.  She underwent laparoscopic lysis of omental adhesion on 12/14/2019.    Subjective: Seen and examined earlier this morning.  No major events overnight or this morning.  Still minimal urine output, about 350 cc over the last 24 hours despite IV fluid.  Also soft blood pressures.  Reports feeling lightheaded when she gets up quickly.  Some intermittent abdominal pain.  Urine appears dark in urine canister.  No nausea or vomiting.  No bowel movements yet.  Objective: Vitals:   12/15/20 2207 12/16/20 0208 12/16/20 0557 12/16/20 0937  BP: (!) 108/50 (!) 90/54 (!) 107/52 (!) 92/49  Pulse: 66 72 65 65  Resp: 18 17 18    Temp: (!) 97.3 F (36.3 C) 98.1 F (36.7 C) 97.6 F (36.4 C) (!) 97.4 F (36.3 C)  TempSrc: Oral  Oral Oral  SpO2: 97% 97% 97% 97%  Weight:   70.8 kg   Height:        Intake/Output Summary (Last 24 hours) at 12/16/2020 1159 Last data filed at 12/16/2020 0557 Gross per 24 hour  Intake 169.09 ml  Output 350 ml  Net -180.91 ml   Filed Weights   12/14/20 0500 12/15/20 0500 12/16/20 0557  Weight: 66.5 kg 70.5 kg 70.8 kg    Examination:  GENERAL: No apparent distress.  Nontoxic. HEENT: MMM.  Vision and hearing  grossly intact.  NECK: Supple.  No apparent JVD.  RESP:  No IWOB.  Fair aeration bilaterally. CVS:  RRR.  2/6 SEM over RUSB and LUSB.  RLQ tenderness. ABD/GI/GU: BS+. Abd soft, NTND.  MSK/EXT:  Moves extremities. No apparent deformity. No edema.  SKIN: no apparent skin lesion or wound NEURO: Awake, alert and oriented appropriately.  No apparent focal neuro deficit. PSYCH: Calm. Normal affect.   Procedures:  1/19-laparoscopic lysis of omental adhesion  Microbiology summarized: [id number] and influenza PCR nonreactive.  Assessment & Plan: Large Bowel obstruction (HCC) due to omental adhesion -S/p laparoscopic lysis of omental adhesions by general surgery. -Pain fairly controlled. -On clear liquid diet per surgery. -Change D5/NS to NS -OOB/PT/OT  Metastatic breast cancer with mets to liver, lung and bone-received chemotherapy and adjuvant radiation therapy. Follow-up with Dr. Ladona Mow as an outpatient. Lesions are larger on CT. -Per oncology.  AKI on CKD-3B/azotemia with oliguria: AKI resolved but oliguria despite IV fluid. -Continue IV fluid.  Per RN, bladder scan shows only 5 cc. -Renal ultrasound -I&O cath x1. -Avoid nephrotoxic meds -Monitor urine output  UTI: Reports some dysuria.  Urine culture with pansensitive E. Coli and Proteus mirabilis. -De-escalate antibiotic to Ancef.  Prolonged QTc in the setting of hypokalemia:  -Optimize K and Mg. -Avoid or minimize QT prolonging drugs  Hypokalemia/hypophosphatemia/hypocalcemia: K3.2.  P1.0. Ca 6.8 and corrects to 7.8 -K-Phos 30 mmol x 1 -K-Dur 40  mEq x 1 -IV calcium gluconate x 1 g -Repeat labs in the afternoon  Hypophosphatemia: Replenished. -Recheck in the morning  Metabolic acidosis: Resolved  History of CAD: No chest pain. -Continue home meds  Hypoglycemia: Likely due to n.p.o.  Not diabetic.  Not on hypoglycemic agent.  Resolved  Essential hypertension: On HCTZ at home.  Slightly hypotensive -IV fluid as  above -Continue holding HCTZ -Check a.m. cortisol  Elevated TSH: 115 -Repeat TSH -Free T4 and total T3  Body mass index is 23.73 kg/m.         DVT prophylaxis:  enoxaparin (LOVENOX) injection 40 mg Start: 12/14/20 0800 SCDs Start: 12/10/20 0233  Code Status: DNR/DNI Family Communication: Updated patient's son over the phone on 1/20. Status is: Inpatient  Remains inpatient appropriate because:Unsafe d/c plan, IV treatments appropriate due to intensity of illness or inability to take PO and Inpatient level of care appropriate due to severity of illness   Dispo: The patient is from: Home              Anticipated d/c is to: Home              Anticipated d/c date is: 2 days once cleared by general surgery              Patient currently is not medically stable to d/c.       Consultants:  General surgery  Gastroenterology-signed off Oncology Palliative medicine   Sch Meds:  Scheduled Meds: . acetaminophen  1,000 mg Oral Q8H  . chlorhexidine  15 mL Mouth Rinse BID  . Chlorhexidine Gluconate Cloth  6 each Topical Daily  . enoxaparin (LOVENOX) injection  40 mg Subcutaneous Q24H  . [START ON 12/17/2020] famotidine  20 mg Oral Daily  . feeding supplement  237 mL Oral BID BM  . mouth rinse  15 mL Mouth Rinse q12n4p  . potassium chloride  40 mEq Oral Q4H   Continuous Infusions: . sodium chloride 75 mL/hr at 12/16/20 1129  . sodium chloride    . cefTRIAXone (ROCEPHIN)  IV 1 g (12/16/20 1131)  . potassium PHOSPHATE IVPB (in mmol)     PRN Meds:.lip balm, methocarbamol, morphine injection, ondansetron (ZOFRAN) IV, oxyCODONE, polyvinyl alcohol  Antimicrobials: Anti-infectives (From admission, onward)   Start     Dose/Rate Route Frequency Ordered Stop   12/15/20 1200  cefTRIAXone (ROCEPHIN) 1 g in sodium chloride 0.9 % 100 mL IVPB        1 g 200 mL/hr over 30 Minutes Intravenous Every 24 hours 12/15/20 1026 12/20/20 1159   12/13/20 0930  cefoTEtan (CEFOTAN) 2 g in sodium  chloride 0.9 % 100 mL IVPB        2 g 200 mL/hr over 30 Minutes Intravenous On call to O.R. 12/13/20 4097 12/14/20 1849   12/13/20 0906  sodium chloride 0.9 % with cefoTEtan (CEFOTAN) ADS Med       Note to Pharmacy: [name]   : cabinet override      12/13/20 0906 12/13/20 1231       I have personally reviewed the following labs and images: CBC: Recent Labs  Lab 12/10/20 0326 12/11/20 0500 12/12/20 0530 12/14/20 0604 12/16/20 0608  WBC 9.0 7.0 6.4 4.7 7.7  HGB 12.4 11.2* 13.5 12.5 11.7*  HCT 37.2 34.7* 41.6 39.0 35.2*  MCV 95.6 98.0 98.6 97.5 95.9  PLT 198 185 228 233 186   BMP &GFR Recent Labs  Lab 12/10/20 0326 12/11/20 0500 12/12/20 0530 12/14/20 0604  12/16/20 0608  NA 136 142 140 143 141  K 2.7* 4.2 3.9 3.8 3.2*  CL 96* 112* 106 108 111  CO2 28 21* 19* 24 22  GLUCOSE 116* 58* 59* 127* 83  BUN 29* 24* 30* 21 13  CREATININE 1.51* 1.17* 1.17* 1.16* 0.91  CALCIUM 8.9 7.7* 8.8* 7.6* 6.8*  MG 2.0 1.9  --  2.2 1.8  PHOS  --  1.8*  --  2.0* <1.0*   Estimated Creatinine Clearance: 50.6 mL/min (by C-G formula based on SCr of 0.91 mg/dL). Liver & Pancreas: Recent Labs  Lab 12/09/20 2120 12/10/20 0326 12/11/20 0500 12/12/20 0530 12/14/20 0604 12/16/20 0608  AST 55* 45* 43* 45*  --   --   ALT 28 24 20 24   --   --   ALKPHOS 129* 110 84 107  --   --   BILITOT 1.4* 0.7 1.1 1.3*  --   --   PROT 7.4 6.3* 5.1* 6.2*  --   --   ALBUMIN 4.2 3.4* 2.7* 3.5 3.1* 2.5*   Recent Labs  Lab 12/09/20 2120  LIPASE 19   No results for input(s): AMMONIA in the last 168 hours. Diabetic: No results for input(s): HGBA1C in the last 72 hours. Recent Labs  Lab 12/13/20 1511 12/13/20 1549 12/14/20 0642 12/14/20 1604 12/15/20 0729  GLUCAP 66* 110* 125* 207* 110*   Cardiac Enzymes: No results for input(s): CKTOTAL, CKMB, CKMBINDEX, TROPONINI in the last 168 hours. No results for input(s): PROBNP in the last 8760 hours. Coagulation Profile: Recent Labs  Lab  12/11/20 0500  INR 1.1   Thyroid Function Tests: Recent Labs    12/16/20 0737  TSH 115.436*   Lipid Profile: No results for input(s): CHOL, HDL, LDLCALC, TRIG, CHOLHDL, LDLDIRECT in the last 72 hours. Anemia Panel: No results for input(s): VITAMINB12, FOLATE, FERRITIN, TIBC, IRON, RETICCTPCT in the last 72 hours. Urine analysis:    Component Value Date/Time   COLORURINE YELLOW 08/16/2019 0040   APPEARANCEUR CLEAR 08/16/2019 0040   LABSPEC 1.015 08/16/2019 0040   PHURINE 5.0 08/16/2019 0040   GLUCOSEU NEGATIVE 08/16/2019 0040   HGBUR NEGATIVE 08/16/2019 0040   BILIRUBINUR NEGATIVE 08/16/2019 0040   KETONESUR NEGATIVE 08/16/2019 0040   PROTEINUR NEGATIVE 08/16/2019 0040   NITRITE NEGATIVE 08/16/2019 0040   LEUKOCYTESUR SMALL (A) 08/16/2019 0040   Sepsis Labs: Invalid input(s): PROCALCITONIN, [place]  Microbiology: Recent Results (from the past 240 hour(s))  SARS CORONAVIRUS 2 (TAT 6-24 HRS) Nasopharyngeal Nasopharyngeal Swab     Status: None   Collection Time: 12/10/20  3:26 AM   Specimen: Nasopharyngeal Swab  Result Value Ref Range Status   SARS Coronavirus 2 NEGATIVE NEGATIVE Final    Comment: (NOTE) SARS-CoV-2 target nucleic acids are NOT DETECTED.  The SARS-CoV-2 RNA is generally detectable in upper and lower respiratory specimens during the acute phase of infection. Negative results do not preclude SARS-CoV-2 infection, do not rule out co-infections with other pathogens, and should not be used as the sole basis for treatment or other patient management decisions. Negative results must be combined with clinical observations, patient history, and epidemiological information. The expected result is Negative.  Fact Sheet for Patients: SugarRoll.be  Fact Sheet for Healthcare Providers: https://www.woods-mathews.com/  This test is not yet approved or cleared by the [place] FDA and  has been authorized for  detection and/or diagnosis of SARS-CoV-2 by FDA under an Emergency Use Authorization (EUA). This EUA will remain  in effect (meaning this test can be  used) for the duration of the COVID-19 declaration under Se ction 564(b)(1) of the Act, 21 U.S.C. section 360bbb-3(b)(1), unless the authorization is terminated or revoked sooner.  Performed at [facility], [place] [address], [place], [place] [address]   Culture, Urine     Status: Abnormal   Collection Time: 12/10/20  3:26 AM   Specimen: Urine, Random  Result Value Ref Range Status   Specimen Description   Final    URINE, RANDOM Performed at [place] [address], [place], [place] [address]    Special Requests   Final    NONE Performed at [facility], [place] [address] [place], [place] [address]    Culture (A)  Final    >=100,000 COLONIES/mL ESCHERICHIA COLI 10,000 COLONIES/mL PROTEUS MIRABILIS    Report Status 12/13/2020 FINAL  Final   Organism ID, Bacteria ESCHERICHIA COLI (A)  Final   Organism ID, Bacteria PROTEUS MIRABILIS (A)  Final      Susceptibility   Escherichia coli - MIC*    AMPICILLIN <=2 SENSITIVE Sensitive     CEFAZOLIN <=4 SENSITIVE Sensitive     CEFEPIME <=0.12 SENSITIVE Sensitive     CEFTRIAXONE <=0.25 SENSITIVE Sensitive     CIPROFLOXACIN <=0.25 SENSITIVE Sensitive     GENTAMICIN <=1 SENSITIVE Sensitive     IMIPENEM <=0.25 SENSITIVE Sensitive     NITROFURANTOIN <=16 SENSITIVE Sensitive     TRIMETH/SULFA <=20 SENSITIVE Sensitive     AMPICILLIN/SULBACTAM <=2 SENSITIVE Sensitive     PIP/TAZO <=4 SENSITIVE Sensitive     * >=100,000 COLONIES/mL ESCHERICHIA COLI   Proteus mirabilis - MIC*    AMPICILLIN <=2 SENSITIVE Sensitive     CEFAZOLIN <=4 SENSITIVE Sensitive     CEFEPIME <=0.12 SENSITIVE Sensitive     CEFTRIAXONE <=0.25 SENSITIVE Sensitive     CIPROFLOXACIN <=0.25 SENSITIVE Sensitive     GENTAMICIN <=1 SENSITIVE Sensitive     IMIPENEM 2 SENSITIVE Sensitive      NITROFURANTOIN 128 RESISTANT Resistant     TRIMETH/SULFA <=20 SENSITIVE Sensitive     AMPICILLIN/SULBACTAM <=2 SENSITIVE Sensitive     PIP/TAZO <=4 SENSITIVE Sensitive     * 10,000 COLONIES/mL PROTEUS MIRABILIS  Surgical pcr screen     Status: None   Collection Time: 12/13/20  7:44 AM   Specimen: Nasal Mucosa; Nasal Swab  Result Value Ref Range Status   MRSA, PCR NEGATIVE NEGATIVE Final   Staphylococcus aureus NEGATIVE NEGATIVE Final    Comment: (NOTE) The Xpert SA Assay (FDA approved for NASAL specimens in patients 35 years of age and older), is one component of a comprehensive surveillance program. It is not intended to diagnose infection nor to guide or monitor treatment. Performed at [facility], [place] [address], [place], [place] [address]     Radiology Studies: No results found.    Joyanne Eddinger T. [place]  If 7PM-7AM, please contact night-coverage [email or web address] 12/16/2020, 11:59 AM

## 2020-12-16 NOTE — Progress Notes (Signed)
3 Days Post-Op   Subjective/Chief Complaint: Still passing flatus, no bm, tol fulls, not oob much   Objective: Vital signs in last 24 hours: Temp:  [97.3 F (36.3 C)-98.1 F (36.7 C)] 97.6 F (36.4 C) (01/22 0557) Pulse Rate:  [59-72] 65 (01/22 0557) Resp:  [16-20] 18 (01/22 0557) BP: (90-108)/(50-67) 107/52 (01/22 0557) SpO2:  [97 %-98 %] 97 % (01/22 0557) Weight:  [70.8 kg] 70.8 kg (01/22 0557) Last BM Date: 12/12/20  Intake/Output from previous day: 01/21 0701 - 01/22 0700 In: 169.1 [I.V.:1; IV Piggyback:168.1] Out: 350 [Urine:350] Intake/Output this shift: No intake/output data recorded.  abd soft bs hypoactive approp tender incisions clean   Lab Results:  Recent Labs    12/14/20 0604 12/16/20 0608  WBC 4.7 7.7  HGB 12.5 11.7*  HCT 39.0 35.2*  PLT 233 186   BMET Recent Labs    12/14/20 0604 12/16/20 0608  NA 143 141  K 3.8 3.2*  CL 108 111  CO2 24 22  GLUCOSE 127* 83  BUN 21 13  CREATININE 1.16* 0.91  CALCIUM 7.6* 6.8*   PT/INR No results for input(s): LABPROT, INR in the last 72 hours. ABG No results for input(s): PHART, HCO3 in the last 72 hours.  Invalid input(s): PCO2, PO2  Studies/Results: DG Abd 1 View  Result Date: 12/14/2020 CLINICAL DATA:  Abdominal pain and distension postoperative EXAM: ABDOMEN - 1 VIEW COMPARISON:  December 12, 2020 FINDINGS: There is dilatation of the cecum measuring 12.7 cm. The cecum extends into the upper mid abdomen. There is no other appreciable colonic dilatation. No small bowel dilatation. No air-fluid levels. No free air. Postoperative changes noted in the left abdomen. IMPRESSION: Cecal dilatation consistent with cecal ileus. The location of the cecum may indicate potential developing cecal volvulus. This circumstance is felt to warrant colonoscopic assessment of the cecal region. No other bowel dilatation. No free air evident. These results will be called to the ordering clinician or representative by the  Radiologist Assistant, and communication documented in the PACS or [employer]. Electronically Signed   By: Lowella Grip III M.D.   On: 12/14/2020 11:04    Anti-infectives: Anti-infectives (From admission, onward)   Start     Dose/Rate Route Frequency Ordered Stop   12/15/20 1200  cefTRIAXone (ROCEPHIN) 1 g in sodium chloride 0.9 % 100 mL IVPB        1 g 200 mL/hr over 30 Minutes Intravenous Every 24 hours 12/15/20 1026 12/20/20 1159   12/13/20 0930  cefoTEtan (CEFOTAN) 2 g in sodium chloride 0.9 % 100 mL IVPB        2 g 200 mL/hr over 30 Minutes Intravenous On call to O.R. 12/13/20 0923 12/14/20 1849   12/13/20 0906  sodium chloride 0.9 % with cefoTEtan (CEFOTAN) ADS Med       Note to Pharmacy: [name]   : cabinet override      12/13/20 0906 12/13/20 1231      Assessment/Plan: POD 3 lap LOA- [name] for LBO secondary to omental adhesion -will keep on fulls today -oob as much as possible, pulm toilet -continue PT Breast cancer with metastasis to the liver, lung, and bone  - Receiving chemo/radiation therapy FEN: IV fluids/full liquid DVT: Lovenox Follow-up: Dr. Seymour Bars 12/16/2020

## 2020-12-16 NOTE — Progress Notes (Signed)
CRITICAL VALUE ALERT  Critical Value:  Phos of less than 1  Date & Time Notied:  1/22 8:47  Provider Notified: Yes  Orders Received/Actions taken: waiting on orders

## 2020-12-16 NOTE — Progress Notes (Signed)
The patient is receiving Pepcid by the intravenous route.  Based on criteria approved by the Pharmacy and [facility], the medication is being converted to the equivalent oral dose form.  These criteria include: -No active GI bleeding -Able to tolerate diet of full liquids (or better) or tube feeding -Able to tolerate other medications by the oral or enteral route  If you have any questions about this conversion, please contact the Pharmacy Department (phone [phone]).  Thank you.  Minda Ditto PharmD 12/16/2020, 11:31 AM

## 2020-12-16 NOTE — Progress Notes (Signed)
Pt called out saying she was having trouble breathing. Went in to assess, took vitals. Notified MD and he came to see her, orders were given.

## 2020-12-17 ENCOUNTER — Inpatient Hospital Stay (HOSPITAL_COMMUNITY): Payer: Medicare Other

## 2020-12-17 DIAGNOSIS — I361 Nonrheumatic tricuspid (valve) insufficiency: Secondary | ICD-10-CM

## 2020-12-17 DIAGNOSIS — I2510 Atherosclerotic heart disease of native coronary artery without angina pectoris: Secondary | ICD-10-CM | POA: Diagnosis not present

## 2020-12-17 DIAGNOSIS — I340 Nonrheumatic mitral (valve) insufficiency: Secondary | ICD-10-CM

## 2020-12-17 DIAGNOSIS — R0600 Dyspnea, unspecified: Secondary | ICD-10-CM

## 2020-12-17 DIAGNOSIS — I351 Nonrheumatic aortic (valve) insufficiency: Secondary | ICD-10-CM | POA: Diagnosis not present

## 2020-12-17 DIAGNOSIS — K56691 Other complete intestinal obstruction: Secondary | ICD-10-CM | POA: Diagnosis not present

## 2020-12-17 DIAGNOSIS — N179 Acute kidney failure, unspecified: Secondary | ICD-10-CM | POA: Diagnosis not present

## 2020-12-17 DIAGNOSIS — E039 Hypothyroidism, unspecified: Secondary | ICD-10-CM

## 2020-12-17 DIAGNOSIS — R7989 Other specified abnormal findings of blood chemistry: Secondary | ICD-10-CM | POA: Diagnosis not present

## 2020-12-17 LAB — ECHOCARDIOGRAM COMPLETE
Area-P 1/2: 2.42 cm2
Height: 68 in
S' Lateral: 1.9 cm
Weight: 2497.37 oz

## 2020-12-17 LAB — RENAL FUNCTION PANEL
Albumin: 2.3 g/dL — ABNORMAL LOW (ref 3.5–5.0)
Anion gap: 6 (ref 5–15)
BUN: 13 mg/dL (ref 8–23)
CO2: 21 mmol/L — ABNORMAL LOW (ref 22–32)
Calcium: 7.2 mg/dL — ABNORMAL LOW (ref 8.9–10.3)
Chloride: 112 mmol/L — ABNORMAL HIGH (ref 98–111)
Creatinine, Ser: 0.85 mg/dL (ref 0.44–1.00)
GFR, Estimated: >60 mL/min (ref >60)
Glucose, Bld: 73 mg/dL (ref 70–99)
Phosphorus: 1.5 mg/dL — ABNORMAL LOW (ref 2.5–4.6)
Potassium: 4 mmol/L (ref 3.5–5.1)
Sodium: 139 mmol/L (ref 135–145)

## 2020-12-17 LAB — MAGNESIUM: Magnesium: 1.6 mg/dL — ABNORMAL LOW (ref 1.7–2.4)

## 2020-12-17 MED ORDER — SODIUM CHLORIDE 0.9 % IV SOLN: INTRAVENOUS | Status: DC

## 2020-12-17 MED ORDER — MAGNESIUM SULFATE 2 GM/50ML IV SOLN
2.0000 g | Freq: Once | INTRAVENOUS | Status: AC
  Administered 2020-12-17: 08:00:00 2 g via INTRAVENOUS
  Filled 2020-12-17: qty 50

## 2020-12-17 MED ORDER — CALCIUM GLUCONATE-NACL 1-0.675 GM/50ML-% IV SOLN
1.0000 g | Freq: Once | INTRAVENOUS | Status: DC
  Filled 2020-12-17: qty 50

## 2020-12-17 MED ORDER — POLYETHYLENE GLYCOL 3350 17 G PO PACK: 17.0000 g | PACK | Freq: Every day | ORAL | Status: DC | PRN

## 2020-12-17 MED ORDER — CALCIUM GLUCONATE-NACL 1-0.675 GM/50ML-% IV SOLN
1.0000 g | Freq: Once | INTRAVENOUS | Status: AC
  Administered 2020-12-17: 18:00:00 1000 mg via INTRAVENOUS
  Filled 2020-12-17: qty 50

## 2020-12-17 MED ORDER — POTASSIUM PHOSPHATES 15 MMOLE/5ML IV SOLN
30.0000 mmol | Freq: Once | INTRAVENOUS | Status: AC
  Administered 2020-12-17: 11:00:00 30 mmol via INTRAVENOUS
  Filled 2020-12-17: qty 10

## 2020-12-17 NOTE — Progress Notes (Signed)
  Echocardiogram 2D Echocardiogram has been performed.  Jennette Dubin 12/17/2020, 12:43 PM

## 2020-12-17 NOTE — Progress Notes (Signed)
Notified Md of pt not voiding. Orders given.

## 2020-12-17 NOTE — Progress Notes (Signed)
4 Days Post-Op   Subjective/Chief Complaint: Feels better, large bm this am, tol fulls, having flatus, no n/v   Objective: Vital signs in last 24 hours: Temp:  [97.3 F (36.3 C)-97.9 F (36.6 C)] 97.6 F (36.4 C) (01/23 0550) Pulse Rate:  [65-86] 69 (01/23 0550) Resp:  [17-30] 18 (01/23 0550) BP: (90-135)/(44-70) 96/51 (01/23 0550) SpO2:  [95 %-98 %] 96 % (01/23 0550) Weight:  [70.8 kg] 70.8 kg (01/23 0550) Last BM Date: 12/16/20  Intake/Output from previous day: 01/22 0701 - 01/23 0700 In: 2451.4 [P.O.:360; I.V.:1245.2; IV Piggyback:846.2] Out: 900 [Urine:900] Intake/Output this shift: Total I/O In: 240 [P.O.:240] Out: 0   GI: soft nontender bs present incisions clean  Lab Results:  Recent Labs    12/16/20 0608  WBC 7.7  HGB 11.7*  HCT 35.2*  PLT 186   BMET Recent Labs    12/16/20 0608 12/17/20 0536  NA 141 139  K 3.2* 4.0  CL 111 112*  CO2 22 21*  GLUCOSE 83 73  BUN 13 13  CREATININE 0.91 0.85  CALCIUM 6.8* 7.2*   PT/INR No results for input(s): LABPROT, INR in the last 72 hours. ABG No results for input(s): PHART, HCO3 in the last 72 hours.  Invalid input(s): PCO2, PO2  Studies/Results: DG Chest 2 View  Result Date: 12/16/2020 CLINICAL DATA:  10 80-year-old female with shortness of breath and abdominal pain. EXAM: CHEST - 2 VIEW COMPARISON:  Earlier radiograph dated 12/16/2020. CT dated 12/10/2020. FINDINGS: There is diffuse pleuroparenchymal opacification of the left lung as seen on the earlier radiograph and corresponding to combination of left pleural effusion and left apical consolidation seen on the prior CT. A small right pleural effusion is suspected. No pneumothorax. Stable cardiac silhouette. Atherosclerotic calcification of the aorta. No acute osseous pathology. IMPRESSION: 1. Diffuse pleuroparenchymal opacification of the left lung similar to the earlier radiograph. 2. Probable small right pleural effusion. Electronically Signed   By:  Anner Crete M.D.   On: 12/16/2020 18:54   US RENAL  Result Date: 12/16/2020 CLINICAL DATA:  Oliguria.  History of left nephrectomy. EXAM: RENAL / URINARY TRACT ULTRASOUND COMPLETE COMPARISON:  CT abdomen July 28, 2020 FINDINGS: Right Kidney: Renal measurements: 11.7 x 4.4 x 4.7 cm = volume: 127 mL. Normal renal cortical thickness and echogenicity. No hydronephrosis. Possible 4 mm parenchymal calcification versus stone. Left Kidney: Surgically absent. Bladder: Decompressed with Foley catheter. Other: Heterogeneous appearance of the liver compatible with metastatic disease. Right pleural effusion. IMPRESSION: No right-sided hydronephrosis. Electronically Signed   By: Lovey Newcomer M.D.   On: 12/16/2020 13:19   DG Chest Port 1 View  Result Date: 12/16/2020 CLINICAL DATA:  Shortness of breath. History of breast cancer with metastasis. EXAM: PORTABLE CHEST 1 VIEW COMPARISON:  12/10/2020 CT. Most recent plain film of the chest of 08/16/2019. FINDINGS: Mild tracheal deviation to the left, consistent with volume loss in the left hemithorax. Normal heart size. Possible layering small right pleural effusion. Pleuroparenchymal opacification throughout the left hemithorax is slightly increased compared to 08/16/2019. Aeration in the left lung base is felt to be worsened compared to the CT of 12/10/2020, given cross modality comparison. Skin fold artifact over the lower left chest. No pneumothorax. Left upper quadrant gas is likely within the stomach. Left axillary node dissection. Left mastectomy. IMPRESSION: Chronic left hemithorax pleuroparenchymal opacification. Given cross modality comparison, aeration at the left lung base may be worsened compared to 12/10/2020. Cannot exclude superimposed atelectasis or infection. Possible layering right pleural  effusion, new since the prior CT. Consider PA and lateral radiographs if possible. Electronically Signed   By: Abigail Miyamoto M.D.   On: 12/16/2020 17:01   DG Abd  Portable 1V  Result Date: 12/16/2020 CLINICAL DATA:  80 year old female with right-sided abdominal pain. EXAM: PORTABLE ABDOMEN - 1 VIEW COMPARISON:  CT abdomen pelvis dated 12/10/2020. FINDINGS: There is gaseous distension of the stomach. There is air distension of the transverse colon and proximal descending colon as seen on the prior CT. Air is noted within several nondistended loops of small bowel. No dilated small bowel. No free air. Multiple surgical clips noted over the abdomen. There is degenerative changes of the spine. No acute osseous pathology. IMPRESSION: 1. Gaseous distension of the stomach. 2. Air within the transverse and proximal descending colon as seen on the prior CT. 3. No dilated small bowel loops. Electronically Signed   By: Anner Crete M.D.   On: 12/16/2020 17:28    Anti-infectives: Anti-infectives (From admission, onward)   Start     Dose/Rate Route Frequency Ordered Stop   12/17/20 0600  ceFAZolin (ANCEF) IVPB 1 g/50 mL premix        1 g 100 mL/hr over 30 Minutes Intravenous Every 8 hours 12/16/20 1235 12/20/20 0559   12/15/20 1200  cefTRIAXone (ROCEPHIN) 1 g in sodium chloride 0.9 % 100 mL IVPB  Status:  Discontinued        1 g 200 mL/hr over 30 Minutes Intravenous Every 24 hours 12/15/20 1026 12/16/20 1208   12/13/20 0930  cefoTEtan (CEFOTAN) 2 g in sodium chloride 0.9 % 100 mL IVPB        2 g 200 mL/hr over 30 Minutes Intravenous On call to O.R. 12/13/20 0923 12/14/20 1849   12/13/20 0906  sodium chloride 0.9 % with cefoTEtan (CEFOTAN) ADS Med       Note to Pharmacy: [name]   : cabinet override      12/13/20 0906 12/13/20 1231      Assessment/Plan: POD 4 lap LOA- [name] for LBO secondary to omental adhesion -soft diet today -oob as much as possible, pulm toilet -continue PT Breast cancer with metastasis to the liver, lung, and bone -Receiving chemo/radiation therapy FEN: soft diet DVT: Lovenox Follow-up: Dr. Seymour Bars 12/17/2020

## 2020-12-17 NOTE — Progress Notes (Signed)
PROGRESS NOTE  Wendy [name] KZL:[id number] DOB: 1941-04-28   PCP: [name], [name]  Patient is from: Home  DOA: 12/09/2020 LOS: 7  Chief complaints: Abdominal pain, nausea and emesis  Brief Narrative / Interim history: 80 y.o. 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 CT scan of the abdomen and pelvis was concerning for high-grade bowel obstruction with twisting of the colon in the left upper quadrant, general surgery was consulted, related to concern for closed-loop obstruction due to metastatic disease.  GI and oncology consulted as well.  She underwent laparoscopic lysis of omental adhesion on 12/14/2019.   Subjective: Seen and examined earlier this morning.  No major events overnight or this morning. Had brief respiratory distress yesterday that has resolved.  Chest x-ray without significant finding.  Had bowel movement and shower this morning.  Also about a liter of UOP/24 hours.  She is in a very good spirits.  Denies pain or shortness of breath.  Still with soft blood pressures but not symptomatic.   Objective: Vitals:   12/16/20 2205 12/17/20 0152 12/17/20 0550 12/17/20 1054  BP: (!) 90/44 (!) 98/50 (!) 96/51 (!) 118/55  Pulse: 69 68 69 74  Resp: 18 18 18 16   Temp: (!) 97.3 F (36.3 C) 97.6 F (36.4 C) 97.6 F (36.4 C) 97.6 F (36.4 C)  TempSrc: Oral Oral Oral Oral  SpO2: 96% 95% 96% 98%  Weight:   70.8 kg   Height:        Intake/Output Summary (Last 24 hours) at 12/17/2020 1104 Last data filed at 12/17/2020 0854 Gross per 24 hour  Intake 1696.41 ml  Output 900 ml  Net 796.41 ml   Filed Weights   12/15/20 0500 12/16/20 0557 12/17/20 0550  Weight: 70.5 kg 70.8 kg 70.8 kg    Examination:  GENERAL: No apparent distress.  Nontoxic. HEENT: MMM.  Vision  and hearing grossly intact.  NECK: Supple.  No apparent JVD.  RESP: On RA.  No IWOB.  Fair aeration bilaterally.  Rhonchi in left lung. CVS:  RRR.  2/6 SEM over RUSB and LUSB. ABD/GI/GU: BS+. Abd soft, NTND.  MSK/EXT:  Moves extremities.  Mild lymphedema in LUE. SKIN: no apparent skin lesion or wound NEURO: Awake, alert and oriented appropriately.  No apparent focal neuro deficit. PSYCH: Calm. Normal affect.   Procedures:  1/19-laparoscopic lysis of omental adhesion  Microbiology summarized: [id number] and influenza PCR nonreactive.  Assessment & Plan: Large Bowel obstruction (HCC) due to omental adhesion -S/p laparoscopic lysis of omental adhesions by general surgery. -Pain fairly controlled.  Advanced to soft diet.  IV fluid discontinued. -Change MiraLAX to as needed -OOB/PT/OT  Metastatic breast cancer with mets to liver, lung and bone-received chemotherapy and adjuvant radiation therapy. Follow-up with Dr. Ladona Mow as an outpatient. Lesions are larger on CT. -Per oncology.  AKI on CKD-3B/azotemia with oliguria inpatient weights left nephrectomy: Resolved.  About a liter urine output/24 hours.  Renal US without significant finding. -Monitor in and out -Avoid nephrotoxic meds  UTI: Reports some dysuria.  Urine culture with pansensitive E. Coli and Proteus mirabilis. -De-escalate antibiotic to Ancef.  Prolonged QTc in the setting of hypokalemia:  -Optimize K and Mg. -Avoid or minimize QT prolonging drugs  Hypokalemia/hypophosphatemia/hypocalcemia: Hypokalemia resolved.  Mg 1.6.   Ca 7.2 , and correct to  8.6. P 1.5. -K-Phos 30 mmol x 1 -IV calcium gluconate 1 g x1 -IV magnesium sulfate 2 g x 1 -Repeat labs in the afternoon  Metabolic acidosis: Resolved  History of CAD: No chest pain. -Continue home meds  Hypoglycemia: Likely due to n.p.o.  Not diabetic.  Not on hypoglycemic agent.  Resolved  Hypotension-improving.  Due to untreated hypothyroidism?  Cortisol was  normal. -Continue holding HCTZ -Started Synthroid for hypothyroidism -Check TTE  Primary hypothyroidism: TSH in 100s.  Free T4 barely detectable. -Started Synthroid 50 mcg daily -Check TSH in 3 to 4 weeks and adjust Synthroid.  Goal of care/DNR/DNI -Hospice follow-up at home per palliative medicine  Body mass index is 23.73 kg/m.         DVT prophylaxis:  enoxaparin (LOVENOX) injection 40 mg Start: 12/14/20 0800 SCDs Start: 12/10/20 0233  Code Status: DNR/DNI Family Communication: Updated patient's son over the phone Status is: Inpatient  Remains inpatient appropriate because:Persistent severe electrolyte disturbances, Ongoing diagnostic testing needed not appropriate for outpatient work up, IV treatments appropriate due to intensity of illness or inability to take PO and Inpatient level of care appropriate due to severity of illness   Dispo: The patient is from: Home              Anticipated d/c is to: Home              Anticipated d/c date is: 1 day once cleared by general surgery              Patient currently is not medically stable to d/c.       Consultants:  General surgery-following Gastroenterology-signed off Oncology Palliative medicine   Sch Meds:  Scheduled Meds: . acetaminophen  1,000 mg Oral Q8H  . chlorhexidine  15 mL Mouth Rinse BID  . Chlorhexidine Gluconate Cloth  6 each Topical Daily  . enoxaparin (LOVENOX) injection  40 mg Subcutaneous Q24H  . famotidine  20 mg Oral Daily  . feeding supplement  237 mL Oral BID BM  . levothyroxine  50 mcg Oral Q0600  . mouth rinse  15 mL Mouth Rinse q12n4p   Continuous Infusions: . calcium gluconate    .  ceFAZolin (ANCEF) IV 1 g (12/17/20 0531)  . potassium PHOSPHATE IVPB (in mmol)     PRN Meds:.lip balm, methocarbamol, morphine injection, ondansetron (ZOFRAN) IV, oxyCODONE, polyethylene glycol, polyvinyl alcohol  Antimicrobials: Anti-infectives (From admission, onward)   Start     Dose/Rate Route  Frequency Ordered Stop   12/17/20 0600  ceFAZolin (ANCEF) IVPB 1 g/50 mL premix        1 g 100 mL/hr over 30 Minutes Intravenous Every 8 hours 12/16/20 1235 12/20/20 0559   12/15/20 1200  cefTRIAXone (ROCEPHIN) 1 g in sodium chloride 0.9 % 100 mL IVPB  Status:  Discontinued        1 g 200 mL/hr over 30 Minutes Intravenous Every 24 hours 12/15/20 1026 12/16/20 1208   12/13/20 0930  cefoTEtan (CEFOTAN) 2 g in sodium chloride 0.9 % 100 mL IVPB        2 g 200 mL/hr over 30 Minutes Intravenous On call to O.R. 12/13/20 8338 12/14/20 1849   12/13/20 0906  sodium chloride 0.9 % with cefoTEtan (CEFOTAN) ADS Med       Note to Pharmacy: [name]   : cabinet override      12/13/20 0906 12/13/20 1231       I have personally reviewed the following labs and images:  CBC: Recent Labs  Lab 12/11/20 0500 12/12/20 0530 12/14/20 0604 12/16/20 0608  WBC 7.0 6.4 4.7 7.7  HGB 11.2* 13.5 12.5 11.7*  HCT 34.7* 41.6 39.0 35.2*  MCV 98.0 98.6 97.5 95.9  PLT 185 228 233 186   BMP &GFR Recent Labs  Lab 12/11/20 0500 12/12/20 0530 12/14/20 0604 12/16/20 0608 12/17/20 0536  NA 142 140 143 141 139  K 4.2 3.9 3.8 3.2* 4.0  CL 112* 106 108 111 112*  CO2 21* 19* 24 22 21*  GLUCOSE 58* 59* 127* 83 73  BUN 24* 30* 21 13 13   CREATININE 1.17* 1.17* 1.16* 0.91 0.85  CALCIUM 7.7* 8.8* 7.6* 6.8* 7.2*  MG 1.9  --  2.2 1.8 1.6*  PHOS 1.8*  --  2.0* <1.0* 1.5*   Estimated Creatinine Clearance: 54.1 mL/min (by C-G formula based on SCr of 0.85 mg/dL). Liver & Pancreas: Recent Labs  Lab 12/11/20 0500 12/12/20 0530 12/14/20 0604 12/16/20 0608 12/17/20 0536  AST 43* 45*  --   --   --   ALT 20 24  --   --   --   ALKPHOS 84 107  --   --   --   BILITOT 1.1 1.3*  --   --   --   PROT 5.1* 6.2*  --   --   --   ALBUMIN 2.7* 3.5 3.1* 2.5* 2.3*   No results for input(s): LIPASE, AMYLASE in the last 168 hours. No results for input(s): AMMONIA in the last 168 hours. Diabetic: No results for input(s):  HGBA1C in the last 72 hours. Recent Labs  Lab 12/13/20 1511 12/13/20 1549 12/14/20 0642 12/14/20 1604 12/15/20 0729  GLUCAP 66* 110* 125* 207* 110*   Cardiac Enzymes: No results for input(s): CKTOTAL, CKMB, CKMBINDEX, TROPONINI in the last 168 hours. No results for input(s): PROBNP in the last 8760 hours. Coagulation Profile: Recent Labs  Lab 12/11/20 0500  INR 1.1   Thyroid Function Tests: Recent Labs    12/16/20 1255  TSH 102.406*  FREET4 <0.25*   Lipid Profile: No results for input(s): CHOL, HDL, LDLCALC, TRIG, CHOLHDL, LDLDIRECT in the last 72 hours. Anemia Panel: No results for input(s): VITAMINB12, FOLATE, FERRITIN, TIBC, IRON, RETICCTPCT in the last 72 hours. Urine analysis:    Component Value Date/Time   COLORURINE YELLOW 08/16/2019 0040   APPEARANCEUR CLEAR 08/16/2019 0040   LABSPEC 1.015 08/16/2019 0040   PHURINE 5.0 08/16/2019 0040   GLUCOSEU NEGATIVE 08/16/2019 0040   HGBUR NEGATIVE 08/16/2019 0040   BILIRUBINUR NEGATIVE 08/16/2019 0040   KETONESUR NEGATIVE 08/16/2019 0040   PROTEINUR NEGATIVE 08/16/2019 0040   NITRITE NEGATIVE 08/16/2019 0040   LEUKOCYTESUR SMALL (A) 08/16/2019 0040   Sepsis Labs: Invalid input(s): PROCALCITONIN, [place]  Microbiology: Recent Results (from the past 240 hour(s))  SARS CORONAVIRUS 2 (TAT 6-24 HRS) Nasopharyngeal Nasopharyngeal Swab     Status: None   Collection Time: 12/10/20  3:26 AM   Specimen: Nasopharyngeal Swab  Result Value Ref Range Status   SARS Coronavirus 2 NEGATIVE NEGATIVE Final    Comment: (NOTE) SARS-CoV-2 target nucleic acids are NOT DETECTED.  The SARS-CoV-2 RNA is generally detectable in upper and lower respiratory specimens during the acute phase of infection. Negative results do not preclude SARS-CoV-2 infection, do not rule out co-infections with other pathogens, and should not be used as the sole basis for treatment or other patient management decisions. Negative results must be  combined with clinical observations, patient history, and epidemiological information. The  expected result is Negative.  Fact Sheet for Patients: SugarRoll.be  Fact Sheet for Healthcare Providers: https://www.woods-mathews.com/  This test is not yet approved or cleared by the [place] FDA and  has been authorized for detection and/or diagnosis of SARS-CoV-2 by FDA under an Emergency Use Authorization (EUA). This EUA will remain  in effect (meaning this test can be used) for the duration of the COVID-19 declaration under Se ction 564(b)(1) of the Act, 21 U.S.C. section 360bbb-3(b)(1), unless the authorization is terminated or revoked sooner.  Performed at [facility], [place] [address], [place], [place] [address]   Culture, Urine     Status: Abnormal   Collection Time: 12/10/20  3:26 AM   Specimen: Urine, Random  Result Value Ref Range Status   Specimen Description   Final    URINE, RANDOM Performed at [facility] [address] [place], [place] [address]    Special Requests   Final    NONE Performed at [facility], [place] [address], [place], [place] [address]    Culture (A)  Final    >=100,000 COLONIES/mL ESCHERICHIA COLI 10,000 COLONIES/mL PROTEUS MIRABILIS    Report Status 12/13/2020 FINAL  Final   Organism ID, Bacteria ESCHERICHIA COLI (A)  Final   Organism ID, Bacteria PROTEUS MIRABILIS (A)  Final      Susceptibility   Escherichia coli - MIC*    AMPICILLIN <=2 SENSITIVE Sensitive     CEFAZOLIN <=4 SENSITIVE Sensitive     CEFEPIME <=0.12 SENSITIVE Sensitive     CEFTRIAXONE <=0.25 SENSITIVE Sensitive     CIPROFLOXACIN <=0.25 SENSITIVE Sensitive     GENTAMICIN <=1 SENSITIVE Sensitive     IMIPENEM <=0.25 SENSITIVE Sensitive     NITROFURANTOIN <=16 SENSITIVE Sensitive     TRIMETH/SULFA <=20 SENSITIVE Sensitive     AMPICILLIN/SULBACTAM <=2 SENSITIVE Sensitive     PIP/TAZO <=4  SENSITIVE Sensitive     * >=100,000 COLONIES/mL ESCHERICHIA COLI   Proteus mirabilis - MIC*    AMPICILLIN <=2 SENSITIVE Sensitive     CEFAZOLIN <=4 SENSITIVE Sensitive     CEFEPIME <=0.12 SENSITIVE Sensitive     CEFTRIAXONE <=0.25 SENSITIVE Sensitive     CIPROFLOXACIN <=0.25 SENSITIVE Sensitive     GENTAMICIN <=1 SENSITIVE Sensitive     IMIPENEM 2 SENSITIVE Sensitive     NITROFURANTOIN 128 RESISTANT Resistant     TRIMETH/SULFA <=20 SENSITIVE Sensitive     AMPICILLIN/SULBACTAM <=2 SENSITIVE Sensitive     PIP/TAZO <=4 SENSITIVE Sensitive     * 10,000 COLONIES/mL PROTEUS MIRABILIS  Surgical pcr screen     Status: None   Collection Time: 12/13/20  7:44 AM   Specimen: Nasal Mucosa; Nasal Swab  Result Value Ref Range Status   MRSA, PCR NEGATIVE NEGATIVE Final   Staphylococcus aureus NEGATIVE NEGATIVE Final    Comment: (NOTE) The Xpert SA Assay (FDA approved for NASAL specimens in patients 64 years of age and older), is one component of a comprehensive surveillance program. It is not intended to diagnose infection nor to guide or monitor treatment. Performed at [facility], [place] [address], [place],  [address]     Radiology Studies: DG Chest 2 View  Result Date: 12/16/2020 CLINICAL DATA:  [age]-year-old female with shortness of breath and abdominal pain. EXAM: CHEST - 2 VIEW COMPARISON:  Earlier radiograph dated 12/16/2020. CT dated 12/10/2020. FINDINGS: There is diffuse pleuroparenchymal opacification of the left lung as seen on the earlier radiograph and corresponding  to combination of left pleural effusion and left apical consolidation seen on the prior CT. A small right pleural effusion is suspected. No pneumothorax. Stable cardiac silhouette. Atherosclerotic calcification of the aorta. No acute osseous pathology. IMPRESSION: 1. Diffuse pleuroparenchymal opacification of the left lung similar to the earlier radiograph. 2. Probable small right pleural  effusion. Electronically Signed   By: Anner Crete M.D.   On: 12/16/2020 18:54   US RENAL  Result Date: 12/16/2020 CLINICAL DATA:  Oliguria.  History of left nephrectomy. EXAM: RENAL / URINARY TRACT ULTRASOUND COMPLETE COMPARISON:  CT abdomen July 28, 2020 FINDINGS: Right Kidney: Renal measurements: 11.7 x 4.4 x 4.7 cm = volume: 127 mL. Normal renal cortical thickness and echogenicity. No hydronephrosis. Possible 4 mm parenchymal calcification versus stone. Left Kidney: Surgically absent. Bladder: Decompressed with Foley catheter. Other: Heterogeneous appearance of the liver compatible with metastatic disease. Right pleural effusion. IMPRESSION: No right-sided hydronephrosis. Electronically Signed   By: Lovey Newcomer M.D.   On: 12/16/2020 13:19   DG Chest Port 1 View  Result Date: 12/16/2020 CLINICAL DATA:  Shortness of breath. History of breast cancer with metastasis. EXAM: PORTABLE CHEST 1 VIEW COMPARISON:  12/10/2020 CT. Most recent plain film of the chest of 08/16/2019. FINDINGS: Mild tracheal deviation to the left, consistent with volume loss in the left hemithorax. Normal heart size. Possible layering small right pleural effusion. Pleuroparenchymal opacification throughout the left hemithorax is slightly increased compared to 08/16/2019. Aeration in the left lung base is felt to be worsened compared to the CT of 12/10/2020, given cross modality comparison. Skin fold artifact over the lower left chest. No pneumothorax. Left upper quadrant gas is likely within the stomach. Left axillary node dissection. Left mastectomy. IMPRESSION: Chronic left hemithorax pleuroparenchymal opacification. Given cross modality comparison, aeration at the left lung base may be worsened compared to 12/10/2020. Cannot exclude superimposed atelectasis or infection. Possible layering right pleural effusion, new since the prior CT. Consider PA and lateral radiographs if possible. Electronically Signed   By: Abigail Miyamoto  M.D.   On: 12/16/2020 17:01   DG Abd Portable 1V  Result Date: 12/16/2020 CLINICAL DATA:  80 year old female with right-sided abdominal pain. EXAM: PORTABLE ABDOMEN - 1 VIEW COMPARISON:  CT abdomen pelvis dated 12/10/2020. FINDINGS: There is gaseous distension of the stomach. There is air distension of the transverse colon and proximal descending colon as seen on the prior CT. Air is noted within several nondistended loops of small bowel. No dilated small bowel. No free air. Multiple surgical clips noted over the abdomen. There is degenerative changes of the spine. No acute osseous pathology. IMPRESSION: 1. Gaseous distension of the stomach. 2. Air within the transverse and proximal descending colon as seen on the prior CT. 3. No dilated small bowel loops. Electronically Signed   By: Anner Crete M.D.   On: 12/16/2020 17:28      [facility] [place]  If 7PM-7AM, please contact night-coverage [email or web address] 12/17/2020, 11:04 AM

## 2020-12-18 DIAGNOSIS — E876 Hypokalemia: Secondary | ICD-10-CM | POA: Diagnosis not present

## 2020-12-18 DIAGNOSIS — I2510 Atherosclerotic heart disease of native coronary artery without angina pectoris: Secondary | ICD-10-CM | POA: Diagnosis not present

## 2020-12-18 DIAGNOSIS — R531 Weakness: Secondary | ICD-10-CM | POA: Diagnosis not present

## 2020-12-18 DIAGNOSIS — N1832 Chronic kidney disease, stage 3b: Secondary | ICD-10-CM | POA: Diagnosis not present

## 2020-12-18 DIAGNOSIS — Z515 Encounter for palliative care: Secondary | ICD-10-CM | POA: Diagnosis not present

## 2020-12-18 DIAGNOSIS — E872 Acidosis: Secondary | ICD-10-CM

## 2020-12-18 DIAGNOSIS — Z7189 Other specified counseling: Secondary | ICD-10-CM | POA: Diagnosis not present

## 2020-12-18 DIAGNOSIS — Z66 Do not resuscitate: Secondary | ICD-10-CM

## 2020-12-18 DIAGNOSIS — K56691 Other complete intestinal obstruction: Secondary | ICD-10-CM | POA: Diagnosis not present

## 2020-12-18 LAB — RENAL FUNCTION PANEL
Albumin: 2.2 g/dL — ABNORMAL LOW (ref 3.5–5.0)
Anion gap: 5 (ref 5–15)
BUN: 11 mg/dL (ref 8–23)
CO2: 20 mmol/L — ABNORMAL LOW (ref 22–32)
Calcium: 7.2 mg/dL — ABNORMAL LOW (ref 8.9–10.3)
Chloride: 112 mmol/L — ABNORMAL HIGH (ref 98–111)
Creatinine, Ser: 0.75 mg/dL (ref 0.44–1.00)
GFR, Estimated: >60 mL/min (ref >60)
Glucose, Bld: 77 mg/dL (ref 70–99)
Phosphorus: 1.7 mg/dL — ABNORMAL LOW (ref 2.5–4.6)
Potassium: 3.9 mmol/L (ref 3.5–5.1)
Sodium: 137 mmol/L (ref 135–145)

## 2020-12-18 LAB — T3: T3, Total: 26 ng/dL — ABNORMAL LOW (ref 71–180)

## 2020-12-18 LAB — MAGNESIUM: Magnesium: 1.9 mg/dL (ref 1.7–2.4)

## 2020-12-18 LAB — CBC
HCT: 32.3 % — ABNORMAL LOW (ref 36.0–46.0)
Hemoglobin: 10.5 g/dL — ABNORMAL LOW (ref 12.0–15.0)
MCH: 31.7 pg (ref 26.0–34.0)
MCHC: 32.5 g/dL (ref 30.0–36.0)
MCV: 97.6 fL (ref 80.0–100.0)
Platelets: 175 10*3/uL (ref 150–400)
RBC: 3.31 MIL/uL — ABNORMAL LOW (ref 3.87–5.11)
RDW: 17.6 % — ABNORMAL HIGH (ref 11.5–15.5)
WBC: 7.4 10*3/uL (ref 4.0–10.5)
nRBC: 0 % (ref 0.0–0.2)

## 2020-12-18 MED ORDER — LEVOTHYROXINE SODIUM 50 MCG PO TABS: 50.0000 ug | ORAL_TABLET | Freq: Every day | ORAL | 1 refills | Status: AC

## 2020-12-18 MED ORDER — POLYETHYLENE GLYCOL 3350 17 GM/SCOOP PO POWD: 17.0000 g | Freq: Two times a day (BID) | ORAL | 0 refills | Status: AC | PRN

## 2020-12-18 MED ORDER — POTASSIUM PHOSPHATES 15 MMOLE/5ML IV SOLN
30.0000 mmol | Freq: Once | INTRAVENOUS | Status: AC
  Administered 2020-12-18: 10:00:00 30 mmol via INTRAVENOUS
  Filled 2020-12-18: qty 10

## 2020-12-18 MED ORDER — CEPHALEXIN 500 MG PO CAPS: 500.0000 mg | ORAL_CAPSULE | Freq: Three times a day (TID) | ORAL | 0 refills | Status: AC

## 2020-12-18 MED ORDER — OXYCODONE HCL 5 MG PO TABS: 5.0000 mg | ORAL_TABLET | Freq: Three times a day (TID) | ORAL | 0 refills | Status: AC | PRN

## 2020-12-18 MED ORDER — SENNOSIDES-DOCUSATE SODIUM 8.6-50 MG PO TABS: 1.0000 | ORAL_TABLET | Freq: Two times a day (BID) | ORAL | 0 refills | Status: AC | PRN

## 2020-12-18 NOTE — Progress Notes (Signed)
Daily Progress Note   Patient Name: Wendy [name]       Date: 12/18/2020 DOB: [birth year]  Age: 80 y.o. MRN#: [id number] Attending Physician: Mercy Riding, MD Primary Care Physician: Delorse Limber Admit Date: 12/09/2020  Reason for Consultation/Follow-up: Establishing goals of care  Subjective:  resting in bed, no distress. States plans are underway for home hospice arrangements.   Length of Stay: 8  Current Medications: Scheduled Meds:  . acetaminophen  1,000 mg Oral Q8H  . chlorhexidine  15 mL Mouth Rinse BID  . Chlorhexidine Gluconate Cloth  6 each Topical Daily  . enoxaparin (LOVENOX) injection  40 mg Subcutaneous Q24H  . famotidine  20 mg Oral Daily  . feeding supplement  237 mL Oral BID BM  . levothyroxine  50 mcg Oral Q0600  . mouth rinse  15 mL Mouth Rinse q12n4p    Continuous Infusions: .  ceFAZolin (ANCEF) IV 1 g (12/18/20 0601)  . potassium PHOSPHATE IVPB (in mmol) 30 mmol (12/18/20 1001)    PRN Meds: lip balm, ondansetron (ZOFRAN) IV, oxyCODONE, polyethylene glycol, polyvinyl alcohol  Physical Exam         Resting in bed Regular work of breathing  Appears weak Denies pain  Vital Signs: BP 130/66 (BP Location: Right Arm)   Pulse 66   Temp (!) 97.3 F (36.3 C) (Oral)   Resp 17   Ht 5\' 8"  (1.727 m)   Wt 70.4 kg   SpO2 98%   BMI 23.60 kg/m  SpO2: SpO2: 98 % O2 Device: O2 Device: Room Air O2 Flow Rate: O2 Flow Rate (L/min): 2 L/min  Intake/output summary:   Intake/Output Summary (Last 24 hours) at 12/18/2020 1203 Last data filed at 12/18/2020 1000 Gross per 24 hour  Intake 2712.54 ml  Output 730 ml  Net 1982.54 ml   LBM: Last BM Date: 12/17/20 Baseline Weight: Weight: 63.5 kg Most recent weight: Weight: 70.4 kg       Palliative  Assessment/Data:      Patient Active Problem List   Diagnosis Date Noted  . Palliative care by specialist   . Goals of care, counseling/discussion   . General weakness   . Bowel obstruction ([place]) 12/10/2020  . Prolonged QT interval 12/10/2020  . Hypotension due to hypovolemia   . Acute blood loss  anemia   . Elevated troponin   . Demand ischemia ([place])   . Gastroesophageal reflux disease   . Metastatic breast cancer ([place])   . Hypokalemia   . Symptomatic anemia 08/16/2019  . Thrombocytopenia ([place]) 12/01/2018  . Liver metastases ([place]) 11/02/2018  . Acute diastolic CHF (congestive heart failure) ([place]) 09/01/2018  . Pleural effusion 09/01/2018  . Bone metastases ([place]) 05/27/2018  . Metastatic cancer to lung ([place]) 12/22/2017  . Streptococcal bacteremia 09/24/2017  . CKD (chronic kidney disease), stage III ([place]) 09/22/2017  . Sepsis ([place]) 09/22/2017  . Lower urinary tract infectious disease 09/22/2017  . Chest pain 09/22/2017  . Left arm cellulitis 09/22/2017  . Obesity (BMI 30-39.9) 01/06/2014  . Hyperlipidemia 01/06/2014  . CAD S/P percutaneous coronary angioplasty   . Essential hypertension   . Carotid artery disease ([place])   . Malignant neoplasm of upper-outer quadrant of left breast in female, estrogen receptor positive ([place]) 10/29/2013    Palliative Care Assessment & Plan   Patient Profile:  80 year old lady who lives at home by herself, has 2 sons who live nearby, sees Dr. [name] from medical oncology for life limiting illness of left breast metastatic cancer to lungs liver and bone.  Also has underlying history of coronary artery disease stage IIIb chronic kidney disease.  Patient presented to the emergency department with abdominal pain nausea vomiting and constipation with CT abdomen showing high-grade bowel obstruction, twisting of the colon in the left upper quadrant.  General surgery was consulted and the patient underwent laparoscopic lysis of omental additions on  12-14-2019.  A palliative consultation has been requested for goals of care discussions.   Recommendations/Plan:   recommend home with hospice. No new PMT specific recommendations at this time.   Code Status:    Code Status Orders  (From admission, onward)         Start     Ordered   12/10/20 0233  Do not attempt resuscitation (DNR)  Continuous       Question Answer Comment  In the event of cardiac or respiratory ARREST Do not call a "code blue"   In the event of cardiac or respiratory ARREST Do not perform Intubation, CPR, defibrillation or ACLS   In the event of cardiac or respiratory ARREST Use medication by any route, position, wound care, and other measures to relive pain and suffering. May use oxygen, suction and manual treatment of airway obstruction as needed for comfort.      12/10/20 0233        Code Status History    Date Active Date Inactive Code Status Order ID Comments User Context   08/16/2019 0453 08/22/2019 1717 DNR [id number]  Jani Gravel, MD ED   09/23/2017 0030 09/26/2017 2040 DNR [id number]  Ivor Costa, MD ED   11/12/2013 1623 11/13/2013 1304 Full Code [id number]  Rolm Bookbinder, MD Inpatient   Advance Care Planning Activity    Advance Directive Documentation   Flowsheet Row Most Recent Value  Type of Advance Directive Healthcare Power of Attorney  Pre-existing out of facility DNR order (yellow form or pink MOST form) --  "MOST" Form in Place? --       Prognosis:   < 6 months  Discharge Planning:  Home with Hospice  Care plan was discussed with  Patient.   Thank you for allowing the Palliative Medicine Team to assist in the care of this patient.   Time In: 9 Time Out: 9.15 Total Time 15 Prolonged Time Billed No.  Greater than 50%  of this time was spent counseling and coordinating care related to the above assessment and plan.  Loistine Chance, MD  Please contact Palliative Medicine Team phone at [phone] for questions and concerns.

## 2020-12-18 NOTE — Discharge Instructions (Signed)
[facility] LAPAROSCOPIC SURGERY: POST OP INSTRUCTIONS Always review your discharge instruction sheet given to you by the facility where your surgery was performed. IF YOU HAVE DISABILITY OR FAMILY LEAVE FORMS, YOU MUST BRING THEM TO THE OFFICE FOR PROCESSING.   DO NOT GIVE THEM TO YOUR DOCTOR.  PAIN CONTROL  1. First take acetaminophen (Tylenol) AND/or ibuprofen (Advil) to control your pain after surgery.  Follow directions on package.  Taking acetaminophen (Tylenol) and/or ibuprofen (Advil) regularly after surgery will help to control your pain and lower the amount of prescription pain medication you may need.  You should not take more than 3,000 mg (3 grams) of acetaminophen (Tylenol) in 24 hours.  You should not take ibuprofen (Advil), aleve, motrin, naprosyn or other NSAIDS if you have a history of stomach ulcers or chronic kidney disease.  2. A prescription for pain medication may be given to you upon discharge.  Take your pain medication as prescribed, if you still have uncontrolled pain after taking acetaminophen (Tylenol) or ibuprofen (Advil). 3. Use ice packs to help control pain. 4. If you need a refill on your pain medication, please contact your pharmacy.  They will contact our office to request authorization. Prescriptions will not be filled after 5pm or on week-ends.  HOME MEDICATIONS 5. Take your usually prescribed medications unless otherwise directed.  DIET 6. You should follow a light diet the first few days after arrival home.  Be sure to include lots of fluids daily. Avoid fatty, fried foods.   CONSTIPATION 7. It is common to experience some constipation after surgery and if you are taking pain medication.  Increasing fluid intake and taking a stool softener (such as Colace) will usually help or prevent this problem from occurring.  A mild laxative (Milk of Magnesia or Miralax) should be taken according to package instructions if there are no bowel  movements after 48 hours.  WOUND/INCISION CARE 8. Most patients will experience some swelling and bruising in the area of the incisions.  Ice packs will help.  Swelling and bruising can take several days to resolve.  9. Unless discharge instructions indicate otherwise, follow guidelines below  a. STERI-STRIPS - you may remove your outer bandages 48 hours after surgery, and you may shower at that time.  You have steri-strips (small skin tapes) in place directly over the incision.  These strips should be left on the skin for 7-10 days.   b. DERMABOND/SKIN GLUE - you may shower in 24 hours.  The glue will flake off over the next 2-3 weeks. 10. Any sutures or staples will be removed at the office during your follow-up visit.  ACTIVITIES 11. You may resume regular (light) daily activities beginning the next day--such as daily self-care, walking, climbing stairs--gradually increasing activities as tolerated.  You may have sexual intercourse when it is comfortable.  Refrain from any heavy lifting or straining until approved by your doctor. a. You may drive when you are no longer taking prescription pain medication, you can comfortably wear a seatbelt, and you can safely maneuver your car and apply brakes.  FOLLOW-UP 12. You should see your doctor in the office for a follow-up appointment approximately 2-3 weeks after your surgery.  You should have been given your post-op/follow-up appointment when your surgery was scheduled.  If you did not receive a post-op/follow-up appointment, make sure that you call for this appointment within a day or two after you arrive home to insure a convenient appointment time.  WHEN   TO CALL YOUR DOCTOR: 1. Fever over 101.0 2. Inability to urinate 3. Continued bleeding from incision. 4. Increased pain, redness, or drainage from the incision. 5. Increasing abdominal pain  The clinic staff is available to answer your questions during regular business hours.  Please don't  hesitate to call and ask to speak to one of the nurses for clinical concerns.  If you have a medical emergency, go to the nearest emergency room or call 911.  A surgeon from [facility] is always on call at the hospital. [address], [address], [place], [place]  [address] ? [address] [address], [place], [place]   [address] [phone] ? [phone] ? FAX [phone] Web site: [email or web address]  

## 2020-12-18 NOTE — Discharge Summary (Signed)
Physician Discharge Summary  Wendy [name] YQM:[id number] DOB: November 24, 1941 DOA: 12/09/2020  PCP: Wendy Jeans, [name]  Admit date: 12/09/2020 Discharge date: 12/18/2020  Admitted From: Home Disposition: Home with home hospice  Recommendations for Outpatient Follow-up:  1. Follow ups as below. 2. Please obtain CBC/BMP/Mag at follow up 3. Please follow up on the following pending results: None  Equipment/Devices: Hospice to deliver hospital bed and bedside commode  Discharge Condition: Stable CODE STATUS: DNR/DNI   Follow-up Information    Wendy, [name], MD. Schedule an appointment as soon as possible for a visit in 4 week(s).   Specialty: General Surgery Why: for follow up from recent surgery  Contact information: [name] [phone]        Wendy [name], [place]. Schedule an appointment as soon as possible for a visit in 1 week(s).   Specialty: Family Medicine Contact information: [address] [place] [address] [phone]               Hospital Course: 80 y.o.femalewith past medical history significant for CAD, chronic kidney disease stage IIIb, and a history of left breast metastatic cancer to lung liver and bone comes into the emergency room with abdominal pain nausea and vomiting with constipation. CT scan of the abdomen and pelvis was concerning for high-grade bowel obstruction with twisting of the colon in the left upper quadrant, general surgery was consulted, related to concern for closed-loop obstruction due to metastatic disease.  Gastroenterology consulted but did not have much to add.  Her oncology was consulted to comment on her prognosis which was felt to be 1 to 2 years on current medication.  Palliative medicine consulted and recommended home hospice after meeting with patient and family. Patient underwent laparoscopic lysis of omental adhesion on 12/14/2019.  Postop, she had mild  hypotension.  Echocardiogram without significant finding.  A.m. cortisol within normal.  Thyroid function test revealed primary hyperthyroidism.  She was a started on Synthroid with improvement in her blood pressure.   On the day of discharge, hemodynamically stable.  Feels much better.  DME delivered by home hospice.  See individual problem list below for more hospital course.  Discharge Diagnoses:  Large Bowel obstruction ([place]) due to omental adhesion-resolved. -S/p laparoscopic lysis of omental adhesions by general surgery. -Tolerating soft diet and having bowel movements. -Tylenol and as needed oxycodone for pain control. -As needed MiraLAX and Senokot-S for bowel regimen -Outpatient follow-up with general surgery  Metastatic breast cancer with mets to liver, lung and bone-received chemotherapy and adjuvant radiation therapy. Follow-up with Dr. [name] outpatient. Lesions are larger on CT.  Median survival felt to be 1 to 2 years on PIQRAY. -Outpatient follow-up with oncology -Home hospice per palliative medicine  AKI on CKD-3B/azotemia with oliguria inpatient weights left nephrectomy: Resolved. Renal US without significant finding. -Recheck renal function in 1 week  UTI: Reports some dysuria.  Urine culture with pansensitive E. Coli and Proteus mirabilis. -De-escalated antibiotic to Ancef and discharged on Keflex for 3 more days  Prolonged QTc in the setting of hypokalemia: Hypokalemia resolved. -Avoid or minimize QT prolonging drugs  Hypokalemia/hypophosphatemia/hypocalcemia: Hypokalemia and hypomagnesemia resolved. Ca 7.2 , and corrects to 8.6. P  1.7 and replenished prior to discharge. -Recheck labs in 1 to 2 weeks -Encourage p.o. intake  Metabolic acidosis: Improved.  History of CAD: No chest pain. -Continue home meds  Hypoglycemia: Likely due to n.p.o.  Not diabetic.  Not on  hypoglycemic agent.  Resolved  Hypotension-resolved.  Could be due to hypothyroidism.   TTE without significant finding.  A.m. cortisol within normal. -Discontinue home HCTZ -Started Synthroid for hypothyroidism  Primary hypothyroidism: TSH in 100s.  Free T4 barely detectable. -Started Synthroid 50 mcg daily -Check TSH in 3 to 4 weeks and adjust Synthroid.  Goal of care/DNR/DNI -Hospice follow-up at home per palliative medicine   Body mass index is 23.6 kg/m.            Discharge Exam: Vitals:   12/18/20 0542 12/18/20 0927  BP: 110/61 130/66  Pulse: (!) 59 66  Resp: 17 17  Temp: (!) 97.4 F (36.3 C) (!) 97.3 F (36.3 C)  SpO2: 96% 98%    GENERAL: No apparent distress.  Nontoxic. HEENT: MMM.  Vision and hearing grossly intact.  NECK: Supple.  No apparent JVD.  RESP: On RA.  No IWOB.  Fair aeration bilaterally. CVS:  RRR. Heart sounds normal.  ABD/GI/GU: Bowel sounds present. Soft.  Mild discomfort over RLQ. MSK/EXT:  Moves extremities. No apparent deformity.  Some LUE lymphedema. SKIN: no apparent skin lesion or wound NEURO: Awake, alert and oriented appropriately.  No apparent focal neuro deficit. PSYCH: Calm. Normal affect.  Discharge Instructions  Discharge Instructions    Call MD for:  difficulty breathing, headache or visual disturbances   Complete by: As directed    Call MD for:  extreme fatigue   Complete by: As directed    Call MD for:  severe uncontrolled pain   Complete by: As directed    Diet - low sodium heart healthy   Complete by: As directed    Discharge instructions   Complete by: As directed    It has been a pleasure taking care of you!  You were hospitalized due to bowel obstruction that was treated surgically.  You were also diagnosed with hypothyroidism (low thyroid level).  You have been started on Synthroid (thyroid medication).  Please follow-up with your surgeon, primary care doctor and oncologist in the next 1 to 2 weeks or sooner if needed.   Take care,   Increase activity slowly   Complete by: As directed    No  wound care   Complete by: As directed      Allergies as of 12/18/2020   No Known Allergies     Medication List    STOP taking these medications   atorvastatin 20 MG tablet Commonly known as: LIPITOR   Claritin-D 24 Hour 10-240 MG 24 hr tablet Generic drug: loratadine-pseudoephedrine   hydrochlorothiazide 12.5 MG capsule Commonly known as: MICROZIDE   potassium chloride SA 20 MEQ tablet Commonly known as: KLOR-CON     TAKE these medications   acetaminophen 500 MG tablet Commonly known as: TYLENOL Take 1,000 mg by mouth every 6 (six) hours as needed for mild pain.   alpelisib 200 MG Therapy Pack Commonly known as: PIQRAY 200mg  Daily Dose Take one 200mg  tablet with food at the same time daily. Swallow whole, do not crush, chew, or split. What changed:   how much to take  how to take this  when to take this   CALCIUM 600 + D PO Take 1 tablet by mouth daily.   cephALEXin 500 MG capsule Commonly known as: KEFLEX Take 1 capsule (500 mg total) by mouth 3 (three) times daily for 3 days.   levothyroxine 50 MCG tablet Commonly known as: SYNTHROID Take 1 tablet (50 mcg total) by mouth daily at 6 (six) AM.  Start taking on: December 19, 2020   multivitamin with minerals Tabs tablet Take 1 tablet by mouth daily.   nitroGLYCERIN 0.4 MG SL tablet Commonly known as: NITROSTAT PLACE 1 TABLET (0.4 MG TOTAL) UNDER THE TONGUE EVERY 5 (FIVE) MINUTES AS NEEDED FOR CHEST PAIN.   oxyCODONE 5 MG immediate release tablet Commonly known as: Oxy IR/ROXICODONE Take 1 tablet (5 mg total) by mouth every 8 (eight) hours as needed for up to 5 days for moderate pain, severe pain or breakthrough pain.   polyethylene glycol powder 17 GM/SCOOP powder Commonly known as: MiraLax Take 17 g by mouth 2 (two) times daily as needed for moderate constipation.   senna-docusate 8.6-50 MG tablet Commonly known as: Senokot-S Take 1 tablet by mouth 2 (two) times daily between meals as needed for mild  constipation.   Slow Iron 160 (50 Fe) MG Tbcr SR tablet Generic drug: ferrous sulfate Take 1 tablet (160 mg total) by mouth daily.       Consultations:  General surgery  Oncology  Palliative medicine  Gastroenterology  Procedures/Studies:  2D Echo on 12/17/2020 1. Left ventricular ejection fraction, by estimation, is 60 to 65%. The  left ventricle has normal function. The left ventricle has no regional  wall motion abnormalities. Left ventricular diastolic parameters are  indeterminate.  2. Right ventricular systolic function is normal. The right ventricular  size is normal. There is normal pulmonary artery systolic pressure. The  estimated right ventricular systolic pressure is 62.8 mmHg.  3. A small pericardial effusion is present. The pericardial effusion is  circumferential. There is no evidence of cardiac tamponade.  4. The mitral valve is grossly normal. Mild mitral valve regurgitation.  5. The aortic valve is abnormal. There is mild calcification of the  aortic valve. There is moderate thickening of the aortic valve. Aortic  valve regurgitation is mild.  6. The inferior vena cava is normal in size with <50% respiratory  variability, suggesting right atrial pressure of 8 mmHg.   DG Chest 2 View  Result Date: 12/16/2020 CLINICAL DATA:  [age]-year-old female with shortness of breath and abdominal pain. EXAM: CHEST - 2 VIEW COMPARISON:  Earlier radiograph dated 12/16/2020. CT dated 12/10/2020. FINDINGS: There is diffuse pleuroparenchymal opacification of the left lung as seen on the earlier radiograph and corresponding to combination of left pleural effusion and left apical consolidation seen on the prior CT. A small right pleural effusion is suspected. No pneumothorax. Stable cardiac silhouette. Atherosclerotic calcification of the aorta. No acute osseous pathology. IMPRESSION: 1. Diffuse pleuroparenchymal opacification of the left lung similar to the earlier  radiograph. 2. Probable small right pleural effusion. Electronically Signed   By: Anner Crete M.D.   On: 12/16/2020 18:54   DG Abd 1 View  Result Date: 12/14/2020 CLINICAL DATA:  Abdominal pain and distension postoperative EXAM: ABDOMEN - 1 VIEW COMPARISON:  December 12, 2020 FINDINGS: There is dilatation of the cecum measuring 12.7 cm. The cecum extends into the upper mid abdomen. There is no other appreciable colonic dilatation. No small bowel dilatation. No air-fluid levels. No free air. Postoperative changes noted in the left abdomen. IMPRESSION: Cecal dilatation consistent with cecal ileus. The location of the cecum may indicate potential developing cecal volvulus. This circumstance is felt to warrant colonoscopic assessment of the cecal region. No other bowel dilatation. No free air evident. These results will be called to the ordering clinician or representative by the Radiologist Assistant, and communication documented in the PACS or [employer]. Electronically  Signed   By: Lowella Grip III M.D.   On: 12/14/2020 11:04   DG Abd 1 View  Result Date: 12/12/2020 CLINICAL DATA:  Colon obstruction. EXAM: ABDOMEN - 1 VIEW COMPARISON:  12/10/2020 and prior. FINDINGS: Gas is seen within dilated proximal and mid large bowel loops. Gas-filled nondilated small bowel loops. No evidence of pneumoperitoneum. No suspicious calcific densities. Mid abdominal and pelvic surgical clips. No acute osseous abnormality. IMPRESSION: Findings consistent with large bowel obstruction. Electronically Signed   By: Primitivo Gauze M.D.   On: 12/12/2020 13:34   US RENAL  Result Date: 12/16/2020 CLINICAL DATA:  Oliguria.  History of left nephrectomy. EXAM: RENAL / URINARY TRACT ULTRASOUND COMPLETE COMPARISON:  CT abdomen July 28, 2020 FINDINGS: Right Kidney: Renal measurements: 11.7 x 4.4 x 4.7 cm = volume: 127 mL. Normal renal cortical thickness and echogenicity. No hydronephrosis. Possible 4 mm  parenchymal calcification versus stone. Left Kidney: Surgically absent. Bladder: Decompressed with Foley catheter. Other: Heterogeneous appearance of the liver compatible with metastatic disease. Right pleural effusion. IMPRESSION: No right-sided hydronephrosis. Electronically Signed   By: Lovey Newcomer M.D.   On: 12/16/2020 13:19   CT CHEST ABDOMEN PELVIS W CONTRAST  Result Date: 12/10/2020 CLINICAL DATA:  Bowel obstruction suspected. Left lower quadrant abdominal pain. History of breast cancer. EXAM: CT CHEST, ABDOMEN, AND PELVIS WITH CONTRAST TECHNIQUE: Multidetector CT imaging of the chest, abdomen and pelvis was performed following the standard protocol during bolus administration of intravenous contrast. CONTRAST:  79mL OMNIPAQUE IOHEXOL 300 MG/ML  SOLN COMPARISON:  October 24, 2020. FINDINGS: CT CHEST FINDINGS Cardiovascular: The heart size is stable. There is a trace pericardial effusion. There are atherosclerotic changes of the coronary arteries. There are atherosclerotic changes of the thoracic aorta without evidence for a dissection or aneurysm. Mediastinum/Nodes: -- No mediastinal lymphadenopathy. -- No hilar lymphadenopathy. -- No axillary lymphadenopathy. -- No supraclavicular lymphadenopathy. --the thyroid gland is not well visualized. -  Unremarkable esophagus. Lungs/Pleura: Chronic changes are again noted of the left lung field, especially within the left upper lobe. There is a chronic left-sided pleural effusion. The trachea is unremarkable. The mediastinum is shifted to the left secondary to left-sided volume loss. Musculoskeletal: There is no definite acute compression fracture. Lytic lesions are again noted throughout the thoracic spine, most notably involving the T11 and T5 vertebral bodies. CT ABDOMEN PELVIS FINDINGS Hepatobiliary: Again noted is a dominant liver lesion in the right hepatic lobe measuring approximately 6.9 x 6.7 cm (previously measuring approximately 5.8 x 6.6 cm. Normal  gallbladder.There is no biliary ductal dilation. Pancreas: Normal contours without ductal dilatation. No peripancreatic fluid collection. Spleen: Unremarkable. Adrenals/Urinary Tract: --Adrenal glands: Unremarkable. --Right kidney/ureter: No hydronephrosis or radiopaque kidney stones. --Left kidney/ureter: Left kidney is surgically absent. --Urinary bladder: There is a focus of gas within the urinary bladder. Stomach/Bowel: --Stomach/Duodenum: There is mild gastric wall thickening. --Small bowel: The majority of the small bowel appears to now reside in the left abdomen. --Colon: There is a large amount of stool throughout the rectum. There is a wondering cecum. There is distension of the ascending colon, cecum, and portions of the transverse colon. There is an abrupt transition point in the left upper quadrant where there is twisting of the splenic flexure of the colon. There is haziness of the mesentery in the left upper quadrant. --Appendix: Normal. Vascular/Lymphatic: Atherosclerotic calcification is present within the non-aneurysmal abdominal aorta, without hemodynamically significant stenosis. --No retroperitoneal lymphadenopathy. --No mesenteric lymphadenopathy. --No pelvic or inguinal lymphadenopathy. Reproductive: Status post  hysterectomy. No adnexal mass. Other: No ascites or free air. The abdominal wall is normal. Musculoskeletal. There is an unchanged compression fracture of the L1 vertebral body. IMPRESSION: 1. Findings are consistent with a moderate to high-grade large bowel obstruction. There is twisting of the colon in the left upper quadrant resulting in dilatation of the transverse colon and ascending colon. Findings are concerning for an internal hernia. The appearance of the colon suggest this is a closed loop obstruction. Surgical consultation is recommended. There is no free air or pneumatosis. 2. Mild gastric wall thickening, which may be secondary to underdistention or gastritis. 3. Again  noted are findings consistent with metastatic breast cancer. The dominant right hepatic lobe lesion has increased in size since the prior study. 4. Focus of gas within the urinary bladder. Correlation with urinalysis is recommended to help exclude cystitis. 5. Chronic changes of the left lung field as described above. Aortic Atherosclerosis (ICD10-I70.0). Electronically Signed   By: Constance Holster M.D.   On: 12/10/2020 00:56   DG Chest Port 1 View  Result Date: 12/16/2020 CLINICAL DATA:  Shortness of breath. History of breast cancer with metastasis. EXAM: PORTABLE CHEST 1 VIEW COMPARISON:  12/10/2020 CT. Most recent plain film of the chest of 08/16/2019. FINDINGS: Mild tracheal deviation to the left, consistent with volume loss in the left hemithorax. Normal heart size. Possible layering small right pleural effusion. Pleuroparenchymal opacification throughout the left hemithorax is slightly increased compared to 08/16/2019. Aeration in the left lung base is felt to be worsened compared to the CT of 12/10/2020, given cross modality comparison. Skin fold artifact over the lower left chest. No pneumothorax. Left upper quadrant gas is likely within the stomach. Left axillary node dissection. Left mastectomy. IMPRESSION: Chronic left hemithorax pleuroparenchymal opacification. Given cross modality comparison, aeration at the left lung base may be worsened compared to 12/10/2020. Cannot exclude superimposed atelectasis or infection. Possible layering right pleural effusion, new since the prior CT. Consider PA and lateral radiographs if possible. Electronically Signed   By: Abigail Miyamoto M.D.   On: 12/16/2020 17:01   DG Abd Portable 1V  Result Date: 12/16/2020 CLINICAL DATA:  80 year old female with right-sided abdominal pain. EXAM: PORTABLE ABDOMEN - 1 VIEW COMPARISON:  CT abdomen pelvis dated 12/10/2020. FINDINGS: There is gaseous distension of the stomach. There is air distension of the transverse colon and  proximal descending colon as seen on the prior CT. Air is noted within several nondistended loops of small bowel. No dilated small bowel. No free air. Multiple surgical clips noted over the abdomen. There is degenerative changes of the spine. No acute osseous pathology. IMPRESSION: 1. Gaseous distension of the stomach. 2. Air within the transverse and proximal descending colon as seen on the prior CT. 3. No dilated small bowel loops. Electronically Signed   By: Anner Crete M.D.   On: 12/16/2020 17:28   ECHOCARDIOGRAM COMPLETE  Result Date: 12/17/2020    ECHOCARDIOGRAM REPORT   Patient Name:   Wendy [name] Date of Exam: 12/17/2020 Medical Rec #:  [id number]        Height:       68.0 in Accession #:    [id number]       Weight:       156.1 lb Date of Birth:  [birth year]        BSA:          1.839 m Patient Age:    80 years         BP:  118/55 mmHg Patient Gender: F                HR:           70 bpm. Exam Location:  Inpatient Procedure: 2D Echo Indications:    Dyspnea  History:        Patient has prior history of Echocardiogram examinations, most                 recent 08/16/2019. Previous Myocardial Infarction and CAD; Risk                 Factors:Hypertension and Dyslipidemia.  Sonographer:    Mikki Santee RDCS (AE) Referring Phys: [id number] Charlesetta Ivory Simon Llamas IMPRESSIONS  1. Left ventricular ejection fraction, by estimation, is 60 to 65%. The left ventricle has normal function. The left ventricle has no regional wall motion abnormalities. Left ventricular diastolic parameters are indeterminate.  2. Right ventricular systolic function is normal. The right ventricular size is normal. There is normal pulmonary artery systolic pressure. The estimated right ventricular systolic pressure is 08.1 mmHg.  3. A small pericardial effusion is present. The pericardial effusion is circumferential. There is no evidence of cardiac tamponade.  4. The mitral valve is grossly normal. Mild mitral valve regurgitation.   5. The aortic valve is abnormal. There is mild calcification of the aortic valve. There is moderate thickening of the aortic valve. Aortic valve regurgitation is mild.  6. The inferior vena cava is normal in size with <50% respiratory variability, suggesting right atrial pressure of 8 mmHg. Comparison(s): A prior study was performed on 08/16/2019. Prior images reviewed side by side. Similar effusion, with slight thickening of the aortic valve with mild aortic regurgitation now present. FINDINGS  Left Ventricle: Left ventricular ejection fraction, by estimation, is 60 to 65%. The left ventricle has normal function. The left ventricle has no regional wall motion abnormalities. The left ventricular internal cavity size was normal in size. There is  no left ventricular hypertrophy. Left ventricular diastolic parameters are indeterminate. Right Ventricle: The right ventricular size is normal. No increase in right ventricular wall thickness. Right ventricular systolic function is normal. There is normal pulmonary artery systolic pressure. The tricuspid regurgitant velocity is 2.32 m/s, and  with an assumed right atrial pressure of 8 mmHg, the estimated right ventricular systolic pressure is 44.8 mmHg. Left Atrium: Left atrial size was normal in size. Right Atrium: Right atrial size was normal in size. Pericardium: A small pericardial effusion is present. The pericardial effusion is circumferential. There is no evidence of cardiac tamponade. Mitral Valve: The mitral valve is grossly normal. Mild mitral valve regurgitation. Tricuspid Valve: The tricuspid valve is normal in structure. Tricuspid valve regurgitation is mild. Aortic Valve: The aortic valve is abnormal. There is mild calcification of the aortic valve. There is moderate thickening of the aortic valve. Aortic valve regurgitation is mild. Pulmonic Valve: The pulmonic valve was grossly normal. Pulmonic valve regurgitation is not visualized. Aorta: The aortic root and  ascending aorta are structurally normal, with no evidence of dilitation. Venous: The inferior vena cava is normal in size with less than 50% respiratory variability, suggesting right atrial pressure of 8 mmHg. IAS/Shunts: The atrial septum is grossly normal.  LEFT VENTRICLE PLAX 2D LVIDd:         3.00 cm  Diastology LVIDs:         1.90 cm  LV e' medial:    3.90 cm/s LV PW:  0.80 cm  LV E/e' medial:  20.4 LV IVS:        1.00 cm  LV e' lateral:   3.98 cm/s LVOT diam:     1.90 cm  LV E/e' lateral: 20.0 LV SV:         63 LV SV Index:   34 LVOT Area:     2.84 cm  RIGHT VENTRICLE RV S prime:     7.35 cm/s TAPSE (M-mode): 1.4 cm LEFT ATRIUM             Index       RIGHT ATRIUM           Index LA diam:        3.50 cm 1.90 cm/m  RA Area:     14.20 cm LA Vol (A2C):   42.5 ml 23.11 ml/m RA Volume:   32.30 ml  17.56 ml/m LA Vol (A4C):   51.5 ml 28.00 ml/m LA Biplane Vol: 50.6 ml 27.51 ml/m  AORTIC VALVE LVOT Vmax:   97.30 cm/s LVOT Vmean:  62.600 cm/s LVOT VTI:    0.221 m  AORTA Ao Root diam: 2.80 cm MITRAL VALVE               TRICUSPID VALVE MV Area (PHT): 2.42 cm    TR Peak grad:   21.5 mmHg MV Decel Time: 313 msec    TR Vmax:        232.00 cm/s MV E velocity: 79.70 cm/s MV A velocity: 93.00 cm/s  SHUNTS MV E/A ratio:  0.86        Systemic VTI:  0.22 m                            Systemic Diam: 1.90 cm Rudean Haskell MD Electronically signed by Rudean Haskell MD Signature Date/Time: 12/17/2020/1:25:38 PM    Final         The results of significant diagnostics from this hospitalization (including imaging, microbiology, ancillary and laboratory) are listed below for reference.     Microbiology: Recent Results (from the past 240 hour(s))  SARS CORONAVIRUS 2 (TAT 6-24 HRS) Nasopharyngeal Nasopharyngeal Swab     Status: None   Collection Time: 12/10/20  3:26 AM   Specimen: Nasopharyngeal Swab  Result Value Ref Range Status   SARS Coronavirus 2 NEGATIVE NEGATIVE Final    Comment:  (NOTE) SARS-CoV-2 target nucleic acids are NOT DETECTED.  The SARS-CoV-2 RNA is generally detectable in upper and lower respiratory specimens during the acute phase of infection. Negative results do not preclude SARS-CoV-2 infection, do not rule out co-infections with other pathogens, and should not be used as the sole basis for treatment or other patient management decisions. Negative results must be combined with clinical observations, patient history, and epidemiological information. The expected result is Negative.  Fact Sheet for Patients: SugarRoll.be  Fact Sheet for Healthcare Providers: https://www.woods-mathews.com/  This test is not yet approved or cleared by the [place] FDA and  has been authorized for detection and/or diagnosis of SARS-CoV-2 by FDA under an Emergency Use Authorization (EUA). This EUA will remain  in effect (meaning this test can be used) for the duration of the COVID-19 declaration under Se ction 564(b)(1) of the Act, 21 U.S.C. section 360bbb-3(b)(1), unless the authorization is terminated or revoked sooner.  Performed at [facility], [place] [address], [place], [place] [address]   Culture, Urine     Status: Abnormal  Collection Time: 12/10/20  3:26 AM   Specimen: Urine, Random  Result Value Ref Range Status   Specimen Description   Final    URINE, RANDOM Performed at [facility] [address], [place], [place] [address]    Special Requests   Final    NONE Performed at [facility], [place] [address], [place], [place] [address]    Culture (A)  Final    >=100,000 COLONIES/mL ESCHERICHIA COLI 10,000 COLONIES/mL PROTEUS MIRABILIS    Report Status 12/13/2020 FINAL  Final   Organism ID, Bacteria ESCHERICHIA COLI (A)  Final   Organism ID, Bacteria PROTEUS MIRABILIS (A)  Final      Susceptibility   Escherichia coli - MIC*    AMPICILLIN <=2 SENSITIVE  Sensitive     CEFAZOLIN <=4 SENSITIVE Sensitive     CEFEPIME <=0.12 SENSITIVE Sensitive     CEFTRIAXONE <=0.25 SENSITIVE Sensitive     CIPROFLOXACIN <=0.25 SENSITIVE Sensitive     GENTAMICIN <=1 SENSITIVE Sensitive     IMIPENEM <=0.25 SENSITIVE Sensitive     NITROFURANTOIN <=16 SENSITIVE Sensitive     TRIMETH/SULFA <=20 SENSITIVE Sensitive     AMPICILLIN/SULBACTAM <=2 SENSITIVE Sensitive     PIP/TAZO <=4 SENSITIVE Sensitive     * >=100,000 COLONIES/mL ESCHERICHIA COLI   Proteus mirabilis - MIC*    AMPICILLIN <=2 SENSITIVE Sensitive     CEFAZOLIN <=4 SENSITIVE Sensitive     CEFEPIME <=0.12 SENSITIVE Sensitive     CEFTRIAXONE <=0.25 SENSITIVE Sensitive     CIPROFLOXACIN <=0.25 SENSITIVE Sensitive     GENTAMICIN <=1 SENSITIVE Sensitive     IMIPENEM 2 SENSITIVE Sensitive     NITROFURANTOIN 128 RESISTANT Resistant     TRIMETH/SULFA <=20 SENSITIVE Sensitive     AMPICILLIN/SULBACTAM <=2 SENSITIVE Sensitive     PIP/TAZO <=4 SENSITIVE Sensitive     * 10,000 COLONIES/mL PROTEUS MIRABILIS  Surgical pcr screen     Status: None   Collection Time: 12/13/20  7:44 AM   Specimen: Nasal Mucosa; Nasal Swab  Result Value Ref Range Status   MRSA, PCR NEGATIVE NEGATIVE Final   Staphylococcus aureus NEGATIVE NEGATIVE Final    Comment: (NOTE) The Xpert SA Assay (FDA approved for NASAL specimens in patients 58 years of age and older), is one component of a comprehensive surveillance program. It is not intended to diagnose infection nor to guide or monitor treatment. Performed at [facility], [place] [address], [place], [place] [address]      Labs:  CBC: Recent Labs  Lab 12/12/20 0530 12/14/20 0604 12/16/20 0608 12/18/20 0518  WBC 6.4 4.7 7.7 7.4  HGB 13.5 12.5 11.7* 10.5*  HCT 41.6 39.0 35.2* 32.3*  MCV 98.6 97.5 95.9 97.6  PLT 228 233 186 175   BMP &GFR Recent Labs  Lab 12/12/20 0530 12/14/20 0604 12/16/20 0608 12/17/20 0536 12/18/20 0518  NA 140 143 141 139  137  K 3.9 3.8 3.2* 4.0 3.9  CL 106 108 111 112* 112*  CO2 19* 24 22 21* 20*  GLUCOSE 59* 127* 83 73 77  BUN 30* 21 13 13 11   CREATININE 1.17* 1.16* 0.91 0.85 0.75  CALCIUM 8.8* 7.6* 6.8* 7.2* 7.2*  MG  --  2.2 1.8 1.6* 1.9  PHOS  --  2.0* <1.0* 1.5* 1.7*   Estimated Creatinine Clearance: 57.5 mL/min (by C-G formula based on SCr of 0.75 mg/dL). Liver & Pancreas: Recent Labs  Lab 12/12/20 0530 12/14/20 0604 12/16/20 9470 12/17/20 0536  12/18/20 0518  AST 45*  --   --   --   --   ALT 24  --   --   --   --   ALKPHOS 107  --   --   --   --   BILITOT 1.3*  --   --   --   --   PROT 6.2*  --   --   --   --   ALBUMIN 3.5 3.1* 2.5* 2.3* 2.2*   No results for input(s): LIPASE, AMYLASE in the last 168 hours. No results for input(s): AMMONIA in the last 168 hours. Diabetic: No results for input(s): HGBA1C in the last 72 hours. Recent Labs  Lab 12/13/20 1511 12/13/20 1549 12/14/20 0642 12/14/20 1604 12/15/20 0729  GLUCAP 66* 110* 125* 207* 110*   Cardiac Enzymes: No results for input(s): CKTOTAL, CKMB, CKMBINDEX, TROPONINI in the last 168 hours. No results for input(s): PROBNP in the last 8760 hours. Coagulation Profile: No results for input(s): INR, PROTIME in the last 168 hours. Thyroid Function Tests: Recent Labs    12/16/20 1255  TSH 102.406*  FREET4 <0.25*   Lipid Profile: No results for input(s): CHOL, HDL, LDLCALC, TRIG, CHOLHDL, LDLDIRECT in the last 72 hours. Anemia Panel: No results for input(s): VITAMINB12, FOLATE, FERRITIN, TIBC, IRON, RETICCTPCT in the last 72 hours. Urine analysis:    Component Value Date/Time   COLORURINE YELLOW 08/16/2019 0040   APPEARANCEUR CLEAR 08/16/2019 0040   LABSPEC 1.015 08/16/2019 0040   PHURINE 5.0 08/16/2019 0040   GLUCOSEU NEGATIVE 08/16/2019 0040   HGBUR NEGATIVE 08/16/2019 0040   BILIRUBINUR NEGATIVE 08/16/2019 0040   KETONESUR NEGATIVE 08/16/2019 0040   PROTEINUR NEGATIVE 08/16/2019 0040   NITRITE NEGATIVE  08/16/2019 0040   LEUKOCYTESUR SMALL (A) 08/16/2019 0040   Sepsis Labs: Invalid input(s): PROCALCITONIN, LACTICIDVEN   Time coordinating discharge: 45 minutes  SIGNED:  Mercy Riding, MD  [facility] 12/18/2020, 11:41 AM  If 7PM-7AM, please contact night-coverage [email or web address]

## 2020-12-18 NOTE — TOC Transition Note (Signed)
Transition of Care [facility]) - CM/SW Discharge Note   Patient Details  Name: Wendy [name] MRN: [id number] Date of Birth: December 08, 1940  Transition of [facility]) CM/SW Contact:  Lennart Pall, LCSW Phone Number: 12/18/2020, 12:07 PM   Clinical Narrative:    Alerted by MD that pt is medically ready for dc today.  Have confirmed with pt and son, Timmothy Sours, that pt will dc to son's home with [facility] to provide support and that all DME has been delivered.  Plan for pt to transport home via [facility] (son requesting pick up at 5pm).  Have alerted Cassandra with [facility]osp[facility] of pt's dc today.  No further TOC needs.   Final next level of care: Home w Hospice Care Barriers to Discharge: Barriers Resolved   Patient Goals and CMS Choice Patient states their goals for this hospitalization and ongoing recovery are:: home w/hospice services CMS Medicare.gov Compare Post Acute Care list provided to:: Patient Choice offered to / list presented to : [place]  Discharge Placement                Patient to be transferred to facility by: transfer home via [facility] Name of family member notified: son, Timmothy Sours Patient and family notified of of transfer: 12/18/20  Discharge Plan and Services   Discharge Planning Services: CM Consult Post Acute Care Choice: Hospice          DME Arranged: 3-N-1,Hospital bed,Overbed table DME Agency:  (arranged via [facility])         [place] Date [place]: 12/15/20 Time [place]: 1216 Representative spoke with at [place]: [place] ([place]) Interventions     Readmission Risk Interventions No flowsheet data found.

## 2020-12-18 NOTE — Care Management Important Message (Signed)
Medicare important message printed for Wendy [name] to give to the patient.

## 2020-12-18 NOTE — Progress Notes (Signed)
5 Days Post-Op   Subjective/Chief Complaints Denies pain this AM. Tolerating PO but does not like the food. States she is voiding ok and had a large BM yesterday. Requesting to get OOB today, states she has not been up since surgery.  Objective: Vital signs in last 24 hours: Temp:  [97.3 F (36.3 C)-98.1 F (36.7 C)] 97.4 F (36.3 C) (01/24 0542) Pulse Rate:  [59-74] 59 (01/24 0542) Resp:  [16-18] 17 (01/24 0542) BP: (110-121)/(51-67) 110/61 (01/24 0542) SpO2:  [95 %-98 %] 96 % (01/24 0542) Weight:  [70.4 kg] 70.4 kg (01/24 0542) Last BM Date: 12/17/20  Intake/Output from previous day: 01/23 0701 - 01/24 0700 In: 2553.7 [P.O.:1020; I.V.:833.7; IV Piggyback:700] Out: 700 [Urine:700] Intake/Output this shift: Total I/O In: -  Out: 130 [Urine:130]  GI: soft nontender bs present incisions clean  Lab Results:  Recent Labs    12/16/20 0608 12/18/20 0518  WBC 7.7 7.4  HGB 11.7* 10.5*  HCT 35.2* 32.3*  PLT 186 175   BMET Recent Labs    12/17/20 0536 12/18/20 0518  NA 139 137  K 4.0 3.9  CL 112* 112*  CO2 21* 20*  GLUCOSE 73 77  BUN 13 11  CREATININE 0.85 0.75  CALCIUM 7.2* 7.2*   PT/INR No results for input(s): LABPROT, INR in the last 72 hours. ABG No results for input(s): PHART, HCO3 in the last 72 hours.  Invalid input(s): PCO2, PO2  Studies/Results: DG Chest 2 View  Result Date: 12/16/2020 CLINICAL DATA:  [age]-year-old female with shortness of breath and abdominal pain. EXAM: CHEST - 2 VIEW COMPARISON:  Earlier radiograph dated 12/16/2020. CT dated 12/10/2020. FINDINGS: There is diffuse pleuroparenchymal opacification of the left lung as seen on the earlier radiograph and corresponding to combination of left pleural effusion and left apical consolidation seen on the prior CT. A small right pleural effusion is suspected. No pneumothorax. Stable cardiac silhouette. Atherosclerotic calcification of the aorta. No acute osseous pathology. IMPRESSION: 1.  Diffuse pleuroparenchymal opacification of the left lung similar to the earlier radiograph. 2. Probable small right pleural effusion. Electronically Signed   By: Anner Crete M.D.   On: 12/16/2020 18:54   US RENAL  Result Date: 12/16/2020 CLINICAL DATA:  Oliguria.  History of left nephrectomy. EXAM: RENAL / URINARY TRACT ULTRASOUND COMPLETE COMPARISON:  CT abdomen July 28, 2020 FINDINGS: Right Kidney: Renal measurements: 11.7 x 4.4 x 4.7 cm = volume: 127 mL. Normal renal cortical thickness and echogenicity. No hydronephrosis. Possible 4 mm parenchymal calcification versus stone. Left Kidney: Surgically absent. Bladder: Decompressed with Foley catheter. Other: Heterogeneous appearance of the liver compatible with metastatic disease. Right pleural effusion. IMPRESSION: No right-sided hydronephrosis. Electronically Signed   By: Lovey Newcomer M.D.   On: 12/16/2020 13:19   DG Chest Port 1 View  Result Date: 12/16/2020 CLINICAL DATA:  Shortness of breath. History of breast cancer with metastasis. EXAM: PORTABLE CHEST 1 VIEW COMPARISON:  12/10/2020 CT. Most recent plain film of the chest of 08/16/2019. FINDINGS: Mild tracheal deviation to the left, consistent with volume loss in the left hemithorax. Normal heart size. Possible layering small right pleural effusion. Pleuroparenchymal opacification throughout the left hemithorax is slightly increased compared to 08/16/2019. Aeration in the left lung base is felt to be worsened compared to the CT of 12/10/2020, given cross modality comparison. Skin fold artifact over the lower left chest. No pneumothorax. Left upper quadrant gas is likely within the stomach. Left axillary node dissection. Left mastectomy. IMPRESSION: Chronic left  hemithorax pleuroparenchymal opacification. Given cross modality comparison, aeration at the left lung base may be worsened compared to 12/10/2020. Cannot exclude superimposed atelectasis or infection. Possible layering right pleural  effusion, new since the prior CT. Consider PA and lateral radiographs if possible. Electronically Signed   By: Abigail Miyamoto M.D.   On: 12/16/2020 17:01   DG Abd Portable 1V  Result Date: 12/16/2020 CLINICAL DATA:  80 year old female with right-sided abdominal pain. EXAM: PORTABLE ABDOMEN - 1 VIEW COMPARISON:  CT abdomen pelvis dated 12/10/2020. FINDINGS: There is gaseous distension of the stomach. There is air distension of the transverse colon and proximal descending colon as seen on the prior CT. Air is noted within several nondistended loops of small bowel. No dilated small bowel. No free air. Multiple surgical clips noted over the abdomen. There is degenerative changes of the spine. No acute osseous pathology. IMPRESSION: 1. Gaseous distension of the stomach. 2. Air within the transverse and proximal descending colon as seen on the prior CT. 3. No dilated small bowel loops. Electronically Signed   By: Anner Crete M.D.   On: 12/16/2020 17:28   ECHOCARDIOGRAM COMPLETE  Result Date: 12/17/2020    ECHOCARDIOGRAM REPORT   Patient Name:   Wendy [name] Date of Exam: 12/17/2020 Medical Rec #:  [id number]        Height:       68.0 in Accession #:    [id number]       Weight:       156.1 lb Date of Birth:  May 23, 1941        BSA:          1.839 m Patient Age:    38 years         BP:           118/55 mmHg Patient Gender: F                HR:           70 bpm. Exam Location:  Inpatient Procedure: 2D Echo Indications:    Dyspnea  History:        Patient has prior history of Echocardiogram examinations, most                 recent 08/16/2019. Previous Myocardial Infarction and CAD; Risk                 Factors:Hypertension and Dyslipidemia.  Sonographer:    Mikki Santee RDCS (AE) Referring Phys: [id number] Charlesetta Ivory GONFA IMPRESSIONS  1. Left ventricular ejection fraction, by estimation, is 60 to 65%. The left ventricle has normal function. The left ventricle has no regional wall motion abnormalities. Left  ventricular diastolic parameters are indeterminate.  2. Right ventricular systolic function is normal. The right ventricular size is normal. There is normal pulmonary artery systolic pressure. The estimated right ventricular systolic pressure is 02.6 mmHg.  3. A small pericardial effusion is present. The pericardial effusion is circumferential. There is no evidence of cardiac tamponade.  4. The mitral valve is grossly normal. Mild mitral valve regurgitation.  5. The aortic valve is abnormal. There is mild calcification of the aortic valve. There is moderate thickening of the aortic valve. Aortic valve regurgitation is mild.  6. The inferior vena cava is normal in size with <50% respiratory variability, suggesting right atrial pressure of 8 mmHg. Comparison(s): A prior study was performed on 08/16/2019. Prior images reviewed side by side. Similar effusion, with slight thickening of the aortic valve with  mild aortic regurgitation now present. FINDINGS  Left Ventricle: Left ventricular ejection fraction, by estimation, is 60 to 65%. The left ventricle has normal function. The left ventricle has no regional wall motion abnormalities. The left ventricular internal cavity size was normal in size. There is  no left ventricular hypertrophy. Left ventricular diastolic parameters are indeterminate. Right Ventricle: The right ventricular size is normal. No increase in right ventricular wall thickness. Right ventricular systolic function is normal. There is normal pulmonary artery systolic pressure. The tricuspid regurgitant velocity is 2.32 m/s, and  with an assumed right atrial pressure of 8 mmHg, the estimated right ventricular systolic pressure is 61.6 mmHg. Left Atrium: Left atrial size was normal in size. Right Atrium: Right atrial size was normal in size. Pericardium: A small pericardial effusion is present. The pericardial effusion is circumferential. There is no evidence of cardiac tamponade. Mitral Valve: The mitral  valve is grossly normal. Mild mitral valve regurgitation. Tricuspid Valve: The tricuspid valve is normal in structure. Tricuspid valve regurgitation is mild. Aortic Valve: The aortic valve is abnormal. There is mild calcification of the aortic valve. There is moderate thickening of the aortic valve. Aortic valve regurgitation is mild. Pulmonic Valve: The pulmonic valve was grossly normal. Pulmonic valve regurgitation is not visualized. Aorta: The aortic root and ascending aorta are structurally normal, with no evidence of dilitation. Venous: The inferior vena cava is normal in size with less than 50% respiratory variability, suggesting right atrial pressure of 8 mmHg. IAS/Shunts: The atrial septum is grossly normal.  LEFT VENTRICLE PLAX 2D LVIDd:         3.00 cm  Diastology LVIDs:         1.90 cm  LV e' medial:    3.90 cm/s LV PW:         0.80 cm  LV E/e' medial:  20.4 LV IVS:        1.00 cm  LV e' lateral:   3.98 cm/s LVOT diam:     1.90 cm  LV E/e' lateral: 20.0 LV SV:         63 LV SV Index:   34 LVOT Area:     2.84 cm  RIGHT VENTRICLE RV S prime:     7.35 cm/s TAPSE (M-mode): 1.4 cm LEFT ATRIUM             Index       RIGHT ATRIUM           Index LA diam:        3.50 cm 1.90 cm/m  RA Area:     14.20 cm LA Vol (A2C):   42.5 ml 23.11 ml/m RA Volume:   32.30 ml  17.56 ml/m LA Vol (A4C):   51.5 ml 28.00 ml/m LA Biplane Vol: 50.6 ml 27.51 ml/m  AORTIC VALVE LVOT Vmax:   97.30 cm/s LVOT Vmean:  62.600 cm/s LVOT VTI:    0.221 m  AORTA Ao Root diam: 2.80 cm MITRAL VALVE               TRICUSPID VALVE MV Area (PHT): 2.42 cm    TR Peak grad:   21.5 mmHg MV Decel Time: 313 msec    TR Vmax:        232.00 cm/s MV E velocity: 79.70 cm/s MV A velocity: 93.00 cm/s  SHUNTS MV E/A ratio:  0.86        Systemic VTI:  0.22 m  Systemic Diam: 1.90 cm Rudean Haskell MD Electronically signed by Rudean Haskell MD Signature Date/Time: 12/17/2020/1:25:38 PM    Final      Anti-infectives: Anti-infectives (From admission, onward)   Start     Dose/Rate Route Frequency Ordered Stop   12/17/20 0600  ceFAZolin (ANCEF) IVPB 1 g/50 mL premix        1 g 100 mL/hr over 30 Minutes Intravenous Every 8 hours 12/16/20 1235 12/20/20 0559   12/15/20 1200  cefTRIAXone (ROCEPHIN) 1 g in sodium chloride 0.9 % 100 mL IVPB  Status:  Discontinued        1 g 200 mL/hr over 30 Minutes Intravenous Every 24 hours 12/15/20 1026 12/16/20 1208   12/13/20 0930  cefoTEtan (CEFOTAN) 2 g in sodium chloride 0.9 % 100 mL IVPB        2 g 200 mL/hr over 30 Minutes Intravenous On call to O.R. 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 Brantley Stage From a surgical perspective the patient is stable for discharge - she is tolerating PO, pain controlled, having bowel function, incisions clean.   Jill Alexanders 12/18/2020

## 2020-12-18 NOTE — Progress Notes (Signed)
PIV removed from right forearm.  Catheter was intact and site was clean and dry.

## 2020-12-28 ENCOUNTER — Ambulatory Visit: Payer: Medicare Other

## 2021-01-05 ENCOUNTER — Telehealth: Payer: Self-pay | Admitting: *Deleted

## 2021-01-05 NOTE — Telephone Encounter (Signed)
Received vm call from Amanda/RN/Hospice/[facility] stating that F/C was inserted a couple of wks ago d/t difficulty turning & weaker.  She reports pt having ?bladder spasms b/c she is leaking around her cath & feeling pressure in her bladder at times.  Cath was flushed & it drains well yellow urine.  She is asking if script for pyridium can be called in.  Message to Dr [name].

## 2021-01-08 ENCOUNTER — Other Ambulatory Visit (HOSPITAL_COMMUNITY): Payer: Medicare Other

## 2021-01-08 NOTE — Telephone Encounter (Signed)
Patient is approved for Piqray at no charge from [employer] 01/05/21-11/24/21.  [employer] uses [facility], occupational by [place] [facility] Phone [phone] Fax [phone] 01/08/2021 10:51 AM

## 2021-01-08 NOTE — Telephone Encounter (Signed)
Please send it Thanks

## 2021-01-10 ENCOUNTER — Other Ambulatory Visit (HOSPITAL_COMMUNITY): Payer: Medicare Other

## 2021-01-10 NOTE — Telephone Encounter (Signed)
Noted that this message was just sent to RN/Ione [place] & was not taken care of.  Called Amanda/[facility] & she reports that Hospice Dr ordered antibiotic & pt has since not had bladder spasms so therefore no need for pyridium.

## 2021-01-12 ENCOUNTER — Ambulatory Visit: Admit: 2021-01-12 | Payer: Medicare Other | Admitting: Ophthalmology

## 2021-01-12 SURGERY — PHACOEMULSIFICATION, CATARACT, WITH IOL INSERTION
Anesthesia: Monitor Anesthesia Care | Laterality: Left

## 2021-01-25 ENCOUNTER — Other Ambulatory Visit: Payer: Medicare Other

## 2021-01-25 ENCOUNTER — Ambulatory Visit: Payer: Medicare Other | Admitting: Hematology and Oncology

## 2021-01-25 ENCOUNTER — Ambulatory Visit: Payer: Medicare Other

## 2021-02-23 DEATH — deceased

## 2021-09-26 ENCOUNTER — Other Ambulatory Visit (HOSPITAL_COMMUNITY): Payer: Self-pay

## 2022-12-24 IMAGING — DX DG ABD PORTABLE 1V
2 series · 2 of 2 positions shown · non-contrast
Comparison: CT abdomen pelvis dated 12/10/2020.

CLINICAL DATA: 79-year-old female with right-sided abdominal pain.

EXAM:
PORTABLE ABDOMEN - 1 VIEW

[abdomen kub (1 of 2)]
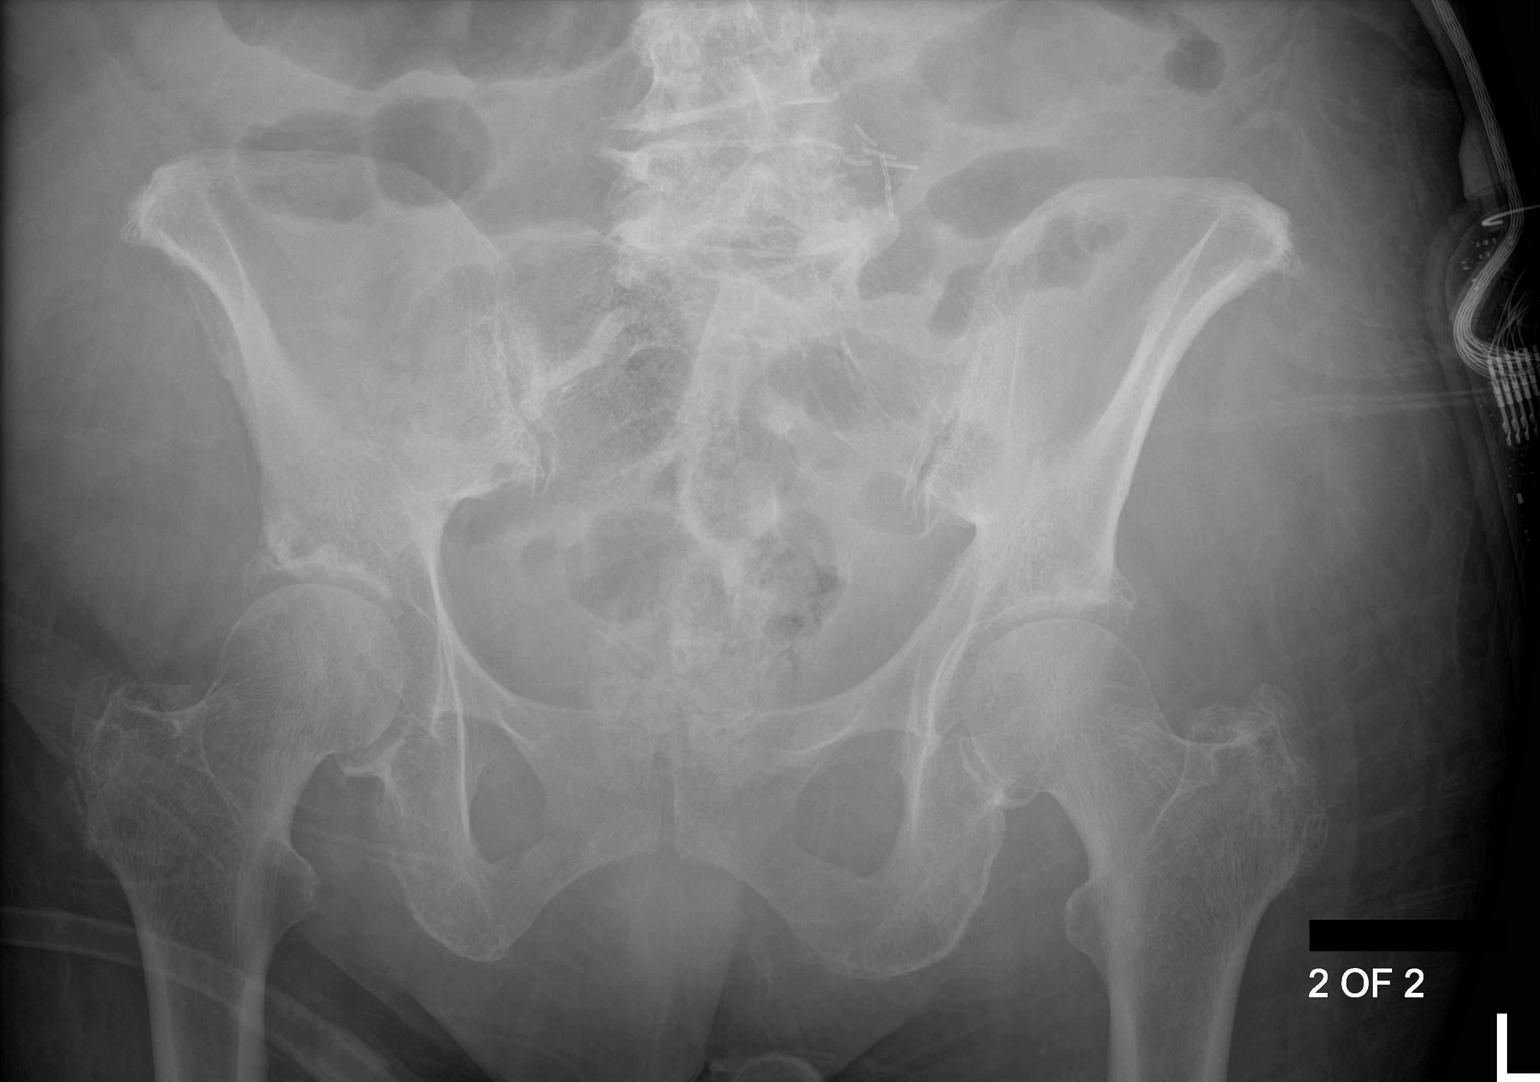

[abdomen kub (2 of 2)]
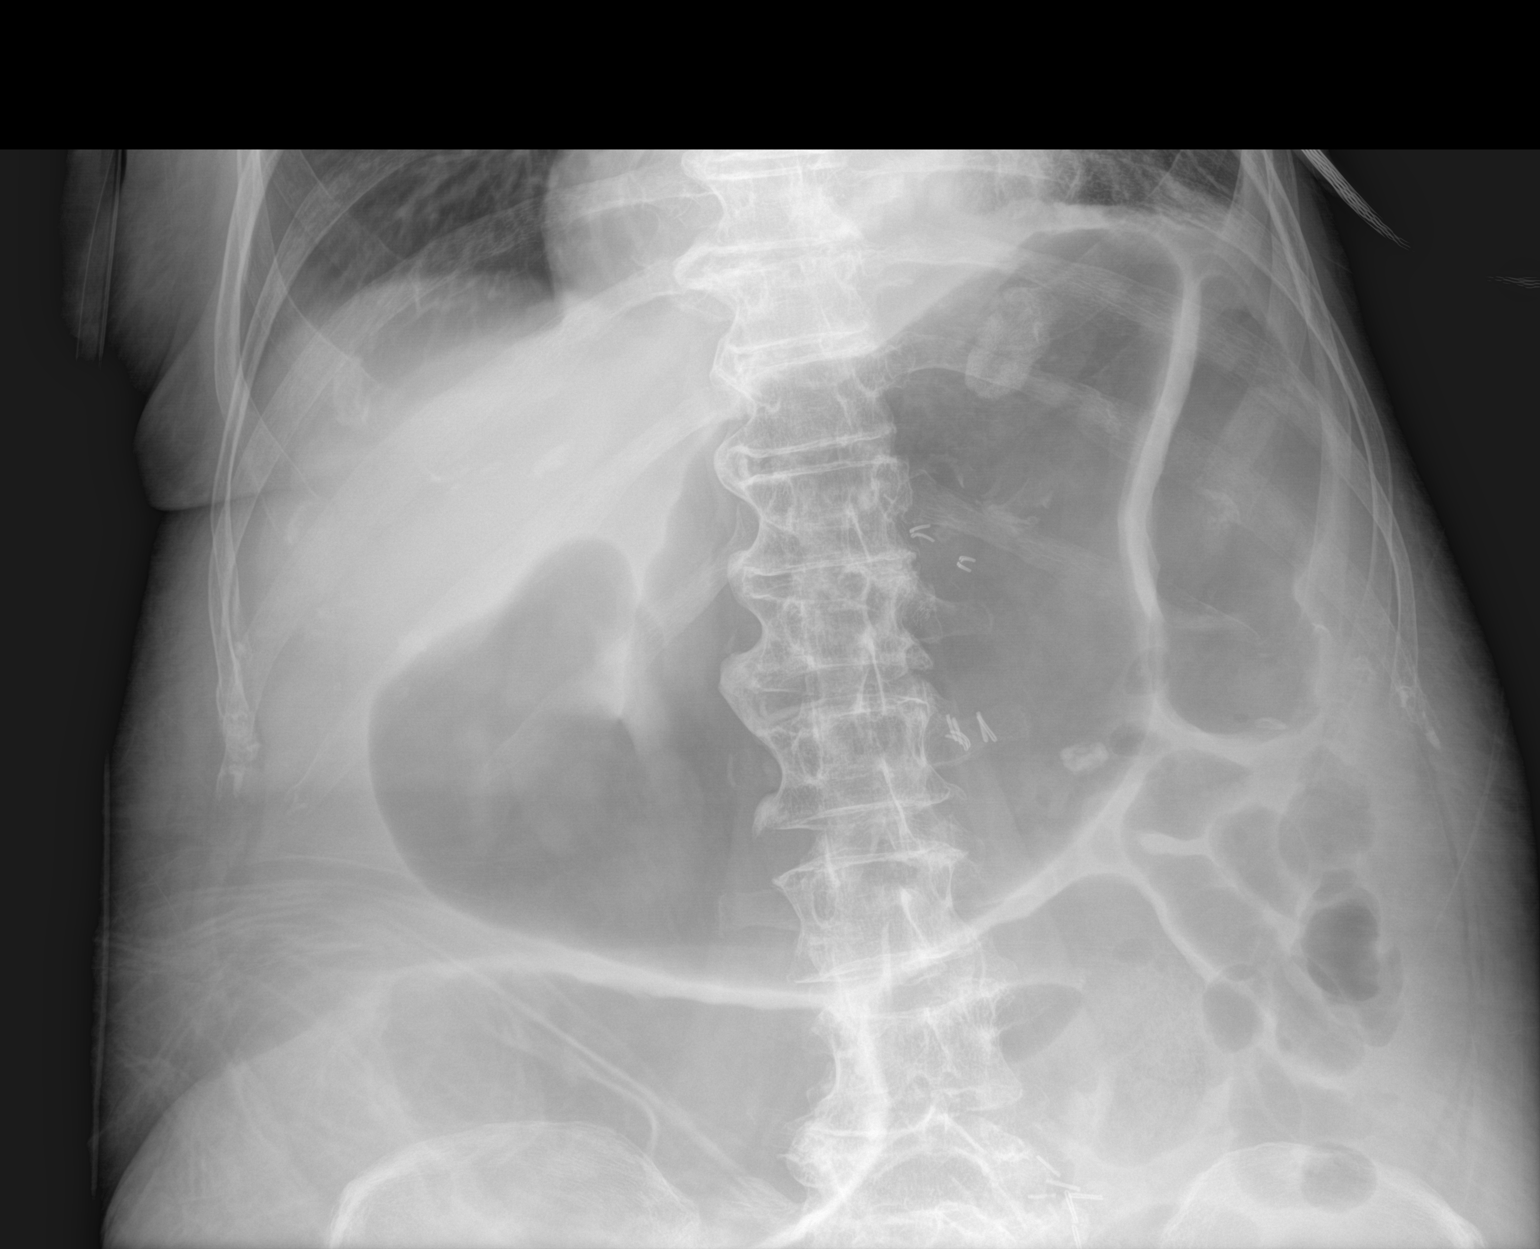

[2 of 2 positions shown; findings below may reference images not displayed]

FINDINGS: There is gaseous distension of the stomach. There is air distension
of the transverse colon and proximal descending colon as seen on the
prior CT. Air is noted within several nondistended loops of small
bowel. No dilated small bowel. No free air. Multiple surgical clips
noted over the abdomen. There is degenerative changes of the spine.
No acute osseous pathology.
IMPRESSION: 1. Gaseous distension of the stomach.
2. Air within the transverse and proximal descending colon as seen
on the prior CT.
3. No dilated small bowel loops.

## 2022-12-24 IMAGING — DX DG CHEST 2V
3 series · 3 of 3 positions shown · non-contrast
Comparison: Earlier radiograph dated 12/16/2020. CT dated
12/10/2020.

CLINICAL DATA: Seven 9-year-old female with shortness of breath and
abdominal pain.

EXAM:
CHEST - 2 VIEW

[chest lat (1 of 2)]
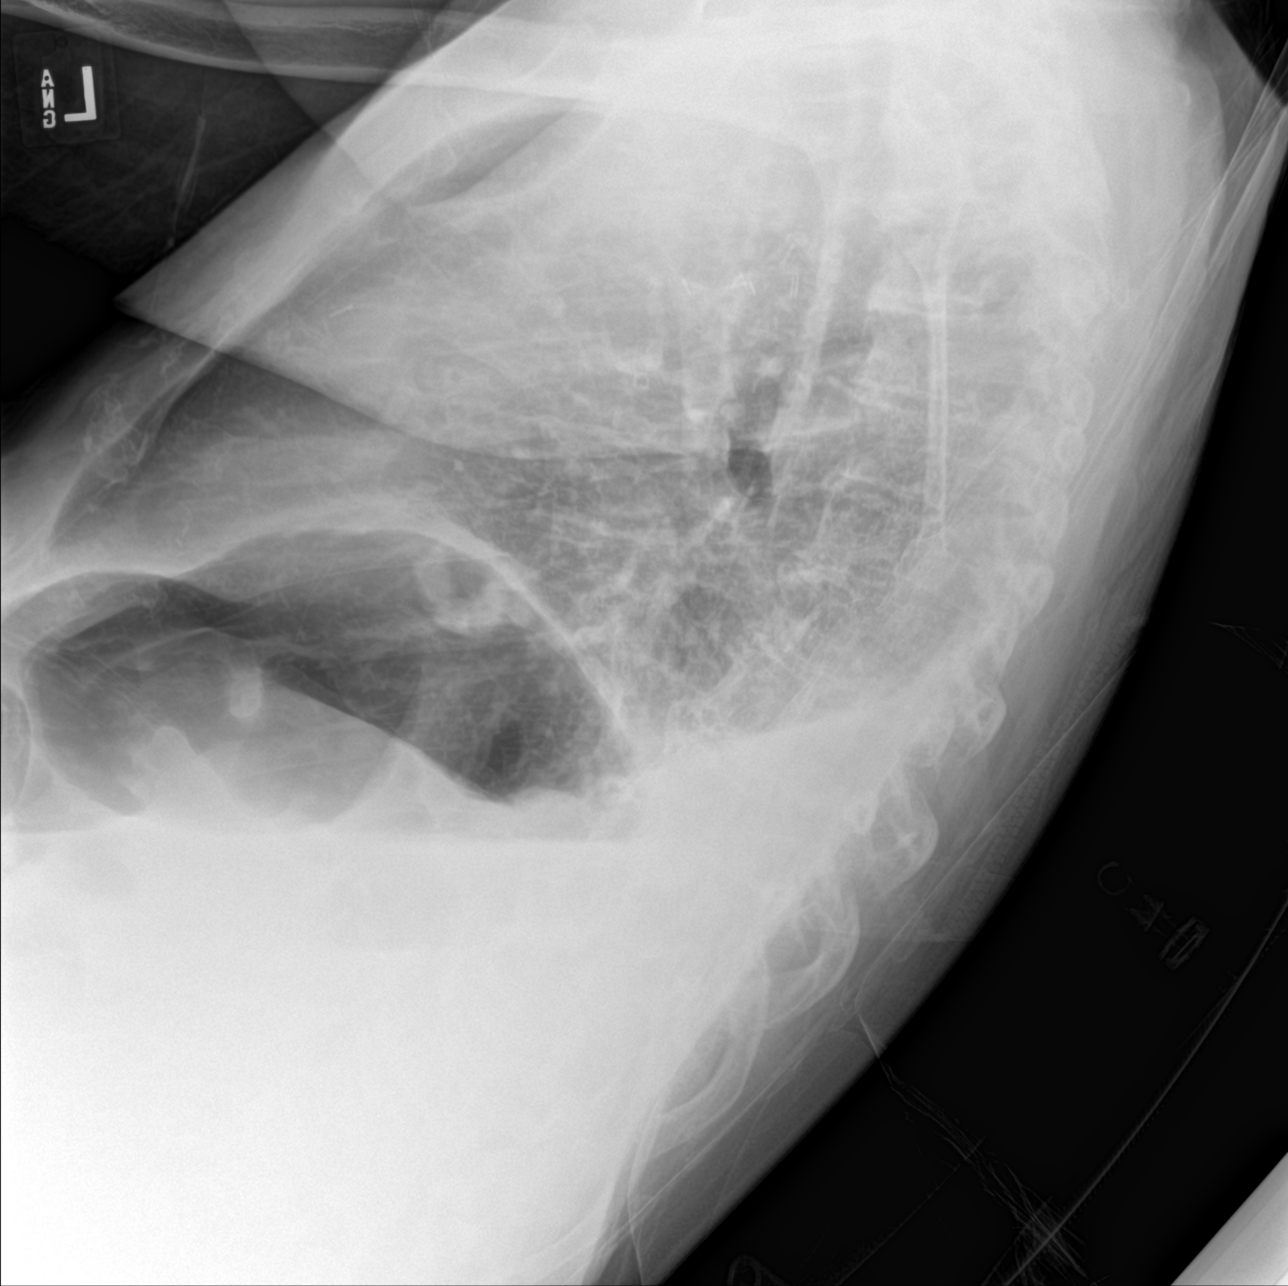

[chest ap]
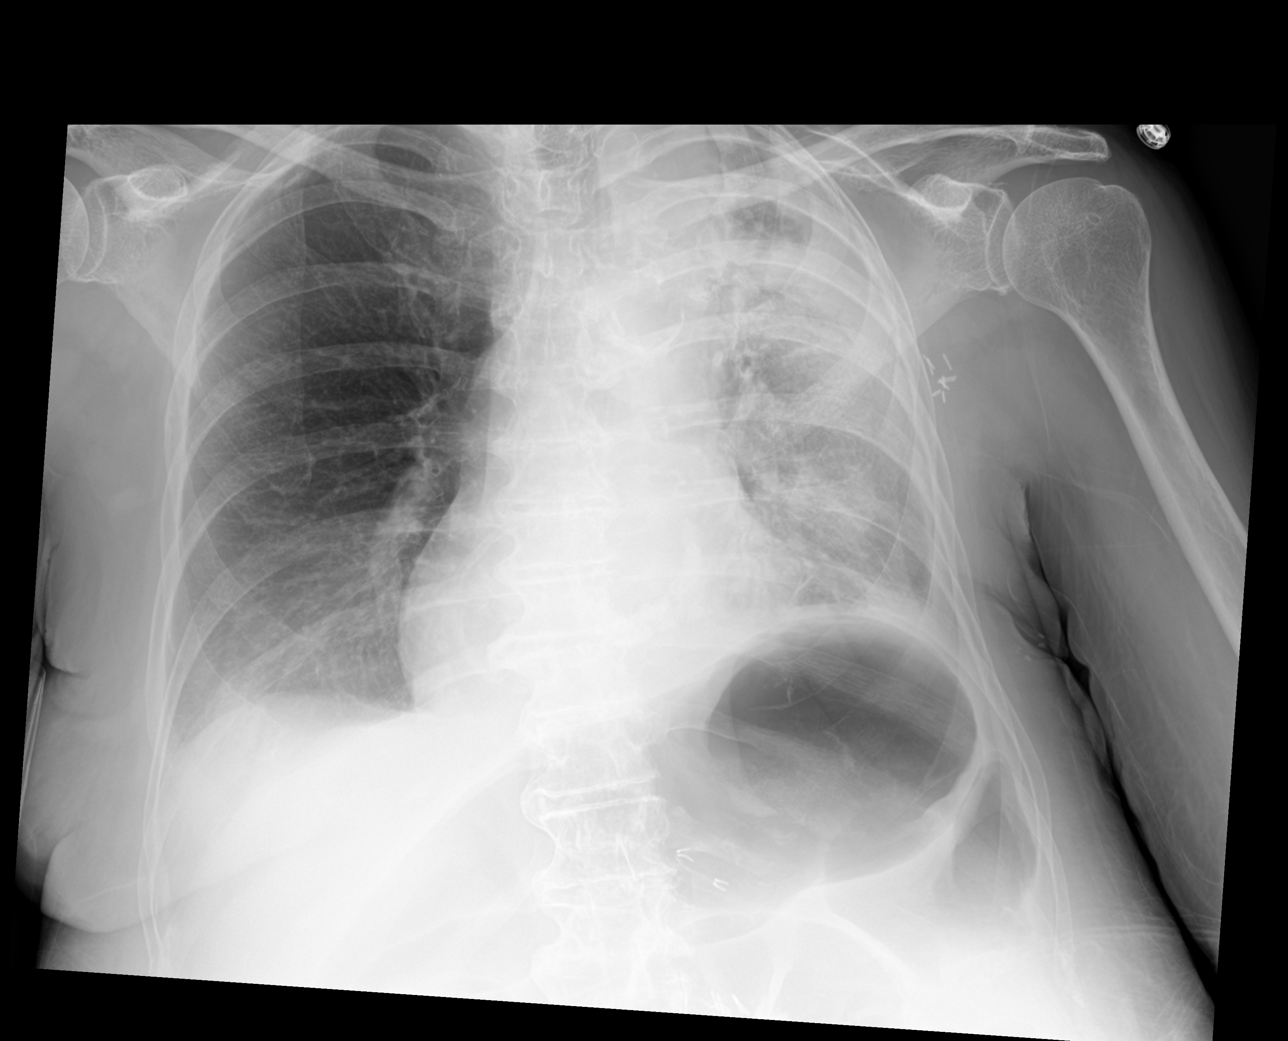

[chest lat (2 of 2)]
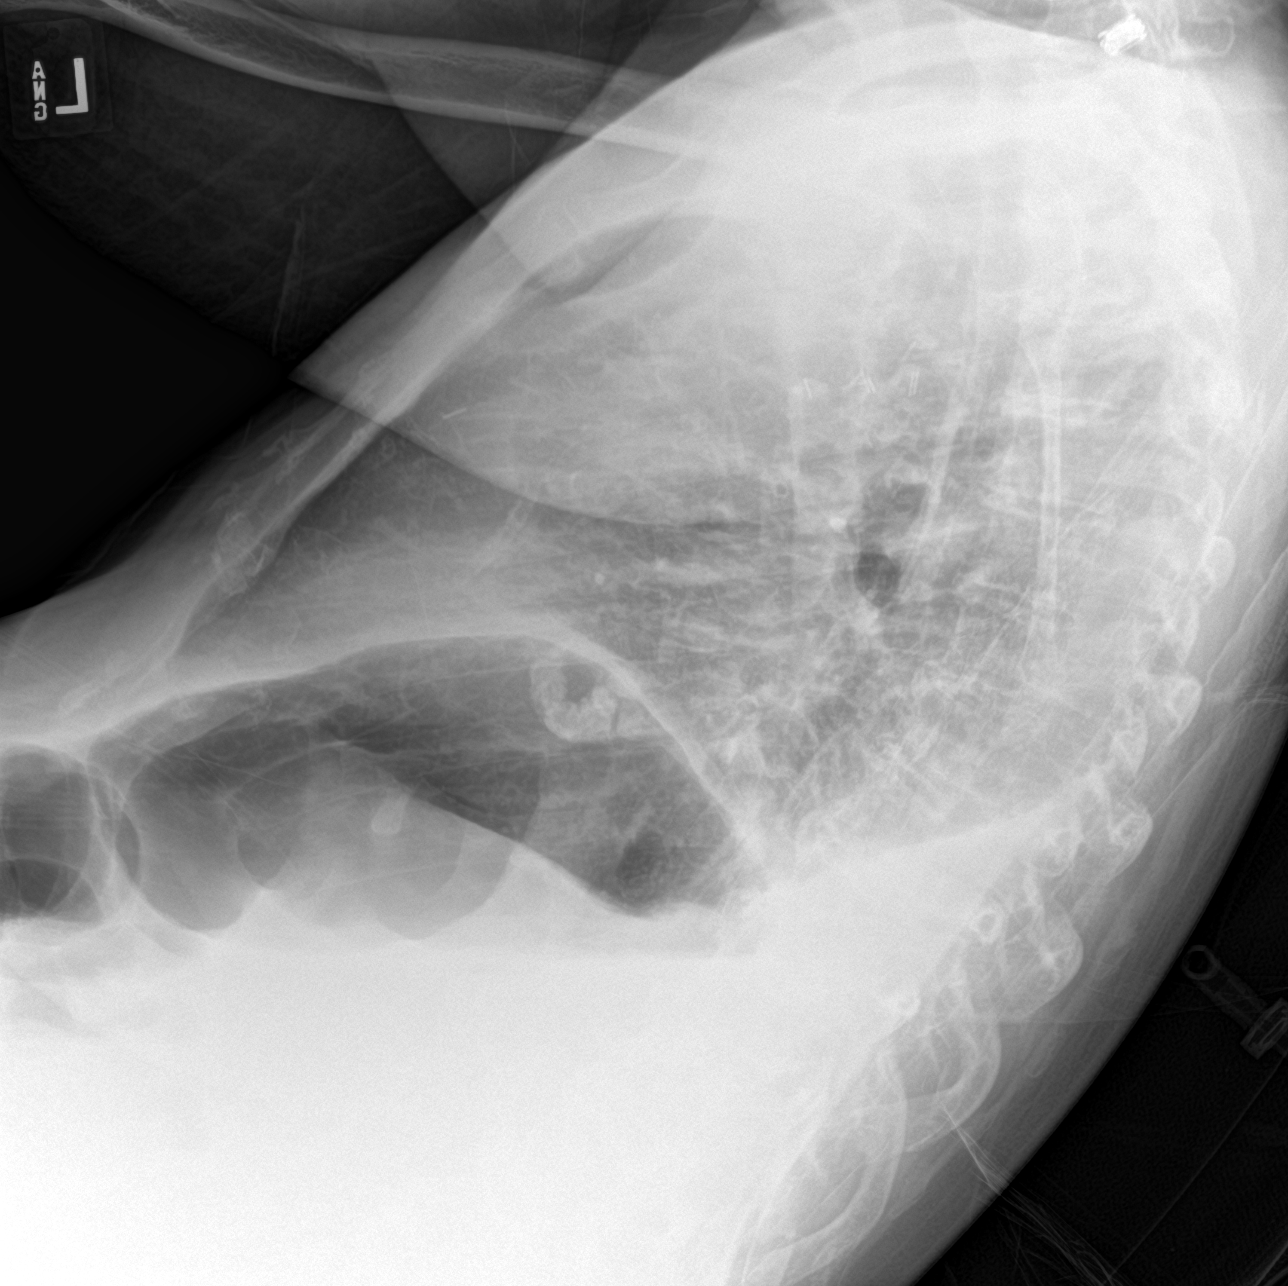

[3 of 3 positions shown; findings below may reference images not displayed]

FINDINGS: There is diffuse pleuroparenchymal opacification of the left lung as
seen on the earlier radiograph and corresponding to combination of
left pleural effusion and left apical consolidation seen on the
prior CT. A small right pleural effusion is suspected. No
pneumothorax. Stable cardiac silhouette. Atherosclerotic
calcification of the aorta. No acute osseous pathology.
IMPRESSION: 1. Diffuse pleuroparenchymal opacification of the left lung similar
to the earlier radiograph.
2. Probable small right pleural effusion.

## 2022-12-24 IMAGING — DX DG CHEST 1V PORT
1 series · 1 of 1 positions shown · non-contrast
Comparison: 12/10/2020 CT. Most recent plain film of the chest of
08/16/2019.

CLINICAL DATA: Shortness of breath. History of breast cancer with
metastasis.

EXAM:
PORTABLE CHEST 1 VIEW

[chest ap]
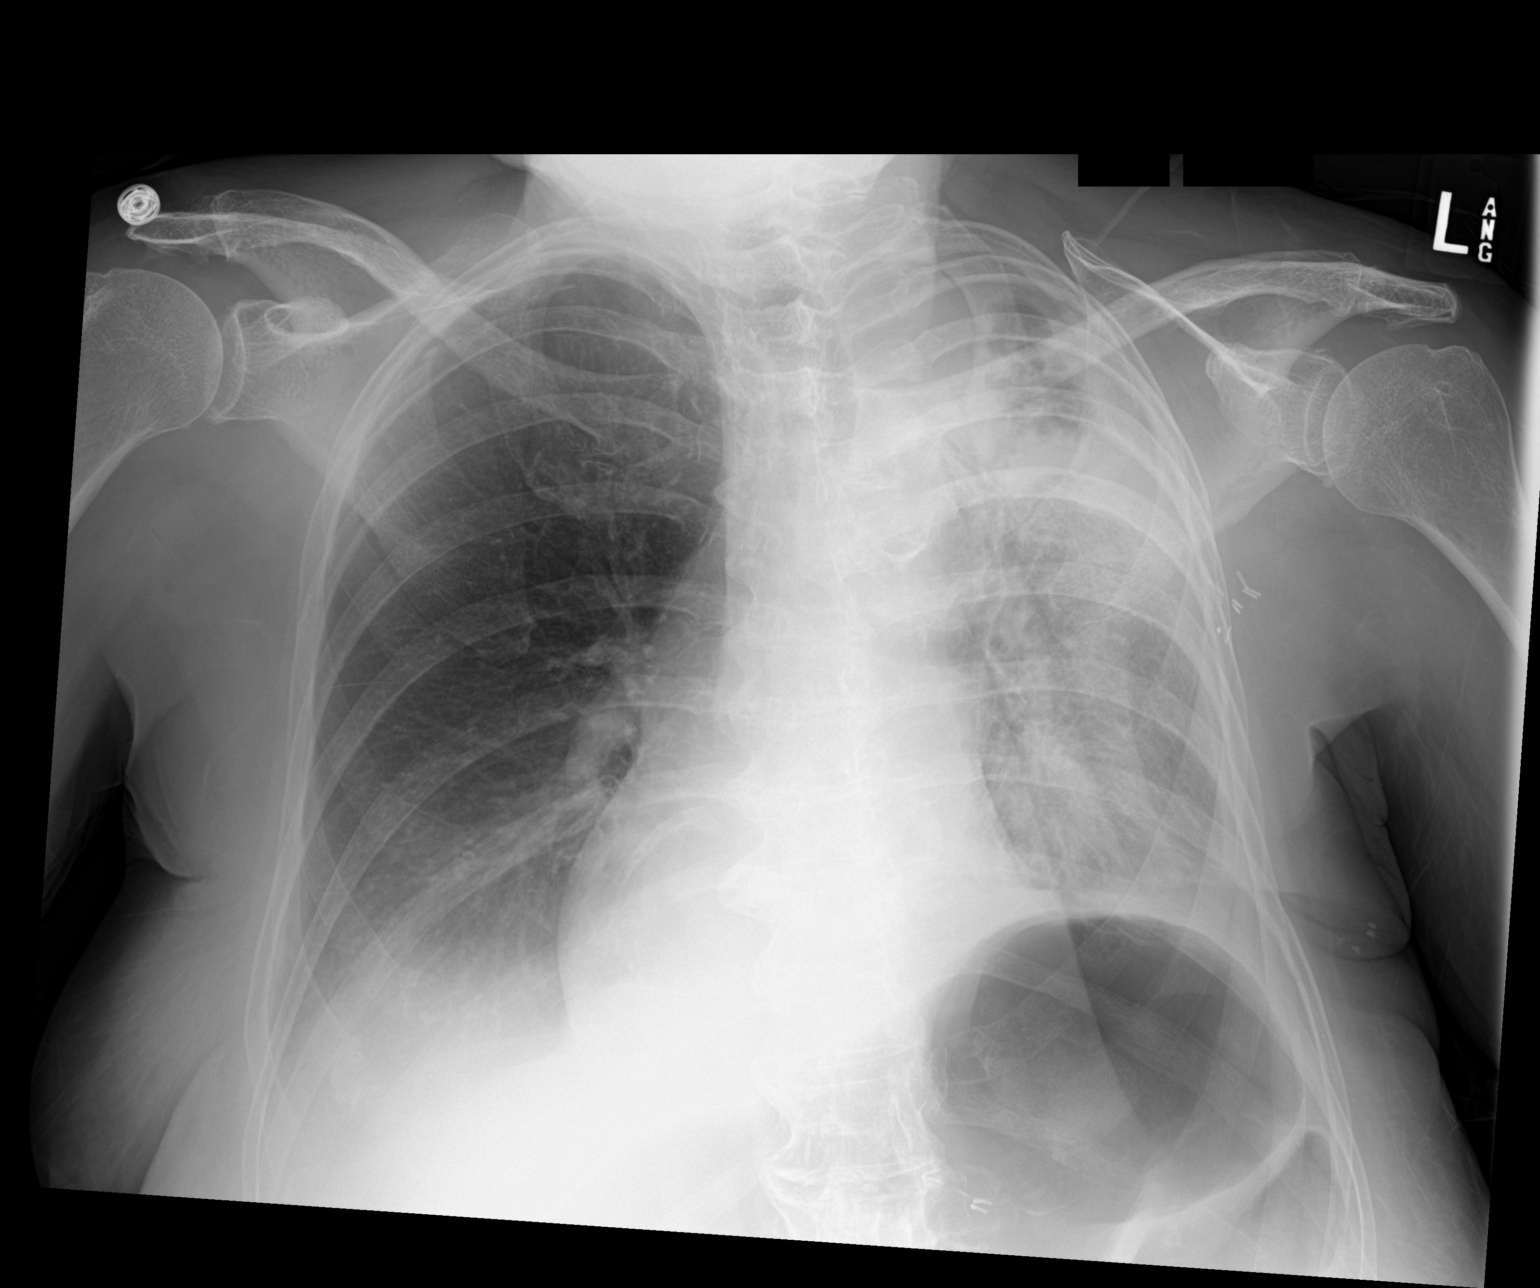

[1 of 1 positions shown; findings below may reference images not displayed]

FINDINGS: Mild tracheal deviation to the left, consistent with volume loss in
the left hemithorax. Normal heart size. Possible layering small
right pleural effusion. Pleuroparenchymal opacification throughout
the left hemithorax is slightly increased compared to 08/16/2019.
Aeration in the left lung base is felt to be worsened compared to
the CT of 12/10/2020, given cross modality comparison. Skin fold
artifact over the lower left chest. No pneumothorax. Left upper
quadrant gas is likely within the stomach. Left axillary node
dissection. Left mastectomy.
IMPRESSION: Chronic left hemithorax pleuroparenchymal opacification. Given cross
modality comparison, aeration at the left lung base may be worsened
compared to 12/10/2020. Cannot exclude superimposed atelectasis or
infection.

Possible layering right pleural effusion, new since the prior CT.

Consider PA and lateral radiographs if possible.
